# Patient Record
Sex: Male | Born: 1955
Health system: Southern US, Community
[De-identification: ages and names within clinical notes are randomized; demographics above are authoritative.]

## PROBLEM LIST (undated history)

## (undated) DIAGNOSIS — F419 Anxiety disorder, unspecified: Secondary | ICD-10-CM

## (undated) DIAGNOSIS — M5136 Other intervertebral disc degeneration, lumbar region: Secondary | ICD-10-CM

## (undated) DIAGNOSIS — I471 Supraventricular tachycardia, unspecified: Secondary | ICD-10-CM

## (undated) DIAGNOSIS — N183 Chronic kidney disease, stage 3 unspecified: Secondary | ICD-10-CM

## (undated) DIAGNOSIS — N19 Unspecified kidney failure: Secondary | ICD-10-CM

## (undated) DIAGNOSIS — E785 Hyperlipidemia, unspecified: Secondary | ICD-10-CM

## (undated) DIAGNOSIS — M5126 Other intervertebral disc displacement, lumbar region: Secondary | ICD-10-CM

## (undated) DIAGNOSIS — I255 Ischemic cardiomyopathy: Secondary | ICD-10-CM

## (undated) DIAGNOSIS — I472 Ventricular tachycardia, unspecified: Secondary | ICD-10-CM

## (undated) DIAGNOSIS — I5042 Chronic combined systolic (congestive) and diastolic (congestive) heart failure: Secondary | ICD-10-CM

## (undated) DIAGNOSIS — I82409 Acute embolism and thrombosis of unspecified deep veins of unspecified lower extremity: Secondary | ICD-10-CM

## (undated) DIAGNOSIS — I2510 Atherosclerotic heart disease of native coronary artery without angina pectoris: Secondary | ICD-10-CM

## (undated) DIAGNOSIS — M5489 Other dorsalgia: Secondary | ICD-10-CM

## (undated) DIAGNOSIS — R571 Hypovolemic shock: Secondary | ICD-10-CM

## (undated) DIAGNOSIS — M51369 Other intervertebral disc degeneration, lumbar region without mention of lumbar back pain or lower extremity pain: Secondary | ICD-10-CM

## (undated) DIAGNOSIS — M069 Rheumatoid arthritis, unspecified: Secondary | ICD-10-CM

## (undated) DIAGNOSIS — G4730 Sleep apnea, unspecified: Secondary | ICD-10-CM

## (undated) DIAGNOSIS — L0390 Cellulitis, unspecified: Secondary | ICD-10-CM

## (undated) DIAGNOSIS — M2550 Pain in unspecified joint: Secondary | ICD-10-CM

## (undated) DIAGNOSIS — L239 Allergic contact dermatitis, unspecified cause: Secondary | ICD-10-CM

## (undated) DIAGNOSIS — A938 Other specified arthropod-borne viral fevers: Secondary | ICD-10-CM

## (undated) DIAGNOSIS — L0291 Cutaneous abscess, unspecified: Secondary | ICD-10-CM

## (undated) DIAGNOSIS — Z95810 Presence of automatic (implantable) cardiac defibrillator: Secondary | ICD-10-CM

## (undated) DIAGNOSIS — A419 Sepsis, unspecified organism: Secondary | ICD-10-CM

## (undated) DIAGNOSIS — I219 Acute myocardial infarction, unspecified: Secondary | ICD-10-CM

## (undated) DIAGNOSIS — M79606 Pain in leg, unspecified: Secondary | ICD-10-CM

## (undated) DIAGNOSIS — G8929 Other chronic pain: Secondary | ICD-10-CM

## (undated) DIAGNOSIS — M5416 Radiculopathy, lumbar region: Secondary | ICD-10-CM

## (undated) DIAGNOSIS — Z8739 Personal history of other diseases of the musculoskeletal system and connective tissue: Secondary | ICD-10-CM

## (undated) DIAGNOSIS — G4734 Idiopathic sleep related nonobstructive alveolar hypoventilation: Secondary | ICD-10-CM

## (undated) DIAGNOSIS — I10 Essential (primary) hypertension: Secondary | ICD-10-CM

## (undated) DIAGNOSIS — M549 Dorsalgia, unspecified: Secondary | ICD-10-CM

## (undated) DIAGNOSIS — Z9289 Personal history of other medical treatment: Secondary | ICD-10-CM

## (undated) HISTORY — DX: Allergic contact dermatitis, unspecified cause: L23.9

## (undated) HISTORY — DX: Ventricular tachycardia, unspecified: I47.20

## (undated) HISTORY — DX: Pain in leg, unspecified: M79.606

## (undated) HISTORY — DX: Other dorsalgia: M54.89

## (undated) HISTORY — DX: Chronic combined systolic (congestive) and diastolic (congestive) heart failure: I50.42

## (undated) HISTORY — DX: Other chronic pain: G89.29

## (undated) HISTORY — DX: Unspecified kidney failure: N19

## (undated) HISTORY — DX: Pain in unspecified joint: M25.50

## (undated) HISTORY — PX: FRACTURE SURGERY: SHX138

## (undated) HISTORY — DX: Chronic kidney disease, stage 3 unspecified: N18.30

## (undated) HISTORY — PX: CHOLECYSTECTOMY OPEN: SUR202

## (undated) HISTORY — DX: Ischemic cardiomyopathy: I25.5

## (undated) HISTORY — DX: Ventricular tachycardia: I47.2

## (undated) HISTORY — DX: Chronic kidney disease, stage 3 (moderate): N18.3

## (undated) HISTORY — DX: Radiculopathy, lumbar region: M54.16

## (undated) HISTORY — DX: Sepsis, unspecified organism: A41.9

## (undated) HISTORY — PX: INGUINAL HERNIA REPAIR: SUR1180

## (undated) HISTORY — DX: Dorsalgia, unspecified: M54.9

## (undated) HISTORY — DX: Idiopathic sleep related nonobstructive alveolar hypoventilation: G47.34

## (undated) HISTORY — DX: Hypovolemic shock: R57.1

## (undated) HISTORY — DX: Other specified arthropod-borne viral fevers: A93.8

## (undated) HISTORY — DX: Hyperlipidemia, unspecified: E78.5

---

## 1984-08-03 DIAGNOSIS — Z9289 Personal history of other medical treatment: Secondary | ICD-10-CM

## 1984-08-03 HISTORY — PX: VASCULAR SURGERY: SHX849

## 1984-08-03 HISTORY — PX: TIBIA FRACTURE SURGERY: SHX806

## 1984-08-03 HISTORY — PX: SKIN GRAFT: SHX250

## 1984-08-03 HISTORY — DX: Personal history of other medical treatment: Z92.89

## 1984-08-03 HISTORY — PX: MANDIBLE FRACTURE SURGERY: SHX706

## 2003-08-04 DIAGNOSIS — N183 Chronic kidney disease, stage 3 unspecified: Secondary | ICD-10-CM | POA: Insufficient documentation

## 2003-11-09 DIAGNOSIS — J309 Allergic rhinitis, unspecified: Secondary | ICD-10-CM | POA: Insufficient documentation

## 2004-01-09 DIAGNOSIS — I10 Essential (primary) hypertension: Secondary | ICD-10-CM | POA: Insufficient documentation

## 2005-02-07 ENCOUNTER — Emergency Department: Payer: Self-pay | Admitting: Emergency Medicine

## 2005-02-08 ENCOUNTER — Emergency Department: Payer: Self-pay | Admitting: Internal Medicine

## 2006-11-06 ENCOUNTER — Inpatient Hospital Stay: Payer: Self-pay | Admitting: Internal Medicine

## 2008-02-20 DIAGNOSIS — E782 Mixed hyperlipidemia: Secondary | ICD-10-CM | POA: Insufficient documentation

## 2009-01-08 ENCOUNTER — Emergency Department: Payer: Self-pay | Admitting: Unknown Physician Specialty

## 2009-01-12 DIAGNOSIS — A938 Other specified arthropod-borne viral fevers: Secondary | ICD-10-CM

## 2009-01-12 DIAGNOSIS — A419 Sepsis, unspecified organism: Secondary | ICD-10-CM | POA: Insufficient documentation

## 2009-01-12 HISTORY — DX: Other specified arthropod-borne viral fevers: A93.8

## 2009-06-03 DIAGNOSIS — F411 Generalized anxiety disorder: Secondary | ICD-10-CM | POA: Insufficient documentation

## 2011-04-26 ENCOUNTER — Emergency Department: Payer: Self-pay | Admitting: Unknown Physician Specialty

## 2011-08-04 DIAGNOSIS — I219 Acute myocardial infarction, unspecified: Secondary | ICD-10-CM

## 2011-08-04 HISTORY — DX: Acute myocardial infarction, unspecified: I21.9

## 2011-08-04 HISTORY — PX: CORONARY ANGIOPLASTY WITH STENT PLACEMENT: SHX49

## 2011-08-10 ENCOUNTER — Encounter (HOSPITAL_COMMUNITY)
Admission: EM | Disposition: A | Discharge status: Home / Self Care | Payer: Self-pay | Source: Home / Self Care | Attending: Cardiology

## 2011-08-10 ENCOUNTER — Emergency Department (HOSPITAL_COMMUNITY): Payer: Self-pay

## 2011-08-10 ENCOUNTER — Other Ambulatory Visit: Payer: Self-pay

## 2011-08-10 ENCOUNTER — Encounter (HOSPITAL_COMMUNITY): Payer: Self-pay | Admitting: Nurse Practitioner

## 2011-08-10 ENCOUNTER — Inpatient Hospital Stay (HOSPITAL_COMMUNITY)
Admission: EM | Admit: 2011-08-10 | Discharge: 2011-08-14 | DRG: 249 | Disposition: A | Discharge status: Home / Self Care | Payer: 59 | Attending: Cardiology | Admitting: Cardiology

## 2011-08-10 DIAGNOSIS — I25810 Atherosclerosis of coronary artery bypass graft(s) without angina pectoris: Secondary | ICD-10-CM | POA: Insufficient documentation

## 2011-08-10 DIAGNOSIS — I2510 Atherosclerotic heart disease of native coronary artery without angina pectoris: Secondary | ICD-10-CM | POA: Diagnosis present

## 2011-08-10 DIAGNOSIS — I2119 ST elevation (STEMI) myocardial infarction involving other coronary artery of inferior wall: Secondary | ICD-10-CM | POA: Diagnosis present

## 2011-08-10 DIAGNOSIS — K219 Gastro-esophageal reflux disease without esophagitis: Secondary | ICD-10-CM

## 2011-08-10 DIAGNOSIS — N289 Disorder of kidney and ureter, unspecified: Secondary | ICD-10-CM | POA: Diagnosis present

## 2011-08-10 DIAGNOSIS — E7800 Pure hypercholesterolemia, unspecified: Secondary | ICD-10-CM | POA: Diagnosis present

## 2011-08-10 DIAGNOSIS — I10 Essential (primary) hypertension: Secondary | ICD-10-CM | POA: Diagnosis present

## 2011-08-10 DIAGNOSIS — I213 ST elevation (STEMI) myocardial infarction of unspecified site: Secondary | ICD-10-CM

## 2011-08-10 HISTORY — PX: LEFT HEART CATHETERIZATION WITH CORONARY ANGIOGRAM: SHX5451

## 2011-08-10 HISTORY — PX: PERCUTANEOUS CORONARY STENT INTERVENTION (PCI-S): SHX5485

## 2011-08-10 HISTORY — DX: Atherosclerotic heart disease of native coronary artery without angina pectoris: I25.10

## 2011-08-10 LAB — PROTIME-INR
INR: 0.92 (ref 0.00–1.49)
Prothrombin Time: 12.6 seconds (ref 11.6–15.2)
Prothrombin Time: 54.3 seconds — ABNORMAL HIGH (ref 11.6–15.2)

## 2011-08-10 LAB — POCT I-STAT, CHEM 8
BUN: 29 mg/dL — ABNORMAL HIGH (ref 6–23)
Calcium, Ion: 1.16 mmol/L (ref 1.12–1.32)
Chloride: 110 mEq/L (ref 96–112)
HCT: 48 % (ref 39.0–52.0)
Potassium: 6.6 mEq/L (ref 3.5–5.1)

## 2011-08-10 LAB — COMPREHENSIVE METABOLIC PANEL WITH GFR
AST: 48 U/L — ABNORMAL HIGH (ref 0–37)
Albumin: 4.2 g/dL (ref 3.5–5.2)
Chloride: 105 meq/L (ref 96–112)
Creatinine, Ser: 1.64 mg/dL — ABNORMAL HIGH (ref 0.50–1.35)
Total Bilirubin: 0.7 mg/dL (ref 0.3–1.2)
Total Protein: 7.8 g/dL (ref 6.0–8.3)

## 2011-08-10 LAB — COMPREHENSIVE METABOLIC PANEL
ALT: 26 U/L (ref 0–53)
ALT: 17 U/L (ref 0–53)
AST: 19 U/L (ref 0–37)
Albumin: 3.3 g/dL — ABNORMAL LOW (ref 3.5–5.2)
Alkaline Phosphatase: 68 U/L (ref 39–117)
Alkaline Phosphatase: 63 U/L (ref 39–117)
BUN: 18 mg/dL (ref 6–23)
CO2: 26 mEq/L (ref 19–32)
Calcium: 9.6 mg/dL (ref 8.4–10.5)
Chloride: 108 mEq/L (ref 96–112)
GFR calc Af Amer: 53 mL/min — ABNORMAL LOW (ref >90)
GFR calc non Af Amer: 46 mL/min — ABNORMAL LOW (ref >90)
Glucose, Bld: 95 mg/dL (ref 70–99)
Potassium: 6.4 mEq/L (ref 3.5–5.1)
Potassium: 3.8 mEq/L (ref 3.5–5.1)
Sodium: 141 mEq/L (ref 135–145)
Sodium: 141 mEq/L (ref 135–145)
Total Bilirubin: 0.6 mg/dL (ref 0.3–1.2)
Total Protein: 6.3 g/dL (ref 6.0–8.3)

## 2011-08-10 LAB — CBC
HCT: 44.8 % (ref 39.0–52.0)
HCT: 38.1 % — ABNORMAL LOW (ref 39.0–52.0)
Hemoglobin: 15.7 g/dL (ref 13.0–17.0)
MCH: 33.3 pg (ref 26.0–34.0)
MCHC: 35 g/dL (ref 30.0–36.0)
MCHC: 34.6 g/dL (ref 30.0–36.0)
MCV: 95.1 fL (ref 78.0–100.0)
Platelets: 182 K/uL (ref 150–400)
Platelets: 158 10*3/uL (ref 150–400)
RBC: 4.71 MIL/uL (ref 4.22–5.81)
RDW: 12.9 % (ref 11.5–15.5)
RDW: 12.8 % (ref 11.5–15.5)
WBC: 12.5 K/uL — ABNORMAL HIGH (ref 4.0–10.5)
WBC: 11.3 10*3/uL — ABNORMAL HIGH (ref 4.0–10.5)

## 2011-08-10 LAB — MRSA PCR SCREENING: MRSA by PCR: NEGATIVE

## 2011-08-10 LAB — POCT I-STAT TROPONIN I: Troponin i, poc: 0.1 ng/mL (ref 0.00–0.08)

## 2011-08-10 LAB — CARDIAC PANEL(CRET KIN+CKTOT+MB+TROPI)
Relative Index: 5.4 — ABNORMAL HIGH (ref 0.0–2.5)
Relative Index: 14.2 — ABNORMAL HIGH (ref 0.0–2.5)
Total CK: 141 U/L (ref 7–232)
Troponin I: 4.21 ng/mL (ref <0.30)

## 2011-08-10 LAB — APTT: aPTT: 32 s (ref 24–37)

## 2011-08-10 LAB — LIPID PANEL
HDL: 39 mg/dL — ABNORMAL LOW (ref >39)
LDL Cholesterol: 161 mg/dL — ABNORMAL HIGH (ref 0–99)
Total CHOL/HDL Ratio: 5.6 RATIO
Triglycerides: 95 mg/dL (ref <150)

## 2011-08-10 LAB — TSH: TSH: 1.52 u[IU]/mL (ref <=5.90)

## 2011-08-10 LAB — MAGNESIUM: Magnesium: 1.4 mg/dL — ABNORMAL LOW (ref 1.5–2.5)

## 2011-08-10 SURGERY — PERCUTANEOUS CORONARY STENT INTERVENTION (PCI-S)
Anesthesia: LOCAL

## 2011-08-10 MED ORDER — HYDROCODONE-ACETAMINOPHEN 5-325 MG PO TABS
ORAL_TABLET | ORAL | Status: AC
  Filled 2011-08-10: qty 1

## 2011-08-10 MED ORDER — HEPARIN SODIUM (PORCINE) 1000 UNIT/ML IJ SOLN
INTRAMUSCULAR | Status: AC
  Filled 2011-08-10: qty 1

## 2011-08-10 MED ORDER — DIAZEPAM 5 MG PO TABS: 5.0000 mg | ORAL_TABLET | Freq: Four times a day (QID) | ORAL | Status: DC | PRN

## 2011-08-10 MED ORDER — ASPIRIN 325 MG PO TABS: 325.0000 mg | ORAL_TABLET | Freq: Every day | ORAL | Status: DC

## 2011-08-10 MED ORDER — SODIUM CHLORIDE 0.9 % IV SOLN: INTRAVENOUS | Status: AC

## 2011-08-10 MED ORDER — SODIUM CHLORIDE 0.9 % IV SOLN: 30.0000 mL | INTRAVENOUS | Status: DC

## 2011-08-10 MED ORDER — BIVALIRUDIN 250 MG IV SOLR
INTRAVENOUS | Status: AC
  Filled 2011-08-10: qty 250

## 2011-08-10 MED ORDER — FENTANYL CITRATE 0.05 MG/ML IJ SOLN: 25.0000 ug | Freq: Once | INTRAMUSCULAR | Status: DC

## 2011-08-10 MED ORDER — ASPIRIN 81 MG PO CHEW
CHEWABLE_TABLET | ORAL | Status: AC
  Administered 2011-08-10: 325 mg via ORAL
  Filled 2011-08-10: qty 4

## 2011-08-10 MED ORDER — ZOLPIDEM TARTRATE 5 MG PO TABS: 10.0000 mg | ORAL_TABLET | Freq: Every evening | ORAL | Status: DC | PRN

## 2011-08-10 MED ORDER — FENTANYL CITRATE 0.05 MG/ML IJ SOLN
INTRAMUSCULAR | Status: AC
  Filled 2011-08-10: qty 2

## 2011-08-10 MED ORDER — NITROGLYCERIN 2 % TD OINT
TOPICAL_OINTMENT | TRANSDERMAL | Status: AC
  Filled 2011-08-10: qty 1

## 2011-08-10 MED ORDER — MIDAZOLAM HCL 2 MG/2ML IJ SOLN
INTRAMUSCULAR | Status: AC
  Filled 2011-08-10: qty 2

## 2011-08-10 MED ORDER — ALPRAZOLAM 0.25 MG PO TABS
0.2500 mg | ORAL_TABLET | Freq: Two times a day (BID) | ORAL | Status: DC | PRN
  Administered 2011-08-11 – 2011-08-12 (×3): 0.25 mg via ORAL
  Filled 2011-08-10 (×4): qty 1

## 2011-08-10 MED ORDER — NITROGLYCERIN 0.4 MG SL SUBL
0.4000 mg | SUBLINGUAL_TABLET | SUBLINGUAL | Status: DC | PRN
  Administered 2011-08-11 (×2): 0.4 mg via SUBLINGUAL
  Filled 2011-08-10: qty 50
  Filled 2011-08-10: qty 25

## 2011-08-10 MED ORDER — ONDANSETRON HCL 4 MG/2ML IJ SOLN
4.0000 mg | Freq: Four times a day (QID) | INTRAMUSCULAR | Status: DC | PRN
  Administered 2011-08-10: 4 mg via INTRAVENOUS
  Filled 2011-08-10: qty 2

## 2011-08-10 MED ORDER — VERAPAMIL HCL 2.5 MG/ML IV SOLN
INTRAVENOUS | Status: AC
  Filled 2011-08-10: qty 2

## 2011-08-10 MED ORDER — NITROGLYCERIN 0.4 MG SL SUBL
SUBLINGUAL_TABLET | SUBLINGUAL | Status: AC
  Administered 2011-08-11: 0.4 mg via SUBLINGUAL
  Filled 2011-08-10: qty 75

## 2011-08-10 MED ORDER — ZOLPIDEM TARTRATE 5 MG PO TABS
5.0000 mg | ORAL_TABLET | Freq: Every evening | ORAL | Status: DC | PRN
  Administered 2011-08-11 – 2011-08-12 (×3): 5 mg via ORAL
  Filled 2011-08-10 (×3): qty 1

## 2011-08-10 MED ORDER — ONDANSETRON HCL 4 MG/2ML IJ SOLN: 4.0000 mg | Freq: Four times a day (QID) | INTRAMUSCULAR | Status: DC | PRN

## 2011-08-10 MED ORDER — NITROGLYCERIN IN D5W 200-5 MCG/ML-% IV SOLN: 2.0000 ug/min | INTRAVENOUS | Status: DC

## 2011-08-10 MED ORDER — METOPROLOL TARTRATE 12.5 MG HALF TABLET
12.5000 mg | ORAL_TABLET | Freq: Two times a day (BID) | ORAL | Status: DC
  Administered 2011-08-10 – 2011-08-12 (×5): 12.5 mg via ORAL
  Filled 2011-08-10 (×7): qty 1

## 2011-08-10 MED ORDER — HYDROCODONE-ACETAMINOPHEN 5-325 MG PO TABS
2.0000 | ORAL_TABLET | Freq: Four times a day (QID) | ORAL | Status: DC | PRN
  Administered 2011-08-10: 1 via ORAL
  Administered 2011-08-11 – 2011-08-12 (×6): 2 via ORAL
  Filled 2011-08-10 (×6): qty 2

## 2011-08-10 MED ORDER — ROSUVASTATIN CALCIUM 40 MG PO TABS
40.0000 mg | ORAL_TABLET | Freq: Every day | ORAL | Status: DC
  Administered 2011-08-10 – 2011-08-14 (×5): 40 mg via ORAL
  Filled 2011-08-10 (×5): qty 1

## 2011-08-10 MED ORDER — ACETAMINOPHEN 325 MG PO TABS: 650.0000 mg | ORAL_TABLET | ORAL | Status: DC | PRN

## 2011-08-10 MED ORDER — HEPARIN SODIUM (PORCINE) 5000 UNIT/ML IJ SOLN
INTRAMUSCULAR | Status: AC
  Administered 2011-08-10: 4000 [IU]
  Filled 2011-08-10: qty 1

## 2011-08-10 MED ORDER — SODIUM CHLORIDE 0.9 % IV SOLN: 250.0000 mL | INTRAVENOUS | Status: DC | PRN

## 2011-08-10 MED ORDER — HEPARIN SODIUM (PORCINE) 5000 UNIT/ML IJ SOLN: 60.0000 [IU]/kg | INTRAMUSCULAR | Status: DC

## 2011-08-10 MED ORDER — HEPARIN (PORCINE) IN NACL 2-0.9 UNIT/ML-% IJ SOLN
INTRAMUSCULAR | Status: AC
  Filled 2011-08-10: qty 2000

## 2011-08-10 MED ORDER — PRASUGREL HCL 10 MG PO TABS
ORAL_TABLET | ORAL | Status: AC
  Filled 2011-08-10: qty 6

## 2011-08-10 MED ORDER — LIDOCAINE HCL (PF) 1 % IJ SOLN
INTRAMUSCULAR | Status: AC
  Filled 2011-08-10: qty 30

## 2011-08-10 MED ORDER — PRASUGREL HCL 10 MG PO TABS
10.0000 mg | ORAL_TABLET | Freq: Every day | ORAL | Status: DC
  Administered 2011-08-11 – 2011-08-14 (×4): 10 mg via ORAL
  Filled 2011-08-10 (×4): qty 1

## 2011-08-10 MED ORDER — ASPIRIN EC 81 MG PO TBEC: 81.0000 mg | DELAYED_RELEASE_TABLET | Freq: Every day | ORAL | Status: DC

## 2011-08-10 MED ORDER — SODIUM CHLORIDE 0.9 % IJ SOLN: 3.0000 mL | Freq: Two times a day (BID) | INTRAMUSCULAR | Status: DC

## 2011-08-10 MED ORDER — ALPRAZOLAM 0.25 MG PO TABS: 0.2500 mg | ORAL_TABLET | Freq: Four times a day (QID) | ORAL | Status: DC | PRN

## 2011-08-10 MED ORDER — DIAZEPAM 5 MG PO TABS
ORAL_TABLET | ORAL | Status: AC
  Filled 2011-08-10: qty 1

## 2011-08-10 MED ORDER — SODIUM CHLORIDE 0.9 % IJ SOLN: 3.0000 mL | INTRAMUSCULAR | Status: DC | PRN

## 2011-08-10 MED ORDER — ASPIRIN 81 MG PO CHEW
81.0000 mg | CHEWABLE_TABLET | Freq: Every day | ORAL | Status: DC
  Administered 2011-08-10: 325 mg via ORAL
  Administered 2011-08-12 – 2011-08-14 (×3): 81 mg via ORAL
  Filled 2011-08-10 (×4): qty 1

## 2011-08-10 MED ORDER — ONDANSETRON HCL 4 MG/2ML IJ SOLN: 4.0000 mg | Freq: Once | INTRAMUSCULAR | Status: DC

## 2011-08-10 NOTE — ED Notes (Signed)
Pt reports onset of mid sternal chest pain radiating into left shoulder approx 1.5hrs prior to arrival associated with nausea. Pt reports he was hiking when pain started. Pt reports pain to be a 10/10 on arrival to room. Pt reports he had chest pain a couple nights ago that went away on its own. Pt to room at 1450. Cardiology and cath lab RN to bedside at 1451. Pt to cath lab at 1457. zoll pads placed on pt on arrival to cath lab. Pt alert and oriented x 3. Pulses present throughout.

## 2011-08-10 NOTE — ED Notes (Signed)
Pt here with CP while hiking with nausea; pt called STEMI to [facility]

## 2011-08-10 NOTE — Progress Notes (Signed)
Chaplain's Note:  Responded at 1455 to Code STEMI page.  Pt in treatment rm.  Offered pastoral presence to staff.  Pt was brought in by a friend, Shai [phone]) [phone] cell.  Located friend in ss waiting.  Friend said pt didn't ask him to call anyone else at the time.  Friend will come back in a while to check on pt.  No other family present.  Advised Cath LAb staff of status of family/ friends as known to chaplain.  Will follow-up as needed or requested.

## 2011-08-10 NOTE — Op Note (Signed)
Cardiac Catheterization Procedure Note  Name: Richard Cook MRN: [id number] DOB: 08/14/1955  Procedure: Left Heart Cath, Selective Coronary Angiography, LV angiography, PTCA and stenting of the RCA.  Indication:  This gentleman presented to the emergency room with onset of SSCP and diagnostic ECG for inferior MI.  Urgent reperfusion therapy was recommended.  History and exam were reviewed.    Procedural Details:  The right wrist was prepped, draped, and anesthetized with 1% lidocaine. Using the modified Seldinger technique, a 5 French sheath was introduced into the right radial artery. 3 mg of verapamil was administered through the sheath, weight-based unfractionated heparin was already administered intravenously as part of the MI protocol. . Standard Judkins catheters were used for selective coronary angiography. Catheter exchanges were performed over an exchange length guidewire.  Following this, preparations were made for reperfusion.  A JR 4 guiding catheter with sideholes was utilized.  Bivalirudin was given according to protocol.  We rechecked his ISTAT, and Hgb was stable, Cr was elevated, and K was normal.  Prasugrel was given according to protocol.  ACT was checked and was appropriate.  Pre-dilatation was performed with a 2.5 mm compliant balloon, then aspiration was performed secondary to large thrombus burden.  Following this, the vessel was stented with a 4.0 by 16mm Veriflex stent to 13 atm.  The vessel was opened widely, but there was "squeegy" of thrombus.  Post dilatation was done with a 4.5 non compliant balloon to moderately high pressures.  There was again some embolization, and no residual visible thrombus.  There was a small PL branch with occlusion, and this was "dottered" with a wire and IC verapamil 3mg  given.  We were cognizant of contrast load, and we measured LV pressures, and did a hand injection into the LV.  Final angios of the vessel were widely patent.  There was  contrast hangup in the PL branch distally.  He was taken to the holding area in satisfactory condition.    PROCEDURAL FINDINGS Hemodynamics: AO 161/105 (131) LV 151/30   Coronary angiography: Coronary dominance: right  Left mainstem: The LMCA has smooth, distal 50% segmental plaque.  It appears to be adequate to maintain flow.  Left anterior descending (LAD): The LAD courses to the apex.  There is about 50% narrowing after the major diagonal, but then is free of disease beyond.  The diagonal is large, and has  50% ostial and 60% mid before bifurcation.    Left circumflex (LCx): The CFX provides to marginals and a PL, all fairly large in caliber.  Other than minor irregularities, it is free of critical disease.    Right coronary artery (RCA): The RCA is the infarct related artery.  Initially, it is a large vessel with a 95% ruptured plaque with thrombus in the proximal mid junction.  There is heavy thrombus burden.  The initial PDA and PLA both have distal stumps consistent with embolized large thrombus burden.  Following balloon, aspiration, and stenting, the proximal vessel is widely patent with no residual thrombus.  The PDA is improved.  The large PL has a very distal stump due to embolization, and the smaller branch of the PLA also has this distally.  Both are compatible with distal embolization both prior to PCI and post stenting.    Left ventriculography: Left ventricular systolic function is normal, LVEF is estimated at 55-65%.  This was done with a hand injection, so there was inadequate opacification.    PCI Data: Vessel - RCA/Segment - 2 Percent  Stenosis (pre)  99 TIMI-flow 2 Stent Veriflex 4.0 by 20 post dil to 4.5 Percent Stenosis (post) 10 TIMI-flow (post) 3  Final Conclusions:   1.  Acute inferior MI due to ruptured plaque prox RCA with successful stenting.  There was distal embolization prior to PCI and some residual after PCI 2.  50% smooth distal LMCA, and 50-70%  diagonal disease, 40-50% LAD 3.  Preserved LV function.   Recommendations:  1.  ASA, Prasugrel, Statins, b-blockers 2.  Aggressive lipid reduction 3.  Cardiac rehab 4.  BP control.   Bing Quarry, MD, [facility], [place] 5:07 PM 08/10/2011  08/10/2011, 4:46 PM
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
## 2011-08-10 NOTE — ED Provider Notes (Signed)
History     CSN: [id number]  Arrival date & time 08/10/11  1434   First MD Initiated Contact with Patient 08/10/11 1439      Chief Complaint  Patient presents with  . Chest Pain  . Code STEMI    (Consider location/radiation/quality/duration/timing/severity/associated sxs/prior treatment) HPI Comments: Level 5 caveat due to condition of patient.  Patient developed anterior chest pain not radiating approximately 1.5 hours ago while hiking. He has no prior history of cardiac symptoms. Symptoms were associated with nausea. He reports slight shortness of breath but no sweating. He does not smoke. He does take Vicodin chronically for knee pain. Otherwise he has not taken any medications prior to arrival. He denies any vomiting. He is allergic to codeine.  Patient is a 56 y.o. male presenting with chest pain. The history is provided by the patient.  Chest Pain The chest pain began 1 - 2 hours ago.     No past medical history on file.  No past surgical history on file.  No family history on file.  History  Substance Use Topics  . Smoking status: Not on file  . Smokeless tobacco: Not on file  . Alcohol Use: Not on file      Review of Systems  Unable to perform ROS: Other  Cardiovascular: Positive for chest pain.    Allergies  Review of patient's allergies indicates not on file.  Home Medications  No current outpatient prescriptions on file.  BP 167/114  Pulse 71  Resp 20  SpO2 100%  Physical Exam  Constitutional: He appears well-developed and well-nourished. He appears distressed.  HENT:  Head: Normocephalic and atraumatic.  Eyes: Pupils are equal, round, and reactive to light.  Neck: Neck supple.  Cardiovascular: Normal rate and regular rhythm.   No murmur heard. Pulmonary/Chest: Effort normal. No respiratory distress. He has no wheezes.  Abdominal: Soft. He exhibits no distension. There is no tenderness.  Musculoskeletal: He exhibits no edema.  Neurological:  He is alert.  Skin: Skin is warm and dry.    ED Course  Procedures (including critical care time)  CRITICAL CARE Performed by: Donzetta Matters.   Total critical care time: 30 min  Critical care time was exclusive of separately billable procedures and treating other patients.  Critical care was necessary to treat or prevent imminent or life-threatening deterioration.  Critical care was time spent personally by me on the following activities: development of treatment plan with patient and/or surrogate as well as nursing, discussions with consultants, evaluation of patient's response to treatment, examination of patient, obtaining history from patient or surrogate, ordering and performing treatments and interventions, ordering and review of laboratory studies, ordering and review of radiographic studies, pulse oximetry and re-evaluation of patient's condition.    Labs Reviewed  I-STAT TROPONIN I  CBC  COMPREHENSIVE METABOLIC PANEL  I-STAT, CHEM 8  PROTIME-INR  APTT   No results found.   No diagnosis found.  RA sat is 100% and is normal  ECG at time 14:31 shows ST elevation in inferior leads and laterally, NSR at rate 71, normal axis.    MDM  Code STEMI initiated.  Pt given protocol IVF', ASA, heparin bolus.  Dr. Lia Foyer and [facility] team responded shortly afterwards to see pt.          Saddie Benders. Lora Chavers, MD 08/10/11 1446

## 2011-08-11 ENCOUNTER — Other Ambulatory Visit: Payer: Self-pay

## 2011-08-11 DIAGNOSIS — I2510 Atherosclerotic heart disease of native coronary artery without angina pectoris: Secondary | ICD-10-CM

## 2011-08-11 DIAGNOSIS — E7800 Pure hypercholesterolemia, unspecified: Secondary | ICD-10-CM

## 2011-08-11 DIAGNOSIS — I2119 ST elevation (STEMI) myocardial infarction involving other coronary artery of inferior wall: Secondary | ICD-10-CM

## 2011-08-11 DIAGNOSIS — I219 Acute myocardial infarction, unspecified: Secondary | ICD-10-CM

## 2011-08-11 LAB — POCT I-STAT, CHEM 8
BUN: 19 mg/dL (ref 6–23)
Calcium, Ion: 1.21 mmol/L (ref 1.12–1.32)
Chloride: 111 mEq/L (ref 96–112)
Glucose, Bld: 113 mg/dL — ABNORMAL HIGH (ref 70–99)
TCO2: 23 mmol/L (ref 0–100)

## 2011-08-11 LAB — CBC
HCT: 37.5 % — ABNORMAL LOW (ref 39.0–52.0)
MCV: 95.7 fL (ref 78.0–100.0)
RBC: 3.92 MIL/uL — ABNORMAL LOW (ref 4.22–5.81)
RDW: 13 % (ref 11.5–15.5)
WBC: 8.9 10*3/uL (ref 4.0–10.5)

## 2011-08-11 LAB — CARDIAC PANEL(CRET KIN+CKTOT+MB+TROPI)
CK, MB: 340.8 ng/mL (ref 0.3–4.0)
Relative Index: 17.7 — ABNORMAL HIGH (ref 0.0–2.5)
Total CK: 1930 U/L — ABNORMAL HIGH (ref 7–232)
Troponin I: 25 ng/mL (ref <0.30)
Troponin I: >25 ng/mL (ref <0.30)

## 2011-08-11 LAB — BASIC METABOLIC PANEL
BUN: 15 mg/dL (ref 6–23)
CO2: 27 mEq/L (ref 19–32)
Chloride: 106 mEq/L (ref 96–112)
Creatinine, Ser: 1.5 mg/dL — ABNORMAL HIGH (ref 0.50–1.35)
GFR calc Af Amer: 59 mL/min — ABNORMAL LOW (ref >90)
Potassium: 4 mEq/L (ref 3.5–5.1)

## 2011-08-11 LAB — HEMOGLOBIN A1C: Hgb A1c MFr Bld: 5.6 % (ref <5.7)

## 2011-08-11 LAB — POCT ACTIVATED CLOTTING TIME: Activated Clotting Time: 468 seconds

## 2011-08-11 LAB — LIPID PANEL
HDL: 35 mg/dL — ABNORMAL LOW (ref >39)
LDL Cholesterol: 134 mg/dL — ABNORMAL HIGH (ref 0–99)
Total CHOL/HDL Ratio: 6 RATIO
Triglycerides: 207 mg/dL — ABNORMAL HIGH (ref <150)

## 2011-08-11 MED FILL — Dextrose Inj 5%: INTRAVENOUS | Qty: 50 | Status: AC

## 2011-08-11 NOTE — Progress Notes (Signed)
Pt had CP around 12. Still feels tightness in chest, although sts no "pain" now. Assisted pt to chair. Began MI teaching. 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 Encouraged to discuss plan with MD. [place], ACSM

## 2011-08-11 NOTE — Progress Notes (Signed)
Subjective:  Doing well.  No chest pain.  Sitting up reading the paper.    Objective:  Vital Signs in the last 24 hours: Temp:  [97.5 F (36.4 C)-99.5 F (37.5 C)] 97.5 F (36.4 C) (01/08 0813) Pulse Rate:  [57-132] 59  (01/08 0700) Resp:  [8-20] 8  (01/08 0700) BP: (106-167)/(65-114) 114/65 mmHg (01/08 0700) SpO2:  [97 %-100 %] 100 % (01/08 0700) Weight:  [86.183 kg (190 lb)] 190 lb (86.183 kg) (01/07 1510)  Intake/Output from previous day: 01/07 0701 - 01/08 0700 In: 1174 [P.O.:240; I.V.:934] Out: 475 [Urine:475]   Physical Exam: General: Well developed, well nourished, in no acute distress. Head:  Normocephalic and atraumatic. Lungs: Clear to auscultation and percussion. Heart: Normal S1 and S2.  No murmur, rubs or gallops.  Pulses: R radial looks great.  Excellent pulse.  Hand warm.  No hematoma Extremities: No clubbing or cyanosis. No edema. Neurologic: Alert and oriented x 3.    Lab Results:  Basename 08/11/11 0155 08/10/11 1522  WBC 8.9 11.3*  HGB 12.7* 13.2  PLT 165 158    Basename 08/11/11 0155 08/10/11 1522  NA 140 141  K 4.0 3.8  CL 106 108  CO2 27 23  GLUCOSE 113* 108*  BUN 15 19  CREATININE 1.50* 1.60*    Basename 08/11/11 0155 08/10/11 2028  TROPONINI 25.00* 4.21*   Hepatic Function Panel  Basename 08/10/11 1522  PROT 6.3  ALBUMIN 3.3*  AST 19  ALT 17  ALKPHOS 63  BILITOT 0.6  BILIDIR --  IBILI --    Basename 08/11/11 0155  CHOL 210*   No results found for this basename: PROTIME in the last 72 hours  Imaging: No results found.  EKG:  Evolving inferior MI  Cardiac Studies:  See enzymes data.   Assessment/Plan:  Inferior MI--stable.  Enzymes elevated, consistent with vessel size and thrombus burden.  Clinically better. Hypertension--improved. Hypercholesterolemia--add statin.    Plan transfer to floor probably in am.         Bing Quarry, MD, [facility], [facility] 08/11/2011, 8:37 AM

## 2011-08-11 NOTE — Progress Notes (Signed)
  Echocardiogram 2D Echocardiogram has been performed.  Ursula Dermody Lynn Amara [place]ang 08/11/2011, 9:08 AM

## 2011-08-12 NOTE — Progress Notes (Signed)
   CARE MANAGEMENT NOTE 08/12/2011  Patient:  Richard Cook, Richard Cook   Account Number:  [id number]  Date Initiated:  08/12/2011  Documentation initiated by:  GRAVES-BIGELOW,Laurel Harnden  Subjective/Objective Assessment:   Pt Works on [employer] (between [place]) 7 months out of the year with [employer] camps.  Lives in [place] other 5 months of the year.  Post LHC. Plan for home on effient. No insurance listed.  Will place 30 day free card in chart.     Action/Plan:   MD please wrtie rx for 30 day free and no refills along with original rx with refills. Pt assistance forms are in chart to be filled out. Pt was asleep and CM was unable to speak to him to verify information. He does have a PCP in [place].   Anticipated DC Date:  08/15/2011   Anticipated DC Plan:  [place]  CM consult      Choice offered to / List presented to:             Status of service:  Completed, signed off Medicare Important Message given?   (If response is "NO", the following Medicare IM given date fields will be blank) Date Medicare IM given:   Date Additional Medicare IM given:    Discharge Disposition:  HOME/SELF CARE  Per UR Regulation:    Comments:  08-12-11 1628 Jacqlyn Krauss, RN,BSN [phone] Please make sure at d/c that all meds are generic if possible to keep cost down. Will continue to moniotr for any additional needs.

## 2011-08-12 NOTE — Progress Notes (Signed)
Pt has telemetry orders; awaiting bed; will check VS q4 and as necessary

## 2011-08-12 NOTE — Progress Notes (Signed)
Patient hypotensive, systolic 99991111. Md notified, per NTG drip order. NTG to be turned off for the time being per Dr. Lia Foyer. Patient asymptomatic.

## 2011-08-12 NOTE — Progress Notes (Signed)
Subjective:  Doing well.  No chest pain since yesterday.  Feels pretty good today.    Objective:  Vital Signs in the last 24 hours: Temp:  [98.3 F (36.8 C)-100.7 F (38.2 C)] 98.3 F (36.8 C) (01/09 0730) Pulse Rate:  [63-101] 101  (01/08 2245) Resp:  [9-18] 16  (01/09 0730) BP: (116-147)/(78-91) 137/84 mmHg (01/09 0340) SpO2:  [91 %-100 %] 94 % (01/09 0730)  Intake/Output from previous day: 01/08 0701 - 01/09 0700 In: 390 [P.O.:345; I.V.:45] Out: 1475 [Urine:1475]   Physical Exam: General: Well developed, well nourished, in no acute distress. Head:  Normocephalic and atraumatic. Lungs: Clear to auscultation and percussion. Heart: Normal S1 and S2.  No murmur, rubs or gallops.  Pulses: Pulses normal in all 4 extremities. Extremities: No clubbing or cyanosis. No edema.  Wrist stable.  Good pulses Neurologic: Alert and oriented x 3.    Lab Results:  Basename 08/11/11 0155 08/10/11 1522  WBC 8.9 11.3*  HGB 12.7* 13.2  PLT 165 158    Basename 08/11/11 0155 08/10/11 1522  NA 140 141  K 4.0 3.8  CL 106 108  CO2 27 23  GLUCOSE 113* 108*  BUN 15 19  CREATININE 1.50* 1.60*    Basename 08/11/11 0900 08/11/11 0155  TROPONINI >25.00* 25.00*   Hepatic Function Panel  Basename 08/10/11 1522  PROT 6.3  ALBUMIN 3.3*  AST 19  ALT 17  ALKPHOS 63  BILITOT 0.6  BILIDIR --  IBILI --    Basename 08/11/11 0155  CHOL 210*   No results found for this basename: PROTIME in the last 72 hours  Imaging: No results found.  EKG:  Evolving inferolateral MI with slight persistent STE in V5, V6 consistent with distal embolization prior to PCI.  See original angios  Cardiac Studies:  Echo with EF 45%, WMA in expected regioni.  Assessment/Plan:  Patient Active Hospital Problem List: CAD in native artery ()   Assessment: residual CAD   Plan: aggressive medical therapy Inferior MI (08/11/2011)   Assessment: improving status.  NSVT yesterday, none since   Plan: transfer to  floor, increase beta blockers  Hypercholesterolemia (08/11/2011)   Assessment: treat agressively   Plan: as above      Bing Quarry, MD, [facility], [place] 08/12/2011, 8:59 AM

## 2011-08-12 NOTE — Progress Notes (Signed)
CARDIAC REHAB PHASE I   PRE:  Rate/Rhythm: 81 SR    BP: sitting 117/90    SaO2:   MODE:  Ambulation: 390 ft   POST:  Rate/Rhythm: 93 SR    BP: sitting 127/94     SaO2:   Tolerated well although DBP elevated. Ed completed. Pt sts he plans to stay here 4 days in motel then drive to [place] to stay with sister. Pt unable to do CRPII due to no real place of residence. [id number]  Darrick Meigs CES, ACSM

## 2011-08-13 DIAGNOSIS — I252 Old myocardial infarction: Secondary | ICD-10-CM | POA: Insufficient documentation

## 2011-08-13 LAB — BASIC METABOLIC PANEL
BUN: 20 mg/dL (ref 6–23)
CO2: 27 mEq/L (ref 19–32)
Calcium: 9.4 mg/dL (ref 8.4–10.5)
Chloride: 99 mEq/L (ref 96–112)
Creatinine, Ser: 1.73 mg/dL — ABNORMAL HIGH (ref 0.50–1.35)
Glucose, Bld: 76 mg/dL (ref 70–99)

## 2011-08-13 MED ORDER — ZOLPIDEM TARTRATE 5 MG PO TABS
10.0000 mg | ORAL_TABLET | Freq: Every evening | ORAL | Status: DC | PRN
  Administered 2011-08-13: 10 mg via ORAL
  Administered 2011-08-13: 5 mg via ORAL
  Filled 2011-08-13: qty 1
  Filled 2011-08-13: qty 2

## 2011-08-13 MED ORDER — METOPROLOL SUCCINATE 12.5 MG HALF TABLET
12.5000 mg | ORAL_TABLET | Freq: Every day | ORAL | Status: DC
  Administered 2011-08-13 – 2011-08-14 (×2): 12.5 mg via ORAL
  Filled 2011-08-13 (×2): qty 1

## 2011-08-13 MED ORDER — PANTOPRAZOLE SODIUM 40 MG PO TBEC
40.0000 mg | DELAYED_RELEASE_TABLET | Freq: Every day | ORAL | Status: DC
  Administered 2011-08-13 – 2011-08-14 (×2): 40 mg via ORAL
  Filled 2011-08-13 (×2): qty 1

## 2011-08-13 MED ORDER — ZOLPIDEM TARTRATE 5 MG PO TABS: 5.0000 mg | ORAL_TABLET | Freq: Once | ORAL | Status: DC

## 2011-08-13 NOTE — Progress Notes (Signed)
Pt walking independently without problems. All questions answered. Concerned [id number]. Will sign off as all needs met. Yves Dill CES, ACSM

## 2011-08-13 NOTE — Progress Notes (Addendum)
Patient Name: Richard Cook Date of Encounter: 08/13/2011     Principal Problem:  *Inferior MI Active Problems:  CAD in native artery  Hypercholesterolemia    SUBJECTIVE:  Has had intermittent 5/10 chest discomfort/soreness.  Occurs almost exclusively while lying flat on his back in bed and while eating.  Improved by sitting up and ambulation.  No c/p with ambulation during the day yesterday.   CURRENT MEDS    . aspirin  81 mg Oral Daily  . metoprolol tartrate  12.5 mg Oral BID  . prasugrel  10 mg Oral Daily  . rosuvastatin  40 mg Oral Daily  . DISCONTD: zolpidem  5 mg Oral Once    OBJECTIVE  Filed Vitals:   08/12/11 1539 08/12/11 2000 08/13/11 0000 08/13/11 0400  BP: 95/54 114/79 121/81 125/85  Pulse: 88     Temp: 98.9 F (37.2 C) 98.9 F (37.2 C) 99.7 F (37.6 C) 98.4 F (36.9 C)  TempSrc: Oral Oral Oral Oral  Resp: 16 20 16 20   Height:      Weight:      SpO2: 93%  96% 93%    Intake/Output Summary (Last 24 hours) at 08/13/11 0650 Last data filed at 08/12/11 1200  Gross per 24 hour  Intake    340 ml  Output      0 ml  Net    340 ml    PHYSICAL EXAM  General: Well developed, well nourished, in no acute distress. Head: Normocephalic, atraumatic, sclera non-icteric, no xanthomas, nares are without discharge.  Neck: Supple without bruits or JVD. Lungs:  Resp regular and unlabored, CTA. Heart: RRR no s3, s4, or murmurs. Abdomen: Soft, non-tender, non-distended, BS + x 4.  Msk:  Strength and tone appears normal for age. Extremities: No clubbing, cyanosis or edema. DP/PT/Radials 2+ and equal bilaterally.  R radial site w/o bleeding/bruit/hematoma. Neuro: Alert and oriented X 3. Moves all extremities spontaneously. Psych: Normal affect.  LABS:  CBC:  Basename 08/11/11 0155 08/10/11 1522  WBC 8.9 11.3*  NEUTROABS -- --  HGB 12.7* 13.2  HCT 37.5* 38.1*  MCV 95.7 94.1  PLT 165 0000000   Basic Metabolic Panel:  Basename 08/11/11 0155 08/10/11  2028 08/10/11 1522  NA 140 -- 141  K 4.0 -- 3.8  CL 106 -- 108  CO2 27 -- 23  GLUCOSE 113* -- 108*  BUN 15 -- 19  CREATININE 1.50* -- 1.60*  CALCIUM 9.3 -- 8.6  MG -- 1.4* --  PHOS -- -- --   Liver Function Tests:  Basename 08/10/11 1522 08/10/11 1439  AST 19 48*  ALT 17 26  ALKPHOS 63 68  BILITOT 0.6 0.7  PROT 6.3 7.8  ALBUMIN 3.3* 4.2   Cardiac Enzymes:  Basename 08/11/11 0900 08/11/11 0155 08/10/11 2028  CKTOTAL 2209* 1930* 706*  CKMB 389.2* 340.8* 100.4*  CKMBINDEX -- -- --  TROPONINI >25.00* 25.00* 4.21*   Hemoglobin A1C:  Basename 08/10/11 1522  HGBA1C 5.6   Fasting Lipid Panel:  Basename 08/11/11 0155  CHOL 210*  HDL 35*  LDLCALC 134*  TRIG 207*  CHOLHDL 6.0  LDLDIRECT --   Thyroid Function Tests:  Basename 08/10/11 2028  TSH 1.524  T4TOTAL --  T3FREE --  THYROIDAB --   TELE  RSR   Radiology/Studies:  2D echo Study Conclusions  - Left ventricle: Technically limited. There is hypokinesis of the mid inferolateral wall and the mid lateral wall. I am not sure about the anterior wall. The  EF seems to be in the 45% range. - Right atrium: The atrium was mildly dilated.  ASSESSMENT AND PLAN:  1.  Acute inflat STEMI/CAD:  S/p pci/bms to RCA on 1/7.  He has been ambulating w/o difficulty though reports intermittent chest discomfort, which sounds gerd-like in nature (worse with lying flat and eating).  Will add protonix.  Cont asa, effient, bb, statin.  With reduced EF, switch bb to toprol xl.  BP has been soft thus hold off on acei/arb for now.  2.  ICM:  Ef 45%.  Euvolemic.  As above, switch bb to XL.  No acei/arb @ this time 2/2 borderline bp's (yesterday's am dose of bb was held 2/2 low bp).  3.  HL:  LDL 134.  Cont high dose statin.  4.  Renal insufficiency:  ? Chronicity.  Present on admission (after a 10 mile hike).  F/u bmet this am to ensure stability.  Holding off on acei/arb as above.  5.  Dispo:  Ambulating w/o difficulty but still  c/o intermittent chest discomfort.  Add PPI to see if this helps.  CE have been trending down and ECG yesterday was w/o acute changes.  Possible D/C later today vs tomorrow.  As for f/u, pt spends most of the year along the [place].  He's agreed to follow-up with [place] in [place] in the short term and plans to seek Cardiology care in the [place] area @ some point.    Signed, Murray Hodgkins NP  Patient seen and examined.  His pain only occurs with laying down.  Overall doing well.  Exam ok.  Driving and long term treatment discussed. Exam ok.  Will plan discharge tomorrow.  No driving for two weeks, return to work at week three.  Continue DAPT at present.    Bing Quarry 9:50 AM 08/13/2011

## 2011-08-14 ENCOUNTER — Inpatient Hospital Stay (HOSPITAL_COMMUNITY): Payer: Self-pay

## 2011-08-14 ENCOUNTER — Other Ambulatory Visit: Payer: Self-pay

## 2011-08-14 DIAGNOSIS — K219 Gastro-esophageal reflux disease without esophagitis: Secondary | ICD-10-CM

## 2011-08-14 MED ORDER — ROSUVASTATIN CALCIUM 40 MG PO TABS: 40.0000 mg | ORAL_TABLET | Freq: Every day | ORAL | Status: DC

## 2011-08-14 MED ORDER — METOPROLOL SUCCINATE 12.5 MG HALF TABLET: 12.5000 mg | ORAL_TABLET | Freq: Every day | ORAL | Status: DC

## 2011-08-14 MED ORDER — PANTOPRAZOLE SODIUM 40 MG PO TBEC: 40.0000 mg | DELAYED_RELEASE_TABLET | Freq: Every day | ORAL | Status: DC

## 2011-08-14 MED ORDER — NITROGLYCERIN 0.4 MG SL SUBL: 0.4000 mg | SUBLINGUAL_TABLET | SUBLINGUAL | Status: DC | PRN

## 2011-08-14 MED ORDER — PRASUGREL HCL 10 MG PO TABS: 10.0000 mg | ORAL_TABLET | Freq: Every day | ORAL | Status: DC

## 2011-08-14 MED ORDER — ASPIRIN 81 MG PO CHEW: 81.0000 mg | CHEWABLE_TABLET | Freq: Every day | ORAL | Status: AC

## 2011-08-14 NOTE — Progress Notes (Addendum)
08/14/2011 11:46 AM Nursing Note: Discharge AVS form, follow up appointments, effeint assist card and paperwork given and explained to patient as well as medications already taken today. RX explained. D/c iv. D/c tele. D/c home per orders. Questions and concerns addressed.  Rawley Harju, [employer]   Correct use of SL NTG also reviewed. Questions addressed.  Berthe Oley, Arville Lime

## 2011-08-14 NOTE — Progress Notes (Signed)
   CARE MANAGEMENT NOTE 08/14/2011  Patient:  Richard Cook, Richard Cook   Account Number:  [id number]  Date Initiated:  08/12/2011  Documentation initiated by:  GRAVES-BIGELOW,Curtistine Pettitt  Subjective/Objective Assessment:   Pt Works on [employer] (between [place]) 7 months out of the year with [employer].  Lives in [place] other 5 months of the year.  Post LHC. Plan for home on effient. No insurance listed.  Will place 30 day free card in chart.     Action/Plan:   MD please wrtie rx for 30 day free and no refills along with original rx with refills. Pt assistance forms are in chart to be filled out. Pt was asleep and CM was unable to speak to him to verify information. He does have a PCP in [place].   Anticipated DC Date:  08/15/2011   Anticipated DC Plan:  [place]  CM consult      Choice offered to / List presented to:             Status of service:  Completed, signed off Medicare Important Message given?   (If response is "NO", the following Medicare IM given date fields will be blank) Date Medicare IM given:   Date Additional Medicare IM given:    Discharge Disposition:  HOME/SELF CARE  Per UR Regulation:    Comments:  08-14-11 [address] Jacqlyn Krauss, RN,BSN [phone] CM spoke to RN and pt had already been d/c. Effient form was already in chart and no signature was obtained. CC printed off new form and pt was to take it by the office and get signed and to fax it in to see if eligible for patient assist. CM relayed this information to [place] PA and he called the offfice to see if they would be able to assist. CM also relayed that pt may not be able to afford crestor due to cost and that he would proboably need a cheaper statin to be compliant with medication regimen.  08-12-11 1628 Jacqlyn Krauss, RN,BSN [phone] Please make sure at d/c that all meds are generic if possible to keep cost down. Will continue to moniotr for any additional  needs.

## 2011-08-14 NOTE — Discharge Summary (Signed)
Discharge Summary   Patient ID: Richard Cook,  MRN: [id number], DOB/AGE: Jan 09, 1956 56 y.o.  Admit date: 08/10/2011 Discharge date: 08/14/2011  Discharge Diagnoses Principal Problem:  *Inferior MI Active Problems:  CAD in native artery  Hypercholesterolemia  GERD (gastroesophageal reflux disease)   Allergies Allergies  Allergen Reactions  . Codeine     Procedures  Cardiac Catheterization  Hemodynamics:  AO 161/105 (131)  LV 151/30  Coronary angiography:  Coronary dominance: right  Left mainstem: The LMCA has smooth, distal 50% segmental plaque. It appears to be adequate to maintain flow.  Left anterior descending (LAD): The LAD courses to the apex. There is about 50% narrowing after the major diagonal, but then is free of disease beyond. The diagonal is large, and has 50% ostial and 60% mid before bifurcation.  Left circumflex (LCx): The CFX provides to marginals and a PL, all fairly large in caliber. Other than minor irregularities, it is free of critical disease.  Right coronary artery (RCA): The RCA is the infarct related artery. Initially, it is a large vessel with a 95% ruptured plaque with thrombus in the proximal mid junction. There is heavy thrombus burden. The initial PDA and PLA both have distal stumps consistent with embolized large thrombus burden. Following balloon, aspiration, and stenting, the proximal vessel is widely patent with no residual thrombus. The PDA is improved. The large PL has a very distal stump due to embolization, and the smaller branch of the PLA also has this distally. Both are compatible with distal embolization both prior to PCI and post stenting.  Left ventriculography: Left ventricular systolic function is normal, LVEF is estimated at 55-65%. This was done with a hand injection, so there was inadequate opacification.  PCI Data:  Vessel - RCA/Segment - 2  Percent Stenosis (pre) 99  TIMI-flow 2  Stent Veriflex 4.0 by 20 post dil to  4.5  Percent Stenosis (post) 10  TIMI-flow (post) 3  Final Conclusions:  1. Acute inferior MI due to ruptured plaque prox RCA with successful stenting. There was distal embolization prior to PCI and some residual after PCI  2. 50% smooth distal LMCA, and 50-70% diagonal disease, 40-50% LAD  3. Preserved LV function.  Recommendations:  1. ASA, Prasugrel, Statins, b-blockers  2. Aggressive lipid reduction  3. Cardiac rehab  4. BP control.   2D echocardiogram  Study Conclusions  - Left ventricle: Technically limited. There is hypokinesis of the mid inferolateral wall and the mid lateral wall. I am not sure about the anterior wall. The EF seems to be in the 45% range. - Right atrium: The atrium was mildly dilated. Transthoracic echocardiography. M-mode, complete 2D, spectral Doppler, and color Doppler. Height: Height: 177.8cm. Height: 70in. Weight: Weight: 86.2kg. Weight: 189.6lb. Body mass index: BMI: 27.3kg/m^2. Body surface area: BSA: 2.39m^2. Blood pressure: 114/65. Patient status: Inpatient. Location: Bedside. ------------------------------------------------------------ ------------------------------------------------------------ Left ventricle: Technically limited. There is hypokinesis of the mid inferolateral wall and the mid lateral wall. I am not sure about the anterior wall. The EF seems to be in the 45% range. ------------------------------------------------------------ Aortic valve: Structurally normal valve. Cusp separation was normal. Doppler: Transvalvular velocity was within the normal range. There was no stenosis. No regurgitation. ------------------------------------------------------------ Aorta: Aortic root: The aortic root was normal in size.  ------------------------------------------------------------ Mitral valve: Structurally normal valve. Leaflet separation was normal. Doppler: Transvalvular velocity was within the normal range. There was no evidence for  stenosis. No regurgitation. ------------------------------------------------------------ Left atrium: The atrium was normal in size.  ------------------------------------------------------------ Right  ventricle: The cavity size was normal. Systolic function was normal. ------------------------------------------------------------ Pulmonic valve: Poorly visualized. Doppler: No significant regurgitation. ------------------------------------------------------------ Tricuspid valve: The valve appears to be grossly normal. Doppler: Trivial regurgitation. Peak gradient: 66mm Hg (D).  ------------------------------------------------------------ Right atrium: The atrium was mildly dilated. ------------------------------------------------------------ Pericardium: There was no pericardial effusion.  History of Present Illness  Richard Cook is a 56 yo male without prior cardiac history or known PMHx who underwent emergent PCI 01/07 after being found to have acute inferolateral STEMI.   He was in his usoh until a few weeks ago when he awoke suddenly one night with sscp. Ss persisted about 30mins and resolved spontaneously. He did not seek medical attention.   The morning of 01/07, pt hiked 33miles - which is not unusual for him - and on the last mile he began to feel severe, 8/10 sscp and dyspnea. He decided to have a friend drive him to the ED. Pt arrived in ED approx 65mins after onset of Ss. ECG showed ST elevation in II, III, aVF, V4-V6. Pt cont to c/o 10/10 sscp. He has been hemodynamically stable. He has been treated with ASA 81mg  x 4 and Heparin 4K unit bolus. He has been taken to Cath lab for emergent cath +/- PCI.  Hospital Course   He was informed, consented and agreed to the procedure which revealed the above results including rupture proximal RVA plaque s/p successful BMS deployment to this lesion, with some distal embolization prior to PCI, residual post-PCI, 50% smooth distal LMCA, and  50-70% diagonal disease, 40-50% LAD and preserved LV function. He tolerated the procedure well without complications. The recommendation was made to initiate cardiac meds (ASA/Effient/BB/statin).  He was transferred to CCU.   He remained stable with symptom improvement during the admission. He was transferred to telemetry. Efforts were made to maximize BP and lipid control. Echo was ordered, and found to be technically limited revealing the above details including hypokinesis of the mid inferolateral wall and mid lateral wall, EF ~45% and mildly dilated RA. ACEI/ARB were held secondary to soft BPs. He did have an episode of NSVT, but was asymptomatic and hemodynamically stable with this. He was mildly hypotensive on NTG IV, this was discontinued with BP improvement. His CEs trended down. No acute events on telemetry or EKG. He mentioned mild chest discomfort on ambulation and aggravated on laying down. He was started on a PPI with some improvement.   Today he is stable, at baseline and will be discharged home. In terms of follow-up, he spends most of the year along the [place]. He's aggred to follow-up at the [place] office in [place] in the short term and plans to seek Cardiology care in the [place] area down the road. A follow-up appointment has been made for him later this month, he will be discharged on his current cardiac meds. Specific discharge instructions regarding activity level and timeframe in which to resume work have been outlined and will be reviewed with him.    Note: CXR performed prior to discharge revealed the following:   Findings: The heart size and vascularity are normal. Minimal atelectasis at both lung bases laterally. Vague nodular density at the right base laterally probably represents atelectasis but I recommend follow-up radiograph to exclude a persistent nodule.  No osseous abnormality.   IMPRESSION:  Minimal bibasilar atelectasis. Vague nodular density at the  right base needs follow-up chest x-ray.  Discharge Vitals:  Blood pressure 111/73, pulse 88, temperature 99 F (37.2 C), temperature  source Oral, resp. rate 19, height 5\' 10"  (1.778 m), weight 89.1 kg (196 lb 6.9 oz), SpO2 97.00%.   Labs:  Lab 08/13/11 0812 08/11/11 0155 08/10/11 1522  NA 138 140 141  K 4.3 4.0 3.8  CL 99 106 108  CO2 27 27 23   BUN 20 15 19   CREATININE 1.73* 1.50* 1.60*  CALCIUM 9.4 9.3 8.6  PROT -- -- 6.3  BILITOT -- -- 0.6  ALKPHOS -- -- 63  ALT -- -- 17  AST -- -- 19  AMYLASE -- -- --  LIPASE -- -- --  GLUCOSE 76 113* 108*    Disposition:  Discharge Orders    Future Appointments: Provider: Department: Dept Phone: Center:   09/02/2011 9:30 AM Liliane Shi, [place] [phone] [id number]     Follow-up Information    Follow up with Richardson Dopp, PA on 09/02/2011. (At 9:30 AM. You will see Richardson Dopp for Dr. Lia Foyer.)    Contact information:   [id number] [place] [phone]          Discharge Medications:  Medication List  As of 08/14/2011 10:07 AM   START taking these medications         aspirin 81 MG chewable tablet   Chew 1 tablet (81 mg total) by mouth daily.      metoprolol succinate 12.5 mg Tb24   Commonly known as: TOPROL-XL   Take 0.5 tablets (12.5 mg total) by mouth daily.      nitroGLYCERIN 0.4 MG SL tablet   Commonly known as: NITROSTAT   Place 1 tablet (0.4 mg total) under the tongue every 5 (five) minutes as needed for chest pain.      pantoprazole 40 MG tablet   Commonly known as: PROTONIX   Take 1 tablet (40 mg total) by mouth daily at 6 (six) AM.      prasugrel 10 MG Tabs   Commonly known as: EFFIENT   Take 1 tablet (10 mg total) by mouth daily.      rosuvastatin 40 MG tablet   Commonly known as: CRESTOR   Take 1 tablet (40 mg total) by mouth daily.         CONTINUE taking these medications         HYDROcodone-acetaminophen 5-500 MG per tablet    Commonly known as: VICODIN          Where to get your medications    These are the prescriptions that you need to pick up.   You may get these medications from any pharmacy.         aspirin 81 MG chewable tablet   metoprolol succinate 12.5 mg Tb24   nitroGLYCERIN 0.4 MG SL tablet   pantoprazole 40 MG tablet   prasugrel 10 MG Tabs   rosuvastatin 40 MG tablet           Outstanding Labs/Studies: None  Duration of Discharge Encounter: 40 minutes including physician time.  Signed, R. Valeria Batman, PA-C 08/14/2011, 10:07 AM

## 2011-08-14 NOTE — Progress Notes (Signed)
Subjective:  Doing well.  No chest pain.  Ambulating without difficulty.  Wants to go home.   Objective:  Vital Signs in the last 24 hours: Temp:  [98.7 F (37.1 C)-99 F (37.2 C)] 99 F (37.2 C) (01/11 0500) Pulse Rate:  [84-104] 88  (01/11 0500) Resp:  [19] 19  (01/11 0500) BP: (106-128)/(73-88) 111/73 mmHg (01/11 0500) SpO2:  [95 %-97 %] 97 % (01/11 0500) Weight:  [89.1 kg (196 lb 6.9 oz)] 89.1 kg (196 lb 6.9 oz) (01/11 0500)  Intake/Output from previous day: 01/10 0701 - 01/11 0700 In: 1380 [P.O.:1380] Out: 1250 [Urine:1250]   Physical Exam: General: Well developed, well nourished, in no acute distress. Head:  Normocephalic and atraumatic. Lungs: Clear to auscultation and percussion. Heart: Normal S1 and S2.  No murmur, rubs or gallops.  Pulses: Pulses normal in all 4 extremities. Extremities: No clubbing or cyanosis. No edema. Neurologic: Alert and oriented x 3.    Lab Results: No results found for this basename: WBC:2,HGB:2,PLT:2 in the last 72 hours  Basename 08/13/11 0812  NA 138  K 4.3  CL 99  CO2 27  GLUCOSE 76  BUN 20  CREATININE 1.73*    Basename 08/11/11 0900  TROPONINI >25.00*   Hepatic Function Panel No results found for this basename: PROT,ALBUMIN,AST,ALT,ALKPHOS,BILITOT,BILIDIR,IBILI in the last 72 hours No results found for this basename: CHOL in the last 72 hours No results found for this basename: PROTIME in the last 72 hours  Imaging: No results found.  EKG:  Inferolateral T wave inversion with mild persistent STE.  No change from baseline.   Cardiac Studies:    Assessment/Plan:  Patient Active Hospital Problem List: Inferior MI (08/11/2011)   Assessment: Making progress.  Ambulating   Plan: Home today.  Instructions reviewed in detail. No driving for two weeks.  Drink plenty of fluids.  Follow up with [place] in two weeks.  Dual antiplatelet therapy necessity.    CKD, II   Cr 1.7.  Continue as at present.  Encourage fluids.  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 Hyperlipidemia   Continue statins.  PA and LAT CXR before dc.  Overall DC time greater than thirty minutes.     Bing Quarry, MD, [facility], [place] 08/14/2011, 8:31 AM

## 2011-08-17 ENCOUNTER — Telehealth: Payer: Self-pay | Admitting: Cardiology

## 2011-08-17 DIAGNOSIS — R911 Solitary pulmonary nodule: Secondary | ICD-10-CM

## 2011-08-17 NOTE — Telephone Encounter (Signed)
Left message for pt to callback.  Form was just given to me today and Dr Lia Foyer is out this week (MD must sign).

## 2011-08-17 NOTE — Telephone Encounter (Signed)
New Msg: Pt calling wanting to know if MD has filled out [facility] Patient Assistance Program paperwork. Please return pt call to discuss further.

## 2011-08-17 NOTE — Telephone Encounter (Signed)
Walk in Pt Form " Pt Needs forms Filled Out/[place] Cares Assistance paper" sent to [facility]  08/17/11/KM

## 2011-08-18 NOTE — Discharge Summary (Signed)
Patient seen that day and reviewed.  Home today with specific instructions given by me.  TS

## 2011-08-27 ENCOUNTER — Telehealth: Payer: Self-pay | Admitting: Cardiology

## 2011-08-27 NOTE — Telephone Encounter (Signed)
I spoke with the pt and he has an upcoming appointment on 09/02/11 and would like samples.  MD part of form will be faxed to [place].  I also made the pt aware of abnormal chest x-ray in the hospital and Dr Lia Foyer would like the pt to have a CT of chest to evaluate lung nodule.  The pt would like to have this done after his appointment with Richardson Dopp PA-C.  I spoke with Rose in CT and scheduled CT at 10:30 on 09/02/11.

## 2011-08-27 NOTE — Telephone Encounter (Signed)
New Msg: pt calling c/o having difficulty going to sleep with new medcaiton MD put pt on following hospital D/C. Pt said while in hospital pt was given ambien to assit with sleeping however upon d/c pt was not written rx for ambien. Pt would like to know if MD would call in a rx of ambien into [facility] on [address].   Please return pt call to discuss further.

## 2011-08-27 NOTE — Telephone Encounter (Signed)
I spoke with the pt and instructed him to contact his PCP for further evaluation and treatment of insomnia. Pt agreed with plan.

## 2011-09-01 ENCOUNTER — Encounter: Payer: Self-pay | Admitting: Physician Assistant

## 2011-09-02 ENCOUNTER — Ambulatory Visit (HOSPITAL_COMMUNITY)
Admission: RE | Admit: 2011-09-02 | Discharge: 2011-09-02 | Disposition: A | Discharge status: Home / Self Care | Payer: Self-pay | Source: Ambulatory Visit | Attending: Cardiology | Admitting: Cardiology

## 2011-09-02 ENCOUNTER — Ambulatory Visit (INDEPENDENT_AMBULATORY_CARE_PROVIDER_SITE_OTHER): Payer: 59 | Admitting: Physician Assistant

## 2011-09-02 ENCOUNTER — Ambulatory Visit
Admission: RE | Admit: 2011-09-02 | Discharge: 2011-09-02 | Disposition: A | Discharge status: Home / Self Care | Payer: 59 | Source: Ambulatory Visit | Attending: Cardiology | Admitting: Cardiology

## 2011-09-02 ENCOUNTER — Telehealth: Payer: Self-pay | Admitting: *Deleted

## 2011-09-02 ENCOUNTER — Encounter: Payer: Self-pay | Admitting: *Deleted

## 2011-09-02 ENCOUNTER — Encounter: Payer: Self-pay | Admitting: Physician Assistant

## 2011-09-02 DIAGNOSIS — J984 Other disorders of lung: Secondary | ICD-10-CM | POA: Insufficient documentation

## 2011-09-02 DIAGNOSIS — I2510 Atherosclerotic heart disease of native coronary artery without angina pectoris: Secondary | ICD-10-CM

## 2011-09-02 DIAGNOSIS — E875 Hyperkalemia: Secondary | ICD-10-CM

## 2011-09-02 DIAGNOSIS — R911 Solitary pulmonary nodule: Secondary | ICD-10-CM

## 2011-09-02 DIAGNOSIS — E7800 Pure hypercholesterolemia, unspecified: Secondary | ICD-10-CM

## 2011-09-02 DIAGNOSIS — N289 Disorder of kidney and ureter, unspecified: Secondary | ICD-10-CM

## 2011-09-02 LAB — BASIC METABOLIC PANEL
BUN: 31 mg/dL — ABNORMAL HIGH (ref 6–23)
CO2: 27 mEq/L (ref 19–32)
Calcium: 9.7 mg/dL (ref 8.4–10.5)
Creatinine, Ser: 2 mg/dL — ABNORMAL HIGH (ref 0.4–1.5)
GFR: 36.22 mL/min — ABNORMAL LOW (ref >60.00)
Glucose, Bld: 96 mg/dL (ref 70–99)
Sodium: 144 mEq/L (ref 135–145)

## 2011-09-02 MED ORDER — ATORVASTATIN CALCIUM 80 MG PO TABS: 80.0000 mg | ORAL_TABLET | Freq: Every day | ORAL | Status: DC

## 2011-09-02 MED ORDER — METOPROLOL TARTRATE 25 MG PO TABS: 12.5000 mg | ORAL_TABLET | Freq: Two times a day (BID) | ORAL | Status: DC

## 2011-09-02 MED ORDER — FAMOTIDINE 20 MG PO TABS: 20.0000 mg | ORAL_TABLET | Freq: Two times a day (BID) | ORAL | Status: DC | PRN

## 2011-09-02 MED ORDER — SODIUM POLYSTYRENE SULFONATE 15 GM/60ML PO SUSP: 15.0000 g | Freq: Once | ORAL | Status: AC

## 2011-09-02 NOTE — Assessment & Plan Note (Signed)
Patient's K+ returned at 6.1.  I talked to the patient by phone.  BUN and creatinine are a little higher too.  I advised him to drink plenty of fluids.  He was given Kayexalate 15 gm to take x 1 tonight.  Repeat BMET in am.  If hyperkalemia continues to be a problem, he may need referral to nephrology.

## 2011-09-02 NOTE — Assessment & Plan Note (Signed)
As noted, he has trouble with finances.  He has samples of Crestor.  He will finish what he has of this.  I have given him a prescription for atorvastatin 80 mg daily.  Hopefully, he will be able to afford this.  Check lipids and LFTs in 6 weeks.
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
## 2011-09-02 NOTE — Assessment & Plan Note (Signed)
He denies ever really having any problem with this.  He denies any history of GI bleeding.  I will give him a prescription for Pepcid 20 mg twice a day to use p.r.n.  If this is not helpful, he will go back to Protonix.
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
## 2011-09-02 NOTE — Patient Instructions (Addendum)
Your physician recommends that you schedule a follow-up appointment in: [place] LIVER/LIPID PANEL ON THE SAME DAY AS APPT WITH DR.  Your physician recommends that you return for lab work in: TODAY BMET 414.01 CAD  Your physician has recommended you make the following change in your medication: STOP PROTONIX AND START PEPCID 20 MG 1 TAB TWICE DAILY AS NEEDED CALL AND LET us KNOW IF THE PEPCID IS WORKING OR NOT;  FINISH CRESTOR AND START ATORVASTATIN 80 MG 1 TAB DAILY @ BEDTIME CALL AND LET us KNOW IF THE COST IS TOO EXPENSIVE  FINISH TOPROL XL AND THEN START METOPROLOL TARTRATE 25 MG TABLET AND TAKE 1/2 TABLET TWICE DAILY   A chest x-ray DX 414.01 CAD AND RIGHT BASE NODULE takes a picture of the organs and structures inside the chest, including the heart, lungs, and blood vessels. This test can show several things, including, whether the heart is enlarges; whether fluid is building up in the lungs; and whether pacemaker / defibrillator leads are still in place.  WORK NOTE GIVEN TODAY
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
## 2011-09-02 NOTE — Assessment & Plan Note (Addendum)
Doing well post MI.  We discussed the importance of dual antiplatelet therapy.  He is receiving assistance for Effient.  He is not interested in cardiac rehabilitation.  He will continue to increase activity on his own.  I have given him a note to return to work (half days at first for 2 weeks, then full days, no heavy lifting 40 pounds or more for 6-8 weeks).  He has trouble with finances.  Change Toprol to metoprolol tartrate 25 mg 1/2 bid ($4 at [facility]).  Follow up with Dr.  Bing Quarry in 6 weeks.

## 2011-09-03 ENCOUNTER — Telehealth: Payer: Self-pay | Admitting: Cardiology

## 2011-09-03 ENCOUNTER — Other Ambulatory Visit (INDEPENDENT_AMBULATORY_CARE_PROVIDER_SITE_OTHER): Payer: 59 | Admitting: *Deleted

## 2011-09-03 ENCOUNTER — Encounter: Payer: Self-pay | Admitting: Cardiology

## 2011-09-03 DIAGNOSIS — E875 Hyperkalemia: Secondary | ICD-10-CM

## 2011-09-03 NOTE — Telephone Encounter (Signed)
Pt needs note faxed to his work to his attn stating he needs to come in for lab work today so he can leave work to come here

## 2011-09-04 ENCOUNTER — Telehealth: Payer: Self-pay | Admitting: *Deleted

## 2011-09-04 DIAGNOSIS — N289 Disorder of kidney and ureter, unspecified: Secondary | ICD-10-CM

## 2011-09-04 LAB — BASIC METABOLIC PANEL
Calcium: 9.1 mg/dL (ref 8.4–10.5)
GFR: 39.32 mL/min — ABNORMAL LOW (ref >60.00)
Potassium: 4.5 mEq/L (ref 3.5–5.1)
Sodium: 139 mEq/L (ref 135–145)

## 2011-09-04 NOTE — Telephone Encounter (Signed)
pt notified of lab results and went over K+ rich foods to avoid right now, pt said he will come in on his day off 09/14/11 for repeat lab, appt made today. Richard Cook

## 2011-09-10 ENCOUNTER — Other Ambulatory Visit (INDEPENDENT_AMBULATORY_CARE_PROVIDER_SITE_OTHER): Payer: Self-pay | Admitting: *Deleted

## 2011-09-10 DIAGNOSIS — I2510 Atherosclerotic heart disease of native coronary artery without angina pectoris: Secondary | ICD-10-CM

## 2011-09-10 DIAGNOSIS — N289 Disorder of kidney and ureter, unspecified: Secondary | ICD-10-CM

## 2011-09-10 DIAGNOSIS — E7800 Pure hypercholesterolemia, unspecified: Secondary | ICD-10-CM

## 2011-09-11 LAB — BASIC METABOLIC PANEL
CO2: 26 mEq/L (ref 19–32)
Chloride: 110 mEq/L (ref 96–112)
Sodium: 142 mEq/L (ref 135–145)

## 2011-09-11 LAB — LIPID PANEL
Cholesterol: 118 mg/dL (ref 0–200)
HDL: 38.6 mg/dL — ABNORMAL LOW (ref >39.00)
LDL Cholesterol: 50 mg/dL (ref 0–99)
Total CHOL/HDL Ratio: 3
Triglycerides: 149 mg/dL (ref 0.0–149.0)

## 2011-09-11 LAB — HEPATIC FUNCTION PANEL
AST: 23 U/L (ref 0–37)
Albumin: 4.2 g/dL (ref 3.5–5.2)
Alkaline Phosphatase: 65 U/L (ref 39–117)
Total Protein: 7.3 g/dL (ref 6.0–8.3)

## 2011-09-14 ENCOUNTER — Other Ambulatory Visit: Payer: 59 | Admitting: *Deleted

## 2011-09-17 ENCOUNTER — Telehealth: Payer: Self-pay

## 2011-09-17 NOTE — Telephone Encounter (Signed)
I spoke with the pt and made him aware that Effient had arrived in the office and was placed at the front desk for pick-up. Order [id number]

## 2011-10-07 ENCOUNTER — Other Ambulatory Visit: Payer: Self-pay

## 2011-10-07 ENCOUNTER — Ambulatory Visit: Payer: 59 | Admitting: Cardiology

## 2011-10-19 ENCOUNTER — Encounter: Payer: Self-pay | Admitting: Cardiology

## 2011-10-19 ENCOUNTER — Ambulatory Visit (INDEPENDENT_AMBULATORY_CARE_PROVIDER_SITE_OTHER): Payer: Self-pay | Admitting: Cardiology

## 2011-10-19 VITALS — BP 110/84 | HR 60 | Ht 70.0 in | Wt 197.0 lb

## 2011-10-19 DIAGNOSIS — E7800 Pure hypercholesterolemia, unspecified: Secondary | ICD-10-CM

## 2011-10-19 DIAGNOSIS — E875 Hyperkalemia: Secondary | ICD-10-CM

## 2011-10-19 DIAGNOSIS — N289 Disorder of kidney and ureter, unspecified: Secondary | ICD-10-CM

## 2011-10-19 DIAGNOSIS — I2510 Atherosclerotic heart disease of native coronary artery without angina pectoris: Secondary | ICD-10-CM

## 2011-10-19 NOTE — Patient Instructions (Signed)
Your physician has requested that you have an exercise tolerance test in 1-2 MONTHS.  For further information please visit [email or web address]. Please also follow instruction sheet, as given.  Your physician recommends that you continue on your current medications as directed. Please refer to the Current Medication list given to you today.
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
## 2011-11-08 NOTE — Assessment & Plan Note (Signed)
Cr is elevated at 1.8.  Etiology of this is not known.  When I see him back we will delve into this more.  He needs an [place] and encouraged him to drink fluids.  After his GXT we will refer him also to renal for long term management.

## 2011-11-08 NOTE — Assessment & Plan Note (Signed)
LDL was at target in follow up, and we will check again in about two months.

## 2011-11-08 NOTE — Assessment & Plan Note (Signed)
He is doing well from a cardiac standpoint.  We will bring him back in the near future for an exercise tolerance test to assess his overall functional status, and then make recommendations regarding his medications and treatment plans.

## 2011-11-08 NOTE — Assessment & Plan Note (Signed)
Last K was normal.

## 2011-12-07 ENCOUNTER — Ambulatory Visit (INDEPENDENT_AMBULATORY_CARE_PROVIDER_SITE_OTHER): Payer: Self-pay | Admitting: Cardiology

## 2011-12-07 ENCOUNTER — Encounter: Payer: Self-pay | Admitting: Cardiology

## 2011-12-07 DIAGNOSIS — I2510 Atherosclerotic heart disease of native coronary artery without angina pectoris: Secondary | ICD-10-CM

## 2011-12-07 NOTE — Procedures (Signed)
Exercise Treadmill Test  Pre-Exercise Testing Evaluation Rhythm: sinus bradycardia  Rate: 51   PR:  .19 QRS:  .07  QT:  .42 QTc: .38     Test  Exercise Tolerance Test Ordering MD: Bing Quarry, MD  Interpreting MD: Bing Quarry, MD  Unique Test No: 1  Treadmill:  1  Indication for ETT: known ASHD  Contraindication to ETT: No   Stress Modality: exercise - treadmill  Cardiac Imaging Performed: non   Protocol: standard Bruce - maximal  Max BP:  167//69  Technical issue at peak  Max MPHR (bpm):  165 85% MPR (bpm):  140  MPHR obtained (bpm):  150 % MPHR obtained:  90  Reached 85% MPHR (min:sec):  7:32 Total Exercise Time (min-sec):  9  Workload in METS:  10 Borg Scale: 15  Reason ETT Terminated:  fatigue    ST Segment Analysis At Rest: non-specific ST segment slurring With Exercise: no evidence of significant ST depression  Other Information Arrhythmia:  No Angina during ETT:  absent (0) Quality of ETT:  diagnostic  ETT Interpretation:  normal - no evidence of ischemia by ST analysis  Comments: Patient exercised for 9 minutes on the Bruce protocol with no chest pain.  He tolerated this well.  He had no angina.  There was resting T wave inversion but no ST change with rest or exercise.  There were bigeminal PVCs in recovery.  The study does not suggest ischemia at this workload.   Recommendations: Continue to exercise regularly.  He is hiking with an 8-10 lb pack on a regular basis.  He is having problems with his legs which he has had cellulitis in the past.  He does feel fatigued and is interested in applying for disability.  I reviewed his meds again and he was sure he had not taken lipitor as he got more samples from his primary MD of Crestor.

## 2011-12-07 NOTE — Progress Notes (Signed)
Addended by: Barkley Boards on: 12/07/2011 03:21 PM   Modules accepted: Orders

## 2011-12-07 NOTE — Patient Instructions (Signed)
Please call the office with your current list of medications [phone]).  Your physician recommends that you schedule a follow-up appointment in: 2 MONTHS with Dr Lia Foyer

## 2012-02-15 ENCOUNTER — Ambulatory Visit: Payer: Self-pay | Admitting: Cardiology

## 2012-03-10 ENCOUNTER — Telehealth: Payer: Self-pay | Admitting: Cardiology

## 2012-03-10 NOTE — Telephone Encounter (Signed)
BP 140/90 TAKEN YESTERDAY, DIZZY AND LIGHTHEADED, HAS PA APPT 8-12  PLS [facility] [phone]

## 2012-03-10 NOTE — Telephone Encounter (Signed)
I spoke with the patient. He states that for the past 2 weeks he has not felt well in general. He reports he has 1 nonfunctioning kidney. Over the past 4 days he has had nausea and face flushing. He also reports a flare up in his arthritis inflammation and he is having pain in his right side. He states this feels worse when he gets in the car and the seat touches his side. He also complains of feeling "whoozy" or dizzy headed. He checked his BP at a pharmacy this week and his reading was 140/90. He reports that Dr. Caryn Section (his PCP) follows his kidneys. I explained to the patient that his BP may be driven by a pain response from his arthritis or he may have some type of infectious process going on. I am not certain his symptoms are strickly blood pressure related. I have advised that he contact his PCP for follow up. He will keep his previously scheduled appointment with our PA on Monday. He voices understanding.

## 2012-03-14 ENCOUNTER — Ambulatory Visit: Payer: Self-pay | Admitting: Physician Assistant

## 2012-05-02 LAB — BASIC METABOLIC PANEL
BUN: 25 mg/dL — AB (ref 4–21)
Creatinine: 1.7 mg/dL — AB (ref 0.6–1.3)
Glucose: 83 mg/dL
Sodium: 143 mmol/L (ref 137–147)

## 2012-08-03 HISTORY — PX: CORONARY ARTERY BYPASS GRAFT: SHX141

## 2012-08-03 HISTORY — PX: CARDIAC CATHETERIZATION: SHX172

## 2012-09-16 ENCOUNTER — Other Ambulatory Visit: Payer: Self-pay | Admitting: Physician Assistant

## 2013-03-03 DIAGNOSIS — L0291 Cutaneous abscess, unspecified: Secondary | ICD-10-CM

## 2013-03-03 HISTORY — DX: Cutaneous abscess, unspecified: L02.91

## 2013-03-04 ENCOUNTER — Emergency Department (HOSPITAL_COMMUNITY)
Admission: EM | Admit: 2013-03-04 | Discharge: 2013-03-05 | Disposition: A | Discharge status: Home Health | Payer: Self-pay | Attending: Emergency Medicine | Admitting: Emergency Medicine

## 2013-03-04 ENCOUNTER — Encounter (HOSPITAL_COMMUNITY): Payer: Self-pay | Admitting: *Deleted

## 2013-03-04 DIAGNOSIS — L539 Erythematous condition, unspecified: Secondary | ICD-10-CM | POA: Insufficient documentation

## 2013-03-04 DIAGNOSIS — Z87448 Personal history of other diseases of urinary system: Secondary | ICD-10-CM | POA: Insufficient documentation

## 2013-03-04 DIAGNOSIS — E785 Hyperlipidemia, unspecified: Secondary | ICD-10-CM | POA: Insufficient documentation

## 2013-03-04 DIAGNOSIS — Z79899 Other long term (current) drug therapy: Secondary | ICD-10-CM | POA: Insufficient documentation

## 2013-03-04 DIAGNOSIS — M7989 Other specified soft tissue disorders: Secondary | ICD-10-CM | POA: Insufficient documentation

## 2013-03-04 DIAGNOSIS — I2510 Atherosclerotic heart disease of native coronary artery without angina pectoris: Secondary | ICD-10-CM | POA: Insufficient documentation

## 2013-03-04 LAB — CBC WITH DIFFERENTIAL/PLATELET
Basophils Absolute: 0 10*3/uL (ref 0.0–0.1)
Basophils Relative: 0 % (ref 0–1)
HCT: 37.1 % — ABNORMAL LOW (ref 39.0–52.0)
Hemoglobin: 11.9 g/dL — ABNORMAL LOW (ref 13.0–17.0)
Lymphocytes Relative: 15 % (ref 12–46)
MCHC: 32.1 g/dL (ref 30.0–36.0)
Monocytes Absolute: 0.7 10*3/uL (ref 0.1–1.0)
Neutro Abs: 5.2 10*3/uL (ref 1.7–7.7)
Neutrophils Relative %: 71 % (ref 43–77)
RDW: 12.9 % (ref 11.5–15.5)
WBC: 7.3 10*3/uL (ref 4.0–10.5)
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
## 2013-03-04 NOTE — ED Notes (Signed)
Pt c/o redness, swelling, and warmth behind left knee. Hx: blood clots.

## 2013-03-04 NOTE — ED Notes (Signed)
Pt states that he has had blood clot in right leg after his heart surgery and that this feels the exact same way

## 2013-03-04 NOTE — ED Notes (Signed)
Provider at bedside

## 2013-03-05 ENCOUNTER — Ambulatory Visit (HOSPITAL_COMMUNITY)
Admission: RE | Admit: 2013-03-05 | Discharge: 2013-03-05 | Disposition: A | Discharge status: Home / Self Care | Payer: Self-pay | Source: Ambulatory Visit | Attending: Emergency Medicine | Admitting: Emergency Medicine

## 2013-03-05 DIAGNOSIS — M7989 Other specified soft tissue disorders: Secondary | ICD-10-CM

## 2013-03-05 DIAGNOSIS — M79609 Pain in unspecified limb: Secondary | ICD-10-CM | POA: Insufficient documentation

## 2013-03-05 LAB — BASIC METABOLIC PANEL
CO2: 23 mEq/L (ref 19–32)
Chloride: 111 mEq/L (ref 96–112)
GFR calc Af Amer: 42 mL/min — ABNORMAL LOW (ref >90)
Potassium: 4.1 mEq/L (ref 3.5–5.1)

## 2013-03-05 MED ORDER — ENOXAPARIN SODIUM 100 MG/ML ~~LOC~~ SOLN
90.0000 mg | Freq: Once | SUBCUTANEOUS | Status: AC
  Administered 2013-03-05: 90 mg via SUBCUTANEOUS
  Filled 2013-03-05: qty 1

## 2013-03-05 NOTE — Progress Notes (Signed)
VASCULAR LAB PRELIMINARY  PRELIMINARY  PRELIMINARY  PRELIMINARY  Left lower extremity venous Dopplers completed.    Preliminary report:  There is no DVT or SVT noted in the left lower extremity.  There is a large hematoma noted, medial knee, with no active bleeding.  Fraidy Mccarrick, RVT 03/05/2013, 2:42 PM

## 2013-03-05 NOTE — ED Provider Notes (Signed)
Medical screening examination/treatment/procedure(s) were performed by non-physician practitioner and as supervising physician I was immediately available for consultation/collaboration.  Richarda Blade, MD 03/05/13 [phone]

## 2013-03-06 ENCOUNTER — Encounter (HOSPITAL_COMMUNITY): Payer: Self-pay

## 2013-03-06 ENCOUNTER — Inpatient Hospital Stay (HOSPITAL_COMMUNITY)
Admission: EM | Admit: 2013-03-06 | Discharge: 2013-03-08 | DRG: 603 | Disposition: A | Discharge status: Home / Self Care | Payer: MEDICAID | Attending: Internal Medicine | Admitting: Internal Medicine

## 2013-03-06 DIAGNOSIS — I2510 Atherosclerotic heart disease of native coronary artery without angina pectoris: Secondary | ICD-10-CM | POA: Diagnosis present

## 2013-03-06 DIAGNOSIS — Z8249 Family history of ischemic heart disease and other diseases of the circulatory system: Secondary | ICD-10-CM

## 2013-03-06 DIAGNOSIS — E875 Hyperkalemia: Secondary | ICD-10-CM

## 2013-03-06 DIAGNOSIS — I2119 ST elevation (STEMI) myocardial infarction involving other coronary artery of inferior wall: Secondary | ICD-10-CM

## 2013-03-06 DIAGNOSIS — Z951 Presence of aortocoronary bypass graft: Secondary | ICD-10-CM

## 2013-03-06 DIAGNOSIS — N289 Disorder of kidney and ureter, unspecified: Secondary | ICD-10-CM

## 2013-03-06 DIAGNOSIS — Z9089 Acquired absence of other organs: Secondary | ICD-10-CM

## 2013-03-06 DIAGNOSIS — Z7982 Long term (current) use of aspirin: Secondary | ICD-10-CM

## 2013-03-06 DIAGNOSIS — I252 Old myocardial infarction: Secondary | ICD-10-CM

## 2013-03-06 DIAGNOSIS — E785 Hyperlipidemia, unspecified: Secondary | ICD-10-CM | POA: Diagnosis present

## 2013-03-06 DIAGNOSIS — E7800 Pure hypercholesterolemia, unspecified: Secondary | ICD-10-CM | POA: Diagnosis present

## 2013-03-06 DIAGNOSIS — M109 Gout, unspecified: Secondary | ICD-10-CM | POA: Diagnosis present

## 2013-03-06 DIAGNOSIS — N189 Chronic kidney disease, unspecified: Secondary | ICD-10-CM | POA: Diagnosis present

## 2013-03-06 DIAGNOSIS — K219 Gastro-esophageal reflux disease without esophagitis: Secondary | ICD-10-CM

## 2013-03-06 DIAGNOSIS — L03119 Cellulitis of unspecified part of limb: Principal | ICD-10-CM | POA: Diagnosis present

## 2013-03-06 DIAGNOSIS — Z9861 Coronary angioplasty status: Secondary | ICD-10-CM

## 2013-03-06 DIAGNOSIS — Z885 Allergy status to narcotic agent status: Secondary | ICD-10-CM

## 2013-03-06 DIAGNOSIS — T148XXA Other injury of unspecified body region, initial encounter: Secondary | ICD-10-CM | POA: Diagnosis present

## 2013-03-06 DIAGNOSIS — L02419 Cutaneous abscess of limb, unspecified: Principal | ICD-10-CM | POA: Diagnosis present

## 2013-03-06 LAB — COMPREHENSIVE METABOLIC PANEL
ALT: 10 U/L (ref 0–53)
Albumin: 3.1 g/dL — ABNORMAL LOW (ref 3.5–5.2)
Alkaline Phosphatase: 103 U/L (ref 39–117)
CO2: 21 mEq/L (ref 19–32)
Calcium: 8.6 mg/dL (ref 8.4–10.5)
Chloride: 107 mEq/L (ref 96–112)
Creatinine, Ser: 1.82 mg/dL — ABNORMAL HIGH (ref 0.50–1.35)
Glucose, Bld: 130 mg/dL — ABNORMAL HIGH (ref 70–99)
Total Protein: 6.7 g/dL (ref 6.0–8.3)

## 2013-03-06 LAB — CBC WITH DIFFERENTIAL/PLATELET
Basophils Absolute: 0 10*3/uL (ref 0.0–0.1)
Eosinophils Relative: 1 % (ref 0–5)
HCT: 37.7 % — ABNORMAL LOW (ref 39.0–52.0)
Hemoglobin: 12.8 g/dL — ABNORMAL LOW (ref 13.0–17.0)
Lymphocytes Relative: 10 % — ABNORMAL LOW (ref 12–46)
Lymphs Abs: 1 10*3/uL (ref 0.7–4.0)
MCV: 92.9 fL (ref 78.0–100.0)
Monocytes Absolute: 0.9 10*3/uL (ref 0.1–1.0)
Monocytes Relative: 8 % (ref 3–12)
Neutro Abs: 8.4 10*3/uL — ABNORMAL HIGH (ref 1.7–7.7)
RBC: 4.06 MIL/uL — ABNORMAL LOW (ref 4.22–5.81)
WBC: 10.4 10*3/uL (ref 4.0–10.5)

## 2013-03-06 NOTE — ED Notes (Signed)
Pt. Has area on left medial leg that is swollen, red, warm and tender to touch. Pt. States came here Saturday for same and had ultrasound yesterday that showed hematoma. Pt. States today he began running a fever with chills.

## 2013-03-06 NOTE — ED Notes (Signed)
Pt c/o Left lateral knee pain d/t an abscess to the area. Pt reports he first noticed the abscess Saturday night, was seen here and d/c home only to return Sunday am to have an [place] completed, pt reports he has not received the reports back from his [place] and was instructed to come here due to an elevated fever and fever and chills.

## 2013-03-07 ENCOUNTER — Encounter (HOSPITAL_COMMUNITY): Payer: Self-pay | Admitting: Internal Medicine

## 2013-03-07 DIAGNOSIS — I2510 Atherosclerotic heart disease of native coronary artery without angina pectoris: Secondary | ICD-10-CM

## 2013-03-07 DIAGNOSIS — T148XXA Other injury of unspecified body region, initial encounter: Secondary | ICD-10-CM | POA: Diagnosis present

## 2013-03-07 DIAGNOSIS — L02419 Cutaneous abscess of limb, unspecified: Secondary | ICD-10-CM | POA: Diagnosis present

## 2013-03-07 LAB — CBC
MCH: 31.1 pg (ref 26.0–34.0)
Platelets: 131 10*3/uL — ABNORMAL LOW (ref 150–400)
RBC: 3.66 MIL/uL — ABNORMAL LOW (ref 4.22–5.81)
RDW: 13 % (ref 11.5–15.5)

## 2013-03-07 LAB — BASIC METABOLIC PANEL
Calcium: 8.5 mg/dL (ref 8.4–10.5)
Creatinine, Ser: 1.53 mg/dL — ABNORMAL HIGH (ref 0.50–1.35)
GFR calc non Af Amer: 49 mL/min — ABNORMAL LOW (ref >90)
Glucose, Bld: 84 mg/dL (ref 70–99)
Sodium: 139 mEq/L (ref 135–145)

## 2013-03-07 LAB — URINALYSIS, ROUTINE W REFLEX MICROSCOPIC
Glucose, UA: NEGATIVE mg/dL
Ketones, ur: 15 mg/dL — AB
Leukocytes, UA: NEGATIVE
Specific Gravity, Urine: 1.017 (ref 1.005–1.030)
pH: 6 (ref 5.0–8.0)

## 2013-03-07 LAB — URINE MICROSCOPIC-ADD ON

## 2013-03-07 LAB — CG4 I-STAT (LACTIC ACID): Lactic Acid, Venous: 1.18 mmol/L (ref 0.5–2.2)

## 2013-03-07 MED ORDER — CLONAZEPAM 1 MG PO TABS
2.0000 mg | ORAL_TABLET | Freq: Three times a day (TID) | ORAL | Status: DC | PRN
  Administered 2013-03-07: 2 mg via ORAL
  Filled 2013-03-07: qty 2

## 2013-03-07 MED ORDER — ALLOPURINOL 300 MG PO TABS
300.0000 mg | ORAL_TABLET | Freq: Every day | ORAL | Status: DC
  Administered 2013-03-07 – 2013-03-08 (×2): 300 mg via ORAL
  Filled 2013-03-07 (×2): qty 1

## 2013-03-07 MED ORDER — CLINDAMYCIN PHOSPHATE 600 MG/50ML IV SOLN
600.0000 mg | Freq: Once | INTRAVENOUS | Status: AC
  Administered 2013-03-07: 600 mg via INTRAVENOUS
  Filled 2013-03-07: qty 50

## 2013-03-07 MED ORDER — AMIODARONE HCL 200 MG PO TABS
200.0000 mg | ORAL_TABLET | Freq: Two times a day (BID) | ORAL | Status: DC
  Administered 2013-03-07 – 2013-03-08 (×3): 200 mg via ORAL
  Filled 2013-03-07 (×4): qty 1

## 2013-03-07 MED ORDER — HEPARIN SODIUM (PORCINE) 5000 UNIT/ML IJ SOLN
5000.0000 [IU] | Freq: Three times a day (TID) | INTRAMUSCULAR | Status: DC
  Administered 2013-03-07 – 2013-03-08 (×4): 5000 [IU] via SUBCUTANEOUS
  Filled 2013-03-07 (×7): qty 1

## 2013-03-07 MED ORDER — CLOPIDOGREL BISULFATE 75 MG PO TABS
75.0000 mg | ORAL_TABLET | Freq: Every day | ORAL | Status: DC
  Administered 2013-03-07 – 2013-03-08 (×2): 75 mg via ORAL
  Filled 2013-03-07 (×2): qty 1

## 2013-03-07 MED ORDER — ASPIRIN EC 81 MG PO TBEC
81.0000 mg | DELAYED_RELEASE_TABLET | Freq: Every day | ORAL | Status: DC
  Administered 2013-03-07 – 2013-03-08 (×2): 81 mg via ORAL
  Filled 2013-03-07 (×2): qty 1

## 2013-03-07 MED ORDER — ONDANSETRON HCL 4 MG/2ML IJ SOLN: 4.0000 mg | Freq: Four times a day (QID) | INTRAMUSCULAR | Status: DC | PRN

## 2013-03-07 MED ORDER — HYDROMORPHONE HCL PF 1 MG/ML IJ SOLN
1.0000 mg | INTRAMUSCULAR | Status: DC | PRN
  Administered 2013-03-07 (×3): 1 mg via INTRAVENOUS
  Filled 2013-03-07 (×3): qty 1

## 2013-03-07 MED ORDER — LORAZEPAM 2 MG/ML IJ SOLN
0.5000 mg | Freq: Once | INTRAMUSCULAR | Status: AC
  Administered 2013-03-07: 0.5 mg via INTRAVENOUS
  Filled 2013-03-07: qty 1

## 2013-03-07 MED ORDER — ATORVASTATIN CALCIUM 40 MG PO TABS
40.0000 mg | ORAL_TABLET | Freq: Every day | ORAL | Status: DC
  Administered 2013-03-07: 40 mg via ORAL
  Filled 2013-03-07 (×2): qty 1

## 2013-03-07 MED ORDER — CARVEDILOL 6.25 MG PO TABS
6.2500 mg | ORAL_TABLET | Freq: Two times a day (BID) | ORAL | Status: DC
  Administered 2013-03-07 – 2013-03-08 (×3): 6.25 mg via ORAL
  Filled 2013-03-07 (×5): qty 1

## 2013-03-07 MED ORDER — HYDROCODONE-ACETAMINOPHEN 7.5-325 MG PO TABS
1.0000 | ORAL_TABLET | Freq: Four times a day (QID) | ORAL | Status: DC | PRN
  Administered 2013-03-07: 1 via ORAL
  Administered 2013-03-08: 2 via ORAL
  Filled 2013-03-07: qty 1
  Filled 2013-03-07: qty 2

## 2013-03-07 MED ORDER — HYDROMORPHONE HCL PF 1 MG/ML IJ SOLN: 1.0000 mg | INTRAMUSCULAR | Status: DC

## 2013-03-07 MED ORDER — SODIUM CHLORIDE 0.9 % IJ SOLN: 3.0000 mL | INTRAMUSCULAR | Status: DC | PRN

## 2013-03-07 MED ORDER — SODIUM CHLORIDE 0.9 % IV SOLN: 250.0000 mL | INTRAVENOUS | Status: DC | PRN

## 2013-03-07 MED ORDER — ONDANSETRON HCL 4 MG PO TABS: 4.0000 mg | ORAL_TABLET | Freq: Four times a day (QID) | ORAL | Status: DC | PRN

## 2013-03-07 MED ORDER — SODIUM CHLORIDE 0.9 % IJ SOLN: 3.0000 mL | Freq: Two times a day (BID) | INTRAMUSCULAR | Status: DC

## 2013-03-07 MED ORDER — VANCOMYCIN HCL 10 G IV SOLR
1500.0000 mg | INTRAVENOUS | Status: DC
  Administered 2013-03-07: 1500 mg via INTRAVENOUS
  Filled 2013-03-07 (×2): qty 1500

## 2013-03-07 MED ORDER — VANCOMYCIN HCL IN DEXTROSE 1-5 GM/200ML-% IV SOLN
1000.0000 mg | Freq: Once | INTRAVENOUS | Status: AC
  Administered 2013-03-07: 1000 mg via INTRAVENOUS
  Filled 2013-03-07: qty 200

## 2013-03-07 MED ORDER — SODIUM CHLORIDE 0.9 % IJ SOLN
3.0000 mL | Freq: Two times a day (BID) | INTRAMUSCULAR | Status: DC
  Administered 2013-03-07: 3 mL via INTRAVENOUS

## 2013-03-07 MED ORDER — DOCUSATE SODIUM 100 MG PO CAPS
100.0000 mg | ORAL_CAPSULE | Freq: Two times a day (BID) | ORAL | Status: DC
  Administered 2013-03-07 – 2013-03-08 (×3): 100 mg via ORAL
  Filled 2013-03-07 (×4): qty 1

## 2013-03-07 MED ORDER — CLINDAMYCIN PHOSPHATE 600 MG/50ML IV SOLN
600.0000 mg | Freq: Four times a day (QID) | INTRAVENOUS | Status: DC
  Administered 2013-03-07 – 2013-03-08 (×5): 600 mg via INTRAVENOUS
  Filled 2013-03-07 (×7): qty 50
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
## 2013-03-07 NOTE — Consult Note (Signed)
WOC consult Note Reason for Consult: evaluation of leg leg wound.  Pt with associated induration and erythema but based on markings this has lessened.  Venous duplex indicates large hematoma of the left leg at the site of the induration. Wound type: open wound s/p I&D Measurement: 1.5cm x 0.5cm x 1.0cm no tunneling Wound [id number] to visualize Drainage (amount, consistency, odor) moderate to heavy serosanguinous  Periwound: see above appears to be improving Dressing procedure/placement/frequency: iodoform packing strip to wick drainage from the wound bed and the antibacterial effects.  Add silicone foam topper to manage the wicked exudate better.    Re consult if needed, will not follow at this time. Thanks  Rileigh Kawashima [place], [place] [phone])

## 2013-03-07 NOTE — Progress Notes (Signed)
Packed pt's left leg incision with iodoform packing strip and placed allevyn over site. Pt tolerated well.
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
## 2013-03-07 NOTE — Progress Notes (Signed)
Utilization Review Completed.Donne Anon T8/12/2012
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
## 2013-03-07 NOTE — Care Management Note (Unsigned)
    Page 1 of 1   03/07/2013     3:34:45 PM   CARE MANAGEMENT NOTE 03/07/2013  Patient:  Richard Cook, Richard Cook   Account Number:  [id number]  Date Initiated:  03/07/2013  Documentation initiated by:  Marsden Zaino  Subjective/Objective Assessment:   PT ADM ON 03/06/13 WITH LEFT LEG ABSCESS.  PTA, PT INDEPENDENT OF ADLS.     Action/Plan:   WILL FOLLOW FOR HOME NEEDS AS PT PROGRESSES.   Anticipated DC Date:  03/09/2013   Anticipated DC Plan:  [place]  CM consult      Choice offered to / List presented to:             Status of service:  In process, will continue to follow Medicare Important Message given?   (If response is "NO", the following Medicare IM given date fields will be blank) Date Medicare IM given:   Date Additional Medicare IM given:    Discharge Disposition:    Per UR Regulation:  Reviewed for med. necessity/level of care/duration of stay  If discussed at [place] of Stay Meetings, dates discussed:    Comments:
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
## 2013-03-08 DIAGNOSIS — N289 Disorder of kidney and ureter, unspecified: Secondary | ICD-10-CM

## 2013-03-08 LAB — CBC
HCT: 33.9 % — ABNORMAL LOW (ref 39.0–52.0)
MCHC: 33 g/dL (ref 30.0–36.0)
Platelets: 143 10*3/uL — ABNORMAL LOW (ref 150–400)
RDW: 12.6 % (ref 11.5–15.5)
WBC: 6.4 10*3/uL (ref 4.0–10.5)

## 2013-03-08 LAB — BASIC METABOLIC PANEL
BUN: 14 mg/dL (ref 6–23)
Chloride: 108 mEq/L (ref 96–112)
GFR calc Af Amer: 57 mL/min — ABNORMAL LOW (ref >90)
GFR calc non Af Amer: 49 mL/min — ABNORMAL LOW (ref >90)
Potassium: 4.2 mEq/L (ref 3.5–5.1)
Sodium: 137 mEq/L (ref 135–145)

## 2013-03-08 MED ORDER — DOXYCYCLINE HYCLATE 100 MG PO TABS: 100.0000 mg | ORAL_TABLET | Freq: Two times a day (BID) | ORAL | Status: DC

## 2013-03-08 NOTE — Progress Notes (Signed)
Mr. Andis has been hospitalized and is excused from work from 03/06/13 - 8/19 due to illness   Sincerely,     Doree Barthel, M.D.
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
## 2013-03-08 NOTE — Progress Notes (Signed)
Went over discharge instructions with patient. We also did education related to the patient's wound/dressing changes.Patient demonstrated properly how to pack his dressing. Patient was also given supplies to do home dressings. Patient verbalizes understanding of all discharge instructions and is stable.

## 2013-03-10 LAB — CULTURE, ROUTINE-ABSCESS

## 2013-03-13 LAB — CULTURE, BLOOD (ROUTINE X 2)
Culture: NO GROWTH
Culture: NO GROWTH

## 2013-05-12 ENCOUNTER — Encounter (HOSPITAL_COMMUNITY): Payer: Self-pay | Admitting: Emergency Medicine

## 2013-05-12 ENCOUNTER — Emergency Department (HOSPITAL_COMMUNITY)
Admission: EM | Admit: 2013-05-12 | Discharge: 2013-05-12 | Disposition: A | Discharge status: Home / Self Care | Payer: Self-pay | Attending: Emergency Medicine | Admitting: Emergency Medicine

## 2013-05-12 DIAGNOSIS — I10 Essential (primary) hypertension: Secondary | ICD-10-CM | POA: Insufficient documentation

## 2013-05-12 DIAGNOSIS — Z87448 Personal history of other diseases of urinary system: Secondary | ICD-10-CM | POA: Insufficient documentation

## 2013-05-12 DIAGNOSIS — Z7982 Long term (current) use of aspirin: Secondary | ICD-10-CM | POA: Insufficient documentation

## 2013-05-12 DIAGNOSIS — Z951 Presence of aortocoronary bypass graft: Secondary | ICD-10-CM | POA: Insufficient documentation

## 2013-05-12 DIAGNOSIS — Z7902 Long term (current) use of antithrombotics/antiplatelets: Secondary | ICD-10-CM | POA: Insufficient documentation

## 2013-05-12 DIAGNOSIS — Z79899 Other long term (current) drug therapy: Secondary | ICD-10-CM | POA: Insufficient documentation

## 2013-05-12 DIAGNOSIS — R232 Flushing: Secondary | ICD-10-CM | POA: Insufficient documentation

## 2013-05-12 DIAGNOSIS — E785 Hyperlipidemia, unspecified: Secondary | ICD-10-CM | POA: Insufficient documentation

## 2013-05-12 DIAGNOSIS — I2510 Atherosclerotic heart disease of native coronary artery without angina pectoris: Secondary | ICD-10-CM | POA: Insufficient documentation

## 2013-05-12 LAB — GLUCOSE, CAPILLARY: Glucose-Capillary: 85 mg/dL (ref 70–99)

## 2013-05-12 NOTE — ED Notes (Signed)
PT ambulatory to desk. Advised pt about wait time per policy

## 2013-05-12 NOTE — ED Notes (Signed)
Pt reports "feeling flushed" for past several days, home monitor has been reading systolic in the [id number] over past days, reports today he felt "more flushed" at work so he came to ED. No sob or cp.
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
## 2013-06-29 ENCOUNTER — Inpatient Hospital Stay (HOSPITAL_COMMUNITY): Payer: Self-pay

## 2013-06-29 ENCOUNTER — Inpatient Hospital Stay (HOSPITAL_COMMUNITY)
Admission: EM | Admit: 2013-06-29 | Discharge: 2013-06-30 | DRG: 315 | Disposition: A | Discharge status: Home / Self Care | Payer: Self-pay | Attending: Internal Medicine | Admitting: Internal Medicine

## 2013-06-29 ENCOUNTER — Encounter (HOSPITAL_COMMUNITY): Payer: Self-pay | Admitting: Emergency Medicine

## 2013-06-29 DIAGNOSIS — N189 Chronic kidney disease, unspecified: Secondary | ICD-10-CM | POA: Diagnosis present

## 2013-06-29 DIAGNOSIS — Z951 Presence of aortocoronary bypass graft: Secondary | ICD-10-CM

## 2013-06-29 DIAGNOSIS — E785 Hyperlipidemia, unspecified: Secondary | ICD-10-CM | POA: Diagnosis present

## 2013-06-29 DIAGNOSIS — I252 Old myocardial infarction: Secondary | ICD-10-CM

## 2013-06-29 DIAGNOSIS — M7989 Other specified soft tissue disorders: Secondary | ICD-10-CM

## 2013-06-29 DIAGNOSIS — Z86718 Personal history of other venous thrombosis and embolism: Secondary | ICD-10-CM

## 2013-06-29 DIAGNOSIS — I959 Hypotension, unspecified: Principal | ICD-10-CM | POA: Diagnosis present

## 2013-06-29 DIAGNOSIS — N289 Disorder of kidney and ureter, unspecified: Secondary | ICD-10-CM

## 2013-06-29 DIAGNOSIS — L02419 Cutaneous abscess of limb, unspecified: Secondary | ICD-10-CM | POA: Diagnosis present

## 2013-06-29 DIAGNOSIS — I129 Hypertensive chronic kidney disease with stage 1 through stage 4 chronic kidney disease, or unspecified chronic kidney disease: Secondary | ICD-10-CM | POA: Diagnosis present

## 2013-06-29 DIAGNOSIS — F411 Generalized anxiety disorder: Secondary | ICD-10-CM | POA: Diagnosis present

## 2013-06-29 DIAGNOSIS — I2510 Atherosclerotic heart disease of native coronary artery without angina pectoris: Secondary | ICD-10-CM | POA: Diagnosis present

## 2013-06-29 DIAGNOSIS — Z9861 Coronary angioplasty status: Secondary | ICD-10-CM

## 2013-06-29 DIAGNOSIS — N179 Acute kidney failure, unspecified: Secondary | ICD-10-CM | POA: Diagnosis present

## 2013-06-29 DIAGNOSIS — M069 Rheumatoid arthritis, unspecified: Secondary | ICD-10-CM | POA: Diagnosis present

## 2013-06-29 DIAGNOSIS — L0390 Cellulitis, unspecified: Secondary | ICD-10-CM

## 2013-06-29 DIAGNOSIS — M79609 Pain in unspecified limb: Secondary | ICD-10-CM

## 2013-06-29 HISTORY — DX: Sleep apnea, unspecified: G47.30

## 2013-06-29 HISTORY — DX: Personal history of other medical treatment: Z92.89

## 2013-06-29 HISTORY — DX: Acute myocardial infarction, unspecified: I21.9

## 2013-06-29 HISTORY — DX: Rheumatoid arthritis, unspecified: M06.9

## 2013-06-29 HISTORY — DX: Essential (primary) hypertension: I10

## 2013-06-29 HISTORY — DX: Personal history of other diseases of the musculoskeletal system and connective tissue: Z87.39

## 2013-06-29 HISTORY — DX: Anxiety disorder, unspecified: F41.9

## 2013-06-29 HISTORY — DX: Cellulitis, unspecified: L03.90

## 2013-06-29 HISTORY — DX: Acute embolism and thrombosis of unspecified deep veins of unspecified lower extremity: I82.409

## 2013-06-29 HISTORY — DX: Cutaneous abscess, unspecified: L02.91

## 2013-06-29 LAB — BASIC METABOLIC PANEL
BUN: 31 mg/dL — ABNORMAL HIGH (ref 6–23)
Calcium: 9.3 mg/dL (ref 8.4–10.5)
Creatinine, Ser: 2.82 mg/dL — ABNORMAL HIGH (ref 0.50–1.35)
GFR calc Af Amer: 27 mL/min — ABNORMAL LOW (ref >90)
GFR calc non Af Amer: 23 mL/min — ABNORMAL LOW (ref >90)
Potassium: 4.2 mEq/L (ref 3.5–5.1)
Sodium: 139 mEq/L (ref 135–145)

## 2013-06-29 LAB — TROPONIN I
Troponin I: <0.3 ng/mL (ref <0.30)
Troponin I: <0.3 ng/mL (ref <0.30)

## 2013-06-29 LAB — MAGNESIUM: Magnesium: 1.6 mg/dL (ref 1.5–2.5)

## 2013-06-29 LAB — ETHANOL: Alcohol, Ethyl (B): <11 mg/dL (ref 0–11)

## 2013-06-29 LAB — CBC
Hemoglobin: 14.9 g/dL (ref 13.0–17.0)
Platelets: 137 10*3/uL — ABNORMAL LOW (ref 150–400)
RBC: 4.5 MIL/uL (ref 4.22–5.81)
WBC: 7.3 10*3/uL (ref 4.0–10.5)

## 2013-06-29 MED ORDER — ONDANSETRON HCL 4 MG PO TABS: 4.0000 mg | ORAL_TABLET | Freq: Four times a day (QID) | ORAL | Status: DC | PRN

## 2013-06-29 MED ORDER — ATORVASTATIN CALCIUM 40 MG PO TABS
40.0000 mg | ORAL_TABLET | Freq: Every day | ORAL | Status: DC
  Administered 2013-06-29: 40 mg via ORAL
  Filled 2013-06-29 (×2): qty 1

## 2013-06-29 MED ORDER — SODIUM CHLORIDE 0.9 % IV BOLUS (SEPSIS)
1000.0000 mL | Freq: Once | INTRAVENOUS | Status: AC
  Administered 2013-06-29: 1000 mL via INTRAVENOUS

## 2013-06-29 MED ORDER — HYDROCODONE-ACETAMINOPHEN 7.5-325 MG PO TABS: 1.0000 | ORAL_TABLET | Freq: Four times a day (QID) | ORAL | Status: DC | PRN

## 2013-06-29 MED ORDER — ONDANSETRON HCL 4 MG/2ML IJ SOLN: 4.0000 mg | Freq: Four times a day (QID) | INTRAMUSCULAR | Status: DC | PRN

## 2013-06-29 MED ORDER — CLINDAMYCIN PHOSPHATE 600 MG/50ML IV SOLN
600.0000 mg | Freq: Once | INTRAVENOUS | Status: AC
  Administered 2013-06-29: 600 mg via INTRAVENOUS
  Filled 2013-06-29: qty 50

## 2013-06-29 MED ORDER — TETANUS-DIPHTH-ACELL PERTUSSIS 5-2.5-18.5 LF-MCG/0.5 IM SUSP
0.5000 mL | Freq: Once | INTRAMUSCULAR | Status: AC
  Administered 2013-06-29: 0.5 mL via INTRAMUSCULAR
  Filled 2013-06-29: qty 0.5

## 2013-06-29 MED ORDER — SULFAMETHOXAZOLE-TMP DS 800-160 MG PO TABS
1.0000 | ORAL_TABLET | Freq: Two times a day (BID) | ORAL | Status: DC
  Administered 2013-06-29: 1 via ORAL
  Filled 2013-06-29 (×3): qty 1

## 2013-06-29 MED ORDER — CLOPIDOGREL BISULFATE 75 MG PO TABS
75.0000 mg | ORAL_TABLET | Freq: Every day | ORAL | Status: DC
  Administered 2013-06-29 – 2013-06-30 (×2): 75 mg via ORAL
  Filled 2013-06-29 (×3): qty 1

## 2013-06-29 MED ORDER — HEPARIN SODIUM (PORCINE) 5000 UNIT/ML IJ SOLN
5000.0000 [IU] | Freq: Three times a day (TID) | INTRAMUSCULAR | Status: DC
  Administered 2013-06-29 – 2013-06-30 (×2): 5000 [IU] via SUBCUTANEOUS
  Filled 2013-06-29 (×5): qty 1

## 2013-06-29 MED ORDER — SULFAMETHOXAZOLE-TRIMETHOPRIM 800-160 MG PO TABS: 1.0000 | ORAL_TABLET | Freq: Two times a day (BID) | ORAL | Status: DC

## 2013-06-29 MED ORDER — OXYCODONE-ACETAMINOPHEN 5-325 MG PO TABS
2.0000 | ORAL_TABLET | Freq: Once | ORAL | Status: AC
  Administered 2013-06-29: 2 via ORAL
  Filled 2013-06-29: qty 2

## 2013-06-29 MED ORDER — CLINDAMYCIN HCL 300 MG PO CAPS: 300.0000 mg | ORAL_CAPSULE | Freq: Four times a day (QID) | ORAL | Status: DC

## 2013-06-29 MED ORDER — ASPIRIN EC 81 MG PO TBEC
81.0000 mg | DELAYED_RELEASE_TABLET | Freq: Every day | ORAL | Status: DC
  Administered 2013-06-29 – 2013-06-30 (×2): 81 mg via ORAL
  Filled 2013-06-29 (×2): qty 1

## 2013-06-29 MED ORDER — SODIUM CHLORIDE 0.9 % IJ SOLN
3.0000 mL | Freq: Two times a day (BID) | INTRAMUSCULAR | Status: DC
  Administered 2013-06-29 – 2013-06-30 (×2): 3 mL via INTRAVENOUS
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
## 2013-06-29 NOTE — ED Notes (Signed)
US at bedside

## 2013-06-29 NOTE — ED Notes (Signed)
PA at bedside.

## 2013-06-29 NOTE — Progress Notes (Signed)
Left lower extremity venous duplex completed.  Left:  No evidence of DVT, superficial thrombosis, or Baker's cyst.  Right:  Negative for DVT in the common femoral vein.  

## 2013-06-29 NOTE — ED Notes (Signed)
Dr. James at bedside  

## 2013-06-29 NOTE — ED Notes (Signed)
Pt falling asleep while this RN is in the room. Pt easily awakened with verbal stimuli.

## 2013-06-29 NOTE — ED Notes (Addendum)
Pt hypotensive, Robyn PA made aware. Pt placed in modified trendelenberg. Pt agreeable to stay for NS Bolus but states still does not want to be admitted

## 2013-06-29 NOTE — ED Notes (Signed)
Patient transported to X-ray 

## 2013-06-29 NOTE — ED Notes (Addendum)
Robyn PA at bedside to discuss plan of care with pt. Pt states does not want to be admitted. States "I have to go to work, I can't stay here overnight"

## 2013-06-29 NOTE — ED Notes (Signed)
NOTIFIED DR. Jeneen Rinks IN PERSON OF PATIENTS LAB RESULTS OF CG4 LACTIC ACID = 0.37mmoI/L, @15 :05PM , 06/29/2013.
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
## 2013-06-30 ENCOUNTER — Inpatient Hospital Stay (HOSPITAL_COMMUNITY): Payer: Self-pay

## 2013-06-30 DIAGNOSIS — L03119 Cellulitis of unspecified part of limb: Secondary | ICD-10-CM

## 2013-06-30 DIAGNOSIS — L0291 Cutaneous abscess, unspecified: Secondary | ICD-10-CM

## 2013-06-30 DIAGNOSIS — L02419 Cutaneous abscess of limb, unspecified: Secondary | ICD-10-CM

## 2013-06-30 LAB — LACTIC ACID, PLASMA: Lactic Acid, Venous: 0.9 mmol/L (ref 0.5–2.2)

## 2013-06-30 LAB — CBC
HCT: 34.8 % — ABNORMAL LOW (ref 39.0–52.0)
Hemoglobin: 11.6 g/dL — ABNORMAL LOW (ref 13.0–17.0)
MCH: 32 pg (ref 26.0–34.0)
MCHC: 33.3 g/dL (ref 30.0–36.0)
MCV: 95.9 fL (ref 78.0–100.0)
RBC: 3.63 MIL/uL — ABNORMAL LOW (ref 4.22–5.81)
RDW: 13.2 % (ref 11.5–15.5)

## 2013-06-30 LAB — CBC WITH DIFFERENTIAL/PLATELET
Basophils Absolute: 0 10*3/uL (ref 0.0–0.1)
HCT: 34.4 % — ABNORMAL LOW (ref 39.0–52.0)
Hemoglobin: 11.3 g/dL — ABNORMAL LOW (ref 13.0–17.0)
Lymphocytes Relative: 18 % (ref 12–46)
Monocytes Absolute: 0.5 10*3/uL (ref 0.1–1.0)
Monocytes Relative: 10 % (ref 3–12)
Neutro Abs: 3.2 10*3/uL (ref 1.7–7.7)
WBC: 4.6 10*3/uL (ref 4.0–10.5)

## 2013-06-30 LAB — BASIC METABOLIC PANEL
BUN: 30 mg/dL — ABNORMAL HIGH (ref 6–23)
CO2: 23 mEq/L (ref 19–32)
Chloride: 105 mEq/L (ref 96–112)
Creatinine, Ser: 2.55 mg/dL — ABNORMAL HIGH (ref 0.50–1.35)
Potassium: 4.3 mEq/L (ref 3.5–5.1)

## 2013-06-30 MED ORDER — DOXYCYCLINE HYCLATE 100 MG PO CAPS: 100.0000 mg | ORAL_CAPSULE | Freq: Two times a day (BID) | ORAL | Status: DC

## 2013-06-30 MED ORDER — CARVEDILOL 3.125 MG PO TABS: 3.1250 mg | ORAL_TABLET | Freq: Two times a day (BID) | ORAL | Status: DC

## 2013-06-30 MED ORDER — CLINDAMYCIN PHOSPHATE 600 MG/50ML IV SOLN
600.0000 mg | Freq: Once | INTRAVENOUS | Status: AC
  Administered 2013-06-30: 600 mg via INTRAVENOUS
  Filled 2013-06-30: qty 50

## 2013-06-30 MED ORDER — SODIUM CHLORIDE 0.9 % IV SOLN
INTRAVENOUS | Status: DC
  Administered 2013-06-30: 08:00:00 via INTRAVENOUS

## 2013-06-30 MED ORDER — ACETAMINOPHEN 325 MG PO TABS
650.0000 mg | ORAL_TABLET | Freq: Four times a day (QID) | ORAL | Status: DC | PRN
  Administered 2013-06-30: 650 mg via ORAL
  Filled 2013-06-30: qty 2

## 2013-06-30 MED ORDER — VANCOMYCIN HCL IN DEXTROSE 1-5 GM/200ML-% IV SOLN
1000.0000 mg | INTRAVENOUS | Status: DC
  Administered 2013-06-30: 1000 mg via INTRAVENOUS
  Filled 2013-06-30 (×2): qty 200

## 2013-06-30 NOTE — Progress Notes (Signed)
Spoke with Dr. Redmond Pulling. I am unable to see the patient by 5 pm since seeing consults at 2 other hospitals. Per review of H & P and surgery assessment, it appears that patient would not require any I x D , but treatment for celluliltis is appropriate. Plan is to treat for 10 days with doxycycline 100mg  BID. Since I will not be able to see the patient at 5pm and he may leave AMA, we can offer him to follow up in the ID clinic as an outpatient to follow up on his care at the hospital.  Caren Griffins [facility] [phone]
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
## 2013-06-30 NOTE — Progress Notes (Signed)
UR Completed.  Richard Cook [id number] 06/30/2013

## 2013-06-30 NOTE — Discharge Summary (Signed)
Patient Name:  Richard Cook  MRN: [id number]  PCP: Carlyn Reichert, MD  DOB:  1955/10/11       Date of Admission:  06/29/2013  Date of Discharge:  06/30/2013      Attending Physician: Dr. Dominic Pea, DO         DISCHARGE DIAGNOSES: 1.   Hypotension 2.   CAD 3.   Cellulitis and abscess of leg 4.   AKI (acute kidney injury)   DISPOSITION AND FOLLOW-UP: Richard Cook is to follow-up with the listed providers as detailed below, at patient's visiting, please address following issues:  1) right lower leg cellulitis - is this improving with 10 day course of Doxycycline?    2) BP assessment - his Coreg was reduced to 3.125mg  BID at discharge  2) AKI on CKD - will need BMP  3) long QT on EKG - will need cardiology referral  Follow-up Information   Follow up with [facility]. (If symptoms worsen)    Specialty:  Emergency Medicine   Contact information:   [id number] [place] [address] [phone]      Schedule an appointment as soon as possible for a visit with Carlyn Reichert, MD.   Specialty:  Family Medicine   Contact information:   [address] [place] [address] [phone]         DISCHARGE MEDICATIONS:   Medication List    STOP taking these medications       sulfamethoxazole-trimethoprim 800-160 MG per tablet  Commonly known as:  BACTRIM DS,SEPTRA DS      TAKE these medications       allopurinol 300 MG tablet  Commonly known as:  ZYLOPRIM  Take 300 mg by mouth daily.     amiodarone 200 MG tablet  Commonly known as:  PACERONE  Take 200 mg by mouth every 12 (twelve) hours.     aspirin EC 81 MG tablet  Take 81 mg by mouth daily.     carvedilol 3.125 MG tablet  Commonly known as:  COREG  Take 1 tablet (3.125 mg total) by mouth every 12 (twelve) hours.     clonazePAM 2 MG tablet  Commonly known as:  KLONOPIN  Take 2 mg by mouth 3  (three) times daily as needed for anxiety.     clopidogrel 75 MG tablet  Commonly known as:  PLAVIX  Take 75 mg by mouth daily.     doxycycline 100 MG capsule  Commonly known as:  VIBRAMYCIN  Take 1 capsule (100 mg total) by mouth 2 (two) times daily.     HYDROcodone-acetaminophen 7.5-325 MG per tablet  Commonly known as:  NORCO  Take 1-2 tablets by mouth every 6 (six) hours as needed for pain.     rosuvastatin 20 MG tablet  Commonly known as:  CRESTOR  Take 20 mg by mouth daily.         CONSULTS:   General Surgery Infectious Disease   PROCEDURES PERFORMED:  Dg Chest 2 View  06/29/2013   CLINICAL DATA:  Hypoxia, weakness, leg infection  EXAM: CHEST  2 VIEW  COMPARISON:  09/02/2011  FINDINGS: Borderline cardiomegaly. Status post median sternotomy. No acute infiltrate or pleural effusion. No pulmonary edema. Mild degenerative changes thoracic spine.  IMPRESSION: No active disease.  Borderline cardiomegaly.   Electronically Signed   By: Lahoma Crocker M.D.   On: 06/29/2013 16:15   [place] Extrem Low Left Ltd  06/30/2013   CLINICAL DATA:  Clinical cellulitis of the left leg. , evaluate for abscess.  EXAM: Left LOWER EXTREMITY LIMITED SOFT TISSUE ULTRASOUND.  TECHNIQUE: Ultrasound examination of the lower extremity soft tissues was performed in the area of clinical concern.  COMPARISON:  None  FINDINGS: *In the area of clinical concern in the medial aspect of the knee there is an irregularly large marginated hypoechoic region consistent with a fluid collection measuring approximately 3 x 2 x 2.4 cm. There are adjacent vascular structures but the fluid collection itself does not exhibit a vascular ram or septation. No typical fluid collections suggesting of bowel  IMPRESSION: There is a complex irregularly marginated, non encapsulated appearing fluid collection in the medial aspect of the right knee. This could reflect edema or blood products or conceivably an abscess.   Electronically Signed    By: David  [place]   On: 06/30/2013 10:18       ADMISSION DATA: H&P: Richard Cook is a 57 year old male with history of CAD (s/p MI with NDES stent placement 08/2011 and CABG 11/2012), HLD and renal insufficiency. He presented to the ED with complaint of 10/10 left leg pain and increased swelling x 3 days. He called his PCP when his symptoms began and was prescribed Bactrim. He denies fevers/chills, headache, N/V, CP/SOB, dysuria, sick contacts, prolonged immobility or recent travel. He was admitted to the hospital for left leg cellulitis and abscess in 03/2013 and reports that this feels the same. Before reporting to the ED today he used a scalpel (cleaned with alcohol and warm water) to lance the red area on his thigh.  The patient was started on IV clindamycin and advised he should be admitted for observation. However, the patient declined admission and was to be discharged with antibiotics and to follow-up with his PCP. While preparing to be discharged from the ED his BP was noted to have dropped from 130/85 to 91/61 during ED stay. He failed to respond to 2L boluses so IMTS was called for admission.  Physical Exam: Blood pressure 95/68, pulse 52, temperature 97.5 F (36.4 C), temperature source Oral, resp. rate 18, SpO2 98.00%.  General: resting in bed in NAD, in trandelenberg position, somnolent but easily arousable  HEENT: PERRL, EOMI, no scleral icterus  Cardiac: RRR, no rubs, murmurs or gallops  Pulm: clear to auscultation bilaterally, moving normal volumes of air, no wheezes/rales/rhonchi  Abd: soft, nontender, nondistended, hypoactive BS  Ext: warm and well perfused, trace pedal edema, medial left leg (above and just below knee) erythematous and TTP; scar from previous surgery; 2+ DPs  Neuro: alert and oriented X3, cranial nerves II-XII grossly intact; 5/5 MMS B/L  Labs: Basic Metabolic Panel:   Recent Labs   06/29/13 1056   NA  139   K  4.2   CL  101   CO2  24   GLUCOSE  81     BUN  31*   CREATININE  2.82*   CALCIUM  9.3    CBC:   Recent Labs   06/29/13 1056   WBC  7.3   HGB  14.9   HCT  43.6   MCV  96.9   PLT  137*    Cardiac Enzymes:   Recent Labs   06/29/13 1453   TROPONINI  <0.30    Alcohol Level:   Recent Labs   06/29/13 [place]  <11    HOSPITAL COURSE: Hypotension:  Richard Cook became hypotensive in the  ED and failed to respond to 2L fluid boluses. Possible etiologies included developing sepsis, MI, PE, polypharmacy. Sepsis was possible given the fact the patient has cellulitis, lanced his own skin and packed it with packing that he had at home prior to ED arrival. However, he denied infectious symptoms, was afebrile, had no leukocytosis, was not tachypneic or tachycardic. ACS was ruled out given negative troponins and lack of EKG findings. His hypotension could have been due to a combination of medication and decreased po intake.  The patient reported his beta blocker was recently increased and he is also on amiodarone.  Both of these medications were held during his admission.  His BP was still soft but had improved with hydration and increased po by day of discharge.  His Coreg dose was decreased to 3.125mg  BID at discharge.  Cellulitis:  He was initially treated with po Bactrim and then switched to IV Clindamycin and Vancomycin.  Left leg US revealed 3 x 2 x 2.4cm complex, irregularly marginated, non-encapsulated appearing fluid collection in the medial aspect of the right knee which could represent edema, blood products or abscess. The patient agreed to stay for evaluation by surgery but was insistent on leaving by the afternoon of hospital day 2.  Patient was evaluated by General Surgery who did not feel I&D was necessary and agreed with Doxycycline 100mg  BID x 10days at discharge.  Since this is the patient's second infection in the past four months ID was consulted as well.  However the patient needed to leave and ID was unable to see  him before discharge.  ID did agree with surgery recommendations and suggested the patient follow-up in the [facility] after hospital follow-up.  The [facility] will contact the patient to set up a follow-up appointment.    CAD:  No CP or acute ischemic changes on EKG. CE negative x 3.  Aspirin and Plavix were continued during admission.  AKI on CKD:  Creatinine 2.82 at admission but improved to 2.55 at discharge.  His creatinine was 1.53-1.97 during hospitalization in 03/2013. AKI likely related to decreased po and and exacerbated by Bactrim use.  Bactrim was discontinued and he was discharged on Doxycycline.  He will need BMP check at hospital follow-up.  Patient's PCP and [facility] were contacted on 07/03/13 and they will call the patient and schedule follow-up appointments.  DISCHARGE DATA: Vital Signs: BP 107/58  Pulse 70  Temp(Src) 100.1 F (37.8 C) (Oral)  Resp 18  Ht 5\' 11"  (1.803 m)  Wt 91.7 kg (202 lb 2.6 oz)  BMI 28.21 kg/m2  SpO2 98%  Labs: Results for orders placed during the hospital encounter of 06/29/13 (from the past 24 hour(s))  ETHANOL     Status: None   Collection Time    06/29/13  3:32 PM      Result Value Range   Alcohol, Ethyl (B) <11  0 - 11 mg/dL  CBC     Status: Abnormal   Collection Time    06/29/13 10:44 PM      Result Value Range   WBC 4.3  4.0 - 10.5 K/uL   RBC 3.63 (*) 4.22 - 5.81 MIL/uL   Hemoglobin 11.6 (*) 13.0 - 17.0 g/dL   HCT 34.8 (*) 39.0 - 52.0 %   MCV 95.9  78.0 - 100.0 fL   MCH 32.0  26.0 - 34.0 pg   MCHC 33.3  30.0 - 36.0 g/dL   RDW 13.2  11.5 - 15.5 %   Platelets  105 (*) 150 - 400 K/uL  CREATININE, SERUM     Status: Abnormal   Collection Time    06/29/13 10:44 PM      Result Value Range   Creatinine, Ser 2.63 (*) 0.50 - 1.35 mg/dL   GFR calc non Af Amer 26 (*) >90 mL/min   GFR calc Af Amer 30 (*) >90 mL/min  TROPONIN I     Status: None   Collection Time    06/29/13 10:44 PM      Result Value Range   Troponin I <0.30  <0.30 ng/mL   MAGNESIUM     Status: None   Collection Time    06/29/13 10:44 PM      Result Value Range   Magnesium 1.6  1.5 - 2.5 mg/dL  TROPONIN I     Status: None   Collection Time    06/30/13  5:45 AM      Result Value Range   Troponin I <0.30  <0.30 ng/mL  CBC WITH DIFFERENTIAL     Status: Abnormal   Collection Time    06/30/13  5:45 AM      Result Value Range   WBC 4.6  4.0 - 10.5 K/uL   RBC 3.58 (*) 4.22 - 5.81 MIL/uL   Hemoglobin 11.3 (*) 13.0 - 17.0 g/dL   HCT 34.4 (*) 39.0 - 52.0 %   MCV 96.1  78.0 - 100.0 fL   MCH 31.6  26.0 - 34.0 pg   MCHC 32.8  30.0 - 36.0 g/dL   RDW 13.0  11.5 - 15.5 %   Platelets 121 (*) 150 - 400 K/uL   Neutrophils Relative % 70  43 - 77 %   Neutro Abs 3.2  1.7 - 7.7 K/uL   Lymphocytes Relative 18  12 - 46 %   Lymphs Abs 0.8  0.7 - 4.0 K/uL   Monocytes Relative 10  3 - 12 %   Monocytes Absolute 0.5  0.1 - 1.0 K/uL   Eosinophils Relative 2  0 - 5 %   Eosinophils Absolute 0.1  0.0 - 0.7 K/uL   Basophils Relative 0  0 - 1 %   Basophils Absolute 0.0  0.0 - 0.1 K/uL  BASIC METABOLIC PANEL     Status: Abnormal   Collection Time    06/30/13  5:45 AM      Result Value Range   Sodium 136  135 - 145 mEq/L   Potassium 4.3  3.5 - 5.1 mEq/L   Chloride 105  96 - 112 mEq/L   CO2 23  19 - 32 mEq/L   Glucose, Bld 117 (*) 70 - 99 mg/dL   BUN 30 (*) 6 - 23 mg/dL   Creatinine, Ser 2.55 (*) 0.50 - 1.35 mg/dL   Calcium 8.3 (*) 8.4 - 10.5 mg/dL   GFR calc non Af Amer 27 (*) >90 mL/min   GFR calc Af Amer 31 (*) >90 mL/min  LACTIC ACID, PLASMA     Status: None   Collection Time    06/30/13  9:10 AM      Result Value Range   Lactic Acid, Venous 0.9  0.5 - 2.2 mmol/L     Services Ordered on Discharge: Y = Yes; Blank = No PT:   OT:   RN:   Equipment:   Other:      Time Spent on Discharge: 35 min   Signed: Duwaine Maxin PGY 1, Internal Medicine Resident 06/30/2013, 3:03 PM
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
## 2013-06-30 NOTE — Progress Notes (Signed)
Spoke with pt regarding his care.  Pt states he will leave by 1700 if Infectious Disease MD has not come to see him.  He stated he feels upset because he keeps getting promised times for the different physicians' arrival.  I explained the process to the patient again in regards to consults, but he is still unhappy.  He said to have things ready for him to leave at 1700 if no one shows up.

## 2013-06-30 NOTE — Progress Notes (Signed)
Pt discharged home.  A&O.  No c/o pain.  IV d/c'd.  Tele d/c'd.  Education given to pt on follow-up care and appts, medications, diet, and activity.  Pt verbalized understanding.  Pt taken out of building in [place].
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
## 2013-06-30 NOTE — Progress Notes (Signed)
Subjective: Richard Cook was seen and examined this morning.  He is very angry because he was expecting to be discharged early this morning.  However, there is concern for infection given his cellulitis and hypotension.  The primary team has explained the concern to him and he is willing to stay until this afternoon until a soft tissue US can be done to check for abscess or fluid collection.  Objective: Vital signs in last 24 hours: Filed Vitals:   06/29/13 1643 06/29/13 2000 06/30/13 0307 06/30/13 0531  BP: 95/68 98/62  107/58  Pulse: 52 57  70  Temp: 97.5 F (36.4 C) 98 F (36.7 C)  100.1 F (37.8 C)  TempSrc: Oral Oral  Oral  Resp: 18 18  18   Weight:   91.7 kg (202 lb 2.6 oz)   SpO2: 98% 97%  98%   Weight change:   Intake/Output Summary (Last 24 hours) at 06/30/13 1043 Last data filed at 06/30/13 0308  Gross per 24 hour  Intake      3 ml  Output   1500 ml  Net  -1497 ml   General: resting in bed in NAD   Cardiac: RRR, no rubs, murmurs or gallops  Pulm: clear to auscultation bilaterally, moving normal volumes of air, no wheezes/rales/rhonchi  Abd: soft, nontender, nondistended Ext: warm and well perfused, trace pedal edema, medial left leg (above and just below knee) erythematous and TTP Neuro: alert and oriented X3  Lab Results: Basic Metabolic Panel:  Recent Labs Lab 06/29/13 1056 06/29/13 2244 06/30/13 0545  NA 139  --  136  K 4.2  --  4.3  CL 101  --  105  CO2 24  --  23  GLUCOSE 81  --  117*  BUN 31*  --  30*  CREATININE 2.82* 2.63* 2.55*  CALCIUM 9.3  --  8.3*  MG  --  1.6  --    CBC:  Recent Labs Lab 06/29/13 2244 06/30/13 0545  WBC 4.3 4.6  NEUTROABS  --  3.2  HGB 11.6* 11.3*  HCT 34.8* 34.4*  MCV 95.9 96.1  PLT 105* 121*   Cardiac Enzymes:  Recent Labs Lab 06/29/13 1453 06/29/13 2244 06/30/13 0545  TROPONINI <0.30 <0.30 <0.30    Alcohol Level:  Recent Labs Lab 06/29/13 1532  ETH <11   Studies/Results: Dg Chest 2  View  06/29/2013   CLINICAL DATA:  Hypoxia, weakness, leg infection  EXAM: CHEST  2 VIEW  COMPARISON:  09/02/2011  FINDINGS: Borderline cardiomegaly. Status post median sternotomy. No acute infiltrate or pleural effusion. No pulmonary edema. Mild degenerative changes thoracic spine.  IMPRESSION: No active disease.  Borderline cardiomegaly.   Electronically Signed   By: Lahoma Crocker M.D.   On: 06/29/2013 16:15   [place] Extrem Low Left Ltd  06/30/2013   CLINICAL DATA:  Clinical cellulitis of the left leg. , evaluate for abscess.  EXAM: Left LOWER EXTREMITY LIMITED SOFT TISSUE ULTRASOUND.  TECHNIQUE: Ultrasound examination of the lower extremity soft tissues was performed in the area of clinical concern.  COMPARISON:  None  FINDINGS: *In the area of clinical concern in the medial aspect of the knee there is an irregularly large marginated hypoechoic region consistent with a fluid collection measuring approximately 3 x 2 x 2.4 cm. There are adjacent vascular structures but the fluid collection itself does not exhibit a vascular ram or septation. No typical fluid collections suggesting of bowel  IMPRESSION: There is a complex irregularly marginated, non  encapsulated appearing fluid collection in the medial aspect of the right knee. This could reflect edema or blood products or conceivably an abscess.   Electronically Signed   By: David  Martinique   On: 06/30/2013 10:18   Medications: I have reviewed the patient's current medications. Scheduled Meds: . aspirin EC  81 mg Oral Daily  . atorvastatin  40 mg Oral q1800  . clopidogrel  75 mg Oral Q breakfast  . heparin  5,000 Units Subcutaneous Q8H  . sodium chloride  3 mL Intravenous Q12H  . vancomycin  1,000 mg Intravenous Q24H   Continuous Infusions: . sodium chloride 125 mL/hr at 06/30/13 0824   PRN Meds:.acetaminophen, HYDROcodone-acetaminophen  Assessment/Plan: 57 year old male with history of CAD (s/p MI with NDES stent placement 08/2011 and CABG 11/2012),  HLD and renal insufficiency.   #Hypotension:  Patient became hypotensive in the ED and failed to respond to 2L fluid boluses. Possible etiologies include developing sepsis, MI, PE, polypharmacy. Sepsis is possible given the fact the patient lanced his own skin yesterday and packed it with packing that he has at home.  However, he denies infectious symptoms, is afebrile, has no leukocytosis, is not tachypneic or tachycardic.  ACS and PE have been ruled out.  His hypotension could be due to a combination of medication and decreased po. The patient reports his beta blocker was recently increased from 12.5mg  BID to 25mg  BID. He is also on amiodarone.  He reports compliance with his medications but does not use a pill box and admits he may take them at the wrong times. He took his medications today prior to coming to the ED and he has had decreased po prior to admission.   - Blood cultures, UDS, UA and culture pending - avoid QT prolonging drugs; holding amiodarone - holding coreg   #Cellulitis:  Left leg [place]:  Reveals 3 x 2 x 2.4cm complex, irregularly marginated, non-encapsulated appearing fluid collection in the medial aspect of the right knee which could represent edema, blood products or abscess.  Patient has agreed to stay for I&D.  Will need Doxycycline 100mg  BID x 10days at discharge. - consult General Surgery for I&D - po Bactrim --> IV Clindamycin and Vancomycin  #CAD:  No CP or acute ischemic changes on EKG. CE negative x 3. - continue home medications: Aspirin, Plavix   #AKI on CKD:  Creatinine 2.82-->2.5 since admission; creatinine 1.53-1.97 in 03/2013; AKI likely related to decreased po and and exacerbated by Bactrim use. - encourage po - monitor BMP - stop Bactrim   #HLD:  - continue home medication: Crestor   #Diet: Cardiac   #DVT ppx: [place] Heparin   Code status: Full  Dispo: Disposition is deferred at this time, awaiting improvement of current medical problems.  Anticipated  discharge in approximately 1 day(s).   The patient does have a current PCP Lelon Huh, MD) and does need an [facility] hospital follow-up appointment after discharge.  The patient does not have transportation limitations that hinder transportation to clinic appointments.  .Services Needed at time of discharge: Y = Yes, Blank = No PT:   OT:   RN:   Equipment:   Other:     LOS: 1 day   Duwaine Maxin, DO 06/30/2013, 10:43 AM

## 2013-06-30 NOTE — Consult Note (Signed)
I agree with above.  He had prior hematoma earlier this year at that site and states the "knot" that is there has been there.  Don't see any indication for i and d right now. Don't think he is septic either.

## 2013-07-04 NOTE — ED Provider Notes (Signed)
Medical screening examination/treatment/procedure(s) were performed by non-physician practitioner and as supervising physician I was immediately available for consultation/collaboration.  EKG Interpretation    Date/Time:  Thursday June 29 2013 15:23:37 EST Ventricular Rate:  51 PR Interval:  214 QRS Duration: 98 QT Interval:  556 QTC Calculation: 512 R Axis:   99 Text Interpretation:  Sinus rhythm Prolonged PR interval Borderline right axis deviation Low voltage, extremity leads Anteroseptal infarct, age indeterminate Prolonged QT interval ED PHYSICIAN INTERPRETATION AVAILABLE IN [facility] Confirmed by TEST, RECORD ([id number]) on 07/01/2013 1:28:31 PM              Lebron Quam, MD 07/04/13 1534

## 2013-07-06 LAB — CULTURE, BLOOD (ROUTINE X 2)
Culture: NO GROWTH
Culture: NO GROWTH

## 2013-07-06 NOTE — Discharge Summary (Signed)
  Date: 07/06/2013  Patient name: Richard Cook  Medical record number: [id number]  Date of birth: 02-20-1956   This patient has been seen and the plan of care was discussed with the house staff. Please see their note for complete details. I concur with their findings and plan.  Dominic Pea, DO, [facility] 07/06/2013, 4:30 PM

## 2013-07-12 ENCOUNTER — Inpatient Hospital Stay: Payer: Self-pay | Admitting: Internal Medicine

## 2013-10-16 ENCOUNTER — Inpatient Hospital Stay (HOSPITAL_COMMUNITY)
Admission: EM | Admit: 2013-10-16 | Discharge: 2013-10-19 | DRG: 225 | Disposition: A | Discharge status: Home / Self Care | Payer: Self-pay | Attending: Internal Medicine | Admitting: Internal Medicine

## 2013-10-16 ENCOUNTER — Encounter (HOSPITAL_COMMUNITY): Payer: Self-pay | Admitting: Emergency Medicine

## 2013-10-16 DIAGNOSIS — Z8249 Family history of ischemic heart disease and other diseases of the circulatory system: Secondary | ICD-10-CM

## 2013-10-16 DIAGNOSIS — I959 Hypotension, unspecified: Secondary | ICD-10-CM | POA: Diagnosis present

## 2013-10-16 DIAGNOSIS — I252 Old myocardial infarction: Secondary | ICD-10-CM

## 2013-10-16 DIAGNOSIS — I2510 Atherosclerotic heart disease of native coronary artery without angina pectoris: Secondary | ICD-10-CM | POA: Diagnosis present

## 2013-10-16 DIAGNOSIS — E785 Hyperlipidemia, unspecified: Secondary | ICD-10-CM | POA: Diagnosis present

## 2013-10-16 DIAGNOSIS — N183 Chronic kidney disease, stage 3 unspecified: Secondary | ICD-10-CM | POA: Diagnosis present

## 2013-10-16 DIAGNOSIS — M069 Rheumatoid arthritis, unspecified: Secondary | ICD-10-CM | POA: Diagnosis present

## 2013-10-16 DIAGNOSIS — I4729 Other ventricular tachycardia: Principal | ICD-10-CM | POA: Diagnosis present

## 2013-10-16 DIAGNOSIS — Z9119 Patient's noncompliance with other medical treatment and regimen: Secondary | ICD-10-CM

## 2013-10-16 DIAGNOSIS — I129 Hypertensive chronic kidney disease with stage 1 through stage 4 chronic kidney disease, or unspecified chronic kidney disease: Secondary | ICD-10-CM | POA: Diagnosis present

## 2013-10-16 DIAGNOSIS — K219 Gastro-esophageal reflux disease without esophagitis: Secondary | ICD-10-CM | POA: Diagnosis present

## 2013-10-16 DIAGNOSIS — I472 Ventricular tachycardia, unspecified: Principal | ICD-10-CM | POA: Diagnosis present

## 2013-10-16 DIAGNOSIS — G4733 Obstructive sleep apnea (adult) (pediatric): Secondary | ICD-10-CM | POA: Diagnosis present

## 2013-10-16 DIAGNOSIS — I471 Supraventricular tachycardia: Secondary | ICD-10-CM | POA: Insufficient documentation

## 2013-10-16 DIAGNOSIS — Z9861 Coronary angioplasty status: Secondary | ICD-10-CM

## 2013-10-16 DIAGNOSIS — Z951 Presence of aortocoronary bypass graft: Secondary | ICD-10-CM

## 2013-10-16 DIAGNOSIS — I2589 Other forms of chronic ischemic heart disease: Secondary | ICD-10-CM | POA: Diagnosis present

## 2013-10-16 DIAGNOSIS — E7800 Pure hypercholesterolemia, unspecified: Secondary | ICD-10-CM | POA: Diagnosis present

## 2013-10-16 DIAGNOSIS — N179 Acute kidney failure, unspecified: Secondary | ICD-10-CM | POA: Diagnosis not present

## 2013-10-16 DIAGNOSIS — Z91199 Patient's noncompliance with other medical treatment and regimen due to unspecified reason: Secondary | ICD-10-CM

## 2013-10-16 HISTORY — DX: Supraventricular tachycardia: I47.1

## 2013-10-16 HISTORY — DX: Supraventricular tachycardia, unspecified: I47.10

## 2013-10-16 LAB — COMPREHENSIVE METABOLIC PANEL
ALK PHOS: 87 U/L (ref 39–117)
ALT: 18 U/L (ref 0–53)
AST: 20 U/L (ref 0–37)
Albumin: 3.8 g/dL (ref 3.5–5.2)
BILIRUBIN TOTAL: 0.4 mg/dL (ref 0.3–1.2)
BUN: 27 mg/dL — AB (ref 6–23)
CHLORIDE: 107 meq/L (ref 96–112)
CO2: 22 mEq/L (ref 19–32)
Calcium: 9.1 mg/dL (ref 8.4–10.5)
Creatinine, Ser: 2.51 mg/dL — ABNORMAL HIGH (ref 0.50–1.35)
GFR calc non Af Amer: 27 mL/min — ABNORMAL LOW (ref >90)
GFR, EST AFRICAN AMERICAN: 31 mL/min — AB (ref >90)
GLUCOSE: 111 mg/dL — AB (ref 70–99)
Potassium: 4.7 mEq/L (ref 3.7–5.3)
Sodium: 145 mEq/L (ref 137–147)
Total Protein: 6.9 g/dL (ref 6.0–8.3)

## 2013-10-16 LAB — PHOSPHORUS: PHOSPHORUS: 3.4 mg/dL (ref 2.3–4.6)

## 2013-10-16 LAB — CBC WITH DIFFERENTIAL/PLATELET
Basophils Absolute: 0 10*3/uL (ref 0.0–0.1)
Basophils Relative: 0 % (ref 0–1)
EOS ABS: 0.3 10*3/uL (ref 0.0–0.7)
Eosinophils Relative: 3 % (ref 0–5)
HEMATOCRIT: 41.5 % (ref 39.0–52.0)
Hemoglobin: 14.3 g/dL (ref 13.0–17.0)
LYMPHS ABS: 2.2 10*3/uL (ref 0.7–4.0)
Lymphocytes Relative: 19 % (ref 12–46)
MCH: 32.7 pg (ref 26.0–34.0)
MCHC: 34.5 g/dL (ref 30.0–36.0)
MCV: 95 fL (ref 78.0–100.0)
Monocytes Absolute: 1 10*3/uL (ref 0.1–1.0)
Monocytes Relative: 9 % (ref 3–12)
NEUTROS ABS: 8.3 10*3/uL — AB (ref 1.7–7.7)
NEUTROS PCT: 70 % (ref 43–77)
Platelets: 222 10*3/uL (ref 150–400)
RBC: 4.37 MIL/uL (ref 4.22–5.81)
RDW: 13.6 % (ref 11.5–15.5)
WBC: 11.8 10*3/uL — ABNORMAL HIGH (ref 4.0–10.5)

## 2013-10-16 LAB — MRSA PCR SCREENING: MRSA by PCR: NEGATIVE

## 2013-10-16 LAB — TROPONIN I: Troponin I: <0.3 ng/mL (ref <0.30)

## 2013-10-16 LAB — APTT: aPTT: 31 seconds (ref 24–37)

## 2013-10-16 LAB — PROTIME-INR
INR: 1.06 (ref 0.00–1.49)
PROTHROMBIN TIME: 13.6 s (ref 11.6–15.2)

## 2013-10-16 LAB — I-STAT TROPONIN, ED: Troponin i, poc: 0.13 ng/mL (ref 0.00–0.08)

## 2013-10-16 LAB — MAGNESIUM: MAGNESIUM: 1.7 mg/dL (ref 1.5–2.5)

## 2013-10-16 LAB — PRO B NATRIURETIC PEPTIDE: Pro B Natriuretic peptide (BNP): 3421 pg/mL — ABNORMAL HIGH (ref 0–125)

## 2013-10-16 MED ORDER — HEPARIN BOLUS VIA INFUSION
4000.0000 [IU] | Freq: Once | INTRAVENOUS | Status: AC
  Administered 2013-10-16: 4000 [IU] via INTRAVENOUS
  Filled 2013-10-16: qty 4000

## 2013-10-16 MED ORDER — SODIUM CHLORIDE 0.9 % IV SOLN
Freq: Once | INTRAVENOUS | Status: AC
  Administered 2013-10-16: 17:00:00 via INTRAVENOUS

## 2013-10-16 MED ORDER — ATORVASTATIN CALCIUM 40 MG PO TABS
40.0000 mg | ORAL_TABLET | Freq: Every day | ORAL | Status: DC
  Administered 2013-10-16 – 2013-10-18 (×3): 40 mg via ORAL
  Filled 2013-10-16 (×6): qty 1

## 2013-10-16 MED ORDER — ZOLPIDEM TARTRATE 5 MG PO TABS
5.0000 mg | ORAL_TABLET | Freq: Every evening | ORAL | Status: DC | PRN
  Administered 2013-10-16 – 2013-10-18 (×3): 5 mg via ORAL
  Filled 2013-10-16 (×4): qty 1

## 2013-10-16 MED ORDER — METOPROLOL TARTRATE 12.5 MG HALF TABLET
12.5000 mg | ORAL_TABLET | Freq: Two times a day (BID) | ORAL | Status: DC
  Administered 2013-10-16 – 2013-10-19 (×5): 12.5 mg via ORAL
  Filled 2013-10-16 (×8): qty 1

## 2013-10-16 MED ORDER — ADENOSINE 6 MG/2ML IV SOLN
INTRAVENOUS | Status: AC
  Administered 2013-10-16: 6 mg
  Filled 2013-10-16: qty 2

## 2013-10-16 MED ORDER — ALPRAZOLAM 0.25 MG PO TABS
0.2500 mg | ORAL_TABLET | Freq: Two times a day (BID) | ORAL | Status: DC | PRN
  Administered 2013-10-16 – 2013-10-18 (×3): 0.25 mg via ORAL
  Filled 2013-10-16 (×3): qty 1

## 2013-10-16 MED ORDER — NITROGLYCERIN 0.4 MG SL SUBL: 0.4000 mg | SUBLINGUAL_TABLET | SUBLINGUAL | Status: DC | PRN

## 2013-10-16 MED ORDER — ASPIRIN EC 81 MG PO TBEC
81.0000 mg | DELAYED_RELEASE_TABLET | Freq: Every day | ORAL | Status: DC
  Administered 2013-10-17 – 2013-10-19 (×2): 81 mg via ORAL
  Filled 2013-10-16 (×3): qty 1

## 2013-10-16 MED ORDER — ACETAMINOPHEN 325 MG PO TABS: 650.0000 mg | ORAL_TABLET | ORAL | Status: DC | PRN

## 2013-10-16 MED ORDER — HEPARIN (PORCINE) IN NACL 100-0.45 UNIT/ML-% IJ SOLN
1200.0000 [IU]/h | INTRAMUSCULAR | Status: DC
  Administered 2013-10-16: 1000 [IU]/h via INTRAVENOUS
  Filled 2013-10-16 (×2): qty 250

## 2013-10-16 MED ORDER — PANTOPRAZOLE SODIUM 40 MG PO TBEC
40.0000 mg | DELAYED_RELEASE_TABLET | Freq: Every day | ORAL | Status: DC
  Administered 2013-10-17 – 2013-10-19 (×3): 40 mg via ORAL
  Filled 2013-10-16 (×3): qty 1

## 2013-10-16 MED ORDER — AMIODARONE HCL IN DEXTROSE 360-4.14 MG/200ML-% IV SOLN
INTRAVENOUS | Status: AC
Start: 2013-10-16 — End: 2013-10-18
  Administered 2013-10-16: 200 mL
  Filled 2013-10-16: qty 200

## 2013-10-16 MED ORDER — PROPOFOL 10 MG/ML IV BOLUS
0.5000 mg/kg | Freq: Once | INTRAVENOUS | Status: DC
Start: 2013-10-16 — End: 2013-10-16

## 2013-10-16 MED ORDER — ONDANSETRON HCL 4 MG/2ML IJ SOLN
4.0000 mg | Freq: Four times a day (QID) | INTRAMUSCULAR | Status: DC | PRN
  Administered 2013-10-19: 4 mg via INTRAVENOUS
  Filled 2013-10-16: qty 2

## 2013-10-16 MED ORDER — PROPOFOL 10 MG/ML IV BOLUS
INTRAVENOUS | Status: AC
  Filled 2013-10-16: qty 20

## 2013-10-16 MED ORDER — AMIODARONE HCL IN DEXTROSE 360-4.14 MG/200ML-% IV SOLN
INTRAVENOUS | Status: AC
  Administered 2013-10-16: 200 mL
  Filled 2013-10-16: qty 200

## 2013-10-16 MED ORDER — AMIODARONE HCL IN DEXTROSE 360-4.14 MG/200ML-% IV SOLN
60.0000 mg/h | INTRAVENOUS | Status: AC
  Administered 2013-10-16 (×2): 60 mg/h via INTRAVENOUS
  Filled 2013-10-16: qty 200

## 2013-10-16 MED ORDER — PROPOFOL 10 MG/ML IV BOLUS
INTRAVENOUS | Status: AC | PRN
  Administered 2013-10-16: 40 mg via INTRAVENOUS

## 2013-10-16 MED ORDER — PROPOFOL 10 MG/ML IV BOLUS
INTRAVENOUS | Status: AC
  Administered 2013-10-16: 40 mg
  Filled 2013-10-16: qty 20

## 2013-10-16 MED ORDER — AMIODARONE HCL IN DEXTROSE 360-4.14 MG/200ML-% IV SOLN
30.0000 mg/h | INTRAVENOUS | Status: DC
  Administered 2013-10-17 – 2013-10-18 (×3): 30 mg/h via INTRAVENOUS
  Filled 2013-10-16 (×7): qty 200

## 2013-10-16 MED ORDER — ONDANSETRON HCL 4 MG/2ML IJ SOLN
4.0000 mg | Freq: Once | INTRAMUSCULAR | Status: AC
  Administered 2013-10-16: 4 mg via INTRAVENOUS
  Filled 2013-10-16: qty 2
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
## 2013-10-16 NOTE — ED Notes (Signed)
Pt to ED via car with c/o rapid heart rate.  Pt st's he was hiking when heart started to race.  Pt denies any chest pain but st's he felt light headed.  Pt then drove self to ED.  On arrival to ED pt's heart rate was 208.

## 2013-10-16 NOTE — Sedation Documentation (Signed)
Pt cardioverted at 200Joules by Dr. Lovena Le

## 2013-10-16 NOTE — Sedation Documentation (Signed)
Pt alert and oriented x's 3, skin warm and dry, color appropriate.  Pt continues to deny any chest pain or other complaints.
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
## 2013-10-16 NOTE — ED Provider Notes (Signed)
I saw and evaluated the patient, reviewed the resident's note and I agree with the findings and plan.   EKG Interpretation   Date/Time:  Monday October 16 2013 15:58:53 EDT Ventricular Rate:  207 PR Interval:    QRS Duration: 206 QT Interval:  318 QTC Calculation: 590 R Axis:   -94 Text Interpretation:  Wide QRS tachycardia Right ventricular hypertrophy  Left ventricular hypertrophy with QRS widening and repolarization  abnormality Inferior infarct , age undetermined Anterolateral infarct ,  age undetermined Confirmed by Claritza July  MD, Lorris Carducci ([id number]) on 10/16/2013  4:05:06 PM     Patient presents with a wide complex tachycardia and a stable blood pressure. He is awake alert and able to give a full history. Had one prior episode of this when he was on the [place] trail where he had a cardioversion. Because of patient EKG that appears to be V. tach patient given an amiodarone bolus and cardiology contacted. The IV infiltrated without improvement in his heart rate. Adenosine was ineffective and cardiology recommended further a meal. No change in heart rate remained at 205 and mild drop in blood pressure today he after amiodarone started and the decision was made to cardiovert the patient. He was sedated with cardioversion as above with resolution back to sinus rhythm. Patient admitted for further care.  Procedural sedation Performed by: Blanchie Dessert Consent: Verbal consent obtained. Risks and benefits: risks, benefits and alternatives were discussed Required items: required blood products, implants, devices, and special equipment available Patient identity confirmed: arm band and provided demographic data Time out: Immediately prior to procedure a "time out" was called to verify the correct patient, procedure, equipment, support staff and site/side marked as required.  Sedation type: moderate (conscious) sedation NPO time confirmed and considedered  Sedatives: PROPOFOL  Physician  Time at Bedside: 20  Vitals: Vital signs were monitored during sedation. Cardiac Monitor, pulse oximeter Patient tolerance: Patient tolerated the procedure well with no immediate complications. Comments: Pt with uneventful recovered. Returned to pre-procedural sedation baseline   CRITICAL CARE Performed by: Blanchie Dessert Total critical care time: 45 Critical care time was exclusive of separately billable procedures and treating other patients. Critical care was necessary to treat or prevent imminent or life-threatening deterioration. Critical care was time spent personally by me on the following activities: development of treatment plan with patient and/or surrogate as well as nursing, discussions with consultants, evaluation of patient's response to treatment, examination of patient, obtaining history from patient or surrogate, ordering and performing treatments and interventions, ordering and review of laboratory studies, ordering and review of radiographic studies, pulse oximetry and re-evaluation of patient's condition.   Blanchie Dessert, MD 10/16/13 [phone]

## 2013-10-16 NOTE — ED Notes (Signed)
NOTIFIED DR. PLUNKETT OF PATIENTS LAB RESULTS OF I-STAT TROPONIN ,@16 :45 PM ,10/16/2013.

## 2013-10-16 NOTE — ED Notes (Signed)
Additional Amiodarone 150mg  bolus being given per Dr. Lovena Le.

## 2013-10-16 NOTE — ED Notes (Signed)
Additional Amiodarone 150mg  being given over 10 mins. Per cardiologist.  No change in heart rate at this time.  Pt remains alert and oriented x's 3.  Pt continues to deny any chest pain
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
## 2013-10-16 NOTE — ED Notes (Signed)
Pads placed on pt on arrival to ED.  Pt continues to be alert and oriented x's 3, denies any chest pain.  Only c/o feeling hot and sweaty.  Slight nausea.

## 2013-10-16 NOTE — Sedation Documentation (Signed)
B/P 89/68

## 2013-10-17 ENCOUNTER — Encounter (HOSPITAL_COMMUNITY)
Admission: EM | Disposition: A | Discharge status: Home / Self Care | Payer: Self-pay | Source: Home / Self Care | Attending: Internal Medicine

## 2013-10-17 ENCOUNTER — Encounter (HOSPITAL_COMMUNITY): Payer: Self-pay | Admitting: *Deleted

## 2013-10-17 DIAGNOSIS — I2510 Atherosclerotic heart disease of native coronary artery without angina pectoris: Secondary | ICD-10-CM

## 2013-10-17 HISTORY — PX: LEFT HEART CATHETERIZATION WITH CORONARY/GRAFT ANGIOGRAM: SHX5450

## 2013-10-17 LAB — CBC
HCT: 38.2 % — ABNORMAL LOW (ref 39.0–52.0)
Hemoglobin: 12.8 g/dL — ABNORMAL LOW (ref 13.0–17.0)
MCH: 32.7 pg (ref 26.0–34.0)
MCHC: 33.5 g/dL (ref 30.0–36.0)
MCV: 97.4 fL (ref 78.0–100.0)
PLATELETS: 130 10*3/uL — AB (ref 150–400)
RBC: 3.92 MIL/uL — ABNORMAL LOW (ref 4.22–5.81)
RDW: 14.1 % (ref 11.5–15.5)
WBC: 7.3 10*3/uL (ref 4.0–10.5)

## 2013-10-17 LAB — HEPARIN LEVEL (UNFRACTIONATED): Heparin Unfractionated: 0.23 IU/mL — ABNORMAL LOW (ref 0.30–0.70)

## 2013-10-17 LAB — MAGNESIUM: Magnesium: 1.6 mg/dL (ref 1.5–2.5)

## 2013-10-17 LAB — COMPREHENSIVE METABOLIC PANEL
ALT: 22 U/L (ref 0–53)
AST: 25 U/L (ref 0–37)
Albumin: 3.2 g/dL — ABNORMAL LOW (ref 3.5–5.2)
Alkaline Phosphatase: 75 U/L (ref 39–117)
BUN: 27 mg/dL — ABNORMAL HIGH (ref 6–23)
CO2: 24 mEq/L (ref 19–32)
Calcium: 8.2 mg/dL — ABNORMAL LOW (ref 8.4–10.5)
Chloride: 112 mEq/L (ref 96–112)
Creatinine, Ser: 2.27 mg/dL — ABNORMAL HIGH (ref 0.50–1.35)
GFR calc Af Amer: 35 mL/min — ABNORMAL LOW (ref >90)
GFR calc non Af Amer: 30 mL/min — ABNORMAL LOW (ref >90)
Glucose, Bld: 95 mg/dL (ref 70–99)
Potassium: 4.3 mEq/L (ref 3.7–5.3)
Sodium: 149 mEq/L — ABNORMAL HIGH (ref 137–147)
Total Bilirubin: <0.2 mg/dL — ABNORMAL LOW (ref 0.3–1.2)
Total Protein: 5.6 g/dL — ABNORMAL LOW (ref 6.0–8.3)

## 2013-10-17 LAB — TROPONIN I
Troponin I: 0.37 ng/mL (ref <0.30)
Troponin I: 0.4 ng/mL (ref <0.30)

## 2013-10-17 LAB — TSH: TSH: 2.49 u[IU]/mL (ref 0.350–4.500)

## 2013-10-17 SURGERY — LEFT HEART CATHETERIZATION WITH CORONARY/GRAFT ANGIOGRAM
Anesthesia: LOCAL

## 2013-10-17 MED ORDER — ASPIRIN 81 MG PO CHEW: 81.0000 mg | CHEWABLE_TABLET | ORAL | Status: DC

## 2013-10-17 MED ORDER — NITROGLYCERIN 0.2 MG/ML ON CALL CATH LAB
INTRAVENOUS | Status: AC
  Filled 2013-10-17: qty 1

## 2013-10-17 MED ORDER — MIDAZOLAM HCL 2 MG/2ML IJ SOLN
INTRAMUSCULAR | Status: AC
  Filled 2013-10-17: qty 2

## 2013-10-17 MED ORDER — SODIUM CHLORIDE 0.9 % IJ SOLN: 3.0000 mL | INTRAMUSCULAR | Status: DC | PRN

## 2013-10-17 MED ORDER — SODIUM CHLORIDE 0.9 % IV SOLN
1.0000 mL/kg/h | INTRAVENOUS | Status: AC
  Administered 2013-10-17: 1 mL/kg/h via INTRAVENOUS

## 2013-10-17 MED ORDER — ZOLPIDEM TARTRATE 5 MG PO TABS
5.0000 mg | ORAL_TABLET | Freq: Once | ORAL | Status: AC
  Administered 2013-10-18: 5 mg via ORAL

## 2013-10-17 MED ORDER — SODIUM CHLORIDE 0.9 % IV SOLN: INTRAVENOUS | Status: DC

## 2013-10-17 MED ORDER — SODIUM CHLORIDE 0.9 % IV SOLN: 250.0000 mL | INTRAVENOUS | Status: DC | PRN

## 2013-10-17 MED ORDER — LIDOCAINE HCL (PF) 1 % IJ SOLN
INTRAMUSCULAR | Status: AC
  Filled 2013-10-17: qty 30

## 2013-10-17 MED ORDER — FENTANYL CITRATE 0.05 MG/ML IJ SOLN
INTRAMUSCULAR | Status: AC
  Filled 2013-10-17: qty 2

## 2013-10-17 MED ORDER — SODIUM CHLORIDE 0.9 % IJ SOLN: 3.0000 mL | Freq: Two times a day (BID) | INTRAMUSCULAR | Status: DC

## 2013-10-17 MED ORDER — HEPARIN SODIUM (PORCINE) 5000 UNIT/ML IJ SOLN
5000.0000 [IU] | Freq: Three times a day (TID) | INTRAMUSCULAR | Status: DC
  Administered 2013-10-17 – 2013-10-19 (×4): 5000 [IU] via SUBCUTANEOUS
  Filled 2013-10-17 (×9): qty 1

## 2013-10-17 MED ORDER — HEPARIN (PORCINE) IN NACL 2-0.9 UNIT/ML-% IJ SOLN
INTRAMUSCULAR | Status: AC
  Filled 2013-10-17: qty 1000

## 2013-10-17 NOTE — Interval H&P Note (Signed)
History and Physical Interval Note:  10/17/2013 9:34 AM  Richard Cook  has presented today for surgery, with the diagnosis of V-Tach  The various methods of treatment have been discussed with the patient and family. After consideration of risks, benefits and other options for treatment, the patient has consented to  Procedure(s): LEFT HEART CATHETERIZATION WITH CORONARY/GRAFT ANGIOGRAM (N/A) as a surgical intervention .  The patient's history has been reviewed, patient examined, no change in status, stable for surgery.  I have reviewed the patient's chart and labs.  Questions were answered to the patient's satisfaction.   Cath Lab Visit (complete for each Cath Lab visit)  Clinical Evaluation Leading to the Procedure:   ACS: no  Non-ACS:    Anginal Classification: No Symptoms  Anti-ischemic medical therapy: Minimal Therapy (1 class of medications)  Non-Invasive Test Results: No non-invasive testing performed  Prior CABG: Previous CABG        Richard Cook [facility] 10/17/2013 9:34 AM

## 2013-10-17 NOTE — Progress Notes (Signed)
CRITICAL VALUE ALERT  Critical value received: troponin 0.40  Date of notification:  10/17/2013  Time of notification:  0030  Critical value read back:yes  Nurse who received alert:  Purcell Nails   MD notified (1st page):  Dr Colon Flattery  Time of first page:  [phone] MD notified (2nd page):  Time of second page:  Responding MD:  Dr Colon Flattery  Time MD responded:  [phone]

## 2013-10-17 NOTE — Progress Notes (Signed)
ANTICOAGULATION CONSULT NOTE - Follow Up Consult  Pharmacy Consult for heparin Indication: chest pain/ACS  Labs:  Recent Labs  10/16/13 1630 10/16/13 1812 10/16/13 2340 10/17/13 0243  HGB 14.3  --   --   --   HCT 41.5  --   --   --   PLT 222  --   --   --   APTT 31  --   --   --   LABPROT 13.6  --   --   --   INR 1.06  --   --   --   HEPARINUNFRC  --   --   --  0.23*  CREATININE 2.51*  --   --   --   TROPONINI  --  <0.30 0.40*  --     Assessment: 58yo male subtherapeutic on heparin with initial dosing for CP, initial troponin negative, now rising.  Goal of Therapy:  Heparin level 0.3-0.7 units/ml   Plan:  Will increase heparin gtt by ~2 units/kg/hr to 1200 units/hr and check level in [place], PharmD, BCPS  10/17/2013,4:34 AM

## 2013-10-17 NOTE — CV Procedure (Signed)
   Cardiac Catheterization Procedure Note  Name: Richard Cook MRN: [id number] DOB: [birth year]  Procedure: Left Heart Cath, Selective Coronary Angiography, SVG angiography, bilateral IMA angiography  Indication: 58 yo WM presents with sustained VT. He has a history of CAD s/p MI in 2013 with stenting of the RCA. Subsequent CABG in [place].    Procedural details: The right groin was prepped, draped, and anesthetized with 1% lidocaine. Using modified Seldinger technique, a 5 French sheath was introduced into the right femoral artery. Standard Judkins catheters were used for coronary angiography and graft angiography. The RIMA graft was very difficult to engage and was finally engaged with a Bernstein 90 degree catheter.  Catheter exchanges were performed over a guidewire. There were no immediate procedural complications. The patient was transferred to the post catheterization recovery area for further monitoring. 105 cc on contrast was used.  Procedural Findings: Hemodynamics:  AO 166/97 mean 124 mm Hg LV 161/33 mm Hg   Coronary angiography: Coronary dominance: right  Left mainstem: 70% distal left main stenosis.  Left anterior descending (LAD): No significant obstructive disease. There is competitive flow in the LAD from the graft. The first diagonal also has competitive flow from a graft.  Left circumflex (LCx): No obstructive disease. The first OM has competitive filling from a graft.   Right coronary artery (RCA): The RCA is a dominant vessel. It has no significant disease. The stent in the mid vessel is widely patent.  SVG to the OM1 is widely patent and supplies the entire LCx.  LIMA to the first diagonal is widely patent.  RIMA to the LAD is widely patent.   Left ventriculography: Not done  Final Conclusions:   1. Left main obstructive CAD 2. Continued patency of the RCA stent. 3. All grafts are patent including SVG to OM1, LIMA to the diagonal, RIMA to the  LAD 4. Elevated LV filling pressures.  Recommendations: Obtain Echo to assess LV function. Hydrate and follow renal function closely. Further recommendations per EP.  [facility] 10/17/2013, 11:01 AM

## 2013-10-17 NOTE — Progress Notes (Addendum)
Patient ID: Richard Cook, male   DOB: November 02, 1955, 58 y.o.   MRN: [id number]   Patient Name: Richard Cook Date of Encounter: 10/17/2013     Principal Problem:   VT (ventricular tachycardia) Active Problems:   CAD- PCI to RCA 08/10/11, CABG in [place] 5/14   Hypercholesterolemia   GERD (gastroesophageal reflux disease)   Hypotension   Acute on chronic renal insufficiency   Chronic renal impairment, stage 3 (moderate)    SUBJECTIVE No chest pain or sob. No edema.   CURRENT MEDS . aspirin EC  81 mg Oral Daily  . atorvastatin  40 mg Oral q1800  . heparin  5,000 Units Subcutaneous 3 times per day  . metoprolol tartrate  12.5 mg Oral BID  . pantoprazole  40 mg Oral Q0600    OBJECTIVE  Filed Vitals:   10/17/13 1700 10/17/13 1704 10/17/13 1800 10/17/13 2000  BP: 127/71  153/90 157/89  Pulse: 57     Temp:  98.5 F (36.9 C)    TempSrc:  Oral    Resp: 16  15 16   Height:      Weight:      SpO2: 96%   97%    Intake/Output Summary (Last 24 hours) at 10/17/13 2101 Last data filed at 10/17/13 1900  Gross per 24 hour  Intake 1780.48 ml  Output   1505 ml  Net 275.48 ml   Filed Weights   10/16/13 1616  Weight: 185 lb (83.915 kg)    PHYSICAL EXAM  General: Pleasant, NAD. Neuro: Alert and oriented X 3. Moves all extremities spontaneously HEENT:  Normal  Neck: Supple without bruits or JVD. Lungs:  Resp regular and unlabored, CTA. Heart: RRR no s3, s4, or murmurs. Abdomen: Soft, non-tender, non-distended, BS + x 4.  Extremities: No clubbing, cyanosis or edema. DP/PT/Radials 2+ and equal bilaterally.  Accessory Clinical Findings  CBC  Recent Labs  10/16/13 1630 10/17/13 1315  WBC 11.8* 7.3  NEUTROABS 8.3*  --   HGB 14.3 12.8*  HCT 41.5 38.2*  MCV 95.0 97.4  PLT 222 [id number]*   Basic Metabolic Panel  Recent Labs  10/16/13 1630 10/17/13 0243  NA 145 149*  K 4.7 4.3  CL 107 112  CO2 22 24  GLUCOSE 111* 95  BUN 27* 27*  CREATININE 2.51* 2.27*   CALCIUM 9.1 8.2*  MG 1.7 1.6  PHOS 3.4  --    Liver Function Tests  Recent Labs  10/16/13 1630 10/17/13 0243  AST 20 25  ALT 18 22  ALKPHOS 87 75  BILITOT 0.4 <0.2*  PROT 6.9 5.6*  ALBUMIN 3.8 3.2*   No results found for this basename: LIPASE, AMYLASE,  in the last 72 hours Cardiac Enzymes  Recent Labs  10/16/13 1812 10/16/13 2340 10/17/13 0243  TROPONINI <0.30 0.40* 0.37*   BNP No components found with this basename: POCBNP,  D-Dimer No results found for this basename: DDIMER,  in the last 72 hours Hemoglobin A1C No results found for this basename: HGBA1C,  in the last 72 hours Fasting Lipid Panel No results found for this basename: CHOL, HDL, LDLCALC, TRIG, CHOLHDL, LDLDIRECT,  in the last 72 hours Thyroid Function Tests  Recent Labs  10/17/13 0243  TSH 2.490    TELE NSR  ECG NSR  Radiology/Studies  No results found.  ASSESSMENT AND PLAN 1. Sustained MMVT 2. ICM, with h/o LV dysfunction 3. Elevated LV filling pressures 4. Chronic renal insufficiency Rec: Await 2D echo. He will need  ICD implant. Will tentatively plan for this tomorrow. He will need to be continued on amiodarone.  Will follow renal function.  Jennilee Demarco,M.D.  10/17/2013 9:01 PM

## 2013-10-17 NOTE — Progress Notes (Signed)
UR Completed.  Richard Cook [id number] 10/17/2013

## 2013-10-18 ENCOUNTER — Encounter (HOSPITAL_COMMUNITY)
Admission: EM | Disposition: A | Discharge status: Home / Self Care | Payer: Self-pay | Source: Home / Self Care | Attending: Internal Medicine

## 2013-10-18 DIAGNOSIS — I472 Ventricular tachycardia: Secondary | ICD-10-CM

## 2013-10-18 DIAGNOSIS — I369 Nonrheumatic tricuspid valve disorder, unspecified: Secondary | ICD-10-CM

## 2013-10-18 DIAGNOSIS — I4729 Other ventricular tachycardia: Secondary | ICD-10-CM

## 2013-10-18 HISTORY — PX: IMPLANTABLE CARDIOVERTER DEFIBRILLATOR IMPLANT: SHX5473

## 2013-10-18 LAB — CBC
HEMATOCRIT: 40.7 % (ref 39.0–52.0)
Hemoglobin: 13.6 g/dL (ref 13.0–17.0)
MCH: 32.3 pg (ref 26.0–34.0)
MCHC: 33.4 g/dL (ref 30.0–36.0)
MCV: 96.7 fL (ref 78.0–100.0)
PLATELETS: 147 10*3/uL — AB (ref 150–400)
RBC: 4.21 MIL/uL — AB (ref 4.22–5.81)
RDW: 13.7 % (ref 11.5–15.5)
WBC: 8.8 10*3/uL (ref 4.0–10.5)

## 2013-10-18 SURGERY — IMPLANTABLE CARDIOVERTER DEFIBRILLATOR IMPLANT
Anesthesia: LOCAL

## 2013-10-18 MED ORDER — SODIUM CHLORIDE 0.9 % IV SOLN: INTRAVENOUS | Status: AC

## 2013-10-18 MED ORDER — GENTAMICIN SULFATE 40 MG/ML IJ SOLN
80.0000 mg | INTRAMUSCULAR | Status: DC
  Filled 2013-10-18: qty 2

## 2013-10-18 MED ORDER — AMLODIPINE BESYLATE 5 MG PO TABS
5.0000 mg | ORAL_TABLET | Freq: Every day | ORAL | Status: DC
  Administered 2013-10-18 – 2013-10-19 (×2): 5 mg via ORAL
  Filled 2013-10-18 (×2): qty 1

## 2013-10-18 MED ORDER — HYDROCODONE-ACETAMINOPHEN 5-325 MG PO TABS
1.0000 | ORAL_TABLET | Freq: Four times a day (QID) | ORAL | Status: DC | PRN
  Administered 2013-10-18 – 2013-10-19 (×3): 2 via ORAL
  Filled 2013-10-18 (×2): qty 2

## 2013-10-18 MED ORDER — HYDRALAZINE HCL 20 MG/ML IJ SOLN
INTRAMUSCULAR | Status: AC
  Filled 2013-10-18: qty 1

## 2013-10-18 MED ORDER — LIDOCAINE HCL (PF) 1 % IJ SOLN
INTRAMUSCULAR | Status: AC
  Filled 2013-10-18: qty 30

## 2013-10-18 MED ORDER — MIDAZOLAM HCL 5 MG/5ML IJ SOLN
INTRAMUSCULAR | Status: AC
  Filled 2013-10-18: qty 5

## 2013-10-18 MED ORDER — ONDANSETRON HCL 4 MG/2ML IJ SOLN: 4.0000 mg | Freq: Four times a day (QID) | INTRAMUSCULAR | Status: DC | PRN

## 2013-10-18 MED ORDER — LIDOCAINE HCL (PF) 1 % IJ SOLN
INTRAMUSCULAR | Status: AC
  Filled 2013-10-18: qty 60

## 2013-10-18 MED ORDER — ACETAMINOPHEN 325 MG PO TABS: 325.0000 mg | ORAL_TABLET | ORAL | Status: DC | PRN

## 2013-10-18 MED ORDER — HYDROCODONE-ACETAMINOPHEN 5-325 MG PO TABS
ORAL_TABLET | ORAL | Status: AC
  Filled 2013-10-18: qty 2

## 2013-10-18 MED ORDER — FENTANYL CITRATE 0.05 MG/ML IJ SOLN
INTRAMUSCULAR | Status: AC
  Filled 2013-10-18: qty 2

## 2013-10-18 MED ORDER — CARVEDILOL 3.125 MG PO TABS
3.1250 mg | ORAL_TABLET | Freq: Two times a day (BID) | ORAL | Status: DC
  Administered 2013-10-18 – 2013-10-19 (×2): 3.125 mg via ORAL
  Filled 2013-10-18 (×4): qty 1

## 2013-10-18 MED ORDER — CEFAZOLIN SODIUM-DEXTROSE 2-3 GM-% IV SOLR
2.0000 g | INTRAVENOUS | Status: DC
  Filled 2013-10-18: qty 50

## 2013-10-18 MED ORDER — CEFAZOLIN SODIUM 1-5 GM-% IV SOLN
1.0000 g | Freq: Four times a day (QID) | INTRAVENOUS | Status: AC
  Administered 2013-10-18 – 2013-10-19 (×3): 1 g via INTRAVENOUS
  Filled 2013-10-18 (×3): qty 50

## 2013-10-18 MED ORDER — HYDRALAZINE HCL 20 MG/ML IJ SOLN
10.0000 mg | Freq: Once | INTRAMUSCULAR | Status: AC
  Administered 2013-10-18: 10 mg via INTRAVENOUS

## 2013-10-18 NOTE — Interval H&P Note (Signed)
ICD Criteria  Current LVEF:50% ;Obtained > 6 months ago.   NYHA Functional Classification: Class I  Heart Failure History:  No.  Non-Ischemic Dilated Cardiomyopathy History:  No.  Atrial Fibrillation/Atrial Flutter:  No.  Ventricular Tachycardia History:  Yes, No hemodynamic instability. VT Type:  SVT - Monomorphic.  Cardiac Arrest History:  No  History of Syndromes with Risk of Sudden Death:  No.  Previous ICD:  No.  Electrophysiology Study: No.  Prior MI: No.  PPM: No.  OSA:  No  Patient Life Expectancy of >=1 year: Yes.  Anticoagulation Therapy:  Patient is NOT on anticoagulation therapy.   Beta Blocker Therapy:  Yes.   Ace Inhibitor/ARB Therapy:  No, Reason not on Ace Inhibitor/ARB therapy:  renal insufficiency and reasonable EFHistory and Physical Interval Note:  10/18/2013 8:30 AM  Richard Cook  has presented today for surgery, with the diagnosis of bradicardia  The various methods of treatment have been discussed with the patient and family. After consideration of risks, benefits and other options for treatment, the patient has consented to  Procedure(s): IMPLANTABLE CARDIOVERTER DEFIBRILLATOR IMPLANT (N/A) as a surgical intervention .  The patient's history has been reviewed, patient examined, no change in status, stable for surgery.  I have reviewed the patient's chart and labs.  Questions were answered to the patient's satisfaction.     Virl Axe

## 2013-10-18 NOTE — Progress Notes (Signed)
Pharmacist Heart Failure Core Measure Documentation  Assessment: Richard Cook has an EF documented as 30-35% on 10/18/13 by Echo.  Rationale: Heart failure patients with left ventricular systolic dysfunction (LVSD) and an EF < 40% should be prescribed an angiotensin converting enzyme inhibitor (ACEI) or angiotensin receptor blocker (ARB) at discharge unless a contraindication is documented in the medical record.  This patient is not currently on an ACEI or ARB for HF.  This note is being placed in the record in order to provide documentation that a contraindication to the use of these agents is present for this encounter.  ACE Inhibitor or Angiotensin Receptor Blocker is contraindicated (specify all that apply)  []   ACEI allergy AND ARB allergy []   Angioedema []   Moderate or severe aortic stenosis []   Hyperkalemia []   Hypotension []   Renal artery stenosis [x]   Worsening renal function, preexisting renal disease or dysfunction   Norva Riffle 10/18/2013 3:40 PM

## 2013-10-18 NOTE — CV Procedure (Signed)
Richard Cook [id number]  [id number]  Preop Dx:Ventricular tachycardia, hemodynamically tolerated, Ischemic heart disease Postop Dx same/   Procedure: single chamber ICD implantation without intraoperative defibrillation threshold testing  Following the obtaining of informed consent the patient was brought to the electrophysiology laboratory in place of the fluoroscopic table in the supine position. After routine prep and drape, lidocaine was infiltrated in the prepectoral subclavicular region and an incision was made and carried down to the layer of the prepectoral fascia using electrocautery and sharp dissection. A pocket was formed similarly.  Thereafter an attention was turned to gain access to the extrathoracic left subclavian vein which was accomplished without difficulty and without the aspiration of air or puncture of the artery. A 9 French sheath was placed for which was then passed a  Single coil  active fixation defibrillator lead, model [employer]  [phone] serial number [id number]. It was  passed under fluoroscopic guidance to the right ventricular apex. In its location the bipolar R wave was 6.6 millivolts, impedance was 641 ohms, the pacing threshold was 0.6 volts at 0.5 msec. Current at threshold was 1.0 mA.  There was no diaphragmatic pacing at 10 V. The current of injury was brisk I should note multiple sites in the RV apex were mapped and we ulitmately had to deploy the lead on the distal septum.  The lead was secured to the prepectoral fascia and then attached to a Medtronic  ICD, serial number  [id number]  [id number] device, the bipolar R wave was 6.0llivolts, impedance was 441ms, the pacing threshold was 0.5lts at 0.100msec. High-voltage impedance of 68hms.  The pocket was copiously irrigated with antibiotic containing saline solution. Hemostasis was assured, and the device and the lead was placed in the pocket and secured to the prepectoral fascia.  The wound is closed in 3  layers in normal fashion. The wound is washed dried and a Dermabond dressing waapplied. Needle counts sponge counts and instrument counts were correct at the end of the procedure according to the staff.     Patient tolerated the procedure without apparent complication      Cx: None     Virl Axe, MD 10/18/2013 12:50 PM

## 2013-10-18 NOTE — H&P (View-Only) (Signed)
Patient ID: Richard Cook, male   DOB: 05/14/1956, 58 y.o.   MRN: [id number]   Patient Name: Richard Cook Date of Encounter: 10/17/2013     Principal Problem:   VT (ventricular tachycardia) Active Problems:   CAD- PCI to RCA 08/10/11, CABG in [place] 5/14   Hypercholesterolemia   GERD (gastroesophageal reflux disease)   Hypotension   Acute on chronic renal insufficiency   Chronic renal impairment, stage 3 (moderate)    SUBJECTIVE No chest pain or sob. No edema.   CURRENT MEDS . aspirin EC  81 mg Oral Daily  . atorvastatin  40 mg Oral q1800  . heparin  5,000 Units Subcutaneous 3 times per day  . metoprolol tartrate  12.5 mg Oral BID  . pantoprazole  40 mg Oral Q0600    OBJECTIVE  Filed Vitals:   10/17/13 1700 10/17/13 1704 10/17/13 1800 10/17/13 2000  BP: 127/71  153/90 157/89  Pulse: 57     Temp:  98.5 F (36.9 C)    TempSrc:  Oral    Resp: 16  15 16   Height:      Weight:      SpO2: 96%   97%    Intake/Output Summary (Last 24 hours) at 10/17/13 2101 Last data filed at 10/17/13 1900  Gross per 24 hour  Intake 1780.48 ml  Output   1505 ml  Net 275.48 ml   Filed Weights   10/16/13 1616  Weight: 185 lb (83.915 kg)    PHYSICAL EXAM  General: Pleasant, NAD. Neuro: Alert and oriented X 3. Moves all extremities spontaneously HEENT:  Normal  Neck: Supple without bruits or JVD. Lungs:  Resp regular and unlabored, CTA. Heart: RRR no s3, s4, or murmurs. Abdomen: Soft, non-tender, non-distended, BS + x 4.  Extremities: No clubbing, cyanosis or edema. DP/PT/Radials 2+ and equal bilaterally.  Accessory Clinical Findings  CBC  Recent Labs  10/16/13 1630 10/17/13 1315  WBC 11.8* 7.3  NEUTROABS 8.3*  --   HGB 14.3 12.8*  HCT 41.5 38.2*  MCV 95.0 97.4  PLT 222 [id number]*   Basic Metabolic Panel  Recent Labs  10/16/13 1630 10/17/13 0243  NA 145 149*  K 4.7 4.3  CL 107 112  CO2 22 24  GLUCOSE 111* 95  BUN 27* 27*  CREATININE 2.51* 2.27*   CALCIUM 9.1 8.2*  MG 1.7 1.6  PHOS 3.4  --    Liver Function Tests  Recent Labs  10/16/13 1630 10/17/13 0243  AST 20 25  ALT 18 22  ALKPHOS 87 75  BILITOT 0.4 <0.2*  PROT 6.9 5.6*  ALBUMIN 3.8 3.2*   No results found for this basename: LIPASE, AMYLASE,  in the last 72 hours Cardiac Enzymes  Recent Labs  10/16/13 1812 10/16/13 2340 10/17/13 0243  TROPONINI <0.30 0.40* 0.37*   BNP No components found with this basename: POCBNP,  D-Dimer No results found for this basename: DDIMER,  in the last 72 hours Hemoglobin A1C No results found for this basename: HGBA1C,  in the last 72 hours Fasting Lipid Panel No results found for this basename: CHOL, HDL, LDLCALC, TRIG, CHOLHDL, LDLDIRECT,  in the last 72 hours Thyroid Function Tests  Recent Labs  10/17/13 0243  TSH 2.490    TELE NSR  ECG NSR  Radiology/Studies  No results found.  ASSESSMENT AND PLAN 1. Sustained MMVT 2. ICM, with h/o LV dysfunction 3. Elevated LV filling pressures 4. Chronic renal insufficiency Rec: Await 2D echo. He will need  ICD implant. Will tentatively plan for this tomorrow. He will need to be continued on amiodarone.  Will follow renal function.  Lauranne Beyersdorf,M.D.  10/17/2013 9:01 PM

## 2013-10-18 NOTE — Progress Notes (Signed)
Clinical Social Work Department BRIEF PSYCHOSOCIAL ASSESSMENT 10/18/2013  Patient:  Richard Cook, Richard Cook     Account Number:  [id number]     Admit date:  10/16/2013  Clinical Social Worker:  Freeman Caldron  Date/Time:  10/18/2013 01:33 PM  Referred by:  RN  Date Referred:  10/18/2013 Referred for  Other - See comment   Other Referral:   Applying for disability; medicaid potential.   Interview type:  Other - See comment Other interview type:   Received referral from RN    PSYCHOSOCIAL DATA Living Status:  ALONE Admitted from facility:   Level of care:   Primary support name:  Gertie Gowda [phone]) Primary support relationship to patient:  SIBLING Degree of support available:   Unassessed.    CURRENT CONCERNS Current Concerns  Other - See comment   Other Concerns:   Financial concerns/disability application    SOCIAL WORK ASSESSMENT / PLAN I received consult from pt's RN that he is applying for disability and needs assistance. I called the financial counselor and informed her pt is Medicaid potential and requesting help with disability application. Financial counselor will follow up with patient. I am signing off.   Assessment/plan status:  No Further Intervention Required Other assessment/ plan:   Information/referral to community resources:   Development worker, community to follow up with pt to assist with disability application and follow up concerning Medicaid status.    PATIENT'S/FAMILY'S RESPONSE TO PLAN OF CARE: N/A       Ky Barban, MSW, [employer] Clinical Social Worker [phone]

## 2013-10-18 NOTE — Discharge Summary (Signed)
ELECTROPHYSIOLOGY DISCHARGE SUMMARY   Patient ID: Richard Cook,  MRN: [id number], DOB/AGE: [birth year] 58 y.o.  Admit date: 10/16/2013 Discharge date: 10/19/2013  Primary Care Physician: Lelon Huh, MD Primary Cardiologist: previously Lia Foyer, MD Primary EP: Lovena Le, MD  Primary Discharge Diagnosis:  1. Sustained monomorphic VT 2. Ischemic CM, EF 30%, s/p ICD implantation for secondary prevention SCD  Secondary Discharge Diagnoses:  1. CAD s/p STEMI and PCI with BMS to RCA Jan 2013, s/p CABG April 2014  2. HTN 3. Dyslipidemia 4. Rheumatoid arthritis 5. Chronic renal insufficiency 6. OSA  Procedures This Admission:   1. 2D echocardiogram 10/18/2013 Study Conclusions - Left ventricle: The cavity size was mildly dilated. Wall thickness was increased in a pattern of mild LVH. The inferior wall is thin and echogenic, suggestive of scar. Systolic function was moderately to severely reduced. The estimated ejection fraction was in the range of 30% to 35%. Severe global hypokinesis with inferior akinesis. Doppler parameters are consistent with abnormal left ventricular relaxation (grade 1 diastolic dysfunction). The E/e' ratio is >10, suggesting elevated LV filling pressure. - Left atrium: The atrium was normal in size. - Right atrium: The atrium was mildly dilated. - Tricuspid valve: Midly dilated annulus. Mild regurgitation. - Pulmonary arteries: PA peak pressure: 55mm Hg (S). - Systemic veins: The IVC was not well visualized. - Pericardium, extracardiac: There was no pericardial effusion.  2. Cardiac catheterization 10/17/2013 Procedural Findings:  Hemodynamics:  AO 166/97 mean 124 mm Hg  LV 161/33 mm Hg  Coronary angiography:  Coronary dominance: right  Left mainstem: 70% distal left main stenosis.  Left anterior descending (LAD): No significant obstructive disease. There is competitive flow in the LAD from the graft. The first diagonal also has competitive  flow from a graft.  Left circumflex (LCx): No obstructive disease. The first OM has competitive filling from a graft.  Right coronary artery (RCA): The RCA is a dominant vessel. It has no significant disease. The stent in the mid vessel is widely patent.  SVG to the OM1 is widely patent and supplies the entire LCx.  LIMA to the first diagonal is widely patent.  RIMA to the LAD is widely patent.  Left ventriculography: Not done  Final Conclusions:  1. Left main obstructive CAD  2. Continued patency of the RCA stent.  3. All grafts are patent including SVG to OM1, LIMA to the diagonal, RIMA to the LAD  4. Elevated LV filling pressures.  3. Single chamber ICD implantation 10/18/2013  RV lead - [employer] single coil active fixation defibrillator lead, model [id number] serial number [id number] Device - Medtronic ICD, serial number [id number]  History and Hospital Course:  [name] is a 58 year old man with known CAD s/p STEMI Jan 2013 and underwent PCI with BMS to RCA. LVEF 45% with moderate residual LAD and LM disease. He last saw Dr Lia Foyer in April 2013. While hiking the [place] trail in April 2014 he had tachycardia. He says he was cardioverted in the field at an aid station and was then admitted to the hospital in [place], [place]. He ultimately underwent CABG during that admission. Records from that time suggest he was placed on amiodarone in addition to BB. Since then he has been admitted to [facility] in Aug 2014 for LLE cellulitis and in Nov 2014 for RLE cellulitis. In November he was noted to have renal insufficiency with a SCr of 2.82, up from 1.5 in August. He has since stopped all his heart medications  except aspirin. On the day of admission he was hiking near [facility] when he had recurrent tachycardia. He presented to the ED in [place] with a rate of 200 bpm and a SBP 80-90. Potassium 4.7, magnesium 1.7. TSH 2.49. On exam he had evidence of Canon A waves and intravenous adenosine  was given which had no effect on the tachycardia, confirming a diagnosis of ventricular tachycardia. He was given a total of 300 mg of intravenous amiodarone which slowed the tachycardia but did not terminate it. He was then given intravenous propofol and cardioverted with 200 J of synchronized energy, restoring sinus rhythm. He was admitted for additional evaluation. On 10/17/2013 he underwent cardiac catheterization which revealed 3/3 grafts patent and patent RCA stent. An echo was done revealing moderate LV dysfunction, EF 30-35%. On 10/18/2012 he underwent single chamber ICD implantation for secondary prevention SCD. Mr. Sly tolerated this procedure well without any immediate complication. He remains hemodynamically stable and afebrile. His chest xray shows stable lead placement without pneumothorax. His device interrogation shows normal ICD function with stable lead parameters/measurements. His implant site is intact without significant bleeding or hematoma. He has been given discharge instructions including wound care and activity restrictions. He will follow-up in 7-10 days for wound check and labs (BMET). He was instructed not to drive for 6 months until given clearance by Dr. Lovena Le. He has been seen, examined and deemed stable for discharge today by Dr. Jolyn Nap.  Discharge Vitals: Blood pressure 145/97, pulse 65, temperature 98.4 F (36.9 C), temperature source Oral, resp. rate 18, height 5\' 10"  (1.778 m), weight 210 lb 1.6 oz (95.301 kg), SpO2 99.00%.   Labs: Lab Results  Component Value Date   WBC PENDING 10/19/2013   HGB 13.9 10/19/2013   HCT 40.3 10/19/2013   MCV 93.7 10/19/2013   PLT 131* 10/19/2013     Recent Labs Lab 10/17/13 0243  NA 149*  K 4.3  CL 112  CO2 24  BUN 27*  CREATININE 2.27*  CALCIUM 8.2*  PROT 5.6*  BILITOT <0.2*  ALKPHOS 75  ALT 22  AST 25  GLUCOSE 95   Lab Results  Component Value Date   CKTOTAL 2209* 08/11/2011   CKMB 389.2* 08/11/2011   TROPONINI  0.37* 10/17/2013     Recent Labs  10/16/13 1630  INR 1.06    Disposition:  The patient is being discharged in stable condition.  Follow-up:     Follow-up Information   Follow up with [facility] On 10/26/2013. (At 11:00 AM for wound check and labs (BMET))    Specialty:  Cardiology   Contact information:   [address], [place] [address] [phone]      Follow up with Virl Axe, MD On 01/22/2014. (At 10:30 AM)    Specialty:  Cardiology   Contact information:   [id number] [place] [address] [phone]      Discharge Medications:    Medication List    STOP taking these medications       amiodarone 200 MG tablet  Commonly known as:  PACERONE     clopidogrel 75 MG tablet  Commonly known as:  PLAVIX     rosuvastatin 20 MG tablet  Commonly known as:  CRESTOR      TAKE these medications       allopurinol 300 MG tablet  Commonly known as:  ZYLOPRIM  Take 300 mg by mouth daily.  aspirin EC 81 MG tablet  Take 81 mg by mouth daily.     atorvastatin 40 MG tablet  Commonly known as:  LIPITOR  Take 1 tablet (40 mg total) by mouth daily at 6 PM.     carvedilol 6.25 MG tablet  Commonly known as:  COREG  Take 1 tablet (6.25 mg total) by mouth every 12 (twelve) hours.     clonazePAM 2 MG tablet  Commonly known as:  KLONOPIN  Take 2 mg by mouth 3 (three) times daily as needed for anxiety.     diphenhydrAMINE 25 MG tablet  Commonly known as:  BENADRYL  Take 25 mg by mouth every 6 (six) hours as needed for allergies.     doxycycline 100 MG capsule  Commonly known as:  VIBRAMYCIN  Take 1 capsule (100 mg total) by mouth 2 (two) times daily.     HYDROcodone-acetaminophen 7.5-325 MG per tablet  Commonly known as:  NORCO  Take 1-2 tablets by mouth every 6 (six) hours as needed for pain.     lisinopril 5 MG tablet  Commonly known as:  PRINIVIL  Take 1 tablet (5 mg total) by mouth daily.         Duration of Discharge Encounter: Greater than 30 minutes including physician time.  Signed, Ileene Hutchinson, PA-C 10/19/2013, 10:45 AM

## 2013-10-18 NOTE — Progress Notes (Signed)
Echo Lab  2D Echocardiogram completed.  [place], RDCS 10/18/2013 9:53 AM

## 2013-10-19 ENCOUNTER — Inpatient Hospital Stay (HOSPITAL_COMMUNITY): Payer: Self-pay

## 2013-10-19 LAB — CBC
HCT: 40.3 % (ref 39.0–52.0)
Hemoglobin: 13.9 g/dL (ref 13.0–17.0)
MCH: 32.3 pg (ref 26.0–34.0)
MCHC: 34.5 g/dL (ref 30.0–36.0)
MCV: 93.7 fL (ref 78.0–100.0)
PLATELETS: 131 10*3/uL — AB (ref 150–400)
RBC: 4.3 MIL/uL (ref 4.22–5.81)
RDW: 13.5 % (ref 11.5–15.5)
WBC: 6.9 10*3/uL (ref 4.0–10.5)

## 2013-10-19 MED ORDER — ATORVASTATIN CALCIUM 40 MG PO TABS: 40.0000 mg | ORAL_TABLET | Freq: Every day | ORAL | Status: DC

## 2013-10-19 MED ORDER — LISINOPRIL 5 MG PO TABS: 5.0000 mg | ORAL_TABLET | Freq: Every day | ORAL | Status: DC

## 2013-10-19 MED ORDER — CARVEDILOL 6.25 MG PO TABS: 6.2500 mg | ORAL_TABLET | Freq: Two times a day (BID) | ORAL | Status: DC

## 2013-10-19 NOTE — Discharge Instructions (Signed)
° °  Supplemental Discharge Instructions for  Pacemaker/Defibrillator Patients  Activity No heavy lifting or vigorous activity with your left/right arm for 6 to 8 weeks.  Do not raise your left/right arm above your head for one week.  Gradually raise your affected arm as drawn below.           03/22                     03/23                      03/24                     03/25       NO DRIVING for 6 months until given clearance by Dr. [name]. WOUND CARE   Keep the wound area clean and dry.  You may shower but no soaking in tub bath, swimming pool or hot tub for 7-10 days until wound completely healed. .   The Dermabond (glue) on your wound will fall off on its own; do not pull it off.  No bandage is needed on the site.  DO  NOT apply any creams, oils, or ointments to the wound area.   If you notice any drainage or discharge from the wound, any swelling or bruising at the site, or you develop a fever > 101? F after you are discharged home, call the office at once.  Special Instructions   You are still able to use cellular telephones; use the ear opposite the side where you have your pacemaker/defibrillator.  Avoid carrying your cellular phone near your device.   When traveling through airports, show security personnel your identification card to avoid being screened in the metal detectors.  Ask the security personnel to use the hand wand.   Avoid arc welding equipment, MRI testing (magnetic resonance imaging), TENS units (transcutaneous nerve stimulators).  Call the office for questions about other devices.   Avoid electrical appliances that are in poor condition or are not properly grounded.   Microwave ovens are safe to be near or to operate.  Additional information for defibrillator patients should your device go off:   If your device goes off ONCE and you feel fine afterward, notify the device clinic nurses.   If your device goes off ONCE and you do not feel well afterward, call  911.   If your device goes off TWICE, call 911.   If your device goes off THREE times in one day, call 911.  DO NOT DRIVE YOURSELF OR A FAMILY MEMBER WITH A DEFIBRILLATOR TO THE HOSPITAL--CALL 911.

## 2013-10-19 NOTE — Progress Notes (Signed)
Pt discharged to home per MD order. Pt received and reviewed all discharge instructions and medication information including follow-up appointments, post-ICD mobility restrictions, wound site care and prescription information.  Pt verbalized understanding. Pt alert and oriented at discharge with no complaints of pain. Pt escorted to private vehicle via wheelchair by guest services volunteer. Richard Cook
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
## 2013-10-25 ENCOUNTER — Ambulatory Visit (INDEPENDENT_AMBULATORY_CARE_PROVIDER_SITE_OTHER): Payer: Self-pay | Admitting: *Deleted

## 2013-10-25 ENCOUNTER — Ambulatory Visit: Payer: Self-pay | Admitting: *Deleted

## 2013-10-25 ENCOUNTER — Encounter: Payer: Self-pay | Admitting: Internal Medicine

## 2013-10-25 DIAGNOSIS — I959 Hypotension, unspecified: Secondary | ICD-10-CM

## 2013-10-25 LAB — MDC_IDC_ENUM_SESS_TYPE_INCLINIC
Battery Remaining Longevity: 135 mo
Battery Voltage: 3.06 V
Brady Statistic RV Percent Paced: 0.03 %
Date Time Interrogation Session: 20150325111813
HIGH POWER IMPEDANCE MEASURED VALUE: 0 Ohm
HighPow Impedance: 52 Ohm
Lead Channel Setting Pacing Pulse Width: 0.4 ms
Lead Channel Setting Sensing Sensitivity: 0.3 mV
MDC IDC MSMT LEADCHNL RV IMPEDANCE VALUE: 532 Ohm
MDC IDC MSMT LEADCHNL RV PACING THRESHOLD AMPLITUDE: 0.625 V
MDC IDC MSMT LEADCHNL RV PACING THRESHOLD PULSEWIDTH: 0.4 ms
MDC IDC MSMT LEADCHNL RV SENSING INTR AMPL: 9.125 mV
MDC IDC SET LEADCHNL RV PACING AMPLITUDE: 3.5 V
MDC IDC SET ZONE DETECTION INTERVAL: 240 ms
Zone Setting Detection Interval: 300 ms
Zone Setting Detection Interval: 350 ms

## 2013-10-25 LAB — BASIC METABOLIC PANEL
BUN: 23 mg/dL (ref 6–23)
CO2: 26 mEq/L (ref 19–32)
Calcium: 9.2 mg/dL (ref 8.4–10.5)
Chloride: 110 mEq/L (ref 96–112)
Creatinine, Ser: 2 mg/dL — ABNORMAL HIGH (ref 0.4–1.5)
GFR: 37.86 mL/min — AB (ref >60.00)
Glucose, Bld: 82 mg/dL (ref 70–99)
POTASSIUM: 4.4 meq/L (ref 3.5–5.1)
Sodium: 142 mEq/L (ref 135–145)

## 2013-10-25 MED ORDER — COLCHICINE 0.6 MG PO TABS
0.6000 mg | ORAL_TABLET | ORAL | Status: DC
Start: 2013-10-25 — End: 2013-10-26

## 2013-10-25 NOTE — Progress Notes (Signed)
Wound check appointment. Steri-strips removed. Wound without redness or edema. Incision edges approximated, wound well healed.   Left elbow is red and edematous, ghout per Dr. Lovena Le.  Normal device function. Thresholds, sensing, and impedances consistent with implant measurements. Device programmed at 3.5V for extra safety margin until 3 month visit. Histogram distribution appropriate for patient and level of activity. No  ventricular arrhythmias noted. Patient educated about wound care, arm mobility, lifting restrictions, shock plan. ROV in 3 months with implanting physician.

## 2013-10-26 ENCOUNTER — Other Ambulatory Visit: Payer: Self-pay

## 2013-10-26 ENCOUNTER — Ambulatory Visit: Payer: Self-pay

## 2013-10-26 MED ORDER — COLCHICINE 0.6 MG PO TABS: ORAL_TABLET | ORAL | Status: DC

## 2013-10-26 MED ORDER — COLCHICINE 0.6 MG PO TABS: 0.6000 mg | ORAL_TABLET | ORAL | Status: DC

## 2013-11-26 ENCOUNTER — Other Ambulatory Visit: Payer: Self-pay

## 2013-11-26 LAB — WBCS, STOOL

## 2013-11-26 LAB — CLOSTRIDIUM DIFFICILE(ARMC)

## 2013-11-28 LAB — STOOL CULTURE

## 2013-12-08 ENCOUNTER — Encounter: Payer: Self-pay | Admitting: Internal Medicine

## 2013-12-28 ENCOUNTER — Other Ambulatory Visit: Payer: Self-pay

## 2013-12-28 MED ORDER — CARVEDILOL 6.25 MG PO TABS: 6.2500 mg | ORAL_TABLET | Freq: Two times a day (BID) | ORAL | Status: DC

## 2014-01-15 ENCOUNTER — Telehealth: Payer: Self-pay | Admitting: Internal Medicine

## 2014-01-15 NOTE — Telephone Encounter (Signed)
Walk In pt Form " [employer]" Sent to [facility]   6.15.15/km

## 2014-01-22 ENCOUNTER — Encounter: Payer: Self-pay | Admitting: Internal Medicine

## 2014-01-22 ENCOUNTER — Ambulatory Visit (INDEPENDENT_AMBULATORY_CARE_PROVIDER_SITE_OTHER): Payer: Self-pay | Admitting: Internal Medicine

## 2014-01-22 VITALS — BP 135/81 | HR 71 | Ht 70.0 in | Wt 208.0 lb

## 2014-01-22 DIAGNOSIS — Z95810 Presence of automatic (implantable) cardiac defibrillator: Secondary | ICD-10-CM

## 2014-01-22 DIAGNOSIS — I498 Other specified cardiac arrhythmias: Secondary | ICD-10-CM

## 2014-01-22 DIAGNOSIS — I4729 Other ventricular tachycardia: Secondary | ICD-10-CM

## 2014-01-22 DIAGNOSIS — I472 Ventricular tachycardia, unspecified: Secondary | ICD-10-CM

## 2014-01-22 DIAGNOSIS — R001 Bradycardia, unspecified: Secondary | ICD-10-CM

## 2014-01-22 LAB — MDC_IDC_ENUM_SESS_TYPE_INCLINIC
Battery Remaining Longevity: 133 mo
HIGH POWER IMPEDANCE MEASURED VALUE: 0 Ohm
HIGH POWER IMPEDANCE MEASURED VALUE: 66 Ohm
Lead Channel Impedance Value: 589 Ohm
Lead Channel Pacing Threshold Amplitude: 0.625 V
Lead Channel Sensing Intrinsic Amplitude: 8.625 mV
Lead Channel Setting Pacing Amplitude: 2.5 V
Lead Channel Setting Pacing Pulse Width: 0.4 ms
Lead Channel Setting Sensing Sensitivity: 0.3 mV
MDC IDC MSMT BATTERY VOLTAGE: 3.06 V
MDC IDC MSMT LEADCHNL RV PACING THRESHOLD PULSEWIDTH: 0.4 ms
MDC IDC MSMT LEADCHNL RV SENSING INTR AMPL: 10.375 mV
MDC IDC SESS DTM: 20150622110618
MDC IDC SET ZONE DETECTION INTERVAL: 350 ms
MDC IDC STAT BRADY RV PERCENT PACED: 0.14 %
Zone Setting Detection Interval: 240 ms
Zone Setting Detection Interval: 300 ms

## 2014-01-22 MED ORDER — LISINOPRIL-HYDROCHLOROTHIAZIDE 10-12.5 MG PO TABS: 1.0000 | ORAL_TABLET | Freq: Every day | ORAL | Status: DC
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
## 2014-01-22 NOTE — Patient Instructions (Addendum)
Your physician has recommended you make the following change in your medication:  1) STOP Lisinopril 2) START Prinzide 10/12.5 mg daily  Your physician recommends that you return for lab work in: 3 weeks for BMET  Your physician wants you to follow-up in: 4 months with Dr. Martin[place]. You will receive a reminder letter in the mail two months in advance. If you don't receive a letter, please call our office to schedule the follow-up appointment.  Remote monitoring is used to monitor your Pacemaker of ICD from home. This monitoring reduces the number of office visits required to check your device to one time per year. It allows [place] to keep an eye on the functioning of your device to ensure it is working properly. You are scheduled for a device check from home on 04/23/14. You may send your transmission at any time that day. If you have a wireless device, the transmission will be sent automatically. After your physician reviews your transmission, you will receive a postcard with your next transmission date.  Your physician wants you to follow-up in: 1 year with Dr. [name].  You will receive a reminder letter in the mail two months in advance. If you don't receive a letter, please call our office to schedule the follow-up appointment.

## 2014-01-23 ENCOUNTER — Encounter: Payer: Self-pay | Admitting: Internal Medicine

## 2014-02-27 ENCOUNTER — Inpatient Hospital Stay (HOSPITAL_COMMUNITY)
Admission: EM | Admit: 2014-02-27 | Discharge: 2014-03-02 | DRG: 552 | Disposition: A | Discharge status: Home / Self Care | Payer: Self-pay | Attending: Internal Medicine | Admitting: Internal Medicine

## 2014-02-27 ENCOUNTER — Encounter (HOSPITAL_COMMUNITY): Payer: Self-pay | Admitting: Emergency Medicine

## 2014-02-27 DIAGNOSIS — L03119 Cellulitis of unspecified part of limb: Secondary | ICD-10-CM

## 2014-02-27 DIAGNOSIS — M545 Low back pain, unspecified: Secondary | ICD-10-CM

## 2014-02-27 DIAGNOSIS — E872 Acidosis, unspecified: Secondary | ICD-10-CM | POA: Diagnosis present

## 2014-02-27 DIAGNOSIS — E7800 Pure hypercholesterolemia, unspecified: Secondary | ICD-10-CM

## 2014-02-27 DIAGNOSIS — I255 Ischemic cardiomyopathy: Secondary | ICD-10-CM

## 2014-02-27 DIAGNOSIS — L02419 Cutaneous abscess of limb, unspecified: Secondary | ICD-10-CM

## 2014-02-27 DIAGNOSIS — M549 Dorsalgia, unspecified: Principal | ICD-10-CM

## 2014-02-27 DIAGNOSIS — I2589 Other forms of chronic ischemic heart disease: Secondary | ICD-10-CM | POA: Diagnosis present

## 2014-02-27 DIAGNOSIS — M5489 Other dorsalgia: Secondary | ICD-10-CM

## 2014-02-27 DIAGNOSIS — N189 Chronic kidney disease, unspecified: Secondary | ICD-10-CM

## 2014-02-27 DIAGNOSIS — I498 Other specified cardiac arrhythmias: Secondary | ICD-10-CM | POA: Diagnosis present

## 2014-02-27 DIAGNOSIS — I471 Supraventricular tachycardia, unspecified: Secondary | ICD-10-CM

## 2014-02-27 DIAGNOSIS — N289 Disorder of kidney and ureter, unspecified: Secondary | ICD-10-CM

## 2014-02-27 DIAGNOSIS — N183 Chronic kidney disease, stage 3 unspecified: Secondary | ICD-10-CM | POA: Diagnosis present

## 2014-02-27 DIAGNOSIS — N2889 Other specified disorders of kidney and ureter: Secondary | ICD-10-CM

## 2014-02-27 DIAGNOSIS — E860 Dehydration: Secondary | ICD-10-CM | POA: Diagnosis present

## 2014-02-27 DIAGNOSIS — Z86718 Personal history of other venous thrombosis and embolism: Secondary | ICD-10-CM

## 2014-02-27 DIAGNOSIS — I959 Hypotension, unspecified: Secondary | ICD-10-CM

## 2014-02-27 DIAGNOSIS — I2119 ST elevation (STEMI) myocardial infarction involving other coronary artery of inferior wall: Secondary | ICD-10-CM

## 2014-02-27 DIAGNOSIS — I129 Hypertensive chronic kidney disease with stage 1 through stage 4 chronic kidney disease, or unspecified chronic kidney disease: Secondary | ICD-10-CM | POA: Diagnosis present

## 2014-02-27 DIAGNOSIS — R001 Bradycardia, unspecified: Secondary | ICD-10-CM

## 2014-02-27 DIAGNOSIS — I472 Ventricular tachycardia, unspecified: Secondary | ICD-10-CM

## 2014-02-27 DIAGNOSIS — E875 Hyperkalemia: Secondary | ICD-10-CM

## 2014-02-27 DIAGNOSIS — I9589 Other hypotension: Secondary | ICD-10-CM

## 2014-02-27 DIAGNOSIS — I2510 Atherosclerotic heart disease of native coronary artery without angina pectoris: Secondary | ICD-10-CM

## 2014-02-27 DIAGNOSIS — R0902 Hypoxemia: Secondary | ICD-10-CM

## 2014-02-27 DIAGNOSIS — G8929 Other chronic pain: Secondary | ICD-10-CM | POA: Diagnosis present

## 2014-02-27 DIAGNOSIS — N179 Acute kidney failure, unspecified: Secondary | ICD-10-CM

## 2014-02-27 DIAGNOSIS — D649 Anemia, unspecified: Secondary | ICD-10-CM | POA: Diagnosis present

## 2014-02-27 LAB — URINALYSIS, ROUTINE W REFLEX MICROSCOPIC
BILIRUBIN URINE: NEGATIVE
Glucose, UA: NEGATIVE mg/dL
HGB URINE DIPSTICK: NEGATIVE
Ketones, ur: NEGATIVE mg/dL
NITRITE: NEGATIVE
PROTEIN: NEGATIVE mg/dL
SPECIFIC GRAVITY, URINE: 1.018 (ref 1.005–1.030)
UROBILINOGEN UA: 0.2 mg/dL (ref 0.0–1.0)
pH: 5 (ref 5.0–8.0)

## 2014-02-27 LAB — URINE MICROSCOPIC-ADD ON

## 2014-02-27 NOTE — ED Notes (Signed)
Pt reports having lower back pain x 1 month that radiates up to his mid back. Having some difficulty urinating. No acute distress noted at triage.

## 2014-02-27 NOTE — ED Notes (Signed)
PT refusing percocet; states takes at home and doesn't help.

## 2014-02-28 ENCOUNTER — Inpatient Hospital Stay (HOSPITAL_COMMUNITY): Payer: Self-pay

## 2014-02-28 ENCOUNTER — Inpatient Hospital Stay (HOSPITAL_COMMUNITY): Payer: MEDICAID

## 2014-02-28 ENCOUNTER — Emergency Department (HOSPITAL_COMMUNITY): Payer: Self-pay

## 2014-02-28 ENCOUNTER — Encounter (HOSPITAL_COMMUNITY): Payer: Self-pay | Admitting: Emergency Medicine

## 2014-02-28 DIAGNOSIS — G8929 Other chronic pain: Secondary | ICD-10-CM | POA: Diagnosis present

## 2014-02-28 DIAGNOSIS — M549 Dorsalgia, unspecified: Secondary | ICD-10-CM

## 2014-02-28 DIAGNOSIS — N183 Chronic kidney disease, stage 3 unspecified: Secondary | ICD-10-CM

## 2014-02-28 DIAGNOSIS — I9589 Other hypotension: Secondary | ICD-10-CM

## 2014-02-28 DIAGNOSIS — N179 Acute kidney failure, unspecified: Secondary | ICD-10-CM

## 2014-02-28 DIAGNOSIS — E875 Hyperkalemia: Secondary | ICD-10-CM

## 2014-02-28 DIAGNOSIS — R109 Unspecified abdominal pain: Secondary | ICD-10-CM | POA: Insufficient documentation

## 2014-02-28 DIAGNOSIS — M5489 Other dorsalgia: Secondary | ICD-10-CM

## 2014-02-28 DIAGNOSIS — I255 Ischemic cardiomyopathy: Secondary | ICD-10-CM | POA: Diagnosis present

## 2014-02-28 DIAGNOSIS — M545 Low back pain, unspecified: Secondary | ICD-10-CM

## 2014-02-28 DIAGNOSIS — R001 Bradycardia, unspecified: Secondary | ICD-10-CM | POA: Diagnosis present

## 2014-02-28 HISTORY — DX: Other dorsalgia: M54.89

## 2014-02-28 HISTORY — DX: Dorsalgia, unspecified: M54.9

## 2014-02-28 LAB — MRSA PCR SCREENING: MRSA BY PCR: NEGATIVE

## 2014-02-28 LAB — CBC WITH DIFFERENTIAL/PLATELET
BASOS ABS: 0 10*3/uL (ref 0.0–0.1)
Basophils Relative: 0 % (ref 0–1)
EOS PCT: 4 % (ref 0–5)
Eosinophils Absolute: 0.2 10*3/uL (ref 0.0–0.7)
HEMATOCRIT: 40 % (ref 39.0–52.0)
Hemoglobin: 13 g/dL (ref 13.0–17.0)
Lymphocytes Relative: 34 % (ref 12–46)
Lymphs Abs: 2 10*3/uL (ref 0.7–4.0)
MCH: 31.9 pg (ref 26.0–34.0)
MCHC: 32.5 g/dL (ref 30.0–36.0)
MCV: 98 fL (ref 78.0–100.0)
Monocytes Absolute: 0.6 10*3/uL (ref 0.1–1.0)
Monocytes Relative: 11 % (ref 3–12)
NEUTROS ABS: 3 10*3/uL (ref 1.7–7.7)
Neutrophils Relative %: 51 % (ref 43–77)
Platelets: 122 10*3/uL — ABNORMAL LOW (ref 150–400)
RBC: 4.08 MIL/uL — ABNORMAL LOW (ref 4.22–5.81)
RDW: 13.7 % (ref 11.5–15.5)
WBC: 5.8 10*3/uL (ref 4.0–10.5)

## 2014-02-28 LAB — HEPATIC FUNCTION PANEL
ALK PHOS: 104 U/L (ref 39–117)
ALT: 49 U/L (ref 0–53)
AST: 59 U/L — AB (ref 0–37)
Albumin: 3.8 g/dL (ref 3.5–5.2)
Total Bilirubin: 1 mg/dL (ref 0.3–1.2)
Total Protein: 6.6 g/dL (ref 6.0–8.3)

## 2014-02-28 LAB — I-STAT CHEM 8, ED
BUN: 78 mg/dL — ABNORMAL HIGH (ref 6–23)
CALCIUM ION: 1.2 mmol/L (ref 1.12–1.23)
CHLORIDE: 110 meq/L (ref 96–112)
Creatinine, Ser: 4.7 mg/dL — ABNORMAL HIGH (ref 0.50–1.35)
Glucose, Bld: 72 mg/dL (ref 70–99)
HEMATOCRIT: 44 % (ref 39.0–52.0)
Hemoglobin: 15 g/dL (ref 13.0–17.0)
Potassium: 6.4 mEq/L — ABNORMAL HIGH (ref 3.7–5.3)
Sodium: 136 mEq/L — ABNORMAL LOW (ref 137–147)
TCO2: 20 mmol/L (ref 0–100)

## 2014-02-28 LAB — BASIC METABOLIC PANEL
Anion gap: 16 — ABNORMAL HIGH (ref 5–15)
BUN: 71 mg/dL — AB (ref 6–23)
CHLORIDE: 106 meq/L (ref 96–112)
CO2: 20 mEq/L (ref 19–32)
CREATININE: 4.1 mg/dL — AB (ref 0.50–1.35)
Calcium: 9 mg/dL (ref 8.4–10.5)
GFR calc Af Amer: 17 mL/min — ABNORMAL LOW (ref >90)
GFR calc non Af Amer: 15 mL/min — ABNORMAL LOW (ref >90)
GLUCOSE: 78 mg/dL (ref 70–99)
Potassium: 5.7 mEq/L — ABNORMAL HIGH (ref 3.7–5.3)
Sodium: 142 mEq/L (ref 137–147)

## 2014-02-28 LAB — I-STAT TROPONIN, ED: Troponin i, poc: 0.01 ng/mL (ref 0.00–0.08)

## 2014-02-28 LAB — CBC
HCT: 39 % (ref 39.0–52.0)
HEMOGLOBIN: 12.8 g/dL — AB (ref 13.0–17.0)
MCH: 32 pg (ref 26.0–34.0)
MCHC: 32.8 g/dL (ref 30.0–36.0)
MCV: 97.5 fL (ref 78.0–100.0)
Platelets: DECREASED 10*3/uL (ref 150–400)
RBC: 4 MIL/uL — AB (ref 4.22–5.81)
RDW: 13.2 % (ref 11.5–15.5)
WBC: 4 10*3/uL (ref 4.0–10.5)

## 2014-02-28 LAB — CREATININE, SERUM
CREATININE: 2.86 mg/dL — AB (ref 0.50–1.35)
GFR calc Af Amer: 27 mL/min — ABNORMAL LOW (ref >90)
GFR calc non Af Amer: 23 mL/min — ABNORMAL LOW (ref >90)

## 2014-02-28 LAB — CBG MONITORING, ED: Glucose-Capillary: 133 mg/dL — ABNORMAL HIGH (ref 70–99)

## 2014-02-28 LAB — TROPONIN I: Troponin I: <0.3 ng/mL (ref <0.30)

## 2014-02-28 LAB — TSH: TSH: 1.85 u[IU]/mL (ref 0.350–4.500)

## 2014-02-28 MED ORDER — GLUCAGON HCL RDNA (DIAGNOSTIC) 1 MG IJ SOLR
1.0000 mg | Freq: Once | INTRAMUSCULAR | Status: AC
  Administered 2014-02-28: 1 mg via INTRAVENOUS
  Filled 2014-02-28: qty 1

## 2014-02-28 MED ORDER — SODIUM CHLORIDE 0.9 % IV BOLUS (SEPSIS)
500.0000 mL | Freq: Once | INTRAVENOUS | Status: AC
  Administered 2014-02-28: 500 mL via INTRAVENOUS

## 2014-02-28 MED ORDER — DEXAMETHASONE SODIUM PHOSPHATE 4 MG/ML IJ SOLN
10.0000 mg | Freq: Once | INTRAMUSCULAR | Status: AC
  Administered 2014-02-28: 10 mg via INTRAVENOUS
  Filled 2014-02-28: qty 3

## 2014-02-28 MED ORDER — INSULIN ASPART 100 UNIT/ML IV SOLN
10.0000 [IU] | Freq: Once | INTRAVENOUS | Status: AC
Start: 2014-02-28 — End: 2014-02-28
  Administered 2014-02-28: 10 [IU] via INTRAVENOUS

## 2014-02-28 MED ORDER — ONDANSETRON HCL 4 MG/2ML IJ SOLN
4.0000 mg | Freq: Four times a day (QID) | INTRAMUSCULAR | Status: DC | PRN
  Administered 2014-02-28 – 2014-03-01 (×2): 4 mg via INTRAVENOUS
  Filled 2014-02-28 (×2): qty 2

## 2014-02-28 MED ORDER — ACETAMINOPHEN 325 MG PO TABS: 650.0000 mg | ORAL_TABLET | Freq: Four times a day (QID) | ORAL | Status: DC | PRN

## 2014-02-28 MED ORDER — ATORVASTATIN CALCIUM 40 MG PO TABS
40.0000 mg | ORAL_TABLET | Freq: Every day | ORAL | Status: DC
  Administered 2014-02-28 – 2014-03-01 (×2): 40 mg via ORAL
  Filled 2014-02-28 (×3): qty 1

## 2014-02-28 MED ORDER — ASPIRIN EC 81 MG PO TBEC
81.0000 mg | DELAYED_RELEASE_TABLET | Freq: Every day | ORAL | Status: DC
  Administered 2014-02-28 – 2014-03-02 (×3): 81 mg via ORAL
  Filled 2014-02-28 (×3): qty 1

## 2014-02-28 MED ORDER — ACETAMINOPHEN 650 MG RE SUPP: 650.0000 mg | Freq: Four times a day (QID) | RECTAL | Status: DC | PRN

## 2014-02-28 MED ORDER — PANTOPRAZOLE SODIUM 40 MG PO TBEC
40.0000 mg | DELAYED_RELEASE_TABLET | Freq: Every day | ORAL | Status: DC
  Administered 2014-02-28 – 2014-03-02 (×3): 40 mg via ORAL
  Filled 2014-02-28 (×3): qty 1

## 2014-02-28 MED ORDER — ALBUTEROL SULFATE (2.5 MG/3ML) 0.083% IN NEBU
2.5000 mg | INHALATION_SOLUTION | Freq: Once | RESPIRATORY_TRACT | Status: AC
  Administered 2014-02-28: 2.5 mg via RESPIRATORY_TRACT
  Filled 2014-02-28: qty 3

## 2014-02-28 MED ORDER — ALUM & MAG HYDROXIDE-SIMETH 200-200-20 MG/5ML PO SUSP
30.0000 mL | Freq: Four times a day (QID) | ORAL | Status: DC | PRN
  Administered 2014-03-01: 30 mL via ORAL

## 2014-02-28 MED ORDER — DOCUSATE SODIUM 100 MG PO CAPS
100.0000 mg | ORAL_CAPSULE | Freq: Two times a day (BID) | ORAL | Status: DC
  Administered 2014-03-01 (×2): 100 mg via ORAL
  Filled 2014-02-28 (×3): qty 1

## 2014-02-28 MED ORDER — MORPHINE SULFATE 2 MG/ML IJ SOLN
2.0000 mg | INTRAMUSCULAR | Status: DC | PRN
  Administered 2014-03-02: 2 mg via INTRAVENOUS
  Filled 2014-02-28: qty 1

## 2014-02-28 MED ORDER — DEXTROSE 50 % IV SOLN
50.0000 mL | Freq: Once | INTRAVENOUS | Status: AC
  Administered 2014-02-28: 50 mL via INTRAVENOUS
  Filled 2014-02-28: qty 50

## 2014-02-28 MED ORDER — SODIUM POLYSTYRENE SULFONATE 15 GM/60ML PO SUSP
30.0000 g | Freq: Once | ORAL | Status: AC
  Administered 2014-02-28: 30 g via ORAL
  Filled 2014-02-28: qty 120

## 2014-02-28 MED ORDER — SODIUM CHLORIDE 0.9 % IV BOLUS (SEPSIS)
1000.0000 mL | Freq: Once | INTRAVENOUS | Status: AC
  Administered 2014-02-28: 1000 mL via INTRAVENOUS

## 2014-02-28 MED ORDER — HEPARIN SODIUM (PORCINE) 5000 UNIT/ML IJ SOLN
5000.0000 [IU] | Freq: Three times a day (TID) | INTRAMUSCULAR | Status: DC
Start: 2014-02-28 — End: 2014-03-02
  Administered 2014-02-28 – 2014-03-02 (×6): 5000 [IU] via SUBCUTANEOUS
  Filled 2014-02-28 (×9): qty 1

## 2014-02-28 MED ORDER — SODIUM CHLORIDE 0.9 % IV SOLN
INTRAVENOUS | Status: DC
  Administered 2014-02-28 – 2014-03-01 (×3): via INTRAVENOUS

## 2014-02-28 MED ORDER — ONDANSETRON HCL 4 MG PO TABS: 4.0000 mg | ORAL_TABLET | Freq: Four times a day (QID) | ORAL | Status: DC | PRN

## 2014-02-28 MED ORDER — DEXAMETHASONE SODIUM PHOSPHATE 10 MG/ML IJ SOLN
10.0000 mg | Freq: Once | INTRAMUSCULAR | Status: DC
  Filled 2014-02-28: qty 1

## 2014-02-28 MED ORDER — OXYCODONE HCL 5 MG PO TABS
5.0000 mg | ORAL_TABLET | ORAL | Status: DC | PRN
  Administered 2014-03-01: 5 mg via ORAL
  Filled 2014-02-28: qty 1

## 2014-02-28 MED ORDER — SODIUM CHLORIDE 0.9 % IJ SOLN
3.0000 mL | Freq: Two times a day (BID) | INTRAMUSCULAR | Status: DC
  Administered 2014-02-28 – 2014-03-01 (×4): 3 mL via INTRAVENOUS
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
## 2014-02-28 NOTE — ED Notes (Signed)
Dr. Scherrie Merritts  at bedside.

## 2014-02-28 NOTE — Progress Notes (Signed)
Pt was brought over for an MRI, upon screening the patient he informed us that he had an AICD placed when he had CABG.  Investigating, the device is not MRI compatible and pt was sent back to ER, nursing station in [place] was called and relayed a message for the nurse.

## 2014-02-28 NOTE — Progress Notes (Signed)
[facility] Community Liaison at [place]:  Will be sending patient information on [facility] [employer] and list of primary care resources upon discharge, to help patient establish primary care.

## 2014-02-28 NOTE — Progress Notes (Signed)
Asked to evaluate for pericardiocardial effusion with U/S  Reasonable quality images obtained in Apical 4, PSLAX and short axis images showed no evidence pericardial effusion on my bedside evaluation. Results  Conveyed to the ER physician.   Cardiology  [place] ,[place].D

## 2014-02-28 NOTE — ED Notes (Signed)
IV team paged and returned page, will insert second IV line.

## 2014-02-28 NOTE — ED Notes (Signed)
Attempted report 

## 2014-03-01 DIAGNOSIS — I959 Hypotension, unspecified: Secondary | ICD-10-CM

## 2014-03-01 DIAGNOSIS — M549 Dorsalgia, unspecified: Principal | ICD-10-CM

## 2014-03-01 LAB — BASIC METABOLIC PANEL
ANION GAP: 12 (ref 5–15)
Anion gap: 13 (ref 5–15)
BUN: 62 mg/dL — ABNORMAL HIGH (ref 6–23)
BUN: 44 mg/dL — AB (ref 6–23)
CALCIUM: 7.3 mg/dL — AB (ref 8.4–10.5)
CALCIUM: 8.6 mg/dL (ref 8.4–10.5)
CO2: 16 mEq/L — ABNORMAL LOW (ref 19–32)
CO2: 17 mEq/L — ABNORMAL LOW (ref 19–32)
CREATININE: 1.89 mg/dL — AB (ref 0.50–1.35)
Chloride: 113 mEq/L — ABNORMAL HIGH (ref 96–112)
Chloride: 116 mEq/L — ABNORMAL HIGH (ref 96–112)
Creatinine, Ser: 2.45 mg/dL — ABNORMAL HIGH (ref 0.50–1.35)
GFR calc Af Amer: 32 mL/min — ABNORMAL LOW (ref >90)
GFR calc Af Amer: 44 mL/min — ABNORMAL LOW (ref >90)
GFR calc non Af Amer: 28 mL/min — ABNORMAL LOW (ref >90)
GFR calc non Af Amer: 38 mL/min — ABNORMAL LOW (ref >90)
Glucose, Bld: 147 mg/dL — ABNORMAL HIGH (ref 70–99)
Glucose, Bld: 104 mg/dL — ABNORMAL HIGH (ref 70–99)
Potassium: 5.7 mEq/L — ABNORMAL HIGH (ref 3.7–5.3)
Potassium: 4.1 mEq/L (ref 3.7–5.3)
Sodium: 141 mEq/L (ref 137–147)
Sodium: 146 mEq/L (ref 137–147)

## 2014-03-01 LAB — CBC
HEMATOCRIT: 38.2 % — AB (ref 39.0–52.0)
Hemoglobin: 12.5 g/dL — ABNORMAL LOW (ref 13.0–17.0)
MCH: 31.6 pg (ref 26.0–34.0)
MCHC: 32.7 g/dL (ref 30.0–36.0)
MCV: 96.7 fL (ref 78.0–100.0)
Platelets: 134 10*3/uL — ABNORMAL LOW (ref 150–400)
RBC: 3.95 MIL/uL — ABNORMAL LOW (ref 4.22–5.81)
RDW: 13.1 % (ref 11.5–15.5)
WBC: 9 10*3/uL (ref 4.0–10.5)

## 2014-03-01 LAB — TROPONIN I: Troponin I: <0.3 ng/mL (ref <0.30)

## 2014-03-01 MED ORDER — SODIUM POLYSTYRENE SULFONATE 15 GM/60ML PO SUSP
30.0000 g | Freq: Once | ORAL | Status: AC
  Administered 2014-03-01: 30 g via ORAL
  Filled 2014-03-01: qty 120

## 2014-03-01 MED ORDER — ENSURE COMPLETE PO LIQD
237.0000 mL | Freq: Every day | ORAL | Status: DC
  Administered 2014-03-01: 237 mL via ORAL

## 2014-03-01 MED ORDER — LOPERAMIDE HCL 2 MG PO CAPS
2.0000 mg | ORAL_CAPSULE | Freq: Once | ORAL | Status: AC
  Administered 2014-03-01: 2 mg via ORAL
  Filled 2014-03-01: qty 1

## 2014-03-01 MED ORDER — ALUM & MAG HYDROXIDE-SIMETH 200-200-20 MG/5ML PO SUSP
15.0000 mL | ORAL | Status: DC | PRN
  Filled 2014-03-01: qty 30

## 2014-03-01 NOTE — Progress Notes (Signed)
Utilization review completed.  
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
## 2014-03-01 NOTE — Progress Notes (Signed)
Patient stated that he had three loose stools after receiving colace at 2114.  He also felt nauseated.  Patient was given IV Zofran 4mg  at 2239 and some sprite.  Patient called out later and said he was still nauseated.  Notifed MD on call.  Patient was given a one time dose of 2mg  Immodium po and 105ml Maalox po @2354 . Will continue to monitor.

## 2014-03-01 NOTE — Progress Notes (Signed)
Rn requested brady alarm parameter lowered to 40bpm.  Pt is sleeping comfortably, arouseable, appropriate and without complaints.  Will con't to monitor.

## 2014-03-01 NOTE — Progress Notes (Signed)
Report called pt transferring via w/c with belongings to [phone]

## 2014-03-01 NOTE — Progress Notes (Signed)
[facility] PROGRESS NOTE  Richard Cook [id number] DOB: August 27, 1955 DOA: 02/27/2014 PCP: Carlyn Reichert, MD Interim summary: Richard Cook is a 58 y.o. male with prior h/o of CAD, ischemic cardiomyopathy, with LVEF of 35% s/o AICD, not MRI compatible, h/o DVT in the past, OHS, DVT, stage 3 CKD, comes in for persistent back pain. On arrival to ED, he was found to have acute on chronic renal failure, hypotensive, bradycardic and hyperkalemic. He was admitted to [facility] for further evaluation. There was a question of pericardial effusion, bed side echocardiogram did not reveal any pericardial effusion.   Assessment/Plan: 1. Acute on CKD stage 3 with mild metabolic acidosis: - possibly related to volume depletion secondary to dehydration. His baseline creatinine is between 2 to 2.3 and his creatinine on admission is 4.7 , after adequate hydration, it has improved to 2.4. He is on 59ml of NS fluids, will continue the fluids for 12 more hours and monitor for signs of fluid overload. Repeat BMP to evaluate for resolution of acidosis in am.    2. Hyperkalemia: - secondary to dehydration. Kayexalate ordered and repeat K will be ordered tonight.  - no peaked T waves on EKG done earlier.  - continue to monitor.    3. Ischemic cardiomyopathy with LVEF OF 30 TO 35% s/p ICD placement; - currently his lisinopril and coreg on hold secondary to hypotension and bradycardia. TSH within normal limits.  - will resume them once hypotension and bradycardia resolves. Currently denies any chest pain or sob or pedal edema. He appears to be compensated. Cycled enzymes are negative.  - continue with aspirin.   4. Back Pain: - much improved. Pain control. Could not undergo MRI of the lumbar spine secondary to AICD.   5. Bradycardia:  Rate is between 40's to 50's. TSH within normal limits. Coreg on hold. Continue to monitor.    6. Anemia: - mild, appears to be at baseline. Continue  to monitor.   7. DVT prophylaxis. Sq heparin.   Code Status: full code Family Communication: none at bedside Disposition Plan: possible transfer to telemetry to day.    Consultants:  Cardiology.   Procedures:  Bedside Echocardiogram.   Antibiotics:  none  HPI/Subjective: Grumpy, no new complaints. Wants to know when he can go home.   Objective: Filed Vitals:   03/01/14 0715  BP: 108/61  Pulse: 43  Temp: 97.4 F (36.3 C)  Resp: 13    Intake/Output Summary (Last 24 hours) at 03/01/14 0729 Last data filed at 03/01/14 0700  Gross per 24 hour  Intake 1936.75 ml  Output   1300 ml  Net 636.75 ml   Filed Weights   02/27/14 1813 02/28/14 1500  Weight: 83.915 kg (185 lb) 90.7 kg (199 lb 15.3 oz)    Exam:   General:  Alert , lying in bed comfortable. Denies any new complaints.   Cardiovascular: s1s2. Bradycardic, no m/r/g. No  pedal edema  Respiratory: CTAB, no wheezing or rhonchi.   Abdomen: soft NT ND BS+  Musculoskeletal: normal tone BUE/BLE, no pedal edema.   Neuro: alert and oriented to place, person and time. Speech normal. No focal deficits.   Data Reviewed: Basic Metabolic Panel:  Recent Labs Lab 02/28/14 0106 02/28/14 0300 02/28/14 1235 03/01/14 0038  NA 136* 142  --  141  K 6.4* 5.7*  --  5.7*  CL 110 106  --  113*  CO2  --  20  --  16*  GLUCOSE 72 78  --  147*  BUN 78* 71*  --  62*  CREATININE 4.70* 4.10* 2.86* 2.45*  CALCIUM  --  9.0  --  8.6   Liver Function Tests:  Recent Labs Lab 02/28/14 0300  AST 59*  ALT 49  ALKPHOS 104  BILITOT 1.0  PROT 6.6  ALBUMIN 3.8   No results found for this basename: LIPASE, AMYLASE,  in the last 168 hours No results found for this basename: AMMONIA,  in the last 168 hours CBC:  Recent Labs Lab 02/28/14 0100 02/28/14 0106 02/28/14 1235 03/01/14 0038  WBC 5.8  --  4.0 9.0  NEUTROABS 3.0  --   --   --   HGB 13.0 15.0 12.8* 12.5*  HCT 40.0 44.0 39.0 38.2*  MCV 98.0  --  97.5 96.7   PLT 122*  --  PLATELET CLUMPS NOTED ON SMEAR, COUNT APPEARS DECREASED 134*   Cardiac Enzymes:  Recent Labs Lab 02/28/14 1235 02/28/14 1815 03/01/14 0038  TROPONINI <0.30 <0.30 <0.30   BNP (last 3 results)  Recent Labs  10/16/13 1630  PROBNP 3421.0*   CBG:  Recent Labs Lab 02/28/14 0540  GLUCAP 133*    Recent Results (from the past 240 hour(s))  MRSA PCR SCREENING     Status: None   Collection Time    02/28/14  3:28 PM      Result Value Ref Range Status   MRSA by PCR NEGATIVE  NEGATIVE Final   Comment:            The GeneXpert MRSA Assay (FDA     approved for NASAL specimens     only), is one component of a     comprehensive MRSA colonization     surveillance program. It is not     intended to diagnose MRSA     infection nor to guide or     monitor treatment for     MRSA infections.     Studies: Ct Abdomen Pelvis Wo Contrast  02/28/2014   CLINICAL DATA:  Back pain.  Hypertension.  EXAM: CT ABDOMEN AND PELVIS WITHOUT CONTRAST  TECHNIQUE: Multidetector CT imaging of the abdomen and pelvis was performed following the standard protocol without IV contrast.  COMPARISON:  None.  FINDINGS: Minimal atelectasis or scarring at the left lung base. AICD in place. No pericardial fluid visible.  The liver has a normal appearance without contrast. There has been previous cholecystectomy. The spleen is normal. The pancreas is normal. The adrenal glands are normal. The kidneys show atrophy, more on the right than the left. There are probably multiple small cysts. There is a dominant cyst the lower pole on the right measuring 6.8 cm in diameter. This shows fluid density. There are microcalcifications in both kidneys. No hydronephrosis. No evidence of passing stone on either side. No stone in the bladder. The aorta shows atherosclerosis but there is no aneurysm. The IVC is normal. No retroperitoneal mass or adenopathy. No free intraperitoneal fluid or air. The appendix is normal. No other  bowel finding. There are ordinary degenerative changes of the lumbar spine. Chronic widening of the symphysis pubis, posttraumatic degenerative changes of the left sacroiliac region and some heterotopic ossification in the region of the left iliac crest related to distant history of pelvic fracture.  IMPRESSION: No acute finding to explain the symptoms.  Scarring or atelectasis at the left base.  Previous cholecystectomy.  Atrophic kidneys with tiny stones and cysts. No evidence of hydronephrosis or passing stone.  Findings related to  old pelvic fractures. Chronic degenerative changes of the lumbar spine.   Electronically Signed   By: Nelson Chimes M.D.   On: 02/28/2014 08:13   Dg Chest 2 View  02/28/2014   CLINICAL DATA:  Low back pain.  Abnormal EKG.  EXAM: CHEST  2 VIEW  COMPARISON:  10/19/2013  FINDINGS: Transvenous defibrillator in place. Cardiac silhouette is normal with no suggestion of a pericardial effusion. Heart size is unchanged. Pulmonary vascularity is normal. Lungs are clear. No effusions. No acute osseous abnormality.  IMPRESSION: No acute abnormalities. Specifically, no findings suggestive of pericardial effusion.   Electronically Signed   By: Rozetta Nunnery M.D.   On: 02/28/2014 02:34    Scheduled Meds: . aspirin EC  81 mg Oral Daily  . atorvastatin  40 mg Oral q1800  . docusate sodium  100 mg Oral BID  . heparin  5,000 Units Subcutaneous 3 times per day  . pantoprazole  40 mg Oral Daily  . sodium chloride  3 mL Intravenous Q12H   Continuous Infusions: . sodium chloride 75 mL/hr at 03/01/14 0030    Principal Problem:   Back pain Active Problems:   CAD- PCI to RCA 08/10/11, CABG in [place] 5/14   Hypotension   AKI (acute kidney injury)   Hyperkalemia   Ischemic cardiomyopathy   Bradycardia    Time spent: 30 MINUTES.     [facility] Pager [phone] If 7PM-7AM, please contact night-coverage at [email or web address], password [facility] 03/01/2014, 7:29 AM  LOS: 2 days

## 2014-03-02 DIAGNOSIS — I2510 Atherosclerotic heart disease of native coronary artery without angina pectoris: Secondary | ICD-10-CM

## 2014-03-02 DIAGNOSIS — N189 Chronic kidney disease, unspecified: Secondary | ICD-10-CM

## 2014-03-02 DIAGNOSIS — N289 Disorder of kidney and ureter, unspecified: Secondary | ICD-10-CM

## 2014-03-02 DIAGNOSIS — I498 Other specified cardiac arrhythmias: Secondary | ICD-10-CM

## 2014-03-02 LAB — BASIC METABOLIC PANEL
Anion gap: 16 — ABNORMAL HIGH (ref 5–15)
BUN: 40 mg/dL — AB (ref 6–23)
CO2: 13 mEq/L — ABNORMAL LOW (ref 19–32)
Calcium: 8.6 mg/dL (ref 8.4–10.5)
Chloride: 113 mEq/L — ABNORMAL HIGH (ref 96–112)
Creatinine, Ser: 1.74 mg/dL — ABNORMAL HIGH (ref 0.50–1.35)
GFR calc Af Amer: 48 mL/min — ABNORMAL LOW (ref >90)
GFR calc non Af Amer: 42 mL/min — ABNORMAL LOW (ref >90)
GLUCOSE: 77 mg/dL (ref 70–99)
Potassium: 5.4 mEq/L — ABNORMAL HIGH (ref 3.7–5.3)
Sodium: 142 mEq/L (ref 137–147)

## 2014-03-02 MED ORDER — CARVEDILOL 3.125 MG PO TABS: 3.1250 mg | ORAL_TABLET | Freq: Two times a day (BID) | ORAL | Status: DC

## 2014-03-02 NOTE — Progress Notes (Signed)
Reviewed DC summary with patient. Patient discharge to home.

## 2014-03-04 NOTE — ED Provider Notes (Signed)
Medical screening examination/treatment/procedure(s) were conducted as a shared visit with non-physician practitioner(s) and myself.  I personally evaluated the patient during the encounter.   EKG Interpretation   Date/Time:  Wednesday February 28 2014 01:17:08 EDT Ventricular Rate:  58 PR Interval:  196 QRS Duration: 90 QT Interval:  426 QTC Calculation: 418 R Axis:   78 Text Interpretation:  Sinus rhythm Low voltage, extremity and precordial  leads Confirmed by [facility]  MD, Lekeith Wulf ([id number]) on 02/28/2014 1:46:00  AM     Seen and personally examined  Patient is here with back pain and difficulty urinating.  Is also hypoxic hypotensive and bradycardiac  I personally spoke with Dr. Halford Chessman of PCCM Dr. Aviva Signs of cardiology who saw the patient  Dr.  Hal Hope of the hospitalist  Issues: 1. Bradycardia on coreg, responds to glucagon 2. Acute renal failure 3. Hyperkalemia-- treating in the ED 4. Hypotension, IVF and glucagon  NCAT PERRL MMM Bradycardia nl s1 s2 Diminished BS B NABS abdomen is soft no guarding no Rebound 5/5 BLE strength  Medications  sodium chloride 0.9 % bolus 1,000 mL (0 mLs Intravenous Stopped 02/28/14 0216)  dexamethasone (DECADRON) injection 10 mg (10 mg Intravenous Given 02/28/14 0159)  albuterol (PROVENTIL) (2.5 MG/3ML) 0.083% nebulizer solution 2.5 mg (2.5 mg Nebulization Given 02/28/14 0415)  dextrose 50 % solution 50 mL (50 mLs Intravenous Given 02/28/14 0500)  insulin aspart (novoLOG) injection 10 Units (10 Units Intravenous Given 02/28/14 0504)  sodium polystyrene (KAYEXALATE) 15 GM/60ML suspension 30 g (30 g Oral Given 02/28/14 0458)  glucagon (human recombinant) (GLUCAGEN) injection 1 mg (1 mg Intravenous Given 02/28/14 0551)  sodium chloride 0.9 % bolus 500 mL (0 mLs Intravenous Stopped 02/28/14 0650)  glucagon (human recombinant) (GLUCAGEN) injection 1 mg (1 mg Intravenous Given 02/28/14 0907)  sodium chloride 0.9 % bolus 1,000 mL (0 mLs Intravenous  Stopped 02/28/14 0743)  sodium polystyrene (KAYEXALATE) 15 GM/60ML suspension 30 g (30 g Oral Given 03/01/14 0915)  loperamide (IMODIUM) capsule 2 mg (2 mg Oral Given 03/01/14 2353)   CRITICAL CARE Performed by: Carlisle Beers Total critical care time: 120 minutes Critical care time was exclusive of separately billable procedures and treating other patients. Critical care was necessary to treat or prevent imminent or life-threatening deterioration. Critical care was time spent personally by me on the following activities: development of treatment plan with patient and/or surrogate as well as nursing, discussions with consultants, evaluation of patient's response to treatment, examination of patient, obtaining history from patient or surrogate, ordering and performing treatments and interventions, ordering and review of laboratory studies, ordering and review of radiographic studies, pulse oximetry and re-evaluation of patient's condition.  Previous chart nurses notes previous visits reviewed, labs reviewed by me (hyperkalemia acute renal failure)  EKG reviewed by me  Lerline Valdivia K Yolandra Habig-Rasch, MD 03/04/14 [phone]

## 2014-03-05 ENCOUNTER — Other Ambulatory Visit: Payer: Self-pay

## 2014-03-13 NOTE — Discharge Summary (Signed)
Physician Discharge Summary  Richard Cook [id number] DOB: May 06, 1956 DOA: 02/27/2014  PCP: Carlyn Reichert, MD  Admit date: 02/27/2014 Discharge date: 03/02/2014  Time spent: 40 minutes  Recommendations for Outpatient Follow-up:  1. PCP in 1 week, need Bmet repeated then 2. Dr.Klein in 2 weeks  Discharge Diagnoses:  Principal Problem:   Back pain Active Problems:   CAD- PCI to RCA 08/10/11, CABG in [place] 5/14   Hypotension   AKI (acute kidney injury)   Hyperkalemia   Ischemic cardiomyopathy   Bradycardia   Discharge Condition: stable  Diet recommendation: heart healthy, low sodium   Filed Weights   02/27/14 1813 02/28/14 1500 03/02/14 0419  Weight: 83.915 kg (185 lb) 90.7 kg (199 lb 15.3 oz) 90.2 kg (198 lb 13.7 oz)    History of present illness:  Chief Complaint: Back pain  HPI: Richard Cook is a 58 y.o. male with a past medical history of coronary artery disease, ischemic cardiomyopathy, EF 30-35% with severe global hypokinesis. He presented to the emergency room with complaints of severe back pain,  his back pain was located in the midthoracic and lower lumbar region.   In the emergency room he was found to be hypotensive and bradycardic having a blood pressure of 88/52 with heart rate of 52. Lab work revealing acute on chronic renal failure with creatinine of 4.7 with BUN of 78.  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  Hospital Course:  1. Acute on CKD stage 3 with mild metabolic acidosis: -likely related to volume depletion secondary to dehydration. His baseline creatinine is between 2 to 2.3 and his creatinine on admission is 4.7 , after adequate hydration, it has improved to 1.7 at discharge which is better than his baseline. -ACE resumed at discharge  2. Hyperkalemia:  - secondary to dehydration. Improved with hydration/Kayexalate  - no peaked T  waves on EKG done earlier.  - repeat K normalized  3. Ischemic cardiomyopathy with LVEF OF 30 TO 35% s/p ICD placement;  - initially his lisinopril and coreg were held secondary to AKI and bradycardia. TSH within normal limits.  - they were both resumed once Bp, HR and renal function improved - clinically compensated. - Cycled enzymes are negative.   4. Back Pain:  - much improved. Pain control. Could not undergo MRI of the lumbar spine secondary to AICD.  -further management per PCP  5. Bradycardia:  Rate is between 40's to 50's yesterday, improved to 70s today, coreg resumed at lower dose. TSH within normal limits.   6. Anemia:  - mild, appears to be at baseline   Discharge Exam: Filed Vitals:   03/02/14 0419  BP: 119/69  Pulse: 48  Temp: 98 F (36.7 C)  Resp: 16    General: AAOx3 Cardiovascular: S1S2/RRR Respiratory: CTAB  Discharge Instructions You were cared for by a hospitalist during your hospital stay. If you have any questions about your discharge medications or the care you received while you were in the hospital after you are discharged, you can call the unit and asked to speak with the hospitalist on call if the hospitalist that took care of you is not available. Once you are discharged, your primary care physician will handle any further medical issues. Please note that NO REFILLS for any discharge medications will be authorized once you are discharged, as it is imperative that you return to your primary care  physician (or establish a relationship with a primary care physician if you do not have one) for your aftercare needs so that they can reassess your need for medications and monitor your lab values.  Discharge Instructions   Diet - low sodium heart healthy    Complete by:  As directed      Increase activity slowly    Complete by:  As directed             Medication List         allopurinol 300 MG tablet  Commonly known as:  ZYLOPRIM  Take 300 mg by  mouth daily.     aspirin EC 81 MG tablet  Take 81 mg by mouth daily.     atorvastatin 40 MG tablet  Commonly known as:  LIPITOR  Take 1 tablet (40 mg total) by mouth daily at 6 PM.     carvedilol 3.125 MG tablet  Commonly known as:  COREG  Take 1 tablet (3.125 mg total) by mouth every 12 (twelve) hours.     clonazePAM 2 MG tablet  Commonly known as:  KLONOPIN  Take 2 mg by mouth 3 (three) times daily as needed for anxiety.     HYDROcodone-acetaminophen 7.5-325 MG per tablet  Commonly known as:  NORCO  Take 1-2 tablets by mouth every 6 (six) hours as needed for pain.     lisinopril-hydrochlorothiazide 10-12.5 MG per tablet  Commonly known as:  PRINZIDE,ZESTORETIC  Take 1 tablet by mouth daily.       No Known Allergies     Follow-up Information   Follow up with Carlyn Reichert, MD. Schedule an appointment as soon as possible for a visit in 1 week.   Specialty:  Family Medicine   Contact information:   [address] [place] [address] [phone]       Follow up with Virl Axe, MD In 2 weeks.   Specialty:  Cardiology   Contact information:   [id number] [place] [address] [phone]        The results of significant diagnostics from this hospitalization (including imaging, microbiology, ancillary and laboratory) are listed below for reference.    Significant Diagnostic Studies: Ct Abdomen Pelvis Wo Contrast  02/28/2014   CLINICAL DATA:  Back pain.  Hypertension.  EXAM: CT ABDOMEN AND PELVIS WITHOUT CONTRAST  TECHNIQUE: Multidetector CT imaging of the abdomen and pelvis was performed following the standard protocol without IV contrast.  COMPARISON:  None.  FINDINGS: Minimal atelectasis or scarring at the left lung base. AICD in place. No pericardial fluid visible.  The liver has a normal appearance without contrast. There has been previous cholecystectomy. The spleen is normal. The pancreas is normal. The adrenal  glands are normal. The kidneys show atrophy, more on the right than the left. There are probably multiple small cysts. There is a dominant cyst the lower pole on the right measuring 6.8 cm in diameter. This shows fluid density. There are microcalcifications in both kidneys. No hydronephrosis. No evidence of passing stone on either side. No stone in the bladder. The aorta shows atherosclerosis but there is no aneurysm. The IVC is normal. No retroperitoneal mass or adenopathy. No free intraperitoneal fluid or air. The appendix is normal. No other bowel finding. There are ordinary degenerative changes of the lumbar spine. Chronic widening of the symphysis pubis, posttraumatic degenerative changes of the left sacroiliac region and some heterotopic ossification in the region of the left iliac  crest related to distant history of pelvic fracture.  IMPRESSION: No acute finding to explain the symptoms.  Scarring or atelectasis at the left base.  Previous cholecystectomy.  Atrophic kidneys with tiny stones and cysts. No evidence of hydronephrosis or passing stone.  Findings related to old pelvic fractures. Chronic degenerative changes of the lumbar spine.   Electronically Signed   By: Nelson Chimes M.D.   On: 02/28/2014 08:13   Dg Chest 2 View  02/28/2014   CLINICAL DATA:  Low back pain.  Abnormal EKG.  EXAM: CHEST  2 VIEW  COMPARISON:  10/19/2013  FINDINGS: Transvenous defibrillator in place. Cardiac silhouette is normal with no suggestion of a pericardial effusion. Heart size is unchanged. Pulmonary vascularity is normal. Lungs are clear. No effusions. No acute osseous abnormality.  IMPRESSION: No acute abnormalities. Specifically, no findings suggestive of pericardial effusion.   Electronically Signed   By: Rozetta Nunnery M.D.   On: 02/28/2014 02:34    Microbiology: No results found for this or any previous visit (from the past 240 hour(s)).   Labs: Basic Metabolic Panel: No results found for this basename: NA, K,  CL, CO2, GLUCOSE, BUN, CREATININE, CALCIUM, MG, PHOS,  in the last 168 hours Liver Function Tests: No results found for this basename: AST, ALT, ALKPHOS, BILITOT, PROT, ALBUMIN,  in the last 168 hours No results found for this basename: LIPASE, AMYLASE,  in the last 168 hours No results found for this basename: AMMONIA,  in the last 168 hours CBC: No results found for this basename: WBC, NEUTROABS, HGB, HCT, MCV, PLT,  in the last 168 hours Cardiac Enzymes: No results found for this basename: CKTOTAL, CKMB, CKMBINDEX, TROPONINI,  in the last 168 hours BNP: BNP (last 3 results)  Recent Labs  10/16/13 1630  PROBNP 3421.0*   CBG: No results found for this basename: GLUCAP,  in the last 168 hours     Signed:  Tanyia Grabbe  [facility] 03/13/2014, 5:08 PM

## 2014-04-23 ENCOUNTER — Ambulatory Visit (INDEPENDENT_AMBULATORY_CARE_PROVIDER_SITE_OTHER): Payer: Self-pay | Admitting: *Deleted

## 2014-04-23 DIAGNOSIS — I472 Ventricular tachycardia, unspecified: Secondary | ICD-10-CM

## 2014-04-23 DIAGNOSIS — I4729 Other ventricular tachycardia: Secondary | ICD-10-CM

## 2014-04-23 LAB — MDC_IDC_ENUM_SESS_TYPE_REMOTE
Battery Remaining Longevity: 132 mo
Date Time Interrogation Session: 20150921073523
HighPow Impedance: 0 Ohm
HighPow Impedance: 65 Ohm
Lead Channel Impedance Value: 551 Ohm
Lead Channel Pacing Threshold Amplitude: 0.625 V
Lead Channel Pacing Threshold Pulse Width: 0.4 ms
Lead Channel Sensing Intrinsic Amplitude: 8 mV
Lead Channel Sensing Intrinsic Amplitude: 8 mV
Lead Channel Setting Pacing Amplitude: 2 V
MDC IDC MSMT BATTERY VOLTAGE: 3.03 V
MDC IDC SET LEADCHNL RV PACING PULSEWIDTH: 0.4 ms
MDC IDC SET LEADCHNL RV SENSING SENSITIVITY: 0.3 mV
MDC IDC SET ZONE DETECTION INTERVAL: 240 ms
MDC IDC SET ZONE DETECTION INTERVAL: 300 ms
MDC IDC STAT BRADY RV PERCENT PACED: 0.2 %
Zone Setting Detection Interval: 350 ms

## 2014-04-23 NOTE — Progress Notes (Signed)
Remote ICD transmission.   

## 2014-05-07 ENCOUNTER — Encounter: Payer: Self-pay | Admitting: Cardiology

## 2014-05-15 ENCOUNTER — Encounter: Payer: Self-pay | Admitting: Internal Medicine

## 2014-07-12 ENCOUNTER — Encounter (HOSPITAL_COMMUNITY): Payer: Self-pay | Admitting: Cardiology

## 2014-07-24 ENCOUNTER — Ambulatory Visit (INDEPENDENT_AMBULATORY_CARE_PROVIDER_SITE_OTHER): Payer: Self-pay | Admitting: *Deleted

## 2014-07-24 DIAGNOSIS — I472 Ventricular tachycardia, unspecified: Secondary | ICD-10-CM

## 2014-07-24 LAB — MDC_IDC_ENUM_SESS_TYPE_REMOTE
Battery Remaining Longevity: 128 mo
Brady Statistic RV Percent Paced: 0 %
HighPow Impedance: 72 Ohm
Lead Channel Impedance Value: 551 Ohm
Lead Channel Pacing Threshold Amplitude: 0.625 V
Lead Channel Setting Pacing Amplitude: 2 V
Lead Channel Setting Sensing Sensitivity: 0.3 mV
MDC IDC MSMT LEADCHNL RV PACING THRESHOLD PULSEWIDTH: 0.4 ms
MDC IDC MSMT LEADCHNL RV SENSING INTR AMPL: 8.9 mV
MDC IDC SET LEADCHNL RV PACING PULSEWIDTH: 0.4 ms
MDC IDC SET ZONE DETECTION INTERVAL: 240 ms
Zone Setting Detection Interval: 300 ms
Zone Setting Detection Interval: 350 ms

## 2014-07-24 NOTE — Progress Notes (Signed)
Remote ICD transmission.   

## 2014-07-31 ENCOUNTER — Encounter: Payer: Self-pay | Admitting: Cardiology

## 2014-08-16 ENCOUNTER — Encounter: Payer: Self-pay | Admitting: Internal Medicine

## 2014-08-16 ENCOUNTER — Telehealth: Payer: Self-pay | Admitting: Internal Medicine

## 2014-08-16 NOTE — Telephone Encounter (Signed)
New message      Pt c/o of Chest Pain: 1. Are you having CP right now? A little bit 2. Are you experiencing any other symptoms (ex. SOB, nausea, vomiting, sweating)? Nausea, lightheaded 3. How long have you been experiencing CP? 5 days-----worse today 4. Is your CP continuous or coming and going? Comes and goes.  Pt has an ICD and had a sharp pain 2 weeks ago---not sure if it went off or what the pain was 5. Have you taken Nitroglycerin? No---do not have a presc

## 2014-08-16 NOTE — Telephone Encounter (Signed)
Spoke with Melina Copa flex with D[employer] regarding pt's Symptoms; she recommended to placed pt on the scheduled for tomorrow with Jorja Loa NP, and if pt's symptoms get worse he is to go to the ER. Pt is aware and verbalized understanding. An appointment was made for pt at 11:30  AM tomorrow with Jorja Loa NP. Pt is aware. Pt also ask  to call his employer to let him know of pt's appointment. Spoke with Mr. Vickii Chafe and let him know about of pt not feeling well and of pt's appointment for tomorrow 08/17/14 at 11:30 AM. Pt's  Employer verbalized understanding.

## 2014-08-16 NOTE — Telephone Encounter (Signed)
Pt called because he said he has been having slight chest pain on and off for the last 4 to 5 days.  The pain last about 30 minutes at the time. Today it is getting worse he feels weak no energy, nauseas, light headed. In another words he feels awull.  He said the the pain is like he has a heart burn but it is on his left side of his chest. BP systolic is [id number], HR 82. Pt has an ICD. Pt would like to be seen today. He does not want to the ER.

## 2014-08-17 ENCOUNTER — Encounter: Payer: Self-pay | Admitting: Nurse Practitioner

## 2014-08-17 ENCOUNTER — Ambulatory Visit (INDEPENDENT_AMBULATORY_CARE_PROVIDER_SITE_OTHER): Payer: Self-pay | Admitting: Nurse Practitioner

## 2014-08-17 ENCOUNTER — Ambulatory Visit (INDEPENDENT_AMBULATORY_CARE_PROVIDER_SITE_OTHER): Payer: Self-pay | Admitting: *Deleted

## 2014-08-17 VITALS — BP 124/84 | HR 77 | Ht 70.0 in | Wt 212.0 lb

## 2014-08-17 DIAGNOSIS — I472 Ventricular tachycardia, unspecified: Secondary | ICD-10-CM | POA: Insufficient documentation

## 2014-08-17 DIAGNOSIS — I5042 Chronic combined systolic (congestive) and diastolic (congestive) heart failure: Secondary | ICD-10-CM | POA: Insufficient documentation

## 2014-08-17 DIAGNOSIS — I10 Essential (primary) hypertension: Secondary | ICD-10-CM

## 2014-08-17 DIAGNOSIS — I255 Ischemic cardiomyopathy: Secondary | ICD-10-CM

## 2014-08-17 DIAGNOSIS — E785 Hyperlipidemia, unspecified: Secondary | ICD-10-CM

## 2014-08-17 DIAGNOSIS — R5383 Other fatigue: Secondary | ICD-10-CM

## 2014-08-17 DIAGNOSIS — I2510 Atherosclerotic heart disease of native coronary artery without angina pectoris: Secondary | ICD-10-CM

## 2014-08-17 DIAGNOSIS — I5043 Acute on chronic combined systolic (congestive) and diastolic (congestive) heart failure: Secondary | ICD-10-CM

## 2014-08-17 LAB — CBC WITH DIFFERENTIAL/PLATELET
BASOS ABS: 0 10*3/uL (ref 0.0–0.1)
BASOS PCT: 0.6 % (ref 0.0–3.0)
Eosinophils Absolute: 0.3 10*3/uL (ref 0.0–0.7)
Eosinophils Relative: 3.7 % (ref 0.0–5.0)
HCT: 41.2 % (ref 39.0–52.0)
HEMOGLOBIN: 13.5 g/dL (ref 13.0–17.0)
Lymphocytes Relative: 23.6 % (ref 12.0–46.0)
Lymphs Abs: 1.7 10*3/uL (ref 0.7–4.0)
MCHC: 32.7 g/dL (ref 30.0–36.0)
MCV: 95.6 fl (ref 78.0–100.0)
MONO ABS: 0.6 10*3/uL (ref 0.1–1.0)
MONOS PCT: 7.6 % (ref 3.0–12.0)
NEUTROS ABS: 4.7 10*3/uL (ref 1.4–7.7)
NEUTROS PCT: 64.5 % (ref 43.0–77.0)
Platelets: 161 10*3/uL (ref 150.0–400.0)
RBC: 4.31 Mil/uL (ref 4.22–5.81)
RDW: 13 % (ref 11.5–15.5)
WBC: 7.3 10*3/uL (ref 4.0–10.5)

## 2014-08-17 LAB — MDC_IDC_ENUM_SESS_TYPE_INCLINIC
Battery Remaining Longevity: 130 mo
Brady Statistic RV Percent Paced: 0.08 %
Date Time Interrogation Session: 20160115133005
HighPow Impedance: 0 Ohm
HighPow Impedance: 69 Ohm
Lead Channel Impedance Value: 551 Ohm
Lead Channel Pacing Threshold Pulse Width: 0.4 ms
Lead Channel Sensing Intrinsic Amplitude: 9.125 mV
Lead Channel Setting Pacing Pulse Width: 0.4 ms
MDC IDC MSMT BATTERY VOLTAGE: 3.04 V
MDC IDC MSMT LEADCHNL RV PACING THRESHOLD AMPLITUDE: 0.75 V
MDC IDC MSMT LEADCHNL RV SENSING INTR AMPL: 10.25 mV
MDC IDC SET LEADCHNL RV PACING AMPLITUDE: 2 V
MDC IDC SET LEADCHNL RV SENSING SENSITIVITY: 0.3 mV
Zone Setting Detection Interval: 240 ms
Zone Setting Detection Interval: 300 ms
Zone Setting Detection Interval: 350 ms

## 2014-08-17 LAB — BASIC METABOLIC PANEL
BUN: 19 mg/dL (ref 6–23)
CO2: 28 mEq/L (ref 19–32)
CREATININE: 1.73 mg/dL — AB (ref 0.40–1.50)
Calcium: 9.3 mg/dL (ref 8.4–10.5)
Chloride: 109 mEq/L (ref 96–112)
GFR: 43.35 mL/min — ABNORMAL LOW (ref >60.00)
Glucose, Bld: 98 mg/dL (ref 70–99)
POTASSIUM: 4.5 meq/L (ref 3.5–5.1)
SODIUM: 143 meq/L (ref 135–145)

## 2014-08-17 LAB — TSH: TSH: 2.47 u[IU]/mL (ref 0.35–4.50)

## 2014-08-17 LAB — D-DIMER, QUANTITATIVE (NOT AT ARMC): D DIMER QUANT: 0.76 ug{FEU}/mL — AB (ref 0.00–0.48)

## 2014-08-17 MED ORDER — FUROSEMIDE 40 MG PO TABS: 40.0000 mg | ORAL_TABLET | Freq: Every day | ORAL | Status: DC

## 2014-08-17 MED ORDER — LISINOPRIL 10 MG PO TABS: 10.0000 mg | ORAL_TABLET | Freq: Every day | ORAL | Status: DC
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
## 2014-08-17 NOTE — Patient Instructions (Signed)
Your physician has recommended you make the following change in your medication:  1) Stop lisinopril/ hctz 2) Start lisinopril 10 mg one tablet by mouth once daily 3) Start lasix (furosemide) 40 mg one tablet by mouth once daily  Your physician recommends that you have lab work today: CBC/ BMP/ TSH  Your physician has requested that you have an echocardiogram. Echocardiography is a painless test that uses sound waves to create images of your heart. It provides your doctor with information about the size and shape of your heart and how well your heart's chambers and valves are working. This procedure takes approximately one hour. There are no restrictions for this procedure.  Your physician has requested that you have an exercise stress myoview. For further information please visit [email or web address]. Please follow instruction sheet, as given.  Your physician recommends that you schedule a follow-up appointment in: 2 weeks with Ignacia Bayley, NP (ok to book on 08/30/14- flex schedule)

## 2014-08-17 NOTE — Progress Notes (Signed)
ICD check in clinic w/ Murray Hodgkins, PA. Normal device function. Threshold and sensing consistent with previous device measurements. Impedance trends stable over time. No evidence of any ventricular arrhythmias. Histogram distribution appropriate for patient and level of activity. No changes made this session. Device programmed at appropriate safety margins. Device programmed to optimize intrinsic conduction. Estimated longevity 10.67yrs. Pt enrolled in remote follow-up. Alert tones demonstrated for patient, pt knows to call clinic if heard. ROV w/ SK 3/16.

## 2014-08-23 ENCOUNTER — Ambulatory Visit (HOSPITAL_BASED_OUTPATIENT_CLINIC_OR_DEPARTMENT_OTHER): Payer: Self-pay | Admitting: Cardiology

## 2014-08-23 ENCOUNTER — Other Ambulatory Visit: Payer: Self-pay | Admitting: *Deleted

## 2014-08-23 ENCOUNTER — Ambulatory Visit (HOSPITAL_COMMUNITY): Payer: Self-pay | Attending: Cardiology | Admitting: Radiology

## 2014-08-23 ENCOUNTER — Ambulatory Visit (HOSPITAL_BASED_OUTPATIENT_CLINIC_OR_DEPARTMENT_OTHER): Payer: Self-pay | Admitting: Radiology

## 2014-08-23 ENCOUNTER — Other Ambulatory Visit (INDEPENDENT_AMBULATORY_CARE_PROVIDER_SITE_OTHER): Payer: Self-pay | Admitting: *Deleted

## 2014-08-23 ENCOUNTER — Encounter (HOSPITAL_COMMUNITY): Payer: Self-pay

## 2014-08-23 DIAGNOSIS — R252 Cramp and spasm: Secondary | ICD-10-CM

## 2014-08-23 DIAGNOSIS — R600 Localized edema: Secondary | ICD-10-CM

## 2014-08-23 DIAGNOSIS — Z86718 Personal history of other venous thrombosis and embolism: Secondary | ICD-10-CM

## 2014-08-23 DIAGNOSIS — I5043 Acute on chronic combined systolic (congestive) and diastolic (congestive) heart failure: Secondary | ICD-10-CM

## 2014-08-23 DIAGNOSIS — E785 Hyperlipidemia, unspecified: Secondary | ICD-10-CM | POA: Insufficient documentation

## 2014-08-23 DIAGNOSIS — R791 Abnormal coagulation profile: Secondary | ICD-10-CM

## 2014-08-23 DIAGNOSIS — R079 Chest pain, unspecified: Secondary | ICD-10-CM | POA: Insufficient documentation

## 2014-08-23 DIAGNOSIS — R5383 Other fatigue: Secondary | ICD-10-CM

## 2014-08-23 DIAGNOSIS — I10 Essential (primary) hypertension: Secondary | ICD-10-CM | POA: Insufficient documentation

## 2014-08-23 DIAGNOSIS — I2510 Atherosclerotic heart disease of native coronary artery without angina pectoris: Secondary | ICD-10-CM

## 2014-08-23 DIAGNOSIS — R0609 Other forms of dyspnea: Secondary | ICD-10-CM | POA: Insufficient documentation

## 2014-08-23 DIAGNOSIS — R002 Palpitations: Secondary | ICD-10-CM | POA: Insufficient documentation

## 2014-08-23 DIAGNOSIS — R0602 Shortness of breath: Secondary | ICD-10-CM | POA: Insufficient documentation

## 2014-08-23 DIAGNOSIS — R7989 Other specified abnormal findings of blood chemistry: Secondary | ICD-10-CM

## 2014-08-23 LAB — BASIC METABOLIC PANEL
BUN: 46 mg/dL — ABNORMAL HIGH (ref 6–23)
CHLORIDE: 104 meq/L (ref 96–112)
CO2: 26 mEq/L (ref 19–32)
Calcium: 9 mg/dL (ref 8.4–10.5)
Creatinine, Ser: 2.4 mg/dL — ABNORMAL HIGH (ref 0.40–1.50)
GFR: 29.71 mL/min — ABNORMAL LOW (ref >60.00)
Glucose, Bld: 102 mg/dL — ABNORMAL HIGH (ref 70–99)
POTASSIUM: 4.1 meq/L (ref 3.5–5.1)
Sodium: 140 mEq/L (ref 135–145)

## 2014-08-23 MED ORDER — POTASSIUM CHLORIDE CRYS ER 20 MEQ PO TBCR: 20.0000 meq | EXTENDED_RELEASE_TABLET | Freq: Every day | ORAL | Status: DC

## 2014-08-23 MED ORDER — TECHNETIUM TC 99M SESTAMIBI GENERIC - CARDIOLITE
33.0000 | Freq: Once | INTRAVENOUS | Status: AC | PRN
  Administered 2014-08-23: 33 via INTRAVENOUS

## 2014-08-23 MED ORDER — REGADENOSON 0.4 MG/5ML IV SOLN
0.4000 mg | Freq: Once | INTRAVENOUS | Status: AC
  Administered 2014-08-23: 0.4 mg via INTRAVENOUS

## 2014-08-23 MED ORDER — TECHNETIUM TC 99M SESTAMIBI GENERIC - CARDIOLITE
11.0000 | Freq: Once | INTRAVENOUS | Status: AC | PRN
  Administered 2014-08-23: 11 via INTRAVENOUS

## 2014-08-23 NOTE — Progress Notes (Signed)
Bilateral lower venous duplex performed  

## 2014-08-23 NOTE — Progress Notes (Signed)
Echocardiogram performed.  

## 2014-08-23 NOTE — Progress Notes (Signed)
Patient ID: Richard Cook, male   DOB: August 17, 1955, 59 y.o.   MRN: [id number] Richard Cook is here for a Exercise Cardiolite study today. The patient asked about his recent lab work results. There was a documentation in Epic that Dr. Olin Pia office had attempted to call the patient regarding his lab work but the [employer] is full.  The patient states the swelling is his ankles is better with the lasix, but he is still having DOE. Exertional chest pain of seconds duration, last episode one week ago. Dr. Virl Axe reviewed the patient's lab work including the D Dimer with an order to schedule the patient for a Lower bilateral venous doppler, and change the patient to a [place] study today per Trinidad Curet, RN (Dr. Olin Pia nurse). Irven [place], RN.

## 2014-08-23 NOTE — Progress Notes (Signed)
[facility] [name] [address] [place], [place] [address] [phone]    Cardiology Nuclear Med Study  Richard Cook is a 60 y.o. male     MRN : [id number]     DOB: [birth year]  Procedure Date: 08/23/2014  Nuclear Med Background Indication for Stress Test:  Evaluation for Ischemia, Graft and Stent Patency  History:  AICD,PTVP VT Cardiac Risk Factors: Hypertension and Lipids  Symptoms:  Chest Pain, DOE, Palpitations and SOB   Nuclear Pre-Procedure Caffeine/Decaff Intake:  None> 12 hrs NPO After: 3:00pm   Lungs:  clear O2 Sat: 97% on room air. IV 0.9% NS with Angio Cath:  22g  IV Site: R Wrist x 1, tolerated well IV Started by:  Irven Baltimore, RN  Chest Size (in):  44 Cup Size: n/a  Height: 5\' 10"  (1.778 m)  Weight:  202 lb (91.627 kg)  BMI:  Body mass index is 28.98 kg/(m^2). Tech Comments:  Patient took Coreg 6:00pm yesterday. Irven Baltimore, RN.    Nuclear Med Study 1 or 2 day study: 1 day  Stress Test Type:  Carlton Adam  Reading MD: N/A  Order Authorizing Provider:  Virl Axe, MD, and Murray Hodgkins, NP. Irven Baltimore, RN.  Resting Radionuclide: Technetium 19m Sestamibi  Resting Radionuclide Dose: 11.0 mCi   Stress Radionuclide:  Technetium 69m Sestamibi  Stress Radionuclide Dose: 33.0 mCi           Stress Protocol Rest HR: 51 Stress HR: 77  Rest BP: 98/72 Stress BP: 104/68  Exercise Time (min): n/a METS: n/a   Predicted Max HR: 163 bpm % Max HR: 47.24 bpm Rate Pressure Product: 8008   Dose of Adenosine (mg):  n/a Dose of Lexiscan: 0.4 mg  Dose of Atropine (mg): n/a Dose of Dobutamine: n/a mcg/kg/min (at max HR)  Stress Test Technologist: Perrin Maltese, EMT-P  Nuclear Technologist:  Earl Many, CNMT     Rest Procedure:  Myocardial perfusion imaging was performed at rest 45 minutes following the intravenous administration of Technetium 38m Sestamibi. Rest [id number] bradycardia  51 bpm  Occasional PVC  Stress Procedure:   The patient received IV Lexiscan 0.4 mg over 15-seconds.  Technetium 44m Sestamibi injected at 30-seconds. This patient had nausea with the Lexiscan injection. Quantitative spect images were obtained after a 45 minute delay. Stress ECG: No significant change from baseline ECG  QPS Raw Data Images: SOft tissue (diaphragm, bowel activity) underlie heart   Stress Images:  Large severe defect in the inferior wall (base, mid, distal) inferolateral wall (base, mid, distal), lateral wall (base, mid, distal) and apex.  Normal perfusion elsewhere.   Rest Images:  No significant improvement from the stress images .  Subtraction (SDS):  No evidence of ischemia. Transient Ischemic Dilatation (Normal <1.22):  0.97 Lung/Heart Ratio (Normal <0.45):  0.33  Quantitative Gated Spect Images QGS EDV:  NA ml QGS ESV:  NA ml  Impression Exercise Capacity:  Lexiscan with no exercise. BP Response:  Normal blood pressure response. Clinical Symptoms:  No chest pain. ECG Impression:  No significant ST segment change suggestive of ischemia. Comparison with Prior Nuclear Study: No prior scan   Overall Impression:  Large fixed defect in the inferior, inferolateral, lateral and apical walls consistent with scar.  No significant ischemia    LV Ejection Fraction: Study not gated.  LV Wall Motion:  Images not gated     [name]

## 2014-08-27 ENCOUNTER — Telehealth: Payer: Self-pay | Admitting: *Deleted

## 2014-08-27 DIAGNOSIS — N289 Disorder of kidney and ureter, unspecified: Secondary | ICD-10-CM

## 2014-08-27 DIAGNOSIS — R0602 Shortness of breath: Secondary | ICD-10-CM

## 2014-08-27 NOTE — Telephone Encounter (Signed)
Called patient concerning his lab results and he mentioned that he got a rash from where the gel was placed during his recent ultrasound test. He states the rash is starting to go away. Advised to take benadryl prn for itching. Will let Ignacia Bayley NP and Dr.Klein know. Appointment with Gerald Stabs on 1/28.

## 2014-08-27 NOTE — Telephone Encounter (Signed)
Thank you Kennyth Lose.  I agree.

## 2014-08-29 ENCOUNTER — Telehealth: Payer: Self-pay | Admitting: *Deleted

## 2014-08-29 NOTE — Telephone Encounter (Signed)
Attempt #3 to contact patient regarding lab results/test results. Phone #1: LMTCB; Phone #2: Mailbox full.

## 2014-08-29 NOTE — Telephone Encounter (Signed)
-----   Message from Rogelia Mire, NP sent at 08/17/2014  5:10 PM EST ----- Tsh nl, creat stable.  CBC nl.  No explanation for Ss.  D dimer addressed in another result note.

## 2014-08-30 ENCOUNTER — Other Ambulatory Visit (INDEPENDENT_AMBULATORY_CARE_PROVIDER_SITE_OTHER): Payer: Self-pay | Admitting: *Deleted

## 2014-08-30 ENCOUNTER — Ambulatory Visit (INDEPENDENT_AMBULATORY_CARE_PROVIDER_SITE_OTHER): Payer: Self-pay | Admitting: Nurse Practitioner

## 2014-08-30 ENCOUNTER — Encounter: Payer: Self-pay | Admitting: Nurse Practitioner

## 2014-08-30 VITALS — BP 118/70 | HR 74 | Ht 70.0 in | Wt 207.0 lb

## 2014-08-30 DIAGNOSIS — I472 Ventricular tachycardia, unspecified: Secondary | ICD-10-CM

## 2014-08-30 DIAGNOSIS — R791 Abnormal coagulation profile: Secondary | ICD-10-CM

## 2014-08-30 DIAGNOSIS — I5042 Chronic combined systolic (congestive) and diastolic (congestive) heart failure: Secondary | ICD-10-CM

## 2014-08-30 DIAGNOSIS — I2510 Atherosclerotic heart disease of native coronary artery without angina pectoris: Secondary | ICD-10-CM

## 2014-08-30 DIAGNOSIS — R7989 Other specified abnormal findings of blood chemistry: Secondary | ICD-10-CM

## 2014-08-30 DIAGNOSIS — I255 Ischemic cardiomyopathy: Secondary | ICD-10-CM

## 2014-08-30 DIAGNOSIS — N183 Chronic kidney disease, stage 3 unspecified: Secondary | ICD-10-CM

## 2014-08-30 DIAGNOSIS — N289 Disorder of kidney and ureter, unspecified: Secondary | ICD-10-CM

## 2014-08-30 DIAGNOSIS — R0602 Shortness of breath: Secondary | ICD-10-CM

## 2014-08-30 LAB — BASIC METABOLIC PANEL
BUN: 41 mg/dL — AB (ref 6–23)
CHLORIDE: 107 meq/L (ref 96–112)
CO2: 26 mEq/L (ref 19–32)
Calcium: 9.7 mg/dL (ref 8.4–10.5)
Creatinine, Ser: 2.45 mg/dL — ABNORMAL HIGH (ref 0.40–1.50)
GFR: 29.01 mL/min — ABNORMAL LOW (ref >60.00)
Glucose, Bld: 105 mg/dL — ABNORMAL HIGH (ref 70–99)
Potassium: 4.8 mEq/L (ref 3.5–5.1)
Sodium: 142 mEq/L (ref 135–145)

## 2014-08-30 LAB — BRAIN NATRIURETIC PEPTIDE: PRO B NATRI PEPTIDE: 87 pg/mL (ref 0.0–100.0)

## 2014-08-30 MED ORDER — CARVEDILOL 3.125 MG PO TABS: 3.1250 mg | ORAL_TABLET | Freq: Two times a day (BID) | ORAL | Status: DC
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
## 2014-08-30 NOTE — Patient Instructions (Addendum)
Your physician recommends that you continue on your current medications as directed. Please refer to the Current Medication list given to you today.  Your physician recommends that you have a [facility] Pulmonary Perf and Vent (VQ Scan) and Chest xray at [facility] recommends that you schedule a follow-up appointment in: 1 month with Dr. [name].

## 2014-09-03 ENCOUNTER — Encounter: Payer: Self-pay | Admitting: Internal Medicine

## 2014-09-05 ENCOUNTER — Ambulatory Visit (HOSPITAL_COMMUNITY)
Admission: RE | Admit: 2014-09-05 | Discharge: 2014-09-05 | Disposition: A | Discharge status: Home / Self Care | Payer: MEDICAID | Source: Ambulatory Visit | Attending: Nurse Practitioner | Admitting: Nurse Practitioner

## 2014-09-05 DIAGNOSIS — R0602 Shortness of breath: Secondary | ICD-10-CM | POA: Insufficient documentation

## 2014-09-05 DIAGNOSIS — R7989 Other specified abnormal findings of blood chemistry: Secondary | ICD-10-CM

## 2014-09-05 DIAGNOSIS — R791 Abnormal coagulation profile: Secondary | ICD-10-CM | POA: Insufficient documentation

## 2014-09-05 DIAGNOSIS — Z86718 Personal history of other venous thrombosis and embolism: Secondary | ICD-10-CM | POA: Insufficient documentation

## 2014-09-05 DIAGNOSIS — Z951 Presence of aortocoronary bypass graft: Secondary | ICD-10-CM | POA: Insufficient documentation

## 2014-09-05 MED ORDER — TECHNETIUM TO 99M ALBUMIN AGGREGATED
6.0000 | Freq: Once | INTRAVENOUS | Status: AC | PRN
  Administered 2014-09-05: 6 via INTRAVENOUS

## 2014-09-05 MED ORDER — TECHNETIUM TC 99M DIETHYLENETRIAME-PENTAACETIC ACID: 40.0000 | Freq: Once | INTRAVENOUS | Status: AC | PRN

## 2014-09-10 ENCOUNTER — Other Ambulatory Visit: Payer: Self-pay

## 2014-09-10 DIAGNOSIS — N183 Chronic kidney disease, stage 3 unspecified: Secondary | ICD-10-CM

## 2014-09-10 DIAGNOSIS — N189 Chronic kidney disease, unspecified: Secondary | ICD-10-CM

## 2014-09-10 DIAGNOSIS — N289 Disorder of kidney and ureter, unspecified: Secondary | ICD-10-CM

## 2014-09-25 ENCOUNTER — Encounter (HOSPITAL_COMMUNITY): Payer: Self-pay | Admitting: Physical Medicine and Rehabilitation

## 2014-09-25 ENCOUNTER — Emergency Department (HOSPITAL_COMMUNITY)
Admission: EM | Admit: 2014-09-25 | Discharge: 2014-09-25 | Disposition: A | Discharge status: Home / Self Care | Payer: Worker's Compensation | Attending: Emergency Medicine | Admitting: Emergency Medicine

## 2014-09-25 ENCOUNTER — Emergency Department (HOSPITAL_COMMUNITY): Payer: Worker's Compensation

## 2014-09-25 DIAGNOSIS — F419 Anxiety disorder, unspecified: Secondary | ICD-10-CM | POA: Insufficient documentation

## 2014-09-25 DIAGNOSIS — S060X1A Concussion with loss of consciousness of 30 minutes or less, initial encounter: Secondary | ICD-10-CM | POA: Insufficient documentation

## 2014-09-25 DIAGNOSIS — Z7982 Long term (current) use of aspirin: Secondary | ICD-10-CM | POA: Diagnosis not present

## 2014-09-25 DIAGNOSIS — I129 Hypertensive chronic kidney disease with stage 1 through stage 4 chronic kidney disease, or unspecified chronic kidney disease: Secondary | ICD-10-CM | POA: Insufficient documentation

## 2014-09-25 DIAGNOSIS — Z86718 Personal history of other venous thrombosis and embolism: Secondary | ICD-10-CM | POA: Insufficient documentation

## 2014-09-25 DIAGNOSIS — W01198A Fall on same level from slipping, tripping and stumbling with subsequent striking against other object, initial encounter: Secondary | ICD-10-CM | POA: Insufficient documentation

## 2014-09-25 DIAGNOSIS — I5042 Chronic combined systolic (congestive) and diastolic (congestive) heart failure: Secondary | ICD-10-CM | POA: Diagnosis not present

## 2014-09-25 DIAGNOSIS — Z8739 Personal history of other diseases of the musculoskeletal system and connective tissue: Secondary | ICD-10-CM | POA: Diagnosis not present

## 2014-09-25 DIAGNOSIS — Z79899 Other long term (current) drug therapy: Secondary | ICD-10-CM | POA: Insufficient documentation

## 2014-09-25 DIAGNOSIS — N183 Chronic kidney disease, stage 3 (moderate): Secondary | ICD-10-CM | POA: Diagnosis not present

## 2014-09-25 DIAGNOSIS — I2510 Atherosclerotic heart disease of native coronary artery without angina pectoris: Secondary | ICD-10-CM | POA: Insufficient documentation

## 2014-09-25 DIAGNOSIS — E785 Hyperlipidemia, unspecified: Secondary | ICD-10-CM | POA: Insufficient documentation

## 2014-09-25 DIAGNOSIS — Y9389 Activity, other specified: Secondary | ICD-10-CM | POA: Diagnosis not present

## 2014-09-25 DIAGNOSIS — Z872 Personal history of diseases of the skin and subcutaneous tissue: Secondary | ICD-10-CM | POA: Diagnosis not present

## 2014-09-25 DIAGNOSIS — S0990XA Unspecified injury of head, initial encounter: Secondary | ICD-10-CM | POA: Diagnosis present

## 2014-09-25 DIAGNOSIS — Y990 Civilian activity done for income or pay: Secondary | ICD-10-CM | POA: Insufficient documentation

## 2014-09-25 DIAGNOSIS — Y9289 Other specified places as the place of occurrence of the external cause: Secondary | ICD-10-CM | POA: Diagnosis not present

## 2014-09-25 DIAGNOSIS — W19XXXA Unspecified fall, initial encounter: Secondary | ICD-10-CM

## 2014-09-25 DIAGNOSIS — Z8669 Personal history of other diseases of the nervous system and sense organs: Secondary | ICD-10-CM | POA: Diagnosis not present

## 2014-09-25 DIAGNOSIS — S069X1A Unspecified intracranial injury with loss of consciousness of 30 minutes or less, initial encounter: Secondary | ICD-10-CM
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
## 2014-09-25 NOTE — ED Notes (Signed)
Pt remains monitored by blood pressure, pulse ox, and 5 lead.  

## 2014-09-25 NOTE — ED Notes (Signed)
Patient denies any pain, stable, ambulatory.

## 2014-09-25 NOTE — ED Notes (Signed)
Pt returned from xray

## 2014-09-25 NOTE — ED Notes (Signed)
Pt brought back to room via wheelchair; pt undressed and in a gown at this time; Tiffany, PA at bedside and transporter is ready to take pt to CT

## 2014-09-25 NOTE — ED Notes (Signed)
Pt presents to department for evaluation of fall. States he tripped and fell over chair last night onto concrete. Reports he did hit head and passed out. Reports he is very sleep and nauseated today. States headache, and back pain. Pt is alert and oriented x4.

## 2014-09-25 NOTE — Discharge Instructions (Signed)
Concussion  A concussion, or closed-head injury, is a brain injury caused by a direct blow to the head or by a quick and sudden movement (jolt) of the head or neck. Concussions are usually not life-threatening. Even so, the effects of a concussion can be serious. If you have had a concussion before, you are more likely to experience concussion-like symptoms after a direct blow to the head.   CAUSES  · Direct blow to the head, such as from running into another player during a soccer game, being hit in a fight, or hitting your head on a hard surface.  · A jolt of the head or neck that causes the brain to move back and forth inside the skull, such as in a car crash.  SIGNS AND SYMPTOMS  The signs of a concussion can be hard to notice. Early on, they may be missed by you, family members, and health care providers. You may look fine but act or feel differently.  Symptoms are usually temporary, but they may last for days, weeks, or even longer. Some symptoms may appear right away while others may not show up for hours or days. Every head injury is different. Symptoms include:  · Mild to moderate headaches that will not go away.  · A feeling of pressure inside your head.  · Having more trouble than usual:  ¨ Learning or remembering things you have heard.  ¨ Answering questions.  ¨ Paying attention or concentrating.  ¨ Organizing daily tasks.  ¨ Making decisions and solving problems.  · Slowness in thinking, acting or reacting, speaking, or reading.  · Getting lost or being easily confused.  · Feeling tired all the time or lacking energy (fatigued).  · Feeling drowsy.  · Sleep disturbances.  ¨ Sleeping more than usual.  ¨ Sleeping less than usual.  ¨ Trouble falling asleep.  ¨ Trouble sleeping (insomnia).  · Loss of balance or feeling lightheaded or dizzy.  · Nausea or vomiting.  · Numbness or tingling.  · Increased sensitivity to:  ¨ Sounds.  ¨ Lights.  ¨ Distractions.  · Vision problems or eyes that tire  easily.  · Diminished sense of taste or smell.  · Ringing in the ears.  · Mood changes such as feeling sad or anxious.  · Becoming easily irritated or angry for little or no reason.  · Lack of motivation.  · Seeing or hearing things other people do not see or hear (hallucinations).  DIAGNOSIS  Your health care provider can usually diagnose a concussion based on a description of your injury and symptoms. He or she will ask whether you passed out (lost consciousness) and whether you are having trouble remembering events that happened right before and during your injury.  Your evaluation might include:  · A brain scan to look for signs of injury to the brain. Even if the test shows no injury, you may still have a concussion.  · Blood tests to be sure other problems are not present.  TREATMENT  · Concussions are usually treated in an emergency department, in urgent care, or at a clinic. You may need to stay in the hospital overnight for further treatment.  · Tell your health care provider if you are taking any medicines, including prescription medicines, over-the-counter medicines, and natural remedies. Some medicines, such as blood thinners (anticoagulants) and aspirin, may increase the chance of complications. Also tell your health care provider whether you have had alcohol or are taking illegal drugs. This information   may affect treatment.  · Your health care provider will send you home with important instructions to follow.  · How fast you will recover from a concussion depends on many factors. These factors include how severe your concussion is, what part of your brain was injured, your age, and how healthy you were before the concussion.  · Most people with mild injuries recover fully. Recovery can take time. In general, recovery is slower in older persons. Also, persons who have had a concussion in the past or have other medical problems may find that it takes longer to recover from their current injury.  HOME  CARE INSTRUCTIONS  General Instructions  · Carefully follow the directions your health care provider gave you.  · Only take over-the-counter or prescription medicines for pain, discomfort, or fever as directed by your health care provider.  · Take only those medicines that your health care provider has approved.  · Do not drink alcohol until your health care provider says you are well enough to do so. Alcohol and certain other drugs may slow your recovery and can put you at risk of further injury.  · If it is harder than usual to remember things, write them down.  · If you are easily distracted, try to do one thing at a time. For example, do not try to watch TV while fixing dinner.  · Talk with family members or close friends when making important decisions.  · Keep all follow-up appointments. Repeated evaluation of your symptoms is recommended for your recovery.  · Watch your symptoms and tell others to do the same. Complications sometimes occur after a concussion. Older adults with a brain injury may have a higher risk of serious complications, such as a blood clot on the brain.  · Tell your teachers, school nurse, school counselor, coach, athletic trainer, or work manager about your injury, symptoms, and restrictions. Tell them about what you can or cannot do. They should watch for:  ¨ Increased problems with attention or concentration.  ¨ Increased difficulty remembering or learning new information.  ¨ Increased time needed to complete tasks or assignments.  ¨ Increased irritability or decreased ability to cope with stress.  ¨ Increased symptoms.  · Rest. Rest helps the brain to heal. Make sure you:  ¨ Get plenty of sleep at night. Avoid staying up late at night.  ¨ Keep the same bedtime hours on weekends and weekdays.  ¨ Rest during the day. Take daytime naps or rest breaks when you feel tired.  · Limit activities that require a lot of thought or concentration. These include:  ¨ Doing homework or job-related  work.  ¨ Watching TV.  ¨ Working on the computer.  · Avoid any situation where there is potential for another head injury (football, hockey, soccer, basketball, martial arts, downhill snow sports and horseback riding). Your condition will get worse every time you experience a concussion. You should avoid these activities until you are evaluated by the appropriate follow-up health care providers.  Returning To Your Regular Activities  You will need to return to your normal activities slowly, not all at once. You must give your body and brain enough time for recovery.  · Do not return to sports or other athletic activities until your health care provider tells you it is safe to do so.  · Ask your health care provider when you can drive, ride a bicycle, or operate heavy machinery. Your ability to react may be slower after a   brain injury. Never do these activities if you are dizzy.  · Ask your health care provider about when you can return to work or school.  Preventing Another Concussion  It is very important to avoid another brain injury, especially before you have recovered. In rare cases, another injury can lead to permanent brain damage, brain swelling, or death. The risk of this is greatest during the first 7-10 days after a head injury. Avoid injuries by:  · Wearing a seat belt when riding in a car.  · Drinking alcohol only in moderation.  · Wearing a helmet when biking, skiing, skateboarding, skating, or doing similar activities.  · Avoiding activities that could lead to a second concussion, such as contact or recreational sports, until your health care provider says it is okay.  · Taking safety measures in your home.  ¨ Remove clutter and tripping hazards from floors and stairways.  ¨ Use grab bars in bathrooms and handrails by stairs.  ¨ Place non-slip mats on floors and in bathtubs.  ¨ Improve lighting in dim areas.  SEEK MEDICAL CARE IF:  · You have increased problems paying attention or  concentrating.  · You have increased difficulty remembering or learning new information.  · You need more time to complete tasks or assignments than before.  · You have increased irritability or decreased ability to cope with stress.  · You have more symptoms than before.  Seek medical care if you have any of the following symptoms for more than 2 weeks after your injury:  · Lasting (chronic) headaches.  · Dizziness or balance problems.  · Nausea.  · Vision problems.  · Increased sensitivity to noise or light.  · Depression or mood swings.  · Anxiety or irritability.  · Memory problems.  · Difficulty concentrating or paying attention.  · Sleep problems.  · Feeling tired all the time.  SEEK IMMEDIATE MEDICAL CARE IF:  · You have severe or worsening headaches. These may be a sign of a blood clot in the brain.  · You have weakness (even if only in one hand, leg, or part of the face).  · You have numbness.  · You have decreased coordination.  · You vomit repeatedly.  · You have increased sleepiness.  · One pupil is larger than the other.  · You have convulsions.  · You have slurred speech.  · You have increased confusion. This may be a sign of a blood clot in the brain.  · You have increased restlessness, agitation, or irritability.  · You are unable to recognize people or places.  · You have neck pain.  · It is difficult to wake you up.  · You have unusual behavior changes.  · You lose consciousness.  MAKE SURE YOU:  · Understand these instructions.  · Will watch your condition.  · Will get help right away if you are not doing well or get worse.  Document Released: 10/10/2003 Document Revised: 07/25/2013 Document Reviewed: 02/09/2013  Exit[employer]® Patient Information ©2015 [employer]. This information is not intended to replace advice given to you by your health care provider. Make sure you discuss any questions you have with your health care provider.

## 2014-09-28 ENCOUNTER — Encounter: Payer: Self-pay | Admitting: *Deleted

## 2014-10-01 ENCOUNTER — Encounter: Payer: Self-pay | Admitting: Internal Medicine

## 2014-10-01 ENCOUNTER — Ambulatory Visit (INDEPENDENT_AMBULATORY_CARE_PROVIDER_SITE_OTHER): Payer: Self-pay | Admitting: Internal Medicine

## 2014-10-01 ENCOUNTER — Encounter: Payer: Self-pay | Admitting: *Deleted

## 2014-10-01 VITALS — BP 102/78 | HR 66 | Ht 70.0 in | Wt 211.0 lb

## 2014-10-01 DIAGNOSIS — I472 Ventricular tachycardia, unspecified: Secondary | ICD-10-CM

## 2014-10-01 DIAGNOSIS — I5042 Chronic combined systolic (congestive) and diastolic (congestive) heart failure: Secondary | ICD-10-CM

## 2014-10-01 DIAGNOSIS — Z4502 Encounter for adjustment and management of automatic implantable cardiac defibrillator: Secondary | ICD-10-CM

## 2014-10-01 DIAGNOSIS — I255 Ischemic cardiomyopathy: Secondary | ICD-10-CM

## 2014-10-01 DIAGNOSIS — N289 Disorder of kidney and ureter, unspecified: Secondary | ICD-10-CM

## 2014-10-01 LAB — MDC_IDC_ENUM_SESS_TYPE_INCLINIC
Battery Remaining Longevity: 130 mo
Battery Voltage: 3.04 V
Brady Statistic RV Percent Paced: 0.03 %
HIGH POWER IMPEDANCE MEASURED VALUE: 0 Ohm
HighPow Impedance: 75 Ohm
Lead Channel Impedance Value: 589 Ohm
Lead Channel Pacing Threshold Amplitude: 0.625 V
Lead Channel Setting Pacing Amplitude: 2 V
Lead Channel Setting Pacing Pulse Width: 0.4 ms
MDC IDC MSMT LEADCHNL RV PACING THRESHOLD PULSEWIDTH: 0.4 ms
MDC IDC MSMT LEADCHNL RV SENSING INTR AMPL: 11.375 mV
MDC IDC MSMT LEADCHNL RV SENSING INTR AMPL: 9.375 mV
MDC IDC SESS DTM: 20160229123818
MDC IDC SET LEADCHNL RV SENSING SENSITIVITY: 0.3 mV
Zone Setting Detection Interval: 240 ms
Zone Setting Detection Interval: 300 ms
Zone Setting Detection Interval: 350 ms

## 2014-10-01 LAB — BASIC METABOLIC PANEL
BUN: 33 mg/dL — AB (ref 6–23)
CHLORIDE: 105 meq/L (ref 96–112)
CO2: 30 mEq/L (ref 19–32)
CREATININE: 1.96 mg/dL — AB (ref 0.40–1.50)
Calcium: 9.5 mg/dL (ref 8.4–10.5)
GFR: 37.51 mL/min — ABNORMAL LOW (ref >60.00)
Glucose, Bld: 90 mg/dL (ref 70–99)
Potassium: 4.6 mEq/L (ref 3.5–5.1)
Sodium: 140 mEq/L (ref 135–145)

## 2014-10-01 MED ORDER — ATORVASTATIN CALCIUM 40 MG PO TABS: 40.0000 mg | ORAL_TABLET | Freq: Every day | ORAL | Status: DC

## 2014-10-01 MED ORDER — ISOSORB DINITRATE-HYDRALAZINE 20-37.5 MG PO TABS
ORAL_TABLET | ORAL | Status: DC
Start: 2014-10-01 — End: 2015-02-25

## 2014-10-01 NOTE — Patient Instructions (Addendum)
Your physician has recommended you make the following change in your medication:  1) START Bidil - take 1/2 tablet twice daily 2) STOP Carvedilol in 3 weeks  Lab today: BMET  Your physician recommends that you schedule a follow-up appointment in: 6 weeks with Ignacia Bayley, NP.  You have been referred to [facility] for renal insufficiency.  Remote monitoring is used to monitor your Pacemaker of ICD from home. This monitoring reduces the number of office visits required to check your device to one time per year. It allows [place] to keep an eye on the functioning of your device to ensure it is working properly. You are scheduled for a device check from home on 01/01/15. You may send your transmission at any time that day. If you have a wireless device, the transmission will be sent automatically. After your physician reviews your transmission, you will receive a postcard with your next transmission date.  Your physician wants you to follow-up in: 1 year with Dr. [name].  You will receive a reminder letter in the mail two months in advance. If you don't receive a letter, please call our office to schedule the follow-up appointment.
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
## 2014-10-12 ENCOUNTER — Encounter: Payer: Self-pay | Admitting: *Deleted

## 2014-10-16 ENCOUNTER — Telehealth: Payer: Self-pay | Admitting: Internal Medicine

## 2014-10-16 NOTE — Telephone Encounter (Signed)
Follow up  Pt returned call for results. States that he has received calls over two weeks for him to call back. Please call

## 2014-10-17 NOTE — Telephone Encounter (Signed)
Patient tells me that his family doctor is following his kidney function which has returned to baseline.  He states that this is not uncommon for his kidney function to go up like this sometimes secondary to dehydration.  He is comfortable with his family doctor following this for now. So no referral at this point. He also states edema is better, so no extra Lasix is needed.

## 2014-10-17 NOTE — Telephone Encounter (Signed)
Attempted to call patient.  All numbers do not accept incoming calls except [phone] - this is the hotel he is currently living at [facility]).  Front clerk there tried to reach patient in his room - number is busy

## 2014-11-14 ENCOUNTER — Ambulatory Visit: Payer: Self-pay | Admitting: Nurse Practitioner

## 2014-11-15 ENCOUNTER — Emergency Department (HOSPITAL_COMMUNITY)
Admission: EM | Admit: 2014-11-15 | Discharge: 2014-11-15 | Disposition: A | Discharge status: Home / Self Care | Payer: Self-pay | Attending: Emergency Medicine | Admitting: Emergency Medicine

## 2014-11-15 ENCOUNTER — Encounter (HOSPITAL_COMMUNITY): Payer: Self-pay | Admitting: *Deleted

## 2014-11-15 DIAGNOSIS — R197 Diarrhea, unspecified: Secondary | ICD-10-CM | POA: Insufficient documentation

## 2014-11-15 DIAGNOSIS — I129 Hypertensive chronic kidney disease with stage 1 through stage 4 chronic kidney disease, or unspecified chronic kidney disease: Secondary | ICD-10-CM | POA: Insufficient documentation

## 2014-11-15 DIAGNOSIS — N183 Chronic kidney disease, stage 3 (moderate): Secondary | ICD-10-CM | POA: Insufficient documentation

## 2014-11-15 DIAGNOSIS — I2510 Atherosclerotic heart disease of native coronary artery without angina pectoris: Secondary | ICD-10-CM | POA: Insufficient documentation

## 2014-11-15 DIAGNOSIS — Z7982 Long term (current) use of aspirin: Secondary | ICD-10-CM | POA: Insufficient documentation

## 2014-11-15 DIAGNOSIS — M1990 Unspecified osteoarthritis, unspecified site: Secondary | ICD-10-CM | POA: Insufficient documentation

## 2014-11-15 DIAGNOSIS — Z87828 Personal history of other (healed) physical injury and trauma: Secondary | ICD-10-CM | POA: Insufficient documentation

## 2014-11-15 DIAGNOSIS — Z79899 Other long term (current) drug therapy: Secondary | ICD-10-CM | POA: Insufficient documentation

## 2014-11-15 DIAGNOSIS — F419 Anxiety disorder, unspecified: Secondary | ICD-10-CM | POA: Insufficient documentation

## 2014-11-15 DIAGNOSIS — Z760 Encounter for issue of repeat prescription: Secondary | ICD-10-CM | POA: Insufficient documentation

## 2014-11-15 DIAGNOSIS — I5042 Chronic combined systolic (congestive) and diastolic (congestive) heart failure: Secondary | ICD-10-CM | POA: Insufficient documentation

## 2014-11-15 DIAGNOSIS — R002 Palpitations: Secondary | ICD-10-CM | POA: Insufficient documentation

## 2014-11-15 DIAGNOSIS — Z95810 Presence of automatic (implantable) cardiac defibrillator: Secondary | ICD-10-CM | POA: Insufficient documentation

## 2014-11-15 DIAGNOSIS — Z872 Personal history of diseases of the skin and subcutaneous tissue: Secondary | ICD-10-CM | POA: Insufficient documentation

## 2014-11-15 DIAGNOSIS — M109 Gout, unspecified: Secondary | ICD-10-CM | POA: Insufficient documentation

## 2014-11-15 DIAGNOSIS — Z8669 Personal history of other diseases of the nervous system and sense organs: Secondary | ICD-10-CM | POA: Insufficient documentation

## 2014-11-15 DIAGNOSIS — E785 Hyperlipidemia, unspecified: Secondary | ICD-10-CM | POA: Insufficient documentation

## 2014-11-15 DIAGNOSIS — Z86718 Personal history of other venous thrombosis and embolism: Secondary | ICD-10-CM | POA: Insufficient documentation

## 2014-11-15 MED ORDER — HYDROCODONE-ACETAMINOPHEN 7.5-325 MG PO TABS: 1.0000 | ORAL_TABLET | Freq: Four times a day (QID) | ORAL | Status: DC | PRN

## 2014-11-15 MED ORDER — CLONAZEPAM 2 MG PO TABS: ORAL_TABLET | ORAL | Status: DC
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
## 2014-11-15 NOTE — ED Notes (Signed)
Pt states he ran out of norco and clonazepam 4 days ago.  States he has been unable to sleep x 3 days.

## 2014-11-15 NOTE — Discharge Instructions (Signed)
Continue her medications. Please follow-up with your primary care doctor.  Benzodiazepine Withdrawal  Benzodiazepines are a group of drugs that are prescribed for both short-term and long-term treatment of a variety of medical conditions. For some of these conditions, such as seizures and sudden and severe muscle spasms, they are used only for a few hours or a few days. For other conditions, such as anxiety, sleep problems, or frequent muscle spasms or to help prevent seizures, they are used for an extended period, usually weeks or months. Benzodiazepines work by changing the way your brain functions. Normally, chemicals in your brain called neurotransmitters send messages between your brain cells. The neurotransmitter that benzodiazepines affect is called gamma-aminobutyric acid (GABA). GABA sends out messages that have a calming effect on many of the functions of your brain. Benzodiazepines make these messages stronger and increase this calming effect. Short-term use of benzodiazepines usually does not cause problems when you stop taking the drugs. However, if you take benzodiazepines for a long time, your body can adjust to the drug and require more of it to produce the same effect (drug tolerance). Eventually, you can develop physical dependence on benzodiazepines, which is when you experience negative effects if your dosage of benzodiazepines is reduced or stopped too quickly. These negative effects are called symptoms of withdrawal. SYMPTOMS Symptoms of withdrawal may begin anytime within the first 10 days after you stop taking the benzodiazepine. They can last from several weeks up to a few months but usually are the worst between the first 10 to 14 days.  The actual symptoms also vary, depending on the type of benzodiazepine you take. Possible symptoms include:  Anxiety.  Excitability.  Irritability.  Depression.  Mood swings.  Trouble sleeping.  Confusion.  Uncontrollable shaking  (tremors).  Muscle weakness.  Seizures. DIAGNOSIS To diagnose benzodiazepine withdrawal, your caregiver will examine you for certain signs, such as:  Rapid heartbeat.  Rapid breathing.  Tremors.  High blood pressure.  Fever.  Mood changes. Your caregiver also may ask the following questions about your use of benzodiazepines:  What type of benzodiazepine did you take?  How much did you take each day?  How long did you take the drug?  When was the last time you took the drug?  Do you take any other drugs?  Have you had alcohol recently?  Have you had a seizure recently?  Have you lost consciousness recently?  Have you had trouble remembering recent events?  Have you had a recent increase in anxiety, irritability, or trouble sleeping? A drug test also may be administered. TREATMENT The treatment for benzodiazepine withdrawal can vary, depending on the type and severity of your symptoms, what type of benzodiazepine you have been taking, and how long you have been taking the benzodiazepine. Sometimes it is necessary for you to be treated in a hospital, especially if you are at risk of seizures.  Often, treatment includes a prescription for a long-acting benzodiazepine, the dosage of which is reduced slowly over a long period. This period could be several weeks or months. Eventually, your dosage will be reduced to a point that you can stop taking the drug, without experiencing withdrawal symptoms. This is called tapered withdrawal. Occasionally, minor symptoms of withdrawal continue for a few days or weeks after you have completed a tapered withdrawal. SEEK IMMEDIATE MEDICAL CARE IF:  You have a seizure.  You develop a craving for drugs or alcohol.  You begin to experience symptoms of withdrawal during your tapered withdrawal.  You become very confused.  You lose consciousness.  You have trouble breathing.  You think about hurting yourself or someone  else. Document Released: 07/09/2011 Document Revised: 10/12/2011 Document Reviewed: 07/09/2011 [facility] Patient Information 2015 [place], [place]. This information is not intended to replace advice given to you by your health care provider. Make sure you discuss any questions you have with your health care provider.

## 2014-11-15 NOTE — ED Notes (Signed)
Declined W/C at D/C and was escorted to lobby by RN. 

## 2014-11-15 NOTE — ED Notes (Signed)
Needs n=meds refilled

## 2014-11-25 ENCOUNTER — Encounter (HOSPITAL_COMMUNITY): Payer: Self-pay | Admitting: Nurse Practitioner

## 2014-11-25 ENCOUNTER — Emergency Department (HOSPITAL_COMMUNITY)
Admission: EM | Admit: 2014-11-25 | Discharge: 2014-11-25 | Disposition: A | Discharge status: Home / Self Care | Payer: Self-pay | Attending: Emergency Medicine | Admitting: Emergency Medicine

## 2014-11-25 DIAGNOSIS — E785 Hyperlipidemia, unspecified: Secondary | ICD-10-CM | POA: Insufficient documentation

## 2014-11-25 DIAGNOSIS — N183 Chronic kidney disease, stage 3 (moderate): Secondary | ICD-10-CM | POA: Insufficient documentation

## 2014-11-25 DIAGNOSIS — Z95810 Presence of automatic (implantable) cardiac defibrillator: Secondary | ICD-10-CM | POA: Insufficient documentation

## 2014-11-25 DIAGNOSIS — M069 Rheumatoid arthritis, unspecified: Secondary | ICD-10-CM | POA: Insufficient documentation

## 2014-11-25 DIAGNOSIS — Z79899 Other long term (current) drug therapy: Secondary | ICD-10-CM | POA: Insufficient documentation

## 2014-11-25 DIAGNOSIS — I5042 Chronic combined systolic (congestive) and diastolic (congestive) heart failure: Secondary | ICD-10-CM | POA: Insufficient documentation

## 2014-11-25 DIAGNOSIS — Z951 Presence of aortocoronary bypass graft: Secondary | ICD-10-CM | POA: Insufficient documentation

## 2014-11-25 DIAGNOSIS — M109 Gout, unspecified: Secondary | ICD-10-CM | POA: Insufficient documentation

## 2014-11-25 DIAGNOSIS — Z9889 Other specified postprocedural states: Secondary | ICD-10-CM | POA: Insufficient documentation

## 2014-11-25 DIAGNOSIS — Z9861 Coronary angioplasty status: Secondary | ICD-10-CM | POA: Insufficient documentation

## 2014-11-25 DIAGNOSIS — Z87828 Personal history of other (healed) physical injury and trauma: Secondary | ICD-10-CM | POA: Insufficient documentation

## 2014-11-25 DIAGNOSIS — Z86718 Personal history of other venous thrombosis and embolism: Secondary | ICD-10-CM | POA: Insufficient documentation

## 2014-11-25 DIAGNOSIS — Z872 Personal history of diseases of the skin and subcutaneous tissue: Secondary | ICD-10-CM | POA: Insufficient documentation

## 2014-11-25 DIAGNOSIS — I129 Hypertensive chronic kidney disease with stage 1 through stage 4 chronic kidney disease, or unspecified chronic kidney disease: Secondary | ICD-10-CM | POA: Insufficient documentation

## 2014-11-25 DIAGNOSIS — F419 Anxiety disorder, unspecified: Secondary | ICD-10-CM | POA: Insufficient documentation

## 2014-11-25 DIAGNOSIS — I2510 Atherosclerotic heart disease of native coronary artery without angina pectoris: Secondary | ICD-10-CM | POA: Insufficient documentation

## 2014-11-25 DIAGNOSIS — Z8669 Personal history of other diseases of the nervous system and sense organs: Secondary | ICD-10-CM | POA: Insufficient documentation

## 2014-11-25 DIAGNOSIS — Z7982 Long term (current) use of aspirin: Secondary | ICD-10-CM | POA: Insufficient documentation

## 2014-11-25 MED ORDER — PREDNISONE 20 MG PO TABS
60.0000 mg | ORAL_TABLET | Freq: Once | ORAL | Status: AC
  Administered 2014-11-25: 60 mg via ORAL
  Filled 2014-11-25: qty 3

## 2014-11-25 MED ORDER — PREDNISONE 20 MG PO TABS: ORAL_TABLET | ORAL | Status: DC

## 2014-11-25 NOTE — ED Notes (Signed)
He reports red, painful R foot and great toe since Friday. He has history of gout and feels like gout. He has not taken his gout maintenance medication this week.

## 2014-11-25 NOTE — ED Notes (Signed)
Declined W/C at D/C and was escorted to lobby by RN. 

## 2014-11-25 NOTE — Discharge Instructions (Signed)
Use prednisone as directed. Use tylenol or your home pain medication for additional pain relief. Ice and elevate for additional relief. Call your regular doctor tomorrow to get your refill of allopurinol, and for recheck of symptoms in the next 3-5 days. Return to the ER for changes or worsening symptoms.   Gout Gout is when your joints become red, sore, and swell (inflamed). This is caused by the buildup of uric acid crystals in the joints. Uric acid is a chemical that is normally in the blood. If the level of uric acid gets too high in the blood, these crystals form in your joints and tissues. Over time, these crystals can form into masses near the joints and tissues. These masses can destroy bone and cause the bone to look misshapen (deformed). HOME CARE   Do not take aspirin for pain.  Only take medicine as told by your doctor.  Rest the joint as much as you can. When in bed, keep sheets and blankets off painful areas.  Keep the sore joints raised (elevated).  Put warm or cold packs on painful joints. Use of warm or cold packs depends on which works best for you.  Use crutches if the painful joint is in your leg.  Drink enough fluids to keep your pee (urine) clear or pale yellow. Limit alcohol, sugary drinks, and drinks with fructose in them.  Follow your diet instructions. Pay careful attention to how much protein you eat. Include fruits, vegetables, whole grains, and fat-free or low-fat milk products in your daily diet. Talk to your doctor or dietitian about the use of coffee, vitamin C, and cherries. These may help lower uric acid levels.  Keep a healthy body weight. GET HELP RIGHT AWAY IF:   You have watery poop (diarrhea), throw up (vomit), or have any side effects from medicines.  You do not feel better in 24 hours, or you are getting worse.  Your joint becomes suddenly more tender, and you have chills or a fever. MAKE SURE YOU:   Understand these instructions.  Will  watch your condition.  Will get help right away if you are not doing well or get worse. Document Released: 04/28/2008 Document Revised: 12/04/2013 Document Reviewed: 03/02/2012 [facility] Patient Information 2015 [place], [place]. This information is not intended to replace advice given to you by your health care provider. Make sure you discuss any questions you have with your health care provider.  Low-Purine Diet Purines are compounds that affect the level of uric acid in your body. A low-purine diet is a diet that is low in purines. Eating a low-purine diet can prevent the level of uric acid in your body from getting too high and causing gout or kidney stones or both. WHAT DO I NEED TO KNOW ABOUT THIS DIET?  Choose low-purine foods. Examples of low-purine foods are listed in the next section.  Drink plenty of fluids, especially water. Fluids can help remove uric acid from your body. Try to drink 8-16 cups (1.9-3.8 L) a day.  Limit foods high in fat, especially saturated fat, as fat makes it harder for the body to get rid of uric acid. Foods high in saturated fat include pizza, cheese, ice cream, whole milk, fried foods, and gravies. Choose foods that are lower in fat and lean sources of protein. Use olive oil when cooking as it contains healthy fats that are not high in saturated fat.  Limit alcohol. Alcohol interferes with the elimination of uric acid from your body. If  you are having a gout attack, avoid all alcohol.  Keep in mind that different people's bodies react differently to different foods. You will probably learn over time which foods do or do not affect you. If you discover that a food tends to cause your gout to flare up, avoid eating that food. You can more freely enjoy foods that do not cause problems. If you have any questions about a food item, talk to your dietitian or health care provider. WHICH FOODS ARE LOW, MODERATE, AND HIGH IN PURINES? The following is a list of foods that  are low, moderate, and high in purines. You can eat any amount of the foods that are low in purines. You may be able to have small amounts of foods that are moderate in purines. Ask your health care provider how much of a food moderate in purines you can have. Avoid foods high in purines. Grains  Foods low in purines: Enriched white bread, pasta, rice, cake, cornbread, popcorn.  Foods moderate in purines: Whole-grain breads and cereals, wheat germ, bran, oatmeal. Uncooked oatmeal. Dry wheat bran or wheat germ.  Foods high in purines: Pancakes, [place] toast, biscuits, muffins. Vegetables  Foods low in purines: All vegetables, except those that are moderate in purines.  Foods moderate in purines: Asparagus, cauliflower, spinach, mushrooms, green peas. Fruits  All fruits are low in purines. Meats and other Protein Foods  Foods low in purines: Eggs, nuts, peanut butter.  Foods moderate in purines: 80-90% lean beef, lamb, veal, pork, poultry, fish, eggs, peanut butter, nuts. Crab, lobster, oysters, and shrimp. Cooked dried beans, peas, and lentils.  Foods high in purines: Anchovies, sardines, herring, mussels, tuna, codfish, scallops, trout, and haddock. Berniece Salines. Organ meats (such as liver or kidney). Tripe. Game meat. Goose. Sweetbreads. Dairy  All dairy foods are low in purines. Low-fat and fat-free dairy products are best because they are low in saturated fat. Beverages  Drinks low in purines: Water, carbonated beverages, tea, coffee, cocoa.  Drinks moderate in purines: Soft drinks and other drinks sweetened with high-fructose corn syrup. Juices. To find whether a food or drink is sweetened with high-fructose corn syrup, look at the ingredients list.  Drinks high in purines: Alcoholic beverages (such as beer). Condiments  Foods low in purines: Salt, herbs, olives, pickles, relishes, vinegar.  Foods moderate in purines: Butter, margarine, oils, mayonnaise. Fats and Oils  Foods low  in purines: All types, except gravies and sauces made with meat.  Foods high in purines: Gravies and sauces made with meat. Other Foods  Foods low in purines: Sugars, sweets, gelatin. Cake. Soups made without meat.  Foods moderate in purines: Meat-based or fish-based soups, broths, or bouillons. Foods and drinks sweetened with high-fructose corn syrup.  Foods high in purines: High-fat desserts (such as ice cream, cookies, cakes, pies, doughnuts, and chocolate). Contact your dietitian for more information on foods that are not listed here. Document Released: 11/14/2010 Document Revised: 07/25/2013 Document Reviewed: 06/26/2013 [facility] Patient Information 2015 [place], [place]. This information is not intended to replace advice given to you by your health care provider. Make sure you discuss any questions you have with your health care provider.
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
## 2015-01-01 ENCOUNTER — Telehealth: Payer: Self-pay | Admitting: Cardiology

## 2015-01-01 ENCOUNTER — Encounter: Payer: Self-pay | Admitting: *Deleted

## 2015-01-01 NOTE — Telephone Encounter (Signed)
LMOVM reminding pt to send remote transmission.   

## 2015-01-04 ENCOUNTER — Encounter: Payer: Self-pay | Admitting: Cardiology

## 2015-01-07 ENCOUNTER — Telehealth: Payer: Self-pay | Admitting: Family Medicine

## 2015-01-07 MED ORDER — HYDROCODONE-ACETAMINOPHEN 7.5-325 MG PO TABS: 1.0000 | ORAL_TABLET | Freq: Four times a day (QID) | ORAL | Status: DC | PRN

## 2015-01-07 NOTE — Telephone Encounter (Signed)
Pt contacted office for refill request on the following medications: Hydrocodone 7.5-325 mg. Pt stated he will run out of medication 01/10/15 but has been in a lot of pain and has had to take more medication then it is written for. Pt stated he has had trouble sleeping because of the pain. Pt stated that maybe it is time to see pain management. Pt is off Tuesday and Wednesday and would like to pick up the RX then. Thanks TNP

## 2015-01-21 ENCOUNTER — Other Ambulatory Visit: Payer: Self-pay | Admitting: Family Medicine

## 2015-01-21 MED ORDER — HYDROCODONE-ACETAMINOPHEN 10-325 MG PO TABS
1.0000 | ORAL_TABLET | Freq: Four times a day (QID) | ORAL | Status: DC | PRN
Start: 2015-01-21 — End: 2015-01-22

## 2015-01-21 NOTE — Telephone Encounter (Signed)
Can try hydrocodone.apap 10/325 to get by until seen at pain clinic. Can pick up rx at front desk.

## 2015-01-21 NOTE — Telephone Encounter (Signed)
Pt stated he doesn't go to the pain clinic for a few weeks and he is really hurting and the Hydrocodone 7.5-325 are not helping. Pt stated he wasn't able to work yesterday because of the pain. Pt would like to know if he can get a stronger pain medication or if you can work him into your schedule for an OV tomorrow 01/22/15. Please advised. Thanks TNP

## 2015-01-21 NOTE — Telephone Encounter (Signed)
Patient notified

## 2015-01-22 ENCOUNTER — Other Ambulatory Visit: Payer: Self-pay | Admitting: Family Medicine

## 2015-01-22 MED ORDER — HYDROCODONE-ACETAMINOPHEN 10-325 MG PO TABS: 1.0000 | ORAL_TABLET | Freq: Four times a day (QID) | ORAL | Status: DC | PRN

## 2015-02-08 ENCOUNTER — Telehealth: Payer: Self-pay | Admitting: Family Medicine

## 2015-02-08 MED ORDER — PREDNISONE 10 MG PO TABS: ORAL_TABLET | ORAL | Status: DC

## 2015-02-08 NOTE — Telephone Encounter (Signed)
6 day prednisone taper sent to [facility] [address]

## 2015-02-08 NOTE — Telephone Encounter (Signed)
Pt contacted office for refill request on the following medications: Prednisone to [facility] on [address] in [place]. Pt stated he is in a lot of pain and the pain meds are not helping and he can not miss any more work. Thanks TNP

## 2015-02-11 ENCOUNTER — Telehealth: Payer: Self-pay | Admitting: Family Medicine

## 2015-02-12 ENCOUNTER — Other Ambulatory Visit: Payer: Self-pay | Admitting: Family Medicine

## 2015-02-12 MED ORDER — HYDROCODONE-ACETAMINOPHEN 7.5-325 MG PO TABS: 1.0000 | ORAL_TABLET | Freq: Four times a day (QID) | ORAL | Status: DC | PRN

## 2015-02-12 NOTE — Telephone Encounter (Signed)
Pt contacted office for refill request on the following medications:  HYDROcodone-acetaminophen ([place]) 10-325 MG.  Pt is requesting this changed to 7.5-325 if possible.  [id number]

## 2015-02-14 ENCOUNTER — Other Ambulatory Visit: Payer: Self-pay | Admitting: Family Medicine

## 2015-02-14 MED ORDER — PREDNISONE 10 MG PO TABS: ORAL_TABLET | ORAL | Status: AC

## 2015-02-14 NOTE — Telephone Encounter (Signed)
Patient wants to know if can call in another round of predniSONE (DELTASONE) 10 MG tablet  [facility] [address] [place])

## 2015-02-22 ENCOUNTER — Inpatient Hospital Stay (HOSPITAL_COMMUNITY)
Admission: EM | Admit: 2015-02-22 | Discharge: 2015-02-25 | DRG: 872 | Disposition: A | Discharge status: Home / Self Care | Payer: Self-pay | Attending: Internal Medicine | Admitting: Internal Medicine

## 2015-02-22 ENCOUNTER — Encounter (HOSPITAL_COMMUNITY): Payer: Self-pay | Admitting: Family Medicine

## 2015-02-22 DIAGNOSIS — I255 Ischemic cardiomyopathy: Secondary | ICD-10-CM | POA: Diagnosis present

## 2015-02-22 DIAGNOSIS — Z955 Presence of coronary angioplasty implant and graft: Secondary | ICD-10-CM

## 2015-02-22 DIAGNOSIS — E7800 Pure hypercholesterolemia, unspecified: Secondary | ICD-10-CM | POA: Diagnosis present

## 2015-02-22 DIAGNOSIS — I959 Hypotension, unspecified: Secondary | ICD-10-CM | POA: Diagnosis present

## 2015-02-22 DIAGNOSIS — L03116 Cellulitis of left lower limb: Secondary | ICD-10-CM | POA: Diagnosis present

## 2015-02-22 DIAGNOSIS — I129 Hypertensive chronic kidney disease with stage 1 through stage 4 chronic kidney disease, or unspecified chronic kidney disease: Secondary | ICD-10-CM | POA: Diagnosis present

## 2015-02-22 DIAGNOSIS — L02419 Cutaneous abscess of limb, unspecified: Secondary | ICD-10-CM | POA: Diagnosis present

## 2015-02-22 DIAGNOSIS — Z95810 Presence of automatic (implantable) cardiac defibrillator: Secondary | ICD-10-CM

## 2015-02-22 DIAGNOSIS — N183 Chronic kidney disease, stage 3 (moderate): Secondary | ICD-10-CM | POA: Diagnosis present

## 2015-02-22 DIAGNOSIS — I2510 Atherosclerotic heart disease of native coronary artery without angina pectoris: Secondary | ICD-10-CM | POA: Diagnosis present

## 2015-02-22 DIAGNOSIS — E785 Hyperlipidemia, unspecified: Secondary | ICD-10-CM | POA: Diagnosis present

## 2015-02-22 DIAGNOSIS — I252 Old myocardial infarction: Secondary | ICD-10-CM

## 2015-02-22 DIAGNOSIS — A419 Sepsis, unspecified organism: Secondary | ICD-10-CM

## 2015-02-22 DIAGNOSIS — B9689 Other specified bacterial agents as the cause of diseases classified elsewhere: Secondary | ICD-10-CM

## 2015-02-22 DIAGNOSIS — I5032 Chronic diastolic (congestive) heart failure: Secondary | ICD-10-CM

## 2015-02-22 DIAGNOSIS — L03119 Cellulitis of unspecified part of limb: Secondary | ICD-10-CM

## 2015-02-22 DIAGNOSIS — M069 Rheumatoid arthritis, unspecified: Secondary | ICD-10-CM | POA: Diagnosis present

## 2015-02-22 DIAGNOSIS — F419 Anxiety disorder, unspecified: Secondary | ICD-10-CM | POA: Diagnosis present

## 2015-02-22 DIAGNOSIS — K219 Gastro-esophageal reflux disease without esophagitis: Secondary | ICD-10-CM | POA: Diagnosis present

## 2015-02-22 DIAGNOSIS — Z951 Presence of aortocoronary bypass graft: Secondary | ICD-10-CM

## 2015-02-22 DIAGNOSIS — Z8249 Family history of ischemic heart disease and other diseases of the circulatory system: Secondary | ICD-10-CM

## 2015-02-22 DIAGNOSIS — M549 Dorsalgia, unspecified: Secondary | ICD-10-CM

## 2015-02-22 HISTORY — DX: Sepsis, unspecified organism: A41.9

## 2015-02-22 LAB — CBC WITH DIFFERENTIAL/PLATELET
BASOS PCT: 0 % (ref 0–1)
Basophils Absolute: 0 10*3/uL (ref 0.0–0.1)
EOS PCT: 0 % (ref 0–5)
Eosinophils Absolute: 0.1 10*3/uL (ref 0.0–0.7)
HCT: 43.4 % (ref 39.0–52.0)
Hemoglobin: 14.3 g/dL (ref 13.0–17.0)
LYMPHS ABS: 1.3 10*3/uL (ref 0.7–4.0)
LYMPHS PCT: 6 % — AB (ref 12–46)
MCH: 31.6 pg (ref 26.0–34.0)
MCHC: 32.9 g/dL (ref 30.0–36.0)
MCV: 96 fL (ref 78.0–100.0)
MONO ABS: 1.1 10*3/uL — AB (ref 0.1–1.0)
Monocytes Relative: 5 % (ref 3–12)
NEUTROS ABS: 19.2 10*3/uL — AB (ref 1.7–7.7)
Neutrophils Relative %: 89 % — ABNORMAL HIGH (ref 43–77)
Platelets: 128 10*3/uL — ABNORMAL LOW (ref 150–400)
RBC: 4.52 MIL/uL (ref 4.22–5.81)
RDW: 15.8 % — ABNORMAL HIGH (ref 11.5–15.5)
WBC: 21.6 10*3/uL — ABNORMAL HIGH (ref 4.0–10.5)

## 2015-02-22 LAB — LACTIC ACID, PLASMA: Lactic Acid, Venous: 1 mmol/L (ref 0.5–2.0)

## 2015-02-22 LAB — CREATININE, SERUM
Creatinine, Ser: 2.1 mg/dL — ABNORMAL HIGH (ref 0.61–1.24)
GFR, EST AFRICAN AMERICAN: 38 mL/min — AB (ref >60)
GFR, EST NON AFRICAN AMERICAN: 33 mL/min — AB (ref >60)

## 2015-02-22 LAB — BASIC METABOLIC PANEL
ANION GAP: 12 (ref 5–15)
BUN: 39 mg/dL — AB (ref 6–20)
CALCIUM: 8.5 mg/dL — AB (ref 8.9–10.3)
CHLORIDE: 101 mmol/L (ref 101–111)
CO2: 22 mmol/L (ref 22–32)
CREATININE: 2.08 mg/dL — AB (ref 0.61–1.24)
GFR calc Af Amer: 39 mL/min — ABNORMAL LOW (ref >60)
GFR calc non Af Amer: 33 mL/min — ABNORMAL LOW (ref >60)
Glucose, Bld: 105 mg/dL — ABNORMAL HIGH (ref 65–99)
POTASSIUM: 3.8 mmol/L (ref 3.5–5.1)
Sodium: 135 mmol/L (ref 135–145)

## 2015-02-22 LAB — PROCALCITONIN: Procalcitonin: 3.32 ng/mL

## 2015-02-22 LAB — I-STAT CG4 LACTIC ACID, ED
LACTIC ACID, VENOUS: 1.52 mmol/L (ref 0.5–2.0)
Lactic Acid, Venous: 0.66 mmol/L (ref 0.5–2.0)

## 2015-02-22 LAB — APTT: APTT: 29 s (ref 24–37)

## 2015-02-22 LAB — PROTIME-INR
INR: 1.21 (ref 0.00–1.49)
Prothrombin Time: 15.5 seconds — ABNORMAL HIGH (ref 11.6–15.2)

## 2015-02-22 MED ORDER — MORPHINE SULFATE 2 MG/ML IJ SOLN
1.0000 mg | INTRAMUSCULAR | Status: DC | PRN
  Administered 2015-02-22 – 2015-02-24 (×7): 1 mg via INTRAVENOUS
  Filled 2015-02-22 (×7): qty 1

## 2015-02-22 MED ORDER — SODIUM CHLORIDE 0.9 % IV SOLN
INTRAVENOUS | Status: AC
  Administered 2015-02-22: 800 mL via INTRAVENOUS
  Administered 2015-02-23: 04:00:00 via INTRAVENOUS

## 2015-02-22 MED ORDER — PIPERACILLIN-TAZOBACTAM 3.375 G IVPB
3.3750 g | Freq: Three times a day (TID) | INTRAVENOUS | Status: DC
  Administered 2015-02-23 – 2015-02-24 (×5): 3.375 g via INTRAVENOUS
  Filled 2015-02-22 (×8): qty 50

## 2015-02-22 MED ORDER — ASPIRIN EC 81 MG PO TBEC: 81.0000 mg | DELAYED_RELEASE_TABLET | Freq: Every day | ORAL | Status: DC

## 2015-02-22 MED ORDER — ONDANSETRON HCL 4 MG PO TABS: 4.0000 mg | ORAL_TABLET | Freq: Four times a day (QID) | ORAL | Status: DC | PRN

## 2015-02-22 MED ORDER — VANCOMYCIN HCL 10 G IV SOLR
1250.0000 mg | INTRAVENOUS | Status: DC
  Administered 2015-02-23 – 2015-02-24 (×2): 1250 mg via INTRAVENOUS
  Filled 2015-02-22 (×2): qty 1250

## 2015-02-22 MED ORDER — ENOXAPARIN SODIUM 40 MG/0.4ML ~~LOC~~ SOLN
40.0000 mg | SUBCUTANEOUS | Status: DC
Start: 2015-02-23 — End: 2015-02-25
  Administered 2015-02-23 – 2015-02-24 (×2): 40 mg via SUBCUTANEOUS
  Filled 2015-02-22 (×3): qty 0.4

## 2015-02-22 MED ORDER — VANCOMYCIN HCL 10 G IV SOLR
1750.0000 mg | Freq: Once | INTRAVENOUS | Status: AC
  Administered 2015-02-22: 1750 mg via INTRAVENOUS
  Filled 2015-02-22: qty 1750

## 2015-02-22 MED ORDER — HYDROCODONE-ACETAMINOPHEN 7.5-325 MG PO TABS
1.0000 | ORAL_TABLET | Freq: Four times a day (QID) | ORAL | Status: DC | PRN
  Administered 2015-02-23 – 2015-02-25 (×4): 1 via ORAL
  Filled 2015-02-22 (×4): qty 1

## 2015-02-22 MED ORDER — ALUM & MAG HYDROXIDE-SIMETH 200-200-20 MG/5ML PO SUSP: 30.0000 mL | Freq: Four times a day (QID) | ORAL | Status: DC | PRN

## 2015-02-22 MED ORDER — ASPIRIN EC 81 MG PO TBEC
81.0000 mg | DELAYED_RELEASE_TABLET | Freq: Every day | ORAL | Status: DC
  Administered 2015-02-23 – 2015-02-25 (×3): 81 mg via ORAL
  Filled 2015-02-22 (×3): qty 1

## 2015-02-22 MED ORDER — ACETAMINOPHEN 325 MG PO TABS
650.0000 mg | ORAL_TABLET | Freq: Four times a day (QID) | ORAL | Status: DC | PRN
  Administered 2015-02-24: 650 mg via ORAL
  Filled 2015-02-22 (×2): qty 2

## 2015-02-22 MED ORDER — CLONAZEPAM 0.5 MG PO TABS
0.5000 mg | ORAL_TABLET | Freq: Three times a day (TID) | ORAL | Status: DC | PRN
  Administered 2015-02-24 (×2): 0.5 mg via ORAL
  Filled 2015-02-22 (×2): qty 1

## 2015-02-22 MED ORDER — ONDANSETRON HCL 4 MG/2ML IJ SOLN: 4.0000 mg | Freq: Four times a day (QID) | INTRAMUSCULAR | Status: DC | PRN

## 2015-02-22 MED ORDER — PIPERACILLIN-TAZOBACTAM 3.375 G IVPB 30 MIN
3.3750 g | Freq: Once | INTRAVENOUS | Status: AC
  Administered 2015-02-22: 3.375 g via INTRAVENOUS
  Filled 2015-02-22: qty 50

## 2015-02-22 MED ORDER — SODIUM CHLORIDE 0.9 % IV BOLUS (SEPSIS)
500.0000 mL | Freq: Once | INTRAVENOUS | Status: AC
  Administered 2015-02-22: 500 mL via INTRAVENOUS

## 2015-02-22 NOTE — ED Notes (Signed)
Pt here for redness to right leg sts worsening this am. Hx of cellulitis in that leg.

## 2015-02-22 NOTE — ED Notes (Signed)
Pt stated he had unknown amount of money in pants pocket; Money was not removed or touched by staff; RN on [address] made aware of money in the presence of patient.
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
## 2015-02-22 NOTE — ED Notes (Signed)
Admitting Md at bedside
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
## 2015-02-23 DIAGNOSIS — E780 Pure hypercholesterolemia: Secondary | ICD-10-CM

## 2015-02-23 DIAGNOSIS — L03119 Cellulitis of unspecified part of limb: Secondary | ICD-10-CM

## 2015-02-23 DIAGNOSIS — A419 Sepsis, unspecified organism: Principal | ICD-10-CM

## 2015-02-23 DIAGNOSIS — L02419 Cutaneous abscess of limb, unspecified: Secondary | ICD-10-CM

## 2015-02-23 DIAGNOSIS — N183 Chronic kidney disease, stage 3 (moderate): Secondary | ICD-10-CM

## 2015-02-23 LAB — CBC
HEMATOCRIT: 39.6 % (ref 39.0–52.0)
HEMATOCRIT: 42.8 % (ref 39.0–52.0)
HEMOGLOBIN: 13.9 g/dL (ref 13.0–17.0)
Hemoglobin: 12.7 g/dL — ABNORMAL LOW (ref 13.0–17.0)
MCH: 31.4 pg (ref 26.0–34.0)
MCH: 32.1 pg (ref 26.0–34.0)
MCHC: 32.5 g/dL (ref 30.0–36.0)
MCHC: 32.1 g/dL (ref 30.0–36.0)
MCV: 98.8 fL (ref 78.0–100.0)
MCV: 98 fL (ref 78.0–100.0)
PLATELETS: 97 10*3/uL — AB (ref 150–400)
Platelets: 107 10*3/uL — ABNORMAL LOW (ref 150–400)
RBC: 4.04 MIL/uL — AB (ref 4.22–5.81)
RBC: 4.33 MIL/uL (ref 4.22–5.81)
RDW: 16 % — ABNORMAL HIGH (ref 11.5–15.5)
RDW: 16 % — ABNORMAL HIGH (ref 11.5–15.5)
WBC: 17.2 10*3/uL — ABNORMAL HIGH (ref 4.0–10.5)
WBC: 14.7 10*3/uL — ABNORMAL HIGH (ref 4.0–10.5)

## 2015-02-23 LAB — BASIC METABOLIC PANEL
Anion gap: 10 (ref 5–15)
BUN: 35 mg/dL — ABNORMAL HIGH (ref 6–20)
CALCIUM: 7.8 mg/dL — AB (ref 8.9–10.3)
CO2: 22 mmol/L (ref 22–32)
Chloride: 106 mmol/L (ref 101–111)
Creatinine, Ser: 1.99 mg/dL — ABNORMAL HIGH (ref 0.61–1.24)
GFR calc non Af Amer: 35 mL/min — ABNORMAL LOW (ref >60)
GFR, EST AFRICAN AMERICAN: 41 mL/min — AB (ref >60)
GLUCOSE: 103 mg/dL — AB (ref 65–99)
Potassium: 3.5 mmol/L (ref 3.5–5.1)
SODIUM: 138 mmol/L (ref 135–145)

## 2015-02-23 LAB — CLOSTRIDIUM DIFFICILE BY PCR: CDIFFPCR: NEGATIVE

## 2015-02-23 LAB — CORTISOL-AM, BLOOD: CORTISOL - AM: 12.5 ug/dL (ref 6.7–22.6)

## 2015-02-23 LAB — MRSA PCR SCREENING: MRSA by PCR: NEGATIVE

## 2015-02-23 NOTE — Progress Notes (Signed)
PROGRESS NOTE  Richard Cook [id number] DOB: 12-19-1955 DOA: 02/22/2015 PCP: Lelon Huh, MD Brief history 59yo man with PMH of CAD s/p CABG with ICD in place, HLD, HTN, CKD, CHF with EF of 30-35% and grade 1 diastolic dysfunction, gout, anxiety, CKD who presents for 1 day history of acute pain, redness and rash on his left lower extremity. Patient states that he has had a recurrent cellulitis of his left lower extremity in the past, but has not had to be hospitalized nor has he been on any antibiotics for his leg for 4 years.   He denies any hardware in his LLE.  He denies any recent injuries or trauma or sick contacts. Upon evaluation in the emergency department, the patient was found to be septic with WBC 21.6, lactic acid 1.5, procalcitonin 3.32 with hypotension. The patient was fluid resuscitated and started on intravenous antibiotics after blood cultures were obtained.  Assessment/Plan: Sepsis due to cellulitis of the leg with hypotension - Left leg cellulitis present, reported by patient to be recurrent - continue vanco and zosyn pending culture data - BC X 2 drawn - Lactic acid normal - Keep in SDU today - Monitor fluid status closely given CHF - IVF with NS at 125cc/hr-->saline lock after present liter is done - WBC very elevated, but he was also recently on steroids, this could be contributing (finished prednisone on 7/21) - AM cortisol and ACTH--not reliable as pt was on steroids prior 2 weeks - Telemetry - Orthostatics reported as negative in the ED  CAD s/p CABG in 5/14, CHF with EF of 30-35%, ICD in place - Per Dr. Olin Pia last note, patient is supposed to be on coreg and low dose bidil, he was also noted to have somewhat low blood pressures at that time, so his current hypotension could be chronic (102/78 then) - Monitor respiratory status closely given fluids - Holding coreg and lasix in acute setting, restart as indicated - Strict I/Os, daily weight    Hypercholesterolemia - Reported not taking statin, will continue to hold while inpatient - Defer to outpatient management   Chronic renal impairment, stage 3  - Cr. 2.08 at time of admit - baseline creatinine 1.7-2.0 - BMET in the AM  Back pain, reported history of RA - Recent steroid use, he reports for only 2 weeks - Will check ACTH and AM cortisol, if BP worsens or does not respond to fluids, consider stress dose steroids.  - Pain control with hydrocodone/apap and morphine with hold parameters given low BP.  Diarrhea -stool for C diff PCR  Family Communication:   Pt at beside Disposition Plan:   Home when medically stable       Subjective:  patient states that the pain in his left lower extremity is about 50% better than the previous day. He denies any nausea, vomiting, diarrhea, abdominal pain, dysuria, hematuria. Denies any headache or visual disturbance. Denies any coughing or hemoptysis.  Objective: Filed Vitals:   02/23/15 0200 02/23/15 0331 02/23/15 0802 02/23/15 0834  BP: 124/75 125/100 143/82   Pulse: 68 76 83   Temp:  98.3 F (36.8 C) 99.2 F (37.3 C) 101.7 F (38.7 C)  TempSrc:  Oral  Axillary  Resp: 19 23 21    Height:      Weight:      SpO2:  100% 96%     Intake/Output Summary (Last 24 hours) at 02/23/15 1113 Last data filed at 02/23/15  0800  Gross per 24 hour  Intake   1050 ml  Output    452 ml  Net    598 ml   Weight change:  Exam:   General:  Pt is alert, follows commands appropriately, not in acute distress  HEENT: No icterus, No thrush, No neck mass, [place]/AT  Cardiovascular: RRR, S1/S2, no rubs, no gallops  Respiratory: CTA bilaterally, no wheezing, no crackles, no rhonchi  Abdomen: Soft/+BS, non tender, non distended, no guarding; no hepatosplenomegaly  Extremities: left lower extremity edema from the patient's ankle to the infrapatellar area with blanching and nonblanching components. No crepitance or necrosis. No draining  wounds.  Data Reviewed: Basic Metabolic Panel:  Recent Labs Lab 02/22/15 1735 02/22/15 2142 02/23/15 0228  NA 135  --  138  K 3.8  --  3.5  CL 101  --  106  CO2 22  --  22  GLUCOSE 105*  --  103*  BUN 39*  --  35*  CREATININE 2.08* 2.10* 1.99*  CALCIUM 8.5*  --  7.8*   Liver Function Tests: No results for input(s): AST, ALT, ALKPHOS, BILITOT, PROT, ALBUMIN in the last 168 hours. No results for input(s): LIPASE, AMYLASE in the last 168 hours. No results for input(s): AMMONIA in the last 168 hours. CBC:  Recent Labs Lab 02/22/15 1735 02/23/15 0005 02/23/15 0228  WBC 21.6* 17.2* 14.7*  NEUTROABS 19.2*  --   --   HGB 14.3 13.9 12.7*  HCT 43.4 42.8 39.6  MCV 96.0 98.8 98.0  PLT 128* 107* 97*   Cardiac Enzymes: No results for input(s): CKTOTAL, CKMB, CKMBINDEX, TROPONINI in the last 168 hours. BNP: Invalid input(s): POCBNP CBG: No results for input(s): GLUCAP in the last 168 hours.  Recent Results (from the past 240 hour(s))  MRSA PCR Screening     Status: None   Collection Time: 02/22/15 11:04 PM  Result Value Ref Range Status   MRSA by PCR NEGATIVE NEGATIVE Final    Comment:        The GeneXpert MRSA Assay (FDA approved for NASAL specimens only), is one component of a comprehensive MRSA colonization surveillance program. It is not intended to diagnose MRSA infection nor to guide or monitor treatment for MRSA infections.      Scheduled Meds: . aspirin EC  81 mg Oral Daily  . enoxaparin (LOVENOX) injection  40 mg Subcutaneous Q24H  . piperacillin-tazobactam (ZOSYN)  IV  3.375 g Intravenous Q8H  . vancomycin  1,250 mg Intravenous Q24H   Continuous Infusions: . sodium chloride 125 mL/hr at 02/23/15 0342     Henna Derderian, DO  [facility] Pager [phone]  If 7PM-7AM, please contact night-coverage [email or web address] Password [email or web address] 02/23/2015, 11:13 AM   LOS: 1 day

## 2015-02-23 NOTE — Progress Notes (Signed)
Utilization Review completed. Benigno Check RN BSN CM 

## 2015-02-23 NOTE — Plan of Care (Signed)
Problem: Phase I Progression Outcomes Goal: OOB as tolerated unless otherwise ordered Outcome: Completed/Met Date Met:  02/23/15 Pt is very steady on his feet and able to walk around the room without difficulty

## 2015-02-24 DIAGNOSIS — I2510 Atherosclerotic heart disease of native coronary artery without angina pectoris: Secondary | ICD-10-CM

## 2015-02-24 LAB — BASIC METABOLIC PANEL
ANION GAP: 7 (ref 5–15)
BUN: 17 mg/dL (ref 6–20)
CALCIUM: 8.9 mg/dL (ref 8.9–10.3)
CHLORIDE: 111 mmol/L (ref 101–111)
CO2: 22 mmol/L (ref 22–32)
Creatinine, Ser: 1.62 mg/dL — ABNORMAL HIGH (ref 0.61–1.24)
GFR calc non Af Amer: 45 mL/min — ABNORMAL LOW (ref >60)
GFR, EST AFRICAN AMERICAN: 52 mL/min — AB (ref >60)
Glucose, Bld: 78 mg/dL (ref 65–99)
Potassium: 4.6 mmol/L (ref 3.5–5.1)
Sodium: 140 mmol/L (ref 135–145)

## 2015-02-24 LAB — CBC
HEMATOCRIT: 42.4 % (ref 39.0–52.0)
HEMOGLOBIN: 13.9 g/dL (ref 13.0–17.0)
MCH: 32 pg (ref 26.0–34.0)
MCHC: 32.8 g/dL (ref 30.0–36.0)
MCV: 97.5 fL (ref 78.0–100.0)
Platelets: 110 10*3/uL — ABNORMAL LOW (ref 150–400)
RBC: 4.35 MIL/uL (ref 4.22–5.81)
RDW: 15.8 % — ABNORMAL HIGH (ref 11.5–15.5)
WBC: 8.9 10*3/uL (ref 4.0–10.5)

## 2015-02-24 MED ORDER — CARVEDILOL 6.25 MG PO TABS
6.2500 mg | ORAL_TABLET | Freq: Two times a day (BID) | ORAL | Status: DC
  Administered 2015-02-24 – 2015-02-25 (×2): 6.25 mg via ORAL
  Filled 2015-02-24 (×4): qty 1

## 2015-02-24 MED ORDER — LOPERAMIDE HCL 2 MG PO CAPS
2.0000 mg | ORAL_CAPSULE | Freq: Once | ORAL | Status: AC
  Administered 2015-02-24: 2 mg via ORAL
  Filled 2015-02-24: qty 1

## 2015-02-24 MED ORDER — LOPERAMIDE HCL 2 MG PO CAPS
2.0000 mg | ORAL_CAPSULE | ORAL | Status: DC | PRN
  Administered 2015-02-24 – 2015-02-25 (×2): 2 mg via ORAL
  Filled 2015-02-24 (×2): qty 1

## 2015-02-24 NOTE — Progress Notes (Signed)
PROGRESS NOTE  Richard Cook [id number] DOB: November 23, 1955 DOA: 02/22/2015 PCP: Lelon Huh, MD   Brief history 59yo man with PMH of CAD s/p CABG with ICD in place, HLD, HTN, CKD, CHF with EF of 30-35% and grade 1 diastolic dysfunction, gout, anxiety, CKD who presents for 1 day history of acute pain, redness and rash on his left lower extremity. Patient states that he has had a recurrent cellulitis of his left lower extremity in the past, but has not had to be hospitalized nor has he been on any antibiotics for his leg for 4 years. He denies any hardware in his LLE. He denies any recent injuries or trauma or sick contacts. Upon evaluation in the emergency department, the patient was found to be septic with WBC 21.6, lactic acid 1.5, procalcitonin 3.32 with hypotension. The patient was fluid resuscitated and started on intravenous antibiotics after blood cultures were obtained.  Assessment/Plan: Sepsis due to cellulitis of the leg with hypotension - Left leg cellulitis present, reported by patient to be recurrent - continue vanco  - d/c zosyn - BC X 2 drawn--neg - Lactic acid normal - Transfer to medical floor - IVF with NS at 125cc/hr-->saline lock-->BP remains stable - WBC very elevated, but he was also recently on steroids, this could be contributing (finished prednisone on 7/21) - AM cortisol and ACTH--not reliable as pt was on steroids prior 2 weeks - Telemetry - Orthostatics reported as negative in the ED  CAD s/p CABG in 5/14, CHF with EF of 30-35%, ICD in place - Per Dr. Olin Pia last note, patient is supposed to be on coreg and low dose bidil, he was also noted to have somewhat low blood pressures at that time, so his current hypotension could be chronic (102/78 then) - Monitor respiratory status--stable - Holding coreg and lasix in acute setting, restart as indicated - Strict I/Os, daily weight   Hypercholesterolemia - Reported not taking statin, will  continue to hold while inpatient - Defer to outpatient management   Chronic renal impairment, stage 3  - Cr. 2.08 at time of admit - baseline creatinine 1.7-2.0 - BMET in the AM  Back pain, reported history of RA - Recent steroid use, he reports for only 2 weeks - Pain control with hydrocodone/apap and morphine with hold parameters given low BP.  Diarrhea -stool for C diff PCR--neg  Family Communication: Pt at beside Disposition Plan: Home 7/25 if stable   Procedures/Studies: No results found.      Subjective: Patient states that leg pain has improved. Denies any fever, chills, chest discomfort, shortness breath, nausea, vomiting. He is having loose stools. No hematochezia or melena. No abdominal pain. No dysuria.  Objective: Filed Vitals:   02/24/15 0024 02/24/15 0419 02/24/15 0446 02/24/15 0800  BP: 130/80 159/89  129/79  Pulse: 72 71  81  Temp: 98.7 F (37.1 C) 98.5 F (36.9 C)  98.4 F (36.9 C)  TempSrc: Oral Oral  Oral  Resp: 14 19  21   Height:      Weight:   92.8 kg (204 lb 9.4 oz)   SpO2: 98% 100%  97%    Intake/Output Summary (Last 24 hours) at 02/24/15 1223 Last data filed at 02/24/15 1056  Gross per 24 hour  Intake   1400 ml  Output   1150 ml  Net    250 ml   Weight change: -0.4 kg (-14.1 oz) Exam:   General:  Pt  is alert, follows commands appropriately, not in acute distress  HEENT: No icterus, No thrush, No neck mass, [place]/AT  Cardiovascular: RRR, S1/S2, no rubs, no gallops  Respiratory: CTA bilaterally, no wheezing, no crackles, no rhonchi  Abdomen: Soft/+BS, non tender, non distended, no guarding  Extremities: Right lower extremity with erythema from ankle to the infrapatellar area with blanchable and non-blanchable components. There is no crepitance. No draining wounds. No necrosis.  Data Reviewed: Basic Metabolic Panel:  Recent Labs Lab 02/22/15 1735 02/22/15 2142 02/23/15 0228  NA 135  --  138  K 3.8  --  3.5  CL 101   --  106  CO2 22  --  22  GLUCOSE 105*  --  103*  BUN 39*  --  35*  CREATININE 2.08* 2.10* 1.99*  CALCIUM 8.5*  --  7.8*   Liver Function Tests: No results for input(s): AST, ALT, ALKPHOS, BILITOT, PROT, ALBUMIN in the last 168 hours. No results for input(s): LIPASE, AMYLASE in the last 168 hours. No results for input(s): AMMONIA in the last 168 hours. CBC:  Recent Labs Lab 02/22/15 1735 02/23/15 0005 02/23/15 0228  WBC 21.6* 17.2* 14.7*  NEUTROABS 19.2*  --   --   HGB 14.3 13.9 12.7*  HCT 43.4 42.8 39.6  MCV 96.0 98.8 98.0  PLT 128* 107* 97*   Cardiac Enzymes: No results for input(s): CKTOTAL, CKMB, CKMBINDEX, TROPONINI in the last 168 hours. BNP: Invalid input(s): POCBNP CBG: No results for input(s): GLUCAP in the last 168 hours.  Recent Results (from the past 240 hour(s))  Blood culture (routine x 2)     Status: None (Preliminary result)   Collection Time: 02/22/15  5:35 PM  Result Value Ref Range Status   Specimen Description BLOOD RIGHT WRIST  Final   Special Requests BOTTLES DRAWN AEROBIC AND ANAEROBIC 5CC  Final   Culture NO GROWTH < 24 HOURS  Final   Report Status PENDING  Incomplete  Blood culture (routine x 2)     Status: None (Preliminary result)   Collection Time: 02/22/15  5:40 PM  Result Value Ref Range Status   Specimen Description BLOOD LEFT HAND  Final   Special Requests BOTTLES DRAWN AEROBIC AND ANAEROBIC 3CC  Final   Culture NO GROWTH < 24 HOURS  Final   Report Status PENDING  Incomplete  MRSA PCR Screening     Status: None   Collection Time: 02/22/15 11:04 PM  Result Value Ref Range Status   MRSA by PCR NEGATIVE NEGATIVE Final    Comment:        The GeneXpert MRSA Assay (FDA approved for NASAL specimens only), is one component of a comprehensive MRSA colonization surveillance program. It is not intended to diagnose MRSA infection nor to guide or monitor treatment for MRSA infections.   Clostridium Difficile by PCR (not at [facility])      Status: None   Collection Time: 02/23/15 12:40 PM  Result Value Ref Range Status   C difficile by pcr NEGATIVE NEGATIVE Final     Scheduled Meds: . aspirin EC  81 mg Oral Daily  . enoxaparin (LOVENOX) injection  40 mg Subcutaneous Q24H  . vancomycin  1,250 mg Intravenous Q24H   Continuous Infusions:    Yaroslav Gombos, DO  [facility] Pager [phone]  If 7PM-7AM, please contact night-coverage [email or web address] Password [facility] 02/24/2015, 12:23 PM   LOS: 2 days

## 2015-02-25 LAB — BASIC METABOLIC PANEL
Anion gap: 5 (ref 5–15)
BUN: 18 mg/dL (ref 6–20)
CALCIUM: 8.7 mg/dL — AB (ref 8.9–10.3)
CO2: 27 mmol/L (ref 22–32)
Chloride: 103 mmol/L (ref 101–111)
Creatinine, Ser: 1.6 mg/dL — ABNORMAL HIGH (ref 0.61–1.24)
GFR, EST AFRICAN AMERICAN: 53 mL/min — AB (ref >60)
GFR, EST NON AFRICAN AMERICAN: 46 mL/min — AB (ref >60)
Glucose, Bld: 86 mg/dL (ref 65–99)
POTASSIUM: 4.4 mmol/L (ref 3.5–5.1)
SODIUM: 135 mmol/L (ref 135–145)

## 2015-02-25 LAB — CBC
HEMATOCRIT: 41.6 % (ref 39.0–52.0)
HEMOGLOBIN: 13.7 g/dL (ref 13.0–17.0)
MCH: 32.5 pg (ref 26.0–34.0)
MCHC: 32.9 g/dL (ref 30.0–36.0)
MCV: 98.6 fL (ref 78.0–100.0)
PLATELETS: 118 10*3/uL — AB (ref 150–400)
RBC: 4.22 MIL/uL (ref 4.22–5.81)
RDW: 15.6 % — ABNORMAL HIGH (ref 11.5–15.5)
WBC: 7.9 10*3/uL (ref 4.0–10.5)

## 2015-02-25 LAB — ACTH: C206 ACTH: 11.4 pg/mL (ref 7.2–63.3)

## 2015-02-25 MED ORDER — DOXYCYCLINE HYCLATE 100 MG PO TABS
100.0000 mg | ORAL_TABLET | Freq: Two times a day (BID) | ORAL | Status: DC
  Administered 2015-02-25: 100 mg via ORAL
  Filled 2015-02-25 (×2): qty 1

## 2015-02-25 MED ORDER — DOXYCYCLINE HYCLATE 100 MG PO TABS: 100.0000 mg | ORAL_TABLET | Freq: Two times a day (BID) | ORAL | Status: DC

## 2015-02-25 NOTE — Discharge Summary (Signed)
Physician Discharge Summary  [name] DOB: 1956-07-26 DOA: 02/22/2015  PCP: Richard Huh, MD  Admit date: 02/22/2015 Discharge date: 02/25/2015  Recommendations for Outpatient Follow-up:  1. Pt will need to follow up with PCP in 1-2 weeks post discharge 2. Please obtain BMP and CBC in 1 week Discharge Diagnoses:  Sepsis due to cellulitis of the leg with hypotension - Left leg cellulitis present, reported by patient to be recurrent - continue vanco -->home with doxycycline x 7 more days to complete 10 days of tx - d/c zosyn - BC X 2 drawn--neg - Lactic acid normal - Transfer to medical floor - IVF with NS at 125cc/hr-->saline lock-->BP remains stable - WBC imitially elevated, but he was also recently on steroids, this could be contributing (finished prednisone on 7/21)-->7.9 on day of d/c - AM cortisol and ACTH--not reliable as pt was on steroids prior 2 weeks - Telemetry--no concerning dysrhythmias - Orthostatics reported as negative in the ED  CAD s/p CABG in 5/14, CHF with EF of 30-35%, ICD in place - Per Dr. Olin Cook last note, patient is supposed to be on coreg and low dose bidil, he was also noted to have somewhat low blood pressures at that time, so his current hypotension could be chronic (102/78 then) - Monitor respiratory status--stable - Holding coreg and lasix in acute setting -Carvedilol was restarted as the patient's blood pressure improved as he became hypertensive-->improved -pt has not been on Bi-Dil--he stated he never got Rx--I instructed pt to keep a BP log for the next 1-2 wks and contact his cardiologist if his BP remains elevated for adjustment of his meds -Restart furosemide after discharge on 02/26/2015 - Strict I/Os, daily weight--weight remained stable   Hypercholesterolemia - Reported not taking statin prior to admission, will continue to hold while inpatient - Defer to outpatient management   Chronic renal impairment,  stage 3  - Cr. 2.08 at time of admit - baseline creatinine 1.7-2.0 - BMET in the AM--serum creatinine 1.60 on the day of discharge  Back pain, reported history of RA - Recent steroid use, he reports for only 2 weeks - Pain control with hydrocodone/apap and morphine with hold parameters given low BP.  Diarrhea -stool for C diff PCR--neg  Discharge Condition: stable  Disposition: home  Diet:heart healthy Wt Readings from Last 3 Encounters:  02/25/15 91.9 kg (202 lb 9.6 oz)  11/15/14 91.627 kg (202 lb)  10/01/14 95.709 kg (211 lb)    History of present illness:  59yo man with PMH of CAD s/p CABG with ICD in place, HLD, HTN, CKD, CHF with EF of 30-35% and grade 1 diastolic dysfunction, gout, anxiety, CKD who presents for 1 day history of acute pain, redness and rash on his left lower extremity. Patient states that he has had a recurrent cellulitis of his left lower extremity in the past, but has not had to be hospitalized nor has he been on any antibiotics for his leg for 4 years. He denies any hardware in his LLE. He denies any recent injuries or trauma or sick contacts. Upon evaluation in the emergency department, the patient was found to be septic with WBC 21.6, lactic acid 1.5, procalcitonin 3.32 with hypotension. The patient was fluid resuscitated and started on intravenous antibiotics after blood cultures were obtained. The patient's blood cultures remained negative. His blood pressure improved, and the patient's carvedilol was restarted during the hospitalization. He will resume furosemide after discharge on 02/26/2015 with his previous home dose. The  patient will be discharged with doxycycline for 7 more days to finish 10 days of therapy.  Discharge Exam: Filed Vitals:   02/25/15 0510  BP: 152/96  Pulse: 58  Temp: 97.4 F (36.3 C)  Resp: 18   Filed Vitals:   02/24/15 1958 02/24/15 2220 02/25/15 0018 02/25/15 0510  BP: 155/94  121/70 152/96  Pulse:   67 58  Temp: 100.1  F (37.8 C) 98.6 F (37 C) 98.6 F (37 C) 97.4 F (36.3 C)  TempSrc: Oral Oral Oral Oral  Resp:   19 18  Height:      Weight:    91.9 kg (202 lb 9.6 oz)  SpO2: 100%  97% 100%   General: A&O x 3, NAD, pleasant, cooperative Cardiovascular: RRR, no rub, no gallop, no S3 Respiratory: CTAB, no wheeze, no rhonchi Abdomen:soft, nontender, nondistended, positive bowel sounds Extremities: Left lower extremity with erythema consisting of blanchable and non-blanchable erythema. There is no crepitance, draining wounds, necrosis.  Discharge Instructions      Discharge Instructions    Diet - low sodium heart healthy    Complete by:  As directed      Increase activity slowly    Complete by:  As directed             Medication List    STOP taking these medications        atorvastatin 40 MG tablet  Commonly known as:  LIPITOR     isosorbide-hydrALAZINE 20-37.5 MG per tablet  Commonly known as:  BIDIL     potassium chloride SA 20 MEQ tablet  Commonly known as:  K-DUR,KLOR-CON      TAKE these medications        allopurinol 300 MG tablet  Commonly known as:  ZYLOPRIM  Take 300 mg by mouth daily.     aspirin EC 81 MG tablet  Take 81 mg by mouth daily.     carvedilol 6.25 MG tablet  Commonly known as:  COREG  Take 6.25 mg by mouth 2 (two) times daily with a meal.     clonazePAM 2 MG tablet  Commonly known as:  KLONOPIN  Take 1/2-1 tablet by mouth 3 times a day as needed     doxycycline 100 MG tablet  Commonly known as:  VIBRA-TABS  Take 1 tablet (100 mg total) by mouth every 12 (twelve) hours.     furosemide 40 MG tablet  Commonly known as:  LASIX  I tab every other day     HYDROcodone-acetaminophen 7.5-325 MG per tablet  Commonly known as:  NORCO  Take 1 tablet by mouth every 6 (six) hours as needed for moderate pain.         The results of significant diagnostics from this hospitalization (including imaging, microbiology, ancillary and laboratory) are listed  below for reference.    Significant Diagnostic Studies: No results found.   Microbiology: Recent Results (from the past 240 hour(s))  Blood culture (routine x 2)     Status: None (Preliminary result)   Collection Time: 02/22/15  5:35 PM  Result Value Ref Range Status   Specimen Description BLOOD RIGHT WRIST  Final   Special Requests BOTTLES DRAWN AEROBIC AND ANAEROBIC 5CC  Final   Culture NO GROWTH 2 DAYS  Final   Report Status PENDING  Incomplete  Blood culture (routine x 2)     Status: None (Preliminary result)   Collection Time: 02/22/15  5:40 PM  Result Value Ref Range Status  Specimen Description BLOOD LEFT HAND  Final   Special Requests BOTTLES DRAWN AEROBIC AND ANAEROBIC 3CC  Final   Culture NO GROWTH 2 DAYS  Final   Report Status PENDING  Incomplete  MRSA PCR Screening     Status: None   Collection Time: 02/22/15 11:04 PM  Result Value Ref Range Status   MRSA by PCR NEGATIVE NEGATIVE Final    Comment:        The GeneXpert MRSA Assay (FDA approved for NASAL specimens only), is one component of a comprehensive MRSA colonization surveillance program. It is not intended to diagnose MRSA infection nor to guide or monitor treatment for MRSA infections.   Clostridium Difficile by PCR (not at [facility])     Status: None   Collection Time: 02/23/15 12:40 PM  Result Value Ref Range Status   C difficile by pcr NEGATIVE NEGATIVE Final     Labs: Basic Metabolic Panel:  Recent Labs Lab 02/22/15 1735 02/22/15 2142 02/23/15 0228 02/24/15 1325 02/25/15 0303  NA 135  --  138 140 135  K 3.8  --  3.5 4.6 4.4  CL 101  --  106 111 103  CO2 22  --  22 22 27   GLUCOSE 105*  --  103* 78 86  BUN 39*  --  35* 17 18  CREATININE 2.08* 2.10* 1.99* 1.62* 1.60*  CALCIUM 8.5*  --  7.8* 8.9 8.7*   Liver Function Tests: No results for input(s): AST, ALT, ALKPHOS, BILITOT, PROT, ALBUMIN in the last 168 hours. No results for input(s): LIPASE, AMYLASE in the last 168 hours. No results  for input(s): AMMONIA in the last 168 hours. CBC:  Recent Labs Lab 02/22/15 1735 02/23/15 0005 02/23/15 0228 02/24/15 1325 02/25/15 0303  WBC 21.6* 17.2* 14.7* 8.9 7.9  NEUTROABS 19.2*  --   --   --   --   HGB 14.3 13.9 12.7* 13.9 13.7  HCT 43.4 42.8 39.6 42.4 41.6  MCV 96.0 98.8 98.0 97.5 98.6  PLT 128* 107* 97* 110* 118*   Cardiac Enzymes: No results for input(s): CKTOTAL, CKMB, CKMBINDEX, TROPONINI in the last 168 hours. BNP: Invalid input(s): POCBNP CBG: No results for input(s): GLUCAP in the last 168 hours.  Time coordinating discharge:  Greater than 30 minutes  Signed:  Reneshia Zuccaro, DO [facility] Pager: [phone] 02/25/2015, 7:31 AM

## 2015-02-27 LAB — CULTURE, BLOOD (ROUTINE X 2)
CULTURE: NO GROWTH
Culture: NO GROWTH

## 2015-03-11 ENCOUNTER — Telehealth: Payer: Self-pay | Admitting: Family Medicine

## 2015-03-11 NOTE — Telephone Encounter (Signed)
HYDROcodone-acetaminophen (NORCO) 7.5-325 MG per tablet  02/22/2015 02/12/15 -- Richard Sons, MD    Take 1 tablet by mouth every 6 (six) hours as needed for moderate pain.      Pt stated he would like to go back on the 10mg s.  Thank [name]

## 2015-03-12 ENCOUNTER — Telehealth: Payer: Self-pay | Admitting: Family Medicine

## 2015-03-12 MED ORDER — HYDROCODONE-ACETAMINOPHEN 10-325 MG PO TABS
1.0000 | ORAL_TABLET | Freq: Four times a day (QID) | ORAL | Status: DC | PRN
Start: 2015-03-12 — End: 2015-04-09

## 2015-03-12 NOTE — Telephone Encounter (Signed)
I called pt to advised RX for Hydrocodone was ready for pick up. Thanks TNP

## 2015-03-29 DIAGNOSIS — M2550 Pain in unspecified joint: Secondary | ICD-10-CM

## 2015-03-29 DIAGNOSIS — R609 Edema, unspecified: Secondary | ICD-10-CM | POA: Insufficient documentation

## 2015-03-29 DIAGNOSIS — M5416 Radiculopathy, lumbar region: Secondary | ICD-10-CM

## 2015-03-29 DIAGNOSIS — G4700 Insomnia, unspecified: Secondary | ICD-10-CM | POA: Insufficient documentation

## 2015-03-29 HISTORY — DX: Pain in unspecified joint: M25.50

## 2015-03-29 HISTORY — DX: Radiculopathy, lumbar region: M54.16

## 2015-04-01 ENCOUNTER — Ambulatory Visit: Payer: Self-pay | Admitting: Family Medicine

## 2015-04-09 ENCOUNTER — Other Ambulatory Visit: Payer: Self-pay | Admitting: Family Medicine

## 2015-04-09 NOTE — Telephone Encounter (Signed)
HYDROcodone-acetaminophen (NORCO) 10-325 MG per tablet 03/12/15 -- Birdie Sons, MD Take 1 tablet by mouth every 6 (six) hours as needed.   Pt called for refill to be picked up  Thanks teri

## 2015-04-10 MED ORDER — HYDROCODONE-ACETAMINOPHEN 10-325 MG PO TABS: 1.0000 | ORAL_TABLET | Freq: Four times a day (QID) | ORAL | Status: DC | PRN

## 2015-04-11 ENCOUNTER — Encounter: Payer: Self-pay | Admitting: Family Medicine

## 2015-04-11 ENCOUNTER — Ambulatory Visit (INDEPENDENT_AMBULATORY_CARE_PROVIDER_SITE_OTHER): Payer: Self-pay | Admitting: Family Medicine

## 2015-04-11 ENCOUNTER — Telehealth: Payer: Self-pay | Admitting: Family Medicine

## 2015-04-11 VITALS — BP 128/92 | HR 100 | Temp 98.0°F | Resp 16 | Wt 211.0 lb

## 2015-04-11 DIAGNOSIS — L03116 Cellulitis of left lower limb: Secondary | ICD-10-CM

## 2015-04-11 DIAGNOSIS — L03119 Cellulitis of unspecified part of limb: Secondary | ICD-10-CM | POA: Insufficient documentation

## 2015-04-11 MED ORDER — AMOXICILLIN-POT CLAVULANATE 875-125 MG PO TABS
1.0000 | ORAL_TABLET | Freq: Two times a day (BID) | ORAL | Status: DC
Start: 2015-04-11 — End: 2015-04-17

## 2015-04-11 NOTE — Patient Instructions (Signed)
Recommend you take OTC Probiotics while on Augmentin to prevent from getting diarrhea

## 2015-04-11 NOTE — Progress Notes (Signed)
       Patient: Richard Cook Male    DOB: 05/03/1956   59 y.o.   MRN: [id number] Visit Date: 04/11/2015  Today's Provider: Lelon Huh, MD   Chief Complaint  Patient presents with  . Leg Pain  . Recurrent Skin Infections  . Fever  . Dizziness   Subjective:    HPI    Follow up Hospitalization  Patient was admitted to [facility] on 02/22/2015 and discharged on 02/25/2015 He was treated for Cellulitis and sepsis. Treatment for this included antibiotic. He reports condition is the same or worse them before.  Redness had entirely resolved except for a few small spots on his lower leg. He woke up yesterday and anterior was red again, mildly swollen, and tender. It is about the same today. He has had a few chills.  ------------------------------------------------------------------------------------    Allergies  Allergen Reactions  . Codeine Nausea Only    Tolerates hydrocodone   Previous Medications   ALLOPURINOL (ZYLOPRIM) 300 MG TABLET    Take 300 mg by mouth daily.   ASPIRIN EC 81 MG TABLET    Take 81 mg by mouth daily.   ATORVASTATIN (LIPITOR) 20 MG TABLET    Take 20 mg by mouth daily.   CARVEDILOL (COREG) 6.25 MG TABLET    Take 6.25 mg by mouth 2 (two) times daily with a meal.   CLONAZEPAM (KLONOPIN) 2 MG TABLET    Take 1/2-1 tablet by mouth 3 times a day as needed   DOXYCYCLINE (VIBRA-TABS) 100 MG TABLET    Take 1 tablet (100 mg total) by mouth every 12 (twelve) hours.   FUROSEMIDE (LASIX) 40 MG TABLET    I tab every other day   HYDROCODONE-ACETAMINOPHEN (NORCO) 10-325 MG PER TABLET    Take 1 tablet by mouth every 6 (six) hours as needed.   LISINOPRIL-HYDROCHLOROTHIAZIDE (PRINZIDE,ZESTORETIC) 10-12.5 MG PER TABLET    Take 1 tablet by mouth daily.   TRIAMCINOLONE CREAM (KENALOG) 0.5 %    Apply 1 application topically. Onto rash 1-2 times a day as needed    Review of Systems  Constitutional: Positive for fever and chills.  Cardiovascular: Positive for leg  swelling. Negative for chest pain and palpitations.  Skin: Positive for color change and wound.  Neurological: Positive for dizziness and light-headedness. Negative for headaches.    Social History  Substance Use Topics  . Smoking status: Never Smoker   . Smokeless tobacco: Never Used  . Alcohol Use: No   Objective:   BP 128/92 mmHg  Pulse 100  Temp(Src) 98 F (36.7 C) (Oral)  Resp 16  Wt 211 lb (95.709 kg)  SpO2 98%  Physical Exam  Left lower anterior leg diffusely erythematous, tender to the touch. No open sores, no drainage. Old surgical scar intact.      Assessment & Plan:     1. Cellulitis of left lower extremity  - amoxicillin-clavulanate (AUGMENTIN) 875-125 MG per tablet; Take 1 tablet by mouth 2 (two) times daily.  Dispense: 28 tablet; Refill: 0  Call if symptoms change or if not rapidly improving.          Lelon Huh, MD  [facility]

## 2015-04-16 ENCOUNTER — Inpatient Hospital Stay (HOSPITAL_COMMUNITY)
Admission: EM | Admit: 2015-04-16 | Discharge: 2015-04-19 | DRG: 603 | Disposition: A | Discharge status: Home / Self Care | Payer: Self-pay | Attending: Internal Medicine | Admitting: Internal Medicine

## 2015-04-16 ENCOUNTER — Telehealth: Payer: Self-pay | Admitting: Family Medicine

## 2015-04-16 ENCOUNTER — Encounter (HOSPITAL_COMMUNITY): Payer: Self-pay | Admitting: Emergency Medicine

## 2015-04-16 DIAGNOSIS — Z79899 Other long term (current) drug therapy: Secondary | ICD-10-CM

## 2015-04-16 DIAGNOSIS — I10 Essential (primary) hypertension: Secondary | ICD-10-CM | POA: Diagnosis present

## 2015-04-16 DIAGNOSIS — Z95810 Presence of automatic (implantable) cardiac defibrillator: Secondary | ICD-10-CM

## 2015-04-16 DIAGNOSIS — I2510 Atherosclerotic heart disease of native coronary artery without angina pectoris: Secondary | ICD-10-CM | POA: Diagnosis present

## 2015-04-16 DIAGNOSIS — L03116 Cellulitis of left lower limb: Principal | ICD-10-CM | POA: Diagnosis present

## 2015-04-16 DIAGNOSIS — M069 Rheumatoid arthritis, unspecified: Secondary | ICD-10-CM | POA: Diagnosis present

## 2015-04-16 DIAGNOSIS — I129 Hypertensive chronic kidney disease with stage 1 through stage 4 chronic kidney disease, or unspecified chronic kidney disease: Secondary | ICD-10-CM | POA: Diagnosis present

## 2015-04-16 DIAGNOSIS — Z951 Presence of aortocoronary bypass graft: Secondary | ICD-10-CM

## 2015-04-16 DIAGNOSIS — M10031 Idiopathic gout, right wrist: Secondary | ICD-10-CM | POA: Diagnosis present

## 2015-04-16 DIAGNOSIS — N183 Chronic kidney disease, stage 3 (moderate): Secondary | ICD-10-CM | POA: Diagnosis present

## 2015-04-16 DIAGNOSIS — M79609 Pain in unspecified limb: Secondary | ICD-10-CM

## 2015-04-16 DIAGNOSIS — G8929 Other chronic pain: Secondary | ICD-10-CM | POA: Diagnosis present

## 2015-04-16 DIAGNOSIS — M5416 Radiculopathy, lumbar region: Secondary | ICD-10-CM | POA: Diagnosis present

## 2015-04-16 DIAGNOSIS — Z86718 Personal history of other venous thrombosis and embolism: Secondary | ICD-10-CM

## 2015-04-16 DIAGNOSIS — Z7982 Long term (current) use of aspirin: Secondary | ICD-10-CM

## 2015-04-16 DIAGNOSIS — I5042 Chronic combined systolic (congestive) and diastolic (congestive) heart failure: Secondary | ICD-10-CM | POA: Diagnosis present

## 2015-04-16 LAB — CBC WITH DIFFERENTIAL/PLATELET
BASOS PCT: 0 % (ref 0–1)
Basophils Absolute: 0 10*3/uL (ref 0.0–0.1)
EOS PCT: 2 % (ref 0–5)
Eosinophils Absolute: 0.2 10*3/uL (ref 0.0–0.7)
HCT: 42.3 % (ref 39.0–52.0)
Hemoglobin: 13.8 g/dL (ref 13.0–17.0)
Lymphocytes Relative: 23 % (ref 12–46)
Lymphs Abs: 1.6 10*3/uL (ref 0.7–4.0)
MCH: 32.4 pg (ref 26.0–34.0)
MCHC: 32.6 g/dL (ref 30.0–36.0)
MCV: 99.3 fL (ref 78.0–100.0)
MONO ABS: 0.7 10*3/uL (ref 0.1–1.0)
Monocytes Relative: 10 % (ref 3–12)
Neutro Abs: 4.6 10*3/uL (ref 1.7–7.7)
Neutrophils Relative %: 65 % (ref 43–77)
PLATELETS: 213 10*3/uL (ref 150–400)
RBC: 4.26 MIL/uL (ref 4.22–5.81)
RDW: 13.5 % (ref 11.5–15.5)
WBC: 7.1 10*3/uL (ref 4.0–10.5)

## 2015-04-16 LAB — BASIC METABOLIC PANEL
Anion gap: 11 (ref 5–15)
BUN: 15 mg/dL (ref 6–20)
CALCIUM: 9.3 mg/dL (ref 8.9–10.3)
CO2: 22 mmol/L (ref 22–32)
Chloride: 106 mmol/L (ref 101–111)
Creatinine, Ser: 1.87 mg/dL — ABNORMAL HIGH (ref 0.61–1.24)
GFR, EST AFRICAN AMERICAN: 44 mL/min — AB (ref >60)
GFR, EST NON AFRICAN AMERICAN: 38 mL/min — AB (ref >60)
GLUCOSE: 80 mg/dL (ref 65–99)
Potassium: 4.1 mmol/L (ref 3.5–5.1)
Sodium: 139 mmol/L (ref 135–145)

## 2015-04-16 MED ORDER — CLINDAMYCIN PHOSPHATE 600 MG/50ML IV SOLN
600.0000 mg | Freq: Once | INTRAVENOUS | Status: AC
  Administered 2015-04-16: 600 mg via INTRAVENOUS
  Filled 2015-04-16: qty 50

## 2015-04-16 MED ORDER — HYDROCODONE-ACETAMINOPHEN 5-325 MG PO TABS
1.0000 | ORAL_TABLET | Freq: Once | ORAL | Status: AC
  Administered 2015-04-16: 1 via ORAL
  Filled 2015-04-16: qty 1

## 2015-04-16 NOTE — Telephone Encounter (Signed)
Pt is requesting a letter to give to his employer stating he was seen last Thursday 04/11/15 for cellulitis in his left leg.  Pt states he was suppose to be out of work until 04/16/2015 but his employer told pt he could have Thursday and Friday off but would need to be back to work on Saturday.  Pt is requesting the letter to state how serious this is and that pt need to take the amount of time off to get his leg better.  Pt states he has a 103 fever today and his leg is worse than it was on Thursday due to be up on it 7 to 8 hours a day.  [id number]

## 2015-04-16 NOTE — ED Notes (Signed)
Pt reports measured temperatures of 102-103 last night. Chills intermittently.

## 2015-04-16 NOTE — Telephone Encounter (Signed)
He needs to go to ER. He should not be having fever at this point. If he is still fever then the oral antibiotics are not working.

## 2015-04-16 NOTE — ED Notes (Signed)
Pt sts left leg cellulitis with redness and swelling; pt sts taking antibiotics but not improving; redness to leg noted with swelling
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
## 2015-04-16 NOTE — Telephone Encounter (Signed)
[facility] to return call.

## 2015-04-16 NOTE — Telephone Encounter (Signed)
Patient advised. Patient stated that he will go to ER now.

## 2015-04-17 ENCOUNTER — Encounter (HOSPITAL_COMMUNITY): Payer: Self-pay | Admitting: *Deleted

## 2015-04-17 DIAGNOSIS — M79605 Pain in left leg: Secondary | ICD-10-CM

## 2015-04-17 DIAGNOSIS — N183 Chronic kidney disease, stage 3 (moderate): Secondary | ICD-10-CM

## 2015-04-17 DIAGNOSIS — M5416 Radiculopathy, lumbar region: Secondary | ICD-10-CM

## 2015-04-17 DIAGNOSIS — I5042 Chronic combined systolic (congestive) and diastolic (congestive) heart failure: Secondary | ICD-10-CM

## 2015-04-17 DIAGNOSIS — M79609 Pain in unspecified limb: Secondary | ICD-10-CM | POA: Insufficient documentation

## 2015-04-17 DIAGNOSIS — I10 Essential (primary) hypertension: Secondary | ICD-10-CM

## 2015-04-17 DIAGNOSIS — L03116 Cellulitis of left lower limb: Principal | ICD-10-CM

## 2015-04-17 LAB — CBC
HEMATOCRIT: 35.7 % — AB (ref 39.0–52.0)
HEMOGLOBIN: 11.6 g/dL — AB (ref 13.0–17.0)
MCH: 32.2 pg (ref 26.0–34.0)
MCHC: 32.5 g/dL (ref 30.0–36.0)
MCV: 99.2 fL (ref 78.0–100.0)
Platelets: 178 10*3/uL (ref 150–400)
RBC: 3.6 MIL/uL — ABNORMAL LOW (ref 4.22–5.81)
RDW: 13.6 % (ref 11.5–15.5)
WBC: 6.3 10*3/uL (ref 4.0–10.5)

## 2015-04-17 MED ORDER — LISINOPRIL-HYDROCHLOROTHIAZIDE 10-12.5 MG PO TABS: 1.0000 | ORAL_TABLET | Freq: Every day | ORAL | Status: DC

## 2015-04-17 MED ORDER — HYDROCODONE-ACETAMINOPHEN 10-325 MG PO TABS
1.0000 | ORAL_TABLET | Freq: Four times a day (QID) | ORAL | Status: DC | PRN
  Administered 2015-04-17 – 2015-04-19 (×8): 1 via ORAL
  Filled 2015-04-17 (×8): qty 1

## 2015-04-17 MED ORDER — CLINDAMYCIN PHOSPHATE 600 MG/50ML IV SOLN
600.0000 mg | Freq: Three times a day (TID) | INTRAVENOUS | Status: DC
  Administered 2015-04-17 – 2015-04-19 (×6): 600 mg via INTRAVENOUS
  Filled 2015-04-17 (×6): qty 50

## 2015-04-17 MED ORDER — CLONAZEPAM 1 MG PO TABS
1.0000 mg | ORAL_TABLET | Freq: Three times a day (TID) | ORAL | Status: DC | PRN
  Administered 2015-04-17 – 2015-04-18 (×2): 1 mg via ORAL
  Filled 2015-04-17 (×2): qty 1

## 2015-04-17 MED ORDER — LISINOPRIL 10 MG PO TABS
10.0000 mg | ORAL_TABLET | Freq: Every day | ORAL | Status: DC
  Administered 2015-04-17 – 2015-04-19 (×3): 10 mg via ORAL
  Filled 2015-04-17 (×3): qty 1

## 2015-04-17 MED ORDER — FUROSEMIDE 20 MG PO TABS
20.0000 mg | ORAL_TABLET | ORAL | Status: DC
  Administered 2015-04-17 – 2015-04-19 (×2): 20 mg via ORAL
  Filled 2015-04-17 (×3): qty 1

## 2015-04-17 MED ORDER — ENOXAPARIN SODIUM 40 MG/0.4ML ~~LOC~~ SOLN
40.0000 mg | SUBCUTANEOUS | Status: DC
  Administered 2015-04-17 – 2015-04-18 (×2): 40 mg via SUBCUTANEOUS
  Filled 2015-04-17 (×2): qty 0.4

## 2015-04-17 MED ORDER — ASPIRIN EC 81 MG PO TBEC
81.0000 mg | DELAYED_RELEASE_TABLET | Freq: Every day | ORAL | Status: DC
  Administered 2015-04-17 – 2015-04-19 (×3): 81 mg via ORAL
  Filled 2015-04-17 (×3): qty 1

## 2015-04-17 MED ORDER — CLINDAMYCIN HCL 300 MG PO CAPS
300.0000 mg | ORAL_CAPSULE | Freq: Four times a day (QID) | ORAL | Status: DC
  Administered 2015-04-17 (×2): 300 mg via ORAL
  Filled 2015-04-17 (×2): qty 1

## 2015-04-17 MED ORDER — ATORVASTATIN CALCIUM 20 MG PO TABS
20.0000 mg | ORAL_TABLET | Freq: Every day | ORAL | Status: DC
  Administered 2015-04-17 – 2015-04-19 (×3): 20 mg via ORAL
  Filled 2015-04-17 (×3): qty 1

## 2015-04-17 MED ORDER — ALLOPURINOL 300 MG PO TABS
300.0000 mg | ORAL_TABLET | Freq: Every day | ORAL | Status: DC
  Administered 2015-04-17 – 2015-04-19 (×3): 300 mg via ORAL
  Filled 2015-04-17 (×3): qty 1

## 2015-04-17 MED ORDER — CARVEDILOL 6.25 MG PO TABS
6.2500 mg | ORAL_TABLET | Freq: Two times a day (BID) | ORAL | Status: DC
  Administered 2015-04-17 – 2015-04-19 (×5): 6.25 mg via ORAL
  Filled 2015-04-17 (×5): qty 1

## 2015-04-17 MED ORDER — HYDROCHLOROTHIAZIDE 12.5 MG PO CAPS
12.5000 mg | ORAL_CAPSULE | Freq: Every day | ORAL | Status: DC
  Administered 2015-04-17 – 2015-04-18 (×2): 12.5 mg via ORAL
  Filled 2015-04-17 (×2): qty 1

## 2015-04-17 NOTE — Progress Notes (Signed)
Patient very concern about antibioticus . Dr. Herschell Dimes called and ordered IV med to be given now . Ordered as given done

## 2015-04-17 NOTE — Progress Notes (Signed)
[facility] PROGRESS NOTE   Richard Cook [id number] DOB: 1955/12/13 DOA: 04/16/2015 PCP: Lelon Huh, MD  HPI/Subjective: Denies any fever or chills  Assessment/Plan: Principal Problem:   Cellulitis Active Problems:   Chronic renal impairment, stage 3 (moderate)   Chronic combined systolic and diastolic CHF (congestive heart failure)   Radiculopathy of lumbar region   Essential (primary) hypertension   This is a no charge note, patient seen earlier today by my colleague Dr. Loleta Books. Admitted for left lower extremity cellulitis, failed outpatient Augmentin treatment. Started on IV clindamycin, continued. Stating that he is better.  Code Status: Full Code Family Communication: Plan discussed with the patient. Disposition Plan: Remains inpatient Diet: Diet regular Room service appropriate?: Yes; Fluid consistency:: Thin  Consultants:  None  Procedures:  None  Antibiotics:  IV clindamycin   Objective: Filed Vitals:   04/17/15 0946  BP: 105/49  Pulse: 65  Temp: 98 F (36.7 C)  Resp: 16    Intake/Output Summary (Last 24 hours) at 04/17/15 1446 Last data filed at 04/17/15 0555  Gross per 24 hour  Intake      0 ml  Output      0 ml  Net      0 ml   Filed Weights   04/17/15 0045  Weight: 92.2 kg (203 lb 4.2 oz)    Exam: General: Alert and awake, oriented x3, not in any acute distress. HEENT: anicteric sclera, pupils reactive to light and accommodation, EOMI CVS: S1-S2 clear, no murmur rubs or gallops Chest: clear to auscultation bilaterally, no wheezing, rales or rhonchi Abdomen: soft nontender, nondistended, normal bowel sounds, no organomegaly Extremities: no cyanosis, clubbing or edema noted bilaterally Neuro: Cranial nerves II-XII intact, no focal neurological deficits  Data Reviewed: Basic Metabolic Panel:  Recent Labs Lab 04/16/15 1539  NA 139  K 4.1  CL 106  CO2 22  GLUCOSE 80  BUN 15  CREATININE 1.87*  CALCIUM  9.3   Liver Function Tests: No results for input(s): AST, ALT, ALKPHOS, BILITOT, PROT, ALBUMIN in the last 168 hours. No results for input(s): LIPASE, AMYLASE in the last 168 hours. No results for input(s): AMMONIA in the last 168 hours. CBC:  Recent Labs Lab 04/16/15 1539 04/17/15 0430  WBC 7.1 6.3  NEUTROABS 4.6  --   HGB 13.8 11.6*  HCT 42.3 35.7*  MCV 99.3 99.2  PLT 213 178   Cardiac Enzymes: No results for input(s): CKTOTAL, CKMB, CKMBINDEX, TROPONINI in the last 168 hours. BNP (last 3 results) No results for input(s): BNP in the last 8760 hours.  ProBNP (last 3 results)  Recent Labs  08/30/14 1036  PROBNP 87.0    CBG: No results for input(s): GLUCAP in the last 168 hours.  Micro No results found for this or any previous visit (from the past 240 hour(s)).   Studies: No results found.  Scheduled Meds: . allopurinol  300 mg Oral Daily  . aspirin EC  81 mg Oral Daily  . atorvastatin  20 mg Oral Daily  . carvedilol  6.25 mg Oral BID WC  . enoxaparin (LOVENOX) injection  40 mg Subcutaneous Q24H  . furosemide  20 mg Oral QODAY  . lisinopril  10 mg Oral Daily   And  . hydrochlorothiazide  12.5 mg Oral Daily   Continuous Infusions:      Time spent: 35 minutes    [facility]  [facility] Pager [phone] If 7PM-7AM, please contact night-coverage at [email or web address], password [facility] 04/17/2015, 2:46 PM  LOS: 1 day
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
## 2015-04-18 ENCOUNTER — Encounter (HOSPITAL_COMMUNITY): Payer: Self-pay

## 2015-04-18 MED ORDER — COLCHICINE 0.6 MG PO TABS
0.6000 mg | ORAL_TABLET | Freq: Every day | ORAL | Status: DC
  Administered 2015-04-18 – 2015-04-19 (×2): 0.6 mg via ORAL
  Filled 2015-04-18 (×2): qty 1

## 2015-04-18 MED ORDER — INDOMETHACIN 50 MG PO CAPS
50.0000 mg | ORAL_CAPSULE | Freq: Three times a day (TID) | ORAL | Status: DC
  Administered 2015-04-18 – 2015-04-19 (×3): 50 mg via ORAL
  Filled 2015-04-18 (×6): qty 1

## 2015-04-18 NOTE — Progress Notes (Signed)
Utilization review completed. Deran Barro, RN, BSN. 
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
## 2015-04-19 ENCOUNTER — Inpatient Hospital Stay (HOSPITAL_COMMUNITY): Payer: MEDICAID

## 2015-04-19 LAB — BASIC METABOLIC PANEL
Anion gap: 9 (ref 5–15)
BUN: 19 mg/dL (ref 6–20)
CALCIUM: 8.9 mg/dL (ref 8.9–10.3)
CO2: 27 mmol/L (ref 22–32)
CREATININE: 1.96 mg/dL — AB (ref 0.61–1.24)
Chloride: 107 mmol/L (ref 101–111)
GFR, EST AFRICAN AMERICAN: 42 mL/min — AB (ref >60)
GFR, EST NON AFRICAN AMERICAN: 36 mL/min — AB (ref >60)
GLUCOSE: 99 mg/dL (ref 65–99)
Potassium: 4.3 mmol/L (ref 3.5–5.1)
Sodium: 143 mmol/L (ref 135–145)

## 2015-04-19 MED ORDER — DOXYCYCLINE HYCLATE 100 MG PO TABS: 100.0000 mg | ORAL_TABLET | Freq: Two times a day (BID) | ORAL | Status: DC

## 2015-04-19 MED ORDER — COLCHICINE 0.6 MG PO TABS: 0.6000 mg | ORAL_TABLET | Freq: Every day | ORAL | Status: DC

## 2015-04-19 MED ORDER — CEPHALEXIN 500 MG PO CAPS
500.0000 mg | ORAL_CAPSULE | Freq: Three times a day (TID) | ORAL | Status: DC
Start: 2015-04-19 — End: 2015-04-29

## 2015-04-19 NOTE — Care Management Note (Signed)
Case Management Note  Patient Details  Name: Richard Cook MRN: [id number] Date of Birth: 02-21-1956  Subjective/Objective:    CM following for progression and d/c planning.                Action/Plan: No HH or DME orders identified.   Expected Discharge Date:       04/19/2015           Expected Discharge Plan:  Home/Self Care  In-House Referral:  NA  Discharge planning Services  NA  Post Acute Care Choice:  NA Choice offered to:  NA  DME Arranged:    DME Agency:     HH Arranged:    HH Agency:     Status of Service:  Completed, signed off  Medicare Important Message Given:    Date Medicare IM Given:    Medicare IM give by:    Date Additional Medicare IM Given:    Additional Medicare Important Message give by:     If discussed at [place] of Stay Meetings, dates discussed:    Additional Comments:  Adron Bene, RN 04/19/2015, 4:39 PM
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
## 2015-04-22 ENCOUNTER — Telehealth: Payer: Self-pay | Admitting: *Deleted

## 2015-04-22 NOTE — Telephone Encounter (Signed)
Patient called call-a-nurse 04/20/2015 requesting letter from Dr. Caryn Section for more time out of work, due to cellulitis on legs. Patient is still having red spots and some swelling, but no fever. Please advise?

## 2015-04-23 NOTE — Telephone Encounter (Signed)
He needs to stay out of work until infection is completley cleared. Can write excuse for next week. Need o.v. Next Monday to see if he ready to go back to work.

## 2015-04-23 NOTE — Telephone Encounter (Signed)
Advised pt as directed below, pt verbalized fully understanding. Printed letter and placed on front desk ready for pt to pick up. Advised pt to call if any questions or concerns.     Thanks,

## 2015-04-23 NOTE — Telephone Encounter (Signed)
Patient called office stating that he went to work yesterday 04/22/15. While at work his leg started swelling back up. Patient stated that his leg was swollen, red and draining fluid. Patient said that he does not have a fever. Patient is still on antibiotic for another week. Patient is requesting advise about what he should do?

## 2015-04-27 IMAGING — CR DG THORACIC SPINE 2V
3 series · 3 of 3 positions shown · non-contrast
Comparison: Chest radiograph September 05, 2014

CLINICAL DATA: Patient fell onto back 1 day prior. Mid to lower
back pain

EXAM:
THORACIC SPINE - 3 VIEW

[t-spine ap]
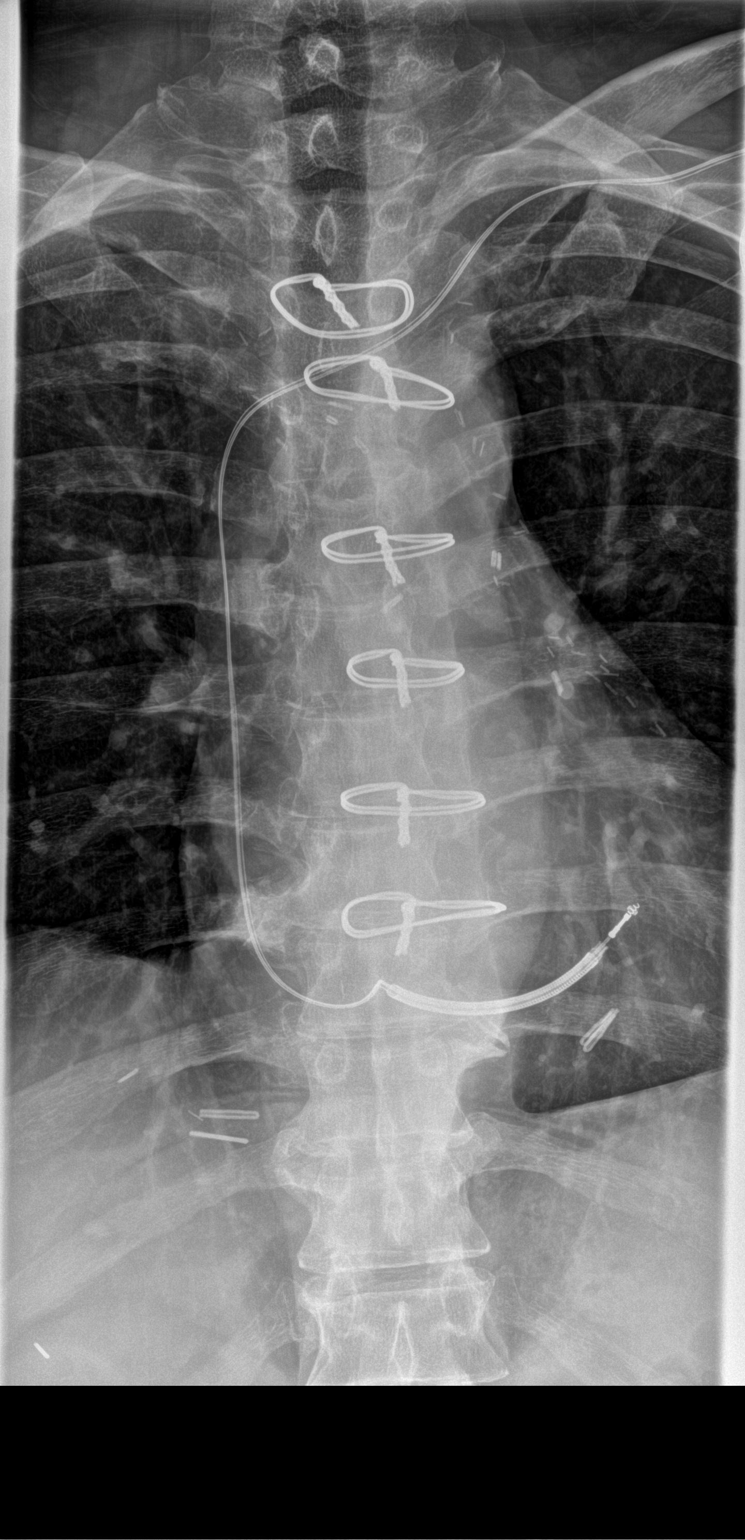

[t-spine lat]
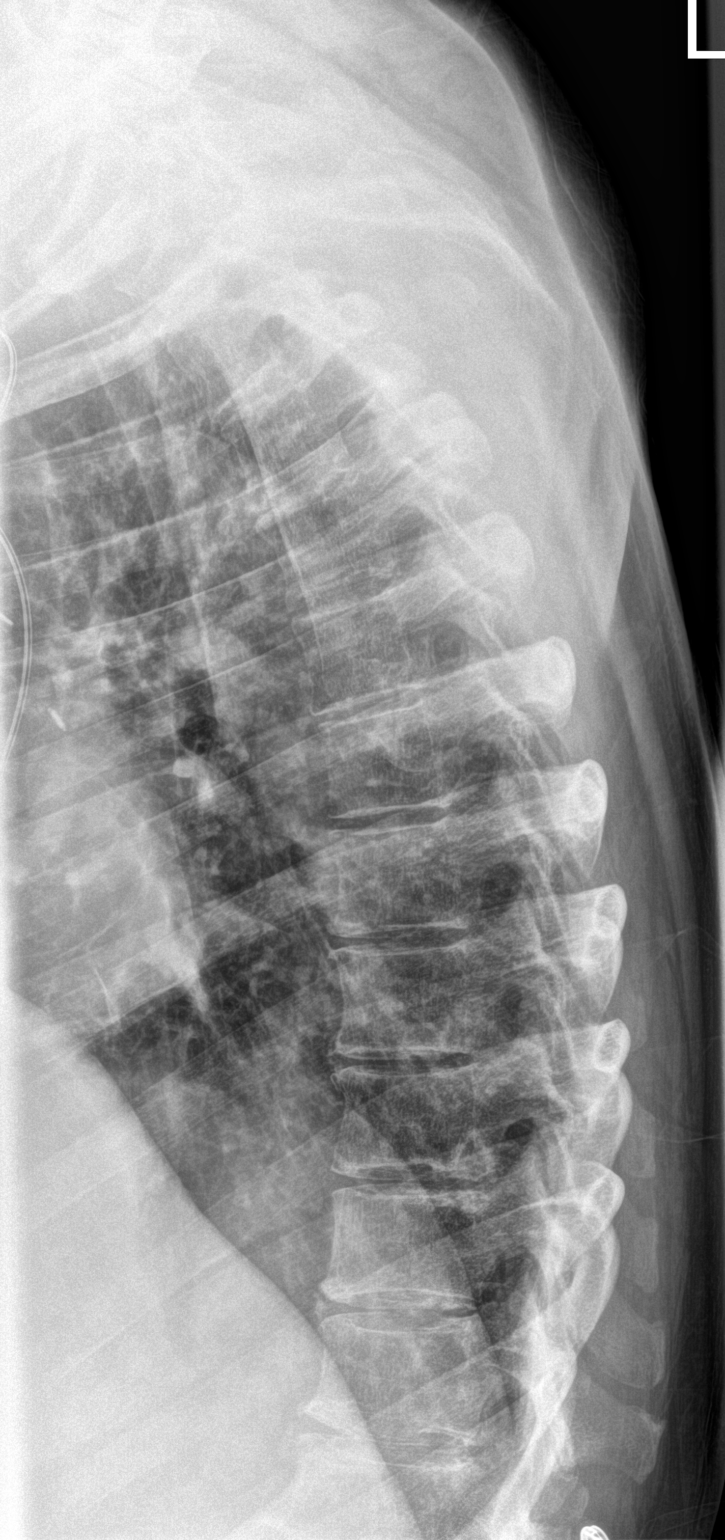

[t-spine swimmers]
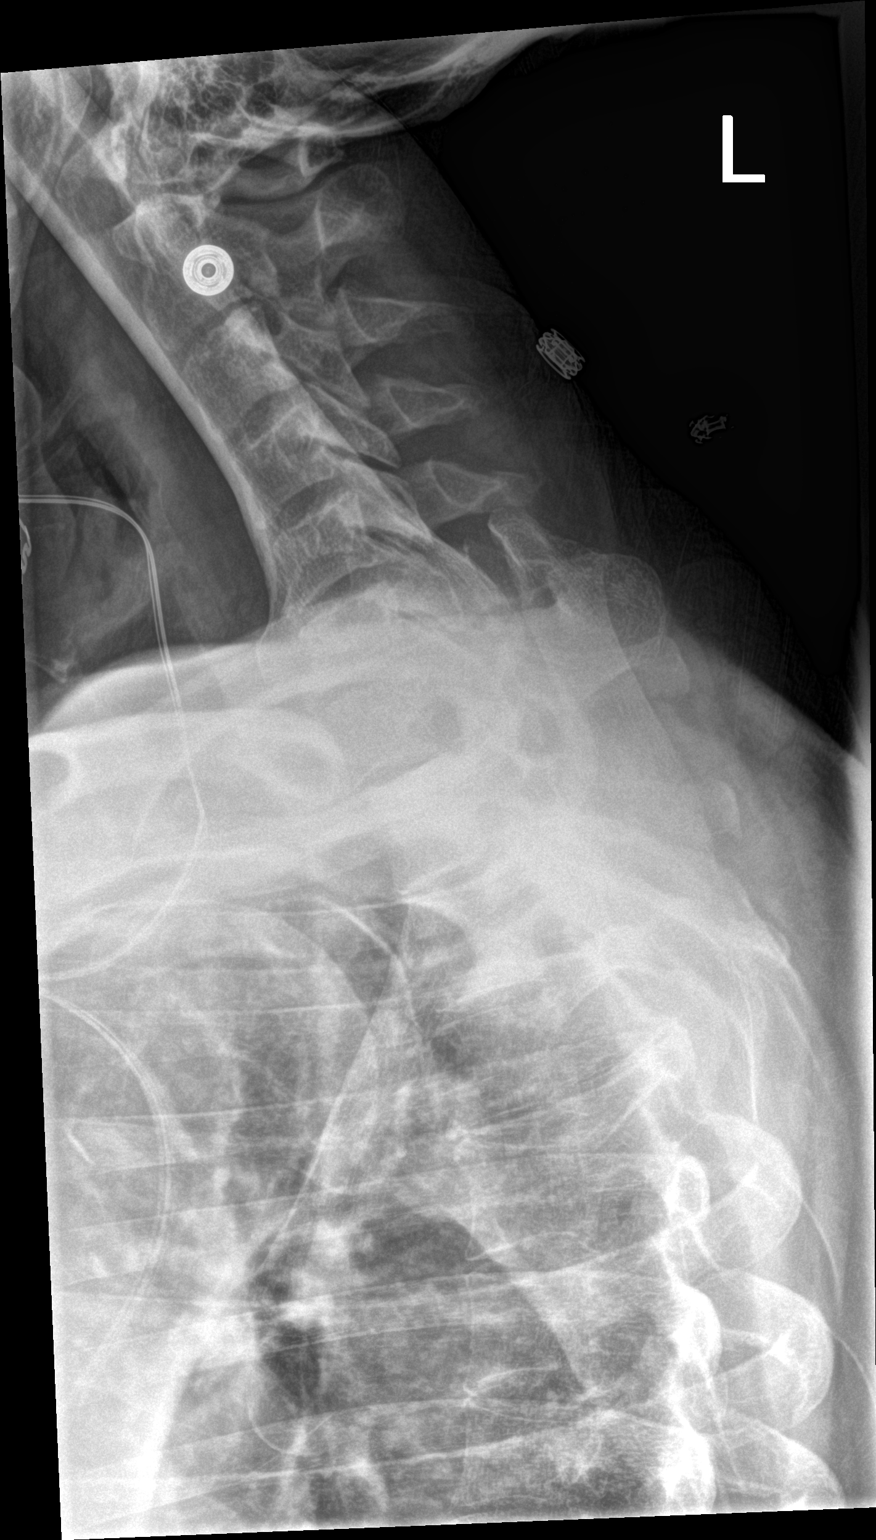

[3 of 3 positions shown; findings below may reference images not displayed]

FINDINGS: Frontal, lateral, and swimmer's views were obtained. There is no
fracture or spondylolisthesis. There is slight disc space narrowing
at several levels in the lower thoracic region. No erosive change.
IMPRESSION: Mild osteoarthritic change.  No fracture or spondylolisthesis.

## 2015-04-29 ENCOUNTER — Encounter: Payer: Self-pay | Admitting: Family Medicine

## 2015-04-29 ENCOUNTER — Ambulatory Visit (INDEPENDENT_AMBULATORY_CARE_PROVIDER_SITE_OTHER): Payer: Self-pay | Admitting: Family Medicine

## 2015-04-29 VITALS — BP 118/68 | HR 93 | Temp 97.8°F | Resp 16 | Wt 204.0 lb

## 2015-04-29 DIAGNOSIS — G4700 Insomnia, unspecified: Secondary | ICD-10-CM

## 2015-04-29 DIAGNOSIS — L03116 Cellulitis of left lower limb: Secondary | ICD-10-CM

## 2015-04-29 MED ORDER — CEPHALEXIN 500 MG PO CAPS: 500.0000 mg | ORAL_CAPSULE | Freq: Three times a day (TID) | ORAL | Status: DC

## 2015-04-29 MED ORDER — DOXYCYCLINE HYCLATE 100 MG PO TABS: 100.0000 mg | ORAL_TABLET | Freq: Two times a day (BID) | ORAL | Status: DC

## 2015-04-29 MED ORDER — LORAZEPAM 2 MG PO TABS: 1.0000 mg | ORAL_TABLET | Freq: Three times a day (TID) | ORAL | Status: DC | PRN

## 2015-04-29 NOTE — Progress Notes (Signed)
Patient: Richard Cook Male    DOB: [birth year]   59 y.o.   MRN: [id number] Visit Date: 04/29/2015  Today's Provider: Lelon Huh, MD   Chief Complaint  Patient presents with  . Cellulitis    follow up   Subjective:    HPI Cellulitis Recheck: Patient comes today for a follow up of Cellulitis of the left lower leg. Patient has been out of work for about 3 weeks and is here to asess if he is ready to return to work. Patient has been on oral antibiotics. He reports he finished the last dose of Doxycycline yesterday and will complete the last dose of Cephalexin today. Patient reports good compliance with treatment and fair tolerance. Patient states the last 4 days of taking the antibiotics his stomach has become upset with pain and heartburn.  Patient denies any swelling in his leg. He reports that his leg is purplish red in color. He states that it hurt to stand on his leg. There has been some itching of the skin on his left lower leg.  Patient has been applying cold compresses and using Cortisone cream.    Allergies  Allergen Reactions  . Codeine Nausea Only    Tolerates hydrocodone   Previous Medications   ALLOPURINOL (ZYLOPRIM) 300 MG TABLET    Take 300 mg by mouth daily.   ASPIRIN EC 81 MG TABLET    Take 81 mg by mouth daily.   ATORVASTATIN (LIPITOR) 20 MG TABLET    Take 20 mg by mouth daily.   CARVEDILOL (COREG) 6.25 MG TABLET    Take 6.25 mg by mouth 2 (two) times daily with a meal.   CEPHALEXIN (KEFLEX) 500 MG CAPSULE    Take 1 capsule (500 mg total) by mouth 3 (three) times daily.   CLONAZEPAM (KLONOPIN) 2 MG TABLET    Take 1/2-1 tablet by mouth 3 times a day as needed   COLCHICINE 0.6 MG TABLET    Take 1 tablet (0.6 mg total) by mouth daily.   DOXYCYCLINE (VIBRA-TABS) 100 MG TABLET    Take 1 tablet (100 mg total) by mouth 2 (two) times daily.   FUROSEMIDE (LASIX) 40 MG TABLET    Take 20 mg by mouth every other day.    HYDROCODONE-ACETAMINOPHEN (NORCO) 10-325 MG  PER TABLET    Take 1 tablet by mouth every 6 (six) hours as needed.   LISINOPRIL-HYDROCHLOROTHIAZIDE (PRINZIDE,ZESTORETIC) 10-12.5 MG PER TABLET    Take 1 tablet by mouth daily.    Review of Systems  Constitutional: Negative for fever, chills and appetite change.  Respiratory: Negative for chest tightness, shortness of breath and wheezing.   Cardiovascular: Negative for chest pain and palpitations.  Gastrointestinal: Negative for nausea, vomiting and abdominal pain.  Skin: Positive for color change and wound.    Social History  Substance Use Topics  . Smoking status: Never Smoker   . Smokeless tobacco: Never Used  . Alcohol Use: No   Objective:   BP 118/68 mmHg  Pulse 93  Temp(Src) 97.8 F (36.6 C) (Oral)  Resp 16  Wt 204 lb (92.534 kg)  SpO2 97%  Physical Exam  Diffuse dull redness of left anterior lower leg with patches of brighter erythema. Diffusely tender. Trace edema.     Assessment & Plan:     1. Cellulitis of left lower extremity Improving but not resolved.  - cephALEXin (KEFLEX) 500 MG capsule; Take 1 capsule (500 mg total) by mouth 3 (three)  times daily.  Dispense: 21 capsule; Refill: 0 - doxycycline (VIBRA-TABS) 100 MG tablet; Take 1 tablet (100 mg total) by mouth 2 (two) times daily.  Dispense: 14 tablet; Refill: 0  Work excuse through 05/01/2015 I have attempted to contact this patient by phone with the following results:  Call if symptoms change or if not rapidly improving.    2. Insomnia  He has taken clonazepam to help relax and fall asleep for many years, but states it is no longer working. He would like to change to different medication  He is to stop clonazepam and start lorazepam.  - LORazepam (ATIVAN) 2 MG tablet; Take 0.5-1 tablets (1-2 mg total) by mouth every 8 (eight) hours as needed for anxiety.  Dispense: 30 tablet; Refill: 1       Lelon Huh, MD  [facility]

## 2015-05-03 ENCOUNTER — Other Ambulatory Visit: Payer: Self-pay | Admitting: Family Medicine

## 2015-05-03 ENCOUNTER — Telehealth: Payer: Self-pay | Admitting: Family Medicine

## 2015-05-03 DIAGNOSIS — L03116 Cellulitis of left lower limb: Secondary | ICD-10-CM

## 2015-05-03 MED ORDER — CEPHALEXIN 500 MG PO CAPS: 500.0000 mg | ORAL_CAPSULE | Freq: Three times a day (TID) | ORAL | Status: DC

## 2015-05-03 MED ORDER — DOXYCYCLINE HYCLATE 100 MG PO TABS: 100.0000 mg | ORAL_TABLET | Freq: Two times a day (BID) | ORAL | Status: DC

## 2015-05-03 NOTE — Telephone Encounter (Signed)
Pt states the cellultits in his leg is looking better but still not gone away.  He has been marking it to see if the swelling is going down and cant tell that it has that much.  He is about to run out of medication Sunday and wants to know if you can refill the med or does he need for you to take another look at it.   Please advise [phone]  Thanks [name]

## 2015-05-03 NOTE — Telephone Encounter (Signed)
They should have refill for both doxycycline AND cephalexin

## 2015-05-03 NOTE — Telephone Encounter (Signed)
Patient reports that Doxy was supposed to be sent in. Did you want patient to be on this also? Thanks!

## 2015-05-03 NOTE — Telephone Encounter (Signed)
Please advise. Thanks.  

## 2015-05-03 NOTE — Telephone Encounter (Signed)
Have sent refill to his pharmacy. Call if not continuing to improve over the next week.

## 2015-05-04 ENCOUNTER — Other Ambulatory Visit: Payer: Self-pay | Admitting: Family Medicine

## 2015-05-04 DIAGNOSIS — L03116 Cellulitis of left lower limb: Secondary | ICD-10-CM

## 2015-05-04 MED ORDER — DOXYCYCLINE HYCLATE 100 MG PO TABS: 100.0000 mg | ORAL_TABLET | Freq: Two times a day (BID) | ORAL | Status: DC

## 2015-05-06 ENCOUNTER — Other Ambulatory Visit: Payer: Self-pay | Admitting: Family Medicine

## 2015-05-06 MED ORDER — HYDROCODONE-ACETAMINOPHEN 10-325 MG PO TABS: 1.0000 | ORAL_TABLET | Freq: Four times a day (QID) | ORAL | Status: DC | PRN

## 2015-05-06 NOTE — Telephone Encounter (Signed)
Pt contacted office for refill request on the following medications:  HYDROcodone-acetaminophen ([place]) 10-325 MG.  [id number]

## 2015-05-07 ENCOUNTER — Ambulatory Visit: Payer: Self-pay | Admitting: Family Medicine

## 2015-05-07 ENCOUNTER — Telehealth: Payer: Self-pay | Admitting: Family Medicine

## 2015-05-07 NOTE — Telephone Encounter (Signed)
Pt has been using the antibiotics and they have been causing stomach pain and diarrhea .  Please advise..  Call back is   [phone]  Hoyt Koch

## 2015-05-07 NOTE — Telephone Encounter (Signed)
Advised pt a directed below. Pt verbalized fully understanding.  Thanks,

## 2015-05-07 NOTE — Telephone Encounter (Signed)
GI probably from all the antibiotics. He should start taking OTC probiotics.

## 2015-05-07 NOTE — Telephone Encounter (Signed)
LMOVM for pt to return call 

## 2015-05-07 NOTE — Telephone Encounter (Signed)
Patient stated that he has been taking 2 probiotics qd, but they don't seem to be helping.

## 2015-05-07 NOTE — Telephone Encounter (Signed)
Take OTC Zantac 150, one tablet twice a day.

## 2015-05-07 NOTE — Telephone Encounter (Signed)
Called patient back. Patient stated that his legs have improved, only has a small spot left on his leg. Patient said that he has been having a lot of heartburn, stomachache, and some diarrhea. Patient has taken imodium and gas-x, but this medications are not helping. Patient wanted to know what else he can do?

## 2015-05-13 ENCOUNTER — Other Ambulatory Visit: Payer: Self-pay | Admitting: Family Medicine

## 2015-05-13 ENCOUNTER — Encounter: Payer: Self-pay | Admitting: Family Medicine

## 2015-05-13 ENCOUNTER — Ambulatory Visit (INDEPENDENT_AMBULATORY_CARE_PROVIDER_SITE_OTHER): Payer: Self-pay | Admitting: Family Medicine

## 2015-05-13 VITALS — BP 122/88 | HR 85 | Temp 98.3°F | Resp 16 | Ht 70.0 in | Wt 206.0 lb

## 2015-05-13 DIAGNOSIS — L03116 Cellulitis of left lower limb: Secondary | ICD-10-CM

## 2015-05-13 DIAGNOSIS — G4700 Insomnia, unspecified: Secondary | ICD-10-CM

## 2015-05-13 MED ORDER — DOXYCYCLINE HYCLATE 100 MG PO TABS: 100.0000 mg | ORAL_TABLET | Freq: Two times a day (BID) | ORAL | Status: DC

## 2015-05-13 MED ORDER — CEPHALEXIN 500 MG PO CAPS: 500.0000 mg | ORAL_CAPSULE | Freq: Three times a day (TID) | ORAL | Status: DC

## 2015-05-13 MED ORDER — ZOLPIDEM TARTRATE 10 MG PO TABS: 5.0000 mg | ORAL_TABLET | Freq: Every evening | ORAL | Status: DC | PRN

## 2015-05-13 NOTE — Progress Notes (Signed)
Patient: Richard Cook Male    DOB: 06/24/1956   59 y.o.   MRN: [id number] Visit Date: 05/13/2015  Today's Provider: Lelon Huh, MD   Chief Complaint  Patient presents with  . Cellulitis    recheck   Subjective:    HPI  Cellulitis Recheck: Patient was last seen 04/29/2015 for cellulitis of left lower leg and was prescribed Keflex and Doxycycline. Patient reports good compliance with treatment, fair tolerance (caused some diarrhea, now resolved) and fair symptom control. Patient states he has one more day of the Keflex to take. Patient reports the Cellulitis is not completely resolved. He has some redness and itchiness of the left lower leg. He also has some pain in the leg whenever walking or standing. Patient denies any swelling in his legs.  He has been able to sit and elevate leg more the last couple of week at work, whereas he was previously standing in feet for prolonged periods of time  Insomnia. He states he has been taking a long time to fall asleep lately and only sleeps 3 or 4 hours once he gets to sleep. He has been taking lorazepam at night which helps with anxiety, but does not seem to make him sleepy.      Allergies  Allergen Reactions  . Codeine Nausea Only    Tolerates hydrocodone   Previous Medications   ALLOPURINOL (ZYLOPRIM) 300 MG TABLET    Take 300 mg by mouth daily.   ASPIRIN EC 81 MG TABLET    Take 81 mg by mouth daily.   ATORVASTATIN (LIPITOR) 20 MG TABLET    Take 20 mg by mouth daily.   CARVEDILOL (COREG) 6.25 MG TABLET    Take 6.25 mg by mouth 2 (two) times daily with a meal.   COLCHICINE 0.6 MG TABLET    Take 1 tablet (0.6 mg total) by mouth daily.   FUROSEMIDE (LASIX) 40 MG TABLET    Take 20 mg by mouth every other day.    HYDROCODONE-ACETAMINOPHEN (NORCO) 10-325 MG TABLET    Take 1 tablet by mouth every 6 (six) hours as needed for moderate pain.   LISINOPRIL-HYDROCHLOROTHIAZIDE (PRINZIDE,ZESTORETIC) 10-12.5 MG PER TABLET    Take 1  tablet by mouth daily.   LORAZEPAM (ATIVAN) 2 MG TABLET    Take 0.5-1 tablets (1-2 mg total) by mouth every 8 (eight) hours as needed for anxiety.    Review of Systems  Constitutional: Negative for fever, chills and appetite change.  Respiratory: Negative for chest tightness, shortness of breath and wheezing.   Cardiovascular: Negative for chest pain and palpitations.  Gastrointestinal: Negative for nausea, vomiting and abdominal pain.  Skin: Positive for color change.       Itching of left lower leg    Social History  Substance Use Topics  . Smoking status: Never Smoker   . Smokeless tobacco: Never Used  . Alcohol Use: No   Objective:   BP 122/88 mmHg  Pulse 85  Temp(Src) 98.3 F (36.8 C) (Oral)  Resp 16  Ht 5\' 10"  (1.778 m)  Wt 206 lb (93.441 kg)  BMI 29.56 kg/m2  SpO2 95%  Physical Exam  Diffuse dull redness of left anterior lower leg which blanches upon leg elevation. Diffusely mildly tender. Trace edema.     Assessment & Plan:     1. Cellulitis of left lower extremity Slowly improving. Continue antibiotics.  - cephALEXin (KEFLEX) 500 MG capsule; Take 1 capsule (500 mg total) by  mouth 3 (three) times daily.  Dispense: 30 capsule; Refill: 0 - doxycycline (VIBRA-TABS) 100 MG tablet; Take 1 tablet (100 mg total) by mouth 2 (two) times daily.  Dispense: 20 tablet; Refill: 0  2. Insomnia Change from lorazepam to Ambien.  - zolpidem (AMBIEN) 10 MG tablet; Take 0.5-1 tablets (5-10 mg total) by mouth at bedtime as needed for sleep.  Dispense: 30 tablet; Refill: 1       Lelon Huh, MD  [facility]

## 2015-05-18 ENCOUNTER — Other Ambulatory Visit: Payer: Self-pay | Admitting: Family Medicine

## 2015-05-21 ENCOUNTER — Telehealth: Payer: Self-pay

## 2015-05-21 NOTE — Telephone Encounter (Signed)
LMOVM  For pt to return call.

## 2015-05-21 NOTE — Telephone Encounter (Signed)
Patient called wanting to let Dr. Caryn Section know that when he woke up yesterday morning the red areas on his leg had turned brown. He states the leg looks better and seems to be healing some. Patient wants to know if he needs another round of antibiotics? Patient has 2 more days of Doxycycline and 3 more days of Cephalexin left. Patient uses the [facility] in [place] listed in his chart.

## 2015-05-21 NOTE — Telephone Encounter (Signed)
Patient advised and verbally voiced understanding.  

## 2015-05-21 NOTE — Telephone Encounter (Signed)
It is normal for skin to darken while it is healing, but this is not a sign of infection. If there is any redness in leg after he finishes antibiotics than he can call fo refill.

## 2015-05-22 ENCOUNTER — Other Ambulatory Visit: Payer: Self-pay | Admitting: Family Medicine

## 2015-05-22 DIAGNOSIS — L03116 Cellulitis of left lower limb: Secondary | ICD-10-CM

## 2015-05-22 MED ORDER — DOXYCYCLINE HYCLATE 100 MG PO TABS: 100.0000 mg | ORAL_TABLET | Freq: Two times a day (BID) | ORAL | Status: DC

## 2015-05-22 MED ORDER — CEPHALEXIN 500 MG PO CAPS: 500.0000 mg | ORAL_CAPSULE | Freq: Three times a day (TID) | ORAL | Status: DC

## 2015-05-22 NOTE — Telephone Encounter (Signed)
Pt states Sunday and Monday is left leg was turning brown.  Pt states last night when he took his sock off his leg has a place that has turned red and feels warm.  Pt is asking if he can get a refill for antibiotics.  Pt states he only has 1 more day of antibiotics left.   [facility].  [id number]

## 2015-05-22 NOTE — Telephone Encounter (Signed)
done

## 2015-05-28 ENCOUNTER — Other Ambulatory Visit: Payer: Self-pay | Admitting: Family Medicine

## 2015-05-28 MED ORDER — CLONAZEPAM 2 MG PO TABS: ORAL_TABLET | ORAL | Status: DC

## 2015-05-28 NOTE — Telephone Encounter (Signed)
Pt stated that the Lorazepam nor the Ambien are not working for him.he stated that he is not sleeping well and that he is has a lot of anxiety. He is requesting Clonazepam, he stated that he has taken this medication before. allscripts shows Clonazepam 2 mg 1/2 to 1 tablet 3 times a day PRN as a previous medication. He also stated that he has an ov this coming Friday.  Thanks,

## 2015-05-28 NOTE — Telephone Encounter (Signed)
Rx was phoned into pharmacy. 

## 2015-05-28 NOTE — Telephone Encounter (Signed)
Pt states he rec'd a new Rx for anxiety but it is not helping.  Pt is requesting a Rx for something different if possible.  Pt has an appointment on Friday.  [facility].  [id number]

## 2015-05-28 NOTE — Telephone Encounter (Signed)
Please call in rx for clonazepam 2mg . 1/2-1 tablet at bedtime as needed, #30, rf x 1 Please have pharmacy cancel refill on file for zolpidem

## 2015-05-31 ENCOUNTER — Ambulatory Visit (INDEPENDENT_AMBULATORY_CARE_PROVIDER_SITE_OTHER): Payer: Self-pay | Admitting: Family Medicine

## 2015-05-31 ENCOUNTER — Encounter: Payer: Self-pay | Admitting: Family Medicine

## 2015-05-31 VITALS — BP 142/90 | HR 76 | Temp 98.6°F | Resp 16 | Wt 205.0 lb

## 2015-05-31 DIAGNOSIS — L03116 Cellulitis of left lower limb: Secondary | ICD-10-CM

## 2015-05-31 DIAGNOSIS — F411 Generalized anxiety disorder: Secondary | ICD-10-CM

## 2015-05-31 MED ORDER — CEPHALEXIN 500 MG PO CAPS: 500.0000 mg | ORAL_CAPSULE | Freq: Three times a day (TID) | ORAL | Status: AC

## 2015-05-31 MED ORDER — DOXYCYCLINE HYCLATE 100 MG PO TABS: 100.0000 mg | ORAL_TABLET | Freq: Two times a day (BID) | ORAL | Status: AC

## 2015-05-31 MED ORDER — CLONAZEPAM 2 MG PO TABS: 1.0000 mg | ORAL_TABLET | Freq: Three times a day (TID) | ORAL | Status: DC | PRN

## 2015-05-31 NOTE — Progress Notes (Signed)
Patient: Richard Cook Male    DOB: [birth year]   59 y.o.   MRN: [id number] Visit Date: 05/31/2015  Today's Provider: Lelon Huh, MD   Chief Complaint  Patient presents with  . Cellulitis    follow up   Subjective:    HPI  Cellulitis Recheck: Patient comes in today for a recheck of Cellulitis of lower left leg. Patient is currently taking Cephalexin and Doxycycline which he has been taking since discharge in September. Patient reports good compliance, fair tolerance and fair symptom control.Patient denies any pain in his leg. No fevers. Redness is consistently retreating, but still about 5x5cm area or erythema on anterior leg. He is trying to keep leg elevated whenever possible   Anxiety: He also states he is having anxiety during the day for which he used to take clonazepam. Rx was changed to take only at night, but he state anxiety has since gotten worse and he would like to go back to TID dosing.    Allergies  Allergen Reactions  . Codeine Nausea Only    Tolerates hydrocodone   Previous Medications   ALLOPURINOL (ZYLOPRIM) 300 MG TABLET    Take 300 mg by mouth daily.   ASPIRIN EC 81 MG TABLET    Take 81 mg by mouth daily.   ATORVASTATIN (LIPITOR) 20 MG TABLET    Take 20 mg by mouth daily.   CARVEDILOL (COREG) 6.25 MG TABLET    TAKE ONE TABLET BY MOUTH EVERY 12 HOURS   CEPHALEXIN (KEFLEX) 500 MG CAPSULE    Take 1 capsule (500 mg total) by mouth 3 (three) times daily.   CLONAZEPAM (KLONOPIN) 2 MG TABLET    Take 1/2 to 1 tablet at bedtime as needed   COLCHICINE 0.6 MG TABLET    Take 1 tablet (0.6 mg total) by mouth daily.   DOXYCYCLINE (VIBRA-TABS) 100 MG TABLET    Take 1 tablet (100 mg total) by mouth 2 (two) times daily.   FUROSEMIDE (LASIX) 40 MG TABLET    Take 20 mg by mouth every other day.    HYDROCODONE-ACETAMINOPHEN (NORCO) 10-325 MG TABLET    Take 1 tablet by mouth every 6 (six) hours as needed for moderate pain.   LISINOPRIL-HYDROCHLOROTHIAZIDE  (PRINZIDE,ZESTORETIC) 10-12.5 MG PER TABLET    Take 1 tablet by mouth daily.    Review of Systems  Constitutional: Negative for fever, chills and appetite change.  Respiratory: Negative for chest tightness, shortness of breath and wheezing.   Cardiovascular: Negative for chest pain and palpitations.  Gastrointestinal: Negative for nausea, vomiting and abdominal pain.  Musculoskeletal: Negative for myalgias.  Skin: Positive for color change.    Social History  Substance Use Topics  . Smoking status: Never Smoker   . Smokeless tobacco: Never Used  . Alcohol Use: No   Objective:   BP 142/90 mmHg  Pulse 76  Temp(Src) 98.6 F (37 C) (Oral)  Resp 16  Wt 205 lb (92.987 kg)  Physical Exam  Derm: Erythematous left anterior leg as per HPI.     Assessment & Plan:     1. Cellulitis of left lower extremity Steadily improving, but not resolved. Continue current abx additional 10 days - cephALEXin (KEFLEX) 500 MG capsule; Take 1 capsule (500 mg total) by mouth 3 (three) times daily.  Dispense: 30 capsule; Refill: 0 - doxycycline (VIBRA-TABS) 100 MG tablet; Take 1 tablet (100 mg total) by mouth 2 (two) times daily.  Dispense: 20 tablet; Refill: 0  2. Anxiety state Did well on TID clonazepam in the past.  - clonazePAM (KLONOPIN) 2 MG tablet; Take 0.5-1 tablets (1-2 mg total) by mouth 3 (three) times daily as needed for anxiety.  Dispense: 90 tablet; Refill: [place], MD  [facility]

## 2015-06-06 ENCOUNTER — Other Ambulatory Visit: Payer: Self-pay | Admitting: Family Medicine

## 2015-06-06 MED ORDER — HYDROCODONE-ACETAMINOPHEN 10-325 MG PO TABS: 1.0000 | ORAL_TABLET | Freq: Four times a day (QID) | ORAL | Status: DC | PRN

## 2015-06-06 NOTE — Telephone Encounter (Signed)
Pt contacted office for refill request on the following medications:  HYDROcodone-acetaminophen ([place]) 10-325 MG.  [id number]

## 2015-06-13 ENCOUNTER — Telehealth: Payer: Self-pay | Admitting: *Deleted

## 2015-06-13 DIAGNOSIS — L0390 Cellulitis, unspecified: Secondary | ICD-10-CM

## 2015-06-13 MED ORDER — CEPHALEXIN 500 MG PO TABS: 500.0000 mg | ORAL_TABLET | Freq: Three times a day (TID) | ORAL | Status: DC

## 2015-06-13 NOTE — Telephone Encounter (Signed)
Sent abx into pharmacy. Patient advised.

## 2015-06-13 NOTE — Telephone Encounter (Signed)
Patient called office stating that he still has a spot on his shin that is 3 x1 1/2 inches. The spot is still red. Patient wants to know if he can continue with another round of antibiotics or come in for an ov?

## 2015-06-13 NOTE — Telephone Encounter (Signed)
Please send in another round of cephalexin 500mg  tid for ten days. Thanks.

## 2015-06-17 ENCOUNTER — Telehealth: Payer: Self-pay | Admitting: Family Medicine

## 2015-06-17 MED ORDER — DOXYCYCLINE HYCLATE 100 MG PO TABS: 100.0000 mg | ORAL_TABLET | Freq: Two times a day (BID) | ORAL | Status: DC

## 2015-06-17 NOTE — Telephone Encounter (Signed)
Patient advised.

## 2015-06-17 NOTE — Telephone Encounter (Signed)
Have sent refill doxycycline

## 2015-06-17 NOTE — Telephone Encounter (Signed)
Advised patient that only keflex was sent into the pharmacy. Patient reports that he is still having symptoms of cellulitis. Patient doesn't think that his cellulitis is getting any better since it appeared about 1 month ago. Patient is becoming worried that we may be using the wrong abx. Recommended that patient continue medication (keflex) due to him only starting his first dose yesterday, and to advise if he has any side effects. If he is still having symptoms, I recommended that he be seen in the office for further evaluation. Is that ok? Please advise. Thanks!

## 2015-06-17 NOTE — Telephone Encounter (Signed)
Pt called saying he thought he was supposed to get two Rx's when he last RX was called in.  He thought he was suppose to get doxycyline also.  I do not see where it has been called in before.  Please contact patient for more info.  His call back is  [phone]  Thanks [name]

## 2015-06-25 ENCOUNTER — Other Ambulatory Visit: Payer: Self-pay | Admitting: Family Medicine

## 2015-06-25 DIAGNOSIS — L0390 Cellulitis, unspecified: Secondary | ICD-10-CM

## 2015-06-25 MED ORDER — ALLOPURINOL 300 MG PO TABS: 300.0000 mg | ORAL_TABLET | Freq: Every day | ORAL | Status: DC

## 2015-06-25 MED ORDER — CEPHALEXIN 500 MG PO TABS: 500.0000 mg | ORAL_TABLET | Freq: Three times a day (TID) | ORAL | Status: DC

## 2015-06-25 NOTE — Telephone Encounter (Signed)
Needs refills allopurinol (ZYLOPRIM) 300 MG tablet  and Cephalexin 500 MG tablet 06/13/15 -- Birdie Sons, MD Take 1 tablet (500 mg total) by mouth 3 (three) times daily.  CB [phone]   He uses [facility] [address] in [place].  Thanks [name]

## 2015-07-01 ENCOUNTER — Encounter: Payer: Self-pay | Admitting: Family Medicine

## 2015-07-01 ENCOUNTER — Ambulatory Visit (INDEPENDENT_AMBULATORY_CARE_PROVIDER_SITE_OTHER): Payer: Self-pay | Admitting: Family Medicine

## 2015-07-01 VITALS — BP 102/78 | HR 82 | Temp 97.8°F | Resp 16

## 2015-07-01 DIAGNOSIS — M5489 Other dorsalgia: Secondary | ICD-10-CM

## 2015-07-01 DIAGNOSIS — L0390 Cellulitis, unspecified: Secondary | ICD-10-CM

## 2015-07-01 MED ORDER — HYDROCODONE-ACETAMINOPHEN 10-325 MG PO TABS: 1.0000 | ORAL_TABLET | Freq: Four times a day (QID) | ORAL | Status: DC | PRN

## 2015-07-01 NOTE — Progress Notes (Signed)
       Patient: Richard Cook Male    DOB: 1955/11/21   59 y.o.   MRN: [id number] Visit Date: 07/01/2015  Today's Provider: Lelon Huh, MD   Chief Complaint  Patient presents with  . Cellulitis    recheck   Subjective:    HPI  Cellulitis Recheck: Patient is here to have his left leg rechecked. Patient has been on several rounds of doxycyline and cephalexin over the past 3 months. Leg is no longer painful, but swells and turns red when he puts his feet on the ground for extended periods. Has had no fevers recently.      Allergies  Allergen Reactions  . Codeine Nausea Only    Tolerates hydrocodone   Previous Medications   ALLOPURINOL (ZYLOPRIM) 300 MG TABLET    Take 1 tablet (300 mg total) by mouth daily.   ASPIRIN EC 81 MG TABLET    Take 81 mg by mouth daily.   ATORVASTATIN (LIPITOR) 20 MG TABLET    Take 20 mg by mouth daily.   CARVEDILOL (COREG) 6.25 MG TABLET    TAKE ONE TABLET BY MOUTH EVERY 12 HOURS   CEPHALEXIN 500 MG TABLET    Take 1 tablet (500 mg total) by mouth 3 (three) times daily.   CLONAZEPAM (KLONOPIN) 2 MG TABLET    Take 0.5-1 tablets (1-2 mg total) by mouth 3 (three) times daily as needed for anxiety.   COLCHICINE 0.6 MG TABLET    Take 1 tablet (0.6 mg total) by mouth daily.   DOXYCYCLINE (VIBRA-TABS) 100 MG TABLET    Take 1 tablet (100 mg total) by mouth 2 (two) times daily.   FUROSEMIDE (LASIX) 40 MG TABLET    Take 20 mg by mouth every other day.    HYDROCODONE-ACETAMINOPHEN (NORCO) 10-325 MG TABLET    Take 1 tablet by mouth every 6 (six) hours as needed for moderate pain.   LISINOPRIL-HYDROCHLOROTHIAZIDE (PRINZIDE,ZESTORETIC) 10-12.5 MG PER TABLET    Take 1 tablet by mouth daily.    Review of Systems  Constitutional: Negative for fever, chills and appetite change.  Respiratory: Negative for chest tightness, shortness of breath and wheezing.   Cardiovascular: Negative for chest pain and palpitations.  Gastrointestinal: Negative for nausea,  vomiting and abdominal pain.  Skin: Positive for color change.    Social History  Substance Use Topics  . Smoking status: Never Smoker   . Smokeless tobacco: Never Used  . Alcohol Use: No   Objective:   BP 102/78 mmHg  Pulse 82  Temp(Src) 97.8 F (36.6 C) (Oral)  Resp 16  SpO2 96%  Physical Exam  Ext: Anterior aspect of left lower leg with blanching erythema, returns to flesh color when he elevates leg to level of heart. Not tender at all, no drainage, no edema.      Assessment & Plan:     1. Cellulitis, unspecified cellulitis site, unspecified extremity site, unspecified laterality Resolved. Advised to call if he develops any pain, tenderness, drainage or persistent erythema of leg.   2. Back pain without sciatica Controlled on current pain medications. Refill pain medication today.  - HYDROcodone-acetaminophen ([place]) 10-325 MG tablet; Take 1 tablet by mouth every 6 (six) hours as needed for moderate pain.  Dispense: 60 tablet; Refill: 0       Lelon Huh, MD  [facility]

## 2015-07-30 ENCOUNTER — Other Ambulatory Visit: Payer: Self-pay | Admitting: Family Medicine

## 2015-07-30 DIAGNOSIS — M5489 Other dorsalgia: Secondary | ICD-10-CM

## 2015-07-30 NOTE — Telephone Encounter (Signed)
Pt contacted office for refill request on the following medications:  HYDROcodone-acetaminophen (NORCO) 10-325 MG tablet.  CB#[phone]/MW °

## 2015-07-31 MED ORDER — HYDROCODONE-ACETAMINOPHEN 10-325 MG PO TABS: 1.0000 | ORAL_TABLET | Freq: Four times a day (QID) | ORAL | Status: DC | PRN

## 2015-08-16 ENCOUNTER — Other Ambulatory Visit: Payer: Self-pay

## 2015-08-16 MED ORDER — COLCHICINE 0.6 MG PO TABS: ORAL_TABLET | ORAL | Status: DC

## 2015-08-16 NOTE — Telephone Encounter (Signed)
Patient stated that he is pretty sure it was colchcine.

## 2015-08-16 NOTE — Telephone Encounter (Signed)
Pt called to get an update b/c the pharmacy advised they haven't received RX for pt's gout. Pt request it be sent to [facility] in [place] today. Thanks TNP

## 2015-08-16 NOTE — Telephone Encounter (Signed)
rx for colchicine has been sent to [facility] [address]

## 2015-08-16 NOTE — Telephone Encounter (Signed)
Pt called [facility] and they tol him it would be 136.00 He said that was too much.  Can you call in something else.  His call  [phone]  Thanks, [name]

## 2015-08-16 NOTE — Telephone Encounter (Signed)
He was prescribed colchicine when he was hospitalized in September. Is this the one that worked for him?

## 2015-08-16 NOTE — Telephone Encounter (Signed)
Called patient and unable to leave VM. Will try again later.

## 2015-08-16 NOTE — Telephone Encounter (Signed)
Patient called stating that he is 99.9% sure he has a gout flare up in his right knee. Patient states he has had pain and swelling in his right knee for 3 days. Patient states it hurts to walk on that leg. Patient says that he was prescribed a medication for Gout in the past which gave him diarrhea and he had to be switched to a different gout medication. Patient is not sure what the name of this medication is but is requesting a refill. [facility] uses [facility] on [address] in [place].

## 2015-08-19 MED ORDER — INDOMETHACIN 50 MG PO CAPS: ORAL_CAPSULE | ORAL | Status: DC

## 2015-08-19 NOTE — Telephone Encounter (Signed)
He can change to indomethacin 50mg  one every eight hours as needed if he was not able to get the colchicine filled. Quantity of 30 with 1 refill.

## 2015-08-19 NOTE — Telephone Encounter (Signed)
Patient notified

## 2015-08-23 ENCOUNTER — Telehealth: Payer: Self-pay | Admitting: Internal Medicine

## 2015-08-23 NOTE — Telephone Encounter (Signed)
New message     Patient calling in to make an appt with Dr.Klein - recall from late Feb 28 . appt made for 3/20 . Patient verbalized I could die before then . Patient starting verbalized he has been having some problem.    C/O no energy ,does not feel well, sleeping a lot.    No chest pain No sob

## 2015-08-23 NOTE — Telephone Encounter (Signed)
Appointment scheduled for Wednesday, 08/28/2015 at 10:00 with Tommye Standard, [place] office.    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   Patient made aware & verbalized understanding.

## 2015-08-23 NOTE — Telephone Encounter (Signed)
Chest pains a few weeks ago.  Has not had any since.  Does not have NTG at home.  No energy.  Been going on x 3 weeks. Gradually getting worse.  No SOB.  Is worse with activity.  Feels fine first thing in the morning, but by end of day exhausted.  Heart rate staying around 88 or 89.  States BP running low, but could not provide numbers.    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

## 2015-08-23 NOTE — Telephone Encounter (Signed)
Left message to return call 

## 2015-08-28 ENCOUNTER — Encounter: Payer: Self-pay | Admitting: Physician Assistant

## 2015-09-02 ENCOUNTER — Ambulatory Visit: Payer: Self-pay | Admitting: Family Medicine

## 2015-09-02 ENCOUNTER — Other Ambulatory Visit: Payer: Self-pay | Admitting: Family Medicine

## 2015-09-02 DIAGNOSIS — M5489 Other dorsalgia: Secondary | ICD-10-CM

## 2015-09-02 MED ORDER — HYDROCODONE-ACETAMINOPHEN 10-325 MG PO TABS: 1.0000 | ORAL_TABLET | Freq: Four times a day (QID) | ORAL | Status: DC | PRN

## 2015-09-02 NOTE — Telephone Encounter (Signed)
Pt contacted office for refill request on the following medications: HYDROcodone-acetaminophen ([place]) 10-325 MG tablet. Thanks TNP

## 2015-09-06 ENCOUNTER — Other Ambulatory Visit: Payer: Self-pay | Admitting: Nurse Practitioner

## 2015-09-17 ENCOUNTER — Encounter: Payer: Self-pay | Admitting: Family Medicine

## 2015-09-17 ENCOUNTER — Ambulatory Visit (INDEPENDENT_AMBULATORY_CARE_PROVIDER_SITE_OTHER): Payer: Self-pay | Admitting: Family Medicine

## 2015-09-17 VITALS — BP 110/80 | HR 70 | Temp 98.1°F | Resp 16 | Wt 207.0 lb

## 2015-09-17 DIAGNOSIS — K5900 Constipation, unspecified: Secondary | ICD-10-CM

## 2015-09-17 MED ORDER — LACTULOSE 10 GM/15ML PO SOLN: ORAL | Status: DC

## 2015-09-17 NOTE — Progress Notes (Signed)
Patient: Richard Cook Male    DOB: [birth year]   60 y.o.   MRN: [id number] Visit Date: 09/17/2015  Today's Provider: Lelon Huh, MD   Chief Complaint  Patient presents with  . Constipation   Subjective:    Constipation This is a new problem. Episode onset: 2 weeks ago. The problem has been gradually worsening since onset. His stool frequency is 1 time per week or less. The stool is described as pellet like. The patient is on a high fiber diet. He exercises regularly. There has been adequate water intake. Associated symptoms include abdominal pain and bloating. Pertinent negatives include no back pain, diarrhea, difficulty urinating, fecal incontinence, fever, flatus, hemorrhoids, nausea, rectal pain, vomiting or weight loss. He has tried fiber and stool softeners for the symptoms. The treatment provided no relief.  Patient states he has only had 1 bowel movement in the last 2 weeks. Patient reports his last bowel movement was 2 days ago. The stool consistency was small hard pellets. Patient states after wiping he saw blood. Patient has had to strain during bowel movements.   Has been taking Bene fiber twice a day for the last week.    Has been taking more hydrocodone the last couple of weeks due to back pain. Taking about 3 tablets most days.     Allergies  Allergen Reactions  . Codeine Nausea Only    Tolerates hydrocodone   Previous Medications   ALLOPURINOL (ZYLOPRIM) 300 MG TABLET    Take 1 tablet (300 mg total) by mouth daily.   ASPIRIN EC 81 MG TABLET    Take 81 mg by mouth daily.   ATORVASTATIN (LIPITOR) 20 MG TABLET    Take 20 mg by mouth daily.   CARVEDILOL (COREG) 6.25 MG TABLET    TAKE ONE TABLET BY MOUTH EVERY 12 HOURS   CLONAZEPAM (KLONOPIN) 2 MG TABLET    Take 0.5-1 tablets (1-2 mg total) by mouth 3 (three) times daily as needed for anxiety.   COLCHICINE 0.6 MG TABLET    Take two tablets today, then one tablet daily until gone.   DOXYCYCLINE  (VIBRA-TABS) 100 MG TABLET    Take 1 tablet (100 mg total) by mouth 2 (two) times daily.   FUROSEMIDE (LASIX) 40 MG TABLET    Take 20 mg by mouth every other day.    FUROSEMIDE (LASIX) 40 MG TABLET    TAKE ONE TABLET BY MOUTH ONCE DAILY   HYDROCODONE-ACETAMINOPHEN (NORCO) 10-325 MG TABLET    Take 1 tablet by mouth every 6 (six) hours as needed for moderate pain.   INDOMETHACIN (INDOCIN) 50 MG CAPSULE    Take One Capsule By Mouth Every Eight Hours As Needed   LISINOPRIL-HYDROCHLOROTHIAZIDE (PRINZIDE,ZESTORETIC) 10-12.5 MG PER TABLET    Take 1 tablet by mouth daily.    Review of Systems  Constitutional: Negative for fever, chills, weight loss and appetite change.  Respiratory: Negative for chest tightness, shortness of breath and wheezing.   Cardiovascular: Negative for chest pain and palpitations.  Gastrointestinal: Positive for abdominal pain, constipation and bloating. Negative for nausea, vomiting, diarrhea, rectal pain, flatus and hemorrhoids.  Genitourinary: Negative for difficulty urinating.  Musculoskeletal: Negative for back pain.    Social History  Substance Use Topics  . Smoking status: Never Smoker   . Smokeless tobacco: Never Used  . Alcohol Use: No   Objective:   BP 110/80 mmHg  Pulse 70  Temp(Src) 98.1 F (36.7 C) (Oral)  Resp  16  Wt 207 lb (93.895 kg)  SpO2 94%  Physical Exam  General Appearance:    Alert, cooperative, no distress  Eyes:    PERRL, conjunctiva/corneas clear, EOM's intact       Lungs:     Clear to auscultation bilaterally, respirations unlabored  Heart:    Regular rate and rhythm  Abdomen:   bowel sounds present and normal in all 4 quadrants. No CVA tenderness         Assessment & Plan:     1. Constipation, unspecified constipation type Likely exacerbated by increase opioid use.  Patient Instructions   Use OTC Miralax every day until having normal bowel movements.   Continue Bene Fiber every day  Add prescription lactulose if you have  not had a normal bowel movement within three days   - lactulose (CHRONULAC) 10 GM/15ML solution; Take 2 Tablespoons up to three times daily as needed for constipation  Dispense: 240 mL; Refill: 0   Call if symptoms change or if not rapidly improving.           Lelon Huh, MD  [facility]

## 2015-09-17 NOTE — Patient Instructions (Addendum)
   Use OTC Miralax every day until having normal bowel movements.   Continue Bene Fiber every day  Add prescription lactulose if you have not had a normal bowel movement within three days

## 2015-09-19 ENCOUNTER — Telehealth: Payer: Self-pay | Admitting: Family Medicine

## 2015-09-19 NOTE — Telephone Encounter (Signed)
Pt needs a letter addressed [employer] stating that he has had chronic pain for a back injury.  That you had to increase his pain medication.  Also that you referred him to a pain clinic.  Patient did not ever go to the pain clinic due to cost.  Pt's call back is [phone]  Thanks [name]

## 2015-09-20 NOTE — Telephone Encounter (Signed)
Patient was notified.

## 2015-09-20 NOTE — Telephone Encounter (Signed)
Please advise 

## 2015-09-20 NOTE — Telephone Encounter (Signed)
We will call him when it is ready. Our policy is that letters will be done within a week.

## 2015-09-20 NOTE — Telephone Encounter (Signed)
Pt called back about the letter he requested. Pt asked if once the letter is ready if it could be faxed attention [facility] Fax# [phone]. Pt stated he would like it faxed by 09/24/15 because he has mediation on 09/25/15. Thanks TNP

## 2015-09-24 NOTE — Telephone Encounter (Signed)
Per Dr. Caryn Section letter will be ready by tomorrow.

## 2015-09-24 NOTE — Telephone Encounter (Signed)
Patient notified

## 2015-09-24 NOTE — Telephone Encounter (Signed)
Letter completed and patient notified by CMA

## 2015-10-07 ENCOUNTER — Other Ambulatory Visit: Payer: Self-pay | Admitting: Family Medicine

## 2015-10-07 DIAGNOSIS — M5489 Other dorsalgia: Secondary | ICD-10-CM

## 2015-10-07 MED ORDER — HYDROCODONE-ACETAMINOPHEN 10-325 MG PO TABS: 1.0000 | ORAL_TABLET | Freq: Four times a day (QID) | ORAL | Status: DC | PRN

## 2015-10-07 NOTE — Telephone Encounter (Signed)
Pt needs refill HYDROcodone-acetaminophen (NORCO) 10-325 MG tablet Taking 09/02/15 -- Birdie Sons, MD Take 1 tablet by mouth every 6 (six) hours as needed for moderate pain.   Thanks [name]

## 2015-10-14 ENCOUNTER — Telehealth: Payer: Self-pay | Admitting: Family Medicine

## 2015-10-14 DIAGNOSIS — G8929 Other chronic pain: Secondary | ICD-10-CM

## 2015-10-14 DIAGNOSIS — M549 Dorsalgia, unspecified: Principal | ICD-10-CM

## 2015-10-14 NOTE — Telephone Encounter (Signed)
Pt stated he was referred to pain clinic before but didn't have the money to go and wanted to see if he could be referred again. Pt stated he can not remember the name but it was in [place] and it was for his back pain. Please advise. Thanks TNP

## 2015-10-14 NOTE — Telephone Encounter (Signed)
Please advise 

## 2015-10-16 DIAGNOSIS — G8929 Other chronic pain: Secondary | ICD-10-CM

## 2015-10-16 HISTORY — DX: Other chronic pain: G89.29

## 2015-10-16 NOTE — Telephone Encounter (Signed)
Please refer pain clinic for chronic back pain.

## 2015-10-21 ENCOUNTER — Encounter: Payer: Self-pay | Admitting: Internal Medicine

## 2015-10-21 ENCOUNTER — Ambulatory Visit (INDEPENDENT_AMBULATORY_CARE_PROVIDER_SITE_OTHER): Payer: Self-pay | Admitting: Internal Medicine

## 2015-10-21 VITALS — BP 122/74 | HR 74 | Ht 70.0 in | Wt 203.8 lb

## 2015-10-21 DIAGNOSIS — I5042 Chronic combined systolic (congestive) and diastolic (congestive) heart failure: Secondary | ICD-10-CM

## 2015-10-21 DIAGNOSIS — Z95810 Presence of automatic (implantable) cardiac defibrillator: Secondary | ICD-10-CM

## 2015-10-21 DIAGNOSIS — I472 Ventricular tachycardia, unspecified: Secondary | ICD-10-CM

## 2015-10-21 DIAGNOSIS — I255 Ischemic cardiomyopathy: Secondary | ICD-10-CM

## 2015-10-21 DIAGNOSIS — R0683 Snoring: Secondary | ICD-10-CM

## 2015-10-21 LAB — CBC WITH DIFFERENTIAL/PLATELET
BASOS ABS: 0 10*3/uL (ref 0.0–0.1)
BASOS PCT: 0 % (ref 0–1)
EOS ABS: 0.3 10*3/uL (ref 0.0–0.7)
EOS PCT: 4 % (ref 0–5)
HCT: 44.6 % (ref 39.0–52.0)
Hemoglobin: 14.8 g/dL (ref 13.0–17.0)
LYMPHS ABS: 1.9 10*3/uL (ref 0.7–4.0)
LYMPHS PCT: 27 % (ref 12–46)
MCH: 31.3 pg (ref 26.0–34.0)
MCHC: 33.2 g/dL (ref 30.0–36.0)
MCV: 94.3 fL (ref 78.0–100.0)
MONOS PCT: 9 % (ref 3–12)
MPV: 12 fL (ref 8.6–12.4)
Monocytes Absolute: 0.6 10*3/uL (ref 0.1–1.0)
NEUTROS PCT: 60 % (ref 43–77)
Neutro Abs: 4.3 10*3/uL (ref 1.7–7.7)
PLATELETS: 241 10*3/uL (ref 150–400)
RBC: 4.73 MIL/uL (ref 4.22–5.81)
RDW: 14.2 % (ref 11.5–15.5)
WBC: 7.1 10*3/uL (ref 4.0–10.5)

## 2015-10-21 LAB — BASIC METABOLIC PANEL
BUN: 23 mg/dL (ref 7–25)
CHLORIDE: 98 mmol/L (ref 98–110)
CO2: 34 mmol/L — AB (ref 20–31)
CREATININE: 1.79 mg/dL — AB (ref 0.70–1.33)
Calcium: 9.8 mg/dL (ref 8.6–10.3)
Glucose, Bld: 82 mg/dL (ref 65–99)
POTASSIUM: 3.9 mmol/L (ref 3.5–5.3)
Sodium: 145 mmol/L (ref 135–146)

## 2015-10-21 LAB — TSH: TSH: 0.31 m[IU]/L — AB (ref 0.40–4.50)

## 2015-10-21 MED ORDER — ATORVASTATIN CALCIUM 20 MG PO TABS: 20.0000 mg | ORAL_TABLET | Freq: Every day | ORAL | Status: DC

## 2015-10-21 MED ORDER — ISOSORB DINITRATE-HYDRALAZINE 20-37.5 MG PO TABS: ORAL_TABLET | ORAL | Status: DC

## 2015-10-21 NOTE — Telephone Encounter (Signed)
Please add referral to EPIC °
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
## 2015-10-21 NOTE — Patient Instructions (Signed)
Medication Instructions: 1) Stop lisinopril/ hctz 2) Start Bi-dil 20/37.5 mg 1/2 tablet by mouth three times a day - a refill has also been sent in on your lipitor today.  Labwork: - Your physician recommends that you have lab work today: bmp/ cbc/ tsh  Procedures/Testing: - Your physician has requested that you have an echocardiogram. Echocardiography is a painless test that uses sound waves to create images of your heart. It provides your doctor with information about the size and shape of your heart and how well your heart's chambers and valves are working. This procedure takes approximately one hour. There are no restrictions for this procedure.  - Your physician has recommended that you have a sleep study. This test records several body functions during sleep, including: brain activity, eye movement, oxygen and carbon dioxide blood levels, heart rate and rhythm, breathing rate and rhythm, the flow of air through your mouth and nose, snoring, body muscle movements, and chest and belly movement.  Follow-Up: - Your physician recommends that you schedule a follow-up appointment in: 3 months with Dr. [name].  Any Additional Special Instructions Will Be Listed Below (If Applicable).     If you need a refill on your cardiac medications before your next appointment, please call your pharmacy.

## 2015-10-21 NOTE — Addendum Note (Signed)
Addended by: Julieta Bellini on: 10/21/2015 03:51 PM   Modules accepted: Orders

## 2015-10-21 NOTE — Telephone Encounter (Signed)
Referral entered in epic. 

## 2015-10-22 LAB — CUP PACEART INCLINIC DEVICE CHECK
Battery Remaining Longevity: 121 mo
Battery Voltage: 3.03 V
Brady Statistic RV Percent Paced: 0.04 %
HighPow Impedance: 83 Ohm
Implantable Lead Location: 753860
Implantable Lead Serial Number: 327195
Lead Channel Impedance Value: 608 Ohm
Lead Channel Impedance Value: 608 Ohm
Lead Channel Pacing Threshold Pulse Width: 0.4 ms
Lead Channel Setting Pacing Amplitude: 2 V
Lead Channel Setting Pacing Pulse Width: 0.4 ms
Lead Channel Setting Sensing Sensitivity: 0.3 mV
MDC IDC LEAD IMPLANT DT: 20150318
MDC IDC MSMT LEADCHNL RV PACING THRESHOLD AMPLITUDE: 0.5 V
MDC IDC MSMT LEADCHNL RV SENSING INTR AMPL: 10.875 mV
MDC IDC SESS DTM: 20170320181913

## 2015-10-24 ENCOUNTER — Telehealth: Payer: Self-pay | Admitting: Internal Medicine

## 2015-10-24 NOTE — Telephone Encounter (Signed)
New message    Pt c/o medication issue:  1. Name of Medication: hydralazine  20-37.5   2. How are you currently taking this medication (dosage and times per day)? Three time a day   3. Are you having a reaction (difficulty breathing--STAT)? Headache, throbbing in head.    4. What is your medication issue? Not feeling well , looking for alternative

## 2015-10-24 NOTE — Telephone Encounter (Signed)
Patient was seen 3/20 and started on bi-dil 1/2 tablet TID. He is reporting a headache with this. Recommendations?

## 2015-10-25 NOTE — Telephone Encounter (Signed)
Follow up     patient calling back to speak with the nurse.

## 2015-10-25 NOTE — Telephone Encounter (Signed)
Called patient and left message to call back.

## 2015-10-29 NOTE — Telephone Encounter (Signed)
Follow Up:   Returning call,he did not know he had been called back.

## 2015-10-29 NOTE — Telephone Encounter (Signed)
I spoke with the patient.  He states he took bi-dil for about 3 days and had a severe headache/ nausea. He couldn't work and had to leave if he went to work. He is feeling better off this medication. He states he never saw his BP go too low and his SBP is typically not above the 140 range. I advised that will forward to Dr. [name] to review. He is requesting a generic if his medication is changed.

## 2015-10-29 NOTE — Telephone Encounter (Signed)
If he can try this at low dose and hang in there with the HA for about 1 week it should go away

## 2015-10-29 NOTE — Telephone Encounter (Signed)
I left a message on the patient's identified voice mail that we were calling to inquire if he is still taking the Bi-Dil or had he stopped it due to a headache. I asked that he call back to let us know so we can review Dr. [name].

## 2015-11-01 ENCOUNTER — Other Ambulatory Visit: Payer: Self-pay | Admitting: *Deleted

## 2015-11-01 DIAGNOSIS — R7989 Other specified abnormal findings of blood chemistry: Secondary | ICD-10-CM

## 2015-11-01 NOTE — Telephone Encounter (Signed)
I left a message for the patient to call. 

## 2015-11-01 NOTE — Telephone Encounter (Signed)
I spoke with the patient.  He is aware of Dr. Olin Pia recommendations to try to stay on the bi-dil 1/2 tablet TID x 1 week to see if symptoms of HA/ nausea resolve. Discussed with Megan- pharm D as well.  She recommends using tylenol for HA and to take the doses with food. The patient is agreeable with retrying this. He will let us know next week if this is not working for him.

## 2015-11-06 ENCOUNTER — Other Ambulatory Visit (INDEPENDENT_AMBULATORY_CARE_PROVIDER_SITE_OTHER): Payer: Self-pay | Admitting: *Deleted

## 2015-11-06 ENCOUNTER — Other Ambulatory Visit: Payer: Self-pay

## 2015-11-06 ENCOUNTER — Ambulatory Visit (HOSPITAL_COMMUNITY): Payer: Self-pay | Attending: Cardiovascular Disease

## 2015-11-06 DIAGNOSIS — I472 Ventricular tachycardia, unspecified: Secondary | ICD-10-CM

## 2015-11-06 DIAGNOSIS — I255 Ischemic cardiomyopathy: Secondary | ICD-10-CM | POA: Insufficient documentation

## 2015-11-06 DIAGNOSIS — I2510 Atherosclerotic heart disease of native coronary artery without angina pectoris: Secondary | ICD-10-CM | POA: Insufficient documentation

## 2015-11-06 DIAGNOSIS — I129 Hypertensive chronic kidney disease with stage 1 through stage 4 chronic kidney disease, or unspecified chronic kidney disease: Secondary | ICD-10-CM | POA: Insufficient documentation

## 2015-11-06 DIAGNOSIS — I071 Rheumatic tricuspid insufficiency: Secondary | ICD-10-CM | POA: Insufficient documentation

## 2015-11-06 DIAGNOSIS — N189 Chronic kidney disease, unspecified: Secondary | ICD-10-CM | POA: Insufficient documentation

## 2015-11-06 DIAGNOSIS — R7989 Other specified abnormal findings of blood chemistry: Secondary | ICD-10-CM

## 2015-11-06 LAB — T3, FREE: T3 FREE: 3.9 pg/mL (ref 2.3–4.2)

## 2015-11-06 LAB — T4, FREE: FREE T4: 1.3 ng/dL (ref 0.8–1.8)

## 2015-11-07 ENCOUNTER — Other Ambulatory Visit: Payer: Self-pay

## 2015-11-08 ENCOUNTER — Telehealth: Payer: Self-pay | Admitting: *Deleted

## 2015-11-08 ENCOUNTER — Other Ambulatory Visit: Payer: Self-pay | Admitting: Nurse Practitioner

## 2015-11-08 NOTE — Telephone Encounter (Signed)
Left message for patient to call regarding sleep Study.     PSG has been ordered, but I need to know if patient has insurance that he wants to file.

## 2015-11-11 ENCOUNTER — Other Ambulatory Visit: Payer: Self-pay | Admitting: Family Medicine

## 2015-11-11 DIAGNOSIS — M5489 Other dorsalgia: Secondary | ICD-10-CM

## 2015-11-11 MED ORDER — HYDROCODONE-ACETAMINOPHEN 10-325 MG PO TABS: 1.0000 | ORAL_TABLET | Freq: Four times a day (QID) | ORAL | Status: DC | PRN

## 2015-11-11 NOTE — Telephone Encounter (Signed)
Pt contacted office for refill request on the following medications: HYDROcodone-acetaminophen (NORCO) 10-325 MG tablet. Last written on 10/07/15. Last OV:09/17/15. Please advise. Thanks TNP

## 2015-11-15 ENCOUNTER — Telehealth: Payer: Self-pay | Admitting: Internal Medicine

## 2015-11-15 ENCOUNTER — Encounter: Payer: Self-pay | Admitting: *Deleted

## 2015-11-15 NOTE — Telephone Encounter (Signed)
New message      Need note stating pt can return to normal work schedule.  Pt states he is feeling much better.  Please fax note to [phone] attn Donnie.

## 2015-11-15 NOTE — Telephone Encounter (Signed)
The patient is aware of his that he may work his normal schedule per Dr. [name]. I advised I will fax a letter for him this afternoon.  He also stated that he has been on the Bi-Dil ~ 1 month now. He is on 1/2 tablet BID and is still having problems with the HA (not relieved with Tylenol) and some mild nausea. He states he is dealing with this, but above symptoms are still present and he feels he cannot tolerate TID dosing. I advised I will forward to Dr. [name] for review and call him back next week. He is agreeable.

## 2015-11-18 NOTE — Telephone Encounter (Signed)
If he can tolerate the bidil, that  Would be good for him

## 2015-11-19 NOTE — Telephone Encounter (Signed)
I left a message on the patient's identified voice mail that his letter has been refaxed to [facility] at [phone].  Confirmation received both Friday and today when faxed. I also advised him that Dr. [name] would prefer he stay on the bi-dil if he can tolerate this. I asked that he call back with any further questions or concerns.

## 2015-11-19 NOTE — Telephone Encounter (Signed)
Follow up      Calling to have someone refax work note to return to normal schedule to [phone] attn Donnie.

## 2015-12-19 ENCOUNTER — Ambulatory Visit (HOSPITAL_BASED_OUTPATIENT_CLINIC_OR_DEPARTMENT_OTHER): Payer: Self-pay | Attending: Internal Medicine | Admitting: Cardiology

## 2015-12-19 VITALS — Ht 70.0 in | Wt 185.0 lb

## 2015-12-19 DIAGNOSIS — G4733 Obstructive sleep apnea (adult) (pediatric): Secondary | ICD-10-CM | POA: Insufficient documentation

## 2015-12-19 DIAGNOSIS — R0683 Snoring: Secondary | ICD-10-CM | POA: Insufficient documentation

## 2015-12-19 DIAGNOSIS — R0902 Hypoxemia: Secondary | ICD-10-CM | POA: Insufficient documentation

## 2015-12-19 DIAGNOSIS — G4734 Idiopathic sleep related nonobstructive alveolar hypoventilation: Secondary | ICD-10-CM

## 2015-12-19 HISTORY — PX: SPLIT NIGHT STUDY: SLE1000

## 2015-12-23 ENCOUNTER — Other Ambulatory Visit: Payer: Self-pay | Admitting: Family Medicine

## 2015-12-23 DIAGNOSIS — M5489 Other dorsalgia: Secondary | ICD-10-CM

## 2015-12-23 MED ORDER — HYDROCODONE-ACETAMINOPHEN 10-325 MG PO TABS: 1.0000 | ORAL_TABLET | Freq: Four times a day (QID) | ORAL | Status: DC | PRN

## 2015-12-23 NOTE — Telephone Encounter (Signed)
Pt needs refill HYDROcodone-acetaminophen (NORCO) 10-325 MG tablet ° ° °Thank sTeri °

## 2015-12-30 ENCOUNTER — Telehealth: Payer: Self-pay | Admitting: Cardiology

## 2015-12-30 ENCOUNTER — Encounter: Payer: Self-pay | Admitting: Family Medicine

## 2015-12-30 ENCOUNTER — Encounter (HOSPITAL_BASED_OUTPATIENT_CLINIC_OR_DEPARTMENT_OTHER): Payer: Self-pay | Admitting: Cardiology

## 2015-12-30 DIAGNOSIS — G4734 Idiopathic sleep related nonobstructive alveolar hypoventilation: Secondary | ICD-10-CM

## 2015-12-30 HISTORY — DX: Idiopathic sleep related nonobstructive alveolar hypoventilation: G47.34

## 2015-12-30 NOTE — Telephone Encounter (Signed)
This encounter was created in error - please disregard.

## 2015-12-30 NOTE — Telephone Encounter (Signed)
SEE NOTE SENT THAT I ROUTED YOU

## 2015-12-30 NOTE — Telephone Encounter (Signed)
Please call in clonazepam  

## 2015-12-30 NOTE — Telephone Encounter (Signed)
Please let patient know that he has moderate OSA only during his dream state of sleep but during that time is oxygen saturations drop very low.  Since his overall AHI is normal insurance will not pay for a CPAP unless we do a home CPAP titration first and prove improvement in snoring and hypoxemia.  I have sent an order to [facility].  Please set patient up to see me in 4 weeks after starting CPAP.

## 2015-12-30 NOTE — Procedures (Signed)
   Patient Name: Richard Cook, Richard Cook MRN: [id number] Study Date: 12/19/2015 Gender: Male D.O.B: [birth year] Age (years): 38 Referring Provider: Virl Axe Interpreting Physician: Fransico Him MD, ABSM RPSGT: Laren Everts  Weight (lbs): 185 BMI: 27 Neck Size: 16.00 Height (inches): 70 CLINICAL INFORMATION Sleep Study Type: NPSG Indication for sleep study: Fatigue, Snoring Epworth Sleepiness Score: 7  SLEEP STUDY TECHNIQUE As per the AASM Manual for the Scoring of Sleep and Associated Events v2.3 (April 2016) with a hypopnea requiring 4% desaturations. The channels recorded and monitored were frontal, central and occipital EEG, electrooculogram (EOG), submentalis EMG (chin), nasal and oral airflow, thoracic and abdominal wall motion, anterior tibialis EMG, snore microphone, electrocardiogram, and pulse oximetry.  MEDICATIONS Patient's medications include: Reviewed in the chart. Medications self-administered by patient during sleep study : No sleep medicine administered.  SLEEP ARCHITECTURE The study was initiated at 10:28:21 PM and ended at 4:39:29 AM. Sleep onset time was 56.9 minutes and the sleep efficiency was reduced at 78.0%. The total sleep time was 289.5 minutes. Stage REM latency was prolonged at 127.5 minutes. The patient spent 6.22% of the night in stage N1 sleep, 80.48% in stage N2 sleep, 0.00% in stage N3 and 13.30% in REM. Alpha intrusion was absent. Supine sleep was 76.68%.  RESPIRATORY PARAMETERS The overall apnea/hypopnea index (AHI) was 4.6 per hour. There were 0 total apneas, including 0 obstructive, 0 central and 0 mixed apneas. There were 22 hypopneas and 10 RERAs. The AHI during Stage REM sleep was 23.4 per hour. AHI while supine was 5.9 per hour. The mean oxygen saturation was 90.08%. The minimum SpO2 during sleep was 81.00%. Moderate snoring was noted during this study.  CARDIAC DATA The 2 lead EKG demonstrated sinus rhythm. The mean heart rate was  70.08 beats per minute. Other EKG findings include: PVCs.  LEG MOVEMENT DATA The total PLMS were 2 with a resulting PLMS index of 0.41. Associated arousal with leg movement index was 0.2 .  IMPRESSIONS - Overall no significant obstructive sleep apnea occurred during this study (AHI = 4.6/h) but during REM sleep there was moderate OSA (AHI = 23/hr) - No significant central sleep apnea occurred during this study (CAI = 0.0/h). - Moderate oxygen desaturation was noted during this study (Min O2 = 81.00%).  The oxygen saturation was < 88% for a total of 20 minutes.  - The patient snored with Moderate snoring volume. - EKG findings include PVCs. - Clinically significant periodic limb movements did not occur during sleep. No significant associated arousals.  DIAGNOSIS - Nocturnal Hypoxemia (327.26 [G47.36 ICD-10]) - OSA  RECOMMENDATIONS - Although the patient dose not meet criteria for OSA overall, he does have moderate OSA during REM sleep and nocturnal hypoxemia with oxygen saturations < 88% for 20 minutes.  Therefore, recommend a trial of auto CPAP for 2 weeks with overnight pulse oximetry on CPAP. - Avoid alcohol, sedatives and other CNS depressants that may worsen sleep apnea and disrupt normal sleep architecture. - Sleep hygiene should be reviewed to assess factors that may improve sleep quality. - Weight management and regular exercise should be initiated or continued if appropriate.  [place], American Board of Sleep Medicine  ELECTRONICALLY SIGNED ON:  12/30/2015, 1:40 PM [place] PH: [phone]   FX: [phone] [place]

## 2015-12-31 ENCOUNTER — Other Ambulatory Visit: Payer: Self-pay | Admitting: Family Medicine

## 2015-12-31 NOTE — Telephone Encounter (Signed)
Already called into pharmacy.

## 2015-12-31 NOTE — Telephone Encounter (Signed)
Rx called in to pharmacy. 

## 2015-12-31 NOTE — Telephone Encounter (Signed)
Pt needs new refill on his clonazePAM (KLONOPIN) 2 MG tablet   He uses [facility] on [address].  Thanks [name]

## 2016-01-02 NOTE — Telephone Encounter (Signed)
Patient stated that he is unsure that he wants to proceed with CPAP Treatment.   He wants to talk to Dr. [name] at his next office appointment and decide from there.   Routing this message to Dr. Olin Pia nurse so that they are aware.

## 2016-01-02 NOTE — Telephone Encounter (Signed)
Left message for patient to call for results. 

## 2016-01-02 NOTE — Telephone Encounter (Signed)
Noted. The patient has follow up scheduled with Dr. [name] on 01/21/16.

## 2016-01-13 ENCOUNTER — Telehealth: Payer: Self-pay | Admitting: Family Medicine

## 2016-01-13 ENCOUNTER — Encounter: Payer: Self-pay | Admitting: Pain Medicine

## 2016-01-13 ENCOUNTER — Ambulatory Visit: Payer: Self-pay | Attending: Pain Medicine | Admitting: Pain Medicine

## 2016-01-13 ENCOUNTER — Other Ambulatory Visit
Admission: RE | Admit: 2016-01-13 | Discharge: 2016-01-13 | Disposition: A | Discharge status: Home / Self Care | Payer: Self-pay | Source: Ambulatory Visit | Attending: Pain Medicine | Admitting: Pain Medicine

## 2016-01-13 VITALS — BP 99/71 | HR 64 | Temp 98.5°F | Resp 16 | Ht 70.0 in | Wt 182.0 lb

## 2016-01-13 DIAGNOSIS — Z8619 Personal history of other infectious and parasitic diseases: Secondary | ICD-10-CM | POA: Insufficient documentation

## 2016-01-13 DIAGNOSIS — I5042 Chronic combined systolic (congestive) and diastolic (congestive) heart failure: Secondary | ICD-10-CM | POA: Insufficient documentation

## 2016-01-13 DIAGNOSIS — M1030 Gout due to renal impairment, unspecified site: Secondary | ICD-10-CM

## 2016-01-13 DIAGNOSIS — A938 Other specified arthropod-borne viral fevers: Secondary | ICD-10-CM | POA: Insufficient documentation

## 2016-01-13 DIAGNOSIS — R001 Bradycardia, unspecified: Secondary | ICD-10-CM | POA: Insufficient documentation

## 2016-01-13 DIAGNOSIS — M109 Gout, unspecified: Secondary | ICD-10-CM | POA: Insufficient documentation

## 2016-01-13 DIAGNOSIS — G4730 Sleep apnea, unspecified: Secondary | ICD-10-CM | POA: Insufficient documentation

## 2016-01-13 DIAGNOSIS — I252 Old myocardial infarction: Secondary | ICD-10-CM | POA: Insufficient documentation

## 2016-01-13 DIAGNOSIS — S81809A Unspecified open wound, unspecified lower leg, initial encounter: Secondary | ICD-10-CM | POA: Insufficient documentation

## 2016-01-13 DIAGNOSIS — R21 Rash and other nonspecific skin eruption: Secondary | ICD-10-CM | POA: Insufficient documentation

## 2016-01-13 DIAGNOSIS — R1024 Suprapubic pain: Secondary | ICD-10-CM | POA: Insufficient documentation

## 2016-01-13 DIAGNOSIS — L03314 Cellulitis of groin: Secondary | ICD-10-CM | POA: Insufficient documentation

## 2016-01-13 DIAGNOSIS — Z7902 Long term (current) use of antithrombotics/antiplatelets: Secondary | ICD-10-CM | POA: Insufficient documentation

## 2016-01-13 DIAGNOSIS — A047 Enterocolitis due to Clostridium difficile: Secondary | ICD-10-CM | POA: Insufficient documentation

## 2016-01-13 DIAGNOSIS — I255 Ischemic cardiomyopathy: Secondary | ICD-10-CM | POA: Insufficient documentation

## 2016-01-13 DIAGNOSIS — R102 Pelvic and perineal pain: Secondary | ICD-10-CM | POA: Insufficient documentation

## 2016-01-13 DIAGNOSIS — I471 Supraventricular tachycardia: Secondary | ICD-10-CM | POA: Insufficient documentation

## 2016-01-13 DIAGNOSIS — M62830 Muscle spasm of back: Secondary | ICD-10-CM | POA: Insufficient documentation

## 2016-01-13 DIAGNOSIS — M1008 Idiopathic gout, vertebrae: Secondary | ICD-10-CM

## 2016-01-13 DIAGNOSIS — R110 Nausea: Secondary | ICD-10-CM | POA: Insufficient documentation

## 2016-01-13 DIAGNOSIS — M549 Dorsalgia, unspecified: Secondary | ICD-10-CM

## 2016-01-13 DIAGNOSIS — E782 Mixed hyperlipidemia: Secondary | ICD-10-CM | POA: Insufficient documentation

## 2016-01-13 DIAGNOSIS — N289 Disorder of kidney and ureter, unspecified: Secondary | ICD-10-CM | POA: Insufficient documentation

## 2016-01-13 DIAGNOSIS — M5127 Other intervertebral disc displacement, lumbosacral region: Secondary | ICD-10-CM | POA: Insufficient documentation

## 2016-01-13 DIAGNOSIS — J309 Allergic rhinitis, unspecified: Secondary | ICD-10-CM | POA: Insufficient documentation

## 2016-01-13 DIAGNOSIS — M79605 Pain in left leg: Secondary | ICD-10-CM | POA: Insufficient documentation

## 2016-01-13 DIAGNOSIS — M5126 Other intervertebral disc displacement, lumbar region: Secondary | ICD-10-CM | POA: Insufficient documentation

## 2016-01-13 DIAGNOSIS — M069 Rheumatoid arthritis, unspecified: Secondary | ICD-10-CM | POA: Insufficient documentation

## 2016-01-13 DIAGNOSIS — M545 Low back pain, unspecified: Secondary | ICD-10-CM | POA: Insufficient documentation

## 2016-01-13 DIAGNOSIS — L239 Allergic contact dermatitis, unspecified cause: Secondary | ICD-10-CM

## 2016-01-13 DIAGNOSIS — R5383 Other fatigue: Secondary | ICD-10-CM | POA: Insufficient documentation

## 2016-01-13 DIAGNOSIS — G4700 Insomnia, unspecified: Secondary | ICD-10-CM | POA: Insufficient documentation

## 2016-01-13 DIAGNOSIS — Z955 Presence of coronary angioplasty implant and graft: Secondary | ICD-10-CM | POA: Insufficient documentation

## 2016-01-13 DIAGNOSIS — M47815 Spondylosis without myelopathy or radiculopathy, thoracolumbar region: Secondary | ICD-10-CM | POA: Insufficient documentation

## 2016-01-13 DIAGNOSIS — I959 Hypotension, unspecified: Secondary | ICD-10-CM | POA: Insufficient documentation

## 2016-01-13 DIAGNOSIS — I472 Ventricular tachycardia: Secondary | ICD-10-CM | POA: Insufficient documentation

## 2016-01-13 DIAGNOSIS — Z950 Presence of cardiac pacemaker: Secondary | ICD-10-CM | POA: Insufficient documentation

## 2016-01-13 DIAGNOSIS — G8929 Other chronic pain: Secondary | ICD-10-CM | POA: Insufficient documentation

## 2016-01-13 DIAGNOSIS — E875 Hyperkalemia: Secondary | ICD-10-CM | POA: Insufficient documentation

## 2016-01-13 DIAGNOSIS — R0902 Hypoxemia: Secondary | ICD-10-CM | POA: Insufficient documentation

## 2016-01-13 DIAGNOSIS — N19 Unspecified kidney failure: Secondary | ICD-10-CM

## 2016-01-13 DIAGNOSIS — M79606 Pain in leg, unspecified: Secondary | ICD-10-CM

## 2016-01-13 DIAGNOSIS — Z951 Presence of aortocoronary bypass graft: Secondary | ICD-10-CM | POA: Insufficient documentation

## 2016-01-13 DIAGNOSIS — F411 Generalized anxiety disorder: Secondary | ICD-10-CM | POA: Insufficient documentation

## 2016-01-13 DIAGNOSIS — E7800 Pure hypercholesterolemia, unspecified: Secondary | ICD-10-CM | POA: Insufficient documentation

## 2016-01-13 DIAGNOSIS — K219 Gastro-esophageal reflux disease without esophagitis: Secondary | ICD-10-CM | POA: Insufficient documentation

## 2016-01-13 DIAGNOSIS — Z7982 Long term (current) use of aspirin: Secondary | ICD-10-CM | POA: Insufficient documentation

## 2016-01-13 DIAGNOSIS — I2510 Atherosclerotic heart disease of native coronary artery without angina pectoris: Secondary | ICD-10-CM | POA: Insufficient documentation

## 2016-01-13 DIAGNOSIS — M47816 Spondylosis without myelopathy or radiculopathy, lumbar region: Secondary | ICD-10-CM | POA: Insufficient documentation

## 2016-01-13 DIAGNOSIS — I130 Hypertensive heart and chronic kidney disease with heart failure and stage 1 through stage 4 chronic kidney disease, or unspecified chronic kidney disease: Secondary | ICD-10-CM | POA: Insufficient documentation

## 2016-01-13 DIAGNOSIS — K589 Irritable bowel syndrome without diarrhea: Secondary | ICD-10-CM | POA: Insufficient documentation

## 2016-01-13 HISTORY — DX: Pain in leg, unspecified: M79.606

## 2016-01-13 HISTORY — DX: Allergic contact dermatitis, unspecified cause: L23.9

## 2016-01-13 HISTORY — DX: Personal history of other infectious and parasitic diseases: Z86.19

## 2016-01-13 HISTORY — DX: Dorsalgia, unspecified: M54.9

## 2016-01-13 HISTORY — DX: Unspecified kidney failure: N19

## 2016-01-13 LAB — COMPREHENSIVE METABOLIC PANEL
ALK PHOS: 97 U/L (ref 38–126)
ALT: 14 U/L — ABNORMAL LOW (ref 17–63)
ANION GAP: 11 (ref 5–15)
AST: 16 U/L (ref 15–41)
Albumin: 4.3 g/dL (ref 3.5–5.0)
BILIRUBIN TOTAL: 1.1 mg/dL (ref 0.3–1.2)
BUN: 41 mg/dL — ABNORMAL HIGH (ref 6–20)
CALCIUM: 9.3 mg/dL (ref 8.9–10.3)
CO2: 26 mmol/L (ref 22–32)
Chloride: 104 mmol/L (ref 101–111)
Creatinine, Ser: 2.17 mg/dL — ABNORMAL HIGH (ref 0.61–1.24)
GFR calc non Af Amer: 32 mL/min — ABNORMAL LOW (ref >60)
GFR, EST AFRICAN AMERICAN: 37 mL/min — AB (ref >60)
GLUCOSE: 89 mg/dL (ref 65–99)
Potassium: 3.5 mmol/L (ref 3.5–5.1)
Sodium: 141 mmol/L (ref 135–145)
TOTAL PROTEIN: 7.3 g/dL (ref 6.5–8.1)

## 2016-01-13 LAB — C-REACTIVE PROTEIN: CRP: 1.9 mg/dL — ABNORMAL HIGH (ref <1.0)

## 2016-01-13 LAB — VITAMIN B12: Vitamin B-12: 331 pg/mL (ref 180–914)

## 2016-01-13 LAB — MAGNESIUM: Magnesium: 1.6 mg/dL — ABNORMAL LOW (ref 1.7–2.4)

## 2016-01-13 LAB — SEDIMENTATION RATE: SED RATE: 43 mm/h — AB (ref 0–20)

## 2016-01-13 NOTE — Progress Notes (Signed)
Safety precautions to be maintained throughout the outpatient stay will include: orient to surroundings, keep bed in low position, maintain call bell within reach at all times, provide assistance with transfer out of bed and ambulation.  

## 2016-01-13 NOTE — Patient Instructions (Addendum)
GENERAL RISKS AND COMPLICATIONS  What are the risk, side effects and possible complications? Generally speaking, most procedures are safe.  However, with any procedure there are risks, side effects, and the possibility of complications.  The risks and complications are dependent upon the sites that are lesioned, or the type of nerve block to be performed.  The closer the procedure is to the spine, the more serious the risks are.  Great care is taken when placing the radio frequency needles, block needles or lesioning probes, but sometimes complications can occur. 1. Infection: Any time there is an injection through the skin, there is a risk of infection.  This is why sterile conditions are used for these blocks.  There are four possible types of infection. 1. Localized skin infection. 2. Central Nervous System Infection-This can be in the form of Meningitis, which can be deadly. 3. Epidural Infections-This can be in the form of an epidural abscess, which can cause pressure inside of the spine, causing compression of the spinal cord with subsequent paralysis. This would require an emergency surgery to decompress, and there are no guarantees that the patient would recover from the paralysis. 4. Discitis-This is an infection of the intervertebral discs.  It occurs in about 1% of discography procedures.  It is difficult to treat and it may lead to surgery.        2. Pain: the needles have to go through skin and soft tissues, will cause soreness.       3. Damage to internal structures:  The nerves to be lesioned may be near blood vessels or    other nerves which can be potentially damaged.       4. Bleeding: Bleeding is more common if the patient is taking blood thinners such as  aspirin, Coumadin, Ticiid, Plavix, etc., or if he/she have some genetic predisposition  such as hemophilia. Bleeding into the spinal canal can cause compression of the spinal  cord with subsequent paralysis.  This would require an  emergency surgery to  decompress and there are no guarantees that the patient would recover from the  paralysis.       5. Pneumothorax:  Puncturing of a lung is a possibility, every time a needle is introduced in  the area of the chest or upper back.  Pneumothorax refers to free air around the  collapsed lung(s), inside of the thoracic cavity (chest cavity).  Another two possible  complications related to a similar event would include: Hemothorax and Chylothorax.   These are variations of the Pneumothorax, where instead of air around the collapsed  lung(s), you may have blood or chyle, respectively.       6. Spinal headaches: They may occur with any procedures in the area of the spine.       7. Persistent CSF (Cerebro-Spinal Fluid) leakage: This is a rare problem, but may occur  with prolonged intrathecal or epidural catheters either due to the formation of a fistulous  track or a dural tear.       8. Nerve damage: By working so close to the spinal cord, there is always a possibility of  nerve damage, which could be as serious as a permanent spinal cord injury with  paralysis.       9. Death:  Although rare, severe deadly allergic reactions known as "Anaphylactic  reaction" can occur to any of the medications used.      10. Worsening of the symptoms:  We can always make thing worse.    What are the chances of something like this happening? Chances of any of this occuring are extremely low.  By statistics, you have more of a chance of getting killed in a motor vehicle accident: while driving to the hospital than any of the above occurring .  Nevertheless, you should be aware that they are possibilities.  In general, it is similar to taking a shower.  Everybody knows that you can slip, hit your head and get killed.  Does that mean that you should not shower again?  Nevertheless always keep in mind that statistics do not mean anything if you happen to be on the wrong side of them.  Even if a procedure has a 1  (one) in a 1,000,000 (million) chance of going wrong, it you happen to be that one..Also, keep in mind that by statistics, you have more of a chance of having something go wrong when taking medications.  Who should not have this procedure? If you are on a blood thinning medication (e.g. Coumadin, Plavix, see list of "Blood Thinners"), or if you have an active infection going on, you should not have the procedure.  If you are taking any blood thinners, please inform your physician.  How should I prepare for this procedure?  Do not eat or drink anything at least six hours prior to the procedure.  Bring a driver with you .  It cannot be a taxi.  Come accompanied by an adult that can drive you back, and that is strong enough to help you if your legs get weak or numb from the local anesthetic.  Take all of your medicines the morning of the procedure with just enough water to swallow them.  If you have diabetes, make sure that you are scheduled to have your procedure done first thing in the morning, whenever possible.  If you have diabetes, take only half of your insulin dose and notify our nurse that you have done so as soon as you arrive at the clinic.  If you are diabetic, but only take blood sugar pills (oral hypoglycemic), then do not take them on the morning of your procedure.  You may take them after you have had the procedure.  Do not take aspirin or any aspirin-containing medications, at least eleven (11) days prior to the procedure.  They may prolong bleeding.  Wear loose fitting clothing that may be easy to take off and that you would not mind if it got stained with Betadine or blood.  Do not wear any jewelry or perfume  Remove any nail coloring.  It will interfere with some of our monitoring equipment.  NOTE: Remember that this is not meant to be interpreted as a complete list of all possible complications.  Unforeseen problems may occur.  BLOOD THINNERS The following drugs  contain aspirin or other products, which can cause increased bleeding during surgery and should not be taken for 2 weeks prior to and 1 week after surgery.  If you should need take something for relief of minor pain, you may take acetaminophen which is found in Tylenol,m Datril, Anacin-3 and Panadol. It is not blood thinner. The products listed below are.  Do not take any of the products listed below in addition to any listed on your instruction sheet.  A.P.C or A.P.C with Codeine Codeine Phosphate Capsules #3 Ibuprofen Ridaura  ABC compound Congesprin Imuran rimadil  Advil Cope Indocin Robaxisal  Alka-Seltzer Effervescent Pain Reliever and Antacid Coricidin or Coricidin-D  Indomethacin Rufen    Alka-Seltzer plus Cold Medicine Cosprin Ketoprofen S-A-C Tablets  Anacin Analgesic Tablets or Capsules Coumadin Korlgesic Salflex  Anacin Extra Strength Analgesic tablets or capsules CP-2 Tablets Lanoril Salicylate  Anaprox Cuprimine Capsules Levenox Salocol  Anexsia-D Dalteparin Magan Salsalate  Anodynos Darvon compound Magnesium Salicylate Sine-off  Ansaid Dasin Capsules Magsal Sodium Salicylate  Anturane Depen Capsules Marnal Soma  APF Arthritis pain formula Dewitt's Pills Measurin Stanback  Argesic Dia-Gesic Meclofenamic Sulfinpyrazone  Arthritis Bayer Timed Release Aspirin Diclofenac Meclomen Sulindac  Arthritis pain formula Anacin Dicumarol Medipren Supac  Analgesic (Safety coated) Arthralgen Diffunasal Mefanamic Suprofen  Arthritis Strength Bufferin Dihydrocodeine Mepro Compound Suprol  Arthropan liquid Dopirydamole Methcarbomol with Aspirin Synalgos  ASA tablets/Enseals Disalcid Micrainin Tagament  Ascriptin Doan's Midol Talwin  Ascriptin A/D Dolene Mobidin Tanderil  Ascriptin Extra Strength Dolobid Moblgesic Ticlid  Ascriptin with Codeine Doloprin or Doloprin with Codeine Momentum Tolectin  Asperbuf Duoprin Mono-gesic Trendar  Aspergum Duradyne Motrin or Motrin IB Triminicin  Aspirin  plain, buffered or enteric coated Durasal Myochrisine Trigesic  Aspirin Suppositories Easprin Nalfon Trillsate  Aspirin with Codeine Ecotrin Regular or Extra Strength Naprosyn Uracel  Atromid-S Efficin Naproxen Ursinus  Auranofin Capsules Elmiron Neocylate Vanquish  Axotal Emagrin Norgesic Verin  Azathioprine Empirin or Empirin with Codeine Normiflo Vitamin E  Azolid Emprazil Nuprin Voltaren  Bayer Aspirin plain, buffered or children's or timed BC Tablets or powders Encaprin Orgaran Warfarin Sodium  Buff-a-Comp Enoxaparin Orudis Zorpin  Buff-a-Comp with Codeine Equegesic Os-Cal-Gesic   Buffaprin Excedrin plain, buffered or Extra Strength Oxalid   Bufferin Arthritis Strength Feldene Oxphenbutazone   Bufferin plain or Extra Strength Feldene Capsules Oxycodone with Aspirin   Bufferin with Codeine Fenoprofen Fenoprofen Pabalate or Pabalate-SF   Buffets II Flogesic Panagesic   Buffinol plain or Extra Strength Florinal or Florinal with Codeine Panwarfarin   Buf-Tabs Flurbiprofen Penicillamine   Butalbital Compound Four-way cold tablets Penicillin   Butazolidin Fragmin Pepto-Bismol   Carbenicillin Geminisyn Percodan   Carna Arthritis Reliever Geopen Persantine   Carprofen Gold's salt Persistin   Chloramphenicol Goody's Phenylbutazone   Chloromycetin Haltrain Piroxlcam   Clmetidine heparin Plaquenil   Cllnoril Hyco-pap Ponstel   Clofibrate Hydroxy chloroquine Propoxyphen         Before stopping any of these medications, be sure to consult the physician who ordered them.  Some, such as Coumadin (Warfarin) are ordered to prevent or treat serious conditions such as "deep thrombosis", "pumonary embolisms", and other heart problems.  The amount of time that you may need off of the medication may also vary with the medication and the reason for which you were taking it.  If you are taking any of these medications, please make sure you notify your pain physician before you undergo any  procedures.         Epidural Steroid Injection Patient Information  Description: The epidural space surrounds the nerves as they exit the spinal cord.  In some patients, the nerves can be compressed and inflamed by a bulging disc or a tight spinal canal (spinal stenosis).  By injecting steroids into the epidural space, we can bring irritated nerves into direct contact with a potentially helpful medication.  These steroids act directly on the irritated nerves and can reduce swelling and inflammation which often leads to decreased pain.  Epidural steroids may be injected anywhere along the spine and from the neck to the low back depending upon the location of your pain.   After numbing the skin with local anesthetic (like Novocaine), a small needle is passed   into the epidural space slowly.  You may experience a sensation of pressure while this is being done.  The entire block usually last less than 10 minutes.  Conditions which may be treated by epidural steroids:   Low back and leg pain  Neck and arm pain  Spinal stenosis  Post-laminectomy syndrome  Herpes zoster (shingles) pain  Pain from compression fractures  Preparation for the injection:  1. Do not eat any solid food or dairy products within 8 hours of your appointment.  2. You may drink clear liquids up to 3 hours before appointment.  Clear liquids include water, black coffee, juice or soda.  No milk or cream please. 3. You may take your regular medication, including pain medications, with a sip of water before your appointment  Diabetics should hold regular insulin (if taken separately) and take 1/2 normal NPH dos the morning of the procedure.  Carry some sugar containing items with you to your appointment. 4. A driver must accompany you and be prepared to drive you home after your procedure.  5. Bring all your current medications with your. 6. An IV may be inserted and sedation may be given at the discretion of the  physician.   7. A blood pressure cuff, EKG and other monitors will often be applied during the procedure.  Some patients may need to have extra oxygen administered for a short period. 8. You will be asked to provide medical information, including your allergies, prior to the procedure.  We must know immediately if you are taking blood thinners (like Coumadin/Warfarin)  Or if you are allergic to IV iodine contrast (dye). We must know if you could possible be pregnant.  Possible side-effects:  Bleeding from needle site  Infection (rare, may require surgery)  Nerve injury (rare)  Numbness & tingling (temporary)  Difficulty urinating (rare, temporary)  Spinal headache ( a headache worse with upright posture)  Light -headedness (temporary)  Pain at injection site (several days)  Decreased blood pressure (temporary)  Weakness in arm/leg (temporary)  Pressure sensation in back/neck (temporary)  Call if you experience:  Fever/chills associated with headache or increased back/neck pain.  Headache worsened by an upright position.  New onset weakness or numbness of an extremity below the injection site  Hives or difficulty breathing (go to the emergency room)  Inflammation or drainage at the infection site  Severe back/neck pain  Any new symptoms which are concerning to you  Please note:  Although the local anesthetic injected can often make your back or neck feel good for several hours after the injection, the pain will likely return.  It takes 3-7 days for steroids to work in the epidural space.  You may not notice any pain relief for at least that one week.  If effective, we will often do a series of three injections spaced 3-6 weeks apart to maximally decrease your pain.  After the initial series, we generally will wait several months before considering a repeat injection of the same type.  If you have any questions, please call [phone] [facility]   INSTRUCTED TO GET [place]. FOLLOW THE PRE-EMPTIVE ANALGESIC PAPER THAT YOU WERE GIVEN.

## 2016-01-13 NOTE — Telephone Encounter (Signed)
Pt decided not to leave message

## 2016-01-13 NOTE — Progress Notes (Signed)
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 with any procedures in the area of the spine.       7. Persistent CSF (Cerebro-Spinal Fluid) leakage: This is a rare problem, but may occur  with prolonged intrathecal or epidural catheters either due to the formation of a fistulous  track or a dural tear.       8. Nerve damage: By working so close to the spinal cord, there is always a possibility of  nerve damage, which could be as serious as a permanent spinal cord injury with  paralysis.       9. Death:  Although rare, severe deadly allergic reactions known as "Anaphylactic  reaction" can occur to any of the medications used.      10. Worsening of the symptoms:  We can always make thing worse.  What are the chances of something like this happening? Chances of any of this occuring are extremely low.  By statistics, you have more of a chance of getting killed in a motor vehicle accident: while driving to the hospital than any of the above occurring  .  Nevertheless, you should be aware that they are possibilities.  In general, it is similar to taking a shower.  Everybody knows that you can slip, hit your head and get killed.  Does that mean that you should not shower again?  Nevertheless always keep in mind that statistics do not mean anything if you happen to be on the wrong side of them.  Even if a procedure has a 1 (one) in a 1,000,000 (million) chance of going wrong, it you happen to be that one..Also, keep in mind that by statistics, you have more of a chance of having something go wrong when taking medications.  Who should not have this procedure? If you are on a blood thinning medication (e.g. Coumadin, Plavix, see list of "Blood Thinners"), or if you have an active infection going on, you should not have the procedure.  If you are taking any blood thinners, please inform your physician.  How should I prepare for this procedure?  Do not eat or drink anything at least six hours prior to the procedure.  Bring a driver with you .  It cannot  be a taxi.  Come accompanied by an adult that can drive you back, and that is strong enough to help you if your legs get weak or numb from the local anesthetic.  Take all of your medicines the morning of the procedure with just enough water to swallow them.  If you have diabetes, make sure that you are scheduled to have your procedure done first thing in the morning, whenever possible.  If you have diabetes, take only half of your insulin dose and notify our nurse that you have done so as soon as you arrive at the clinic.  If you are diabetic, but only take blood sugar pills (oral hypoglycemic), then do not take them on the morning of your procedure.  You may take them after you have had the procedure.  Do not take aspirin or any aspirin-containing medications, at least eleven (11) days prior to the procedure.  They may prolong bleeding.  Wear loose fitting clothing that may be easy to take off and  that you would not mind if it got stained with Betadine or blood.  Do not wear any jewelry or perfume  Remove any nail coloring.  It will interfere with some of our monitoring equipment.  NOTE: Remember that this is not meant to be interpreted as a complete list of all possible complications.  Unforeseen problems may occur.  BLOOD THINNERS The following drugs contain aspirin or other products, which can cause increased bleeding during surgery and should not be taken for 2 weeks prior to and 1 week after surgery.  If you should need take something for relief of minor pain, you may take acetaminophen which is found in Tylenol,m Datril, Anacin-3 and Panadol. It is not blood thinner. The products listed below are.  Do not take any of the products listed below in addition to any listed on your instruction sheet.  A.P.C or A.P.C with Codeine Codeine Phosphate Capsules #3 Ibuprofen Ridaura  ABC compound Congesprin Imuran rimadil  Advil Cope Indocin Robaxisal  Alka-Seltzer Effervescent Pain Reliever and Antacid Coricidin or Coricidin-D  Indomethacin Rufen  Alka-Seltzer plus Cold Medicine Cosprin Ketoprofen S-A-C Tablets  Anacin Analgesic Tablets or Capsules Coumadin Korlgesic Salflex  Anacin Extra Strength Analgesic tablets or capsules CP-2 Tablets Lanoril Salicylate  Anaprox Cuprimine Capsules Levenox Salocol  Anexsia-D Dalteparin Magan Salsalate  Anodynos Darvon compound Magnesium Salicylate Sine-off  Ansaid Dasin Capsules Magsal Sodium Salicylate  Anturane Depen Capsules Marnal Soma  APF Arthritis pain formula Dewitt's Pills Measurin Stanback  Argesic Dia-Gesic Meclofenamic Sulfinpyrazone  Arthritis Bayer Timed Release Aspirin Diclofenac Meclomen Sulindac  Arthritis pain formula Anacin Dicumarol Medipren Supac  Analgesic (Safety coated) Arthralgen Diffunasal Mefanamic Suprofen  Arthritis Strength Bufferin Dihydrocodeine Mepro Compound Suprol  Arthropan liquid Dopirydamole Methcarbomol with  Aspirin Synalgos  ASA tablets/Enseals Disalcid Micrainin Tagament  Ascriptin Doan's Midol Talwin  Ascriptin A/D Dolene Mobidin Tanderil  Ascriptin Extra Strength Dolobid Moblgesic Ticlid  Ascriptin with Codeine Doloprin or Doloprin with Codeine Momentum Tolectin  Asperbuf Duoprin Mono-gesic Trendar  Aspergum Duradyne Motrin or Motrin IB Triminicin  Aspirin plain, buffered or enteric coated Durasal Myochrisine Trigesic  Aspirin Suppositories Easprin Nalfon Trillsate  Aspirin with Codeine Ecotrin Regular or Extra Strength Naprosyn Uracel  Atromid-S Efficin Naproxen Ursinus  Auranofin Capsules Elmiron Neocylate Vanquish  Axotal Emagrin Norgesic Verin  Azathioprine Empirin or Empirin with Codeine Normiflo Vitamin E  Azolid Emprazil Nuprin Voltaren  Bayer Aspirin plain, buffered or children's or timed BC Tablets or powders Encaprin Orgaran Warfarin Sodium  Buff-a-Comp  Enoxaparin Orudis Zorpin  Buff-a-Comp with Codeine Equegesic Os-Cal-Gesic   Buffaprin Excedrin plain, buffered or Extra Strength Oxalid   Bufferin Arthritis Strength Feldene Oxphenbutazone   Bufferin plain or Extra Strength Feldene Capsules Oxycodone with Aspirin   Bufferin with Codeine Fenoprofen Fenoprofen Pabalate or Pabalate-SF   Buffets II Flogesic Panagesic   Buffinol plain or Extra Strength Florinal or Florinal with Codeine Panwarfarin   Buf-Tabs Flurbiprofen Penicillamine   Butalbital Compound Four-way cold tablets Penicillin   Butazolidin Fragmin Pepto-Bismol   Carbenicillin Geminisyn Percodan   Carna Arthritis Reliever Geopen Persantine   Carprofen Gold's salt Persistin   Chloramphenicol Goody's Phenylbutazone   Chloromycetin Haltrain Piroxlcam   Clmetidine heparin Plaquenil   Cllnoril Hyco-pap Ponstel   Clofibrate Hydroxy chloroquine Propoxyphen         Before stopping any of these medications, be sure to consult the physician who ordered them.  Some, such as Coumadin (Warfarin) are ordered to prevent or  treat serious conditions such as "deep thrombosis", "pumonary embolisms", and other heart problems.  The amount of time that you may need off of the medication may also vary with the medication and the reason for which you were taking it.  If you are taking any of these medications, please make sure you notify your pain physician before you undergo any procedures.         Epidural Steroid Injection Patient Information  Description: The epidural space surrounds the nerves as they exit the spinal cord.  In some patients, the nerves can be compressed and inflamed by a bulging disc or a tight spinal canal (spinal stenosis).  By injecting steroids into the epidural space, we can bring irritated nerves into direct contact with a potentially helpful medication.  These steroids act directly on the irritated nerves and can reduce swelling and inflammation which often leads to decreased pain.  Epidural steroids may be injected anywhere along the spine and from the neck to the low back depending upon the location of your pain.   After numbing the skin with local anesthetic (like Novocaine), a small needle is passed into the epidural space slowly.  You may experience a sensation of pressure while this is being done.  The entire block usually last less than 10 minutes.  Conditions which may be treated by epidural steroids:   Low back and leg pain  Neck and arm pain  Spinal stenosis  Post-laminectomy syndrome  Herpes zoster (shingles) pain  Pain from compression fractures  Preparation for the injection:  1. Do not eat any solid food or dairy products within 8 hours of your appointment.  2. You may drink clear liquids up to 3 hours before appointment.  Clear liquids include water, black coffee, juice or soda.  No milk or cream please. 3. You may take your regular medication, including pain medications, with a sip of water before your appointment  Diabetics should hold regular insulin (if taken  separately) and take 1/2 normal NPH dos the morning of the procedure.  Carry some sugar containing items with you to your appointment. 4. A driver must accompany you and be prepared to drive you home after your procedure.  5. Bring all your current medications with your. 6. An IV may be inserted and sedation may be given at the discretion of the physician.   7. A blood pressure cuff, EKG and other monitors will often be applied during the procedure.  Some patients may need to have extra oxygen administered for a short period. 8.  You will be asked to provide medical information, including your allergies, prior to the procedure.  We must know immediately if you are taking blood thinners (like Coumadin/Warfarin)  Or if you are allergic to IV iodine contrast (dye). We must know if you could possible be pregnant.  Possible side-effects:  Bleeding from needle site  Infection (rare, may require surgery)  Nerve injury (rare)  Numbness & tingling (temporary)  Difficulty urinating (rare, temporary)  Spinal headache ( a headache worse with upright posture)  Light -headedness (temporary)  Pain at injection site (several days)  Decreased blood pressure (temporary)  Weakness in arm/leg (temporary)  Pressure sensation in back/neck (temporary)  Call if you experience:  Fever/chills associated with headache or increased back/neck pain.  Headache worsened by an upright position.  New onset weakness or numbness of an extremity below the injection site  Hives or difficulty breathing (go to the emergency room)  Inflammation or drainage at the infection site  Severe back/neck pain  Any new symptoms which are concerning to you  Please note:  Although the local anesthetic injected can often make your back or neck feel good for several hours after the injection, the pain will likely return.  It takes 3-7 days for steroids to work in the epidural space.  You may not notice any pain relief for  at least that one week.  If effective, we will often do a series of three injections spaced 3-6 weeks apart to maximally decrease your pain.  After the initial series, we generally will wait several months before considering a repeat injection of the same type.  If you have any questions, please call [phone] [facility]   INSTRUCTED TO GET [facility]. FOLLOW THE PRE-EMPTIVE ANALGESIC PAPER THAT YOU WERE GIVEN.

## 2016-01-16 ENCOUNTER — Ambulatory Visit: Payer: Self-pay | Admitting: Pain Medicine

## 2016-01-16 LAB — 25-HYDROXY VITAMIN D LCMS D2+D3
25-Hydroxy, Vitamin D-2: <1 ng/mL
25-Hydroxy, Vitamin D-3: 33 ng/mL
25-Hydroxy, Vitamin D: 33 ng/mL

## 2016-01-20 ENCOUNTER — Encounter: Payer: Self-pay | Admitting: Pain Medicine

## 2016-01-20 DIAGNOSIS — N185 Chronic kidney disease, stage 5: Secondary | ICD-10-CM | POA: Insufficient documentation

## 2016-01-20 DIAGNOSIS — N184 Chronic kidney disease, stage 4 (severe): Secondary | ICD-10-CM | POA: Insufficient documentation

## 2016-01-20 DIAGNOSIS — R700 Elevated erythrocyte sedimentation rate: Secondary | ICD-10-CM | POA: Insufficient documentation

## 2016-01-20 DIAGNOSIS — R7982 Elevated C-reactive protein (CRP): Secondary | ICD-10-CM | POA: Insufficient documentation

## 2016-01-20 NOTE — Progress Notes (Signed)

## 2016-01-20 NOTE — Progress Notes (Signed)
Quick Note:   A normal sedimentation rate should be below 30 mm/hr. The sed rate is an acute phase reactant that indirectly measures the degree of inflammation present in the body. It can be acute, developing rapidly after trauma, injury or infection, for example, or can occur over an extended time (chronic) with conditions such as autoimmune diseases or cancer. The ESR is not diagnostic; it is a non-specific, screening test that may be elevated in a number of these different conditions. It provides general information about the presence or absence of an inflammatory condition.  The combined elevation of the ESR & CRP, may be suggestive of an autoimmune disease. Should this be the case, we will inquire if the patient has had a rheumatologic evaluation looking at the RF levels, ANA levels, and CBC.  ______ 

## 2016-01-20 NOTE — Progress Notes (Signed)
Quick Note:  BUN levels between 7 to 20 mg/dL (2.5 to 7.1 mmol/L) are considered normal. Elevated blood urea nitrogen can also be due to: urinary tract obstruction; congestive heart failure or recent heart attack; gastrointestinal bleeding; dehydration; shock; severe burns; certain medications, such as corticosteroids and some antibiotics; and/or a high protein diet.  Normal Creatinine levels are between 0.5 and 0.9 mg/dl for our lab. Any condition that impairs the function of the kidneys is likely to raise the creatinine level in the blood. The most common causes of longstanding (chronic) kidney disease in adults are high blood pressure and diabetes. Other causes of elevated blood creatinine levels include drugs, ingestion of a large amount of dietary meat, kidney infections, rhabdomyolysis (abnormal muscle breakdown), and urinary tract obstruction.  BUN-to-creatinine ratio >20:1 (BUN dispropertionally higher than the creatinine levels) suggests prerenal azotemia (dehydration or renal hypoperfusion), while <10:1 levels suggest renal damage.  While most low ALT level results indicate a normal healthy liver, that may not always be the case. A low-functioning or non-functioning liver, lacking normal levels of ALT activity to begin with, would not release a lot of ALT into the blood when damaged. People infected with the hepatitis C virus initially show high ALT levels in their blood, but these levels fall over time. Because the ALT test measures ALT levels at only one point in time, people with chronic hepatitis C infection may already have experienced the ALT peak well before blood was drawn for the ALT test. Urinary tract infections or malnutrition may also cause low blood ALT levels.  eGFR (Estimated Glomerular Filtration Rate) results are reported as milliliters/minute/1.73m2 (mL/min/1.73m2). Because some laboratories do not collect information on a patient's race when the sample is collected for  testing, they may report calculated results for both African Americans and non-African Americans.  The National Kidney Foundation (NKF) suggests only reporting actual results once values are < 60 mL/min. 1. Normal values: 90-120 mL/min 2. Below 60 mL/min suggests that some kidney damage has occurred. 3. Between 59 and 30 indicate (Moderate) Stage 3 kidney disease. 4. Between 29 and 15 represent (Severe) Stage 4 kidney disease. 5. Less than 15 is considered (Kidney Failure) Stage 5. ______ 

## 2016-01-20 NOTE — Progress Notes (Signed)
Quick Note:  Normal Magnesium levels are between 1.6 and 2.3 mEq/L. Low magnesium blood level can lead to low calcium and potassium levels. Low levels may indicate inadequate dietary consuming, poor absorbtion, or excessive excretion. Signs and symptoms may include: leg cramps, foot pain, muscle twitches, loss of appetite, nausea, vomiting, fatigue, weakness, numbness, tingling, seizures, personality changes, abnormal heart rhythms, and/or coronary artery spasms.  ______ 

## 2016-01-21 ENCOUNTER — Encounter: Payer: Self-pay | Admitting: Internal Medicine

## 2016-01-21 ENCOUNTER — Ambulatory Visit (INDEPENDENT_AMBULATORY_CARE_PROVIDER_SITE_OTHER): Payer: Self-pay | Admitting: Internal Medicine

## 2016-01-21 VITALS — BP 110/62 | HR 72 | Ht 70.0 in | Wt 189.8 lb

## 2016-01-21 DIAGNOSIS — I255 Ischemic cardiomyopathy: Secondary | ICD-10-CM

## 2016-01-21 DIAGNOSIS — I472 Ventricular tachycardia, unspecified: Secondary | ICD-10-CM

## 2016-01-21 DIAGNOSIS — Z95810 Presence of automatic (implantable) cardiac defibrillator: Secondary | ICD-10-CM

## 2016-01-21 LAB — CUP PACEART INCLINIC DEVICE CHECK
Battery Remaining Longevity: 119 mo
Brady Statistic RV Percent Paced: 0.01 %
HIGH POWER IMPEDANCE MEASURED VALUE: 77 Ohm
Implantable Lead Implant Date: 20150318
Implantable Lead Serial Number: 327195
Lead Channel Impedance Value: 608 Ohm
Lead Channel Pacing Threshold Amplitude: 0.75 V
Lead Channel Sensing Intrinsic Amplitude: 10.4 mV
Lead Channel Setting Pacing Amplitude: 2 V
MDC IDC LEAD LOCATION: 753860
MDC IDC MSMT BATTERY VOLTAGE: 3.03 V
MDC IDC MSMT LEADCHNL RV IMPEDANCE VALUE: 608 Ohm
MDC IDC MSMT LEADCHNL RV PACING THRESHOLD PULSEWIDTH: 0.4 ms
MDC IDC SESS DTM: 20170620091426
MDC IDC SET LEADCHNL RV PACING PULSEWIDTH: 0.4 ms
MDC IDC SET LEADCHNL RV SENSING SENSITIVITY: 0.3 mV

## 2016-01-21 MED ORDER — HYDRALAZINE HCL 25 MG PO TABS: ORAL_TABLET | ORAL | Status: DC

## 2016-01-21 MED ORDER — ATORVASTATIN CALCIUM 20 MG PO TABS: 20.0000 mg | ORAL_TABLET | Freq: Every day | ORAL | Status: DC

## 2016-01-21 MED ORDER — FUROSEMIDE 40 MG PO TABS: ORAL_TABLET | ORAL | Status: DC

## 2016-01-21 MED ORDER — ISOSORBIDE MONONITRATE ER 30 MG PO TB24: 30.0000 mg | ORAL_TABLET | Freq: Every day | ORAL | Status: DC
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
## 2016-01-21 NOTE — Patient Instructions (Addendum)
Medication Instructions: - Your physician has recommended you make the following change in your medication:  1) Stop bi-dil 2) Start hydralazine 25 mg one tablet by mouth twice daily 3) Start isosorbide MN 30 mg once daily at bedtime - Take the printed discount drug card with you to the pharmacy when you get your atorvastatin refilled  Labwork: - none  Procedures/Testing: - none  Follow-Up: - Remote monitoring is used to monitor your Pacemaker of ICD from home. This monitoring reduces the number of office visits required to check your device to one time per year. It allows [place] to keep an eye on the functioning of your device to ensure it is working properly. You are scheduled for a device check from home on 04/21/16. You may send your transmission at any time that day. If you have a wireless device, the transmission will be sent automatically. After your physician reviews your transmission, you will receive a postcard with your next transmission date.  - Your physician wants you to follow-up in: 4 months with Dr. [name]. You will receive a reminder letter in the mail two months in advance. If you don't receive a letter, please call our office to schedule the follow-up appointment.  Any Additional Special Instructions Will Be Listed Below (If Applicable).     If you need a refill on your cardiac medications before your next appointment, please call your pharmacy.

## 2016-01-22 ENCOUNTER — Telehealth: Payer: Self-pay | Admitting: Internal Medicine

## 2016-01-22 NOTE — Telephone Encounter (Signed)
New message      Pt c/o medication issue:  1. Name of Medication: hydralazine  2. How are you currently taking this medication (dosage and times per day)? 25mg  3. Are you having a reaction (difficulty breathing--STAT)? no 4. What is your medication issue? Pt want to know why is he on this bp medication?  He states he is already on a bp medication and this will make two bp meds.

## 2016-01-22 NOTE — Telephone Encounter (Signed)
I called and spoke with the patient.  We discussed the reasoning for him taking the hydralazine. He voices understanding.  He had also called back yesterday with the cost of Ranexa and Mexilitene. Ranexa was over $200/ month & mexilitene was $150/ month. Dr. [name] was made aware and wanted to see if the patient felt like he could afford the mexilitene. I reviewed this with the patient. 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  He does not think this is possible right now. I advised the patient I will make Dr. [name] aware.  He is agreeable.

## 2016-01-28 ENCOUNTER — Other Ambulatory Visit: Payer: Self-pay

## 2016-01-28 DIAGNOSIS — M5489 Other dorsalgia: Secondary | ICD-10-CM

## 2016-01-28 MED ORDER — HYDROCODONE-ACETAMINOPHEN 10-325 MG PO TABS: 1.0000 | ORAL_TABLET | Freq: Four times a day (QID) | ORAL | Status: DC | PRN

## 2016-01-28 NOTE — Telephone Encounter (Signed)
Pt requesting refill for Norco 10-325. Dr. Maralyn Sago pt. Last refill was 12/23/2015. CB# [phone]. Renaldo Fiddler, CMA

## 2016-01-30 ENCOUNTER — Ambulatory Visit: Payer: Self-pay | Attending: Pain Medicine | Admitting: Pain Medicine

## 2016-01-30 ENCOUNTER — Encounter: Payer: Self-pay | Admitting: Pain Medicine

## 2016-01-30 VITALS — BP 117/78 | HR 72 | Temp 98.3°F | Resp 13 | Ht 70.0 in | Wt 180.0 lb

## 2016-01-30 DIAGNOSIS — I252 Old myocardial infarction: Secondary | ICD-10-CM | POA: Insufficient documentation

## 2016-01-30 DIAGNOSIS — E875 Hyperkalemia: Secondary | ICD-10-CM | POA: Insufficient documentation

## 2016-01-30 DIAGNOSIS — I2510 Atherosclerotic heart disease of native coronary artery without angina pectoris: Secondary | ICD-10-CM | POA: Insufficient documentation

## 2016-01-30 DIAGNOSIS — G8929 Other chronic pain: Secondary | ICD-10-CM | POA: Insufficient documentation

## 2016-01-30 DIAGNOSIS — J309 Allergic rhinitis, unspecified: Secondary | ICD-10-CM | POA: Insufficient documentation

## 2016-01-30 DIAGNOSIS — Z951 Presence of aortocoronary bypass graft: Secondary | ICD-10-CM | POA: Insufficient documentation

## 2016-01-30 DIAGNOSIS — A938 Other specified arthropod-borne viral fevers: Secondary | ICD-10-CM | POA: Insufficient documentation

## 2016-01-30 DIAGNOSIS — E7800 Pure hypercholesterolemia, unspecified: Secondary | ICD-10-CM | POA: Insufficient documentation

## 2016-01-30 DIAGNOSIS — I959 Hypotension, unspecified: Secondary | ICD-10-CM | POA: Insufficient documentation

## 2016-01-30 DIAGNOSIS — M79605 Pain in left leg: Secondary | ICD-10-CM | POA: Insufficient documentation

## 2016-01-30 DIAGNOSIS — K219 Gastro-esophageal reflux disease without esophagitis: Secondary | ICD-10-CM | POA: Insufficient documentation

## 2016-01-30 DIAGNOSIS — I255 Ischemic cardiomyopathy: Secondary | ICD-10-CM | POA: Insufficient documentation

## 2016-01-30 DIAGNOSIS — A047 Enterocolitis due to Clostridium difficile: Secondary | ICD-10-CM | POA: Insufficient documentation

## 2016-01-30 DIAGNOSIS — M4726 Other spondylosis with radiculopathy, lumbar region: Secondary | ICD-10-CM

## 2016-01-30 DIAGNOSIS — R001 Bradycardia, unspecified: Secondary | ICD-10-CM | POA: Insufficient documentation

## 2016-01-30 DIAGNOSIS — I471 Supraventricular tachycardia: Secondary | ICD-10-CM | POA: Insufficient documentation

## 2016-01-30 DIAGNOSIS — F411 Generalized anxiety disorder: Secondary | ICD-10-CM | POA: Insufficient documentation

## 2016-01-30 DIAGNOSIS — M109 Gout, unspecified: Secondary | ICD-10-CM | POA: Insufficient documentation

## 2016-01-30 DIAGNOSIS — E7801 Familial hypercholesterolemia: Secondary | ICD-10-CM | POA: Insufficient documentation

## 2016-01-30 DIAGNOSIS — I5042 Chronic combined systolic (congestive) and diastolic (congestive) heart failure: Secondary | ICD-10-CM | POA: Insufficient documentation

## 2016-01-30 DIAGNOSIS — I130 Hypertensive heart and chronic kidney disease with heart failure and stage 1 through stage 4 chronic kidney disease, or unspecified chronic kidney disease: Secondary | ICD-10-CM | POA: Insufficient documentation

## 2016-01-30 DIAGNOSIS — M47896 Other spondylosis, lumbar region: Secondary | ICD-10-CM | POA: Insufficient documentation

## 2016-01-30 DIAGNOSIS — L03314 Cellulitis of groin: Secondary | ICD-10-CM | POA: Insufficient documentation

## 2016-01-30 DIAGNOSIS — I472 Ventricular tachycardia: Secondary | ICD-10-CM | POA: Insufficient documentation

## 2016-01-30 DIAGNOSIS — R700 Elevated erythrocyte sedimentation rate: Secondary | ICD-10-CM | POA: Insufficient documentation

## 2016-01-30 MED ORDER — LIDOCAINE HCL (PF) 1 % IJ SOLN
10.0000 mL | Freq: Once | INTRAMUSCULAR | Status: AC
  Administered 2016-01-30: 10 mL
  Filled 2016-01-30: qty 10

## 2016-01-30 MED ORDER — SODIUM CHLORIDE 0.9% FLUSH
2.0000 mL | Freq: Once | INTRAVENOUS | Status: AC
  Administered 2016-01-30: 2 mL

## 2016-01-30 MED ORDER — TRIAMCINOLONE ACETONIDE 40 MG/ML IJ SUSP
40.0000 mg | Freq: Once | INTRAMUSCULAR | Status: AC
  Administered 2016-01-30: 40 mg
  Filled 2016-01-30: qty 1

## 2016-01-30 MED ORDER — ROPIVACAINE HCL 2 MG/ML IJ SOLN
2.0000 mL | Freq: Once | INTRAMUSCULAR | Status: AC
  Administered 2016-01-30: 2 mL via EPIDURAL
  Filled 2016-01-30: qty 10

## 2016-01-30 MED ORDER — SODIUM CHLORIDE 0.9 % IJ SOLN
INTRAMUSCULAR | Status: AC
  Filled 2016-01-30: qty 10

## 2016-01-30 MED ORDER — IOPAMIDOL (ISOVUE-M 200) INJECTION 41%
10.0000 mL | Freq: Once | INTRAMUSCULAR | Status: AC
  Administered 2016-01-30: 10 mL via EPIDURAL
  Filled 2016-01-30: qty 10

## 2016-01-30 NOTE — Patient Instructions (Signed)
Epidural Steroid Injection Patient Information  Description: The epidural space surrounds the nerves as they exit the spinal cord.  In some patients, the nerves can be compressed and inflamed by a bulging disc or a tight spinal canal (spinal stenosis).  By injecting steroids into the epidural space, we can bring irritated nerves into direct contact with a potentially helpful medication.  These steroids act directly on the irritated nerves and can reduce swelling and inflammation which often leads to decreased pain.  Epidural steroids may be injected anywhere along the spine and from the neck to the low back depending upon the location of your pain.   After numbing the skin with local anesthetic (like Novocaine), a small needle is passed into the epidural space slowly.  You may experience a sensation of pressure while this is being done.  The entire block usually last less than 10 minutes.  Conditions which may be treated by epidural steroids:   Low back and leg pain  Neck and arm pain  Spinal stenosis  Post-laminectomy syndrome  Herpes zoster (shingles) pain  Pain from compression fractures  Preparation for the injection:  1. Do not eat any solid food or dairy products within 8 hours of your appointment.  2. You may drink clear liquids up to 3 hours before appointment.  Clear liquids include water, black coffee, juice or soda.  No milk or cream please. 3. You may take your regular medication, including pain medications, with a sip of water before your appointment  Diabetics should hold regular insulin (if taken separately) and take 1/2 normal NPH dos the morning of the procedure.  Carry some sugar containing items with you to your appointment. 4. A driver must accompany you and be prepared to drive you home after your procedure.  5. Bring all your current medications with your. 6. An IV may be inserted and sedation may be given at the discretion of the physician.   7. A blood pressure  cuff, EKG and other monitors will often be applied during the procedure.  Some patients may need to have extra oxygen administered for a short period. 8. You will be asked to provide medical information, including your allergies, prior to the procedure.  We must know immediately if you are taking blood thinners (like Coumadin/Warfarin)  Or if you are allergic to IV iodine contrast (dye). We must know if you could possible be pregnant.  Possible side-effects:  Bleeding from needle site  Infection (rare, may require surgery)  Nerve injury (rare)  Numbness & tingling (temporary)  Difficulty urinating (rare, temporary)  Spinal headache ( a headache worse with upright posture)  Light -headedness (temporary)  Pain at injection site (several days)  Decreased blood pressure (temporary)  Weakness in arm/leg (temporary)  Pressure sensation in back/neck (temporary)  Call if you experience:  Fever/chills associated with headache or increased back/neck pain.  Headache worsened by an upright position.  New onset weakness or numbness of an extremity below the injection site  Hives or difficulty breathing (go to the emergency room)  Inflammation or drainage at the infection site  Severe back/neck pain  Any new symptoms which are concerning to you  Please note:  Although the local anesthetic injected can often make your back or neck feel good for several hours after the injection, the pain will likely return.  It takes 3-7 days for steroids to work in the epidural space.  You may not notice any pain relief for at least that one week.    If effective, we will often do a series of three injections spaced 3-6 weeks apart to maximally decrease your pain.  After the initial series, we generally will wait several months before considering a repeat injection of the same type.  If you have any questions, please call [phone] [facility]  Discharge Instructions  General Discharge Instructions :  If you need to reach your doctor call: Monday-Friday 8:00 am - 4:00 pm at [phone] or toll free [phone].  After clinic hours [phone] to have operator reach doctor.  Bring all of your medication bottles to all your appointments in the pain clinic.  To cancel or reschedule your appointment with Pain Management please remember to call 24 hours in advance to avoid a fee.  Refer to the educational materials which you have been given on: General Risks, I had my Procedure. Discharge Instructions, Post Sedation.  Post Procedure Instructions:  The drugs you were given will stay in your system until tomorrow, so for the next 24 hours you should not drive, make any legal decisions or drink any alcoholic beverages.  You may eat anything you prefer, but it is better to start with liquids then soups and crackers, and gradually work up to solid foods.  Please notify your doctor immediately if you have any unusual bleeding, trouble breathing or pain that is not related to your normal pain.  Depending on the type of procedure that was done, some parts of your body may feel week and/or numb.  This usually clears up by tonight or the next day.  Walk with the use of an assistive device or accompanied by an adult for the 24 hours.  You may use ice on the affected area for the first 24 hours.  Put ice in a Ziploc bag and cover with a towel and place against area 15 minutes on 15 minutes off.  You may switch to heat after 24 hours. 

## 2016-01-30 NOTE — Progress Notes (Signed)
Patient's Name: Richard Cook  Patient type: Established  MRN: [id number]  Service setting: Ambulatory outpatient  DOB: 05/22/1956  Location: [facility]  DOS: 01/30/2016  Primary Care Physician: Richard Huh, MD  Note by: Richard Cook, M.D, DABA, DABAPM, DABPM, DABIPP, FIPP  Referring Physician: Birdie Sons, MD  Specialty: Board-Certified Interventional Pain Management  Last Visit to Pain Management: 01/13/2016   Primary Reason(s) for Visit: Interventional Pain Management Treatment. CC: Back Pain  Primary Diagnosis: Chronic pain of left lower extremity [M79.605, G89.29]   Procedure:  Anesthesia, Analgesia, Anxiolysis:  Type: Therapeutic Inter-Laminar Epidural Steroid Injection Region: Lumbar Level: L4-5 Level. Laterality: Left Paramedial  Indications: 1. Chronic lower extremity pain (referred pain pattern) (Location of Secondary source of pain) (Left)   2. Osteoarthritis of spine with radiculopathy, lumbar region     Pre-procedure Pain Score: 9/10 Reported level of pain is compatible with clinical observations Post-procedure Pain Score: 1   Type: Local Anesthesia Local Anesthetic: Lidocaine 1% Route: Infiltration (Talent/IM) IV Access: Declined Sedation: Declined  Indication(s): Analgesia          Pre-Procedure Assessment:  Richard Cook is a 60 y.o. year old, male patient, seen today for interventional treatment. 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[place]); Hypercholesterolemia; GERD (gastroesophageal reflux disease); Hypotension; SVT (supraventricular tachycardia) ([place]); Hyperkalemia; Ischemic cardiomyopathy; Bradycardia; Chronic combined systolic and diastolic CHF (congestive heart failure) ([place]); Ventricular tachycardia ([place]); CAD in native artery; Anxiety state; Insomnia; Essential (primary) hypertension; CAD (coronary artery disease); Allergic rhinitis; Chronic kidney disease (CKD), stage III (moderate); Edema;  Extremity pain; Nocturnal hypoxemia; Chronic low back pain (Location of Primary Source of Pain) (Left); Cellulitis of groin; C. difficile colitis; Fatigue; Tick-borne fever; H/O acute myocardial infarction; Combined fat and carbohydrate induced hyperlipemia; Feeling bilious; Cutaneous eruption; Open leg wound; Lumbar spondylosis (L4-5 and L5-S1 bulging disks); Lumbar facet syndrome (Location of Primary Source of Pain) (Bilateral) (L>R); Chronic lower extremity pain (referred pain pattern) (Location of Secondary source of pain) (Left); Chronic pain; Muscle spasm of back; Gout, renal disease; Elevated C-reactive protein (CRP); Chronic kidney disease, stage III (moderate); Elevated sedimentation rate; and Hypomagnesemia on his problem list.. His primarily concern today is the Back Pain   Pain Type: Chronic pain Pain Location: Back Pain Orientation: Left, Lower Pain Descriptors / Indicators: Spasm, Sharp Pain Frequency: Constant  Date of Last Visit: 01/13/16 Service Provided on Last Visit: Evaluation  Coagulation Parameters Lab Results  Component Value Date   INR 1.21 02/22/2015   LABPROT 15.5* 02/22/2015   APTT 29 02/22/2015   PLT 241 10/21/2015    Verification of the correct person, correct site (including marking of site), and correct procedure were performed and confirmed by the patient.  Consent: Secured. Under the influence of no sedatives a written informed consent was obtained, after having provided information on the risks and possible complications. To fulfill our ethical and legal obligations, as recommended by the American Medical Association's Code of Ethics, we have provided information to the patient about our clinical impression; the nature and purpose of the treatment or procedure; the risks, benefits, and possible complications of the intervention; alternatives; the risk(s) and benefit(s) of the alternative treatment(s) or procedure(s); and the risk(s) and benefit(s) of doing  nothing. The patient was provided information about the risks and possible complications associated with the procedure. These include, but are not limited to, failure to achieve desired goals, infection, bleeding, organ or nerve damage, allergic reactions, paralysis, and  death. In the case of spinal procedures these may include, but are not limited to, failure to achieve desired goals, infection, bleeding, organ or nerve damage, allergic reactions, paralysis, and death. In addition, the patient was informed that Medicine is not an exact science; therefore, there is also the possibility of unforeseen risks and possible complications that may result in a catastrophic outcome. The patient indicated having understood very clearly. We have given the patient no guarantees and we have made no promises. Enough time was given to the patient to ask questions, all of which were answered to the patient's satisfaction.  Consent Attestation: I, the ordering provider, attest that I have discussed with the patient the benefits, risks, side-effects, alternatives, likelihood of achieving goals, and potential problems during recovery for the procedure that I have provided informed consent.  Pre-Procedure Preparation: Safety Precautions: Allergies reviewed. Appropriate site, procedure, and patient were confirmed by following the Joint Commission's Universal Protocol (UP.01.01.01), in the form of a "Time Out". The patient was asked to confirm marked site and procedure, before commencing. The patient was asked about blood thinners, or active infections, both of which were denied. Patient was assessed for positional comfort and all pressure points were checked before starting procedure. Allergies: He is allergic to codeine.. Infection Control Precautions: Sterile technique used. Standard Universal Precautions were taken as recommended by the Department of [facility] for Disease Control and Prevention (CDC). Standard  pre-surgical skin prep was conducted. Respiratory hygiene and cough etiquette was practiced. Hand hygiene observed. Safe injection practices and needle disposal techniques followed. SDV (single dose vial) medications used. Medications properly checked for expiration dates and contaminants. Personal protective equipment (PPE) used: Surgical mask. Sterile Radiation-resistant gloves. Monitoring:  As per clinic protocol. Filed Vitals:   01/30/16 1452 01/30/16 1457 01/30/16 1500 01/30/16 1505  BP: 112/80 110/78 100/62 117/78  Pulse: 73 71 71 72  Temp:      TempSrc:      Resp: '11 15 12 13  ' Height:      Weight:      SpO2: 97% 97% 97% 97%  Calculated BMI: Body mass index is 25.83 kg/(m^2).  Description of Procedure Process:  Time-out: "Time-out" completed before starting procedure, as per protocol. Position: Prone Target Area: For Epidural Steroid injections, the target area is the  interlaminar space, initially targeting the lower border of the superior vertebral body lamina. Approach: Posterior approach. Area Prepped: Entire Posterior Lumbosacral Region Prepping solution: ChloraPrep (2% chlorhexidine gluconate and 70% isopropyl alcohol) Safety Precautions: Aspiration looking for blood return was conducted prior to all injections. At no point did we inject any substances, as a needle was being advanced. No attempts were made at seeking any paresthesias. Safe injection practices and needle disposal techniques used. Medications properly checked for expiration dates. SDV (single dose vial) medications used.         Description of the Procedure: Protocol guidelines were followed. The patient was placed in position over the fluoroscopy table. The target area was identified and the area prepped in the usual manner. Skin desensitized using vapocoolant spray. Skin & deeper tissues infiltrated with local anesthetic. Appropriate amount of time allowed to pass for local anesthetics to take effect. The procedure  needle was introduced through the skin, ipsilateral to the reported pain, and advanced to the target area. Bone was contacted and the needle walked caudad, until the lamina was cleared. The epidural space was identified using "loss-of-resistance technique" with 2-3 ml of PF-NaCl (0.9% NSS), in a 5cc LOR  glass syringe. Proper needle placement secured. Negative aspiration confirmed. Solution injected in intermittent fashion, asking for systemic symptoms every 0.5cc of injectate. The needles were then removed and the area cleansed, making sure to leave some of the prepping solution back to take advantage of its long term bactericidal properties. EBL: None Materials & Medications Used:  Needle(s) Used: 20g - 10cm, Tuohy-style epidural needle Medication(s): Please see chart orders for medication and dosing details.  Imaging Guidance:  Type of Imaging Technique: Fluoroscopy Guidance (Spinal) Indication(s): Assistance in needle guidance and placement for procedures requiring needle placement in or near specific anatomical locations not easily accessible without such assistance. Exposure Time: Please see nurses notes. Contrast: Before injecting any contrast, we confirmed that the patient did not have an allergy to iodine, shellfish, or radiological contrast. Once satisfactory needle placement was completed at the desired level, radiological contrast was injected. Injection was conducted under continuous fluoroscopic guidance. Injection of contrast accomplished without complications. See chart for type and volume of contrast used. Fluoroscopic Guidance: I was personally present in the fluoroscopy suite, where the patient was placed in position for the procedure, over the fluoroscopy-compatible table. Fluoroscopy was manipulated, using "Tunnel Vision Technique", to obtain the best possible view of the target area, on the affected side. Parallax error was corrected before commencing the procedure. A  "direction-depth-direction" technique was used to introduce the needle under continuous pulsed fluoroscopic guidance. Once the target was reached, antero-posterior, oblique, and lateral fluoroscopic projection views were taken to confirm needle placement in all planes. Permanently recorded images stored by scanning into EMR. Interpretation: Intraoperative imaging interpretation by performing Physician. Adequate needle placement confirmed in AP & Oblique Views. Appropriate spread of contrast to desired area. No evidence of afferent or efferent intravascular uptake. No intrathecal or subarachnoid spread observed. Permanent images scanned into the patient's record.  Antibiotic Prophylaxis:  Indication(s): No indications identified. Type:  Antibiotics Given (last 72 hours)    None       Post-operative Assessment:   Complications: No immediate post-treatment complications were observed. Relevant Post-operative Information:  Disposition: Return to clinic for follow-up evaluation. The patient tolerated the entire procedure well. A repeat set of vitals were taken after the procedure and the patient was kept under observation following institutional policy, for this procedure. Post-procedural neurological assessment was performed, showing return to baseline, prior to discharge. The patient was discharged home, once institutional criteria were met. The patient was provided with post-procedure discharge instructions, including a section on how to identify potential problems. Should any problems arise concerning this procedure, the patient was given instructions to immediately contact us, at any time, without hesitation. In any case, we plan to contact the patient by telephone for a follow-up status report regarding this interventional procedure. Comments:  No additional relevant information.  Medications administered during this visit: We administered iopamidol, triamcinolone acetonide, lidocaine (PF), sodium  chloride flush, and ropivacaine (PF) 2 mg/ml (0.2%).  Prescriptions ordered during this visit: New Prescriptions   No medications on file    Future Appointments Date Time Provider [place]  03/02/2016 10:20 AM Milinda Pointer, MD [facility] None  04/21/2016 8:05 AM [facility] DEVICE REMOTES [facility]    Primary Care Physician: Richard Huh, MD Location: [facility] Note by: Richard Cook, M.D, DABA, DABAPM, DABPM, DABIPP, FIPP  Disclaimer:  Medicine is not an exact science. The only guarantee in medicine is that nothing is guaranteed. It is important to note that the decision to proceed with this intervention  was based on the information collected from the patient. The Data and conclusions were drawn from the patient's questionnaire, the interview, and the physical examination. Because the information was provided in large part by the patient, it cannot be guaranteed that it has not been purposely or unconsciously manipulated. Every effort has been made to obtain as much relevant data as possible for this evaluation. It is important to note that the conclusions that lead to this procedure are derived in large part from the available data. Always take into account that the treatment will also be dependent on availability of resources and existing treatment guidelines, considered by other Pain Management Practitioners as being common knowledge and practice, at the time of the intervention. For Medico-Legal purposes, it is also important to point out that variation in procedural techniques and pharmacological choices are the acceptable norm. The indications, contraindications, technique, and results of the above procedure should only be interpreted and judged by a Board-Certified Interventional Pain Specialist with extensive familiarity and expertise in the same exact procedure and technique. Attempts at providing opinions without similar or  greater experience and expertise than that of the treating physician will be considered as inappropriate and unethical, and shall result in a formal complaint to the state medical board and applicable specialty societies.

## 2016-01-30 NOTE — Progress Notes (Signed)
Safety precautions to be maintained throughout the outpatient stay will include: orient to surroundings, keep bed in low position, maintain call bell within reach at all times, provide assistance with transfer out of bed and ambulation.  

## 2016-01-31 ENCOUNTER — Telehealth: Payer: Self-pay | Admitting: *Deleted

## 2016-01-31 ENCOUNTER — Telehealth: Payer: Self-pay

## 2016-01-31 ENCOUNTER — Other Ambulatory Visit: Payer: Self-pay | Admitting: Family Medicine

## 2016-01-31 NOTE — Telephone Encounter (Signed)
Pt contacted office for refill request on the following medications:  Lactullose 10gm/15 solution.  [facility] [address]  [place].  [phone]  Pt states Dr Caryn Section gave him a Rx the last time he was in/[place]

## 2016-01-31 NOTE — Telephone Encounter (Signed)
Left voicemail, returning patient call.  °

## 2016-01-31 NOTE — Telephone Encounter (Signed)
Please call pt back about pain score Dr Dossie Arbour

## 2016-01-31 NOTE — Telephone Encounter (Signed)
No problems post procedure. 

## 2016-01-31 NOTE — Telephone Encounter (Signed)
Last fill 09/17/15 for chronic constipation-aa

## 2016-01-31 NOTE — Telephone Encounter (Signed)
Attempted to call patient, message left. 

## 2016-02-05 MED ORDER — LACTULOSE 10 GM/15ML PO SOLN
ORAL | Status: DC
Start: 2016-02-05 — End: 2016-03-02

## 2016-03-02 ENCOUNTER — Ambulatory Visit: Payer: Medicaid Other | Attending: Pain Medicine | Admitting: Pain Medicine

## 2016-03-02 ENCOUNTER — Encounter: Payer: Self-pay | Admitting: Pain Medicine

## 2016-03-02 VITALS — BP 97/77 | HR 74 | Temp 98.2°F | Resp 16 | Ht 70.0 in | Wt 175.0 lb

## 2016-03-02 DIAGNOSIS — I471 Supraventricular tachycardia: Secondary | ICD-10-CM | POA: Insufficient documentation

## 2016-03-02 DIAGNOSIS — F411 Generalized anxiety disorder: Secondary | ICD-10-CM | POA: Insufficient documentation

## 2016-03-02 DIAGNOSIS — M546 Pain in thoracic spine: Secondary | ICD-10-CM | POA: Diagnosis present

## 2016-03-02 DIAGNOSIS — F419 Anxiety disorder, unspecified: Secondary | ICD-10-CM | POA: Diagnosis not present

## 2016-03-02 DIAGNOSIS — M5126 Other intervertebral disc displacement, lumbar region: Secondary | ICD-10-CM | POA: Insufficient documentation

## 2016-03-02 DIAGNOSIS — N183 Chronic kidney disease, stage 3 (moderate): Secondary | ICD-10-CM | POA: Insufficient documentation

## 2016-03-02 DIAGNOSIS — E782 Mixed hyperlipidemia: Secondary | ICD-10-CM | POA: Diagnosis not present

## 2016-03-02 DIAGNOSIS — Z981 Arthrodesis status: Secondary | ICD-10-CM | POA: Diagnosis not present

## 2016-03-02 DIAGNOSIS — A047 Enterocolitis due to Clostridium difficile: Secondary | ICD-10-CM | POA: Insufficient documentation

## 2016-03-02 DIAGNOSIS — G4700 Insomnia, unspecified: Secondary | ICD-10-CM | POA: Diagnosis not present

## 2016-03-02 DIAGNOSIS — E7800 Pure hypercholesterolemia, unspecified: Secondary | ICD-10-CM | POA: Diagnosis not present

## 2016-03-02 DIAGNOSIS — I252 Old myocardial infarction: Secondary | ICD-10-CM | POA: Insufficient documentation

## 2016-03-02 DIAGNOSIS — M545 Low back pain, unspecified: Secondary | ICD-10-CM | POA: Insufficient documentation

## 2016-03-02 DIAGNOSIS — L03314 Cellulitis of groin: Secondary | ICD-10-CM | POA: Diagnosis not present

## 2016-03-02 DIAGNOSIS — I2510 Atherosclerotic heart disease of native coronary artery without angina pectoris: Secondary | ICD-10-CM | POA: Insufficient documentation

## 2016-03-02 DIAGNOSIS — I255 Ischemic cardiomyopathy: Secondary | ICD-10-CM | POA: Diagnosis not present

## 2016-03-02 DIAGNOSIS — J309 Allergic rhinitis, unspecified: Secondary | ICD-10-CM | POA: Diagnosis not present

## 2016-03-02 DIAGNOSIS — I472 Ventricular tachycardia: Secondary | ICD-10-CM | POA: Insufficient documentation

## 2016-03-02 DIAGNOSIS — R7982 Elevated C-reactive protein (CRP): Secondary | ICD-10-CM | POA: Insufficient documentation

## 2016-03-02 DIAGNOSIS — E875 Hyperkalemia: Secondary | ICD-10-CM | POA: Insufficient documentation

## 2016-03-02 DIAGNOSIS — M109 Gout, unspecified: Secondary | ICD-10-CM | POA: Diagnosis not present

## 2016-03-02 DIAGNOSIS — M47816 Spondylosis without myelopathy or radiculopathy, lumbar region: Secondary | ICD-10-CM | POA: Diagnosis not present

## 2016-03-02 DIAGNOSIS — M62830 Muscle spasm of back: Secondary | ICD-10-CM | POA: Diagnosis not present

## 2016-03-02 DIAGNOSIS — G8929 Other chronic pain: Secondary | ICD-10-CM | POA: Insufficient documentation

## 2016-03-02 DIAGNOSIS — A938 Other specified arthropod-borne viral fevers: Secondary | ICD-10-CM | POA: Insufficient documentation

## 2016-03-02 DIAGNOSIS — Z955 Presence of coronary angioplasty implant and graft: Secondary | ICD-10-CM | POA: Insufficient documentation

## 2016-03-02 DIAGNOSIS — Z951 Presence of aortocoronary bypass graft: Secondary | ICD-10-CM | POA: Insufficient documentation

## 2016-03-02 DIAGNOSIS — K219 Gastro-esophageal reflux disease without esophagitis: Secondary | ICD-10-CM | POA: Insufficient documentation

## 2016-03-02 DIAGNOSIS — I5042 Chronic combined systolic (congestive) and diastolic (congestive) heart failure: Secondary | ICD-10-CM | POA: Insufficient documentation

## 2016-03-02 DIAGNOSIS — I130 Hypertensive heart and chronic kidney disease with heart failure and stage 1 through stage 4 chronic kidney disease, or unspecified chronic kidney disease: Secondary | ICD-10-CM | POA: Insufficient documentation

## 2016-03-02 MED ORDER — ROPIVACAINE HCL 2 MG/ML IJ SOLN: 9.0000 mL | Freq: Once | INTRAMUSCULAR | Status: DC

## 2016-03-02 MED ORDER — TRIAMCINOLONE ACETONIDE 40 MG/ML IJ SUSP
INTRAMUSCULAR | Status: AC
  Administered 2016-03-02: 12:00:00
  Filled 2016-03-02: qty 1

## 2016-03-02 MED ORDER — ROPIVACAINE HCL 2 MG/ML IJ SOLN
INTRAMUSCULAR | Status: AC
  Administered 2016-03-02: 12:00:00
  Filled 2016-03-02: qty 10

## 2016-03-02 MED ORDER — TRIAMCINOLONE ACETONIDE 40 MG/ML IJ SUSP: 40.0000 mg | Freq: Once | INTRAMUSCULAR | Status: DC

## 2016-03-02 MED ORDER — LIDOCAINE HCL (PF) 1 % IJ SOLN
INTRAMUSCULAR | Status: AC
  Administered 2016-03-02: 12:00:00
  Filled 2016-03-02: qty 5

## 2016-03-02 NOTE — Patient Instructions (Signed)
Pain Management Discharge Instructions  General Discharge Instructions :  If you need to reach your doctor call: Monday-Friday 8:00 am - 4:00 pm at [phone] or toll free [phone].  After clinic hours [phone] to have operator reach doctor.  Bring all of your medication bottles to all your appointments in the pain clinic.  To cancel or reschedule your appointment with Pain Management please remember to call 24 hours in advance to avoid a fee.  Refer to the educational materials which you have been given on: General Risks, I had my Procedure. Discharge Instructions, Post Sedation.  Post Procedure Instructions:  The drugs you were given will stay in your system until tomorrow, so for the next 24 hours you should not drive, make any legal decisions or drink any alcoholic beverages.  You may eat anything you prefer, but it is better to start with liquids then soups and crackers, and gradually work up to solid foods.  Please notify your doctor immediately if you have any unusual bleeding, trouble breathing or pain that is not related to your normal pain.  Depending on the type of procedure that was done, some parts of your body may feel week and/or numb.  This usually clears up by tonight or the next day.  Walk with the use of an assistive device or accompanied by an adult for the 24 hours.  You may use ice on the affected area for the first 24 hours.  Put ice in a Ziploc bag and cover with a towel and place against area 15 minutes on 15 minutes off.  You may switch to heat after 24 hours.Trigger Point Injections Patient Information  Description: Trigger points are areas of muscle sensitive to touch which cause pain with movement, sometimes felt some distance from the site of palpation.  Usually the muscle containing these trigger points if felt as a tight band or knot.   The area of maximum tenderness or trigger point is identified, and after antiseptic preparation of the skin, a  small needle is placed into this site.  Reproduction of the pain often occurs and numbing medicine (local anesthetic) is injected into the site, sometimes along with steroid preparation.  The entire block usually lasts less than 5 minutes.  Conditions which may be treated by trigger points:   Muscular pain and spasm  Nerve irritation  Preparation for the injection:  1. Do not eat any solid food or dairy products within 8 hours of your appointment. 2. You may drink clear liquids up to 3 hours before appointment.  Clear liquids include water, black coffee, juice or soda.  No milk or cream please. 3. You may take your regular medications, including pain medications, with a sip of water before your appointment.  Diabetics should hold regular insulin ( if take separately) and take 1/2 normal NPH dose the morning of the procedure.  Carry some sugar containing items with you to your appointment. 4. A driver must accompany you and be prepared to drive you home after your procedure.  5. Bring all your current medications with you. 6. An IV may be inserted and sedation may be given at the discretion of the physician.  7. A blood pressure cuff, EKG, and other monitors will often be applied during the procedure.  Some patients may need to have extra oxygen administered for a short period. 8. You will be asked to provide medical information, including your allergies and medications, prior to the procedure.  We must know immediately if you  are taking blood thinners (like Coumadin/Warfarin) or if you are allergic to IV iodine contrast (dye).  We must know if you could possibly be pregnant.  Possible side-effects:   Bleeding from needle site  Infection (rare, may require surgery)  Nerve injury (rare)  Numbness & tingling (temporary)  Punctured lung (if injection around chest)  Light-headedness (temporary)  Pain at injection site (several days)  Decreased blood pressure (rare,  temporary)  Weakness in arm/leg (temporary)  Call if you experience:   Hive or difficulty breathing (go to the emergency room)  Inflammation or drainage at the injection site(s)  Please note:  Although the local anesthetic injected can often make your painful muscle feel good for several hours after the injection, the pain may return.  It takes 3-7 days for steroids to work.  You may not notice any pain relief for at least one week.  If effective, we will often do a series of injections spaced 3-6 weeks apart to maximally decrease your pain.  If you have any questions please call [phone] [facility]

## 2016-03-02 NOTE — Progress Notes (Signed)
Safety precautions to be maintained throughout the outpatient stay will include: orient to surroundings, keep bed in low position, maintain call bell within reach at all times, provide assistance with transfer out of bed and ambulation.  Patient here today post procedure eval 

## 2016-03-02 NOTE — Progress Notes (Signed)
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  If effective, we will often do a series of injections spaced 3-6 weeks apart to maximally decrease your pain.  If you have any questions please call [phone] [facility]

## 2016-03-09 NOTE — Telephone Encounter (Signed)
eror

## 2016-03-10 ENCOUNTER — Other Ambulatory Visit: Payer: Self-pay | Admitting: Family Medicine

## 2016-03-10 ENCOUNTER — Telehealth: Payer: Self-pay | Admitting: Family Medicine

## 2016-03-10 DIAGNOSIS — M5489 Other dorsalgia: Secondary | ICD-10-CM

## 2016-03-10 MED ORDER — HYDROCODONE-ACETAMINOPHEN 10-325 MG PO TABS: 1.0000 | ORAL_TABLET | Freq: Four times a day (QID) | ORAL | 0 refills | Status: DC | PRN

## 2016-03-10 NOTE — Telephone Encounter (Signed)
Pt needs refill on his hydrocodone 10/325.  Please advise when ready  His call back is [phone]  Thanks [name]

## 2016-03-10 NOTE — Telephone Encounter (Signed)
Pt called for a refill on his hydrocodone 10/325.  Please advise when ready to pick up  [phone]  [facility]

## 2016-03-17 ENCOUNTER — Ambulatory Visit (INDEPENDENT_AMBULATORY_CARE_PROVIDER_SITE_OTHER): Payer: Medicaid Other | Admitting: Family Medicine

## 2016-03-17 ENCOUNTER — Ambulatory Visit: Payer: Self-pay | Admitting: Family Medicine

## 2016-03-17 ENCOUNTER — Encounter: Payer: Self-pay | Admitting: Family Medicine

## 2016-03-17 VITALS — BP 102/80 | HR 80 | Temp 98.3°F | Resp 16 | Wt 188.0 lb

## 2016-03-17 DIAGNOSIS — K4090 Unilateral inguinal hernia, without obstruction or gangrene, not specified as recurrent: Secondary | ICD-10-CM

## 2016-03-17 NOTE — Progress Notes (Signed)
Patient: Richard Cook Male    DOB: 1956-06-24   60 y.o.   MRN: [id number] Visit Date: 03/17/2016  Today's Provider: Lelon Huh, MD   Chief Complaint  Patient presents with  . Groin Pain    x 1 month   Subjective:    Groin Pain  Primary symptoms comment: right groin pain. This is a new problem. Episode onset: 1 month ago. The problem occurs daily. The problem has been gradually worsening. The pain is severe. Associated symptoms include abdominal pain. Pertinent negatives include no chest pain, chills, dysuria, fever, flank pain, frequency, nausea, shortness of breath, urgency or vomiting. Exacerbated by: sitting. Treatments tried: streatching exercises. The treatment provided mild relief.  Patient states the pain worsened 2 weeks ago while moving his right leg while sitting in a swivel chair. Patient describes the pain as a sharp burning pain. Patient also has some tenderness in the right lower quadrant of the abdomen.      Allergies  Allergen Reactions  . Codeine Nausea Only    Tolerates hydrocodone   Current Meds  Medication Sig  . allopurinol (ZYLOPRIM) 300 MG tablet Take 1 tablet (300 mg total) by mouth daily.  Marland Kitchen aspirin EC 81 MG tablet Take 81 mg by mouth daily.  Marland Kitchen atorvastatin (LIPITOR) 20 MG tablet Take 1 tablet (20 mg total) by mouth daily.  . carvedilol (COREG) 6.25 MG tablet TAKE ONE TABLET BY MOUTH EVERY 12 HOURS  . clonazePAM (KLONOPIN) 2 MG tablet TAKE 1/2 TO 1 TABLETS BY MOUTH 3 TIMES A DAY AS NEEDED FOR ANXIETY  . furosemide (LASIX) 40 MG tablet Take two tablets (40 mg) by mouth daily as directed  . hydrALAZINE (APRESOLINE) 25 MG tablet Take one tablet by mouth twice daily  . HYDROcodone-acetaminophen (NORCO) 10-325 MG tablet Take 1 tablet by mouth every 6 (six) hours as needed for moderate pain.  . isosorbide mononitrate (IMDUR) 30 MG 24 hr tablet Take 1 tablet (30 mg total) by mouth daily.   Current Facility-Administered Medications for the  03/17/16 encounter (Office Visit) with Birdie Sons, MD  Medication  . ropivacaine (PF) 2 mg/ml (0.2%) (NAROPIN) epidural 9 mL  . triamcinolone acetonide (KENALOG-40) injection 40 mg    Review of Systems  Constitutional: Negative for appetite change, chills and fever.  Respiratory: Negative for chest tightness, shortness of breath and wheezing.   Cardiovascular: Negative for chest pain and palpitations.  Gastrointestinal: Positive for abdominal pain. Negative for nausea and vomiting.  Genitourinary: Negative for decreased urine volume, difficulty urinating, dysuria, enuresis, flank pain, frequency, genital sores, hematuria, penile swelling and urgency.       Right groin pain    Social History  Substance Use Topics  . Smoking status: Never Smoker  . Smokeless tobacco: Never Used  . Alcohol use No   Objective:   BP 102/80   Pulse 80   Temp 98.3 F (36.8 C) (Oral)   Resp 16   Wt 188 lb (85.3 kg)   SpO2 96% Comment: room air  BMI 26.98 kg/m   Physical Exam  General Appearance:    Alert, cooperative, no distress  Eyes:    PERRL, conjunctiva/corneas clear, EOM's intact       Lungs:     Clear to auscultation bilaterally, respirations unlabored  Heart:    Regular rate and rhythm  Abdomen:   Medium sized right inguinal bulge with hernia on palpation. .         Assessment &  Plan:     1. Unilateral inguinal hernia without obstruction or gangrene, recurrence not specified  - Ambulatory referral to [place], MD  [facility]

## 2016-03-17 NOTE — Patient Instructions (Signed)
Try using an inguinal truss, available at most pharmacies or medical supply stores.   Inguinal Hernia, Adult Muscles help keep everything in the body in its proper place. But if a weak spot in the muscles develops, something can poke through. That is called a hernia. When this happens in the lower part of the belly (abdomen), it is called an inguinal hernia. (It takes its name from a part of the body in this region called the inguinal canal.) A weak spot in the wall of muscles lets some fat or part of the small intestine bulge through. An inguinal hernia can develop at any age. Men get them more often than women. CAUSES  In adults, an inguinal hernia develops over time.  It can be triggered by:  Suddenly straining the muscles of the lower abdomen.  Lifting heavy objects.  Straining to have a bowel movement. Difficult bowel movements (constipation) can lead to this.  Constant coughing. This may be caused by smoking or lung disease.  Being overweight.  Being pregnant.  Working at a job that requires long periods of standing or heavy lifting.  Having had an inguinal hernia before. One type can be an emergency situation. It is called a strangulated inguinal hernia. It develops if part of the small intestine slips through the weak spot and cannot get back into the abdomen. The blood supply can be cut off. If that happens, part of the intestine may die. This situation requires emergency surgery. SYMPTOMS  Often, a small inguinal hernia has no symptoms. It is found when a healthcare provider does a physical exam. Larger hernias usually have symptoms.   In adults, symptoms may include:  A lump in the groin. This is easier to see when the person is standing. It might disappear when lying down.  In men, a lump in the scrotum.  Pain or burning in the groin. This occurs especially when lifting, straining or coughing.  A dull ache or feeling of pressure in the groin.  Signs of a  strangulated hernia can include:  A bulge in the groin that becomes very painful and tender to the touch.  A bulge that turns red or purple.  Fever, nausea and vomiting.  Inability to have a bowel movement or to pass gas. DIAGNOSIS  To decide if you have an inguinal hernia, a healthcare provider will probably do a physical examination.  This will include asking questions about any symptoms you have noticed.  The healthcare provider might feel the groin area and ask you to cough. If an inguinal hernia is felt, the healthcare provider may try to slide it back into the abdomen.  Usually no other tests are needed. TREATMENT  Treatments can vary. The size of the hernia makes a difference. Options include:  Watchful waiting. This is often suggested if the hernia is small and you have had no symptoms.  No medical procedure will be done unless symptoms develop.  You will need to watch closely for symptoms. If any occur, contact your healthcare provider right away.  Surgery. This is used if the hernia is larger or you have symptoms.  Open surgery. This is usually an outpatient procedure (you will not stay overnight in a hospital). An cut (incision) is made through the skin in the groin. The hernia is put back inside the abdomen. The weak area in the muscles is then repaired by herniorrhaphy or hernioplasty. Herniorrhaphy: in this type of surgery, the weak muscles are sewn back together. Hernioplasty: a patch  or mesh is used to close the weak area in the abdominal wall.  Laparoscopy. In this procedure, a surgeon makes small incisions. A thin tube with a tiny video camera (called a laparoscope) is put into the abdomen. The surgeon repairs the hernia with mesh by looking with the video camera and using two long instruments. HOME CARE INSTRUCTIONS   After surgery to repair an inguinal hernia:  You will need to take pain medicine prescribed by your healthcare provider. Follow all directions  carefully.  You will need to take care of the wound from the incision.  Your activity will be restricted for awhile. This will probably include no heavy lifting for several weeks. You also should not do anything too active for a few weeks. When you can return to work will depend on the type of job that you have.  During "watchful waiting" periods, you should:  Maintain a healthy weight.  Eat a diet high in fiber (fruits, vegetables and whole grains).  Drink plenty of fluids to avoid constipation. This means drinking enough water and other liquids to keep your urine clear or pale yellow.  Do not lift heavy objects.  Do not stand for long periods of time.  Quit smoking. This should keep you from developing a frequent cough. SEEK MEDICAL CARE IF:   A bulge develops in your groin area.  You feel pain, a burning sensation or pressure in the groin. This might be worse if you are lifting or straining.  You develop a fever of more than 100.5 F (38.1 C). SEEK IMMEDIATE MEDICAL CARE IF:   Pain in the groin increases suddenly.  A bulge in the groin gets bigger suddenly and does not go down.  For men, there is sudden pain in the scrotum. Or, the size of the scrotum increases.  A bulge in the groin area becomes red or purple and is painful to touch.  You have nausea or vomiting that does not go away.  You feel your heart beating much faster than normal.  You cannot have a bowel movement or pass gas.  You develop a fever of more than 102.0 F (38.9 C).   This information is not intended to replace advice given to you by your health care provider. Make sure you discuss any questions you have with your health care provider.   Document Released: 12/06/2008 Document Revised: 10/12/2011 Document Reviewed: 01/21/2015 Elsevier Interactive Patient Education [employer].

## 2016-03-19 ENCOUNTER — Telehealth: Payer: Self-pay | Admitting: Family Medicine

## 2016-03-19 NOTE — Telephone Encounter (Signed)
Walking and hiking are fine so long as he is not having significant pain. Avoid strenuous lifting... No more than 25 pounds.

## 2016-03-19 NOTE — Telephone Encounter (Signed)
Please advise 

## 2016-03-19 NOTE — Telephone Encounter (Signed)
Patient was notified.

## 2016-03-19 NOTE — Telephone Encounter (Signed)
Pt called saying he was in for a hernia this week.  He is using the band "truss"?  that you reccommended.  It is helping a lot.  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  He wants to know if you think it is ok for him to do these things as long as he is wearing that and not in pain or can he make his hernia worse.  His call back is  [phone]  Thanks, [name]

## 2016-04-01 ENCOUNTER — Emergency Department (HOSPITAL_COMMUNITY): Payer: Medicaid Other

## 2016-04-01 ENCOUNTER — Encounter (HOSPITAL_COMMUNITY): Payer: Self-pay | Admitting: Emergency Medicine

## 2016-04-01 ENCOUNTER — Inpatient Hospital Stay (HOSPITAL_COMMUNITY)
Admission: EM | Admit: 2016-04-01 | Discharge: 2016-04-04 | DRG: 872 | Disposition: A | Discharge status: Home / Self Care | Payer: Medicaid Other | Attending: Internal Medicine | Admitting: Internal Medicine

## 2016-04-01 DIAGNOSIS — E785 Hyperlipidemia, unspecified: Secondary | ICD-10-CM | POA: Diagnosis present

## 2016-04-01 DIAGNOSIS — M109 Gout, unspecified: Secondary | ICD-10-CM | POA: Diagnosis present

## 2016-04-01 DIAGNOSIS — N184 Chronic kidney disease, stage 4 (severe): Secondary | ICD-10-CM | POA: Diagnosis present

## 2016-04-01 DIAGNOSIS — N179 Acute kidney failure, unspecified: Secondary | ICD-10-CM | POA: Diagnosis present

## 2016-04-01 DIAGNOSIS — I252 Old myocardial infarction: Secondary | ICD-10-CM

## 2016-04-01 DIAGNOSIS — L03116 Cellulitis of left lower limb: Secondary | ICD-10-CM | POA: Diagnosis present

## 2016-04-01 DIAGNOSIS — Z7982 Long term (current) use of aspirin: Secondary | ICD-10-CM

## 2016-04-01 DIAGNOSIS — N185 Chronic kidney disease, stage 5: Secondary | ICD-10-CM | POA: Diagnosis present

## 2016-04-01 DIAGNOSIS — R197 Diarrhea, unspecified: Secondary | ICD-10-CM | POA: Diagnosis not present

## 2016-04-01 DIAGNOSIS — Z8249 Family history of ischemic heart disease and other diseases of the circulatory system: Secondary | ICD-10-CM

## 2016-04-01 DIAGNOSIS — R6520 Severe sepsis without septic shock: Secondary | ICD-10-CM | POA: Diagnosis present

## 2016-04-01 DIAGNOSIS — Z79899 Other long term (current) drug therapy: Secondary | ICD-10-CM

## 2016-04-01 DIAGNOSIS — L0390 Cellulitis, unspecified: Secondary | ICD-10-CM | POA: Diagnosis present

## 2016-04-01 DIAGNOSIS — I5042 Chronic combined systolic (congestive) and diastolic (congestive) heart failure: Secondary | ICD-10-CM | POA: Diagnosis present

## 2016-04-01 DIAGNOSIS — Z951 Presence of aortocoronary bypass graft: Secondary | ICD-10-CM

## 2016-04-01 DIAGNOSIS — Z955 Presence of coronary angioplasty implant and graft: Secondary | ICD-10-CM | POA: Diagnosis not present

## 2016-04-01 DIAGNOSIS — I2510 Atherosclerotic heart disease of native coronary artery without angina pectoris: Secondary | ICD-10-CM | POA: Diagnosis present

## 2016-04-01 DIAGNOSIS — I130 Hypertensive heart and chronic kidney disease with heart failure and stage 1 through stage 4 chronic kidney disease, or unspecified chronic kidney disease: Secondary | ICD-10-CM | POA: Diagnosis present

## 2016-04-01 DIAGNOSIS — I255 Ischemic cardiomyopathy: Secondary | ICD-10-CM | POA: Diagnosis present

## 2016-04-01 DIAGNOSIS — F411 Generalized anxiety disorder: Secondary | ICD-10-CM | POA: Diagnosis present

## 2016-04-01 DIAGNOSIS — M069 Rheumatoid arthritis, unspecified: Secondary | ICD-10-CM | POA: Diagnosis present

## 2016-04-01 DIAGNOSIS — M546 Pain in thoracic spine: Secondary | ICD-10-CM

## 2016-04-01 DIAGNOSIS — A419 Sepsis, unspecified organism: Principal | ICD-10-CM | POA: Diagnosis present

## 2016-04-01 DIAGNOSIS — Z811 Family history of alcohol abuse and dependence: Secondary | ICD-10-CM | POA: Diagnosis not present

## 2016-04-01 DIAGNOSIS — E876 Hypokalemia: Secondary | ICD-10-CM | POA: Diagnosis present

## 2016-04-01 DIAGNOSIS — G4730 Sleep apnea, unspecified: Secondary | ICD-10-CM | POA: Diagnosis present

## 2016-04-01 DIAGNOSIS — G8929 Other chronic pain: Secondary | ICD-10-CM

## 2016-04-01 DIAGNOSIS — N183 Chronic kidney disease, stage 3 (moderate): Secondary | ICD-10-CM | POA: Diagnosis present

## 2016-04-01 DIAGNOSIS — M545 Low back pain, unspecified: Secondary | ICD-10-CM

## 2016-04-01 DIAGNOSIS — R509 Fever, unspecified: Secondary | ICD-10-CM | POA: Diagnosis not present

## 2016-04-01 LAB — CBC WITH DIFFERENTIAL/PLATELET
BASOS ABS: 0 10*3/uL (ref 0.0–0.1)
BASOS PCT: 0 %
Basophils Absolute: 0 10*3/uL (ref 0.0–0.1)
Basophils Relative: 0 %
Eosinophils Absolute: 0.3 10*3/uL (ref 0.0–0.7)
Eosinophils Absolute: 0.2 10*3/uL (ref 0.0–0.7)
Eosinophils Relative: 3 %
Eosinophils Relative: 2 %
HEMATOCRIT: 50.8 % (ref 39.0–52.0)
HEMATOCRIT: 49.3 % (ref 39.0–52.0)
HEMOGLOBIN: 16.5 g/dL (ref 13.0–17.0)
Hemoglobin: 16.1 g/dL (ref 13.0–17.0)
LYMPHS PCT: 9 %
Lymphocytes Relative: 7 %
Lymphs Abs: 0.8 10*3/uL (ref 0.7–4.0)
Lymphs Abs: 0.8 10*3/uL (ref 0.7–4.0)
MCH: 32.4 pg (ref 26.0–34.0)
MCH: 32.5 pg (ref 26.0–34.0)
MCHC: 32.5 g/dL (ref 30.0–36.0)
MCHC: 32.7 g/dL (ref 30.0–36.0)
MCV: 99.6 fL (ref 78.0–100.0)
MCV: 99.4 fL (ref 78.0–100.0)
MONO ABS: 0.5 10*3/uL (ref 0.1–1.0)
MONO ABS: 0.5 10*3/uL (ref 0.1–1.0)
Monocytes Relative: 6 %
Monocytes Relative: 5 %
NEUTROS ABS: 9.1 10*3/uL — AB (ref 1.7–7.7)
NEUTROS PCT: 86 %
Neutro Abs: 7.9 10*3/uL — ABNORMAL HIGH (ref 1.7–7.7)
Neutrophils Relative %: 82 %
PLATELETS: 137 10*3/uL — AB (ref 150–400)
Platelets: 163 10*3/uL (ref 150–400)
RBC: 5.1 MIL/uL (ref 4.22–5.81)
RBC: 4.96 MIL/uL (ref 4.22–5.81)
RDW: 14.7 % (ref 11.5–15.5)
RDW: 14.6 % (ref 11.5–15.5)
WBC: 9.5 10*3/uL (ref 4.0–10.5)
WBC: 10.5 10*3/uL (ref 4.0–10.5)

## 2016-04-01 LAB — COMPREHENSIVE METABOLIC PANEL
ALBUMIN: 3.2 g/dL — AB (ref 3.5–5.0)
ALK PHOS: 95 U/L (ref 38–126)
ALK PHOS: 74 U/L (ref 38–126)
ALT: 29 U/L (ref 17–63)
ALT: 21 U/L (ref 17–63)
ANION GAP: 9 (ref 5–15)
ANION GAP: 10 (ref 5–15)
AST: 33 U/L (ref 15–41)
AST: 25 U/L (ref 15–41)
Albumin: 4 g/dL (ref 3.5–5.0)
BILIRUBIN TOTAL: 1.4 mg/dL — AB (ref 0.3–1.2)
BUN: 34 mg/dL — ABNORMAL HIGH (ref 6–20)
BUN: 35 mg/dL — ABNORMAL HIGH (ref 6–20)
CALCIUM: 9.5 mg/dL (ref 8.9–10.3)
CALCIUM: 8.4 mg/dL — AB (ref 8.9–10.3)
CO2: 30 mmol/L (ref 22–32)
CO2: 27 mmol/L (ref 22–32)
Chloride: 102 mmol/L (ref 101–111)
Chloride: 103 mmol/L (ref 101–111)
Creatinine, Ser: 2.04 mg/dL — ABNORMAL HIGH (ref 0.61–1.24)
Creatinine, Ser: 2.09 mg/dL — ABNORMAL HIGH (ref 0.61–1.24)
GFR calc non Af Amer: 33 mL/min — ABNORMAL LOW (ref >60)
GFR, EST AFRICAN AMERICAN: 39 mL/min — AB (ref >60)
GFR, EST AFRICAN AMERICAN: 38 mL/min — AB (ref >60)
GFR, EST NON AFRICAN AMERICAN: 34 mL/min — AB (ref >60)
GLUCOSE: 88 mg/dL (ref 65–99)
Glucose, Bld: 106 mg/dL — ABNORMAL HIGH (ref 65–99)
Potassium: 3.9 mmol/L (ref 3.5–5.1)
Potassium: 2.8 mmol/L — ABNORMAL LOW (ref 3.5–5.1)
SODIUM: 140 mmol/L (ref 135–145)
Sodium: 141 mmol/L (ref 135–145)
TOTAL PROTEIN: 7.8 g/dL (ref 6.5–8.1)
TOTAL PROTEIN: 5.6 g/dL — AB (ref 6.5–8.1)
Total Bilirubin: 0.9 mg/dL (ref 0.3–1.2)

## 2016-04-01 LAB — I-STAT TROPONIN, ED: TROPONIN I, POC: 0.01 ng/mL (ref 0.00–0.08)

## 2016-04-01 LAB — PROTIME-INR
INR: 1.05
Prothrombin Time: 13.8 seconds (ref 11.4–15.2)

## 2016-04-01 LAB — PROCALCITONIN: PROCALCITONIN: 0.77 ng/mL

## 2016-04-01 LAB — I-STAT CG4 LACTIC ACID, ED: LACTIC ACID, VENOUS: 3.6 mmol/L — AB (ref 0.5–1.9)

## 2016-04-01 LAB — APTT: aPTT: 30 seconds (ref 24–36)

## 2016-04-01 LAB — LACTIC ACID, PLASMA
LACTIC ACID, VENOUS: 2.3 mmol/L — AB (ref 0.5–1.9)
LACTIC ACID, VENOUS: 1.5 mmol/L (ref 0.5–1.9)

## 2016-04-01 LAB — TSH: TSH: 1.043 u[IU]/mL (ref 0.350–4.500)

## 2016-04-01 MED ORDER — ATORVASTATIN CALCIUM 20 MG PO TABS
20.0000 mg | ORAL_TABLET | Freq: Every day | ORAL | Status: DC
Start: 2016-04-01 — End: 2016-04-04
  Administered 2016-04-01 – 2016-04-04 (×4): 20 mg via ORAL
  Filled 2016-04-01 (×4): qty 1

## 2016-04-01 MED ORDER — ACETAMINOPHEN 325 MG PO TABS
650.0000 mg | ORAL_TABLET | Freq: Once | ORAL | Status: AC | PRN
  Administered 2016-04-01: 650 mg via ORAL

## 2016-04-01 MED ORDER — VANCOMYCIN HCL IN DEXTROSE 1-5 GM/200ML-% IV SOLN
1000.0000 mg | Freq: Once | INTRAVENOUS | Status: AC
  Administered 2016-04-01: 1000 mg via INTRAVENOUS
  Filled 2016-04-01: qty 200

## 2016-04-01 MED ORDER — PIPERACILLIN-TAZOBACTAM 3.375 G IVPB 30 MIN
3.3750 g | Freq: Once | INTRAVENOUS | Status: AC
  Administered 2016-04-01: 3.375 g via INTRAVENOUS
  Filled 2016-04-01: qty 50

## 2016-04-01 MED ORDER — ACETAMINOPHEN 325 MG PO TABS: 650.0000 mg | ORAL_TABLET | Freq: Four times a day (QID) | ORAL | Status: DC | PRN

## 2016-04-01 MED ORDER — VANCOMYCIN HCL 10 G IV SOLR: 1500.0000 mg | INTRAVENOUS | Status: DC

## 2016-04-01 MED ORDER — ALLOPURINOL 300 MG PO TABS
300.0000 mg | ORAL_TABLET | Freq: Every day | ORAL | Status: DC
  Administered 2016-04-01 – 2016-04-04 (×4): 300 mg via ORAL
  Filled 2016-04-01 (×4): qty 1

## 2016-04-01 MED ORDER — ACETAMINOPHEN 325 MG PO TABS
ORAL_TABLET | ORAL | Status: AC
  Filled 2016-04-01: qty 2

## 2016-04-01 MED ORDER — HEPARIN SODIUM (PORCINE) 5000 UNIT/ML IJ SOLN
5000.0000 [IU] | Freq: Three times a day (TID) | INTRAMUSCULAR | Status: DC
  Administered 2016-04-01 – 2016-04-04 (×8): 5000 [IU] via SUBCUTANEOUS
  Filled 2016-04-01 (×7): qty 1

## 2016-04-01 MED ORDER — PIPERACILLIN-TAZOBACTAM 3.375 G IVPB
3.3750 g | Freq: Three times a day (TID) | INTRAVENOUS | Status: DC
  Filled 2016-04-01 (×2): qty 50

## 2016-04-01 MED ORDER — SODIUM CHLORIDE 0.9 % IV BOLUS (SEPSIS)
1000.0000 mL | Freq: Once | INTRAVENOUS | Status: AC
  Administered 2016-04-02: 1000 mL via INTRAVENOUS

## 2016-04-01 MED ORDER — ASPIRIN EC 81 MG PO TBEC
81.0000 mg | DELAYED_RELEASE_TABLET | Freq: Every day | ORAL | Status: DC
  Administered 2016-04-01 – 2016-04-04 (×4): 81 mg via ORAL
  Filled 2016-04-01 (×4): qty 1

## 2016-04-01 MED ORDER — TETANUS-DIPHTH-ACELL PERTUSSIS 5-2.5-18.5 LF-MCG/0.5 IM SUSP
0.5000 mL | Freq: Once | INTRAMUSCULAR | Status: AC
  Administered 2016-04-01: 0.5 mL via INTRAMUSCULAR
  Filled 2016-04-01: qty 0.5

## 2016-04-01 MED ORDER — CLONAZEPAM 1 MG PO TABS
1.0000 mg | ORAL_TABLET | Freq: Three times a day (TID) | ORAL | Status: DC | PRN
  Administered 2016-04-02 – 2016-04-03 (×2): 2 mg via ORAL
  Filled 2016-04-01: qty 1
  Filled 2016-04-01: qty 2
  Filled 2016-04-01: qty 1
  Filled 2016-04-01: qty 2

## 2016-04-01 MED ORDER — CLONAZEPAM 1 MG PO TABS: 1.0000 mg | ORAL_TABLET | Freq: Three times a day (TID) | ORAL | Status: DC | PRN

## 2016-04-01 MED ORDER — SODIUM CHLORIDE 0.9 % IV BOLUS (SEPSIS): 500.0000 mL | Freq: Once | INTRAVENOUS | Status: DC

## 2016-04-01 MED ORDER — ACETAMINOPHEN 325 MG PO TABS
325.0000 mg | ORAL_TABLET | Freq: Once | ORAL | Status: AC
  Administered 2016-04-01: 325 mg via ORAL
  Filled 2016-04-01: qty 1

## 2016-04-01 MED ORDER — CARVEDILOL 6.25 MG PO TABS
6.2500 mg | ORAL_TABLET | Freq: Two times a day (BID) | ORAL | Status: DC
  Filled 2016-04-01 (×2): qty 1

## 2016-04-01 MED ORDER — SODIUM CHLORIDE 0.9 % IV SOLN
INTRAVENOUS | Status: DC
  Administered 2016-04-02 – 2016-04-03 (×4): via INTRAVENOUS

## 2016-04-01 MED ORDER — HYDROCODONE-ACETAMINOPHEN 10-325 MG PO TABS
1.0000 | ORAL_TABLET | Freq: Four times a day (QID) | ORAL | Status: DC | PRN
Start: 2016-04-01 — End: 2016-04-04
  Administered 2016-04-02 – 2016-04-04 (×4): 1 via ORAL
  Filled 2016-04-01 (×4): qty 1

## 2016-04-01 MED ORDER — ACETAMINOPHEN 650 MG RE SUPP: 650.0000 mg | Freq: Four times a day (QID) | RECTAL | Status: DC | PRN

## 2016-04-01 MED ORDER — ONDANSETRON HCL 4 MG/2ML IJ SOLN: 4.0000 mg | Freq: Four times a day (QID) | INTRAMUSCULAR | Status: DC | PRN

## 2016-04-01 MED ORDER — SODIUM CHLORIDE 0.9 % IV BOLUS (SEPSIS)
3000.0000 mL | Freq: Once | INTRAVENOUS | Status: AC
Start: 2016-04-01 — End: 2016-04-02
  Administered 2016-04-01 (×3): 1000 mL via INTRAVENOUS

## 2016-04-01 MED ORDER — ONDANSETRON HCL 4 MG PO TABS
4.0000 mg | ORAL_TABLET | Freq: Four times a day (QID) | ORAL | Status: DC | PRN
  Administered 2016-04-01 – 2016-04-03 (×5): 4 mg via ORAL
  Filled 2016-04-01 (×5): qty 1
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
## 2016-04-01 NOTE — Progress Notes (Signed)
Lab called about lactic acid 2.3 at 2138. MD notified at 2141.

## 2016-04-01 NOTE — ED Triage Notes (Signed)
Sharp chest pain started while trying to get dressed to come to ED for concern for infection in his left lower leg/left foot. Shivering in triage, temp 102.
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
## 2016-04-01 NOTE — ED Notes (Signed)
Attempted report 

## 2016-04-01 NOTE — ED Notes (Signed)
Pt st's he noticed his left lower leg getting inflamed a couple of days ago.  St's yesterday he started having fever and chills.  At this time left lower leg is red and warm to touch

## 2016-04-01 NOTE — Progress Notes (Signed)
Pt temperature 101.3 reported by nurse tech at [place]. Temp retaken at 2015, temp 102.4. Pt had recent dose of Tylenol at 1833. MD paged at 2020.

## 2016-04-01 NOTE — ED Notes (Signed)
Blood cultures drawn.

## 2016-04-01 NOTE — Progress Notes (Signed)
Pharmacy Antibiotic Note  Richard Cook is a 60 y.o. male admitted on 04/01/2016 with sepsis and cellulitis.  PMH includes CKD Stage III, HLD, CAD s/p MI, PCI (2013) and CABG (2014), gout, CHF. Pharmacy has been consulted for vancomycin and zosyn dosing.  SCr ~2.04, pt stated weight 85 kg, CrCl 48. WBC 9.5, lactic acid 3.6, Temp 102, RR 22, HR 109, BP 140/89.  Vancomycin 1000 mg IV x1 given in ED at 1811, zosyn 3.375 IV 30 min infusion x1 given in ED at [place]: Vanc 1500 mg IV q24 starting 18h after first dose (8/31 @ 1200) Zosyn 3.375 EI infusion q8h starting 6h after first dose (8/31 at 0000) Monitor clinical picture, Tmax, CBC, renal function daily F/u culture data, LOT ABX Vanc trough PRN     Temp (24hrs), Avg:102 F (38.9 C), Min:102 F (38.9 C), Max:102 F (38.9 C)   Recent Labs Lab 04/01/16 1630 04/01/16 1648  WBC 9.5  --   CREATININE 2.04*  --   LATICACIDVEN  --  3.60*      Allergies  Allergen Reactions  . Codeine Nausea Only    Tolerates hydrocodone    Antimicrobials this admission: Vanc 8/30>> Zosyn 8/30>>  Dose adjustments this admission: none  Microbiology results: 8/30 BCx: pending  Thank you for allowing pharmacy to be a part of this patient's care.  Carlean Jews, Pharm.D. PGY1 Pharmacy Resident 8/30/20176:23 PM Pager [phone]

## 2016-04-01 NOTE — Progress Notes (Signed)
VS at 2300 T 99.2, BP 76/48, HR 77, RR 18 and SpO2 97 on RA. MD paged at 2302. RR nurse called at 2308, RN instructed by RR nurse to wait for MD to respond. Pt alert and oriented, asymptomatic, lung sounds clear, denied chest pain and discomfort. Pt is receiving 3rd bolus of NS.  Pt requested pain medication for chronic bilateral knee pain and Klonopin. RN concerned about BP readings, MD paged and parameters included in order. Due to SBP < 90, Klonopin and Norco not given, patient asleep at 0030. Bolus completed at [place]. MD notified at [place] about bolus being completed and VS Temp 98.4, BP 93/54, HR 72, SpO2 97 on RA and RR 18. Pt A&Ox4, denies any discomfort at this time.  RR nurse assessed patient.

## 2016-04-02 ENCOUNTER — Inpatient Hospital Stay (HOSPITAL_COMMUNITY): Payer: Medicaid Other

## 2016-04-02 DIAGNOSIS — R6520 Severe sepsis without septic shock: Secondary | ICD-10-CM

## 2016-04-02 DIAGNOSIS — L03116 Cellulitis of left lower limb: Secondary | ICD-10-CM

## 2016-04-02 DIAGNOSIS — N183 Chronic kidney disease, stage 3 (moderate): Secondary | ICD-10-CM

## 2016-04-02 DIAGNOSIS — A419 Sepsis, unspecified organism: Principal | ICD-10-CM

## 2016-04-02 LAB — CBC
HEMATOCRIT: 38.6 % — AB (ref 39.0–52.0)
HEMOGLOBIN: 12.5 g/dL — AB (ref 13.0–17.0)
MCH: 32 pg (ref 26.0–34.0)
MCHC: 32.4 g/dL (ref 30.0–36.0)
MCV: 98.7 fL (ref 78.0–100.0)
Platelets: 131 10*3/uL — ABNORMAL LOW (ref 150–400)
RBC: 3.91 MIL/uL — AB (ref 4.22–5.81)
RDW: 14.8 % (ref 11.5–15.5)
WBC: 10 10*3/uL (ref 4.0–10.5)

## 2016-04-02 LAB — BASIC METABOLIC PANEL
ANION GAP: 6 (ref 5–15)
BUN: 29 mg/dL — ABNORMAL HIGH (ref 6–20)
CALCIUM: 7.4 mg/dL — AB (ref 8.9–10.3)
CO2: 23 mmol/L (ref 22–32)
Chloride: 112 mmol/L — ABNORMAL HIGH (ref 101–111)
Creatinine, Ser: 1.86 mg/dL — ABNORMAL HIGH (ref 0.61–1.24)
GFR calc non Af Amer: 38 mL/min — ABNORMAL LOW (ref >60)
GFR, EST AFRICAN AMERICAN: 44 mL/min — AB (ref >60)
GLUCOSE: 91 mg/dL (ref 65–99)
POTASSIUM: 3.3 mmol/L — AB (ref 3.5–5.1)
Sodium: 141 mmol/L (ref 135–145)

## 2016-04-02 LAB — LACTIC ACID, PLASMA
LACTIC ACID, VENOUS: 1.1 mmol/L (ref 0.5–1.9)
Lactic Acid, Venous: 1 mmol/L (ref 0.5–1.9)

## 2016-04-02 LAB — C DIFFICILE QUICK SCREEN W PCR REFLEX
C Diff antigen: NEGATIVE
C Diff interpretation: NOT DETECTED
C Diff toxin: NEGATIVE

## 2016-04-02 LAB — PROCALCITONIN: PROCALCITONIN: 1.97 ng/mL

## 2016-04-02 MED ORDER — LOPERAMIDE HCL 2 MG PO CAPS
2.0000 mg | ORAL_CAPSULE | ORAL | Status: DC | PRN
  Administered 2016-04-02 – 2016-04-03 (×2): 4 mg via ORAL
  Administered 2016-04-03 – 2016-04-04 (×2): 2 mg via ORAL
  Filled 2016-04-02: qty 1
  Filled 2016-04-02 (×3): qty 2

## 2016-04-02 MED ORDER — POTASSIUM CHLORIDE CRYS ER 20 MEQ PO TBCR
40.0000 meq | EXTENDED_RELEASE_TABLET | Freq: Once | ORAL | Status: AC
  Administered 2016-04-02: 40 meq via ORAL
  Filled 2016-04-02: qty 2

## 2016-04-02 MED ORDER — VANCOMYCIN HCL IN DEXTROSE 1-5 GM/200ML-% IV SOLN: 1000.0000 mg | Freq: Once | INTRAVENOUS | Status: DC

## 2016-04-02 MED ORDER — PIPERACILLIN-TAZOBACTAM 3.375 G IVPB 30 MIN: 3.3750 g | Freq: Once | INTRAVENOUS | Status: DC

## 2016-04-02 MED ORDER — SODIUM CHLORIDE 0.9 % IV BOLUS (SEPSIS)
500.0000 mL | Freq: Once | INTRAVENOUS | Status: AC
  Administered 2016-04-02: 500 mL via INTRAVENOUS

## 2016-04-02 MED ORDER — PIPERACILLIN-TAZOBACTAM 3.375 G IVPB
3.3750 g | Freq: Three times a day (TID) | INTRAVENOUS | Status: DC
  Administered 2016-04-02 – 2016-04-04 (×7): 3.375 g via INTRAVENOUS
  Filled 2016-04-02 (×10): qty 50

## 2016-04-02 MED ORDER — VANCOMYCIN HCL 10 G IV SOLR
1500.0000 mg | INTRAVENOUS | Status: DC
  Administered 2016-04-02 – 2016-04-04 (×3): 1500 mg via INTRAVENOUS
  Filled 2016-04-02 (×3): qty 1500

## 2016-04-02 MED ORDER — POTASSIUM CHLORIDE CRYS ER 10 MEQ PO TBCR
30.0000 meq | EXTENDED_RELEASE_TABLET | ORAL | Status: AC
  Administered 2016-04-02 (×2): 30 meq via ORAL
  Filled 2016-04-02 (×2): qty 1
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
## 2016-04-02 NOTE — Care Management Note (Signed)
Case Management Note  Patient Details  Name: Richard Cook MRN: [id number] Date of Birth: November 07, 1955  Subjective/Objective:                 Independent patient from home who states that he just finished a five day hike on the [place]. Admitted with cellulitis to leg and sepsis which he states he has had several times in the past. PCP Lelon Huh, Uses [facility] on [address] or [facility] on [facility]. Patient states he has crutches available if he needs them. He denies having difficulties paying for meds even though he is uninsured.    Action/Plan:  CM will continue to follow.   Expected Discharge Date:                  Expected Discharge Plan:  Home/Self Care  In-House Referral:  NA  Discharge planning Services  CM Consult  Post Acute Care Choice:  NA Choice offered to:  NA  DME Arranged:  N/A DME Agency:  NA  HH Arranged:  NA HH Agency:  NA  Status of Service:  In process, will continue to follow  If discussed at Long Length of Stay Meetings, dates discussed:    Additional Comments:  Carles Collet, RN 04/02/2016, 11:18 AM

## 2016-04-02 NOTE — Progress Notes (Signed)
Pt potassium 2.8. MD notified at 0300.

## 2016-04-02 NOTE — Progress Notes (Signed)
Pt does not have antibiotics scheduled, MD notified.

## 2016-04-02 NOTE — Progress Notes (Signed)
Pt admitted earlier with sepsis. Shift event: At beginning of shift, pt's temp over 102 after tylenol. Extra tylenol and ice to axilla ordered and temp down. BP also on low side but has come up to the 90s after > 4L boluses and 100cc/hr maintenance fluids. LA trended back down to normal. HR stable. No respiratory issues. Pt has made urine during shift. K repleted.  Taking manual BPs q2 this shift. Pt has remained alert and oriented. Labs ordered for this am. Sign out left to attending for am.  Will follow.  KJKG, NP [facility]

## 2016-04-02 NOTE — Progress Notes (Signed)
Pt refused to remove hernia brace, RN unable to assess skin of buttocks, groin, and male genitalia. Pt confirmed presence of suprapubic hernia on right side.

## 2016-04-02 NOTE — Progress Notes (Signed)
Pt placed on RRT Sepsis list earlier on rounds. Call received per floor RN at 2305 regarding Pt with new hypotension. Pt asymptomatic. [facility] NP Tylene Fantasia paged RN awaiting orders.  Advised RN to await orders. Orders placed shortly after  for NS 500 ML. Pt seen at 0010 resting in bed, awake oriented x 4, denies pain, sob or dizziness. Lungs clear to auscultation. Left leg with some redness but unchanged from initial assessment. RN advised to monitor pt closely and update Provider on status after bolus. RRT will continue to follow tonight.

## 2016-04-03 DIAGNOSIS — L03115 Cellulitis of right lower limb: Secondary | ICD-10-CM

## 2016-04-03 LAB — BASIC METABOLIC PANEL
ANION GAP: 6 (ref 5–15)
BUN: 16 mg/dL (ref 6–20)
CHLORIDE: 115 mmol/L — AB (ref 101–111)
CO2: 20 mmol/L — ABNORMAL LOW (ref 22–32)
Calcium: 8.3 mg/dL — ABNORMAL LOW (ref 8.9–10.3)
Creatinine, Ser: 1.71 mg/dL — ABNORMAL HIGH (ref 0.61–1.24)
GFR calc Af Amer: 49 mL/min — ABNORMAL LOW (ref >60)
GFR, EST NON AFRICAN AMERICAN: 42 mL/min — AB (ref >60)
GLUCOSE: 71 mg/dL (ref 65–99)
POTASSIUM: 4.1 mmol/L (ref 3.5–5.1)
Sodium: 141 mmol/L (ref 135–145)

## 2016-04-03 LAB — HEMOGLOBIN A1C
HEMOGLOBIN A1C: 5.6 % (ref 4.8–5.6)
Mean Plasma Glucose: 114 mg/dL

## 2016-04-03 LAB — CBC
HEMATOCRIT: 40.4 % (ref 39.0–52.0)
HEMOGLOBIN: 12.7 g/dL — AB (ref 13.0–17.0)
MCH: 31.7 pg (ref 26.0–34.0)
MCHC: 31.4 g/dL (ref 30.0–36.0)
MCV: 100.7 fL — AB (ref 78.0–100.0)
Platelets: 112 10*3/uL — ABNORMAL LOW (ref 150–400)
RBC: 4.01 MIL/uL — ABNORMAL LOW (ref 4.22–5.81)
RDW: 14.9 % (ref 11.5–15.5)
WBC: 6.9 10*3/uL (ref 4.0–10.5)

## 2016-04-03 NOTE — Progress Notes (Signed)
Pt had new onset of first degree heart block. Notified Kirby,NP. Kirby,NP placed order for continuous cardiac monitoring and EKG. Patient refused EKG, Kirby,NP notified. Will continue to monitor and treat per MD orders.
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
## 2016-04-04 DIAGNOSIS — L03032 Cellulitis of left toe: Secondary | ICD-10-CM

## 2016-04-04 LAB — BASIC METABOLIC PANEL
Anion gap: 5 (ref 5–15)
BUN: 11 mg/dL (ref 6–20)
CALCIUM: 8.8 mg/dL — AB (ref 8.9–10.3)
CO2: 24 mmol/L (ref 22–32)
CREATININE: 1.58 mg/dL — AB (ref 0.61–1.24)
Chloride: 113 mmol/L — ABNORMAL HIGH (ref 101–111)
GFR calc Af Amer: 54 mL/min — ABNORMAL LOW (ref >60)
GFR, EST NON AFRICAN AMERICAN: 46 mL/min — AB (ref >60)
GLUCOSE: 84 mg/dL (ref 65–99)
Potassium: 4 mmol/L (ref 3.5–5.1)
Sodium: 142 mmol/L (ref 135–145)

## 2016-04-04 LAB — CBC
HCT: 37.2 % — ABNORMAL LOW (ref 39.0–52.0)
Hemoglobin: 11.8 g/dL — ABNORMAL LOW (ref 13.0–17.0)
MCH: 31.5 pg (ref 26.0–34.0)
MCHC: 31.7 g/dL (ref 30.0–36.0)
MCV: 99.2 fL (ref 78.0–100.0)
Platelets: 117 10*3/uL — ABNORMAL LOW (ref 150–400)
RBC: 3.75 MIL/uL — ABNORMAL LOW (ref 4.22–5.81)
RDW: 14.8 % (ref 11.5–15.5)
WBC: 6.3 10*3/uL (ref 4.0–10.5)

## 2016-04-04 MED ORDER — DOXYCYCLINE HYCLATE 100 MG PO CAPS: 100.0000 mg | ORAL_CAPSULE | Freq: Two times a day (BID) | ORAL | 0 refills | Status: DC

## 2016-04-04 MED ORDER — LOPERAMIDE HCL 2 MG PO CAPS: 2.0000 mg | ORAL_CAPSULE | ORAL | 0 refills | Status: DC | PRN
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
## 2016-04-04 NOTE — Progress Notes (Signed)
Pharmacy Antibiotic Note  Richard Cook is a 60 y.o. male admitted on 04/01/2016 with sepsis and cellulitis.  PMH includes CKD Stage III, HLD, CAD s/p MI, PCI (2013) and CABG (2014), gout, CHF. Pharmacy has been consulted for vancomycin and Zosyn dosing.  Renal function improving  - dose remains appropriate to target vancomycin trough 10-15 mcg/ml.  Plan: Vancomycin 1500 mg IV q24h Zosyn 3.375 EI infusion q8h starting Monitor clinical picture, CBC, renal function daily F/u culture data, LOT ABX Vanc trough PRN  Height: 5\' 10"  (177.8 cm) Weight: 187 lb 6.3 oz (85 kg) IBW/kg (Calculated) : 73  Temp (24hrs), Avg:98.2 F (36.8 C), Min:98.2 F (36.8 C), Max:98.2 F (36.8 C)   Recent Labs Lab 04/01/16 1630 04/01/16 1648 04/01/16 1916 04/01/16 2017 04/01/16 2238 04/02/16 0302 04/02/16 0842 04/02/16 1134 04/03/16 0615 04/04/16 0445  WBC 9.5  --  10.5  --   --  10.0  --   --  6.9 6.3  CREATININE 2.04*  --   --  2.09*  --  1.86*  --   --  1.71* 1.58*  LATICACIDVEN  --  3.60*  --  2.3* 1.5  --  1.0 1.1  --   --       Allergies  Allergen Reactions  . Eggs Or Egg-Derived Products Hives  . Codeine Nausea Only    Tolerates hydrocodone    Antimicrobials this admission: Vanc 8/30>> Zosyn 8/30>>  Dose adjustments this admission: none  Microbiology results: 8/30 BCx x 2>> ngtd 8/31 C diff neg  Thank you for allowing pharmacy to be a part of this patient's care.  Renold Genta, PharmD, BCPS Clinical Pharmacist Phone for today - [place] - [phone] 04/04/2016 2:34 PM

## 2016-04-06 LAB — CULTURE, BLOOD (ROUTINE X 2)
CULTURE: NO GROWTH
CULTURE: NO GROWTH

## 2016-04-07 LAB — CULTURE, BLOOD (ROUTINE X 2): Culture: NO GROWTH

## 2016-04-21 ENCOUNTER — Ambulatory Visit (INDEPENDENT_AMBULATORY_CARE_PROVIDER_SITE_OTHER): Payer: Self-pay | Admitting: *Deleted

## 2016-04-21 ENCOUNTER — Other Ambulatory Visit: Payer: Self-pay | Admitting: Family Medicine

## 2016-04-21 DIAGNOSIS — I472 Ventricular tachycardia, unspecified: Secondary | ICD-10-CM

## 2016-04-21 DIAGNOSIS — M5489 Other dorsalgia: Secondary | ICD-10-CM

## 2016-04-21 MED ORDER — HYDROCODONE-ACETAMINOPHEN 10-325 MG PO TABS: 1.0000 | ORAL_TABLET | Freq: Four times a day (QID) | ORAL | 0 refills | Status: DC | PRN

## 2016-04-21 NOTE — Telephone Encounter (Signed)
Pt contacted office for refill request on the following medications:  HYDROcodone-acetaminophen (NORCO) 10-325 MG tablet.  ZB#[phone]/[id number]

## 2016-04-21 NOTE — Progress Notes (Signed)
Remote ICD transmission.   

## 2016-04-22 ENCOUNTER — Encounter: Payer: Self-pay | Admitting: Cardiology

## 2016-05-05 ENCOUNTER — Telehealth: Payer: Self-pay | Admitting: Family Medicine

## 2016-05-05 NOTE — Telephone Encounter (Signed)
Pt has questions about the medications he is taking.  RS#[phone]/[id number]

## 2016-05-05 NOTE — Telephone Encounter (Signed)
LMOVM for pt to return call 

## 2016-05-06 NOTE — Telephone Encounter (Signed)
Pt returned your call  Richard Cook

## 2016-05-07 NOTE — Telephone Encounter (Signed)
Returned call to pt. Patient wanted to go over his current med list.

## 2016-05-08 ENCOUNTER — Telehealth: Payer: Self-pay | Admitting: *Deleted

## 2016-05-08 ENCOUNTER — Telehealth: Payer: Self-pay | Admitting: Internal Medicine

## 2016-05-08 NOTE — Telephone Encounter (Signed)
Spoke with patient who stated that on 10/5 he started to feel like he was having palpitations. He stated that he laid down on the bed with CP and ShOB and then felt his device shock him. Post shock he stated that he felt better and currently has no symptoms. He is unsure if he has been taking his medication as prescribed since some changes were made from a recent OV. Reviewed episodes with GT from carelink transmission. Instructed patient to take medication as prescribed and we will schedule an appt with SK. I informed patient of DMV driving restrictions for the next six months. I will fax patient his current medication list so he can verify correct medications and dosages.

## 2016-05-08 NOTE — Telephone Encounter (Signed)
New message   Pt verbalized that his pointer finger on his is numb and the other ones are white

## 2016-05-08 NOTE — Telephone Encounter (Signed)
New message     1. Has your device fired? yes  2. Is you device beeping? no  3. Are you experiencing draining or swelling at device site? no  4. Are you calling to see if we received your device transmission? no 5. Have you passed out? his device shocked him 05/07/16

## 2016-05-08 NOTE — Telephone Encounter (Signed)
I spoke with the patient.  He reports that he developed numbness to his finger yesterday that lasted about 30-45 minutes then resolved. He states he was laying down last night and his device fired- he reports hearing a pop and seeing a bright light and throwing the remote control across the room. Richard Cook, device RN spoke with him earlier today regarding his shock. Per Richard Cook, this was reviewed by Dr. Lovena Le and plan was to follow up with Dr. [name] next week- appt scheduled for 10/12. He states that his fingers have gone numb again for the past hour and he is concerned his device is going to fire again. I advised his I am uncertain that that is related to his heart, but I cannot confirm that. I advised him to be evaluated in the ER for his symptoms- he does not want to do this. He said next week was not good for him to come because his off day is 10/12 and he to go to court. I advised him I could move his appt to 10/10 at 8:15 am- he initially did not want to do this, but did decide to take the appt. He will check with his work to make sure he can get off. I have advised him if symptoms of numbness persist, or device fires again, then he should report to the ER.  He states "maybe."

## 2016-05-11 LAB — CUP PACEART REMOTE DEVICE CHECK
Date Time Interrogation Session: 20170919071704
HighPow Impedance: 80 Ohm
Implantable Lead Implant Date: 20150318
Implantable Lead Location: 753860
Implantable Lead Model: 181
Lead Channel Pacing Threshold Amplitude: 0.75 V
Lead Channel Pacing Threshold Pulse Width: 0.4 ms
Lead Channel Sensing Intrinsic Amplitude: 8.75 mV
Lead Channel Sensing Intrinsic Amplitude: 8.75 mV
Lead Channel Setting Pacing Amplitude: 2 V
MDC IDC LEAD SERIAL: 327195
MDC IDC MSMT BATTERY REMAINING LONGEVITY: 117 mo
MDC IDC MSMT BATTERY VOLTAGE: 3.01 V
MDC IDC MSMT LEADCHNL RV IMPEDANCE VALUE: 589 Ohm
MDC IDC MSMT LEADCHNL RV IMPEDANCE VALUE: 589 Ohm
MDC IDC SET LEADCHNL RV PACING PULSEWIDTH: 0.4 ms
MDC IDC SET LEADCHNL RV SENSING SENSITIVITY: 0.3 mV
MDC IDC STAT BRADY RV PERCENT PACED: 0.16 %

## 2016-05-12 ENCOUNTER — Inpatient Hospital Stay (HOSPITAL_COMMUNITY): Payer: Medicaid Other

## 2016-05-12 ENCOUNTER — Ambulatory Visit (INDEPENDENT_AMBULATORY_CARE_PROVIDER_SITE_OTHER): Payer: Self-pay | Admitting: Internal Medicine

## 2016-05-12 ENCOUNTER — Encounter (INDEPENDENT_AMBULATORY_CARE_PROVIDER_SITE_OTHER): Payer: Self-pay

## 2016-05-12 ENCOUNTER — Inpatient Hospital Stay (HOSPITAL_COMMUNITY)
Admission: AD | Admit: 2016-05-12 | Discharge: 2016-05-16 | DRG: 682 | Disposition: A | Discharge status: Home / Self Care | Payer: Medicaid Other | Source: Ambulatory Visit | Attending: Internal Medicine | Admitting: Internal Medicine

## 2016-05-12 ENCOUNTER — Encounter: Payer: Self-pay | Admitting: Internal Medicine

## 2016-05-12 ENCOUNTER — Telehealth: Payer: Self-pay | Admitting: Internal Medicine

## 2016-05-12 VITALS — BP 90/62 | HR 77 | Temp 97.4°F | Ht 70.0 in | Wt 193.2 lb

## 2016-05-12 DIAGNOSIS — M109 Gout, unspecified: Secondary | ICD-10-CM | POA: Diagnosis present

## 2016-05-12 DIAGNOSIS — I472 Ventricular tachycardia, unspecified: Secondary | ICD-10-CM

## 2016-05-12 DIAGNOSIS — Z8249 Family history of ischemic heart disease and other diseases of the circulatory system: Secondary | ICD-10-CM

## 2016-05-12 DIAGNOSIS — I252 Old myocardial infarction: Secondary | ICD-10-CM | POA: Diagnosis not present

## 2016-05-12 DIAGNOSIS — I471 Supraventricular tachycardia, unspecified: Secondary | ICD-10-CM | POA: Diagnosis present

## 2016-05-12 DIAGNOSIS — E7800 Pure hypercholesterolemia, unspecified: Secondary | ICD-10-CM | POA: Diagnosis present

## 2016-05-12 DIAGNOSIS — N170 Acute kidney failure with tubular necrosis: Secondary | ICD-10-CM | POA: Diagnosis present

## 2016-05-12 DIAGNOSIS — Z951 Presence of aortocoronary bypass graft: Secondary | ICD-10-CM

## 2016-05-12 DIAGNOSIS — M545 Low back pain, unspecified: Secondary | ICD-10-CM | POA: Diagnosis present

## 2016-05-12 DIAGNOSIS — N185 Chronic kidney disease, stage 5: Secondary | ICD-10-CM | POA: Diagnosis present

## 2016-05-12 DIAGNOSIS — I255 Ischemic cardiomyopathy: Secondary | ICD-10-CM

## 2016-05-12 DIAGNOSIS — N400 Enlarged prostate without lower urinary tract symptoms: Secondary | ICD-10-CM | POA: Diagnosis present

## 2016-05-12 DIAGNOSIS — Z91012 Allergy to eggs: Secondary | ICD-10-CM

## 2016-05-12 DIAGNOSIS — M1030 Gout due to renal impairment, unspecified site: Secondary | ICD-10-CM

## 2016-05-12 DIAGNOSIS — N281 Cyst of kidney, acquired: Secondary | ICD-10-CM | POA: Diagnosis present

## 2016-05-12 DIAGNOSIS — Z86718 Personal history of other venous thrombosis and embolism: Secondary | ICD-10-CM | POA: Diagnosis not present

## 2016-05-12 DIAGNOSIS — Z95810 Presence of automatic (implantable) cardiac defibrillator: Secondary | ICD-10-CM

## 2016-05-12 DIAGNOSIS — M5489 Other dorsalgia: Secondary | ICD-10-CM

## 2016-05-12 DIAGNOSIS — R001 Bradycardia, unspecified: Secondary | ICD-10-CM | POA: Diagnosis present

## 2016-05-12 DIAGNOSIS — G8929 Other chronic pain: Secondary | ICD-10-CM | POA: Diagnosis present

## 2016-05-12 DIAGNOSIS — N19 Unspecified kidney failure: Secondary | ICD-10-CM

## 2016-05-12 DIAGNOSIS — I5043 Acute on chronic combined systolic (congestive) and diastolic (congestive) heart failure: Secondary | ICD-10-CM

## 2016-05-12 DIAGNOSIS — Z79899 Other long term (current) drug therapy: Secondary | ICD-10-CM

## 2016-05-12 DIAGNOSIS — Z885 Allergy status to narcotic agent status: Secondary | ICD-10-CM

## 2016-05-12 DIAGNOSIS — I5023 Acute on chronic systolic (congestive) heart failure: Secondary | ICD-10-CM

## 2016-05-12 DIAGNOSIS — I130 Hypertensive heart and chronic kidney disease with heart failure and stage 1 through stage 4 chronic kidney disease, or unspecified chronic kidney disease: Secondary | ICD-10-CM | POA: Diagnosis present

## 2016-05-12 DIAGNOSIS — N183 Chronic kidney disease, stage 3 (moderate): Secondary | ICD-10-CM | POA: Diagnosis present

## 2016-05-12 DIAGNOSIS — Z955 Presence of coronary angioplasty implant and graft: Secondary | ICD-10-CM

## 2016-05-12 DIAGNOSIS — N289 Disorder of kidney and ureter, unspecified: Secondary | ICD-10-CM

## 2016-05-12 DIAGNOSIS — F419 Anxiety disorder, unspecified: Secondary | ICD-10-CM | POA: Diagnosis present

## 2016-05-12 DIAGNOSIS — M069 Rheumatoid arthritis, unspecified: Secondary | ICD-10-CM | POA: Diagnosis present

## 2016-05-12 DIAGNOSIS — I959 Hypotension, unspecified: Secondary | ICD-10-CM | POA: Diagnosis present

## 2016-05-12 DIAGNOSIS — Z7982 Long term (current) use of aspirin: Secondary | ICD-10-CM | POA: Diagnosis not present

## 2016-05-12 DIAGNOSIS — N184 Chronic kidney disease, stage 4 (severe): Secondary | ICD-10-CM | POA: Diagnosis present

## 2016-05-12 DIAGNOSIS — I2510 Atherosclerotic heart disease of native coronary artery without angina pectoris: Secondary | ICD-10-CM | POA: Diagnosis present

## 2016-05-12 LAB — CBC WITH DIFFERENTIAL/PLATELET
BASOS ABS: 0 10*3/uL (ref 0.0–0.1)
Basophils Relative: 0 %
Eosinophils Absolute: 0.3 10*3/uL (ref 0.0–0.7)
Eosinophils Relative: 4 %
HEMATOCRIT: 38.4 % — AB (ref 39.0–52.0)
HEMOGLOBIN: 12.2 g/dL — AB (ref 13.0–17.0)
LYMPHS PCT: 20 %
Lymphs Abs: 1.2 10*3/uL (ref 0.7–4.0)
MCH: 31.5 pg (ref 26.0–34.0)
MCHC: 31.8 g/dL (ref 30.0–36.0)
MCV: 99.2 fL (ref 78.0–100.0)
MONO ABS: 0.5 10*3/uL (ref 0.1–1.0)
Monocytes Relative: 8 %
NEUTROS ABS: 4.2 10*3/uL (ref 1.7–7.7)
NEUTROS PCT: 68 %
Platelets: 159 10*3/uL (ref 150–400)
RBC: 3.87 MIL/uL — AB (ref 4.22–5.81)
RDW: 14.2 % (ref 11.5–15.5)
WBC: 6.2 10*3/uL (ref 4.0–10.5)

## 2016-05-12 LAB — CUP PACEART INCLINIC DEVICE CHECK
Battery Voltage: 2.98 V
Date Time Interrogation Session: 20171010093735
HIGH POWER IMPEDANCE MEASURED VALUE: 66 Ohm
Implantable Lead Implant Date: 20150318
Implantable Lead Serial Number: 327195
Lead Channel Impedance Value: 589 Ohm
Lead Channel Pacing Threshold Amplitude: 0.5 V
Lead Channel Pacing Threshold Pulse Width: 0.4 ms
Lead Channel Sensing Intrinsic Amplitude: 9.5 mV
MDC IDC LEAD LOCATION: 753860
MDC IDC MSMT BATTERY REMAINING LONGEVITY: 114 mo
MDC IDC MSMT LEADCHNL RV IMPEDANCE VALUE: 589 Ohm
MDC IDC SET LEADCHNL RV PACING AMPLITUDE: 2 V
MDC IDC SET LEADCHNL RV PACING PULSEWIDTH: 0.4 ms
MDC IDC SET LEADCHNL RV SENSING SENSITIVITY: 0.3 mV
MDC IDC STAT BRADY RV PERCENT PACED: 0.14 %

## 2016-05-12 LAB — URINALYSIS, ROUTINE W REFLEX MICROSCOPIC
BILIRUBIN URINE: NEGATIVE
GLUCOSE, UA: NEGATIVE mg/dL
HGB URINE DIPSTICK: NEGATIVE
KETONES UR: NEGATIVE mg/dL
LEUKOCYTES UA: NEGATIVE
Nitrite: NEGATIVE
PROTEIN: NEGATIVE mg/dL
Specific Gravity, Urine: 1.015 (ref 1.005–1.030)
pH: 5.5 (ref 5.0–8.0)

## 2016-05-12 LAB — BASIC METABOLIC PANEL
ANION GAP: 12 (ref 5–15)
BUN: 114 mg/dL — ABNORMAL HIGH (ref 6–20)
CO2: 23 mmol/L (ref 22–32)
Calcium: 9 mg/dL (ref 8.9–10.3)
Chloride: 103 mmol/L (ref 101–111)
Creatinine, Ser: 5.94 mg/dL — ABNORMAL HIGH (ref 0.61–1.24)
GFR calc Af Amer: 11 mL/min — ABNORMAL LOW (ref >60)
GFR, EST NON AFRICAN AMERICAN: 9 mL/min — AB (ref >60)
GLUCOSE: 118 mg/dL — AB (ref 65–99)
POTASSIUM: 4.6 mmol/L (ref 3.5–5.1)
Sodium: 138 mmol/L (ref 135–145)

## 2016-05-12 LAB — BRAIN NATRIURETIC PEPTIDE
B NATRIURETIC PEPTIDE 5: 182.9 pg/mL — AB (ref 0.0–100.0)
B Natriuretic Peptide: 188.5 pg/mL — ABNORMAL HIGH (ref 0.0–100.0)

## 2016-05-12 LAB — MAGNESIUM: MAGNESIUM: 2.1 mg/dL (ref 1.7–2.4)

## 2016-05-12 LAB — TROPONIN I: TROPONIN I: 0.04 ng/mL — AB (ref <0.03)

## 2016-05-12 LAB — PHOSPHORUS: PHOSPHORUS: 8.1 mg/dL — AB (ref 2.5–4.6)

## 2016-05-12 LAB — TSH: TSH: 2.447 u[IU]/mL (ref 0.350–4.500)

## 2016-05-12 MED ORDER — ACETAMINOPHEN 325 MG PO TABS: 650.0000 mg | ORAL_TABLET | Freq: Four times a day (QID) | ORAL | Status: DC | PRN

## 2016-05-12 MED ORDER — TORSEMIDE 20 MG PO TABS: ORAL_TABLET | ORAL | 3 refills | Status: DC

## 2016-05-12 MED ORDER — ACETAMINOPHEN 650 MG RE SUPP: 650.0000 mg | Freq: Four times a day (QID) | RECTAL | Status: DC | PRN

## 2016-05-12 MED ORDER — AMIODARONE HCL 200 MG PO TABS: ORAL_TABLET | ORAL | 0 refills | Status: DC

## 2016-05-12 MED ORDER — CLONAZEPAM 0.5 MG PO TABS
2.0000 mg | ORAL_TABLET | Freq: Three times a day (TID) | ORAL | Status: DC | PRN
  Administered 2016-05-13 – 2016-05-15 (×4): 2 mg via ORAL
  Filled 2016-05-12 (×4): qty 4

## 2016-05-12 MED ORDER — ASPIRIN EC 81 MG PO TBEC
81.0000 mg | DELAYED_RELEASE_TABLET | Freq: Every day | ORAL | Status: DC
  Administered 2016-05-12 – 2016-05-16 (×5): 81 mg via ORAL
  Filled 2016-05-12 (×5): qty 1

## 2016-05-12 MED ORDER — ALLOPURINOL 100 MG PO TABS
100.0000 mg | ORAL_TABLET | Freq: Every day | ORAL | Status: DC
  Administered 2016-05-13 – 2016-05-16 (×4): 100 mg via ORAL
  Filled 2016-05-12 (×4): qty 1

## 2016-05-12 MED ORDER — SODIUM CHLORIDE 0.9 % IV SOLN
INTRAVENOUS | Status: DC
  Administered 2016-05-12: 15:00:00 via INTRAVENOUS

## 2016-05-12 MED ORDER — ATORVASTATIN CALCIUM 20 MG PO TABS
20.0000 mg | ORAL_TABLET | Freq: Every day | ORAL | Status: DC
  Administered 2016-05-12 – 2016-05-15 (×4): 20 mg via ORAL
  Filled 2016-05-12 (×4): qty 1

## 2016-05-12 MED ORDER — ISOSORBIDE MONONITRATE ER 30 MG PO TB24: 30.0000 mg | ORAL_TABLET | Freq: Every day | ORAL | Status: DC

## 2016-05-12 MED ORDER — TAMSULOSIN HCL 0.4 MG PO CAPS
0.4000 mg | ORAL_CAPSULE | Freq: Every day | ORAL | Status: DC
Start: 2016-05-12 — End: 2016-05-16
  Administered 2016-05-12 – 2016-05-16 (×5): 0.4 mg via ORAL
  Filled 2016-05-12 (×5): qty 1

## 2016-05-12 MED ORDER — AMIODARONE HCL 200 MG PO TABS
400.0000 mg | ORAL_TABLET | Freq: Two times a day (BID) | ORAL | Status: DC
  Administered 2016-05-12 – 2016-05-16 (×9): 400 mg via ORAL
  Filled 2016-05-12 (×9): qty 2

## 2016-05-12 MED ORDER — HEPARIN SODIUM (PORCINE) 5000 UNIT/ML IJ SOLN
5000.0000 [IU] | Freq: Three times a day (TID) | INTRAMUSCULAR | Status: DC
  Administered 2016-05-12 – 2016-05-15 (×8): 5000 [IU] via SUBCUTANEOUS
  Filled 2016-05-12 (×12): qty 1

## 2016-05-12 MED ORDER — HYDROCODONE-ACETAMINOPHEN 10-325 MG PO TABS: 1.0000 | ORAL_TABLET | Freq: Four times a day (QID) | ORAL | Status: DC | PRN

## 2016-05-12 MED ORDER — ALLOPURINOL 300 MG PO TABS: 300.0000 mg | ORAL_TABLET | Freq: Every day | ORAL | Status: DC

## 2016-05-12 MED ORDER — SODIUM CHLORIDE 0.9 % IV BOLUS (SEPSIS)
500.0000 mL | Freq: Once | INTRAVENOUS | Status: AC
  Administered 2016-05-12: 500 mL via INTRAVENOUS

## 2016-05-12 MED ORDER — PROMETHAZINE HCL 25 MG PO TABS
12.5000 mg | ORAL_TABLET | Freq: Four times a day (QID) | ORAL | Status: DC | PRN
  Administered 2016-05-12 – 2016-05-15 (×9): 12.5 mg via ORAL
  Filled 2016-05-12 (×9): qty 1

## 2016-05-12 NOTE — Patient Instructions (Addendum)
Medication Instructions: - Your physician has recommended you make the following change in your medication: 1) Stop coreg (carvedilol) 2) Start amiodarone 400 mg one tablet by mouth twice daily x 2 weeks ,     Then decrease to 400 mg one tablet by mouth once daily 3) Stop lasix 4) Start torsemide 20 mg one tablet by mouth once daily  Labwork: - Your physician recommends that you have STAT lab work today: BMP/CBC (we will call with results today)  Procedures/Testing: - Your physician has requested that you have an echocardiogram. Echocardiography is a painless test that uses sound waves to create images of your heart. It provides your doctor with information about the size and shape of your heart and how well your heart's chambers and valves are working. This procedure takes approximately one hour. There are no restrictions for this procedure.  Follow-Up: - Your physician recommends that you schedule a follow-up appointment in: 4 weeks with Chanetta Marshall, NP for Dr. [name].  Any Additional Special Instructions Will Be Listed Below (If Applicable).     If you need a refill on your cardiac medications before your next appointment, please call your pharmacy.

## 2016-05-12 NOTE — Telephone Encounter (Signed)
The patient is being admitted to day for VT/ acute renal failure. I left a message on his identified voice mail at 10:40 am to please call me to confirm he received the message that his bed was ready. I asked that he call me back to clarify where he needs to check in.

## 2016-05-12 NOTE — Telephone Encounter (Signed)
I left a message for the patient to call me directly regarding admission/ bed assignment.

## 2016-05-12 NOTE — Progress Notes (Signed)
Patient coming from Dr. Olin Pia office as a direct admission due to new onset acute on chronic renal failure with markedly elevated BUN and creatinine. Patient was seen at the cardiologist's office today for evaluation and treatment of persistent SVT with a goal of starting him on amiodarone. This was stopped due to patient's complaints and lab findings. Patient will be seen by Dr. hospitalists group for admission with cardiology team seeing him in consultation.  Linna Darner, MD [facility] 05/12/2016, 11:12 AM

## 2016-05-12 NOTE — Progress Notes (Signed)
p 
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
## 2016-05-12 NOTE — Telephone Encounter (Signed)
Late entry- I did speak with the patient ~ 11:40 am today. He was aware to go to the Admitting area at [facility] to register for his admission.
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
## 2016-05-13 DIAGNOSIS — I5042 Chronic combined systolic (congestive) and diastolic (congestive) heart failure: Secondary | ICD-10-CM

## 2016-05-13 DIAGNOSIS — E7800 Pure hypercholesterolemia, unspecified: Secondary | ICD-10-CM

## 2016-05-13 DIAGNOSIS — I255 Ischemic cardiomyopathy: Secondary | ICD-10-CM

## 2016-05-13 DIAGNOSIS — N185 Chronic kidney disease, stage 5: Secondary | ICD-10-CM

## 2016-05-13 DIAGNOSIS — I5023 Acute on chronic systolic (congestive) heart failure: Secondary | ICD-10-CM

## 2016-05-13 DIAGNOSIS — N179 Acute kidney failure, unspecified: Secondary | ICD-10-CM

## 2016-05-13 LAB — COMPREHENSIVE METABOLIC PANEL
ALBUMIN: 2.8 g/dL — AB (ref 3.5–5.0)
ALK PHOS: 79 U/L (ref 38–126)
ALT: 13 U/L — AB (ref 17–63)
AST: 12 U/L — AB (ref 15–41)
Anion gap: 12 (ref 5–15)
BILIRUBIN TOTAL: 0.6 mg/dL (ref 0.3–1.2)
BUN: 111 mg/dL — AB (ref 6–20)
CALCIUM: 8.2 mg/dL — AB (ref 8.9–10.3)
CO2: 21 mmol/L — ABNORMAL LOW (ref 22–32)
CREATININE: 5.04 mg/dL — AB (ref 0.61–1.24)
Chloride: 108 mmol/L (ref 101–111)
GFR calc Af Amer: 13 mL/min — ABNORMAL LOW (ref >60)
GFR, EST NON AFRICAN AMERICAN: 11 mL/min — AB (ref >60)
GLUCOSE: 85 mg/dL (ref 65–99)
POTASSIUM: 4.5 mmol/L (ref 3.5–5.1)
SODIUM: 141 mmol/L (ref 135–145)
TOTAL PROTEIN: 5 g/dL — AB (ref 6.5–8.1)

## 2016-05-13 LAB — PROTEIN ELECTROPHORESIS, SERUM
A/G RATIO SPE: 1.1 (ref 0.7–1.7)
ALBUMIN ELP: 3.1 g/dL (ref 2.9–4.4)
ALPHA-1-GLOBULIN: 0.4 g/dL (ref 0.0–0.4)
Alpha-2-Globulin: 0.8 g/dL (ref 0.4–1.0)
Beta Globulin: 0.9 g/dL (ref 0.7–1.3)
GAMMA GLOBULIN: 0.7 g/dL (ref 0.4–1.8)
Globulin, Total: 2.9 g/dL (ref 2.2–3.9)
TOTAL PROTEIN ELP: 6 g/dL (ref 6.0–8.5)

## 2016-05-13 LAB — CBC
HCT: 33 % — ABNORMAL LOW (ref 39.0–52.0)
Hemoglobin: 10.9 g/dL — ABNORMAL LOW (ref 13.0–17.0)
MCH: 31.9 pg (ref 26.0–34.0)
MCHC: 33 g/dL (ref 30.0–36.0)
MCV: 96.5 fL (ref 78.0–100.0)
PLATELETS: 123 10*3/uL — AB (ref 150–400)
RBC: 3.42 MIL/uL — AB (ref 4.22–5.81)
RDW: 14.2 % (ref 11.5–15.5)
WBC: 5.6 10*3/uL (ref 4.0–10.5)

## 2016-05-13 LAB — HEMOGLOBIN A1C
HEMOGLOBIN A1C: 5.4 % (ref 4.8–5.6)
MEAN PLASMA GLUCOSE: 108 mg/dL

## 2016-05-13 LAB — IMMUNOFIXATION, URINE

## 2016-05-13 MED ORDER — FUROSEMIDE 10 MG/ML IJ SOLN
80.0000 mg | Freq: Once | INTRAMUSCULAR | Status: AC
  Administered 2016-05-13: 80 mg via INTRAVENOUS
  Filled 2016-05-13: qty 8
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
## 2016-05-14 ENCOUNTER — Encounter: Payer: Self-pay | Admitting: Internal Medicine

## 2016-05-14 ENCOUNTER — Other Ambulatory Visit (HOSPITAL_COMMUNITY): Payer: Self-pay

## 2016-05-14 ENCOUNTER — Inpatient Hospital Stay (HOSPITAL_COMMUNITY): Payer: Medicaid Other

## 2016-05-14 DIAGNOSIS — I509 Heart failure, unspecified: Secondary | ICD-10-CM

## 2016-05-14 LAB — SODIUM, URINE, RANDOM: SODIUM UR: 126 mmol/L

## 2016-05-14 LAB — ECHOCARDIOGRAM COMPLETE
HEIGHTINCHES: 70 in
WEIGHTICAEL: 2969.6 [oz_av]

## 2016-05-14 LAB — CBC WITH DIFFERENTIAL/PLATELET
BASOS ABS: 0 10*3/uL (ref 0.0–0.1)
Basophils Relative: 0 %
EOS ABS: 0.4 10*3/uL (ref 0.0–0.7)
EOS PCT: 6 %
HCT: 36.1 % — ABNORMAL LOW (ref 39.0–52.0)
HEMOGLOBIN: 11.9 g/dL — AB (ref 13.0–17.0)
Lymphocytes Relative: 24 %
Lymphs Abs: 1.5 10*3/uL (ref 0.7–4.0)
MCH: 31.7 pg (ref 26.0–34.0)
MCHC: 33 g/dL (ref 30.0–36.0)
MCV: 96.3 fL (ref 78.0–100.0)
Monocytes Absolute: 0.6 10*3/uL (ref 0.1–1.0)
Monocytes Relative: 10 %
NEUTROS PCT: 60 %
Neutro Abs: 3.8 10*3/uL (ref 1.7–7.7)
PLATELETS: 160 10*3/uL (ref 150–400)
RBC: 3.75 MIL/uL — AB (ref 4.22–5.81)
RDW: 14.1 % (ref 11.5–15.5)
WBC: 6.3 10*3/uL (ref 4.0–10.5)

## 2016-05-14 LAB — URINALYSIS, ROUTINE W REFLEX MICROSCOPIC
BILIRUBIN URINE: NEGATIVE
Glucose, UA: NEGATIVE mg/dL
Hgb urine dipstick: NEGATIVE
KETONES UR: NEGATIVE mg/dL
LEUKOCYTES UA: NEGATIVE
NITRITE: NEGATIVE
PROTEIN: NEGATIVE mg/dL
Specific Gravity, Urine: 1.009 (ref 1.005–1.030)
pH: 6 (ref 5.0–8.0)

## 2016-05-14 LAB — BASIC METABOLIC PANEL
ANION GAP: 9 (ref 5–15)
BUN: 88 mg/dL — ABNORMAL HIGH (ref 6–20)
CALCIUM: 8.9 mg/dL (ref 8.9–10.3)
CHLORIDE: 110 mmol/L (ref 101–111)
CO2: 25 mmol/L (ref 22–32)
CREATININE: 4.35 mg/dL — AB (ref 0.61–1.24)
GFR calc non Af Amer: 14 mL/min — ABNORMAL LOW (ref >60)
GFR, EST AFRICAN AMERICAN: 16 mL/min — AB (ref >60)
Glucose, Bld: 83 mg/dL (ref 65–99)
Potassium: 4.3 mmol/L (ref 3.5–5.1)
SODIUM: 144 mmol/L (ref 135–145)

## 2016-05-14 LAB — CREATININE, URINE, RANDOM: CREATININE, URINE: 29.78 mg/dL

## 2016-05-14 LAB — MAGNESIUM: MAGNESIUM: 2 mg/dL (ref 1.7–2.4)

## 2016-05-14 MED ORDER — PERFLUTREN LIPID MICROSPHERE
INTRAVENOUS | Status: AC
  Filled 2016-05-14: qty 10

## 2016-05-14 MED ORDER — PERFLUTREN LIPID MICROSPHERE
1.0000 mL | INTRAVENOUS | Status: AC | PRN
  Administered 2016-05-14: 2 mL via INTRAVENOUS
  Filled 2016-05-14: qty 10

## 2016-05-14 MED ORDER — FUROSEMIDE 80 MG PO TABS
80.0000 mg | ORAL_TABLET | Freq: Every day | ORAL | Status: AC
  Administered 2016-05-14: 80 mg via ORAL
  Filled 2016-05-14: qty 1

## 2016-05-14 NOTE — Progress Notes (Signed)
S: No new CO other than he seems to be under the impression that he was here all day yest and no one talked to him?  He seems  more interested in getting back to work O:BP 91/61 (BP Location: Left Arm)   Pulse (!) 55   Temp 97.3 F (36.3 C) (Oral)   Resp 12   Ht 5\' 10"  (1.778 m)   Wt 84.2 kg (185 lb 9.6 oz)   SpO2 95%   BMI 26.63 kg/m   Intake/Output Summary (Last 24 hours) at 05/14/16 0858 Last data filed at 05/14/16 0416  Gross per 24 hour  Intake              360 ml  Output             2850 ml  Net            -2490 ml   Weight change: -3.856 kg (-8 lb 8 oz) GGE:ZMOQH and alert UTM:LYYTK, reg Resp:clear Abd:+ BS NTND Ext: tr -1+ edema NEURO:CNI M&SI Ox3   . allopurinol  100 mg Oral Daily  . amiodarone  400 mg Oral BID  . aspirin EC  81 mg Oral Daily  . atorvastatin  20 mg Oral q1800  . heparin  5,000 Units Subcutaneous Q8H  . tamsulosin  0.4 mg Oral Daily   Dg Chest 2 View  Result Date: 05/12/2016 CLINICAL DATA:  Renal failure and hypotension EXAM: CHEST  2 VIEW COMPARISON:  04/02/2016 FINDINGS: The cardiac shadow is stable. Defibrillator is again noted and stable. No focal infiltrate or sizable effusion is seen. Some chronic changes are noted in the left lung base IMPRESSION: No acute abnormality noted. Electronically Signed   By: Inez Catalina M.D.   On: 05/12/2016 16:36   US Renal  Result Date: 05/13/2016 CLINICAL DATA:  Acute renal failure. EXAM: RENAL / URINARY TRACT ULTRASOUND COMPLETE COMPARISON:  CT 02/28/2014 FINDINGS: Right Kidney: Length: 9.5 cm. Renal contours are lobular. Cyst arising from the lower pole measures 4.1 x 4.5 x 5.8 cm. Smaller cyst in the upper kidney measures 1.5 cm. There is thinning of the renal parenchyma. Small stones on prior CT are not seen sonographically. Left Kidney: Length: 11.6 cm. Renal contours are lobular and there is thinning of the renal parenchymal. Renal calcifications on prior CT are not seen sonographically. Bladder: Appears  normal for degree of bladder distention. IMPRESSION: 1. No obstructive uropathy. 2. Thinning of the bilateral renal parenchyma with lobular contours, chronic. 3. Right renal cysts. Electronically Signed   By: Jeb Levering M.D.   On: 05/13/2016 01:42   BMET    Component Value Date/Time   NA 144 05/14/2016 0350   NA 143 05/02/2012   K 4.3 05/14/2016 0350   CL 110 05/14/2016 0350   CO2 25 05/14/2016 0350   GLUCOSE 83 05/14/2016 0350   BUN 88 (H) 05/14/2016 0350   BUN 25 (A) 05/02/2012   CREATININE 4.35 (H) 05/14/2016 0350   CREATININE 1.79 (H) 10/21/2015 1531   CALCIUM 8.9 05/14/2016 0350   GFRNONAA 14 (L) 05/14/2016 0350   GFRAA 16 (L) 05/14/2016 0350   CBC    Component Value Date/Time   WBC 6.3 05/14/2016 0350   RBC 3.75 (L) 05/14/2016 0350   HGB 11.9 (L) 05/14/2016 0350   HCT 36.1 (L) 05/14/2016 0350   PLT 160 05/14/2016 0350   MCV 96.3 05/14/2016 0350   MCH 31.7 05/14/2016 0350   MCHC 33.0 05/14/2016 0350   RDW  14.1 05/14/2016 0350   LYMPHSABS 1.5 05/14/2016 0350   MONOABS 0.6 05/14/2016 0350   EOSABS 0.4 05/14/2016 0350   BASOSABS 0.0 05/14/2016 0350     Assessment:  1. Acute on CKD 3 (baseline 1.6-2) most likely due to hemodynamic alterations from poor CO and low BP.  US shows NO obstruction. SPEP no M-spike  Scr trending down, UO excellent 2. VT with ICD shock  3  CHF  Plan: 1.  He can have another dose of lasix today and use PRN for now.  2. Recheck renal fx in AM.  Anticipate renal fx to cont to improve.   Denise Bramblett T
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
## 2016-05-14 NOTE — Progress Notes (Signed)
Advanced Heart Failure Rounding Note  Referring Physician: Dr. [name] Primary Physician: Lelon Huh, MD Primary Cardiologist:  Dr. [name]  Reason for Consultation:  Marked AKI / A/C systolic HF  Subjective:    Admitted 05/12/16 with ARF on CKD stage III thought to be 2/2 ATN with frequent VT over past week including one shock.   Echo pending.  No VT overnight. 2 episodes of 2-3 beats labelled as "NSVT". Looks like artifact.  US Renal 05/12/16 with no obstructive uropathy, R renal cysts, and chronic thinning of bilateral renal parenchyma with lobular contours.   Nephrology has seen. Think also likely VT related with good chance of renal recovery.   Pts only complaint is that "No-one is talking to him", this despite [place] having multiple, long (>15-20 minute) conversations over the past 2 days. States he is agitated. Denies fatigue or SOB. Urinating well.   Negative 2.4 L with one dose 80 mg IV lasix yesterday. Weight down 7 lbs.    Objective:   Weight Range: 185 lb 9.6 oz (84.2 kg) Body mass index is 26.63 kg/m.   Vital Signs:   Temp:  [97.3 F (36.3 C)-98 F (36.7 C)] 97.3 F (36.3 C) (10/12 0416) Pulse Rate:  [55-100] 55 (10/12 0416) Resp:  [12-16] 12 (10/12 0416) BP: (91-103)/(58-61) 91/61 (10/12 0416) SpO2:  [95 %-96 %] 95 % (10/12 0416) Weight:  [185 lb 9.6 oz (84.2 kg)] 185 lb 9.6 oz (84.2 kg) (10/12 0416) Last BM Date: 05/12/16  Weight change: Filed Weights   05/12/16 1301 05/13/16 0500 05/14/16 0416  Weight: 194 lb 1.6 oz (88 kg) 192 lb 11.2 oz (87.4 kg) 185 lb 9.6 oz (84.2 kg)    Intake/Output:   Intake/Output Summary (Last 24 hours) at 05/14/16 0848 Last data filed at 05/14/16 0416  Gross per 24 hour  Intake              360 ml  Output             2850 ml  Net            -2490 ml     Physical Exam: General:  NAD HEENT: Normal Neck: supple. JVP ~8-9 with mild HJR. Carotids 2+ bilat; no bruits. No thyromegaly or nodule noted. Cor: PMI  nondisplaced. Distant, bradycardic. Difficult to appreciate any rubs, gallops or murmurs.  Lungs: Mildly decreased basilar sounds, otherwise clear.  Abdomen: soft, NT, ND, no HSM. No bruits or masses. +BS  Extremities: no cyanosis, clubbing, rash. Trace to 1 1+ edema. Warm to the touch Neuro: alert & orientedx3, cranial nerves grossly intact. moves all 4 extremities w/o difficulty. Affect Flat  Telemetry: Reviewed, Loletha Grayer 50-60s. No further VT.   Labs: CBC  Recent Labs  05/12/16 0834 05/13/16 0411 05/14/16 0350  WBC 6.2 5.6 6.3  NEUTROABS 4.2  --  3.8  HGB 12.2* 10.9* 11.9*  HCT 38.4* 33.0* 36.1*  MCV 99.2 96.5 96.3  PLT 159 123* 960   Basic Metabolic Panel  Recent Labs  05/12/16 1707 05/13/16 0411 05/14/16 0350  NA  --  141 144  K  --  4.5 4.3  CL  --  108 110  CO2  --  21* 25  GLUCOSE  --  85 83  BUN  --  111* 88*  CREATININE  --  5.04* 4.35*  CALCIUM  --  8.2* 8.9  MG 2.1  --  2.0  PHOS 8.1*  --   --    Liver  Function Tests  Recent Labs  05/13/16 0411  AST 12*  ALT 13*  ALKPHOS 79  BILITOT 0.6  PROT 5.0*  ALBUMIN 2.8*   No results for input(s): LIPASE, AMYLASE in the last 72 hours. Cardiac Enzymes  Recent Labs  05/12/16 1707  TROPONINI 0.04*    BNP: BNP (last 3 results)  Recent Labs  05/12/16 1707  BNP 188.5*  182.9*    ProBNP (last 3 results) No results for input(s): PROBNP in the last 8760 hours.   D-Dimer No results for input(s): DDIMER in the last 72 hours. Hemoglobin A1C  Recent Labs  05/12/16 1707  HGBA1C 5.4   Fasting Lipid Panel No results for input(s): CHOL, HDL, LDLCALC, TRIG, CHOLHDL, LDLDIRECT in the last 72 hours. Thyroid Function Tests  Recent Labs  05/12/16 1707  TSH 2.447    Other results:     Imaging/Studies:  Dg Chest 2 View  Result Date: 05/12/2016 CLINICAL DATA:  Renal failure and hypotension EXAM: CHEST  2 VIEW COMPARISON:  04/02/2016 FINDINGS: The cardiac shadow is stable. Defibrillator is  again noted and stable. No focal infiltrate or sizable effusion is seen. Some chronic changes are noted in the left lung base IMPRESSION: No acute abnormality noted. Electronically Signed   By: Inez Catalina M.D.   On: 05/12/2016 16:36   US Renal  Result Date: 05/13/2016 CLINICAL DATA:  Acute renal failure. EXAM: RENAL / URINARY TRACT ULTRASOUND COMPLETE COMPARISON:  CT 02/28/2014 FINDINGS: Right Kidney: Length: 9.5 cm. Renal contours are lobular. Cyst arising from the lower pole measures 4.1 x 4.5 x 5.8 cm. Smaller cyst in the upper kidney measures 1.5 cm. There is thinning of the renal parenchyma. Small stones on prior CT are not seen sonographically. Left Kidney: Length: 11.6 cm. Renal contours are lobular and there is thinning of the renal parenchymal. Renal calcifications on prior CT are not seen sonographically. Bladder: Appears normal for degree of bladder distention. IMPRESSION: 1. No obstructive uropathy. 2. Thinning of the bilateral renal parenchyma with lobular contours, chronic. 3. Right renal cysts. Electronically Signed   By: Jeb Levering M.D.   On: 05/13/2016 01:42    Latest Echo  Latest Cath   Medications:     Scheduled Medications: . allopurinol  100 mg Oral Daily  . amiodarone  400 mg Oral BID  . aspirin EC  81 mg Oral Daily  . atorvastatin  20 mg Oral q1800  . heparin  5,000 Units Subcutaneous Q8H  . tamsulosin  0.4 mg Oral Daily    Infusions:    PRN Medications: acetaminophen **OR** acetaminophen, clonazePAM, HYDROcodone-acetaminophen, promethazine   Assessment/Plan   1. Acute on Chronic Systolic CHF    --recent echo EF 45-50% 2. VT w/ICD shock 05/08/16 3. 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 Baseline creatinine 1.5 4. Recent history of severe sepsis/cellulitis 5. CAD s/p CABG 6. Bradycardia  No further VT. Continue Amio 400 mg BID.  Pressures soft chronically.   K 4.3 and Mg 2.0. Supp as needed.    Nephrology has seen. Agree  most likely VT related. Creatinine 5.9 -> 5.0 -> 4.3. Baseline 1.6-1.9.    Per renal will give one dose of ORAL lasix 80 mg today and gauge response.   Pt states again today that no-one has told him: 1. Why he is here => have explained upwards of 5 times VT likely lead to ATN, in layman's terms 2. What we are doing to fix him =>  I again reviewed rate control, IV fluid and diuretics, adjustment of medications, and diagnostic imaging and labs. 3. What he can expect in the future concerning work, life expectancy, etc => Have discussed multiple times we expect his kidneys to improve, but have to take it a day at a time.  He will likely be able to return to work but require close monitoring at least for the near future.  I have asked nurse to provide patient with a notepad to write down his questions, so he can refer back to provided answers in attempt to help him better understand his current problems.   Will review Echo with MD once results available.   Length of Stay: 2  Shirley Friar PA-C 05/14/2016, 8:48 AM  Advanced Heart Failure Team Pager [phone] (M-F; 7a - 4p)  Please contact [facility] for night-coverage after hours (4p -7a ) and weekends on [email or web address]  Agree with PA's note.  He is slowly improving, probably hemodynamically-mediated VT in the setting of ATN.  Dr [name] and Dr Posey Pronto with nephrology following now as well.  We do not have much to add at this point, will sign off for now unless further questions arise.   Loralie Champagne 05/14/2016 1:03 PM

## 2016-05-14 NOTE — Progress Notes (Signed)
PROGRESS NOTE    Richard Cook  UYQ:[id number] DOB: 1955/08/10 DOA: 05/12/2016 PCP: Lelon Huh, MD  Brief Narrative:  Richard Cook is a 60 y.o. male with medical history significant of anxiety, chronic back pain, CAD/ST EMI/CABG, CHF with 35% EF, CKD stage III, DVT, HLD, HTN, gout, nocturnal hypoxemia, rheumatoid arthritis, SVT, residing to [facility] for admission after having labs drawn at his cardiologist's office showing acute on chronic renal failure. Of note patient was discharged from [facility] on 04/04/2016 after 4 day admission for sepsis secondary to left lower extremity cellulitis. Patient has completed his antibiotics and has no further symptoms at this time other than a sign lower extremity swelling. Patient states that after discharge from the hospital he felt that his baseline health. Approximately 1 week ago patient developed gradual onset nausea and dizziness. When patient went to lie down due to his symptoms he also noticed that his heart was beating very fast and had a sensation of palpitations. Denied any associated chest pain or shortness of breath. Patient states that after resting for a minute or 2 his defibrillator went off. After recovering from the shock patient so that his heartbeat had slowed significantly and a check of his vital signs showed a blood pressure of 80/50. Since that time blood pressures have remained in the 63-87 systolic range. Patient states his typical blood pressures around 564 systolic. Since defibrillating episode patient states he is felt well again until the morning of 05/12/2016 when he developed nausea again. Patient went to his cardiologist's office where labs were drawn showing acute renal failure. He was admitted for Acute Renal Failure on CKD III and Acute Decompensation of Systolic CHF with EF of 33-29%.Marland Kitchen He was given IV Lasix in which he responded well and per Renal will be given another one today. Continue to  monitor.  Assessment & Plan:   Active Problems:   Cardiomyopathy, ischemic   VT (ventricular tachycardia) (HCC)   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, stage 5 (HCC)   Renal failure   Chronic combined systolic and diastolic congestive heart failure (HCC)   CHF (congestive heart failure) (HCC)   Acute on chronic systolic CHF (congestive heart failure) ([place])  AKI on CKD Stage 3 likely from Hypotension in the setting of Hypotension and prolonged VT. -On day of admission BUN and creatinine are 114 and 5.94 respectively with baseline of 16 and 1.7. IMPROVED To 88/4.35 today after IV Lasix yesterday; Nephro Recommended additional po Lasix 80 mg today - Nephrology consult by Cardiology and appreciated Recc's;  - Renal ultrasound showed No obstructive uropathy. Thinning of the bilateral renal parenchyma with lobular contours,chronic and Right renal cysts. - D/C'd Fluid yesterday - Tamsulosin 0.4 mg po Daily - SPEP unremarkable - Repeat BMP in AM   Acute Decompensation of Chronic combined systolic and diastolic congestive heart failure with EF of 45-50% (now improved to 50-55%) and Grade 1 Diastolic Dysfunction - Patient endorsed 13 pound weight gain over the last 2 weeks. Improved weight loss today. -CXR showed No acute abnormality noted, BNP was 188.5 - Diuresis per CHF team and discussion with Neurology - consulted by Cardiology.  - Repeat Echo per Cardiology Done and showed The cavity size was mildly dilated. Wall   thickness was normal. Systolic function was normal. The estimated ejection fraction was in the range of 50% to 55%. There is hypokinesis of the apical myocardium. Doppler parameters are   consistent with abnormal left ventricular  relaxation (grade 1 diastolic dysfunction).  VT s/p ICD Shock on 05/08/16 - Patient with recent defibrillator event approximately one week ago. Followed by Dr. [name] cardiology. - EP and Heart Failure Team  following - Resume. Amiodarone at 400 mg po BID per Cardiology; Carvedilol D/C'd by Dr. [name].  - 2-D done and showed the cavity size was mildly dilated. Wall thickness was normal. Systolic function was normal. The estimated ejection fraction was in the range of 50% to 55%. There is hypokinesis of the apical myocardium. Doppler parameters are consistent with abnormal left ventricular relaxation (grade 1 diastolic dysfunction).  Hx of CAD/STEMI s/p CABG - No current complaints concerning for ACS - C/w Telemetry - Continue Lipitor, ASA, Imdur; Hold Carvedilol per Cards - Cardiology following and appreciate Recc's  Anxiety - Continue Clonazepam 2 mg po TIDprn  Chronic pain: At baseline - Continue Norco  DVT prophylaxis: Heparin Sq Code Status:  FULL Family Communication: No Family present at Bedside Disposition Plan: HOME at D/C  Consultants:   Cardiology EP and Heart Failure Team  Nephrology  Procedures:  None  Antimicrobials: - NONE  Subjective: Patient was seen and examined at bedside and was frustrated because he felt as if he was being told different stories from all the providers. He was upset that no one could tell him why he was here when it was told to him multiple times. He was concerned because it was mentioned to him that he would need dialysis but after conversation with the Nephrologist he seemed optimistic. Was frustrated that he was in the hospital and not working and concerned about his bills piling up. Denied any Cp, SOB, N/V Abdominal pain and stated that his legs were less swollen. No other complaints or concerns at this time.   Objective: Vitals:   05/13/16 1549 05/13/16 1940 05/14/16 0416 05/14/16 1333  BP: (!) 96/58 (!) 103/59 91/61 107/72  Pulse: 100 66 (!) 55 64  Resp:  16 12   Temp: 98 F (36.7 C) 98 F (36.7 C) 97.3 F (36.3 C) 97.8 F (36.6 C)  TempSrc: Oral Oral Oral Oral  SpO2: 96% 96% 95% 97%  Weight:   84.2 kg (185 lb 9.6 oz)    Height:        Intake/Output Summary (Last 24 hours) at 05/14/16 1731 Last data filed at 05/14/16 1300  Gross per 24 hour  Intake              480 ml  Output             3200 ml  Net            -2720 ml   Filed Weights   05/12/16 1301 05/13/16 0500 05/14/16 0416  Weight: 88 kg (194 lb 1.6 oz) 87.4 kg (192 lb 11.2 oz) 84.2 kg (185 lb 9.6 oz)   Examination: Physical Exam:  Constitutional: WN/WD, NAD and appears agitated sitting at bedside.  Eyes:  lids and conjunctivae normal, sclerae anicteric  ENMT: External Ears, Nose appear normal. Grossly normal hearing. Mucous membranes are moist.  Neck: Appears normal, supple, no cervical masses, normal ROM, no appreciable thyromegaly, Mild JVD.  Respiratory: Diminished breath sounds with no wheezing, rales, rhonchi or crackles. Normal respiratory effort and patient is not tachypenic. No accessory muscle use.  Cardiovascular: Loletha Grayer heart rate but regular rhythm, no murmurs / rubs / gallops. S1 and S2 auscultated. Decreased extremity edema but still mildly present. 2+ pedal pulses.  Abdomen: Soft, non-tender, non-distended. No masses  palpated. No appreciable hepatosplenomegaly. Bowel sounds positive x4.  GU: Deferred. Musculoskeletal: No clubbing / cyanosis of digits/nails. No joint deformity upper and lower extremities. No contractures. Normal strength and muscle tone.  Skin: No rashes, lesions, ulcers. No induration; Warm and dry.  Neurologic: CN 2-12 grossly intact with no focal deficits. Sensation intact in all 4 Extremities. Romberg sign cerebellar reflexes not assessed.  Psychiatric: Normal judgment and insight. Alert and oriented x 3. Agitated mood and flat affect.   Data Reviewed: I have personally reviewed following labs and imaging studies  CBC:  Recent Labs Lab 05/12/16 0834 05/13/16 0411 05/14/16 0350  WBC 6.2 5.6 6.3  NEUTROABS 4.2  --  3.8  HGB 12.2* 10.9* 11.9*  HCT 38.4* 33.0* 36.1*  MCV 99.2 96.5 96.3  PLT 159 123*  109   Basic Metabolic Panel:  Recent Labs Lab 05/12/16 0834 05/12/16 1707 05/13/16 0411 05/14/16 0350  NA 138  --  141 144  K 4.6  --  4.5 4.3  CL 103  --  108 110  CO2 23  --  21* 25  GLUCOSE 118*  --  85 83  BUN 114*  --  111* 88*  CREATININE 5.94*  --  5.04* 4.35*  CALCIUM 9.0  --  8.2* 8.9  MG  --  2.1  --  2.0  PHOS  --  8.1*  --   --    GFR: Estimated Creatinine Clearance: 18.9 mL/min (by C-G formula based on SCr of 4.35 mg/dL (H)). Liver Function Tests:  Recent Labs Lab 05/13/16 0411  AST 12*  ALT 13*  ALKPHOS 79  BILITOT 0.6  PROT 5.0*  ALBUMIN 2.8*   No results for input(s): LIPASE, AMYLASE in the last 168 hours. No results for input(s): AMMONIA in the last 168 hours. Coagulation Profile: No results for input(s): INR, PROTIME in the last 168 hours. Cardiac Enzymes:  Recent Labs Lab 05/12/16 1707  TROPONINI 0.04*   BNP (last 3 results) No results for input(s): PROBNP in the last 8760 hours. HbA1C:  Recent Labs  05/12/16 1707  HGBA1C 5.4   CBG: No results for input(s): GLUCAP in the last 168 hours. Lipid Profile: No results for input(s): CHOL, HDL, LDLCALC, Richard, CHOLHDL, LDLDIRECT in the last 72 hours. Thyroid Function Tests:  Recent Labs  05/12/16 1707  TSH 2.447   Anemia Panel: No results for input(s): VITAMINB12, FOLATE, FERRITIN, TIBC, IRON, RETICCTPCT in the last 72 hours. Sepsis Labs: No results for input(s): PROCALCITON, LATICACIDVEN in the last 168 hours.  No results found for this or any previous visit (from the past 240 hour(s)).   Radiology Studies: US Renal  Result Date: 05/13/2016 CLINICAL DATA:  Acute renal failure. EXAM: RENAL / URINARY TRACT ULTRASOUND COMPLETE COMPARISON:  CT 02/28/2014 FINDINGS: Right Kidney: Length: 9.5 cm. Renal contours are lobular. Cyst arising from the lower pole measures 4.1 x 4.5 x 5.8 cm. Smaller cyst in the upper kidney measures 1.5 cm. There is thinning of the renal parenchyma. Small  stones on prior CT are not seen sonographically. Left Kidney: Length: 11.6 cm. Renal contours are lobular and there is thinning of the renal parenchymal. Renal calcifications on prior CT are not seen sonographically. Bladder: Appears normal for degree of bladder distention. IMPRESSION: 1. No obstructive uropathy. 2. Thinning of the bilateral renal parenchyma with lobular contours, chronic. 3. Right renal cysts. Electronically Signed   By: Jeb Levering M.D.   On: 05/13/2016 01:42   Scheduled Meds: . allopurinol  100 mg Oral Daily  . amiodarone  400 mg Oral BID  . aspirin EC  81 mg Oral Daily  . atorvastatin  20 mg Oral q1800  . heparin  5,000 Units Subcutaneous Q8H  . tamsulosin  0.4 mg Oral Daily   Continuous Infusions:    LOS: 2 days   Kerney Elbe, DO [facility] Pager [phone]  If 7PM-7AM, please contact night-coverage [email or web address] Password [facility] 05/14/2016, 5:31 PM

## 2016-05-14 NOTE — Progress Notes (Signed)
  Echocardiogram 2D Echocardiogram with Definity has been performed.  Richard Cook 05/14/2016, 9:36 AM

## 2016-05-15 LAB — PROTEIN ELECTROPHORESIS, SERUM
A/G RATIO SPE: 1.1 (ref 0.7–1.7)
ALPHA-2-GLOBULIN: 0.8 g/dL (ref 0.4–1.0)
Albumin ELP: 3 g/dL (ref 2.9–4.4)
Alpha-1-Globulin: 0.3 g/dL (ref 0.0–0.4)
BETA GLOBULIN: 0.9 g/dL (ref 0.7–1.3)
Gamma Globulin: 0.7 g/dL (ref 0.4–1.8)
Globulin, Total: 2.7 g/dL (ref 2.2–3.9)
Total Protein ELP: 5.7 g/dL — ABNORMAL LOW (ref 6.0–8.5)

## 2016-05-15 LAB — BASIC METABOLIC PANEL
ANION GAP: 13 (ref 5–15)
BUN: 68 mg/dL — ABNORMAL HIGH (ref 6–20)
CO2: 25 mmol/L (ref 22–32)
Calcium: 9.4 mg/dL (ref 8.9–10.3)
Chloride: 105 mmol/L (ref 101–111)
Creatinine, Ser: 3.93 mg/dL — ABNORMAL HIGH (ref 0.61–1.24)
GFR, EST AFRICAN AMERICAN: 18 mL/min — AB (ref >60)
GFR, EST NON AFRICAN AMERICAN: 15 mL/min — AB (ref >60)
GLUCOSE: 91 mg/dL (ref 65–99)
POTASSIUM: 3.8 mmol/L (ref 3.5–5.1)
Sodium: 143 mmol/L (ref 135–145)

## 2016-05-15 LAB — KAPPA/LAMBDA LIGHT CHAINS
KAPPA, LAMDA LIGHT CHAIN RATIO: 0.86 (ref 0.26–1.65)
Kappa free light chain: 37.7 mg/L — ABNORMAL HIGH (ref 3.3–19.4)
Lambda free light chains: 44 mg/L — ABNORMAL HIGH (ref 5.7–26.3)

## 2016-05-15 LAB — UREA NITROGEN, URINE: Urea Nitrogen, Ur: 289 mg/dL

## 2016-05-15 MED ORDER — ZOLPIDEM TARTRATE 5 MG PO TABS
5.0000 mg | ORAL_TABLET | Freq: Every evening | ORAL | Status: DC | PRN
  Administered 2016-05-15: 5 mg via ORAL
  Filled 2016-05-15: qty 1

## 2016-05-15 MED ORDER — FUROSEMIDE 40 MG PO TABS
40.0000 mg | ORAL_TABLET | Freq: Two times a day (BID) | ORAL | Status: DC
  Administered 2016-05-15 – 2016-05-16 (×2): 40 mg via ORAL
  Filled 2016-05-15 (×2): qty 1

## 2016-05-15 NOTE — Clinical Social Work Note (Signed)
Clinical Social Work Assessment  Patient Details  Name: Richard Cook MRN: [id number] Date of Birth: 1956-04-26  Date of referral:  05/15/16               Reason for consult:  Housing Concerns/Homelessness, [employer], Meals                Permission sought to share information with:    Permission granted to share information::  No  Name::        Agency::     Relationship::     Contact Information:     Housing/Transportation Living arrangements for the past 2 months:  Hotel/Motel Source of Information:  Patient, Medical Team Patient Interpreter Needed:  None Criminal Activity/Legal Involvement Pertinent to Current Situation/Hospitalization:  No - Comment as needed Significant Relationships:  Siblings, Friend Lives with:  Self Do you feel safe going back to the place where you live?  Yes Need for family participation in patient care:  Yes (Comment)  Care giving concerns:  Patient living in a motel.   Social Worker assessment / plan:  Per RNCM, patient in need of lists of food resources and places to stay. Patient is living in a motel and is on a tight budget. Patient has no insurance. CSW met with patient. No supports at bedside. CSW introduced role and inquired about any resources needed. Patient reports that due to his frequent hospitalizations within the past 10 years, he has been unable to keep a job. He has been living in a motel for "a while" and $700 pays for the hotel, electricity, and water. Patient budgets for food. Patient saw a judge for disability in January 2017. The judge reportedly told him that he was disabled but could not give him disability because he made over $10,000 last year. Patient is hopeful that an MD can assist with this as well. Patient states that he is 2 years away from early retirement. Patient declined food resources but was agreeable to low-income housing and shelter resources as well as local community resources. No further concerns. CSW  signing off as social work intervention is no longer needed.  Employment status:  Full-Time, Other (Comment) (Fluctuates depending on hospitalizations) Insurance information:  Self Pay (Medicaid Pending) PT Recommendations:  Not assessed at this time Information / Referral to community resources:  Shelter, Other (Comment Required) (Food resources, [employer], Low-income housing)  Patient/Family's Response to care:  Patient agreeable to receiving resources. Patient's sister and friend supportive and involved in patient's care. Patient appreciated social work intervention.  Patient/Family's Understanding of and Emotional Response to Diagnosis, Current Treatment, and Prognosis:  Patient understands reason for consult. Patient appears happy with hospital care.  Emotional Assessment Appearance:  Appears stated age Attitude/Demeanor/Rapport:  Other (Pleasant) Affect (typically observed):  Accepting, Appropriate, Calm, Pleasant Orientation:  Oriented to Self, Oriented to Place, Oriented to  Time, Oriented to Situation Alcohol / Substance use:  Never Used Psych involvement (Current and /or in the community):  No (Comment)  Discharge Needs  Concerns to be addressed:  Basic Needs Readmission within the last 30 days:  No Current discharge risk:  Inadequate Financial Supports Barriers to Discharge:  Inadequate or no insurance   Candie Chroman, LCSW 05/15/2016, 2:19 PM

## 2016-05-15 NOTE — Progress Notes (Signed)
PRN phenergan and clonazepam administered per patient request.  RN discussed bed alarm with patient.  Patient stated he did not want the bed alarm on.  Agricultural consultant notified.

## 2016-05-15 NOTE — Hospital Discharge Follow-Up (Signed)
Transitional Care Clinic Care Coordination Note:  Admit date:  05/12/16 Discharge date: TBD   Discharge Disposition: Home when stable Patient contact: [phone] (mobile) Emergency contact(s): none  This Case Manager reviewed patient's EMR and determined patient would benefit from post-discharge medical management and chronic care management services through the [facility]. Patient has a history of ischemic cardiomyopathy, VT, CKD Stage III, chronic combined systolic and diastolic heart failure. Receiving treatment for acute kidney injury on CKD Stage III, acute decompensation of chronic combined systolic and diastolic heart failure. Patient has had two inpatient admissions in the last six months. This Case Manager met with patient to discuss the services and medical management that can be provided at the [facility]. Patient verbalized understanding and agreed to receive post-discharge care at the [name].   Patient scheduled for Transitional Care appointment on: TBD. Patient wants to schedule Transitional Care Clinic appointment after discharge. Transitional Care Case Manager will call patient after discharge to schedule appointment.  Assessment:       Home Environment: Patient lives alone. Lives in a motel per Jacqlyn Krauss, RN CM       Support System: none       Level of functioning: Independent       Home DME: none though does have BP cuff and scale for self-monitoring.       Home care services: none       Transportation: Patient drives to his medical appointments.        Food/Nutrition: Patient indicates he has access to needed food.        Medications: Patient typically gets his medications from [facility] on [address] or [facility] [address] though he has difficulty affording some medications (patient indicated he has difficulty affording atorvastin 20 mg daily and allopurinol 300 mg). Discussed pharmacy resources available at [place], and patient appreciative of information. Checked with pharmacy at [place] for prices on medications. Atorvastatin 20 mg (medication $4.00), Flomax 0.4 mg daily (free), allopurinol 300 mg (free). Patient updated and appreciative of information.        Identified Barriers: uninsured-Informed patient he would benefit from meeting with Development worker, community at [place] to determine if eligible for [employer], [employer]. Patient verbalized understanding. Additional barriers include: difficulty affording medications (patient informed of medication resources available at [facility] and [place]), lack of support system, living in a motel, need for new PCP, and joblessness. Patient indicated he just lost his job due to hospitalizations. Patient would benefit from meeting with Education officer, museum at [employer] and [employer] at [facility] appointment.        PCP: Dr. Caryn Section in [place], [place]; however, patient indicated he has been paying out of pocket to see provider, and is no longer able to afford. Patient able to designate Dr. Jarold Song (or provider of choice) as PCP after 30 days of medical management with the [facility].             Arranged services:        Services communicated to Jacqlyn Krauss, RN CM
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
## 2016-05-15 NOTE — Plan of Care (Signed)
Problem: Education: Goal: Knowledge of [facility] Education information/materials will improve Outcome: Progressing Patient aware of plan of care.  RN provided medication education on all medications administered thus far this shift.  Patient stated understanding.  Patient has denied pain thus far this shift.  RN instructed patient to notify RN if patient started to experience any pain.  Patient stated understanding.

## 2016-05-15 NOTE — Progress Notes (Signed)
PROGRESS NOTE    Richard Cook  ONG:[id number] DOB: 12-20-1955 DOA: 05/12/2016 PCP: Lelon Huh, MD  Brief Narrative:  Richard Cook is a 60 y.o. male with medical history significant of anxiety, chronic back pain, CAD/ST EMI/CABG, CHF with 35% EF, CKD stage III, DVT, HLD, HTN, gout, nocturnal hypoxemia, rheumatoid arthritis, SVT, residing to [facility] for admission after having labs drawn at his cardiologist's office showing acute on chronic renal failure. Of note patient was discharged from [facility] on 04/04/2016 after 4 day admission for sepsis secondary to left lower extremity cellulitis. Patient has completed his antibiotics and has no further symptoms at this time other than a sign lower extremity swelling. Patient states that after discharge from the hospital he felt that his baseline health. Approximately 1 week ago patient developed gradual onset nausea and dizziness. When patient went to lie down due to his symptoms he also noticed that his heart was beating very fast and had a sensation of palpitations. Denied any associated chest pain or shortness of breath. Patient states that after resting for a minute or 2 his defibrillator went off. After recovering from the shock patient so that his heartbeat had slowed significantly and a check of his vital signs showed a blood pressure of 80/50. Since that time blood pressures have remained in the 44-01 systolic range. Patient states his typical blood pressures around 027 systolic. Since defibrillating episode patient states he is felt well again until the morning of 05/12/2016 when he developed nausea again. Patient went to his cardiologist's office where labs were drawn showing acute renal failure. He was admitted for Acute Renal Failure on CKD III and Acute Decompensation of Systolic CHF with EF of 25-36%.Marland Kitchen He was given IV Lasix in which he responded well and will be transitioned back to Home dose of 40 mg po Lasix.     Assessment & Plan:   Active Problems:   Cardiomyopathy, ischemic   VT (ventricular tachycardia) (HCC)   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, stage 5 (HCC)   Renal failure   Chronic combined systolic and diastolic congestive heart failure (HCC)   CHF (congestive heart failure) (HCC)   Acute on chronic systolic CHF (congestive heart failure) ([place])  AKI on CKD Stage 3 likely from Hypotension in the setting of Hypotension and prolonged VT. -On day of admission BUN and creatinine are 114 and 5.94 respectively with baseline of 16 and 1.7. IMPROVED To 68/3.93 today after po 80 mg Lasix yesterday;  -Discussed with Dr. Mercy Moore of Nephrology and stated ok to place him back on 40 mg of Lasix po BID.  - Nephrology consult by Cardiology and appreciated Recc's;  - Renal ultrasound showed No obstructive uropathy. Thinning of the bilateral renal parenchyma with lobular contours,chronic and Right renal cysts. - Tamsulosin 0.4 mg po Daily - SPEP unremarkable - Repeat BMP in AM and if still improving, D/C Home in AM.   Acute Decompensation of Chronic combined systolic and diastolic congestive heart failure with EF of 45-50% (now improved to 50-55%) and Grade 1 Diastolic Dysfunction - Patient endorsed 13 pound weight gain over the last 2 weeks. Improved weight loss today. -CXR showed No acute abnormality noted, BNP was 188.5 - Heart Failure Team Signed Off.  - Repeat Echo per Cardiology Done and showed The cavity size was mildly dilated. Wall   thickness was normal. Systolic function was normal. The estimated ejection fraction was in the range of 50% to 55%. There  is hypokinesis of the apical myocardium. Doppler parameters are   consistent with abnormal left ventricular relaxation (grade 1 diastolic dysfunction). -C/w po Lasix 40 mg po BID in discussion with Nephrology -D/C Home in AM after repeat BMP if stable.   VT s/p ICD Shock on 05/08/16 - Patient with recent  defibrillator event approximately one week ago. Followed by Dr. [name] in Cardiology. - EP and Heart Failure Team following - C/w Amiodarone at 400 mg po BID per Cardiology; Carvedilol D/C'd by Dr. [name].  - 2-D done and showed the cavity size was mildly dilated. Wall thickness was normal. Systolic function was normal. The estimated ejection fraction was in the range of 50% to 55%. There is hypokinesis of the apical myocardium. Doppler parameters are consistent with abnormal left ventricular relaxation (grade 1 diastolic dysfunction). -Needs follow up as Outpatient   Hx of CAD/STEMI s/p CABG - No current complaints concerning for ACS - C/w Telemetry - Continue Lipitor, ASA, Imdur; Hold Carvedilol per Cards - Cardiology following and appreciate Recc's  Insomnia -Stated Clonazepam did not help -Started Zolpidem 5 mg po qHSprn for Sleep.   Anxiety - Continue Clonazepam 2 mg po TIDprn  Chronic pain: At baseline - Continue Norco  DVT prophylaxis: Heparin Sq Code Status:  FULL Family Communication: No Family present at Bedside Disposition Plan: HOME at D/C likely Tomorrow  Consultants:   Cardiology EP and Heart Failure Team  Nephrology  Procedures:  None  Antimicrobials: - NONE  Subjective: Patient was seen and examined at bedside and was frustrated because he lost his job and is having financial troubles. States he does not want to go back and forth to the hospital and was upset that he couldn't get on disability because he made over $10,000. Encouraged that his kidney functions were improving. Denied any Cp, SOB, N/V Abdominal pain and stated that his legs were less swollen and that he lost 4 more pounds. No other complaints or concerns at this time.   Objective: Vitals:   05/14/16 1333 05/14/16 2259 05/15/16 0447 05/15/16 1000  BP: 107/72 110/78 108/73 112/88  Pulse: 64 70 72   Resp:      Temp: 97.8 F (36.6 C) 98.5 F (36.9 C) 98.6 F (37 C)   TempSrc: Oral Oral  Oral   SpO2: 97% 95% 95%   Weight:   82 kg (180 lb 11.2 oz)   Height:        Intake/Output Summary (Last 24 hours) at 05/15/16 1356 Last data filed at 05/15/16 1300  Gross per 24 hour  Intake             1200 ml  Output             3350 ml  Net            -2150 ml   Filed Weights   05/13/16 0500 05/14/16 0416 05/15/16 0447  Weight: 87.4 kg (192 lb 11.2 oz) 84.2 kg (185 lb 9.6 oz) 82 kg (180 lb 11.2 oz)   Examination: Physical Exam:  Constitutional: WN/WD, NAD and laying in bed Eyes:  lids and conjunctivae normal, sclerae anicteric  ENMT: External Ears, Nose appear normal. Grossly normal hearing. Mucous membranes are moist.  Neck: Appears normal, supple, no cervical masses, normal ROM, no appreciable thyromegaly, Mild JVD.  Respiratory: Diminished breath sounds with no wheezing, rales, rhonchi or crackles. Normal respiratory effort and patient is not tachypenic. No accessory muscle use.  Cardiovascular: Loletha Grayer heart rate but regular rhythm, no  murmurs / rubs / gallops. S1 and S2 auscultated. Mild edema present. 2+ pedal pulses.  Abdomen: Soft, non-tender, non-distended. No masses palpated. No appreciable hepatosplenomegaly. Bowel sounds positive x4.  GU: Deferred. Musculoskeletal: No clubbing / cyanosis of digits/nails. No joint deformity upper and lower extremities. No contractures. Normal strength and muscle tone.  Skin: No rashes, lesions, ulcers. No induration; Warm and dry. Scars noted on Le.  Neurologic: CN 2-12 grossly intact with no focal deficits. Sensation intact in all 4 Extremities. Romberg sign cerebellar reflexes not assessed.  Psychiatric: Normal judgment and insight. Alert and oriented x 3. Agitated mood and flat affect.   Data Reviewed: I have personally reviewed following labs and imaging studies  CBC:  Recent Labs Lab 05/12/16 0834 05/13/16 0411 05/14/16 0350  WBC 6.2 5.6 6.3  NEUTROABS 4.2  --  3.8  HGB 12.2* 10.9* 11.9*  HCT 38.4* 33.0* 36.1*  MCV 99.2  96.5 96.3  PLT 159 123* 160   Basic Metabolic Panel:  Recent Labs Lab 05/12/16 0834 05/12/16 1707 05/13/16 0411 05/14/16 0350 05/15/16 0313  NA 138  --  141 144 143  K 4.6  --  4.5 4.3 3.8  CL 103  --  108 110 105  CO2 23  --  21* 25 25  GLUCOSE 118*  --  85 83 91  BUN 114*  --  111* 88* 68*  CREATININE 5.94*  --  5.04* 4.35* 3.93*  CALCIUM 9.0  --  8.2* 8.9 9.4  MG  --  2.1  --  2.0  --   PHOS  --  8.1*  --   --   --    GFR: Estimated Creatinine Clearance: 20.9 mL/min (by C-G formula based on SCr of 3.93 mg/dL (H)). Liver Function Tests:  Recent Labs Lab 05/13/16 0411  AST 12*  ALT 13*  ALKPHOS 79  BILITOT 0.6  PROT 5.0*  ALBUMIN 2.8*   No results for input(s): LIPASE, AMYLASE in the last 168 hours. No results for input(s): AMMONIA in the last 168 hours. Coagulation Profile: No results for input(s): INR, PROTIME in the last 168 hours. Cardiac Enzymes:  Recent Labs Lab 05/12/16 1707  TROPONINI 0.04*   BNP (last 3 results) No results for input(s): PROBNP in the last 8760 hours. HbA1C:  Recent Labs  05/12/16 1707  HGBA1C 5.4   CBG: No results for input(s): GLUCAP in the last 168 hours. Lipid Profile: No results for input(s): CHOL, HDL, LDLCALC, TRIG, CHOLHDL, LDLDIRECT in the last 72 hours. Thyroid Function Tests:  Recent Labs  05/12/16 1707  TSH 2.447   Anemia Panel: No results for input(s): VITAMINB12, FOLATE, FERRITIN, TIBC, IRON, RETICCTPCT in the last 72 hours. Sepsis Labs: No results for input(s): PROCALCITON, LATICACIDVEN in the last 168 hours.  No results found for this or any previous visit (from the past 240 hour(s)).   Radiology Studies: No results found. Scheduled Meds: . allopurinol  100 mg Oral Daily  . amiodarone  400 mg Oral BID  . aspirin EC  81 mg Oral Daily  . atorvastatin  20 mg Oral q1800  . furosemide  40 mg Oral BID  . heparin  5,000 Units Subcutaneous Q8H  . tamsulosin  0.4 mg Oral Daily   Continuous  Infusions:    LOS: 3 days   Kerney Elbe, DO [facility] Pager [phone]  If 7PM-7AM, please contact night-coverage [email or web address] Password [email or web address] 05/15/2016, 1:56 PM

## 2016-05-15 NOTE — Progress Notes (Signed)
S: No new CO O:BP 108/73 (BP Location: Left Arm)   Pulse 72   Temp 98.6 F (37 C) (Oral)   Resp 12   Ht 5\' 10"  (1.778 m)   Wt 82 kg (180 lb 11.2 oz)   SpO2 95%   BMI 25.93 kg/m   Intake/Output Summary (Last 24 hours) at 05/15/16 0910 Last data filed at 05/15/16 0700  Gross per 24 hour  Intake             1080 ml  Output             2950 ml  Net            -1870 ml   Weight change: -2.223 kg (-4 lb 14.4 oz) UJW:JXBJY and alert [facility]: RRR Resp:clear Abd:+ BS NTND Ext:0- tr edema NEURO:CNI M&SI Ox3   . allopurinol  100 mg Oral Daily  . amiodarone  400 mg Oral BID  . aspirin EC  81 mg Oral Daily  . atorvastatin  20 mg Oral q1800  . heparin  5,000 Units Subcutaneous Q8H  . tamsulosin  0.4 mg Oral Daily   No results found. BMET    Component Value Date/Time   NA 143 05/15/2016 0313   NA 143 05/02/2012   K 3.8 05/15/2016 0313   CL 105 05/15/2016 0313   CO2 25 05/15/2016 0313   GLUCOSE 91 05/15/2016 0313   BUN 68 (H) 05/15/2016 0313   BUN 25 (A) 05/02/2012   CREATININE 3.93 (H) 05/15/2016 0313   CREATININE 1.79 (H) 10/21/2015 1531   CALCIUM 9.4 05/15/2016 0313   GFRNONAA 15 (L) 05/15/2016 0313   GFRAA 18 (L) 05/15/2016 0313   CBC    Component Value Date/Time   WBC 6.3 05/14/2016 0350   RBC 3.75 (L) 05/14/2016 0350   HGB 11.9 (L) 05/14/2016 0350   HCT 36.1 (L) 05/14/2016 0350   PLT 160 05/14/2016 0350   MCV 96.3 05/14/2016 0350   MCH 31.7 05/14/2016 0350   MCHC 33.0 05/14/2016 0350   RDW 14.1 05/14/2016 0350   LYMPHSABS 1.5 05/14/2016 0350   MONOABS 0.6 05/14/2016 0350   EOSABS 0.4 05/14/2016 0350   BASOSABS 0.0 05/14/2016 0350     Assessment:  1. Acute on CKD 3 (baseline 1.6-2) most likely due to hemodynamic alterations from VT and low BP.  US shows NO obstruction. SPEP no M-spike  Scr trending down, UO excellent 2. VT with ICD shock  3  CHF  Plan: 1.  Recheck renal fx in AM.  If Scr improves further tomorrow then he could go from renal  standpoint   Johanna Matto T

## 2016-05-15 NOTE — Care Management Note (Addendum)
Case Management Note  Patient Details  Name: Richard Cook MRN: [id number] Date of Birth: October 06, 1955  Subjective/Objective:  Pt presented for Chronic combined systolic and diastolic congestive heart failure/ CKD Stage 3. Heart Failure team and Nephrology is following the patient. Pt is without insurance. Pt has PCP Lelon Huh. Per pt he pays out of pocket for visits-this is a financial strain for patient. Pt states he uses Product/process development scientist for Rx's. Pt states he is not able to pay for his medications at times. CM did place the number for the [facility] on the AVS for patient to call and schedule a hospital f/u for an appointment. Pt will be able to utilize the pharmacy and medications range from $4.00-$10.00. Pt states "that will help."  Pt states that he lives in a [place] and has a hard time paying for meds and buying food. CM did state she would call the CSW for assistance with Shelter and Food Pantries.                     Action/Plan: CM did call [facility] to get a cash price on Flomax. Generic cost will be 66.11. CM will make pt aware of cost. Pt feels that this is expensive. Flomax will be free @ the [facility].  [place] # placed on the AVS. CM did speak with the [place] in regards to Question eligibility for Disability/Mediciad. Pt not be a candidate due to CKD Stage 3 and EF is not low enough. Financial Counselor will return to speak with pt 05-15-16. No further needs identified at this time.   Expected Discharge Date:                  Expected Discharge Plan:  Home/Self Care  In-House Referral:  Financial Counselor Discharge planning Services  CM Consult  Post Acute Care Choice:  NA Choice offered to:  NA  DME Arranged:  N/A DME Agency:  NA  HH Arranged:  NA HH Agency:  NA  Status of Service:  Completed, signed off  If discussed at [place] of Stay Meetings, dates discussed:    Additional Comments: 1224 05-15-16 Jacqlyn Krauss, RN,BSN [phone]  CM  did speak with the Transitional Care Liaison Carmela Hurt and she states pt is a candidate for TCC. If patient is agreeable to services an appointment will be placed on the AVS. Pt will still be able to utilize the pharmacy. No further needs at this time.  Bethena Roys, RN 05/15/2016, 11:28 AM

## 2016-05-16 ENCOUNTER — Other Ambulatory Visit: Payer: Self-pay | Admitting: Family Medicine

## 2016-05-16 DIAGNOSIS — M5489 Other dorsalgia: Secondary | ICD-10-CM

## 2016-05-16 DIAGNOSIS — N289 Disorder of kidney and ureter, unspecified: Secondary | ICD-10-CM

## 2016-05-16 DIAGNOSIS — M109 Gout, unspecified: Secondary | ICD-10-CM

## 2016-05-16 LAB — BASIC METABOLIC PANEL
Anion gap: 13 (ref 5–15)
BUN: 60 mg/dL — ABNORMAL HIGH (ref 6–20)
CALCIUM: 9.9 mg/dL (ref 8.9–10.3)
CO2: 25 mmol/L (ref 22–32)
CREATININE: 3.68 mg/dL — AB (ref 0.61–1.24)
Chloride: 104 mmol/L (ref 101–111)
GFR, EST AFRICAN AMERICAN: 19 mL/min — AB (ref >60)
GFR, EST NON AFRICAN AMERICAN: 17 mL/min — AB (ref >60)
Glucose, Bld: 89 mg/dL (ref 65–99)
Potassium: 3.9 mmol/L (ref 3.5–5.1)
SODIUM: 142 mmol/L (ref 135–145)

## 2016-05-16 MED ORDER — AMIODARONE HCL 400 MG PO TABS: 400.0000 mg | ORAL_TABLET | Freq: Two times a day (BID) | ORAL | 0 refills | Status: DC

## 2016-05-16 MED ORDER — ALLOPURINOL 100 MG PO TABS: 100.0000 mg | ORAL_TABLET | Freq: Every day | ORAL | 0 refills | Status: DC

## 2016-05-16 MED ORDER — AMIODARONE HCL 200 MG PO TABS: 200.0000 mg | ORAL_TABLET | Freq: Two times a day (BID) | ORAL | 0 refills | Status: DC

## 2016-05-16 MED ORDER — FUROSEMIDE 40 MG PO TABS: 40.0000 mg | ORAL_TABLET | Freq: Two times a day (BID) | ORAL | 0 refills | Status: DC

## 2016-05-16 MED ORDER — FUROSEMIDE 40 MG PO TABS
40.0000 mg | ORAL_TABLET | Freq: Two times a day (BID) | ORAL | 0 refills | Status: DC
Start: 2016-05-16 — End: 2016-05-16

## 2016-05-16 NOTE — Progress Notes (Signed)
   SUBJECTIVE: The patient is doing well today.  He wants to go home.  At this time, he denies chest pain, shortness of breath, or any new concerns.  Marland Kitchen allopurinol  100 mg Oral Daily  . amiodarone  400 mg Oral BID  . aspirin EC  81 mg Oral Daily  . atorvastatin  20 mg Oral q1800  . furosemide  40 mg Oral BID  . heparin  5,000 Units Subcutaneous Q8H  . tamsulosin  0.4 mg Oral Daily      OBJECTIVE: Physical Exam: Vitals:   05/15/16 1403 05/15/16 2240 05/15/16 2328 05/16/16 0540  BP: 105/79 137/90  116/76  Pulse: 95  81 90  Resp: 17     Temp: 98.6 F (37 C) 98.8 F (37.1 C)  98.2 F (36.8 C)  TempSrc: Oral Oral  Oral  SpO2: 95%  96% 94%  Weight:    179 lb 8 oz (81.4 kg)  Height:        Intake/Output Summary (Last 24 hours) at 05/16/16 0812 Last data filed at 05/16/16 0540  Gross per 24 hour  Intake             1142 ml  Output             1750 ml  Net             -608 ml    Telemetry reveals sinus rhythm  GEN- The patient is well appearing, alert and oriented x 3 today.   Head- normocephalic, atraumatic Eyes-  Sclera clear, conjunctiva pink Ears- hearing intact Oropharynx- clear Neck- supple,   Lungs- Clear to ausculation bilaterally, normal work of breathing Heart- Regular rate and rhythm  GI- soft, NT, ND, + BS Extremities- no clubbing, cyanosis, or edema Skin- no rash or lesion Psych- euthymic mood, full affect Neuro- strength and sensation are intact  LABS: Basic Metabolic Panel:  Recent Labs  05/14/16 0350 05/15/16 0313 05/16/16 0514  NA 144 143 142  K 4.3 3.8 3.9  CL 110 105 104  CO2 25 25 25   GLUCOSE 83 91 89  BUN 88* 68* 60*  CREATININE 4.35* 3.93* 3.68*  CALCIUM 8.9 9.4 9.9  MG 2.0  --   --    Liver Function Tests: No results for input(s): AST, ALT, ALKPHOS, BILITOT, PROT, ALBUMIN in the last 72 hours. No results for input(s): LIPASE, AMYLASE in the last 72 hours. CBC:  Recent Labs  05/14/16 0350  WBC 6.3  NEUTROABS 3.8  HGB  11.9*  HCT 36.1*  MCV 96.3  PLT 160    ASSESSMENT AND PLAN:  1.  VT Stable No change required today Would continue amiodarone 400mg  BID x 1 week then 200mg  BID Will need close follow-up with Dr [name] (I will request appointment for within 2 weeks)  2. ICM Stable EF improved by recent echo Discharge on current medicines  3. Acute renal failure Appreciated nephrology input No further inpatient workup planned  Hypotension much improved  No further inpatient EP workup planned.  OK to discharge from our standpoint. No driving x 6 months  Electrophysiology team to see as needed while here. Please call with questions.   Thompson Grayer, MD 05/16/2016 8:12 AM

## 2016-05-16 NOTE — Plan of Care (Signed)
Problem: Fluid Volume: Goal: Ability to maintain a balanced intake and output will improve Outcome: Progressing No pitting edema noted to bilateral lower extremities.  Patient's weight has been decreasing since admission.

## 2016-05-16 NOTE — Progress Notes (Signed)
Pt being discharged home. Discharge instructions reviewed with patient. Prescriptions given to patient. Patient has no questions at this time.

## 2016-05-16 NOTE — Progress Notes (Signed)
S: No new CO.  Says he is ready to go home O:BP 116/76 (BP Location: Right Arm)   Pulse 90   Temp 98.2 F (36.8 C) (Oral)   Resp 17   Ht 5\' 10"  (1.778 m)   Wt 81.4 kg (179 lb 8 oz)   SpO2 94%   BMI 25.76 kg/m   Intake/Output Summary (Last 24 hours) at 05/16/16 0721 Last data filed at 05/16/16 0540  Gross per 24 hour  Intake             1142 ml  Output             1750 ml  Net             -608 ml   Weight change: -0.544 kg (-1 lb 3.2 oz) UQJ:FHLKT and alert CVS: RRR Resp:clear Abd:+ BS NTND Ext: no edema NEURO:CNI M&SI Ox3   . allopurinol  100 mg Oral Daily  . amiodarone  400 mg Oral BID  . aspirin EC  81 mg Oral Daily  . atorvastatin  20 mg Oral q1800  . furosemide  40 mg Oral BID  . heparin  5,000 Units Subcutaneous Q8H  . tamsulosin  0.4 mg Oral Daily   No results found. BMET    Component Value Date/Time   NA 142 05/16/2016 0514   NA 143 05/02/2012   K 3.9 05/16/2016 0514   CL 104 05/16/2016 0514   CO2 25 05/16/2016 0514   GLUCOSE 89 05/16/2016 0514   BUN 60 (H) 05/16/2016 0514   BUN 25 (A) 05/02/2012   CREATININE 3.68 (H) 05/16/2016 0514   CREATININE 1.79 (H) 10/21/2015 1531   CALCIUM 9.9 05/16/2016 0514   GFRNONAA 17 (L) 05/16/2016 0514   GFRAA 19 (L) 05/16/2016 0514   CBC    Component Value Date/Time   WBC 6.3 05/14/2016 0350   RBC 3.75 (L) 05/14/2016 0350   HGB 11.9 (L) 05/14/2016 0350   HCT 36.1 (L) 05/14/2016 0350   PLT 160 05/14/2016 0350   MCV 96.3 05/14/2016 0350   MCH 31.7 05/14/2016 0350   MCHC 33.0 05/14/2016 0350   RDW 14.1 05/14/2016 0350   LYMPHSABS 1.5 05/14/2016 0350   MONOABS 0.6 05/14/2016 0350   EOSABS 0.4 05/14/2016 0350   BASOSABS 0.0 05/14/2016 0350     Assessment:  1. Acute on CKD 3 (baseline 1.6-2) most likely due to hemodynamic alterations from VT and low BP.  US shows NO obstruction. SPEP no M-spike  Scr sl lower todau, UO excellent 2. VT with ICD shock  3  CHF  Plan: 1.  He can go from my standpoint, my  office will call him to arrange FU labs for next week  Rollan Roger T

## 2016-05-16 NOTE — Progress Notes (Signed)
Physician Discharge Summary  Jedediah Noda LZJ:[id number] DOB: 01/06/1956 DOA: 05/12/2016  PCP: Lelon Huh, MD  Admit date: 05/12/2016 Discharge date: 05/16/2016  Admitted From: Home Disposition:  Home  Recommendations for Outpatient Follow-up:  1. Follow up with PCP in 1-2 weeks 2. Follow up with Dr. Mercy Moore in Nephrology in 1 week 3. Follow up with Dr. [name] in [facility] in 1 week 4. Please obtain BMP/CBC in one week   Home Health: No Equipment/Devices: None  Discharge Condition: Stable and Improved. CODE STATUS: FULL Diet recommendation: Heart Healthy  Brief/Interim Summary: Deavin Richard Edwardsis a 60 y.o.malewith medical history significant of anxiety, chronic back pain, CAD/ST EMI/CABG, CHF with 35% EF, CKD stage III, DVT, HLD, HTN, gout, nocturnal hypoxemia, rheumatoid arthritis, SVT, residing to [facility] for admission after having labs drawn at his cardiologist's office showing acute on chronic renal failure. Of note patient was discharged from [facility] on 04/04/2016 after 4 day admission for sepsis secondary to left lower extremity cellulitis. Patient has completed his antibiotics and has no further symptoms at this time other than a sign lower extremity swelling. Patient states that after discharge from the hospital he felt that his baseline health. Approximately 1 week ago patient developed gradual onset nausea and dizziness. When patient went to lie down due to his symptoms he also noticed that his heart was beating very fast and had a sensation of palpitations. Denied any associated chest pain or shortness of breath. Patient states that after resting for a minute or 2 his defibrillator went off. After recovering from the shock patient so that his heartbeat had slowed significantly and a check of his vital signs showed a blood pressure of 80/50. Since that time blood pressures have remained in the 02-40 systolic range. Patient states his  typical blood pressures around 973 systolic. Since defibrillating episode patient states he is felt well again until the morning of 05/12/2016 when he developed nausea again. Patient went to his cardiologist's office where labs were drawn showing acute renal failure. He was admitted for Acute Renal Failure on CKD III and Acute Decompensation of Systolic CHF with EF of 53-29%. He was treated and cautiously diuresed with IV Lasix because of his Bp, but he responded well and  transitioned back to Home dose of 40 mg po BID of Lasix. He was evaluated by the Heart Failure Team, EP Cardiology, and by Nephrology. He was deemed medically stable for Discharge and was told to follow up with the [facility] for medication affordability. After Patient was D/C'd he called several times for a Refill for his Norco and Klonopin. He stated when he got home he realized he did not have any. Patient was written Scripts for #20 pills of Klonopin and #20 pills of Norco and told to follow up with Primary Care for refills.   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 -On day of admission BUN and creatinine are 114 and 5.94 respectively with baseline of 16 and 1.7. IMPROVED To 60/3.68 from 68/3.93 today after po 40 mg Lasix yesterday;  -  Discussed with Dr. Mercy Moore of Nephrology and stated ok to place him back on 40 mg of Lasix po BID.  - Nephrology consult by Cardiology and appreciated Recc's;  - Renal ultrasound showed No obstructive uropathy. Thinning of the bilateral renal parenchyma  with lobular contours,chronic and Right renal cysts. - D/C'd Tamsulosin 0.4 mg po Daily - SPEP unremarkable -Continue 40 mg of po Lasix BID and follow up with Nephrology.   Acute Decompensation of Chronic combined systolic and diastolic congestive heart failure with EF of 45-50% (now improved to 50-55%) and Grade 1 Diastolic Dysfunction - Patient endorsed 13 pound weight gain over the last 2 weeks. Improved weight loss today and yesterday. -CXR showed No acute abnormality noted, BNP was 188.5 - Heart Failure Team Signed Off.  - Repeat Echo per Cardiology Done and showed The cavity size was mildly dilated. Wall thickness was normal. Systolic function was normal. The estimatedejection fraction was in the range of 50% to 55%. There ishypokinesis of the apical myocardium. Doppler parameters are consistent with abnormal left ventricular relaxation (grade 1 diastolic dysfunction). -C/w po Lasix 40 mg po BID in discussion with Nephrology -Follow up with Cardiology as an Outpatient.   VT s/p ICD Shock on 05/08/16 - Patient with recent defibrillator event approximately one week ago. Followed by Dr. [name] in Cardiology. - C/w Amiodarone at 400 mg po BID per Cardiology x 1 week and switch to 200 mg of Amiodarone BID after; Carvedilol D/C'd by Dr. [name].  - 2-D done and showed the cavity size was mildly dilated. Wallthickness was normal. Systolic function was normal. The estimatedejection fraction was in the range of 50% to 55%. There ishypokinesis of the apical myocardium. Doppler parameters areconsistent with abnormal left ventricular relaxation (grade 1 diastolic dysfunction). -Needs follow up as Outpatient with Dr. [name].   Hx of CAD/STEMI s/p CABG - No current complaints concerning for ACS - Was onTelemetry - Continue Lipitor (wrote script), ASA, Imdur; D/C'd Carvedilol per Cards - Cardiology Follow Up.   Insomnia -Stated Clonazepam did not help -D/C'd Zolpidem 5 mg po qHSprn for  Sleep.  -Continue to monitor  Anxiety - Refilled Script for Clonazepam  1-2 mg po TIDprn for only #20 pills  Chronic pain: At baseline - Refilled Norco 10-325 mg po q6hprn for Pain for only #20 pills.   Discharge Instructions  Discharge Instructions    Call MD for:  difficulty breathing, headache or visual disturbances    Complete by:  As directed    Call MD for:  extreme fatigue    Complete by:  As directed    Call MD for:  persistant dizziness or light-headedness    Complete by:  As directed    Diet - low sodium heart healthy    Complete by:  As directed    Discharge instructions    Complete by:  As directed    Follow up Up with PCP Dr. Alm Bustard, Cardiology Dr. [name], and with Nephrology Dr. Mercy Moore in 1-2 Weeks. Take all medications as prescribed. If symptoms change or worsen please go to the ER for Evaluation.   Increase activity slowly    Complete by:  As directed        Medication List    STOP taking these medications   torsemide 20 MG tablet Commonly known as:  DEMADEX     TAKE these medications   allopurinol 100 MG tablet Commonly known as:  ZYLOPRIM Take 1 tablet (100 mg total) by mouth daily. Start taking on:  05/17/2016 What changed:  medication strength  how much to take   amiodarone 400 MG tablet Commonly known as:  PACERONE Take 1 tablet (400 mg total) by mouth 2 (two) times daily. What changed:  You were already taking a medication with the same name, and this prescription was added. Make sure you understand how and when to take each.   amiodarone 200 MG tablet Commonly known as:  PACERONE Take 1 tablet (200 mg total) by mouth 2 (two) times daily. Start taking on:  05/24/2016 What changed:  how much to take  how to take this  when to take this  additional instructions   aspirin EC 81 MG tablet Take 81 mg by mouth daily.   atorvastatin 20 MG tablet Commonly known as:  LIPITOR Take 1 tablet (20 mg total) by mouth daily.    clonazePAM 2 MG tablet Commonly known as:  KLONOPIN TAKE 1/2 TO 1 TABLETS BY MOUTH 3 TIMES A DAY AS NEEDED FOR ANXIETY   furosemide 40 MG tablet Commonly known as:  LASIX Take 1 tablet (40 mg total) by mouth 2 (two) times daily.   HYDROcodone-acetaminophen 10-325 MG tablet Commonly known as:  NORCO Take 1 tablet by mouth every 6 (six) hours as needed for moderate pain.   isosorbide mononitrate 30 MG 24 hr tablet Commonly known as:  IMDUR Take 1 tablet (30 mg total) by mouth daily.   loperamide 2 MG capsule Commonly known as:  IMODIUM Take 1-2 capsules (2-4 mg total) by mouth as needed for diarrhea or loose stools.      Follow-up Information    [facility]. Call on 05/18/2016.   Why:  [facility] will call you after discharge to schedule appointment. Pt can utilize the pharmacy onsite and medications range from $4.00 to $10.00.  Contact information: [place] [phone] [phone]       Virl Axe, MD Follow up in 1 week(s).   Specialty:  Cardiology Why:  Patient to call and schedule appointment Contact information: [address] [place] [address] [phone]        Windy Kalata, MD Follow up in 1 week(s).   Specialty:  Nephrology Why:  Patient to call to schedule appointment.  Contact information: [address] [place] [address] [phone]          Allergies  Allergen Reactions  . Eggs Or Egg-Derived Products Hives  . Codeine Nausea Only    Tolerates hydrocodone    Consultations:  Cardiology EP and Heart Failure Team  Nephrology  Procedures/Studies: Dg Chest 2 View  Result Date: 05/12/2016 CLINICAL DATA:  Renal failure and hypotension EXAM: CHEST  2 VIEW COMPARISON:  04/02/2016 FINDINGS: The cardiac shadow is stable. Defibrillator is again noted and stable. No focal infiltrate or sizable effusion is  seen. Some chronic changes are noted in the left lung base IMPRESSION: No acute abnormality noted. Electronically Signed   By: Inez Catalina M.D.   On: 05/12/2016 16:36   US Renal  Result Date: 05/13/2016 CLINICAL DATA:  Acute renal failure. EXAM: RENAL / URINARY TRACT ULTRASOUND COMPLETE COMPARISON:  CT 02/28/2014 FINDINGS: Right Kidney: Length: 9.5 cm. Renal contours are lobular. Cyst arising from the lower pole measures 4.1 x 4.5 x 5.8 cm. Smaller cyst in the upper kidney measures 1.5 cm. There is thinning of the renal parenchyma. Small stones on prior CT are not seen sonographically. Left Kidney:  Length: 11.6 cm. Renal contours are lobular and there is thinning of the renal parenchymal. Renal calcifications on prior CT are not seen sonographically. Bladder: Appears normal for degree of bladder distention. IMPRESSION: 1. No obstructive uropathy. 2. Thinning of the bilateral renal parenchyma with lobular contours, chronic. 3. Right renal cysts. Electronically Signed   By: Jeb Levering M.D.   On: 05/13/2016 01:42   ECHOCARDIOGRAM Study Conclusions  - Left ventricle: The cavity size was mildly dilated. Wall   thickness was normal. Systolic function was normal. The estimated   ejection fraction was in the range of 50% to 55%. There is   hypokinesis of the apical myocardium. Doppler parameters are   consistent with abnormal left ventricular relaxation (grade 1   diastolic dysfunction). - Left atrium: The atrium was mildly dilated. - Pulmonary arteries: Systolic pressure was mildly increased. PA   peak pressure: 43 mm Hg (S).  Impressions:  - Apical hypokinesis with overall low normal LV systolic function;   grade 1 diastolic dysfunction; mild LAE; mild TR with mildly   elevated pulmonary pressure.   Subjective: Patient was seen and examined at bedside this AM and he was ready to be D/C'd Home. Denied Cp/SOB/N/V/Abdominal Pain. Stated that he wanted to go home.   Discharge  Exam: Vitals:   05/16/16 0540 05/16/16 0812  BP: 116/76 119/87  Pulse: 90 98  Resp:    Temp: 98.2 F (36.8 C) 98.3 F (36.8 C)   Vitals:   05/15/16 2240 05/15/16 2328 05/16/16 0540 05/16/16 0812  BP: 137/90  116/76 119/87  Pulse:  81 90 98  Resp:      Temp: 98.8 F (37.1 C)  98.2 F (36.8 C) 98.3 F (36.8 C)  TempSrc: Oral  Oral Oral  SpO2:  96% 94% 94%  Weight:   81.4 kg (179 lb 8 oz)   Height:       General: Pt is alert, awake, not in acute distress Cardiovascular: RRR, S1/S2 +, no rubs, no gallops Respiratory: CTA bilaterally, no wheezing, no rhonchi Abdominal: Soft, NT, ND, bowel sounds + Extremities: Mild edema, no cyanosis  The results of significant diagnostics from this hospitalization (including imaging, microbiology, ancillary and laboratory) are listed below for reference.    Microbiology: No results found for this or any previous visit (from the past 240 hour(s)).   Labs: BNP (last 3 results)  Recent Labs  05/12/16 1707  BNP 188.5*  174.9*   Basic Metabolic Panel:  Recent Labs Lab 05/12/16 0834 05/12/16 1707 05/13/16 0411 05/14/16 0350 05/15/16 0313 05/16/16 0514  NA 138  --  141 144 143 142  K 4.6  --  4.5 4.3 3.8 3.9  CL 103  --  108 110 105 104  CO2 23  --  21* 25 25 25   GLUCOSE 118*  --  85 83 91 89  BUN 114*  --  111* 88* 68* 60*  CREATININE 5.94*  --  5.04* 4.35* 3.93* 3.68*  CALCIUM 9.0  --  8.2* 8.9 9.4 9.9  MG  --  2.1  --  2.0  --   --   PHOS  --  8.1*  --   --   --   --    Liver Function Tests:  Recent Labs Lab 05/13/16 0411  AST 12*  ALT 13*  ALKPHOS 79  BILITOT 0.6  PROT 5.0*  ALBUMIN 2.8*   No results for input(s): LIPASE, AMYLASE in the last 168 hours. No results for input(s):  AMMONIA in the last 168 hours. CBC:  Recent Labs Lab 05/12/16 0834 05/13/16 0411 05/14/16 0350  WBC 6.2 5.6 6.3  NEUTROABS 4.2  --  3.8  HGB 12.2* 10.9* 11.9*  HCT 38.4* 33.0* 36.1*  MCV 99.2 96.5 96.3  PLT 159 123* 160    Cardiac Enzymes:  Recent Labs Lab 05/12/16 1707  TROPONINI 0.04*   BNP: Invalid input(s): POCBNP CBG: No results for input(s): GLUCAP in the last 168 hours. D-Dimer No results for input(s): DDIMER in the last 72 hours. Hgb A1c No results for input(s): HGBA1C in the last 72 hours. Lipid Profile No results for input(s): CHOL, HDL, LDLCALC, TRIG, CHOLHDL, LDLDIRECT in the last 72 hours. Thyroid function studies No results for input(s): TSH, T4TOTAL, T3FREE, THYROIDAB in the last 72 hours.  Invalid input(s): FREET3 Anemia work up No results for input(s): VITAMINB12, FOLATE, FERRITIN, TIBC, IRON, RETICCTPCT in the last 72 hours. Urinalysis    Component Value Date/Time   COLORURINE YELLOW 05/13/2016 0024   APPEARANCEUR CLEAR 05/13/2016 0024   LABSPEC 1.009 05/13/2016 0024   PHURINE 6.0 05/13/2016 0024   GLUCOSEU NEGATIVE 05/13/2016 0024   HGBUR NEGATIVE 05/13/2016 0024   BILIRUBINUR NEGATIVE 05/13/2016 0024   KETONESUR NEGATIVE 05/13/2016 0024   PROTEINUR NEGATIVE 05/13/2016 0024   UROBILINOGEN 0.2 02/27/2014 1824   NITRITE NEGATIVE 05/13/2016 0024   LEUKOCYTESUR NEGATIVE 05/13/2016 0024   Time coordinating discharge: Over 30 minutes  SIGNED:  Kerney Elbe, DO [facility] 05/16/2016, 10:02 PM Pager [phone]  If 7PM-7AM, please contact night-coverage [email or web address] Password [email or web address]

## 2016-05-18 ENCOUNTER — Inpatient Hospital Stay (HOSPITAL_COMMUNITY)
Admission: EM | Admit: 2016-05-18 | Discharge: 2016-05-21 | DRG: 682 | Disposition: A | Discharge status: Home / Self Care | Payer: Medicaid Other | Attending: Pulmonary Disease | Admitting: Pulmonary Disease

## 2016-05-18 ENCOUNTER — Emergency Department (HOSPITAL_COMMUNITY): Payer: Medicaid Other

## 2016-05-18 ENCOUNTER — Encounter (HOSPITAL_COMMUNITY): Payer: Self-pay | Admitting: Emergency Medicine

## 2016-05-18 DIAGNOSIS — Z951 Presence of aortocoronary bypass graft: Secondary | ICD-10-CM

## 2016-05-18 DIAGNOSIS — I130 Hypertensive heart and chronic kidney disease with heart failure and stage 1 through stage 4 chronic kidney disease, or unspecified chronic kidney disease: Secondary | ICD-10-CM | POA: Diagnosis present

## 2016-05-18 DIAGNOSIS — G4733 Obstructive sleep apnea (adult) (pediatric): Secondary | ICD-10-CM | POA: Diagnosis present

## 2016-05-18 DIAGNOSIS — F1123 Opioid dependence with withdrawal: Secondary | ICD-10-CM | POA: Diagnosis present

## 2016-05-18 DIAGNOSIS — G4700 Insomnia, unspecified: Secondary | ICD-10-CM | POA: Diagnosis present

## 2016-05-18 DIAGNOSIS — Z95810 Presence of automatic (implantable) cardiac defibrillator: Secondary | ICD-10-CM

## 2016-05-18 DIAGNOSIS — E785 Hyperlipidemia, unspecified: Secondary | ICD-10-CM | POA: Diagnosis present

## 2016-05-18 DIAGNOSIS — I252 Old myocardial infarction: Secondary | ICD-10-CM

## 2016-05-18 DIAGNOSIS — Z7982 Long term (current) use of aspirin: Secondary | ICD-10-CM | POA: Diagnosis not present

## 2016-05-18 DIAGNOSIS — N184 Chronic kidney disease, stage 4 (severe): Secondary | ICD-10-CM | POA: Diagnosis present

## 2016-05-18 DIAGNOSIS — N179 Acute kidney failure, unspecified: Secondary | ICD-10-CM | POA: Diagnosis not present

## 2016-05-18 DIAGNOSIS — I959 Hypotension, unspecified: Secondary | ICD-10-CM

## 2016-05-18 DIAGNOSIS — F419 Anxiety disorder, unspecified: Secondary | ICD-10-CM | POA: Diagnosis present

## 2016-05-18 DIAGNOSIS — I5042 Chronic combined systolic (congestive) and diastolic (congestive) heart failure: Secondary | ICD-10-CM | POA: Diagnosis present

## 2016-05-18 DIAGNOSIS — R571 Hypovolemic shock: Secondary | ICD-10-CM | POA: Diagnosis present

## 2016-05-18 DIAGNOSIS — R0600 Dyspnea, unspecified: Secondary | ICD-10-CM

## 2016-05-18 DIAGNOSIS — N189 Chronic kidney disease, unspecified: Secondary | ICD-10-CM | POA: Diagnosis present

## 2016-05-18 DIAGNOSIS — Z79899 Other long term (current) drug therapy: Secondary | ICD-10-CM

## 2016-05-18 DIAGNOSIS — Z6827 Body mass index (BMI) 27.0-27.9, adult: Secondary | ICD-10-CM | POA: Diagnosis not present

## 2016-05-18 DIAGNOSIS — I255 Ischemic cardiomyopathy: Secondary | ICD-10-CM | POA: Diagnosis present

## 2016-05-18 DIAGNOSIS — G894 Chronic pain syndrome: Secondary | ICD-10-CM | POA: Insufficient documentation

## 2016-05-18 DIAGNOSIS — G8929 Other chronic pain: Secondary | ICD-10-CM | POA: Diagnosis present

## 2016-05-18 LAB — CBC WITH DIFFERENTIAL/PLATELET
BASOS PCT: 0 %
Basophils Absolute: 0 10*3/uL (ref 0.0–0.1)
EOS ABS: 0.4 10*3/uL (ref 0.0–0.7)
EOS PCT: 4 %
HCT: 41.5 % (ref 39.0–52.0)
HEMOGLOBIN: 13.4 g/dL (ref 13.0–17.0)
Lymphocytes Relative: 20 %
Lymphs Abs: 2.1 10*3/uL (ref 0.7–4.0)
MCH: 31.8 pg (ref 26.0–34.0)
MCHC: 32.3 g/dL (ref 30.0–36.0)
MCV: 98.3 fL (ref 78.0–100.0)
MONO ABS: 0.6 10*3/uL (ref 0.1–1.0)
Monocytes Relative: 6 %
NEUTROS ABS: 7.4 10*3/uL (ref 1.7–7.7)
Neutrophils Relative %: 70 %
PLATELETS: 194 10*3/uL (ref 150–400)
RBC: 4.22 MIL/uL (ref 4.22–5.81)
RDW: 13.9 % (ref 11.5–15.5)
WBC: 10.5 10*3/uL (ref 4.0–10.5)

## 2016-05-18 LAB — BASIC METABOLIC PANEL
Anion gap: 12 (ref 5–15)
Anion gap: 10 (ref 5–15)
BUN: 67 mg/dL — ABNORMAL HIGH (ref 6–20)
BUN: 64 mg/dL — ABNORMAL HIGH (ref 6–20)
CALCIUM: 8.2 mg/dL — AB (ref 8.9–10.3)
CHLORIDE: 109 mmol/L (ref 101–111)
CO2: 20 mmol/L — AB (ref 22–32)
CO2: 21 mmol/L — AB (ref 22–32)
CREATININE: 6.42 mg/dL — AB (ref 0.61–1.24)
CREATININE: 5.89 mg/dL — AB (ref 0.61–1.24)
Calcium: 8.2 mg/dL — ABNORMAL LOW (ref 8.9–10.3)
Chloride: 109 mmol/L (ref 101–111)
GFR calc Af Amer: 10 mL/min — ABNORMAL LOW (ref >60)
GFR calc Af Amer: 11 mL/min — ABNORMAL LOW (ref >60)
GFR calc non Af Amer: 8 mL/min — ABNORMAL LOW (ref >60)
GFR calc non Af Amer: 9 mL/min — ABNORMAL LOW (ref >60)
GLUCOSE: 73 mg/dL (ref 65–99)
GLUCOSE: 98 mg/dL (ref 65–99)
Potassium: 4.7 mmol/L (ref 3.5–5.1)
Potassium: 4.4 mmol/L (ref 3.5–5.1)
Sodium: 141 mmol/L (ref 135–145)
Sodium: 140 mmol/L (ref 135–145)

## 2016-05-18 LAB — COMPREHENSIVE METABOLIC PANEL
ALK PHOS: 75 U/L (ref 38–126)
ALT: 16 U/L — ABNORMAL LOW (ref 17–63)
ANION GAP: 13 (ref 5–15)
AST: 13 U/L — ABNORMAL LOW (ref 15–41)
Albumin: 3.5 g/dL (ref 3.5–5.0)
BUN: 79 mg/dL — ABNORMAL HIGH (ref 6–20)
CALCIUM: 8.7 mg/dL — AB (ref 8.9–10.3)
CO2: 23 mmol/L (ref 22–32)
CREATININE: 8.1 mg/dL — AB (ref 0.61–1.24)
Chloride: 103 mmol/L (ref 101–111)
GFR, EST AFRICAN AMERICAN: 7 mL/min — AB (ref >60)
GFR, EST NON AFRICAN AMERICAN: 6 mL/min — AB (ref >60)
Glucose, Bld: 90 mg/dL (ref 65–99)
Potassium: 4.2 mmol/L (ref 3.5–5.1)
SODIUM: 139 mmol/L (ref 135–145)
TOTAL PROTEIN: 6.3 g/dL — AB (ref 6.5–8.1)
Total Bilirubin: 0.6 mg/dL (ref 0.3–1.2)

## 2016-05-18 LAB — I-STAT CG4 LACTIC ACID, ED
LACTIC ACID, VENOUS: 0.9 mmol/L (ref 0.5–1.9)
Lactic Acid, Venous: 1.4 mmol/L (ref 0.5–1.9)

## 2016-05-18 LAB — URINALYSIS, ROUTINE W REFLEX MICROSCOPIC
BILIRUBIN URINE: NEGATIVE
Glucose, UA: NEGATIVE mg/dL
HGB URINE DIPSTICK: NEGATIVE
KETONES UR: NEGATIVE mg/dL
Leukocytes, UA: NEGATIVE
NITRITE: NEGATIVE
PROTEIN: NEGATIVE mg/dL
SPECIFIC GRAVITY, URINE: 1.013 (ref 1.005–1.030)
pH: 5.5 (ref 5.0–8.0)

## 2016-05-18 LAB — RAPID URINE DRUG SCREEN, HOSP PERFORMED
Amphetamines: NOT DETECTED
BENZODIAZEPINES: POSITIVE — AB
Barbiturates: NOT DETECTED
COCAINE: NOT DETECTED
Opiates: POSITIVE — AB
Tetrahydrocannabinol: NOT DETECTED

## 2016-05-18 LAB — PROTIME-INR
INR: 1
Prothrombin Time: 13.2 seconds (ref 11.4–15.2)

## 2016-05-18 LAB — BRAIN NATRIURETIC PEPTIDE: B Natriuretic Peptide: 39.1 pg/mL (ref 0.0–100.0)

## 2016-05-18 LAB — CORTISOL: Cortisol, Plasma: 7.7 ug/dL

## 2016-05-18 LAB — I-STAT TROPONIN, ED
TROPONIN I, POC: 0.02 ng/mL (ref 0.00–0.08)
TROPONIN I, POC: 0 ng/mL (ref 0.00–0.08)

## 2016-05-18 LAB — MRSA PCR SCREENING: MRSA BY PCR: NEGATIVE

## 2016-05-18 LAB — TROPONIN I: Troponin I: <0.03 ng/mL (ref <0.03)

## 2016-05-18 LAB — MAGNESIUM: Magnesium: 1.6 mg/dL — ABNORMAL LOW (ref 1.7–2.4)

## 2016-05-18 LAB — ETHANOL

## 2016-05-18 MED ORDER — DEXTROSE 5 % IV SOLN
30.0000 ug/min | INTRAVENOUS | Status: DC
  Administered 2016-05-18: 40 ug/min via INTRAVENOUS
  Administered 2016-05-18: 30 ug/min via INTRAVENOUS
  Administered 2016-05-18: 40 ug/min via INTRAVENOUS
  Administered 2016-05-19: 60 ug/min via INTRAVENOUS
  Administered 2016-05-19: 55 ug/min via INTRAVENOUS
  Filled 2016-05-18 (×5): qty 1

## 2016-05-18 MED ORDER — HEPARIN SODIUM (PORCINE) 5000 UNIT/ML IJ SOLN
5000.0000 [IU] | Freq: Three times a day (TID) | INTRAMUSCULAR | Status: DC
  Administered 2016-05-18 – 2016-05-21 (×9): 5000 [IU] via SUBCUTANEOUS
  Filled 2016-05-18 (×9): qty 1

## 2016-05-18 MED ORDER — SODIUM CHLORIDE 0.9 % IV SOLN
INTRAVENOUS | Status: DC
  Administered 2016-05-18: 15:00:00 via INTRAVENOUS
  Administered 2016-05-19: 1000 mL via INTRAVENOUS

## 2016-05-18 MED ORDER — AMIODARONE HCL 200 MG PO TABS
200.0000 mg | ORAL_TABLET | Freq: Two times a day (BID) | ORAL | Status: DC
  Administered 2016-05-18 – 2016-05-21 (×5): 200 mg via ORAL
  Filled 2016-05-18 (×6): qty 1

## 2016-05-18 MED ORDER — TRAZODONE HCL 50 MG PO TABS
100.0000 mg | ORAL_TABLET | Freq: Every evening | ORAL | Status: DC | PRN
  Administered 2016-05-19 – 2016-05-21 (×3): 100 mg via ORAL
  Filled 2016-05-18 (×3): qty 2

## 2016-05-18 MED ORDER — ASPIRIN EC 81 MG PO TBEC
81.0000 mg | DELAYED_RELEASE_TABLET | Freq: Every day | ORAL | Status: DC
  Administered 2016-05-18 – 2016-05-21 (×4): 81 mg via ORAL
  Filled 2016-05-18 (×4): qty 1

## 2016-05-18 MED ORDER — SODIUM CHLORIDE 0.9 % IV BOLUS (SEPSIS)
1000.0000 mL | Freq: Once | INTRAVENOUS | Status: AC
  Administered 2016-05-18: 1000 mL via INTRAVENOUS

## 2016-05-18 MED ORDER — SODIUM CHLORIDE 0.9 % IV SOLN: 250.0000 mL | INTRAVENOUS | Status: DC | PRN

## 2016-05-18 MED ORDER — ONDANSETRON HCL 4 MG/2ML IJ SOLN
4.0000 mg | Freq: Once | INTRAMUSCULAR | Status: AC
  Administered 2016-05-18: 4 mg via INTRAVENOUS
  Filled 2016-05-18: qty 2

## 2016-05-18 MED ORDER — HYDROCODONE-ACETAMINOPHEN 5-325 MG PO TABS
1.0000 | ORAL_TABLET | Freq: Once | ORAL | Status: DC
  Filled 2016-05-18: qty 1

## 2016-05-18 MED ORDER — MAGNESIUM SULFATE 2 GM/50ML IV SOLN
2.0000 g | Freq: Once | INTRAVENOUS | Status: AC
  Administered 2016-05-18: 2 g via INTRAVENOUS
  Filled 2016-05-18: qty 50

## 2016-05-18 MED ORDER — CLONAZEPAM 0.5 MG PO TABS
0.5000 mg | ORAL_TABLET | Freq: Once | ORAL | Status: AC
  Administered 2016-05-18: 0.5 mg via ORAL
  Filled 2016-05-18: qty 1
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
## 2016-05-18 NOTE — ED Triage Notes (Signed)
Pt here from home with c/o hypotension and sob. Pt was just released from hospital this past sat , pt /co dizziness and weakness

## 2016-05-18 NOTE — Progress Notes (Signed)
[facility] Progress Note Patient Name: Richard Cook DOB: Aug 11, 1955 MRN: [id number]   Date of Service  05/18/2016  HPI/Events of Note  Patient reporting significant anxiety and takes Klonopin chronically at home at bedtime.   eICU Interventions  Klonopin 0.5 mg by mouth 1 ordered      Intervention Category Minor Interventions: Routine modifications to care plan (e.g. PRN medications for pain, fever)  Tera Partridge 05/18/2016, 9:33 PM

## 2016-05-18 NOTE — Progress Notes (Signed)
[facility] Progress Note Patient Name: Richard Cook DOB: Mar 20, 1956 MRN: [id number]   Date of Service  05/18/2016  HPI/Events of Note  insomnia  eICU Interventions  trazodone     Intervention Category Minor Interventions: Routine modifications to care plan (e.g. PRN medications for pain, fever)  Simonne Maffucci 05/18/2016, 11:38 PM

## 2016-05-18 NOTE — ED Notes (Signed)
Consulting PA wanted another Liter of fluid I went in and hung the fluid
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
## 2016-05-18 NOTE — Care Management Note (Signed)
Case Management Note  Patient Details  Name: Reco Shonk MRN: [id number] Date of Birth: [birth year]  Subjective/Objective:   Adm w hypotension                  Action/Plan:lives at home.    Expected Discharge Date:                  Expected Discharge Plan:  Home/Self Care  In-House Referral:     Discharge [facility]  Post Acute Care Choice:    Choice offered to:     DME Arranged:    DME Agency:     HH Arranged:    [place] Agency:     Status of Service:  In process, will continue to follow  If discussed at Long Length of Stay Meetings, dates discussed:    Additional Comments:pt's pcp is dr Aldean Jewett. Last adm cm working on getting pt into transitional care program [facility] and if get in there can use their pharmacy. Financial co saw pt last adm. Will cont to follow.  Lacretia Leigh, RN 05/18/2016, 3:07 PM

## 2016-05-19 LAB — CBC
HEMATOCRIT: 38 % — AB (ref 39.0–52.0)
Hemoglobin: 12.3 g/dL — ABNORMAL LOW (ref 13.0–17.0)
MCH: 31.9 pg (ref 26.0–34.0)
MCHC: 32.4 g/dL (ref 30.0–36.0)
MCV: 98.7 fL (ref 78.0–100.0)
PLATELETS: 216 10*3/uL (ref 150–400)
RBC: 3.85 MIL/uL — ABNORMAL LOW (ref 4.22–5.81)
RDW: 13.6 % (ref 11.5–15.5)
WBC: 8.4 10*3/uL (ref 4.0–10.5)

## 2016-05-19 LAB — BASIC METABOLIC PANEL
Anion gap: 8 (ref 5–15)
BUN: 58 mg/dL — ABNORMAL HIGH (ref 6–20)
CALCIUM: 8.3 mg/dL — AB (ref 8.9–10.3)
CHLORIDE: 112 mmol/L — AB (ref 101–111)
CO2: 23 mmol/L (ref 22–32)
CREATININE: 5.21 mg/dL — AB (ref 0.61–1.24)
GFR, EST AFRICAN AMERICAN: 13 mL/min — AB (ref >60)
GFR, EST NON AFRICAN AMERICAN: 11 mL/min — AB (ref >60)
Glucose, Bld: 87 mg/dL (ref 65–99)
Potassium: 4.2 mmol/L (ref 3.5–5.1)
SODIUM: 143 mmol/L (ref 135–145)

## 2016-05-19 LAB — CALCIUM, IONIZED: CALCIUM, IONIZED, SERUM: 4 mg/dL — AB (ref 4.5–5.6)

## 2016-05-19 LAB — MAGNESIUM: Magnesium: 2.2 mg/dL (ref 1.7–2.4)

## 2016-05-19 LAB — PHOSPHORUS: PHOSPHORUS: 5.6 mg/dL — AB (ref 2.5–4.6)

## 2016-05-19 MED ORDER — HYDROCODONE-ACETAMINOPHEN 5-325 MG PO TABS
1.0000 | ORAL_TABLET | Freq: Four times a day (QID) | ORAL | Status: DC | PRN
  Administered 2016-05-19 – 2016-05-20 (×4): 1 via ORAL
  Filled 2016-05-19 (×4): qty 1

## 2016-05-19 MED ORDER — PHENYLEPHRINE HCL 10 MG/ML IJ SOLN
30.0000 ug/min | INTRAVENOUS | Status: DC
  Administered 2016-05-19: 70 ug/min via INTRAVENOUS
  Administered 2016-05-19: 40 ug/min via INTRAVENOUS
  Filled 2016-05-19 (×2): qty 4

## 2016-05-19 MED ORDER — CLONAZEPAM 0.5 MG PO TABS
0.5000 mg | ORAL_TABLET | Freq: Two times a day (BID) | ORAL | Status: DC | PRN
  Administered 2016-05-19 – 2016-05-20 (×4): 0.5 mg via ORAL
  Filled 2016-05-19 (×4): qty 1

## 2016-05-19 NOTE — Progress Notes (Signed)
PULMONARY / CRITICAL CARE MEDICINE   Name: Richard Cook MRN: [id number] DOB: 1956/07/05    ADMISSION DATE:  05/18/2016 CONSULTATION DATE:  05/18/16  REFERRING MD:  Tegeler - EDP  CHIEF COMPLAINT:  Hypotension  HISTORY OF PRESENT ILLNESS:  Richard Cook is a 60 y.o. male with PMH as outlined below. He presented to [facility] ED 10/16 with lightheadedness and hypotension.  He was just admitted to [facility] 10/101/7 through 05/17/15 for AoCKD and acute decompensation of chronic combined heart failure.  Since discharge, he has had decreased PO intake due to no appetite and nausea.  Has had very minimal fluid intake as well.  Has also had oliguria with "just a few drops here and there".  On 10/15, he continued to take his PO meds (including furosemide, imdur) and later when he checked his BP, he noted it to be 61'Y systolic.  On 10/16, he did not take any of his meds.  He continued to feel lightheaded; therefore, decided to call EMS.    Upon EMS arrival, he was found to have BP of 62/40 manually.  He was brought to ED where he remained hypotensive despite 3L IVF.  SCr was noted to be 8.1 (was 3.68 on discharge 2 days prior), Mg 1.6.  Remaining lab work essentially negative.  Due to persistent hypotension, PCCM was asked to admit.  Of note, during his recent admission, SBP was noted to remain in the 100-110 range.    SUBJECTIVE:  Feels better over all. Has chronic anxiety and pain.  Denies SOB/cough/abd pain.  Still on neosynephrine.   VITAL SIGNS: BP 101/64 (BP Location: Left Arm)   Pulse (!) 47   Temp 97.7 F (36.5 C) (Oral)   Resp 12   Ht 5\' 10"  (1.778 m)   Wt 87.2 kg (192 lb 3.9 oz)   SpO2 94%   BMI 27.58 kg/m   HEMODYNAMICS:    VENTILATOR SETTINGS:    INTAKE / OUTPUT: I/O last 3 completed shifts: In: 6975.6 [P.O.:120; I.V.:3805.6; IV Piggyback:3050] Out: 2370 [Urine:2370]   PHYSICAL EXAMINATION: General: Middle aged male, in NAD. Neuro: A&O x 3, non-focal.   HEENT: [place]/AT. PERRL, sclerae anicteric. Cardiovascular: RRR, no M/R/G.  Lungs: Respirations even and unlabored.  CTA bilaterally, No W/R/R. Abdomen: BS x 4, soft, NT/ND.  Musculoskeletal: No gross deformities, no edema.  Skin: Intact, warm, no rashes.  LABS:  BMET  Recent Labs Lab 05/18/16 1820 05/18/16 2216 05/19/16 0235  NA 141 140 143  K 4.7 4.4 4.2  CL 109 109 112*  CO2 20* 21* 23  BUN 67* 64* 58*  CREATININE 6.42* 5.89* 5.21*  GLUCOSE 73 98 87    Electrolytes  Recent Labs Lab 05/12/16 1707  05/14/16 0350  05/18/16 0923 05/18/16 1820 05/18/16 2216 05/19/16 0235  CALCIUM  --   < > 8.9  < > 8.7* 8.2* 8.2* 8.3*  MG 2.1  --  2.0  --  1.6*  --   --  2.2  PHOS 8.1*  --   --   --   --   --   --  5.6*  < > = values in this interval not displayed.  CBC  Recent Labs Lab 05/14/16 0350 05/18/16 0923 05/19/16 0235  WBC 6.3 10.5 8.4  HGB 11.9* 13.4 12.3*  HCT 36.1* 41.5 38.0*  PLT 160 194 216    Coag's  Recent Labs Lab 05/18/16 0923  INR 1.00    Sepsis Markers  Recent Labs Lab 05/18/16 0936  05/18/16 1315  LATICACIDVEN 0.90 1.40    ABG No results for input(s): PHART, PCO2ART, PO2ART in the last 168 hours.  Liver Enzymes  Recent Labs Lab 05/13/16 0411 05/18/16 0923  AST 12* 13*  ALT 13* 16*  ALKPHOS 79 75  BILITOT 0.6 0.6  ALBUMIN 2.8* 3.5    Cardiac Enzymes  Recent Labs Lab 05/12/16 1707 05/18/16 1820  TROPONINI 0.04* <0.03    Glucose No results for input(s): GLUCAP in the last 168 hours.  Imaging Dg Chest Portable 1 View  Result Date: 05/18/2016 CLINICAL DATA:  hypotension, shortness of breath this am,as a lower hernia , on his way to [facility] for hernia repair. Felt sick EXAM: PORTABLE CHEST 1 VIEW COMPARISON:  05/12/2016 FINDINGS: Defibrillator noted, lead position unchanged. Prior CABG. Mild linear scarring at the left lung base. The lungs appear otherwise clear. Heart size within normal limits for projection. Mild  thoracic spondylosis. IMPRESSION: 1.  No active cardiopulmonary disease is radiographically apparent. 2. Prior CABG.  Defibrillator lead position unchanged. 3. Mild scarring at the left lung base. Electronically Signed   By: Van Clines M.D.   On: 05/18/2016 09:27     STUDIES:  CXR 10/16 > no acute process.  CULTURES: None.  ANTIBIOTICS: None.  SIGNIFICANT EVENTS: 05/12/16 - 05/16/16 > admitted with AoCKD, decompensated heart failure. 10/16 > re-admitted with hypotension, AoCKD.  LINES/TUBES: None.  DISCUSSION: 60 y.o. male with hx CKD and combined heart failure along with borderline chronic hypotension (SBP 100 range), admitted 10/16 with worsening renal function, hypotension with BP in 70's despite 3L IVF.  Presumed all due to hypovolemia / dehydration.  ASSESSMENT / PLAN:  CARDIOVASCULAR A:  Hypotension - presumed due to hypovolemia / dehydration.   Of note, has borderline chronic hypotension with SBP ranging in 100-110 range.  Chronic combined heart failure - Echo from 05/14/16 with EF 50-55%, G1DD. Hx VT - started on PO amio on recent admission. Hx CAD / STEMI s/p CABG, HTN, HLD. P:  BP is better, baseline BP 629 systolic. Try to wean off neosynephrine.  Continue preadmission ASA. Hold preadmission furosemide, imdur, atorvastatin.  RENAL A:   AoCKD - baseline Creat on discharge last time was close to 4 CKD st 4 at baseline P:   NS @ 50 mls/hr from 100 mls hr. Pt eating well and making urine.   May need nephrology consult.  GASTROINTESTINAL A:   Nutrition. P:   Renal healthy diet.  HEMATOLOGIC A:   VTE Prophylaxis. P: SCD's / Heparin. CBC in AM.  NEUROLOGIC A:   Hx anxiety, insomnia, chronic pain. P:   Pt takes norco 10/325 at least daily x 10 yrs. Will decrease dose to 5/325 2/2 AKI q6 prn Pt takes clonazepam 2 mg BID. Will decrease dose to 0.5 BID prn. Cont trazodone for insomnia.   PULMONARY A: OSA per report - does not use CPAP. P:    Observe for now.   INFECTIOUS A:   No indication of infection. P:   Monitor clinically. (-) fever, WBC.   ENDOCRINE A:   No acute issues.   P:   No interventions required.   Family updated: None available.  Interdisciplinary Family Meeting v Palliative Care Meeting:  Due by: 05/25/16.  Critical care time with this patient today : 30 minutes.    Monica Becton, MD 05/19/2016, 8:43 AM [place] [facility] Pager [phone] After 3 pm or if no answer, call [phone]

## 2016-05-19 NOTE — Hospital Discharge Follow-Up (Signed)
Met with the patient to discuss follow up at the [facility].  Reviewed the services provided at the [facility] including social work, pharmacy assistance and financial counseling.  He stated that he is interested in having his prescriptions filled at [place]. Explained how the pharmacists will work with him to completed applications for the drug assistance programs.   Explained that the Financial Counselor can assist him with submitting an application for the [employer] and [employer].  He said that he has been living at the [facility] for over 5 years and the cost is $750/month. He noted that he is not able to work at this time and has not worked since July 2017. He stated that he has been denied disability and will need to work on an appeal.  He was agreeable to scheduling an appointment at the [facility] and an appointment was scheduled for 05/26/16 @ 1045 and the information was placed on the AVS.  He said that he has a car and would usually drive to his appointments but he is he not cleared to drive, [place] can provide transportation for him to the clinic.    Update provided to Elissa Hefty, RN CM

## 2016-05-19 NOTE — Discharge Summary (Signed)
Physician Discharge Summary  Richard Cook ZHG:[id number] DOB: Sep 20, 1955 DOA: 05/12/2016  PCP: Lelon Huh, MD  Admit date: 05/12/2016 Discharge date: 05/19/2016  Admitted From: Home Disposition:  Home  Recommendations for Outpatient Follow-up:  1. Follow up with PCP in 1-2 weeks 2. Follow up with Dr. Mercy Moore in Nephrology in 1 week 3. Follow up with Dr. [name] in [facility] in 1 week 4. Please obtain BMP/CBC in one week   Home Health: No Equipment/Devices: None  Discharge Condition: Stable and Improved. CODE STATUS: FULL Diet recommendation: Heart Healthy  Brief/Interim Summary: Richard Richard Edwardsis a 60 y.o.malewith medical history significant of anxiety, chronic back pain, CAD/ST EMI/CABG, CHF with 35% EF, CKD stage III, DVT, HLD, HTN, gout, nocturnal hypoxemia, rheumatoid arthritis, SVT, residing to [facility] for admission after having labs drawn at his cardiologist's office showing acute on chronic renal failure. Of note patient was discharged from [facility] on 04/04/2016 after 4 day admission for sepsis secondary to left lower extremity cellulitis. Patient has completed his antibiotics and has no further symptoms at this time other than a sign lower extremity swelling. Patient states that after discharge from the hospital he felt that his baseline health. Approximately 1 week ago patient developed gradual onset nausea and dizziness. When patient went to lie down due to his symptoms he also noticed that his heart was beating very fast and had a sensation of palpitations. Denied any associated chest pain or shortness of breath. Patient states that after resting for a minute or 2 his defibrillator went off. After recovering from the shock patient so that his heartbeat had slowed significantly and a check of his vital signs showed a blood pressure of 80/50. Since that time blood pressures have remained in the 19-62 systolic range. Patient states his  typical blood pressures around 229 systolic. Since defibrillating episode patient states he is felt well again until the morning of 05/12/2016 when he developed nausea again. Patient went to his cardiologist's office where labs were drawn showing acute renal failure. He was admitted for Acute Renal Failure on CKD III and Acute Decompensation of Systolic CHF with EF of 79-89%. He was treated and cautiously diuresed with IV Lasix because of his Bp, but he responded well and  transitioned back to Home dose of 40 mg po BID of Lasix. He was evaluated by the Heart Failure Team, EP Cardiology, and by Nephrology. He was deemed medically stable for Discharge and was told to follow up with the [facility] for medication affordability. After Patient was D/C'd he called several times for a Refill for his Norco and Klonopin. He stated when he got home he realized he did not have any. Patient was written Scripts for #20 pills of Klonopin and #20 pills of Norco and told to follow up with Primary Care for refills.   Discharge Diagnoses:  Active Problems:   Cardiomyopathy, ischemic   VT (ventricular tachycardia) (HCC)   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, stage 5 (HCC)   Renal failure   Acute on chronic combined systolic and diastolic congestive heart failure (HCC)   CHF (congestive heart failure) (HCC)   Acute on chronic systolic CHF (congestive heart failure) ([place])  AKI on CKD Stage 3 likely from Hypotension in the setting of Hypotension and prolonged VT. -On day of admission BUN and creatinine are 114 and 5.94 respectively with baseline of 16 and 1.7. IMPROVED To 60/3.68 from 68/3.93 today after po 40 mg Lasix  yesterday;  -Discussed with Dr. Mercy Moore of Nephrology and stated ok to place him back on 40 mg of Lasix po BID.  - Nephrology consult by Cardiology and appreciated Recc's;  - Renal ultrasound showed No obstructive uropathy. Thinning of the bilateral renal  parenchyma with lobular contours,chronic and Right renal cysts. - D/C'd Tamsulosin 0.4 mg po Daily - SPEP unremarkable -Continue 40 mg of po Lasix BID and follow up with Nephrology.   Acute Decompensation of Chronic combined systolic and diastolic congestive heart failure with EF of 45-50% (now improved to 50-55%) and Grade 1 Diastolic Dysfunction - Patient endorsed 13 pound weight gain over the last 2 weeks. Improved weight loss today and yesterday. -CXR showed No acute abnormality noted, BNP was 188.5 - Heart Failure Team Signed Off.  - Repeat Echo per Cardiology Done and showed The cavity size was mildly dilated. Wall thickness was normal. Systolic function was normal. The estimatedejection fraction was in the range of 50% to 55%. There ishypokinesis of the apical myocardium. Doppler parameters are consistent with abnormal left ventricular relaxation (grade 1 diastolic dysfunction). -C/w po Lasix 40 mg po BID in discussion with Nephrology -Follow up with Cardiology as an Outpatient.   VT s/p ICD Shock on 05/08/16 - Patient with recent defibrillator event approximately one week ago. Followed by Dr. [name] in Cardiology. - C/w Amiodarone at 400 mg po BID per Cardiology x 1 week and switch to 200 mg of Amiodarone BID after; Carvedilol D/C'd by Dr. [name].  - 2-D done and showed the cavity size was mildly dilated. Wallthickness was normal. Systolic function was normal. The estimatedejection fraction was in the range of 50% to 55%. There ishypokinesis of the apical myocardium. Doppler parameters areconsistent with abnormal left ventricular relaxation (grade 1 diastolic dysfunction). -Needs follow up as Outpatient with Dr. [name].   Hx of CAD/STEMI s/p CABG - No current complaints concerning for ACS - Was onTelemetry - Continue Lipitor (wrote script), ASA, Imdur; D/C'd Carvedilol per Cards - Cardiology Follow Up.   Insomnia -Stated Clonazepam did not help -D/C'd Zolpidem 5 mg po  qHSprn for Sleep.  -Continue to monitor  Anxiety - Refilled Script for Clonazepam  1-2 mg po TIDprn for only #20 pills  Chronic pain: At baseline - Refilled Norco 10-325 mg po q6hprn for Pain for only #20 pills.   Discharge Instructions  Discharge Instructions    Call MD for:  difficulty breathing, headache or visual disturbances    Complete by:  As directed    Call MD for:  extreme fatigue    Complete by:  As directed    Call MD for:  persistant dizziness or light-headedness    Complete by:  As directed    Diet - low sodium heart healthy    Complete by:  As directed    Discharge instructions    Complete by:  As directed    Follow up Up with PCP Dr. Alm Bustard, Cardiology Dr. [name], and with Nephrology Dr. Mercy Moore in 1-2 Weeks. Take all medications as prescribed. If symptoms change or worsen please go to the ER for Evaluation.   Increase activity slowly    Complete by:  As directed        Medication List    STOP taking these medications   torsemide 20 MG tablet Commonly known as:  DEMADEX     TAKE these medications   allopurinol 100 MG tablet Commonly known as:  ZYLOPRIM Take 1 tablet (100 mg total) by mouth daily. What changed:  medication strength  how much to take   amiodarone 400 MG tablet Commonly known as:  PACERONE Take 1 tablet (400 mg total) by mouth 2 (two) times daily. What changed:  You were already taking a medication with the same name, and this prescription was added. Make sure you understand how and when to take each.   amiodarone 200 MG tablet Commonly known as:  PACERONE Take 1 tablet (200 mg total) by mouth 2 (two) times daily. Start taking on:  05/24/2016 What changed:  how much to take  how to take this  when to take this  additional instructions   aspirin EC 81 MG tablet Take 81 mg by mouth daily.   atorvastatin 20 MG tablet Commonly known as:  LIPITOR Take 1 tablet (20 mg total) by mouth daily.   clonazePAM 2 MG  tablet Commonly known as:  KLONOPIN TAKE 1/2 TO 1 TABLETS BY MOUTH 3 TIMES A DAY AS NEEDED FOR ANXIETY   furosemide 40 MG tablet Commonly known as:  LASIX Take 1 tablet (40 mg total) by mouth 2 (two) times daily.   HYDROcodone-acetaminophen 10-325 MG tablet Commonly known as:  NORCO Take 1 tablet by mouth every 6 (six) hours as needed for moderate pain.   isosorbide mononitrate 30 MG 24 hr tablet Commonly known as:  IMDUR Take 1 tablet (30 mg total) by mouth daily.   loperamide 2 MG capsule Commonly known as:  IMODIUM Take 1-2 capsules (2-4 mg total) by mouth as needed for diarrhea or loose stools.      Follow-up Information    [facility] Call on 05/18/2016.   Why:  [facility] will call you after discharge to schedule appointment. Pt can utilize the pharmacy onsite and medications range from $4.00 to $10.00.  Contact information: [place] [address] [phone]       Virl Axe, MD Follow up in 1 week(s).   Specialty:  Cardiology Why:  Patient to call and schedule appointment Contact information: [address] [place] [address] [phone]        Windy Kalata, MD Follow up in 1 week(s).   Specialty:  Nephrology Why:  Patient to call to schedule appointment.  Contact information: [address] [place] [address] [phone]          Allergies  Allergen Reactions  . Eggs Or Egg-Derived Products Hives  . Codeine Nausea Only    Tolerates hydrocodone    Consultations:  Cardiology EP and Heart Failure Team  Nephrology  Procedures/Studies: Dg Chest 2 View  Result Date: 05/12/2016 CLINICAL DATA:  Renal failure and hypotension EXAM: CHEST  2 VIEW COMPARISON:  04/02/2016 FINDINGS: The cardiac shadow is stable. Defibrillator is again noted and stable. No focal infiltrate or sizable effusion is seen. Some chronic  changes are noted in the left lung base IMPRESSION: No acute abnormality noted. Electronically Signed   By: Inez Catalina M.D.   On: 05/12/2016 16:36   US Renal  Result Date: 05/13/2016 CLINICAL DATA:  Acute renal failure. EXAM: RENAL / URINARY TRACT ULTRASOUND COMPLETE COMPARISON:  CT 02/28/2014 FINDINGS: Right Kidney: Length: 9.5 cm. Renal contours are lobular. Cyst arising from the lower pole measures 4.1 x 4.5 x 5.8 cm. Smaller cyst in the upper kidney measures 1.5 cm. There is thinning of the renal parenchyma. Small stones on prior CT are not seen sonographically. Left Kidney: Length: 11.6 cm. Renal  contours are lobular and there is thinning of the renal parenchymal. Renal calcifications on prior CT are not seen sonographically. Bladder: Appears normal for degree of bladder distention. IMPRESSION: 1. No obstructive uropathy. 2. Thinning of the bilateral renal parenchyma with lobular contours, chronic. 3. Right renal cysts. Electronically Signed   By: Jeb Levering M.D.   On: 05/13/2016 01:42   Dg Chest Portable 1 View  Result Date: 05/18/2016 CLINICAL DATA:  hypotension, shortness of breath this am,as a lower hernia , on his way to [facility] for hernia repair. Felt sick EXAM: PORTABLE CHEST 1 VIEW COMPARISON:  05/12/2016 FINDINGS: Defibrillator noted, lead position unchanged. Prior CABG. Mild linear scarring at the left lung base. The lungs appear otherwise clear. Heart size within normal limits for projection. Mild thoracic spondylosis. IMPRESSION: 1.  No active cardiopulmonary disease is radiographically apparent. 2. Prior CABG.  Defibrillator lead position unchanged. 3. Mild scarring at the left lung base. Electronically Signed   By: Van Clines M.D.   On: 05/18/2016 09:27   ECHOCARDIOGRAM Study Conclusions  - Left ventricle: The cavity size was mildly dilated. Wall   thickness was normal. Systolic function was normal. The estimated   ejection fraction was in the range of 50% to  55%. There is   hypokinesis of the apical myocardium. Doppler parameters are   consistent with abnormal left ventricular relaxation (grade 1   diastolic dysfunction). - Left atrium: The atrium was mildly dilated. - Pulmonary arteries: Systolic pressure was mildly increased. PA   peak pressure: 43 mm Hg (S).  Impressions:  - Apical hypokinesis with overall low normal LV systolic function;   grade 1 diastolic dysfunction; mild LAE; mild TR with mildly   elevated pulmonary pressure.   Subjective: Patient was seen and examined at bedside this AM and he was ready to be D/C'd Home. Denied Cp/SOB/N/V/Abdominal Pain. Stated that he wanted to go home.   Discharge Exam: Vitals:   05/16/16 0540 05/16/16 0812  BP: 116/76 119/87  Pulse: 90 98  Resp:    Temp: 98.2 F (36.8 C) 98.3 F (36.8 C)   Vitals:   05/15/16 2240 05/15/16 2328 05/16/16 0540 05/16/16 0812  BP: 137/90  116/76 119/87  Pulse:  81 90 98  Resp:      Temp: 98.8 F (37.1 C)  98.2 F (36.8 C) 98.3 F (36.8 C)  TempSrc: Oral  Oral Oral  SpO2:  96% 94% 94%  Weight:   81.4 kg (179 lb 8 oz)   Height:       General: Pt is alert, awake, not in acute distress Cardiovascular: RRR, S1/S2 +, no rubs, no gallops Respiratory: CTA bilaterally, no wheezing, no rhonchi Abdominal: Soft, NT, ND, bowel sounds + Extremities: Mild edema, no cyanosis  The results of significant diagnostics from this hospitalization (including imaging, microbiology, ancillary and laboratory) are listed below for reference.    Microbiology: Recent Results (from the past 240 hour(s))  Blood culture (routine x 2)     Status: None (Preliminary result)   Collection Time: 05/18/16  9:40 AM  Result Value Ref Range Status   Specimen Description BLOOD LEFT ANTECUBITAL  Final   Special Requests   Final    BOTTLES DRAWN AEROBIC AND ANAEROBIC 10CC AER 5CC ANA   Culture NO GROWTH 1 DAY  Final   Report Status PENDING  Incomplete  Blood culture (routine x 2)      Status: None (Preliminary result)   Collection Time: 05/18/16  9:45 AM  Result  Value Ref Range Status   Specimen Description BLOOD LEFT HAND  Final   Special Requests BOTTLES DRAWN AEROBIC ONLY 10CC  Final   Culture NO GROWTH 1 DAY  Final   Report Status PENDING  Incomplete  MRSA PCR Screening     Status: None   Collection Time: 05/18/16  2:11 PM  Result Value Ref Range Status   MRSA by PCR NEGATIVE NEGATIVE Final    Comment:        The GeneXpert MRSA Assay (FDA approved for NASAL specimens only), is one component of a comprehensive MRSA colonization surveillance program. It is not intended to diagnose MRSA infection nor to guide or monitor treatment for MRSA infections.      Labs: BNP (last 3 results)  Recent Labs  05/12/16 1707 05/18/16 0924  BNP 188.5*  182.9* 47.6   Basic Metabolic Panel:  Recent Labs Lab 05/12/16 1707  05/14/16 0350  05/16/16 0514 05/18/16 0923 05/18/16 1820 05/18/16 2216 05/19/16 0235  NA  --   < > 144  < > 142 139 141 140 143  K  --   < > 4.3  < > 3.9 4.2 4.7 4.4 4.2  CL  --   < > 110  < > 104 103 109 109 112*  CO2  --   < > 25  < > 25 23 20* 21* 23  GLUCOSE  --   < > 83  < > 89 90 73 98 87  BUN  --   < > 88*  < > 60* 79* 67* 64* 58*  CREATININE  --   < > 4.35*  < > 3.68* 8.10* 6.42* 5.89* 5.21*  CALCIUM  --   < > 8.9  < > 9.9 8.7* 8.2* 8.2* 8.3*  MG 2.1  --  2.0  --   --  1.6*  --   --  2.2  PHOS 8.1*  --   --   --   --   --   --   --  5.6*  < > = values in this interval not displayed. Liver Function Tests:  Recent Labs Lab 05/13/16 0411 05/18/16 0923  AST 12* 13*  ALT 13* 16*  ALKPHOS 79 75  BILITOT 0.6 0.6  PROT 5.0* 6.3*  ALBUMIN 2.8* 3.5   No results for input(s): LIPASE, AMYLASE in the last 168 hours. No results for input(s): AMMONIA in the last 168 hours. CBC:  Recent Labs Lab 05/13/16 0411 05/14/16 0350 05/18/16 0923 05/19/16 0235  WBC 5.6 6.3 10.5 8.4  NEUTROABS  --  3.8 7.4  --   HGB 10.9* 11.9* 13.4  12.3*  HCT 33.0* 36.1* 41.5 38.0*  MCV 96.5 96.3 98.3 98.7  PLT 123* 160 194 216   Cardiac Enzymes:  Recent Labs Lab 05/12/16 1707 05/18/16 1820  TROPONINI 0.04* <0.03   BNP: Invalid input(s): POCBNP CBG: No results for input(s): GLUCAP in the last 168 hours. D-Dimer No results for input(s): DDIMER in the last 72 hours. Hgb A1c No results for input(s): HGBA1C in the last 72 hours. Lipid Profile No results for input(s): CHOL, HDL, LDLCALC, TRIG, CHOLHDL, LDLDIRECT in the last 72 hours. Thyroid function studies No results for input(s): TSH, T4TOTAL, T3FREE, THYROIDAB in the last 72 hours.  Invalid input(s): FREET3 Anemia work up No results for input(s): VITAMINB12, FOLATE, FERRITIN, TIBC, IRON, RETICCTPCT in the last 72 hours. Urinalysis    Component Value Date/Time   COLORURINE YELLOW 05/18/2016 1436   APPEARANCEUR  CLEAR 05/18/2016 1436   LABSPEC 1.013 05/18/2016 1436   PHURINE 5.5 05/18/2016 1436   GLUCOSEU NEGATIVE 05/18/2016 1436   HGBUR NEGATIVE 05/18/2016 [place] 05/18/2016 [facility] 05/18/2016 1436   PROTEINUR NEGATIVE 05/18/2016 1436   UROBILINOGEN 0.2 02/27/2014 1824   NITRITE NEGATIVE 05/18/2016 1436   [place] 05/18/2016 1436   Time coordinating discharge: Over 30 minutes  SIGNED:  Kerney Elbe, DO [facility] 05/19/2016, 4:02 PM Pager [phone]  If 7PM-7AM, please contact night-coverage [email or web address] Password [email or web address]

## 2016-05-20 ENCOUNTER — Inpatient Hospital Stay (HOSPITAL_COMMUNITY): Payer: Medicaid Other

## 2016-05-20 DIAGNOSIS — I959 Hypotension, unspecified: Secondary | ICD-10-CM

## 2016-05-20 LAB — CBC
HEMATOCRIT: 36.7 % — AB (ref 39.0–52.0)
Hemoglobin: 11.9 g/dL — ABNORMAL LOW (ref 13.0–17.0)
MCH: 31.4 pg (ref 26.0–34.0)
MCHC: 32.4 g/dL (ref 30.0–36.0)
MCV: 96.8 fL (ref 78.0–100.0)
PLATELETS: 198 10*3/uL (ref 150–400)
RBC: 3.79 MIL/uL — ABNORMAL LOW (ref 4.22–5.81)
RDW: 13.5 % (ref 11.5–15.5)
WBC: 7.6 10*3/uL (ref 4.0–10.5)

## 2016-05-20 LAB — BASIC METABOLIC PANEL
ANION GAP: 7 (ref 5–15)
BUN: 42 mg/dL — AB (ref 6–20)
CALCIUM: 8.5 mg/dL — AB (ref 8.9–10.3)
CO2: 21 mmol/L — AB (ref 22–32)
Chloride: 114 mmol/L — ABNORMAL HIGH (ref 101–111)
Creatinine, Ser: 3.42 mg/dL — ABNORMAL HIGH (ref 0.61–1.24)
GFR calc Af Amer: 21 mL/min — ABNORMAL LOW (ref >60)
GFR calc non Af Amer: 18 mL/min — ABNORMAL LOW (ref >60)
GLUCOSE: 79 mg/dL (ref 65–99)
POTASSIUM: 4.5 mmol/L (ref 3.5–5.1)
Sodium: 142 mmol/L (ref 135–145)

## 2016-05-20 LAB — PHOSPHORUS: Phosphorus: 3.9 mg/dL (ref 2.5–4.6)

## 2016-05-20 LAB — MAGNESIUM: Magnesium: 1.9 mg/dL (ref 1.7–2.4)

## 2016-05-20 NOTE — Progress Notes (Signed)
[facility] Progress Note Patient Name: Richard Cook DOB: 05-Nov-1955 MRN: [id number]   Date of Service  05/20/2016  HPI/Events of Note  Asked to consider transfer of patient to stepdown unit. Patient is now off Phenylephrine since about 10 AM this morning. BP at baseline (SBP = 100's). No other medications or interventions requiring ICU care according to bedside nurse.   eICU Interventions  Will order: 1. OK to transfer to stepdown unit with cardiac monitoring.      Intervention Category Minor Interventions: Routine modifications to care plan (e.g. PRN medications for pain, fever)  Richard Cook 05/20/2016, 5:26 PM

## 2016-05-20 NOTE — Progress Notes (Signed)
PULMONARY / CRITICAL CARE MEDICINE   Name: Richard Cook MRN: [id number] DOB: 07-Jun-1956    ADMISSION DATE:  05/18/2016 CONSULTATION DATE:  05/18/16  REFERRING MD:  [place] - EDP  CHIEF COMPLAINT:  Hypotension  HISTORY OF PRESENT ILLNESS:  Richard Cook is a 60 y.o. male with PMH as outlined below. He presented to [facility] ED 10/16 with lightheadedness and hypotension.  He was just admitted to [facility] 10/101/7 through 05/17/15 for AoCKD and acute decompensation of chronic combined heart failure.  Since discharge, he has had decreased PO intake due to no appetite and nausea.  Has had very minimal fluid intake as well.  Has also had oliguria with "just a few drops here and there".  On 10/15, he continued to take his PO meds (including furosemide, imdur) and later when he checked his BP, he noted it to be 87'F systolic.  On 10/16, he did not take any of his meds.  He continued to feel lightheaded; therefore, decided to call EMS.    Upon EMS arrival, he was found to have BP of 62/40 manually.  He was brought to ED where he remained hypotensive despite 3L IVF.  SCr was noted to be 8.1 (was 3.68 on discharge 2 days prior), Mg 1.6.  Remaining lab work essentially negative.  Due to persistent hypotension, PCCM was asked to admit.  Of note, during his recent admission, SBP was noted to remain in the 100-110 range.    SUBJECTIVE:  Feels better over all. Has chronic anxiety and pain.  Denies SOB/cough/abd pain.  Still on neosynephrine but lower dose. They weaned off but SBP was < 90 mm Hg.   VITAL SIGNS: BP 112/63   Pulse (!) 47   Temp 97.5 F (36.4 C) (Oral)   Resp 12   Ht 5\' 10"  (1.778 m)   Wt 84.8 kg (186 lb 15.2 oz)   SpO2 95%   BMI 26.82 kg/m   HEMODYNAMICS:    VENTILATOR SETTINGS:    INTAKE / OUTPUT: I/O last 3 completed shifts: In: 4213.4 [P.O.:480; I.V.:3733.4] Out: 6433 [Urine:4495]   PHYSICAL EXAMINATION: General: Middle aged male, in NAD. Neuro: A&O x 3,  non-focal.  HEENT: [place]/AT. PERRL, sclerae anicteric. Cardiovascular: RRR, no M/R/G.  Lungs: Respirations even and unlabored.  CTA bilaterally, No W/R/R. Abdomen: BS x 4, soft, NT/ND.  Musculoskeletal: No gross deformities, no edema.  Skin: Intact, warm, no rashes.  LABS:  BMET  Recent Labs Lab 05/18/16 2216 05/19/16 0235 05/20/16 0246  NA 140 143 142  K 4.4 4.2 4.5  CL 109 112* 114*  CO2 21* 23 21*  BUN 64* 58* 42*  CREATININE 5.89* 5.21* 3.42*  GLUCOSE 98 87 79    Electrolytes  Recent Labs Lab 05/18/16 0923  05/18/16 2216 05/19/16 0235 05/20/16 0246  CALCIUM 8.7*  < > 8.2* 8.3* 8.5*  MG 1.6*  --   --  2.2 1.9  PHOS  --   --   --  5.6* 3.9  < > = values in this interval not displayed.  CBC  Recent Labs Lab 05/18/16 0923 05/19/16 0235 05/20/16 0246  WBC 10.5 8.4 7.6  HGB 13.4 12.3* 11.9*  HCT 41.5 38.0* 36.7*  PLT 194 216 198    Coag's  Recent Labs Lab 05/18/16 0923  INR 1.00    Sepsis Markers  Recent Labs Lab 05/18/16 0936 05/18/16 1315  LATICACIDVEN 0.90 1.40    ABG No results for input(s): PHART, PCO2ART, PO2ART in the last 168  hours.  Liver Enzymes  Recent Labs Lab 05/18/16 0923  AST 13*  ALT 16*  ALKPHOS 75  BILITOT 0.6  ALBUMIN 3.5    Cardiac Enzymes  Recent Labs Lab 05/18/16 1820  TROPONINI <0.03    Glucose No results for input(s): GLUCAP in the last 168 hours.  Imaging Dg Chest Port 1 View  Result Date: 05/20/2016 CLINICAL DATA:  Shortness of breath. EXAM: PORTABLE CHEST 1 VIEW COMPARISON:  05/18/2016. FINDINGS: Cardiac pacer no with lead tip projected over the right ventricle. Prior CABG. Bibasilar atelectasis. Small bilateral pleural effusions cannot be excluded. No pneumothorax. IMPRESSION: 1. Prior CABG. Cardiac pacer noted projected over right ventricle. Heart size stable. 2. Mild bibasilar atelectasis. Tiny bilateral pleural effusions cannot be excluded. Electronically Signed   By: Marcello Moores  Register   On:  05/20/2016 07:17     STUDIES:  CXR 10/16 > no acute process.  CULTURES: Blood 10/16 > (-) MRSA 10/16 > (-)  ANTIBIOTICS: None.  SIGNIFICANT EVENTS: 05/12/16 - 05/16/16 > admitted with AoCKD, decompensated heart failure. 10/16 > re-admitted with hypotension, AoCKD.  LINES/TUBES: None.  DISCUSSION: 60 y.o. male with hx CKD and combined heart failure along with borderline chronic hypotension (SBP 100 range), admitted 10/16 with worsening renal function, hypotension with BP in 70's despite 3L IVF.  Presumed all due to hypovolemia / dehydration.  ASSESSMENT / PLAN:  CARDIOVASCULAR A:  Hypotension - presumed due to hypovolemia / dehydration.  Improved.  Of note, has borderline chronic hypotension with SBP ranging in 100-110 range.  Chronic combined heart failure - Echo from 05/14/16 with EF 50-55%, G1DD. Hx VT - started on PO amio on recent admission. Hx CAD / STEMI s/p CABG, HTN, HLD. P:  BP is better, baseline BP 096 systolic. Try to wean off neosynephrine.  Continue preadmission ASA. Hold preadmission furosemide, imdur, atorvastatin.  RENAL A:   AoCKD - baseline Creat on discharge last time was close to 4.  CKD st 4 at baseline P:   Will d/c IVF.  Creatinine at baseline now.   GASTROINTESTINAL A:   Nutrition. P:   Renal healthy diet.  HEMATOLOGIC A:   VTE Prophylaxis. P: SCD's / Heparin. CBC in AM.  NEUROLOGIC A:   Hx anxiety, insomnia, chronic pain. P:   Pt takes norco 10/325 at least daily x 10 yrs. Will decrease dose to 5/325 2/2 AKI q6 prn Pt takes clonazepam 2 mg BID. Will decrease dose to 0.5 BID prn. Cont trazodone for insomnia.   PULMONARY A: OSA per report - does not use CPAP. P:   Observe for now.   INFECTIOUS A:   No indication of infection. P:   Monitor clinically. (-) fever, WBC. Cultures (-) so far.   ENDOCRINE A:   No acute issues.   P:   No interventions required.   Family updated: None available. Pt updated at  bedside.   Interdisciplinary Family Meeting v Palliative Care Meeting:  Due by: 05/25/16.  Critical care time with this patient today : 30 minutes.    Monica Becton, MD 05/20/2016, 9:37 AM [facility] Pager [phone] After 3 pm or if no answer, call [phone]

## 2016-05-21 ENCOUNTER — Telehealth: Payer: Self-pay | Admitting: Family Medicine

## 2016-05-21 ENCOUNTER — Other Ambulatory Visit: Payer: Self-pay | Admitting: Family Medicine

## 2016-05-21 LAB — BASIC METABOLIC PANEL
ANION GAP: 6 (ref 5–15)
BUN: 32 mg/dL — ABNORMAL HIGH (ref 6–20)
CHLORIDE: 114 mmol/L — AB (ref 101–111)
CO2: 22 mmol/L (ref 22–32)
Calcium: 8.8 mg/dL — ABNORMAL LOW (ref 8.9–10.3)
Creatinine, Ser: 2.64 mg/dL — ABNORMAL HIGH (ref 0.61–1.24)
GFR calc non Af Amer: 25 mL/min — ABNORMAL LOW (ref >60)
GFR, EST AFRICAN AMERICAN: 29 mL/min — AB (ref >60)
GLUCOSE: 75 mg/dL (ref 65–99)
POTASSIUM: 4.7 mmol/L (ref 3.5–5.1)
Sodium: 142 mmol/L (ref 135–145)

## 2016-05-21 LAB — CBC
HEMATOCRIT: 37.1 % — AB (ref 39.0–52.0)
HEMOGLOBIN: 12.2 g/dL — AB (ref 13.0–17.0)
MCH: 31.9 pg (ref 26.0–34.0)
MCHC: 32.9 g/dL (ref 30.0–36.0)
MCV: 96.9 fL (ref 78.0–100.0)
Platelets: 177 10*3/uL (ref 150–400)
RBC: 3.83 MIL/uL — ABNORMAL LOW (ref 4.22–5.81)
RDW: 13.6 % (ref 11.5–15.5)
WBC: 6.9 10*3/uL (ref 4.0–10.5)

## 2016-05-21 LAB — PHOSPHORUS: PHOSPHORUS: 2.9 mg/dL (ref 2.5–4.6)

## 2016-05-21 LAB — MAGNESIUM: Magnesium: 1.7 mg/dL (ref 1.7–2.4)

## 2016-05-21 MED ORDER — ISOSORBIDE MONONITRATE ER 30 MG PO TB24: 15.0000 mg | ORAL_TABLET | Freq: Every day | ORAL | 0 refills | Status: DC

## 2016-05-21 MED ORDER — ISOSORBIDE MONONITRATE ER 30 MG PO TB24
15.0000 mg | ORAL_TABLET | Freq: Every day | ORAL | Status: DC
  Administered 2016-05-21: 15 mg via ORAL
  Filled 2016-05-21 (×2): qty 1

## 2016-05-21 NOTE — Telephone Encounter (Signed)
Pt contacted office for refill request on the following medications:  [facility].  XB#[id number]  clonazePAM (KLONOPIN) 2 MG tablet  HYDROcodone-acetaminophen (NORCO) 10-325 MG tablet

## 2016-05-21 NOTE — Progress Notes (Addendum)
PULMONARY / CRITICAL CARE MEDICINE   Name: Richard Cook MRN: [id number] DOB: 16-Jun-1956    ADMISSION DATE:  05/18/2016 CONSULTATION DATE:  05/18/16  REFERRING MD:  Tegeler - EDP  CHIEF COMPLAINT:  Hypotension  HISTORY OF PRESENT ILLNESS:  Richard Cook is a 60 y.o. male with PMH as outlined below. He presented to [facility] ED 10/16 with lightheadedness and hypotension.  He was just admitted to [facility] 10/101/7 through 05/17/15 for AoCKD and acute decompensation of chronic combined heart failure.  Since discharge, he has had decreased PO intake due to no appetite and nausea.  Has had very minimal fluid intake as well.  Has also had oliguria with "just a few drops here and there".  On 10/15, he continued to take his PO meds (including furosemide, imdur) and later when he checked his BP, he noted it to be 25'K systolic.  On 10/16, he did not take any of his meds.  He continued to feel lightheaded; therefore, decided to call EMS.    Upon EMS arrival, he was found to have BP of 62/40 manually.  He was brought to ED where he remained hypotensive despite 3L IVF.  SCr was noted to be 8.1 (was 3.68 on discharge 2 days prior), Mg 1.6.  Remaining lab work essentially negative.  Due to persistent hypotension, PCCM was asked to admit.  Of note, during his recent admission, SBP was noted to remain in the 100-110 range.    SUBJECTIVE:  No issues overnight.  Has chronic anxiety and pain.  Denies SOB/cough/abd pain.  Off neosynephrine drip since 10 am 10/18  VITAL SIGNS: BP 101/66   Pulse (!) 43   Temp 97.5 F (36.4 C) (Oral)   Resp 10   Ht 5\' 10"  (1.778 m)   Wt 83.5 kg (184 lb 1.4 oz)   SpO2 95%   BMI 26.41 kg/m   HEMODYNAMICS:    VENTILATOR SETTINGS:    INTAKE / OUTPUT: I/O last 3 completed shifts: In: 1202.6 [P.O.:240; I.V.:962.6] Out: 2900 [Urine:2900]   PHYSICAL EXAMINATION: General: Middle aged male, in NAD. Neuro: A&O x 3, non-focal.  HEENT: [place]/AT. PERRL, sclerae  anicteric. Cardiovascular: RRR, no M/R/G.  Lungs: Respirations even and unlabored.  CTA bilaterally, No W/R/R. Abdomen: BS x 4, soft, NT/ND.  Musculoskeletal: No gross deformities, no edema.  Skin: Intact, warm, no rashes.  LABS:  BMET  Recent Labs Lab 05/19/16 0235 05/20/16 0246 05/21/16 0204  NA 143 142 142  K 4.2 4.5 4.7  CL 112* 114* 114*  CO2 23 21* 22  BUN 58* 42* 32*  CREATININE 5.21* 3.42* 2.64*  GLUCOSE 87 79 75    Electrolytes  Recent Labs Lab 05/19/16 0235 05/20/16 0246 05/21/16 0204  CALCIUM 8.3* 8.5* 8.8*  MG 2.2 1.9 1.7  PHOS 5.6* 3.9 2.9    CBC  Recent Labs Lab 05/19/16 0235 05/20/16 0246 05/21/16 0204  WBC 8.4 7.6 6.9  HGB 12.3* 11.9* 12.2*  HCT 38.0* 36.7* 37.1*  PLT 216 198 177    Coag's  Recent Labs Lab 05/18/16 0923  INR 1.00    Sepsis Markers  Recent Labs Lab 05/18/16 0936 05/18/16 1315  LATICACIDVEN 0.90 1.40    ABG No results for input(s): PHART, PCO2ART, PO2ART in the last 168 hours.  Liver Enzymes  Recent Labs Lab 05/18/16 0923  AST 13*  ALT 16*  ALKPHOS 75  BILITOT 0.6  ALBUMIN 3.5    Cardiac Enzymes  Recent Labs Lab 05/18/16 1820  TROPONINI <0.03  Glucose No results for input(s): GLUCAP in the last 168 hours.  Imaging No results found.   STUDIES:  CXR 10/16 > no acute process.  CULTURES: Blood 10/16 > (-) MRSA 10/16 > (-)  ANTIBIOTICS: None.  SIGNIFICANT EVENTS: 05/12/16 - 05/16/16 > admitted with AoCKD, decompensated heart failure. 10/16 > re-admitted with hypotension, AoCKD.  LINES/TUBES: None.  DISCUSSION: 60 y.o. male with hx CKD and combined heart failure along with borderline chronic hypotension (SBP 100 range), admitted 10/16 with worsening renal function, hypotension with BP in 70's despite 3L IVF.  Presumed all due to hypovolemia / dehydration.  ASSESSMENT / PLAN:  CARDIOVASCULAR A:  Hypotension - presumed due to hypovolemia / dehydration.  Improved.  Of  note, has borderline chronic hypotension with SBP ranging in 100-110 range.  Chronic combined heart failure - Echo from 05/14/16 with EF 50-55%, G1DD. Hx VT - started on PO amio on recent admission. Hx CAD / STEMI s/p CABG, HTN, HLD. P:  Off neosynephrine since 10 am 10/18 > BP has been stable. No issues.  Continue preadmission ASA. Hold off on diuretics on d/c.  Start IMDUR but on a lower dose at 15 mg/day (from 30 mg)  RENAL A:   AoCKD - baseline Creat on discharge last time was close to 4.  CKD st 4 at baseline. Creatinine now is 2. P:   Creatinine better at baseline.  Will hold off on diuretics for now.     GASTROINTESTINAL A:   Nutrition. P:   Renal healthy diet.  HEMATOLOGIC A:   VTE Prophylaxis. P: SCD's / Heparin. CBC in AM.  NEUROLOGIC A:   Hx anxiety, insomnia, chronic pain. P:   Cont norco and clonazepam > pt has meds at home.   PULMONARY A: OSA per report - does not use CPAP. P:   Observe for now.   INFECTIOUS A:   No indication of infection. P:   Monitor clinically. (-) fever, WBC. Cultures (-) so far.   ENDOCRINE A:   No acute issues.   P:   No interventions required.   Family updated: None available. Pt updated at bedside.   Pt has been hemodynamically stable since being off neo on 10/18 10 am. No cardiac or other issues. At baseline. Plan to d/c today. Pt was instructed to call cardiologist in am and discuss re: meds (lasix and imdur). He needs f/u with PCP in a week.    Monica Becton, MD 05/21/2016, 9:06 AM [facility] Pager [phone] After 3 pm or if no answer, call [phone]

## 2016-05-21 NOTE — Discharge Summary (Signed)
Physician Discharge Summary  Patient ID: Richard Cook MRN: [id number] DOB/AGE: May 03, 1956 60 y.o.  Admit date: 05/18/2016 Discharge date: 05/21/2016    Discharge Diagnoses:  Active Problems:   Acute kidney injury superimposed on chronic kidney disease ([place])                                                       D/c plan by Discharge Diagnosis  Hypotension/hypovolemic shock - Resolved.  Was r/t dehydration, cardiac meds and diuretics.  Has chronically borderline BP with SBP 100's.  Chronic combined heart failure  Hx VT Hx CAD / STEMI s/p CABG, HTN, HLD. PLAN -  Continue to hold diuretics  outpt cards f/u -- 10/24 _0   Resume imdur at 28m/day - half home dose   Continue amiodarone  ASA   Acute on CKD IV-- Resolved.  Baseline Scr ~4  PLAN -  Continue to hold outpt diuretics for now  outpt renal f/u  F/u chem   Hx anxiety, insomnia, chronic pain. PLAN -  Continue home norco, clonazepam  Avoid over-use   Nausea/vomiting - resolved.  ?r/t some degree of opiate withdrawal after previous hospitalization.  PLAN -  pcp f/u   Brief Summary: DYiannis Richard Cook a 60y.o. y/o male with extensive PMH including CKD III, chronic combined CHF, CAD, RA, SVT, ischemic cardiomyopathy with EF 30-35% who presented 10/16 with lightheadedness and hypotension.  [facility] for AoCKD and acute decompensation of chronic combined heart failure. After that d/c, he had decreased PO intake due to no appetite and nausea and minimal fluid intake. Now seems that some of his nausea/vomiting may have been r/t opiate withdrawal.  Has also had oliguria with "just a few drops here and there".  He continued to take his PO meds (including furosemide, imdur) and later when he checked his BP, he noted it to be 724'Psystolic and presented to ER.  Upon EMS arrival, he was found to have BP of 62/40 manually.  He was brought to ED where he remained hypotensive despite 3L  IVF.  SCr was noted to be 8.1 (was 3.68 on discharge 2 days prior), Mg 1.6.  Remaining lab work essentially negative. He was admitted to ICU with hypovolemic shock/dehydration.  He was given volume and started on vasopressors, all pre-admission cardiac medications held including diuretics.  There was no evidence to suggest infection, lactate was reassuring, troponin negative.  He improved slowly but did require several days of gentle hydration and low dose vasopressors before BP was WNL.  He was weaned off neo-synephrine 10/18.  On 10/19 he is doing well, tolerating PO diet, ambulating in unit with good BP.  He is medically cleared for d/c home pending close outpt monitoring from cardiology, PCP and nephrology.   Consults:  Lines/tubes:   Microbiology/Sepsis markers:   Significant Diagnostic Studies:      Vitals:   05/21/16 0200 05/21/16 0500 05/21/16 0623 05/21/16 0734  BP:   91/63 101/66  Pulse: (!) 49  (!) 40 (!) 43  Resp: (!) _1 Temp:    97.5 F (36.4 C)  TempSrc:    Oral  SpO2: 94%  94% 95%  Weight:  83.5 kg (184 lb 1.4 oz)    Height:  Discharge Labs  BMET  Recent Labs Lab 05/18/16 [id number] 05/18/16 1820 05/18/16 2216 05/19/16 0235 05/20/16 0246 05/21/16 0204  NA 139 141 140 143 142 142  K 4.2 4.7 4.4 4.2 4.5 4.7  CL 103 109 109 112* 114* 114*  CO2 23 20* 21* 23 21* 22  GLUCOSE 90 73 98 87 79 75  BUN 79* 67* 64* 58* 42* 32*  CREATININE 8.10* 6.42* 5.89* 5.21* 3.42* 2.64*  CALCIUM 8.7* 8.2* 8.2* 8.3* 8.5* 8.8*  MG 1.6*  --   --  2.2 1.9 1.7  PHOS  --   --   --  5.6* 3.9 2.9     CBC   Recent Labs Lab 05/19/16 0235 05/20/16 0246 05/21/16 0204  HGB 12.3* 11.9* 12.2*  HCT 38.0* 36.7* 37.1*  WBC 8.4 7.6 6.9  PLT 216 198 177   Anti-Coagulation  Recent Labs Lab 05/18/16 0923  INR 1.00         Follow-up Information    [facility] Go on 05/26/2016.   Specialty:  Internal Medicine Why:  at 10:45am  for an appointment with the [facility] with Dr Lisette Grinder information: [id number] [place] [phone]       Ulla Potash., MD .   Specialty:  Nephrology Why:  Office will call you.  If you do not hear from them by Monday please call their office.  Contact information: [place] [address] [place], PA-C Follow up on 05/26/2016.   Specialties:  Cardiology, Physician Assistant Why:  8:45am --  Contact information: [address] [place] [address] [phone]              Medication List    STOP taking these medications   furosemide 40 MG tablet Commonly known as:  LASIX     TAKE these medications   allopurinol 100 MG tablet Commonly known as:  ZYLOPRIM Take 1 tablet (100 mg total) by mouth daily. What changed:  Another medication with the same name was removed. Continue taking this medication, and follow the directions you see here.   amiodarone 200 MG tablet Commonly known as:  PACERONE Take 1 tablet (200 mg total) by mouth 2 (two) times daily. Start taking on:  05/24/2016 What changed:  Another medication with the same name was removed. Continue taking this medication, and follow the directions you see here.   aspirin EC 81 MG tablet Take 81 mg by mouth daily.   atorvastatin 20 MG tablet Commonly known as:  LIPITOR Take 1 tablet (20 mg total) by mouth daily.   clonazePAM 2 MG tablet Commonly known as:  KLONOPIN TAKE 1/2 TO 1 TABLETS BY MOUTH 3 TIMES A DAY AS NEEDED FOR ANXIETY What changed:  Another medication with the same name was removed. Continue taking this medication, and follow the directions you see here.   HYDROcodone-acetaminophen 10-325 MG tablet Commonly known as:  NORCO Take 1 tablet by mouth every 6 (six) hours as needed for moderate pain.   isosorbide mononitrate 30 MG 24 hr tablet Commonly known as:  IMDUR Take  0.5 tablets (15 mg total) by mouth daily. Start taking on:  05/22/2016 What changed:  how much to take   loperamide 2 MG capsule Commonly known as:  IMODIUM Take 1-2 capsules (2-4 mg total) by mouth as needed for diarrhea or loose stools.  Disposition: 01-Home or Self Care  Discharged Condition: Richard Cook has met maximum benefit of inpatient care and is medically stable and cleared for discharge.  Patient is pending follow up as above.      Time spent on disposition:  Greater than 35 minutes.   Signed: Nickolas Madrid, NP 05/21/2016  11:11 AM Pager: [phone]) [phone] or [phone]   Physician Discharge Summary    Discharged Condition: stable  Physician Statement:   The Patient was personally examined, the discharge assessment and plan has been personally reviewed and I agree with [facility] assessment and plan. > 30 minutes of time have been dedicated to discharge assessment, planning and discharge instructions.    Pt was seen today.  Plan discussed with pt. Pls see separate progress note today.   Signed:  J. [place] 05/21/2016, 4:17 PM [facility] Pager [phone] After 3 pm or if no answer, call [phone]

## 2016-05-21 NOTE — Telephone Encounter (Signed)
Please review. Thanks!  

## 2016-05-21 NOTE — Telephone Encounter (Signed)
Pt was discharged from [facility] on 05/21/2016 for kidney failure and blood pressure.  I have schedule a hospital follow up appointment/MW

## 2016-05-21 NOTE — Progress Notes (Signed)
Pt for disch today. Pt aware has appt w transitional care clinic at [facility] and wellness clinc on Tuesday.

## 2016-05-22 ENCOUNTER — Telehealth: Payer: Self-pay

## 2016-05-22 ENCOUNTER — Telehealth: Payer: Self-pay | Admitting: Licensed Clinical Social Worker

## 2016-05-22 MED ORDER — CLONAZEPAM 2 MG PO TABS: ORAL_TABLET | ORAL | 3 refills | Status: DC

## 2016-05-22 NOTE — Telephone Encounter (Signed)
05/22/16 5:28 PM  LCSWA introduced self and explained role at [facility].   Pt disclosed that he was recently discharged from the hospital and is unable to work for approx 4-6 months. Pt has housing at present time; however, requested resources for next month.   Plan of Action: LCSWA will provide pt with requested community resources at his upcoming appt scheduled for Tuesday, May 26, 2016. Pt was provided with LCSWA's contact information.    Christa See, MSW, LCSWA Clinical Social Worker 05/22/16 6:06 pm

## 2016-05-22 NOTE — Telephone Encounter (Signed)
Rx called in to pharmacy. 

## 2016-05-22 NOTE — Telephone Encounter (Signed)
Transitional Care Clinic Post-discharge Follow-Up Phone Call:  Date of Discharge: 05/21/16 Principal Discharge Diagnosis(es): acute kidney injury superimposed on chronic kidney disease, hypotension/hypovolemic shock, chronic combined heart failure Call Completed: Yes                 With Whom: Patient   Please check all that apply:  X  Patient is knowledgeable of his/her condition(s) and/or treatment. X  Patient is caring for self at home.  ? Patient is receiving assist at home from family and/or caregiver. Family and/or caregiver is knowledgeable of patient's condition(s) and/or treatment. ? Patient is receiving home health services. If so, name of agency.     Medication Reconciliation:  X  Medication list reviewed with patient. X  Patient obtained all discharge medications-No. Patient's medication list thoroughly reviewed. Informed patient that per discharge summary he is hold Lasix 40 mg tablet. Patient aware; he indicated he was instructed to hold Lasix until given further instructions at his upcoming Cardiology appointment on 05/26/16. Patient has not yet picked up discharge medications but indicated he has a [place] letter and will pick up medications today. This Case Manager also reminded patient of pharmacy resources available at [facility]. Patient verbalized understanding. Instructed patient to bring all medications to upcoming appointment on 05/26/16 at 1045. Patient verbalized understanding.   Activities of Daily Living:  X  Independent ? Needs assist  ? [facility] )   Community resources in place for patient:  X  None  ? Home Health/Home DME ? Assisted Living ? Support Group          Patient Education: Reminded patient he would benefit from meeting with Development worker, community at [employer] and [place] to determine if eligible for [employer] or [employer]. Patient appreciative of information.        Questions/Concerns  discussed: This Case Manager inquired about patient's status. Patient indicated he is "alright so far" and stated his "blood pressure staying up so far." Patient indicated he needed additional housing resources. He lives at a [facility] and reported he is paid up until the end of the month. Patient indicated motel has a new owner that will make him change rooms frequently, and he will be unable to do that. He also indicated he currently does not have any income and is aware he needs to appeal disability decision as he has been denied disability in the past.  Informed patient he would benefit from contacting the [employer] for housing options/resources. In addition, this Case Manager spoke with [place], SW at [facility] and [facility], who indicated she would contact patient today to discuss housing resources. Patient appreciative.   Patient reminded up upcoming Cardiology appointment on 05/26/16 at [facility] with Richardson Dopp, PA and Transitional Care Clinic appointment on 05/26/16 at 1045 with Dr. Jarold Song. Patient aware of appointments and indicated he will have transportation to his upcoming appointments. No additional needs identified at this time.

## 2016-05-22 NOTE — Telephone Encounter (Signed)
Please call in clonazepam  

## 2016-05-23 LAB — CULTURE, BLOOD (ROUTINE X 2)
CULTURE: NO GROWTH
Culture: NO GROWTH

## 2016-05-25 ENCOUNTER — Telehealth: Payer: Self-pay
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
## 2016-05-25 NOTE — Telephone Encounter (Signed)
Call placed to the patient and confirmed his appointment at the [facility] tomorrow, 05/26/16 @ 1045. He said that he has an appointment with cardiology tomorrow prior to the [facility] appointment and he has transportation to his appointments. He also noted that he will be bringing his medications with him to his appointments and confirmed that he picked up all medications at the pharmacy  He voiced concerns about needing to look for a new place to live. He said that he currently is staying at the [employer] and the rent is $700/month and he has difficulty paying that rent and now starting November 2017 the rent will increase to $800/month. Informed him that Christa See, LCSW will have some housing resources for him when he is at the clinic tomorrow. No other concerns reported at this time.

## 2016-05-26 ENCOUNTER — Encounter (INDEPENDENT_AMBULATORY_CARE_PROVIDER_SITE_OTHER): Payer: Self-pay

## 2016-05-26 ENCOUNTER — Ambulatory Visit (INDEPENDENT_AMBULATORY_CARE_PROVIDER_SITE_OTHER): Payer: Self-pay | Admitting: *Deleted

## 2016-05-26 ENCOUNTER — Telehealth: Payer: Self-pay

## 2016-05-26 ENCOUNTER — Ambulatory Visit (INDEPENDENT_AMBULATORY_CARE_PROVIDER_SITE_OTHER): Payer: Self-pay | Admitting: Physician Assistant

## 2016-05-26 ENCOUNTER — Telehealth: Payer: Self-pay | Admitting: *Deleted

## 2016-05-26 ENCOUNTER — Encounter: Payer: Self-pay | Admitting: Physician Assistant

## 2016-05-26 ENCOUNTER — Encounter: Payer: Self-pay | Admitting: Internal Medicine

## 2016-05-26 ENCOUNTER — Ambulatory Visit: Payer: Medicaid Other | Attending: Family Medicine | Admitting: Family Medicine

## 2016-05-26 ENCOUNTER — Encounter: Payer: Self-pay | Admitting: Family Medicine

## 2016-05-26 VITALS — BP 118/84 | HR 70 | Ht 70.0 in | Wt 185.0 lb

## 2016-05-26 VITALS — BP 108/75 | HR 65 | Temp 97.7°F | Ht 70.0 in | Wt 185.8 lb

## 2016-05-26 DIAGNOSIS — N183 Chronic kidney disease, stage 3 (moderate): Secondary | ICD-10-CM | POA: Insufficient documentation

## 2016-05-26 DIAGNOSIS — I2510 Atherosclerotic heart disease of native coronary artery without angina pectoris: Secondary | ICD-10-CM

## 2016-05-26 DIAGNOSIS — N184 Chronic kidney disease, stage 4 (severe): Secondary | ICD-10-CM

## 2016-05-26 DIAGNOSIS — E785 Hyperlipidemia, unspecified: Secondary | ICD-10-CM | POA: Diagnosis not present

## 2016-05-26 DIAGNOSIS — M109 Gout, unspecified: Secondary | ICD-10-CM | POA: Insufficient documentation

## 2016-05-26 DIAGNOSIS — I959 Hypotension, unspecified: Secondary | ICD-10-CM | POA: Insufficient documentation

## 2016-05-26 DIAGNOSIS — I10 Essential (primary) hypertension: Secondary | ICD-10-CM

## 2016-05-26 DIAGNOSIS — N179 Acute kidney failure, unspecified: Secondary | ICD-10-CM | POA: Insufficient documentation

## 2016-05-26 DIAGNOSIS — I5023 Acute on chronic systolic (congestive) heart failure: Secondary | ICD-10-CM

## 2016-05-26 DIAGNOSIS — G8929 Other chronic pain: Secondary | ICD-10-CM | POA: Insufficient documentation

## 2016-05-26 DIAGNOSIS — I5042 Chronic combined systolic (congestive) and diastolic (congestive) heart failure: Secondary | ICD-10-CM

## 2016-05-26 DIAGNOSIS — I255 Ischemic cardiomyopathy: Secondary | ICD-10-CM | POA: Insufficient documentation

## 2016-05-26 DIAGNOSIS — Z95810 Presence of automatic (implantable) cardiac defibrillator: Secondary | ICD-10-CM | POA: Diagnosis not present

## 2016-05-26 DIAGNOSIS — I472 Ventricular tachycardia, unspecified: Secondary | ICD-10-CM

## 2016-05-26 DIAGNOSIS — Z951 Presence of aortocoronary bypass graft: Secondary | ICD-10-CM | POA: Diagnosis not present

## 2016-05-26 DIAGNOSIS — M545 Low back pain: Secondary | ICD-10-CM | POA: Diagnosis not present

## 2016-05-26 DIAGNOSIS — I130 Hypertensive heart and chronic kidney disease with heart failure and stage 1 through stage 4 chronic kidney disease, or unspecified chronic kidney disease: Secondary | ICD-10-CM | POA: Diagnosis not present

## 2016-05-26 DIAGNOSIS — N189 Chronic kidney disease, unspecified: Secondary | ICD-10-CM

## 2016-05-26 DIAGNOSIS — G4709 Other insomnia: Secondary | ICD-10-CM

## 2016-05-26 DIAGNOSIS — F411 Generalized anxiety disorder: Secondary | ICD-10-CM

## 2016-05-26 DIAGNOSIS — Z79899 Other long term (current) drug therapy: Secondary | ICD-10-CM

## 2016-05-26 LAB — CUP PACEART INCLINIC DEVICE CHECK
Battery Remaining Longevity: 112 mo
Battery Voltage: 3.01 V
HIGH POWER IMPEDANCE MEASURED VALUE: 80 Ohm
Implantable Lead Location: 753860
Implantable Lead Serial Number: 327195
Lead Channel Impedance Value: 551 Ohm
Lead Channel Impedance Value: 589 Ohm
Lead Channel Pacing Threshold Pulse Width: 0.4 ms
Lead Channel Setting Pacing Amplitude: 2 V
Lead Channel Setting Pacing Pulse Width: 0.4 ms
Lead Channel Setting Sensing Sensitivity: 0.3 mV
MDC IDC LEAD IMPLANT DT: 20150318
MDC IDC MSMT LEADCHNL RV PACING THRESHOLD AMPLITUDE: 0.625 V
MDC IDC MSMT LEADCHNL RV SENSING INTR AMPL: 11 mV
MDC IDC SESS DTM: 20171024125524
MDC IDC STAT BRADY RV PERCENT PACED: 0.48 %

## 2016-05-26 LAB — BASIC METABOLIC PANEL
BUN: 40 mg/dL — AB (ref 7–25)
CALCIUM: 9.5 mg/dL (ref 8.6–10.3)
CO2: 21 mmol/L (ref 20–31)
CREATININE: 3.17 mg/dL — AB (ref 0.70–1.33)
Chloride: 106 mmol/L (ref 98–110)
Glucose, Bld: 87 mg/dL (ref 65–99)
Potassium: 4.6 mmol/L (ref 3.5–5.3)
Sodium: 139 mmol/L (ref 135–146)

## 2016-05-26 MED ORDER — FUROSEMIDE 40 MG PO TABS: 40.0000 mg | ORAL_TABLET | ORAL | 11 refills | Status: DC

## 2016-05-26 MED ORDER — FUROSEMIDE 40 MG PO TABS: 40.0000 mg | ORAL_TABLET | ORAL | Status: DC

## 2016-05-26 MED ORDER — MELATONIN 3 MG PO CAPS: 3.0000 mg | ORAL_CAPSULE | Freq: Every day | ORAL | 0 refills | Status: DC

## 2016-05-26 NOTE — Patient Instructions (Addendum)
Medication Instructions:  START LASIX 40 MG 1 TABLET EVERY MON, WED AND FRI;  Labwork: 1. TODAY BMET 2. 06/01/16 BMET WHEN YOU SEE DR. KLEIN  Testing/Procedures: Your physician has recommended that you have a pulmonary function test. Pulmonary Function Tests are a group of tests that measure how well air moves in and out of your lungs.  Follow-Up: KEEP YOUR APPT WITH DR. [name] 06/01/16  Any Other Special Instructions Will Be Listed Below (If Applicable).  If you need a refill on your cardiac medications before your next appointment, please call your pharmacy.

## 2016-05-26 NOTE — Addendum Note (Signed)
Addended by: Michae Kava on: 05/26/2016 05:21 PM   Modules accepted: Orders

## 2016-05-26 NOTE — Telephone Encounter (Signed)
Pt has been notified of lab results and findings by phone with verbal understanding. Pt states [facility] was supposed to call him and schedule an appt, though this has not yet been done. Advised per Brynda Rim PA I will call [facility] and schedule appt and will let him know of the date and time. Pt said thank you.

## 2016-05-26 NOTE — Progress Notes (Signed)
ICD check in clinic--add on per [employer]. Normal device function. Thresholds and sensing consistent with previous device measurements. Impedance trends stable over time. No evidence of any ventricular arrhythmias. No mode switches. Histogram distribution appropriate for patient and level of activity. No changes made this session. Device programmed at appropriate safety margins. Device programmed to optimize intrinsic conduction. Estimated longevity 9.19yrs. Pt enrolled in remote follow-up. ROV with SK 10/30. Patient education completed including shock plan.

## 2016-05-26 NOTE — Telephone Encounter (Signed)
Met with the patient when he was in the clinic today to see Dr Jarold Song. He verbalized concerns about his housing situation and noted that he needs to leave his current residence - [employer] at the end of this month - in 1 week. He said that he has no family/friends for support and his one sister told him that she can't help him. He said that he only has about $800 left to his name. He stated that Dr [name] told him that he is not able to work. The patient stated that he applied for disability when he was in the hospital and he showed this CM a document from the [facility] with the contact numbers if he has questions about the disability application. Encouraged him to call the contact person if he has questions and is concerned about the status of the application at any time.  He also has the card with information about contacting  [address] if he needs information about local resources. This CM provided him with a copy of housing resources from [address] and explained the roles of the [facility]) and [employer] and encouraged him to go to the [facility] to explain his current housing needs and that he is on the verge of homelessness  Also encouraged him to apply for food stamps at [employer]. He stated that he would go to DSS today and possibly to the [facility].  If not today, he said that he will go to [facility] in the morning.  Also provided him with an application for the [employer] and [employer] and explained the advantages of applying for programs.  He noted that he plans to follow up with his PCP - Dr Caryn Section 06/02/16.

## 2016-05-27 ENCOUNTER — Other Ambulatory Visit: Payer: Self-pay | Admitting: Family Medicine

## 2016-05-27 DIAGNOSIS — M5489 Other dorsalgia: Secondary | ICD-10-CM

## 2016-05-27 MED ORDER — HYDROCODONE-ACETAMINOPHEN 10-325 MG PO TABS: 1.0000 | ORAL_TABLET | Freq: Four times a day (QID) | ORAL | 0 refills | Status: DC | PRN
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
## 2016-05-27 NOTE — Telephone Encounter (Signed)
Pt contacted office for refill request on the following medications:  HYDROcodone-acetaminophen (NORCO) 10-325 MG tablet.  Pt states he only has 2 pills left and he will be out tomorrow morning.  JN#[phone]/IO  Pt states this is the 2nd request he has called in for this/MW

## 2016-05-28 ENCOUNTER — Ambulatory Visit (HOSPITAL_COMMUNITY)
Admission: RE | Admit: 2016-05-28 | Discharge: 2016-05-28 | Disposition: A | Discharge status: Home / Self Care | Payer: Medicaid Other | Source: Ambulatory Visit | Attending: Physician Assistant | Admitting: Physician Assistant

## 2016-05-28 DIAGNOSIS — Z79899 Other long term (current) drug therapy: Secondary | ICD-10-CM | POA: Insufficient documentation

## 2016-05-28 LAB — PULMONARY FUNCTION TEST
DL/VA % pred: 109 %
DL/VA: 4.84 ml/min/mmHg/L
DLCO COR % PRED: 89 %
DLCO COR: 25.22 ml/min/mmHg
DLCO unc % pred: 82 %
DLCO unc: 23.33 ml/min/mmHg
FEF 25-75 POST: 3.73 L/s
FEF 25-75 Pre: 2.86 L/sec
FEF2575-%CHANGE-POST: 30 %
FEF2575-%PRED-POST: 137 %
FEF2575-%PRED-PRE: 105 %
FEV1-%CHANGE-POST: 7 %
FEV1-%Pred-Post: 93 %
FEV1-%Pred-Pre: 87 %
FEV1-POST: 3.04 L
FEV1-Pre: 2.83 L
FEV1FVC-%CHANGE-POST: -2 %
FEV1FVC-%PRED-PRE: 106 %
FEV6-%Change-Post: 9 %
FEV6-%Pred-Post: 93 %
FEV6-%Pred-Pre: 85 %
FEV6-POST: 3.82 L
FEV6-Pre: 3.5 L
FEV6FVC-%Change-Post: 0 %
FEV6FVC-%Pred-Post: 104 %
FEV6FVC-%Pred-Pre: 105 %
FVC-%CHANGE-POST: 9 %
FVC-%PRED-POST: 89 %
FVC-%PRED-PRE: 81 %
FVC-POST: 3.84 L
FVC-PRE: 3.5 L
POST FEV1/FVC RATIO: 79 %
PRE FEV1/FVC RATIO: 81 %
PRE FEV6/FVC RATIO: 100 %
Post FEV6/FVC ratio: 99 %
RV % pred: 133 %
RV: 2.77 L
TLC % PRED: 96 %
TLC: 6.15 L

## 2016-05-28 MED ORDER — ALBUTEROL SULFATE (2.5 MG/3ML) 0.083% IN NEBU
2.5000 mg | INHALATION_SOLUTION | Freq: Once | RESPIRATORY_TRACT | Status: AC
  Administered 2016-05-28: 2.5 mg via RESPIRATORY_TRACT

## 2016-05-29 ENCOUNTER — Telehealth: Payer: Self-pay | Admitting: *Deleted

## 2016-05-29 ENCOUNTER — Encounter: Payer: Self-pay | Admitting: Physician Assistant

## 2016-05-29 ENCOUNTER — Encounter: Payer: Self-pay | Admitting: Internal Medicine

## 2016-05-29 NOTE — Telephone Encounter (Signed)
Pt notified of PFT results show normal lung function per Richardson Dopp, PA.

## 2016-05-29 NOTE — Telephone Encounter (Signed)
Spoke with patient regarding ICD shock this morning.  Patient states that he felt dizzy last night and felt his heart racing but finally went to bed after he checked his B/P and HR and they were "normal".  He woke up this morning and felt better, was brushing his teeth when he received a shock.  He has been taking all medications, including amiodarone, as instructed.  Patient denies cardiac symptoms this morning, including ShOB, chest discomfort, or dizziness, but states his stomach has been hurting since the shock.  Reviewed transmission, VT at 167bpm, ATP x10 failed, rhythm converted to SR with 35J shock.  Patient wonders what he should do about his stomach pain--wants to know if it's related to his kidneys.  Advised that per the shock plan, he should proceed to the ED as Dr. [name] is not in the office at this time.  Patient declines, states the pain is lessening.  He is aware of [place] DMV driving restrictions x6 months.  Patient verbalizes understanding of all instructions and states that he will wait until his appointment with Dr. [name] on Monday, 10/30 at 4:00pm unless his symptoms worsen in the interim.  He denies additional questions or concerns at this time.  Episode printed for review by Dr. [name].

## 2016-06-01 ENCOUNTER — Encounter: Payer: Self-pay | Admitting: Internal Medicine

## 2016-06-01 ENCOUNTER — Other Ambulatory Visit: Payer: Self-pay | Admitting: *Deleted

## 2016-06-01 ENCOUNTER — Ambulatory Visit (INDEPENDENT_AMBULATORY_CARE_PROVIDER_SITE_OTHER): Payer: Self-pay | Admitting: Internal Medicine

## 2016-06-01 VITALS — BP 120/90 | HR 66 | Ht 70.0 in | Wt 187.4 lb

## 2016-06-01 DIAGNOSIS — I5042 Chronic combined systolic (congestive) and diastolic (congestive) heart failure: Secondary | ICD-10-CM

## 2016-06-01 DIAGNOSIS — Z95810 Presence of automatic (implantable) cardiac defibrillator: Secondary | ICD-10-CM

## 2016-06-01 DIAGNOSIS — I472 Ventricular tachycardia, unspecified: Secondary | ICD-10-CM

## 2016-06-01 DIAGNOSIS — N184 Chronic kidney disease, stage 4 (severe): Secondary | ICD-10-CM

## 2016-06-01 LAB — BASIC METABOLIC PANEL
BUN: 36 mg/dL — AB (ref 7–25)
CHLORIDE: 108 mmol/L (ref 98–110)
CO2: 17 mmol/L — AB (ref 20–31)
CREATININE: 2.56 mg/dL — AB (ref 0.70–1.33)
Calcium: 9.4 mg/dL (ref 8.6–10.3)
Glucose, Bld: 89 mg/dL (ref 65–99)
Potassium: 4.6 mmol/L (ref 3.5–5.3)
Sodium: 140 mmol/L (ref 135–146)

## 2016-06-01 MED ORDER — AMIODARONE HCL 200 MG PO TABS: 400.0000 mg | ORAL_TABLET | Freq: Two times a day (BID) | ORAL | 0 refills | Status: DC

## 2016-06-01 NOTE — Patient Instructions (Signed)
Your physician has recommended you make the following change in your medication:  1.) increase amiodarone to 400 mg twice a day for 2 weeks, then go back to 200 mg twice a day  Your physician recommends that you schedule a follow-up appointment in: 4 weeks with Dr. [name].
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
## 2016-06-02 ENCOUNTER — Ambulatory Visit: Payer: Self-pay | Admitting: Family Medicine

## 2016-06-02 ENCOUNTER — Telehealth: Payer: Self-pay | Admitting: *Deleted

## 2016-06-02 ENCOUNTER — Telehealth: Payer: Self-pay | Admitting: Internal Medicine

## 2016-06-02 MED FILL — AMIODARONE HCL 200 MG TAB: 200 | 14 days supply | Qty: 56 | Fill #0

## 2016-06-02 NOTE — Telephone Encounter (Signed)
Lmtcb to go over lab results 

## 2016-06-02 NOTE — Telephone Encounter (Signed)
I called and spoke with the patient.  He states that someone from [facility] was supposed to call us regarding his upcoming hernia surgery. I advised him that I have not heard from anyone today.  He gave me the name of [facility] general surgery Phone- [phone].  I advised the patient I would call there tomorrow to try to get more information. He is agreeable.

## 2016-06-02 NOTE — Telephone Encounter (Signed)
Pt calling regarding needing hernia repair at [facility]. Was told by Dr. [name] to wait about a month due to recent hospital stay he has a 5-10% chance he may die, although he doesn't think he can wait a month -pls call [phone]

## 2016-06-03 NOTE — Telephone Encounter (Signed)
Pt notified of lab results by phone with verbal understanding. I asked pt how was his breathing doing. Pt states breathing is doing ok, it has not been any worse. I asked how his weight has been. Pt said he only take his lasix every 3-4 days like as listed in his chart. His weight on Saturday 10/28 was 178 (pt states this was after he took his lasix), he did not have a weight before the lasix for me.  Pt states his weight today is 185. I advised pt to take his lasix 40 mg today and that I will d/w Dr. [name] as to his weight since he just saw Dr. [name] 10/30. Pt wanted to also let us know that he s/w Social Security yesterday. Pt states SS told him that Dr. [name] would need to document that pt is unable to ever return back to work and that he has been taken out of work permanently. I asked pt was there some paperwork that was going to be sent to Dr. [name]. Pt said yes, he then stated he s/w Dr. Olin Pia nurse yesterday, see phone note 06/02/16 from Alvis Lemmings, RN.

## 2016-06-04 NOTE — Telephone Encounter (Signed)
Dr. Olin Pia office note (06/01/16) faxed to Dr. Ailene Ravel at [phone]. Confirmation received.

## 2016-06-04 NOTE — Telephone Encounter (Signed)
I called [phone] as directed by the patient. This is for [facility] general surgery. I did verify that the patient has seen Dr. Orene Desanctis. Fax # [phone]. I will send Dr. Olin Pia most recent office note to Dr. Ailene Ravel.

## 2016-06-08 ENCOUNTER — Telehealth: Payer: Self-pay | Admitting: Internal Medicine

## 2016-06-08 LAB — CUP PACEART INCLINIC DEVICE CHECK
Date Time Interrogation Session: 20171030202923
HighPow Impedance: 81 Ohm
Implantable Lead Location: 753860
Implantable Lead Model: 181
Implantable Lead Serial Number: 327195
Lead Channel Pacing Threshold Pulse Width: 0.4 ms
Lead Channel Sensing Intrinsic Amplitude: 8.375 mV
MDC IDC LEAD IMPLANT DT: 20150318
MDC IDC MSMT BATTERY REMAINING LONGEVITY: 111 mo
MDC IDC MSMT BATTERY VOLTAGE: 2.97 V
MDC IDC MSMT LEADCHNL RV IMPEDANCE VALUE: 608 Ohm
MDC IDC MSMT LEADCHNL RV IMPEDANCE VALUE: 608 Ohm
MDC IDC MSMT LEADCHNL RV PACING THRESHOLD AMPLITUDE: 0.625 V
MDC IDC MSMT LEADCHNL RV SENSING INTR AMPL: 8.375 mV
MDC IDC PG IMPLANT DT: 20150318
MDC IDC SET LEADCHNL RV PACING AMPLITUDE: 2 V
MDC IDC SET LEADCHNL RV PACING PULSEWIDTH: 0.4 ms
MDC IDC SET LEADCHNL RV SENSING SENSITIVITY: 0.3 mV
MDC IDC STAT BRADY RV PERCENT PACED: 0.11 %

## 2016-06-08 NOTE — Telephone Encounter (Signed)
New message    Patient calling wants to know about the hernia repair- was told he had to wait 3 months wants to know why.

## 2016-06-08 NOTE — Telephone Encounter (Signed)
I spoke with patient and advised him Dr. Olin Pia most recent note advised him to wait 2-4 week.  Heather sent that note to Dr. Jacquenette Shone office.  He states they scheduled his hernia repair for 3 months out. I advised him to contact Dr. Jacquenette Shone office in regards to the sch.  He voiced understanding and states he will call them.  I advised him to contact us if he has further questions. He voiced understanding and thanks.

## 2016-06-09 ENCOUNTER — Ambulatory Visit (INDEPENDENT_AMBULATORY_CARE_PROVIDER_SITE_OTHER): Payer: Medicaid Other | Admitting: Family Medicine

## 2016-06-09 ENCOUNTER — Encounter: Payer: Self-pay | Admitting: Family Medicine

## 2016-06-09 VITALS — BP 122/92 | HR 64 | Temp 99.2°F | Resp 16 | Wt 188.0 lb

## 2016-06-09 DIAGNOSIS — G4700 Insomnia, unspecified: Secondary | ICD-10-CM | POA: Diagnosis not present

## 2016-06-09 MED ORDER — CLONAZEPAM 2 MG PO TABS: ORAL_TABLET | ORAL | 1 refills | Status: DC

## 2016-06-09 MED ORDER — ZOLPIDEM TARTRATE 10 MG PO TABS: 10.0000 mg | ORAL_TABLET | Freq: Every evening | ORAL | 1 refills | Status: DC | PRN

## 2016-06-09 NOTE — Progress Notes (Signed)
Patient: Richard Cook Male    DOB: 30-Nov-1955   60 y.o.   MRN: [id number] Visit Date: 06/09/2016  Today's Provider: Lelon Huh, MD   Chief Complaint  Patient presents with  . Hospitalization Follow-up   Subjective:    HPI  Follow up Hospitalization  Patient was admitted to [facility] on 05/18/2016 and discharged on 05/21/2016. He was treated for Acute Kidney injury superimposed on chronic Kidney disease. Treatment for this included discontinuing Furosemide. Per discharge summary, patient was discharged home and advised close follow up with Cardiology and Nephrology.  He reports good compliance with treatment. He reports this condition is Unchanged. Patient has restarted taking Furosemide every 3 days to help relieve swelling. Patient reports he was seen by his Cardiologist yesterday and has an appointment to see the Nephrologist next week.   His primary concern today is that he is having trouble sleeping a night. He has been taking 3-4 clonazepam every night in addition to melatonin to sleep through the night. He states his cardiologist and nephrologist advised him to wean off of clonazepam He states when he was in the hospital he took one clonazepam about 30 minutes before bed and on Ambien right at bedtime which worked well.  ------------------------------------------------------------------------------------        Allergies  Allergen Reactions  . Eggs Or Egg-Derived Products Hives  . Codeine Nausea Only    Tolerates hydrocodone     Current Outpatient Prescriptions:  .  allopurinol (ZYLOPRIM) 100 MG tablet, Take 1 tablet (100 mg total) by mouth daily., Disp: 30 tablet, Rfl: 0 .  amiodarone (PACERONE) 200 MG tablet, Take 1 tablet (200 mg total) by mouth 2 (two) times daily., Disp: 60 tablet, Rfl: 0 .  amiodarone (PACERONE) 200 MG tablet, Take 2 tablets (400 mg total) by mouth 2 (two) times daily., Disp: 56 tablet, Rfl: 0 .  aspirin EC 81 MG tablet, Take 81 mg  by mouth daily., Disp: , Rfl:  .  atorvastatin (LIPITOR) 20 MG tablet, Take 1 tablet (20 mg total) by mouth daily., Disp: 30 tablet, Rfl: 6 .  clonazePAM (KLONOPIN) 2 MG tablet, TAKE 1/2 TO 1 TABLETS BY MOUTH 3 TIMES A DAY AS NEEDED FOR ANXIETY, Disp: 90 tablet, Rfl: 3 .  furosemide (LASIX) 40 MG tablet, Take 1 tablet (40 mg total) by mouth as directed. Take 1 tab every Friday and every Tuesday, Disp: , Rfl:  .  HYDROcodone-acetaminophen (NORCO) 10-325 MG tablet, Take 1 tablet by mouth every 6 (six) hours as needed for moderate pain., Disp: 60 tablet, Rfl: 0 .  isosorbide mononitrate (IMDUR) 30 MG 24 hr tablet, Take 0.5 tablets (15 mg total) by mouth daily., Disp: 30 tablet, Rfl: 0 .  loperamide (IMODIUM) 2 MG capsule, Take 1-2 capsules (2-4 mg total) by mouth as needed for diarrhea or loose stools., Disp: 30 capsule, Rfl: 0  Review of Systems  Constitutional: Negative for appetite change, chills, diaphoresis, fatigue and fever.  Respiratory: Negative for chest tightness, shortness of breath and wheezing.   Cardiovascular: Positive for leg swelling. Negative for chest pain and palpitations.  Gastrointestinal: Negative for abdominal pain, nausea and vomiting.  Neurological: Negative for dizziness, light-headedness, numbness and headaches.  Psychiatric/Behavioral: Positive for sleep disturbance.    Social History  Substance Use Topics  . Smoking status: Never Smoker  . Smokeless tobacco: Never Used  . Alcohol use No     Comment: quit 30 years ago   Objective:   BP Marland Kitchen)  122/92 (BP Location: Left Arm, Patient Position: Sitting, Cuff Size: Large)   Pulse 64   Temp 99.2 F (37.3 C) (Oral)   Resp 16   Wt 188 lb (85.3 kg)   BMI 26.98 kg/m   Physical Exam   General Appearance:    Alert, cooperative, no distress  Eyes:    PERRL, conjunctiva/corneas clear, EOM's intact       Lungs:     Clear to auscultation bilaterally, respirations unlabored  Heart:    Regular rate and rhythm    Neurologic:   Awake, alert, oriented x 3. No apparent focal neurological           defect.           Assessment & Plan:     1. Insomnia, unspecified type He is currently taking 3x2mg  clonazepam at bedtime and is to immediately reduced to 1x2mg . May take Ambien 30-25minutes later if unable to fall asleep. He is to wean to 1/2 tablet after a few weeks.        Lelon Huh, MD  [facility]

## 2016-06-09 NOTE — Patient Instructions (Signed)
Cut back on clonazepam to 1 tablet 30-60 minutes before going to bed.  After two week reduce dose to 1/2 tablet as needed.

## 2016-06-10 ENCOUNTER — Telehealth: Payer: Self-pay | Admitting: Family Medicine

## 2016-06-10 NOTE — Telephone Encounter (Signed)
Patient unable to find housing would like to discuss other housing option.

## 2016-06-10 NOTE — Telephone Encounter (Signed)
LCSWA received incoming call from pt.   Pt reported that he is currently receiving assistance with housing from church to stay at a local motel. Pt has applied for Medicaid and Disability. He receives food stamps and has a friend who is paying car note for time being.   Pt reported that he received a low-income housing list from [facility] RN Case Managers; however, wait-lists can be 6-12 months long. LCSWA will provide additional resources to assist.

## 2016-06-11 ENCOUNTER — Telehealth: Payer: Self-pay | Admitting: Licensed Clinical Social Worker

## 2016-06-11 NOTE — Telephone Encounter (Signed)
LCSWA received phone call from pt.   Pt reported that he has made contact with all of the community resources for low-income housing that was provided by RN CM and LCSWA. Pt stated that each resource has a wait-list.   LCSWA discussed additional resources (i.e. Solicitor, [employer], [employer], etc). LCSWA offered additional resources on emergency shelter. Pt refused resources stating, "I would not feel safe in a shelter. I would rather stay in my car". LCSWA provided validation and assured pt that she would contact him if anything becomes available.

## 2016-06-17 ENCOUNTER — Encounter: Payer: Self-pay | Admitting: Internal Medicine

## 2016-06-19 ENCOUNTER — Encounter: Payer: Self-pay | Admitting: Internal Medicine

## 2016-06-30 ENCOUNTER — Ambulatory Visit (INDEPENDENT_AMBULATORY_CARE_PROVIDER_SITE_OTHER): Payer: Self-pay | Admitting: Internal Medicine

## 2016-06-30 ENCOUNTER — Encounter: Payer: Self-pay | Admitting: Internal Medicine

## 2016-06-30 VITALS — BP 126/86 | HR 72 | Ht 70.0 in | Wt 191.2 lb

## 2016-06-30 DIAGNOSIS — I472 Ventricular tachycardia, unspecified: Secondary | ICD-10-CM

## 2016-06-30 DIAGNOSIS — N289 Disorder of kidney and ureter, unspecified: Secondary | ICD-10-CM

## 2016-06-30 DIAGNOSIS — I5043 Acute on chronic combined systolic (congestive) and diastolic (congestive) heart failure: Secondary | ICD-10-CM

## 2016-06-30 DIAGNOSIS — I255 Ischemic cardiomyopathy: Secondary | ICD-10-CM

## 2016-06-30 DIAGNOSIS — Z95810 Presence of automatic (implantable) cardiac defibrillator: Secondary | ICD-10-CM

## 2016-06-30 DIAGNOSIS — R001 Bradycardia, unspecified: Secondary | ICD-10-CM

## 2016-06-30 DIAGNOSIS — I5023 Acute on chronic systolic (congestive) heart failure: Secondary | ICD-10-CM

## 2016-06-30 NOTE — Addendum Note (Signed)
Addended by: Alvis Lemmings C on: 06/30/2016 02:51 PM   Modules accepted: Orders
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
## 2016-06-30 NOTE — Patient Instructions (Addendum)
Medication Instructions: Your physician recommends that you continue on your current medications as directed. Please refer to the Current Medication list given to you today.   Labwork: TODAY :BMET  Procedures/Testing: None Ordered  Follow-Up: Remote monitoring is used to monitor your ICD from home. This monitoring reduces the number of office visits required to check your device to one time per year. It allows [place] to keep an eye on the functioning of your device to ensure it is working properly. You are scheduled for a device check from home on 09/29/16.You may send your transmission at any time that day. If you have a wireless device, the transmission will be sent automatically. After your physician reviews your transmission, you will receive a postcard with your next transmission date.  Your physician recommends that you schedule a follow-up appointment in: 3 months with Dr. [name].      Any Additional Special Instructions Will Be Listed Below (If Applicable).     If you need a refill on your cardiac medications before your next appointment, please call your pharmacy.

## 2016-07-01 MED FILL — ATORVASTATIN 20 MG TABLET: 20 | 30 days supply | Qty: 30 | Fill #0

## 2016-07-07 ENCOUNTER — Telehealth: Payer: Self-pay | Admitting: Cardiology
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
## 2016-07-09 LAB — CUP PACEART INCLINIC DEVICE CHECK
Date Time Interrogation Session: 20171128194717
HighPow Impedance: 92 Ohm
Implantable Lead Location: 753860
Implantable Lead Serial Number: 327195
Implantable Pulse Generator Implant Date: 20150318
Lead Channel Pacing Threshold Amplitude: 0.625 V
Lead Channel Pacing Threshold Pulse Width: 0.4 ms
Lead Channel Sensing Intrinsic Amplitude: 8.75 mV
Lead Channel Setting Sensing Sensitivity: 0.3 mV
MDC IDC LEAD IMPLANT DT: 20150318
MDC IDC MSMT BATTERY REMAINING LONGEVITY: 110 mo
MDC IDC MSMT BATTERY VOLTAGE: 3.01 V
MDC IDC MSMT LEADCHNL RV IMPEDANCE VALUE: 646 Ohm
MDC IDC MSMT LEADCHNL RV IMPEDANCE VALUE: 665 Ohm
MDC IDC MSMT LEADCHNL RV SENSING INTR AMPL: 11.125 mV
MDC IDC SET LEADCHNL RV PACING AMPLITUDE: 2 V
MDC IDC SET LEADCHNL RV PACING PULSEWIDTH: 0.4 ms
MDC IDC STAT BRADY RV PERCENT PACED: 0.01 %

## 2016-07-09 MED FILL — ISOSORBIDE MN ER 30 MG TAB: 30 | 30 days supply | Qty: 30 | Fill #0

## 2016-07-13 ENCOUNTER — Other Ambulatory Visit: Payer: Self-pay | Admitting: Internal Medicine

## 2016-07-13 ENCOUNTER — Other Ambulatory Visit: Payer: Self-pay | Admitting: Family Medicine

## 2016-07-13 DIAGNOSIS — M5489 Other dorsalgia: Secondary | ICD-10-CM

## 2016-07-13 MED ORDER — HYDROCODONE-ACETAMINOPHEN 10-325 MG PO TABS: 1.0000 | ORAL_TABLET | Freq: Four times a day (QID) | ORAL | 0 refills | Status: DC | PRN

## 2016-07-13 NOTE — Telephone Encounter (Signed)
Pt contacted office for refill request on the following medications:  HYDROcodone-acetaminophen (NORCO) 10-325 MG tablet.  DE#[phone]/DJ

## 2016-07-14 MED FILL — AMIODARONE HCL 200 MG TAB: 200 | 30 days supply | Qty: 60 | Fill #0

## 2016-07-23 ENCOUNTER — Encounter: Payer: Self-pay | Admitting: Internal Medicine

## 2016-08-12 ENCOUNTER — Other Ambulatory Visit: Payer: Self-pay | Admitting: Family Medicine

## 2016-08-12 DIAGNOSIS — M5489 Other dorsalgia: Secondary | ICD-10-CM

## 2016-08-12 MED ORDER — HYDROCODONE-ACETAMINOPHEN 10-325 MG PO TABS: 1.0000 | ORAL_TABLET | Freq: Four times a day (QID) | ORAL | 0 refills | Status: DC | PRN

## 2016-08-12 NOTE — Telephone Encounter (Signed)
Pt contacted office for refill request on the following medications:  HYDROcodone-acetaminophen (NORCO) 10-325 MG tablet.  EZ#[phone]/DS

## 2016-08-21 ENCOUNTER — Encounter (HOSPITAL_COMMUNITY): Payer: Self-pay | Admitting: Emergency Medicine

## 2016-08-21 DIAGNOSIS — I2510 Atherosclerotic heart disease of native coronary artery without angina pectoris: Secondary | ICD-10-CM | POA: Insufficient documentation

## 2016-08-21 DIAGNOSIS — Z955 Presence of coronary angioplasty implant and graft: Secondary | ICD-10-CM | POA: Insufficient documentation

## 2016-08-21 DIAGNOSIS — K91872 Postprocedural seroma of a digestive system organ or structure following a digestive system procedure: Secondary | ICD-10-CM | POA: Diagnosis not present

## 2016-08-21 DIAGNOSIS — N185 Chronic kidney disease, stage 5: Secondary | ICD-10-CM | POA: Insufficient documentation

## 2016-08-21 DIAGNOSIS — I252 Old myocardial infarction: Secondary | ICD-10-CM | POA: Insufficient documentation

## 2016-08-21 DIAGNOSIS — I132 Hypertensive heart and chronic kidney disease with heart failure and with stage 5 chronic kidney disease, or end stage renal disease: Secondary | ICD-10-CM | POA: Diagnosis not present

## 2016-08-21 DIAGNOSIS — Z7982 Long term (current) use of aspirin: Secondary | ICD-10-CM | POA: Diagnosis not present

## 2016-08-21 DIAGNOSIS — I5042 Chronic combined systolic (congestive) and diastolic (congestive) heart failure: Secondary | ICD-10-CM | POA: Insufficient documentation

## 2016-08-21 DIAGNOSIS — N390 Urinary tract infection, site not specified: Secondary | ICD-10-CM | POA: Insufficient documentation

## 2016-08-21 DIAGNOSIS — R319 Hematuria, unspecified: Secondary | ICD-10-CM | POA: Diagnosis present

## 2016-08-21 LAB — URINALYSIS, ROUTINE W REFLEX MICROSCOPIC
Bilirubin Urine: NEGATIVE
GLUCOSE, UA: NEGATIVE mg/dL
KETONES UR: NEGATIVE mg/dL
Nitrite: NEGATIVE
PROTEIN: 100 mg/dL — AB
Specific Gravity, Urine: 1.015 (ref 1.005–1.030)
Squamous Epithelial / HPF: NONE SEEN
pH: 6 (ref 5.0–8.0)

## 2016-08-21 LAB — BASIC METABOLIC PANEL
Anion gap: 9 (ref 5–15)
BUN: 17 mg/dL (ref 6–20)
CALCIUM: 9.6 mg/dL (ref 8.9–10.3)
CO2: 28 mmol/L (ref 22–32)
CREATININE: 2.29 mg/dL — AB (ref 0.61–1.24)
Chloride: 102 mmol/L (ref 101–111)
GFR calc non Af Amer: 30 mL/min — ABNORMAL LOW (ref >60)
GFR, EST AFRICAN AMERICAN: 34 mL/min — AB (ref >60)
Glucose, Bld: 107 mg/dL — ABNORMAL HIGH (ref 65–99)
Potassium: 5 mmol/L (ref 3.5–5.1)
SODIUM: 139 mmol/L (ref 135–145)

## 2016-08-21 LAB — CBC
HCT: 44.7 % (ref 39.0–52.0)
Hemoglobin: 14.3 g/dL (ref 13.0–17.0)
MCH: 32.5 pg (ref 26.0–34.0)
MCHC: 32 g/dL (ref 30.0–36.0)
MCV: 101.6 fL — ABNORMAL HIGH (ref 78.0–100.0)
Platelets: 182 10*3/uL (ref 150–400)
RBC: 4.4 MIL/uL (ref 4.22–5.81)
RDW: 13.8 % (ref 11.5–15.5)
WBC: 9.8 10*3/uL (ref 4.0–10.5)

## 2016-08-21 NOTE — ED Triage Notes (Signed)
Pt presents to ED for assessment after being diagnosed with an inguinal hernia,  Pt sts currently being followed up with by PCP.  PT sts PCP said possible need to drain fluid.  Pt sts today he noted bloody urine x 2.  Denies any increase in pain or recent trauma.

## 2016-08-22 ENCOUNTER — Emergency Department (HOSPITAL_COMMUNITY)
Admission: EM | Admit: 2016-08-22 | Discharge: 2016-08-22 | Disposition: A | Discharge status: Home / Self Care | Payer: Medicaid Other | Attending: Emergency Medicine | Admitting: Emergency Medicine

## 2016-08-22 DIAGNOSIS — R319 Hematuria, unspecified: Secondary | ICD-10-CM

## 2016-08-22 DIAGNOSIS — N390 Urinary tract infection, site not specified: Secondary | ICD-10-CM

## 2016-08-22 DIAGNOSIS — S301XXD Contusion of abdominal wall, subsequent encounter: Secondary | ICD-10-CM

## 2016-08-22 MED ORDER — ONDANSETRON 4 MG PO TBDP
8.0000 mg | ORAL_TABLET | Freq: Once | ORAL | Status: AC
  Administered 2016-08-22: 8 mg via ORAL
  Filled 2016-08-22: qty 2

## 2016-08-22 MED ORDER — ONDANSETRON HCL 4 MG PO TABS: 4.0000 mg | ORAL_TABLET | Freq: Three times a day (TID) | ORAL | 0 refills | Status: DC | PRN

## 2016-08-22 MED ORDER — CEPHALEXIN 500 MG PO CAPS: 500.0000 mg | ORAL_CAPSULE | Freq: Three times a day (TID) | ORAL | 0 refills | Status: DC

## 2016-08-22 MED ORDER — CEPHALEXIN 250 MG PO CAPS
500.0000 mg | ORAL_CAPSULE | Freq: Once | ORAL | Status: AC
  Administered 2016-08-22: 500 mg via ORAL
  Filled 2016-08-22: qty 2

## 2016-08-22 NOTE — ED Notes (Signed)
MD notified of patients request about being discharged

## 2016-08-22 NOTE — Discharge Instructions (Signed)
Drink plenty of fluids. Take the antibiotics until gone. Use the zofran for nausea or vomiting. Recheck if you get a high fever, worsening flank pain, or get uncontrolled vomiting. Keep your appointment at [facility] about the seroma in your right groin. Recheck sooner if it gets more swollen.
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
## 2016-08-22 NOTE — ED Notes (Signed)
Pt understood dc material. NAD noted. Scripts given. Pt in a hurry to leave and did not sign

## 2016-08-24 LAB — URINE CULTURE

## 2016-08-25 ENCOUNTER — Telehealth (HOSPITAL_BASED_OUTPATIENT_CLINIC_OR_DEPARTMENT_OTHER): Payer: Self-pay | Admitting: Emergency Medicine

## 2016-08-25 NOTE — Progress Notes (Signed)
ED Antimicrobial Stewardship Positive Culture Follow Up   Richard Cook is an 61 y.o. male who presented to [facility] on 08/22/2016 with a chief complaint of  Chief Complaint  Patient presents with  . Inguinal Hernia  . Hematuria    Recent Results (from the past 720 hour(s))  Urine culture     Status: Abnormal   Collection Time: 08/22/16  2:09 AM  Result Value Ref Range Status   Specimen Description URINE, RANDOM  Final   Special Requests NONE  Final   Culture >=100,000 COLONIES/mL ENTEROBACTER AEROGENES (A)  Final   Report Status 08/24/2016 FINAL  Final   Organism ID, Bacteria ENTEROBACTER AEROGENES (A)  Final      Susceptibility   Enterobacter aerogenes - MIC*    CEFAZOLIN 8 RESISTANT Resistant     CEFTRIAXONE <=1 SENSITIVE Sensitive     CIPROFLOXACIN <=0.25 SENSITIVE Sensitive     GENTAMICIN <=1 SENSITIVE Sensitive     IMIPENEM 0.5 SENSITIVE Sensitive     NITROFURANTOIN 64 INTERMEDIATE Intermediate     TRIMETH/SULFA <=20 SENSITIVE Sensitive     PIP/TAZO 16 SENSITIVE Sensitive     * >=100,000 COLONIES/mL ENTEROBACTER AEROGENES    [x]  Treated with cephalexin, organism resistant to prescribed antimicrobial  New antibiotic prescription: Bactrim 400mg /80mg - Take one tablet by mouth twice daily for 7 days   ED Provider: Carlisle Cater, PA-C  Stark Klein, PharmD Clinical Pharmacy Resident [phone] (Pager) 08/25/2016 9:37 AM

## 2016-08-25 NOTE — Telephone Encounter (Signed)
Post ED Visit - Positive Culture Follow-up: Successful Patient Follow-Up  Culture assessed and recommendations reviewed by: []  Elenor Quinones, Pharm.D. []  Heide Guile, Pharm.D., BCPS []  Parks Neptune, Pharm.D. []  Alycia Rossetti, Pharm.D., BCPS []  [place], Pharm.D., BCPS, AAHIVP []  Legrand Como, Pharm.D., BCPS, AAHIVP []  Milus Glazier, Pharm.D. []  Stephens November, Pharm.D. Joe Arminger PharmD  Positive urine culture  []  Patient discharged without antimicrobial prescription and treatment is now indicated [x]  Organism is resistant to prescribed ED discharge antimicrobial []  Patient with positive blood cultures  Changes discussed with ED provider: Carlisle Cater PA New antibiotic prescription d/c cephalexin, start Bactrim single strength bid x 7 days  Attempting to contact patient     Hazle Nordmann 08/25/2016, 1:21 PM

## 2016-08-31 MED FILL — ATORVASTATIN 20 MG TABLET: 20 | 30 days supply | Qty: 30 | Fill #1

## 2016-09-05 ENCOUNTER — Encounter (HOSPITAL_COMMUNITY): Payer: Self-pay | Admitting: Emergency Medicine

## 2016-09-05 ENCOUNTER — Observation Stay (HOSPITAL_COMMUNITY)
Admission: EM | Admit: 2016-09-05 | Discharge: 2016-09-06 | Disposition: A | Discharge status: Home / Self Care | Payer: Medicaid Other | Attending: Internal Medicine | Admitting: Internal Medicine

## 2016-09-05 DIAGNOSIS — Z7982 Long term (current) use of aspirin: Secondary | ICD-10-CM | POA: Insufficient documentation

## 2016-09-05 DIAGNOSIS — I252 Old myocardial infarction: Secondary | ICD-10-CM | POA: Insufficient documentation

## 2016-09-05 DIAGNOSIS — Z95810 Presence of automatic (implantable) cardiac defibrillator: Secondary | ICD-10-CM | POA: Diagnosis not present

## 2016-09-05 DIAGNOSIS — N183 Chronic kidney disease, stage 3 unspecified: Secondary | ICD-10-CM | POA: Diagnosis present

## 2016-09-05 DIAGNOSIS — I472 Ventricular tachycardia, unspecified: Secondary | ICD-10-CM

## 2016-09-05 DIAGNOSIS — Z955 Presence of coronary angioplasty implant and graft: Secondary | ICD-10-CM | POA: Diagnosis not present

## 2016-09-05 DIAGNOSIS — I130 Hypertensive heart and chronic kidney disease with heart failure and stage 1 through stage 4 chronic kidney disease, or unspecified chronic kidney disease: Secondary | ICD-10-CM | POA: Diagnosis not present

## 2016-09-05 DIAGNOSIS — I5043 Acute on chronic combined systolic (congestive) and diastolic (congestive) heart failure: Secondary | ICD-10-CM | POA: Diagnosis not present

## 2016-09-05 DIAGNOSIS — Z79899 Other long term (current) drug therapy: Secondary | ICD-10-CM | POA: Diagnosis not present

## 2016-09-05 DIAGNOSIS — K219 Gastro-esophageal reflux disease without esophagitis: Secondary | ICD-10-CM | POA: Diagnosis present

## 2016-09-05 DIAGNOSIS — I2510 Atherosclerotic heart disease of native coronary artery without angina pectoris: Secondary | ICD-10-CM | POA: Diagnosis not present

## 2016-09-05 DIAGNOSIS — I255 Ischemic cardiomyopathy: Secondary | ICD-10-CM | POA: Diagnosis present

## 2016-09-05 DIAGNOSIS — I5042 Chronic combined systolic (congestive) and diastolic (congestive) heart failure: Secondary | ICD-10-CM | POA: Diagnosis present

## 2016-09-05 DIAGNOSIS — Z951 Presence of aortocoronary bypass graft: Secondary | ICD-10-CM | POA: Insufficient documentation

## 2016-09-05 DIAGNOSIS — R000 Tachycardia, unspecified: Secondary | ICD-10-CM | POA: Diagnosis present

## 2016-09-05 DIAGNOSIS — I10 Essential (primary) hypertension: Secondary | ICD-10-CM | POA: Diagnosis present

## 2016-09-05 LAB — CBC WITH DIFFERENTIAL/PLATELET
BASOS ABS: 0 10*3/uL (ref 0.0–0.1)
BASOS PCT: 0 %
Eosinophils Absolute: 0.2 10*3/uL (ref 0.0–0.7)
Eosinophils Relative: 2 %
HCT: 47.9 % (ref 39.0–52.0)
HEMOGLOBIN: 15.4 g/dL (ref 13.0–17.0)
Lymphocytes Relative: 17 %
Lymphs Abs: 1.8 10*3/uL (ref 0.7–4.0)
MCH: 32.6 pg (ref 26.0–34.0)
MCHC: 32.2 g/dL (ref 30.0–36.0)
MCV: 101.5 fL — ABNORMAL HIGH (ref 78.0–100.0)
MONO ABS: 0.7 10*3/uL (ref 0.1–1.0)
MONOS PCT: 7 %
NEUTROS ABS: 7.8 10*3/uL — AB (ref 1.7–7.7)
NEUTROS PCT: 74 %
Platelets: 189 10*3/uL (ref 150–400)
RBC: 4.72 MIL/uL (ref 4.22–5.81)
RDW: 14 % (ref 11.5–15.5)
WBC: 10.5 10*3/uL (ref 4.0–10.5)

## 2016-09-05 LAB — URINALYSIS, ROUTINE W REFLEX MICROSCOPIC
Bilirubin Urine: NEGATIVE
Glucose, UA: NEGATIVE mg/dL
KETONES UR: NEGATIVE mg/dL
Nitrite: NEGATIVE
PH: 6 (ref 5.0–8.0)
PROTEIN: NEGATIVE mg/dL
SQUAMOUS EPITHELIAL / LPF: NONE SEEN
Specific Gravity, Urine: 1.005 (ref 1.005–1.030)

## 2016-09-05 LAB — TROPONIN I: Troponin I: <0.03 ng/mL (ref <0.03)

## 2016-09-05 LAB — BASIC METABOLIC PANEL
Anion gap: 9 (ref 5–15)
BUN: 27 mg/dL — ABNORMAL HIGH (ref 6–20)
CALCIUM: 9.6 mg/dL (ref 8.9–10.3)
CO2: 26 mmol/L (ref 22–32)
CREATININE: 2.57 mg/dL — AB (ref 0.61–1.24)
Chloride: 107 mmol/L (ref 101–111)
GFR, EST AFRICAN AMERICAN: 30 mL/min — AB (ref >60)
GFR, EST NON AFRICAN AMERICAN: 26 mL/min — AB (ref >60)
Glucose, Bld: 98 mg/dL (ref 65–99)
Potassium: 5.2 mmol/L — ABNORMAL HIGH (ref 3.5–5.1)
SODIUM: 142 mmol/L (ref 135–145)

## 2016-09-05 LAB — GLUCOSE, CAPILLARY: Glucose-Capillary: 92 mg/dL (ref 65–99)

## 2016-09-05 LAB — MAGNESIUM: MAGNESIUM: 2 mg/dL (ref 1.7–2.4)

## 2016-09-05 MED ORDER — ISOSORBIDE MONONITRATE ER 30 MG PO TB24: 30.0000 mg | ORAL_TABLET | Freq: Every day | ORAL | Status: DC

## 2016-09-05 MED ORDER — AMIODARONE HCL 200 MG PO TABS
400.0000 mg | ORAL_TABLET | Freq: Two times a day (BID) | ORAL | Status: DC
  Administered 2016-09-05 – 2016-09-06 (×3): 400 mg via ORAL
  Filled 2016-09-05 (×3): qty 2

## 2016-09-05 MED ORDER — ETOMIDATE 2 MG/ML IV SOLN
INTRAVENOUS | Status: AC | PRN
  Administered 2016-09-05: 7 mg via INTRAVENOUS

## 2016-09-05 MED ORDER — AMIODARONE HCL IN DEXTROSE 360-4.14 MG/200ML-% IV SOLN
60.0000 mg/h | INTRAVENOUS | Status: AC
  Administered 2016-09-05: 60 mg/h via INTRAVENOUS
  Filled 2016-09-05: qty 200

## 2016-09-05 MED ORDER — HYDROCODONE-ACETAMINOPHEN 10-325 MG PO TABS
1.0000 | ORAL_TABLET | Freq: Four times a day (QID) | ORAL | Status: DC | PRN
  Administered 2016-09-05 – 2016-09-06 (×2): 1 via ORAL
  Filled 2016-09-05 (×2): qty 1

## 2016-09-05 MED ORDER — DEXTROSE 5 % IV SOLN
1.0000 g | INTRAVENOUS | Status: DC
  Administered 2016-09-05 – 2016-09-06 (×2): 1 g via INTRAVENOUS
  Filled 2016-09-05 (×2): qty 10

## 2016-09-05 MED ORDER — NITROGLYCERIN 0.4 MG SL SUBL: 0.4000 mg | SUBLINGUAL_TABLET | SUBLINGUAL | Status: DC | PRN

## 2016-09-05 MED ORDER — CLONAZEPAM 0.5 MG PO TABS
2.0000 mg | ORAL_TABLET | Freq: Every day | ORAL | Status: DC
  Administered 2016-09-05: 2 mg via ORAL
  Filled 2016-09-05: qty 4

## 2016-09-05 MED ORDER — SODIUM CHLORIDE 0.9 % IV SOLN
INTRAVENOUS | Status: DC
  Administered 2016-09-05: 21:00:00 via INTRAVENOUS
  Administered 2016-09-05: 20 mL/h via INTRAVENOUS

## 2016-09-05 MED ORDER — ETOMIDATE 2 MG/ML IV SOLN
10.0000 mg | Freq: Once | INTRAVENOUS | Status: DC
  Filled 2016-09-05: qty 10

## 2016-09-05 MED ORDER — ONDANSETRON HCL 4 MG/2ML IJ SOLN: 4.0000 mg | Freq: Four times a day (QID) | INTRAMUSCULAR | Status: DC | PRN

## 2016-09-05 MED ORDER — AMIODARONE IV BOLUS ONLY 150 MG/100ML
150.0000 mg | Freq: Once | INTRAVENOUS | Status: AC
  Administered 2016-09-05: 150 mg via INTRAVENOUS

## 2016-09-05 MED ORDER — HEPARIN SODIUM (PORCINE) 5000 UNIT/ML IJ SOLN
5000.0000 [IU] | Freq: Three times a day (TID) | INTRAMUSCULAR | Status: DC
  Administered 2016-09-05 – 2016-09-06 (×2): 5000 [IU] via SUBCUTANEOUS
  Filled 2016-09-05 (×2): qty 1

## 2016-09-05 MED ORDER — ISOSORBIDE MONONITRATE ER 30 MG PO TB24
30.0000 mg | ORAL_TABLET | Freq: Every day | ORAL | Status: DC
  Administered 2016-09-05 – 2016-09-06 (×2): 30 mg via ORAL
  Filled 2016-09-05 (×2): qty 1

## 2016-09-05 MED ORDER — SODIUM CHLORIDE 0.9 % IV BOLUS (SEPSIS)
250.0000 mL | Freq: Once | INTRAVENOUS | Status: AC
  Administered 2016-09-05: 250 mL via INTRAVENOUS

## 2016-09-05 MED ORDER — ASPIRIN EC 81 MG PO TBEC
81.0000 mg | DELAYED_RELEASE_TABLET | Freq: Every day | ORAL | Status: DC
  Administered 2016-09-06: 81 mg via ORAL
  Filled 2016-09-05: qty 1

## 2016-09-05 MED ORDER — ATORVASTATIN CALCIUM 20 MG PO TABS
20.0000 mg | ORAL_TABLET | Freq: Every day | ORAL | Status: DC
  Administered 2016-09-05: 20 mg via ORAL
  Filled 2016-09-05 (×2): qty 1

## 2016-09-05 MED ORDER — ACETAMINOPHEN 325 MG PO TABS
650.0000 mg | ORAL_TABLET | ORAL | Status: DC | PRN
  Filled 2016-09-05: qty 2

## 2016-09-05 MED ORDER — AMIODARONE HCL IN DEXTROSE 360-4.14 MG/200ML-% IV SOLN: 60.0000 mg/h | Freq: Once | INTRAVENOUS | Status: DC

## 2016-09-05 MED ORDER — OMEGA-3-ACID ETHYL ESTERS 1 G PO CAPS
1.0000 g | ORAL_CAPSULE | Freq: Every day | ORAL | Status: DC
  Administered 2016-09-05 – 2016-09-06 (×2): 1 g via ORAL
  Filled 2016-09-05 (×2): qty 1

## 2016-09-05 NOTE — Sedation Documentation (Signed)
Repeat EKG given to Dr. Zenia Resides
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
## 2016-09-05 NOTE — ED Notes (Signed)
[employer] rep and cardiologist attempted to pace patient out of rhythm. Unsuccessful. Will talk about cardioversion.

## 2016-09-05 NOTE — Sedation Documentation (Addendum)
Airway cart, suction, ambu bad, co2 monitoring at bedside. Respiratory at bedside

## 2016-09-05 NOTE — ED Notes (Signed)
Attempted report 
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
## 2016-09-05 NOTE — ED Notes (Signed)
Pt HR continues to read Vtach. Cardiology at bedside.

## 2016-09-05 NOTE — ED Notes (Signed)
Pt HR is brady, 59, and hypotensive,  Per Dr. Zenia Resides, to turn off amiodarone and page cardiology. Cardiology paged

## 2016-09-05 NOTE — Sedation Documentation (Signed)
[employer] rep shocked pt with 20J. Pt back into sinus rhythm

## 2016-09-05 NOTE — ED Notes (Signed)
Waiting for cardiology to be at bedside for cardioversion

## 2016-09-05 NOTE — ED Notes (Signed)
Cardiology at bedside.

## 2016-09-05 NOTE — ED Notes (Signed)
Dinner tray ordered, pt requesting grilled cheese with bacon, this Rn informed pt this was not possible d/t being on Heart health diet

## 2016-09-05 NOTE — ED Triage Notes (Signed)
Pt c/o burning with urination onset today. Pt has history of UTI and today's symptoms are similar.

## 2016-09-06 ENCOUNTER — Observation Stay (HOSPITAL_BASED_OUTPATIENT_CLINIC_OR_DEPARTMENT_OTHER): Payer: Medicaid Other

## 2016-09-06 DIAGNOSIS — I249 Acute ischemic heart disease, unspecified: Secondary | ICD-10-CM

## 2016-09-06 LAB — ECHOCARDIOGRAM COMPLETE
Height: 70 in
Weight: 3008 oz

## 2016-09-06 LAB — BASIC METABOLIC PANEL
Anion gap: 11 (ref 5–15)
BUN: 28 mg/dL — AB (ref 6–20)
CALCIUM: 8.9 mg/dL (ref 8.9–10.3)
CO2: 27 mmol/L (ref 22–32)
CREATININE: 2.67 mg/dL — AB (ref 0.61–1.24)
Chloride: 103 mmol/L (ref 101–111)
GFR calc Af Amer: 28 mL/min — ABNORMAL LOW (ref >60)
GFR, EST NON AFRICAN AMERICAN: 25 mL/min — AB (ref >60)
Glucose, Bld: 84 mg/dL (ref 65–99)
Potassium: 4 mmol/L (ref 3.5–5.1)
Sodium: 141 mmol/L (ref 135–145)

## 2016-09-06 LAB — URINE CULTURE

## 2016-09-06 LAB — GLUCOSE, CAPILLARY: GLUCOSE-CAPILLARY: 77 mg/dL (ref 65–99)

## 2016-09-06 LAB — LIPID PANEL
CHOL/HDL RATIO: 4.6 ratio
Cholesterol: 152 mg/dL (ref 0–200)
HDL: 33 mg/dL — AB (ref >40)
LDL CALC: 92 mg/dL (ref 0–99)
TRIGLYCERIDES: 134 mg/dL (ref <150)
VLDL: 27 mg/dL (ref 0–40)

## 2016-09-06 LAB — TROPONIN I: Troponin I: <0.03 ng/mL (ref <0.03)

## 2016-09-06 LAB — MRSA PCR SCREENING: MRSA by PCR: NEGATIVE

## 2016-09-06 MED ORDER — NITROGLYCERIN 0.4 MG SL SUBL: 0.4000 mg | SUBLINGUAL_TABLET | SUBLINGUAL | 3 refills | Status: DC | PRN

## 2016-09-06 MED ORDER — CEPHALEXIN 500 MG PO CAPS: 500.0000 mg | ORAL_CAPSULE | Freq: Three times a day (TID) | ORAL | 0 refills | Status: DC

## 2016-09-06 MED ORDER — AMIODARONE HCL 200 MG PO TABS: ORAL_TABLET | ORAL | 3 refills | Status: DC

## 2016-09-06 NOTE — Discharge Summary (Signed)
Discharge Summary    Patient ID: Richard Cook,  MRN: [id number], DOB/AGE: 60-22-1957 60 y.o.  Admit date: 09/05/2016 Discharge date: 09/06/2016  Primary Care Provider: Lelon Huh Primary Cardiologist: G. Lovena Le, MD   Discharge Diagnoses    Principal Problem:   Ventricular tachycardia ([place])  **s/p DCCV on 2/3.  Active Problems:   Cardiomyopathy, ischemic   Chronic combined systolic and diastolic CHF (congestive heart failure) ([facility])   CAD in native artery   Essential (primary) hypertension   Chronic kidney disease (CKD), stage III (moderate)   GERD (gastroesophageal reflux disease)  Allergies Allergies  Allergen Reactions  . Eggs Or Egg-Derived Products Hives  . Codeine Other (See Comments)    Tolerates hydrocodone    Diagnostic Studies/Procedures    2D Echocardiogram 2.4.208  **Read pending** _____________   History of Present Illness     61 year old male with a complex past medical history including coronary artery disease status post prior inferolateral STEMI in January 2013 with stenting of the proximal right coronary artery. In April 2014, he underwent CABG 3 in [place]. Last catheterization in 2015 showed patency of all 3 of his grafts, while stress testing in 2016 was nonischemic. He also has a history of chronic combined systolic and diastolic CHF with ischemic cardiomyopathy myopathy. EF at one point was as low as 30-35%. He is status post ICD placement. Other history includes hypertension, hyperlipidemia, stage III chronic kidney disease, and ventricular tachycardia. Patient was admitted in the fall of 2017 with recurrent VT and ICD shock 2. He was placed on amiodarone therapy and had been doing well. Earlier this month, he underwent hernia repair at [facility]. Postoperatively, he developed a urinary tract infection in the setting of a Foley catheter. He recently completed a 10 day course of antibiotics and noted some darkening of his urine yesterday  with subsequent dysuria when he got up on the morning of admission. Because of dysuria, he presented to the ED for evaluation of what he presumed to be a urinary tract infection. Once here, he was noted be tachycardic and ECG showed ventricular tachycardia.  He was completely asymptomatic. He was placed on IV amiodarone and the [employer] representative was called in and an attempt was made to pace him out of ventricular tachycardia, however this was unsuccessful. He was subsequently sedated and cardioverted with a 20 J defibrillation via his device. This was successful in restoring sinus rhythm.  Hospital Course     Consultants: None   Following admission, patient remained in sinus rhythm without further evidence of VT.  He remained on IV amiodarone overnight and will be converted to oral amiodarone this morning.  He had previously been on 200 mg twice a day while at home. We will increase this to 400 twice a day over the next 4 days and then he will drop back down to 200 twice a day. Of note, his urinalysis was also abnormal. He was treated with Rocephin while in the hospital and we will repeat right him for cephalexin 500 mg 3 times a day 14 days. Suspect he may have some prostatitis. We have arranged for follow-up in electrophysiology clinic within the next 2 weeks. Patient may be considered for VT ablation in the future. Mr. Sarin will be discharged home today in good condition. _____________  Discharge Vitals Blood pressure 92/67, pulse (!) 59, temperature 98.1 F (36.7 C), temperature source Oral, resp. rate (!) 9, height 5\' 10"  (1.778 m), weight 188 lb (85.3 kg),  SpO2 92 %.  Filed Weights   09/05/16 1050  Weight: 188 lb (85.3 kg)    Labs & Radiologic Studies    CBC  Recent Labs  09/05/16 1124  WBC 10.5  NEUTROABS 7.8*  HGB 15.4  HCT 47.9  MCV 101.5*  PLT 810   Basic Metabolic Panel  Recent Labs  09/05/16 1124 09/05/16 1614 09/06/16 0216  NA 142  --  141  K 5.2*  --   4.0  CL 107  --  103  CO2 26  --  27  GLUCOSE 98  --  84  BUN 27*  --  28*  CREATININE 2.57*  --  2.67*  CALCIUM 9.6  --  8.9  MG  --  2.0  --    Cardiac Enzymes  Recent Labs  09/05/16 1614 09/05/16 2013 09/06/16 0216  TROPONINI <0.03 <0.03 <0.03   Fasting Lipid Panel  Recent Labs  09/06/16 0216  CHOL 152  HDL 33*  LDLCALC 92  TRIG 134  CHOLHDL 4.6  _____________  No results found. Disposition   Pt is being discharged home today in good condition.  Follow-up Plans & Appointments    Follow-up Information    [name] Jamaica, PA-C Follow up on 09/16/2016.   Specialty:  Cardiology Why:  10:00 AM - Dr. Olin Pia PA Contact information: [address] [place] [address] [place] [address] [phone]            Discharge Medications   Current Discharge Medication List    START taking these medications   Details  nitroGLYCERIN (NITROSTAT) 0.4 MG SL tablet Place 1 tablet (0.4 mg total) under the tongue every 5 (five) minutes x 3 doses as needed for chest pain. Qty: 25 tablet, Refills: 3      CONTINUE these medications which have CHANGED   Details  amiodarone (PACERONE) 200 MG tablet 2 tabs twice daily x 4 days and then reduce to 1 tab twice daily. Qty: 180 tablet, Refills: 3    cephALEXin (KEFLEX) 500 MG capsule Take 1 capsule (500 mg total) by mouth 3 (three) times daily. Qty: 42 capsule, Refills: 0      CONTINUE these medications which have NOT CHANGED   Details  aspirin EC 81 MG tablet Take 81 mg by mouth daily.    atorvastatin (LIPITOR) 20 MG tablet Take 1 tablet (20 mg total) by mouth daily. Qty: 30 tablet, Refills: 6    clonazePAM (KLONOPIN) 2 MG tablet Take 1/2-1 tablet as needed to help sleep, no more than one per day Qty: 1 tablet, Refills: 1    furosemide (LASIX) 40 MG tablet Take 1 tablet (40 mg total) by mouth as directed. Take 1 tab every Friday and every Tuesday    HYDROcodone-acetaminophen (NORCO) 10-325 MG tablet Take 1 tablet by  mouth every 6 (six) hours as needed for moderate pain. Qty: 60 tablet, Refills: 0   Associated Diagnoses: Back pain without sciatica    isosorbide mononitrate (IMDUR) 30 MG 24 hr tablet Take 0.5 tablets (15 mg total) by mouth daily. Qty: 30 tablet, Refills: 0    Omega-3 Fatty Acids (FISH OIL PO) Take 1 capsule by mouth daily.    OVER THE COUNTER MEDICATION Take 1 each by mouth daily as needed (for constipation). Purex    loperamide (IMODIUM) 2 MG capsule Take 1-2 capsules (2-4 mg total) by mouth as needed for diarrhea or loose stools. Qty: 30 capsule, Refills: 0    ondansetron (ZOFRAN) 4 MG tablet  Take 1 tablet (4 mg total) by mouth every 8 (eight) hours as needed for nausea or vomiting. Qty: 12 tablet, Refills: 0      STOP taking these medications     allopurinol (ZYLOPRIM) 100 MG tablet      zolpidem (AMBIEN) 10 MG tablet           Outstanding Labs/Studies   None  Duration of Discharge Encounter   Greater than 30 minutes including physician time.  Signed, Murray Hodgkins NP 09/06/2016, 12:49 PM  EP Attending  Patient seen and examined. Agree with the findings as noted above. The patient has remained in NSR and is stable for DC on the higher dose of amiodarone. Will followup with Dr. Renaldo Reel. I will discuss catheter ablation of his VT with him.  Mikle Bosworth.D.

## 2016-09-06 NOTE — Progress Notes (Signed)
Assessed pt (see charting). AAOx4. No chest pain or SOB noted and verbalized.  2100: Called Dr. Radford Pax per pt's request of restarting home med (norco). Dr. Radford Pax ordered Norco 10-325 q6h prn for moderate pain.  Reassessed pt after med administration. Pt was asleep and appeared comfortable. Vital signs WNL. 0400: Complained of 10/10 left flank pain. Pt verbalized that his "kidneys are hurting", and that doctors are not doing anything about it, as well as with his creatinine level. Relayed info to Dr. Radford Pax. No further orders received.

## 2016-09-06 NOTE — Progress Notes (Signed)
Patient Name: Richard Cook Date of Encounter: 09/06/2016     Active Problems:   Ventricular tachycardia ([place])    SUBJECTIVE  No chest pain or sob. No palpitations. Still some dysuria  CURRENT MEDS . amiodarone  400 mg Oral BID  . aspirin EC  81 mg Oral Daily  . atorvastatin  20 mg Oral Daily  . cefTRIAXone (ROCEPHIN)  IV  1 g Intravenous Q24H  . clonazePAM  2 mg Oral QHS  . heparin  5,000 Units Subcutaneous Q8H  . isosorbide mononitrate  30 mg Oral Daily  . omega-3 acid ethyl esters  1 g Oral Daily    OBJECTIVE  Vitals:   09/05/16 2330 09/06/16 0122 09/06/16 0410 09/06/16 0837  BP: 96/68 109/71 101/73 92/69  Pulse:   (!) 59 62  Resp:   12 11  Temp:   97.8 F (36.6 C) 98.1 F (36.7 C)  TempSrc:   Oral Oral  SpO2:   93% 94%  Weight:      Height:        Intake/Output Summary (Last 24 hours) at 09/06/16 1033 Last data filed at 09/06/16 0800  Gross per 24 hour  Intake           791.33 ml  Output             2300 ml  Net         -1508.67 ml   Filed Weights   09/05/16 1050  Weight: 188 lb (85.3 kg)    PHYSICAL EXAM  General: Pleasant, NAD. Neuro: Alert and oriented X 3. Moves all extremities spontaneously. Psych: Normal affect. HEENT:  Normal  Neck: Supple without bruits or JVD. Lungs:  Resp regular and unlabored, CTA. Heart: RRR no s3, s4, or murmurs, median sternotomy Abdomen: Soft, non-tender, non-distended, BS + x 4.  Extremities: No clubbing, cyanosis or edema. DP/PT/Radials 2+ and equal bilaterally.  Accessory Clinical Findings  CBC  Recent Labs  09/05/16 1124  WBC 10.5  NEUTROABS 7.8*  HGB 15.4  HCT 47.9  MCV 101.5*  PLT 381   Basic Metabolic Panel  Recent Labs  09/05/16 1124 09/05/16 1614 09/06/16 0216  NA 142  --  141  K 5.2*  --  4.0  CL 107  --  103  CO2 26  --  27  GLUCOSE 98  --  84  BUN 27*  --  28*  CREATININE 2.57*  --  2.67*  CALCIUM 9.6  --  8.9  MG  --  2.0  --    Liver Function Tests No results for  input(s): AST, ALT, ALKPHOS, BILITOT, PROT, ALBUMIN in the last 72 hours. No results for input(s): LIPASE, AMYLASE in the last 72 hours. Cardiac Enzymes  Recent Labs  09/05/16 1614 09/05/16 2013 09/06/16 0216  TROPONINI <0.03 <0.03 <0.03   BNP Invalid input(s): POCBNP D-Dimer No results for input(s): DDIMER in the last 72 hours. Hemoglobin A1C No results for input(s): HGBA1C in the last 72 hours. Fasting Lipid Panel  Recent Labs  09/06/16 0216  CHOL 152  HDL 33*  LDLCALC 92  TRIG 134  CHOLHDL 4.6   Thyroid Function Tests No results for input(s): TSH, T4TOTAL, T3FREE, THYROIDAB in the last 72 hours.  Invalid input(s): FREET3  TELE  nsr  Radiology/Studies  No results found.  ASSESSMENT AND PLAN  1. Sustained VT - his VT has not come back since DCCV yesterday. He will be discharged home on the amio regimen as  outlined by Dr. Aundra Dubin in his note yesterday. I will discuss VT ablation with Dr. Renaldo Reel 2. UTI - I suspect he has some prostatitis. He developed a UTI after having a foley catheter in place, was treated for 10 days and 2 days later (yesterday) developed recurrent symptoms. We will dc home on another 14 days of keflex 500 mg tid 3. ICM - he denies anginal symptoms. Will follow.  Carleene Overlie Taylor,M.D.  09/06/2016 10:33 AMPatient ID: Richard Cook, male   DOB: Apr 27, 1956, 61 y.o.   MRN: [id number]

## 2016-09-06 NOTE — Discharge Instructions (Signed)
***  PLEASE REMEMBER TO BRING ALL OF YOUR MEDICATIONS TO EACH OF YOUR FOLLOW-UP OFFICE VISITS.  

## 2016-09-11 ENCOUNTER — Other Ambulatory Visit: Payer: Self-pay | Admitting: Family Medicine

## 2016-09-11 DIAGNOSIS — M5489 Other dorsalgia: Secondary | ICD-10-CM

## 2016-09-11 MED ORDER — HYDROCODONE-ACETAMINOPHEN 10-325 MG PO TABS: 1.0000 | ORAL_TABLET | Freq: Four times a day (QID) | ORAL | 0 refills | Status: DC | PRN

## 2016-09-11 MED ORDER — CLONAZEPAM 2 MG PO TABS: 2.0000 mg | ORAL_TABLET | Freq: Every day | ORAL | 3 refills | Status: DC

## 2016-09-11 NOTE — Telephone Encounter (Signed)
Rx called in to pharmacy. 

## 2016-09-11 NOTE — Telephone Encounter (Signed)
Pt needs refill   HYDROcodone-acetaminophen (NORCO) 10-325 MG tablet  09/05/2016 08/12/16 -- Birdie Sons, MD    Take 1 tablet by mouth every 6 (six) hours as needed for moderate     He also needs his clonazePAM (KLONOPIN) 2 MG tablet  That goes to [facility] in [place] on [address].

## 2016-09-11 NOTE — Telephone Encounter (Signed)
Please call in clonazepam  

## 2016-09-11 NOTE — Telephone Encounter (Signed)
LOV and last refill of Klonopin (1 refill) was 06/09/2016. Last refill Norco was 08/12/2016. Renaldo Fiddler, CMA

## 2016-09-16 ENCOUNTER — Telehealth: Payer: Self-pay | Admitting: Internal Medicine

## 2016-09-16 ENCOUNTER — Ambulatory Visit (INDEPENDENT_AMBULATORY_CARE_PROVIDER_SITE_OTHER): Payer: Self-pay | Admitting: Physician Assistant

## 2016-09-16 ENCOUNTER — Other Ambulatory Visit: Payer: Self-pay | Admitting: Family Medicine

## 2016-09-16 VITALS — BP 140/90 | HR 66 | Ht 70.0 in | Wt 190.8 lb

## 2016-09-16 DIAGNOSIS — I10 Essential (primary) hypertension: Secondary | ICD-10-CM

## 2016-09-16 DIAGNOSIS — I2510 Atherosclerotic heart disease of native coronary artery without angina pectoris: Secondary | ICD-10-CM

## 2016-09-16 DIAGNOSIS — I472 Ventricular tachycardia, unspecified: Secondary | ICD-10-CM

## 2016-09-16 DIAGNOSIS — I5022 Chronic systolic (congestive) heart failure: Secondary | ICD-10-CM

## 2016-09-16 DIAGNOSIS — Z95810 Presence of automatic (implantable) cardiac defibrillator: Secondary | ICD-10-CM

## 2016-09-16 MED ORDER — CLONAZEPAM 2 MG PO TABS: 2.0000 mg | ORAL_TABLET | Freq: Every day | ORAL | 3 refills | Status: DC
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
## 2016-09-16 NOTE — Telephone Encounter (Signed)
Patient dropped off Social Security Ability to Work Activities paper that needs completed-Heather back In office Friday will give to her then.

## 2016-09-16 NOTE — Telephone Encounter (Signed)
Please call in clonazepam  

## 2016-09-16 NOTE — Telephone Encounter (Signed)
Please review. Pharmacy was updated. Thanks!

## 2016-09-16 NOTE — Telephone Encounter (Signed)
Rx called in to pharmacy. 

## 2016-09-16 NOTE — Telephone Encounter (Signed)
Pt needs refill on his   clonazePAM (KLONOPIN) 2 MG tablet  [place]  Pt's call back is [phone]  Thanks [name]

## 2016-09-16 NOTE — Patient Instructions (Addendum)
Medication Instructions:  Your physician recommends that you continue on your current medications as directed. Please refer to the Current Medication list given to you today.   Labwork: -None  Testing/Procedures: -None  Follow-Up: Your physician recommends that you keep your scheduled  follow-up appointment with Dr. [name].   Any Other Special Instructions Will Be Listed Below (If Applicable). Office will call you when paperwork is signed and ready to be picked up.    If you need a refill on your cardiac medications before your next appointment, please call your pharmacy.

## 2016-09-17 ENCOUNTER — Telehealth: Payer: Self-pay

## 2016-09-17 ENCOUNTER — Encounter (HOSPITAL_COMMUNITY): Payer: Self-pay

## 2016-09-17 ENCOUNTER — Emergency Department (HOSPITAL_COMMUNITY)
Admission: EM | Admit: 2016-09-17 | Discharge: 2016-09-17 | Disposition: A | Discharge status: Home / Self Care | Payer: Medicaid Other | Attending: Emergency Medicine | Admitting: Emergency Medicine

## 2016-09-17 DIAGNOSIS — I5043 Acute on chronic combined systolic (congestive) and diastolic (congestive) heart failure: Secondary | ICD-10-CM | POA: Insufficient documentation

## 2016-09-17 DIAGNOSIS — I130 Hypertensive heart and chronic kidney disease with heart failure and stage 1 through stage 4 chronic kidney disease, or unspecified chronic kidney disease: Secondary | ICD-10-CM | POA: Insufficient documentation

## 2016-09-17 DIAGNOSIS — I472 Ventricular tachycardia, unspecified: Secondary | ICD-10-CM

## 2016-09-17 DIAGNOSIS — I2510 Atherosclerotic heart disease of native coronary artery without angina pectoris: Secondary | ICD-10-CM | POA: Insufficient documentation

## 2016-09-17 DIAGNOSIS — Z79899 Other long term (current) drug therapy: Secondary | ICD-10-CM | POA: Diagnosis not present

## 2016-09-17 DIAGNOSIS — Z7982 Long term (current) use of aspirin: Secondary | ICD-10-CM | POA: Diagnosis not present

## 2016-09-17 DIAGNOSIS — N3000 Acute cystitis without hematuria: Secondary | ICD-10-CM | POA: Insufficient documentation

## 2016-09-17 DIAGNOSIS — Z01812 Encounter for preprocedural laboratory examination: Secondary | ICD-10-CM

## 2016-09-17 DIAGNOSIS — N183 Chronic kidney disease, stage 3 (moderate): Secondary | ICD-10-CM | POA: Diagnosis not present

## 2016-09-17 DIAGNOSIS — R300 Dysuria: Secondary | ICD-10-CM | POA: Diagnosis present

## 2016-09-17 LAB — URINALYSIS, ROUTINE W REFLEX MICROSCOPIC
Bilirubin Urine: NEGATIVE
GLUCOSE, UA: NEGATIVE mg/dL
Ketones, ur: NEGATIVE mg/dL
NITRITE: NEGATIVE
PROTEIN: 100 mg/dL — AB
Specific Gravity, Urine: 1.02 (ref 1.005–1.030)
pH: 6 (ref 5.0–8.0)

## 2016-09-17 LAB — URINALYSIS, MICROSCOPIC (REFLEX)
BACTERIA UA: NONE SEEN
Squamous Epithelial / LPF: NONE SEEN

## 2016-09-17 MED ORDER — CEPHALEXIN 500 MG PO CAPS: 500.0000 mg | ORAL_CAPSULE | Freq: Four times a day (QID) | ORAL | 0 refills | Status: DC

## 2016-09-17 MED FILL — CEPHALEXIN 500 MG CAPSULE: 500 | 10 days supply | Qty: 40 | Fill #0

## 2016-09-17 NOTE — ED Triage Notes (Signed)
Pt has hx or recurrent UTI. Woke up at 0400 with burning with urination. No other symptoms

## 2016-09-17 NOTE — Telephone Encounter (Signed)
Called, pt unavailable. Left voice message to call back.  

## 2016-09-17 NOTE — Discharge Instructions (Signed)
° °  Get plenty of rest, and drink a lot of fluids.  See your PCP in 2 weeks. If you still have signs of infection, we would recommend that you see a urologist.

## 2016-09-17 NOTE — ED Notes (Signed)
Pt requesting update. Explained to pt we are waiting for provider to review labs and dispo. Pt verbalized understanding
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
## 2016-09-19 LAB — URINE CULTURE

## 2016-09-20 ENCOUNTER — Telehealth: Payer: Self-pay

## 2016-09-20 NOTE — Progress Notes (Signed)
ED Antimicrobial Stewardship Positive Culture Follow Up   Richard Cook is an 61 y.o. male who presented to [facility] on 09/17/2016 with a chief complaint of  Chief Complaint  Patient presents with  . Dysuria    Recent Results (from the past 720 hour(s))  Urine culture     Status: Abnormal   Collection Time: 08/22/16  2:09 AM  Result Value Ref Range Status   Specimen Description URINE, RANDOM  Final   Special Requests NONE  Final   Culture >=100,000 COLONIES/mL ENTEROBACTER AEROGENES (A)  Final   Report Status 08/24/2016 FINAL  Final   Organism ID, Bacteria ENTEROBACTER AEROGENES (A)  Final      Susceptibility   Enterobacter aerogenes - MIC*    CEFAZOLIN 8 RESISTANT Resistant     CEFTRIAXONE <=1 SENSITIVE Sensitive     CIPROFLOXACIN <=0.25 SENSITIVE Sensitive     GENTAMICIN <=1 SENSITIVE Sensitive     IMIPENEM 0.5 SENSITIVE Sensitive     NITROFURANTOIN 64 INTERMEDIATE Intermediate     TRIMETH/SULFA <=20 SENSITIVE Sensitive     PIP/TAZO 16 SENSITIVE Sensitive     * >=100,000 COLONIES/mL ENTEROBACTER AEROGENES  Urine culture     Status: Abnormal   Collection Time: 09/05/16 11:35 AM  Result Value Ref Range Status   Specimen Description URINE, CLEAN CATCH  Final   Special Requests NONE  Final   Culture <10,000 COLONIES/mL INSIGNIFICANT GROWTH (A)  Final   Report Status 09/06/2016 FINAL  Final  MRSA PCR Screening     Status: None   Collection Time: 09/06/16  3:18 AM  Result Value Ref Range Status   MRSA by PCR NEGATIVE NEGATIVE Final    Comment:        The GeneXpert MRSA Assay (FDA approved for NASAL specimens only), is one component of a comprehensive MRSA colonization surveillance program. It is not intended to diagnose MRSA infection nor to guide or monitor treatment for MRSA infections.   Urine culture     Status: Abnormal   Collection Time: 09/17/16  7:20 AM  Result Value Ref Range Status   Specimen Description URINE, CLEAN CATCH  Final   Special Requests NONE   Final   Culture 50,000 COLONIES/mL ENTEROBACTER AEROGENES (A)  Final   Report Status 09/19/2016 FINAL  Final   Organism ID, Bacteria ENTEROBACTER AEROGENES (A)  Final      Susceptibility   Enterobacter aerogenes - MIC*    CEFAZOLIN >=64 RESISTANT Resistant     CEFTRIAXONE <=1 SENSITIVE Sensitive     CIPROFLOXACIN <=0.25 SENSITIVE Sensitive     GENTAMICIN <=1 SENSITIVE Sensitive     IMIPENEM 1 SENSITIVE Sensitive     NITROFURANTOIN 128 RESISTANT Resistant     TRIMETH/SULFA <=20 SENSITIVE Sensitive     PIP/TAZO 16 SENSITIVE Sensitive     * 50,000 COLONIES/mL ENTEROBACTER AEROGENES    [x]  Treated with cephalexin, organism resistant to prescribed antimicrobial  New antibiotic prescription: Stop cephalexin. Start Bactrim DS, Take 1 tablet by mouth BID x 7 days  ED Provider: Carmon Sails, PA-C  Dimitri Ped, PharmD, BCPS PGY-2 Infectious Diseases Pharmacy Resident Pager: [phone] 09/20/2016, 9:56 AM

## 2016-09-20 NOTE — Telephone Encounter (Signed)
Post ED Visit - Positive Culture Follow-up: Successful Patient Follow-Up  Culture assessed and recommendations reviewed by: []  Elenor Quinones, Pharm.D. []  Heide Guile, Pharm.D., BCPS []  Parks Neptune, Pharm.D. []  Alycia Rossetti, Pharm.D., BCPS []  [place], Pharm.D., BCPS, AAHIVP []  Legrand Como, Pharm.D., BCPS, AAHIVP []  Milus Glazier, Pharm.D. []  Stephens November, [place].D. Dimitri Ped Pharm D Positive urine culture  []  Patient discharged without antimicrobial prescription and treatment is now indicated [x]  Organism is resistant to prescribed ED discharge antimicrobial []  Patient with positive blood cultures  Changes discussed with ED provider: Carmon Sails, [facility] New antibiotic prescription Bactrim DS 1 PO Bid x 7 days Called to [facility] [phone]  Contacted patient, date 09/20/16, time 1134   Sandon Yoho, [place] Comment 09/20/2016, 11:33 AM

## 2016-09-22 NOTE — Telephone Encounter (Signed)
Called, spoke with pt. Scheduled VT ablation for 10/01/16, pt arriving at 8:00 AM. Reviewed pt instruction document. Pt has scheduled appt on 09/30/16 for lab work at the [facility]. Cancelled appt with Dr. [name]. Pt will need f/u appt w/ Dr. Donley Redder weeks after ablation. Dr. Lovena Le will recommend f/u with Dr. [name] at that appt. Will mail copy of pt pre-procedure instructions today. Pt verbalized understanding.

## 2016-09-23 MED FILL — ISOSORBIDE MN ER 30 MG TAB: 30 | 30 days supply | Qty: 30 | Fill #1

## 2016-09-23 MED FILL — AMIODARONE HCL 200 MG TAB: 200 | 30 days supply | Qty: 60 | Fill #1

## 2016-09-29 ENCOUNTER — Telehealth: Payer: Self-pay | Admitting: Internal Medicine

## 2016-09-29 NOTE — Telephone Encounter (Signed)
Patient calling to verify when he is suppose to have his ablation. The patient states that he received a letter stating that it was scheduled for for 10/01/16, but received a phone call that it has been changed. The patient was notified that his ablation is scheduled for 10/07/16 for 10:30 am and he should arrive at 8:00 am. Patient was also reminded to come in for pre-procedure labs tomorrow. Patient verbalized understanding and is in agreement with this plan.

## 2016-09-29 NOTE — Telephone Encounter (Signed)
New message    Pt is calling about his procedure that is scheduled. He is asking that RN call him about it.

## 2016-09-30 ENCOUNTER — Other Ambulatory Visit: Payer: Self-pay | Admitting: *Deleted

## 2016-09-30 DIAGNOSIS — I2510 Atherosclerotic heart disease of native coronary artery without angina pectoris: Secondary | ICD-10-CM

## 2016-09-30 DIAGNOSIS — I472 Ventricular tachycardia, unspecified: Secondary | ICD-10-CM

## 2016-09-30 DIAGNOSIS — I2119 ST elevation (STEMI) myocardial infarction involving other coronary artery of inferior wall: Secondary | ICD-10-CM

## 2016-09-30 DIAGNOSIS — I255 Ischemic cardiomyopathy: Secondary | ICD-10-CM

## 2016-09-30 DIAGNOSIS — G903 Multi-system degeneration of the autonomic nervous system: Secondary | ICD-10-CM

## 2016-09-30 DIAGNOSIS — I5042 Chronic combined systolic (congestive) and diastolic (congestive) heart failure: Secondary | ICD-10-CM

## 2016-09-30 LAB — BASIC METABOLIC PANEL
BUN / CREAT RATIO: 9 — AB (ref 10–24)
BUN: 36 mg/dL — ABNORMAL HIGH (ref 8–27)
CO2: 23 mmol/L (ref 18–29)
Calcium: 9.9 mg/dL (ref 8.6–10.2)
Chloride: 97 mmol/L (ref 96–106)
Creatinine, Ser: 3.92 mg/dL — ABNORMAL HIGH (ref 0.76–1.27)
GFR, EST AFRICAN AMERICAN: 18 mL/min/{1.73_m2} — AB (ref >59)
GFR, EST NON AFRICAN AMERICAN: 16 mL/min/{1.73_m2} — AB (ref >59)
Glucose: 116 mg/dL — ABNORMAL HIGH (ref 65–99)
POTASSIUM: 5 mmol/L (ref 3.5–5.2)
Sodium: 141 mmol/L (ref 134–144)

## 2016-09-30 LAB — CBC WITH DIFFERENTIAL/PLATELET
BASOS: 0 %
Basophils Absolute: 0 10*3/uL (ref 0.0–0.2)
EOS (ABSOLUTE): 0.2 10*3/uL (ref 0.0–0.4)
Eos: 4 %
Hematocrit: 48.6 % (ref 37.5–51.0)
Hemoglobin: 16.5 g/dL (ref 13.0–17.7)
Immature Grans (Abs): 0 10*3/uL (ref 0.0–0.1)
Immature Granulocytes: 0 %
Lymphocytes Absolute: 1.4 10*3/uL (ref 0.7–3.1)
Lymphs: 25 %
MCH: 32.7 pg (ref 26.6–33.0)
MCHC: 34 g/dL (ref 31.5–35.7)
MCV: 96 fL (ref 79–97)
Monocytes Absolute: 0.3 10*3/uL (ref 0.1–0.9)
Monocytes: 6 %
NEUTROS ABS: 3.6 10*3/uL (ref 1.4–7.0)
Neutrophils: 65 %
PLATELETS: 173 10*3/uL (ref 150–379)
RBC: 5.04 x10E6/uL (ref 4.14–5.80)
RDW: 14.1 % (ref 12.3–15.4)
WBC: 5.6 10*3/uL (ref 3.4–10.8)

## 2016-09-30 NOTE — Addendum Note (Signed)
Addended by: Eulis Foster on: 09/30/2016 10:28 AM   Modules accepted: Orders

## 2016-10-02 NOTE — Telephone Encounter (Signed)
Called, spoke with pt. Pt wanted to make sure Dr. [name] was aware of scheduled ablation with Dr. Lovena Le.  Informed Dr. Lovena Le spoke with Dr. [name] about upcoming ablation. Pt wants to f/u with  Dr. [name] for EP. Informed Dr. Lovena Le will see pt 4 weeks after ablation, then be referred back to Dr. [name]. Pt verbalized understanding.

## 2016-10-02 NOTE — Telephone Encounter (Signed)
Follow up   Pt verbalized that he wants to speak to rn about his ablation

## 2016-10-07 ENCOUNTER — Ambulatory Visit (HOSPITAL_COMMUNITY)
Admission: RE | Admit: 2016-10-07 | Discharge: 2016-10-08 | Disposition: A | Discharge status: Home / Self Care | Payer: Medicaid Other | Source: Ambulatory Visit | Attending: Internal Medicine | Admitting: Internal Medicine

## 2016-10-07 ENCOUNTER — Encounter (HOSPITAL_COMMUNITY)
Admission: RE | Disposition: A | Discharge status: Home / Self Care | Payer: Self-pay | Source: Ambulatory Visit | Attending: Internal Medicine

## 2016-10-07 ENCOUNTER — Encounter (HOSPITAL_COMMUNITY): Payer: Self-pay | Admitting: General Practice

## 2016-10-07 ENCOUNTER — Encounter: Payer: Self-pay | Admitting: Internal Medicine

## 2016-10-07 DIAGNOSIS — I252 Old myocardial infarction: Secondary | ICD-10-CM | POA: Diagnosis not present

## 2016-10-07 DIAGNOSIS — M549 Dorsalgia, unspecified: Secondary | ICD-10-CM | POA: Insufficient documentation

## 2016-10-07 DIAGNOSIS — Z955 Presence of coronary angioplasty implant and graft: Secondary | ICD-10-CM | POA: Diagnosis not present

## 2016-10-07 DIAGNOSIS — G4730 Sleep apnea, unspecified: Secondary | ICD-10-CM | POA: Diagnosis not present

## 2016-10-07 DIAGNOSIS — I2510 Atherosclerotic heart disease of native coronary artery without angina pectoris: Secondary | ICD-10-CM | POA: Diagnosis not present

## 2016-10-07 DIAGNOSIS — N183 Chronic kidney disease, stage 3 (moderate): Secondary | ICD-10-CM | POA: Diagnosis not present

## 2016-10-07 DIAGNOSIS — Z8249 Family history of ischemic heart disease and other diseases of the circulatory system: Secondary | ICD-10-CM | POA: Diagnosis not present

## 2016-10-07 DIAGNOSIS — M069 Rheumatoid arthritis, unspecified: Secondary | ICD-10-CM | POA: Insufficient documentation

## 2016-10-07 DIAGNOSIS — F419 Anxiety disorder, unspecified: Secondary | ICD-10-CM | POA: Diagnosis not present

## 2016-10-07 DIAGNOSIS — E785 Hyperlipidemia, unspecified: Secondary | ICD-10-CM | POA: Diagnosis not present

## 2016-10-07 DIAGNOSIS — M109 Gout, unspecified: Secondary | ICD-10-CM | POA: Diagnosis not present

## 2016-10-07 DIAGNOSIS — I472 Ventricular tachycardia, unspecified: Secondary | ICD-10-CM

## 2016-10-07 DIAGNOSIS — I130 Hypertensive heart and chronic kidney disease with heart failure and stage 1 through stage 4 chronic kidney disease, or unspecified chronic kidney disease: Secondary | ICD-10-CM | POA: Insufficient documentation

## 2016-10-07 DIAGNOSIS — Z7982 Long term (current) use of aspirin: Secondary | ICD-10-CM | POA: Insufficient documentation

## 2016-10-07 DIAGNOSIS — I255 Ischemic cardiomyopathy: Secondary | ICD-10-CM | POA: Diagnosis not present

## 2016-10-07 DIAGNOSIS — I5042 Chronic combined systolic (congestive) and diastolic (congestive) heart failure: Secondary | ICD-10-CM | POA: Insufficient documentation

## 2016-10-07 DIAGNOSIS — Z951 Presence of aortocoronary bypass graft: Secondary | ICD-10-CM | POA: Diagnosis not present

## 2016-10-07 DIAGNOSIS — G8929 Other chronic pain: Secondary | ICD-10-CM | POA: Diagnosis not present

## 2016-10-07 HISTORY — DX: Other intervertebral disc degeneration, lumbar region: M51.36

## 2016-10-07 HISTORY — PX: V TACH ABLATION: EP1227

## 2016-10-07 HISTORY — DX: Other intervertebral disc displacement, lumbar region: M51.26

## 2016-10-07 HISTORY — DX: Presence of automatic (implantable) cardiac defibrillator: Z95.810

## 2016-10-07 HISTORY — DX: Other intervertebral disc degeneration, lumbar region without mention of lumbar back pain or lower extremity pain: M51.369

## 2016-10-07 HISTORY — PX: VENTRICULAR ABLATION SURGERY: SHX835

## 2016-10-07 LAB — POCT ACTIVATED CLOTTING TIME
ACTIVATED CLOTTING TIME: 202 s
ACTIVATED CLOTTING TIME: 230 s
ACTIVATED CLOTTING TIME: 235 s
Activated Clotting Time: 219 seconds
Activated Clotting Time: 235 seconds
Activated Clotting Time: 197 seconds
Activated Clotting Time: 175 seconds

## 2016-10-07 SURGERY — V TACH ABLATION

## 2016-10-07 MED ORDER — HYDROCODONE-ACETAMINOPHEN 5-325 MG PO TABS
ORAL_TABLET | ORAL | Status: AC
  Filled 2016-10-07: qty 1

## 2016-10-07 MED ORDER — FENTANYL CITRATE (PF) 100 MCG/2ML IJ SOLN
INTRAMUSCULAR | Status: AC
  Filled 2016-10-07: qty 2

## 2016-10-07 MED ORDER — FUROSEMIDE 40 MG PO TABS: 40.0000 mg | ORAL_TABLET | ORAL | Status: DC

## 2016-10-07 MED ORDER — BUPIVACAINE HCL (PF) 0.25 % IJ SOLN
INTRAMUSCULAR | Status: DC | PRN
  Administered 2016-10-07: 45 mL

## 2016-10-07 MED ORDER — MIDAZOLAM HCL 5 MG/5ML IJ SOLN
INTRAMUSCULAR | Status: AC
  Filled 2016-10-07: qty 5

## 2016-10-07 MED ORDER — HEPARIN (PORCINE) IN NACL 2-0.9 UNIT/ML-% IJ SOLN
INTRAMUSCULAR | Status: AC
  Filled 2016-10-07: qty 500

## 2016-10-07 MED ORDER — HYDROCODONE-ACETAMINOPHEN 10-325 MG PO TABS
1.0000 | ORAL_TABLET | Freq: Four times a day (QID) | ORAL | Status: DC | PRN
  Administered 2016-10-07 – 2016-10-08 (×3): 1 via ORAL
  Filled 2016-10-07 (×4): qty 1

## 2016-10-07 MED ORDER — ACETAMINOPHEN 325 MG PO TABS: 650.0000 mg | ORAL_TABLET | ORAL | Status: DC | PRN

## 2016-10-07 MED ORDER — ONDANSETRON HCL 4 MG/2ML IJ SOLN: 4.0000 mg | Freq: Four times a day (QID) | INTRAMUSCULAR | Status: DC | PRN

## 2016-10-07 MED ORDER — HEPARIN (PORCINE) IN NACL 2-0.9 UNIT/ML-% IJ SOLN
INTRAMUSCULAR | Status: DC | PRN
  Administered 2016-10-07: 11:00:00

## 2016-10-07 MED ORDER — ASPIRIN EC 81 MG PO TBEC
81.0000 mg | DELAYED_RELEASE_TABLET | Freq: Every day | ORAL | Status: DC
  Administered 2016-10-07 – 2016-10-08 (×2): 81 mg via ORAL
  Filled 2016-10-07 (×2): qty 1

## 2016-10-07 MED ORDER — FENTANYL CITRATE (PF) 100 MCG/2ML IJ SOLN
INTRAMUSCULAR | Status: DC | PRN
  Administered 2016-10-07 (×2): 25 ug via INTRAVENOUS
  Administered 2016-10-07 (×3): 12.5 ug via INTRAVENOUS
  Administered 2016-10-07: 25 ug via INTRAVENOUS
  Administered 2016-10-07: 12.5 ug via INTRAVENOUS
  Administered 2016-10-07 (×2): 25 ug via INTRAVENOUS
  Administered 2016-10-07: 12.5 ug via INTRAVENOUS

## 2016-10-07 MED ORDER — HEPARIN SODIUM (PORCINE) 1000 UNIT/ML IJ SOLN
INTRAMUSCULAR | Status: AC
  Filled 2016-10-07: qty 1

## 2016-10-07 MED ORDER — AMIODARONE HCL 200 MG PO TABS
200.0000 mg | ORAL_TABLET | Freq: Two times a day (BID) | ORAL | Status: DC
  Administered 2016-10-07 – 2016-10-08 (×2): 200 mg via ORAL
  Filled 2016-10-07 (×2): qty 1

## 2016-10-07 MED ORDER — HEPARIN (PORCINE) IN NACL 2-0.9 UNIT/ML-% IJ SOLN
INTRAMUSCULAR | Status: AC
Start: 2016-10-07 — End: 2016-10-07
  Filled 2016-10-07: qty 500

## 2016-10-07 MED ORDER — SODIUM CHLORIDE 0.9 % IV SOLN
INTRAVENOUS | Status: DC
  Administered 2016-10-07: 09:00:00 via INTRAVENOUS

## 2016-10-07 MED ORDER — HYDRALAZINE HCL 20 MG/ML IJ SOLN
10.0000 mg | Freq: Once | INTRAMUSCULAR | Status: AC
  Administered 2016-10-07: 10 mg via INTRAVENOUS

## 2016-10-07 MED ORDER — OFF THE BEAT BOOK
Freq: Once | Status: AC
  Administered 2016-10-07: 20:00:00
  Filled 2016-10-07: qty 1

## 2016-10-07 MED ORDER — BUPIVACAINE HCL (PF) 0.25 % IJ SOLN
INTRAMUSCULAR | Status: AC
  Filled 2016-10-07: qty 60

## 2016-10-07 MED ORDER — ISOSORBIDE MONONITRATE ER 30 MG PO TB24
30.0000 mg | ORAL_TABLET | Freq: Every day | ORAL | Status: DC
  Administered 2016-10-07 – 2016-10-08 (×2): 30 mg via ORAL
  Filled 2016-10-07 (×2): qty 1

## 2016-10-07 MED ORDER — SODIUM CHLORIDE 0.9 % IV SOLN: 250.0000 mL | INTRAVENOUS | Status: DC | PRN

## 2016-10-07 MED ORDER — ATORVASTATIN CALCIUM 20 MG PO TABS
20.0000 mg | ORAL_TABLET | Freq: Every day | ORAL | Status: DC
  Administered 2016-10-07 – 2016-10-08 (×2): 20 mg via ORAL
  Filled 2016-10-07 (×2): qty 1

## 2016-10-07 MED ORDER — HEPARIN SODIUM (PORCINE) 1000 UNIT/ML IJ SOLN
INTRAMUSCULAR | Status: DC | PRN
  Administered 2016-10-07 (×2): 1000 [IU] via INTRAVENOUS
  Administered 2016-10-07: 6000 [IU] via INTRAVENOUS
  Administered 2016-10-07 (×3): 1000 [IU] via INTRAVENOUS
  Administered 2016-10-07 (×2): 2000 [IU] via INTRAVENOUS

## 2016-10-07 MED ORDER — HYDRALAZINE HCL 20 MG/ML IJ SOLN
INTRAMUSCULAR | Status: AC
  Filled 2016-10-07: qty 1

## 2016-10-07 MED ORDER — CLONAZEPAM 0.5 MG PO TABS
2.0000 mg | ORAL_TABLET | Freq: Every day | ORAL | Status: DC
  Administered 2016-10-07: 2 mg via ORAL
  Filled 2016-10-07: qty 4

## 2016-10-07 MED ORDER — SODIUM CHLORIDE 0.9% FLUSH: 3.0000 mL | Freq: Two times a day (BID) | INTRAVENOUS | Status: DC

## 2016-10-07 MED ORDER — OMEGA-3-ACID ETHYL ESTERS 1 G PO CAPS
1.0000 g | ORAL_CAPSULE | Freq: Every day | ORAL | Status: DC
  Administered 2016-10-07 – 2016-10-08 (×2): 1 g via ORAL
  Filled 2016-10-07 (×2): qty 1

## 2016-10-07 MED ORDER — MIDAZOLAM HCL 5 MG/5ML IJ SOLN
INTRAMUSCULAR | Status: DC | PRN
  Administered 2016-10-07: 2 mg via INTRAVENOUS
  Administered 2016-10-07 (×3): 1 mg via INTRAVENOUS
  Administered 2016-10-07 (×2): 2 mg via INTRAVENOUS
  Administered 2016-10-07 (×6): 1 mg via INTRAVENOUS

## 2016-10-07 MED ORDER — SODIUM CHLORIDE 0.9% FLUSH: 3.0000 mL | INTRAVENOUS | Status: DC | PRN

## 2016-10-07 SURGICAL SUPPLY — 13 items
BAG SNAP BAND KOVER 36X36 (MISCELLANEOUS) ×2 IMPLANT
CATH JOSEPHSON QUAD-ALLRED 6FR (CATHETERS) ×2 IMPLANT
CATH POLARIS X 2.5/5/2.5 DECAP (CATHETERS) ×2 IMPLANT
CATH SMTCH THERMOCOOL SF DF (CATHETERS) ×2 IMPLANT
PACK EP LATEX FREE (CUSTOM PROCEDURE TRAY) ×1
PACK EP LF (CUSTOM PROCEDURE TRAY) ×1 IMPLANT
PAD DEFIB LIFELINK (PAD) ×2 IMPLANT
PATCH CARTO3 (PAD) ×2 IMPLANT
SHEATH PINNACLE 6F 10CM (SHEATH) ×2 IMPLANT
SHEATH PINNACLE 7F 10CM (SHEATH) ×2 IMPLANT
SHEATH PINNACLE 8F 10CM (SHEATH) ×2 IMPLANT
SHIELD RADPAD SCOOP 12X17 (MISCELLANEOUS) ×2 IMPLANT
TUBING SMART ABLATE COOLFLOW (TUBING) ×2 IMPLANT
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
## 2016-10-07 NOTE — Care Management Note (Addendum)
Case Management Note  Patient Details  Name: Marcio Hoque MRN: [id number] Date of Birth: 1955-12-28  Subjective/Objective:    S/p ablation, patient has no insurance on file, he has went to [facility] in the past, he may need med ast  At dc.  NCM will cont to follow for dc needs for PCP and med ast.               Action/Plan:   Expected Discharge Date:                  Expected Discharge Plan:  Home/Self Care  In-House Referral:     Discharge planning Services  CM Consult  Post Acute Care Choice:    Choice offered to:     DME Arranged:    DME Agency:     HH Arranged:    [place] Agency:     Status of Service:  In process, will continue to follow  If discussed at Long Length of Stay Meetings, dates discussed:    Additional Comments:  Zenon Mayo, RN 10/07/2016, 9:10 PM

## 2016-10-07 NOTE — Progress Notes (Signed)
Site area: Rt fem art 65fr x1 rt fem venous sheaths 70frx1 7frx1 removed Site Prior to Removal:  Level 0 Pressure Applied For: 71min Manual:   yes Patient Status During Pull:  A/O Post Pull Site:  Level 1 Post Pull Instructions Given:  Pt understands instructions  Post Pull Pulses Present: Rt Dp/Pt Dressing Applied:  tegaderm and a 4x4//pressure dressing applied Bedrest begins @ 18:40:00 Comments: Pt rt groin is bruised, but soft. No hematoma noted at this time. RCox -RCIS held pressure for 40 min. Shaman Muscarella-RCIS applied additional  pressure for 74min. Pt leaves cath lab holding area in stable condition. Rt groin is stable and dressing is CDI.

## 2016-10-08 ENCOUNTER — Other Ambulatory Visit: Payer: Self-pay | Admitting: Family Medicine

## 2016-10-08 ENCOUNTER — Encounter (HOSPITAL_COMMUNITY): Payer: Self-pay | Admitting: Internal Medicine

## 2016-10-08 DIAGNOSIS — I5042 Chronic combined systolic (congestive) and diastolic (congestive) heart failure: Secondary | ICD-10-CM | POA: Diagnosis not present

## 2016-10-08 DIAGNOSIS — I472 Ventricular tachycardia: Secondary | ICD-10-CM

## 2016-10-08 DIAGNOSIS — I2510 Atherosclerotic heart disease of native coronary artery without angina pectoris: Secondary | ICD-10-CM | POA: Diagnosis not present

## 2016-10-08 DIAGNOSIS — I130 Hypertensive heart and chronic kidney disease with heart failure and stage 1 through stage 4 chronic kidney disease, or unspecified chronic kidney disease: Secondary | ICD-10-CM | POA: Diagnosis not present

## 2016-10-08 MED ORDER — AMIODARONE HCL 200 MG PO TABS: 200.0000 mg | ORAL_TABLET | Freq: Two times a day (BID) | ORAL | Status: DC

## 2016-10-08 MED FILL — Midazolam HCl Inj 5 MG/5ML (Base Equivalent): INTRAMUSCULAR | Qty: 5 | Status: AC

## 2016-10-08 MED FILL — Fentanyl Citrate Preservative Free (PF) Inj 100 MCG/2ML: INTRAMUSCULAR | Qty: 2 | Status: AC

## 2016-10-08 NOTE — Discharge Instructions (Signed)
No driving for 3 days. No lifting over 5 lbs for 1 week. No vigorous or sexual activity for 1 week. You may return to work on 10/14/16. Keep procedure site clean & dry. If you notice increased pain, swelling, bleeding or pus, call/return!  You may shower, but no soaking baths/hot tubs/pools for 1 week.

## 2016-10-08 NOTE — Care Management Note (Addendum)
Case Management Note  Patient Details  Name: Richard Cook MRN: [id number] Date of Birth: [birth year]  Subjective/Objective:   Pt admitted with VT                  Action/Plan:  Pt states he was PTA from home alone completely independent - sister will come and stay with pt post discharge for a few days.  Pt states he has follow up with cardiologist and declined for CM to set up follow up appt with [facility] - pt states he is active with services and utilizes medication assistance with the pharmacy - pt stated he refilled needed medications just a couple of weeks ago.  Pt declined needing MATCH stated he can afford copays with the pharmacy with Amiodarone costing the most at $10 - home medications have not changed per AVS.  CM encouraged pt to contact [facility] soon.  No CM needs identified   Expected Discharge Date:  10/08/16               Expected Discharge Plan:  Home/Self Care  In-House Referral:     Discharge planning Services  CM Consult, [facility]  Post Acute Care Choice:    Choice offered to:     DME Arranged:    DME Agency:     HH Arranged:    HH Agency:     Status of Service:  Completed, signed off  If discussed at [employer], dates discussed:    Additional Comments: CM contacted [facility] for verification - pt has not established PCP relationship and per pharmacy should not be able to get medications filled under medication assistance program. Pharmacy stated they would fill prescriptions one more time for pt but he needs to establish PCP with agency.  CM provided pt MATCH and informed pt that this benefit expires in 8 days  - provided pt [place] and [employer] information and urged pt to call and set up appt post discharge so he can utilize medication assistance in the future. Maryclare Labrador, RN 10/08/2016, 10:17 AM

## 2016-10-08 NOTE — Telephone Encounter (Signed)
Please review. Patient was in the ER yesterday , but I cant tell if he has already had this filled. Per the dispense report, it was last dispensed in 10/20 with 3 refills. Thanks!

## 2016-10-08 NOTE — Telephone Encounter (Signed)
Pt contacted office for refill request on the following medications:  clonazePAM (KLONOPIN) 2 MG tablet.  [facility] [address] [place].  AR#[phone]/QN  Pt states he called on Monday to request a new Rx sent for 2 pill a day.  Pt states the pharmacy has not rec'd a new Rx/MW

## 2016-10-08 NOTE — Discharge Summary (Signed)
ELECTROPHYSIOLOGY PROCEDURE DISCHARGE SUMMARY    Patient ID: Richard Cook,  MRN: [id number], DOB/AGE: Nov 20, 1955 61 y.o.  Admit date: 10/07/2016 Discharge date: 10/08/2016  Primary Care Physician: Lelon Huh, MD  Primary Cardiologist/Electrophysiologist: Dr. [name]  Primary Discharge Diagnosis:  1. VT  Secondary Discharge Diagnosis:  1. ICM w/ICD 2. CAD 3. HTN 4. HLD 5. CRI (stage III)  Allergies  Allergen Reactions  . Eggs Or Egg-Derived Products Hives  . Codeine Other (See Comments)    Tolerates hydrocodone     Procedures This Admission: 1.  Electrophysiology study and radiofrequency catheter ablation on 10/07/16 by Dr Lovena Le.  This study demonstrated: Successful electrical a study and catheter ablation inducible sustained monomorphic ventricular tachycardia There were no inducible arrhythmias following ablation and no early apparent complications.   Brief HPI: Richard Cook is a 61 y.o. male with a past medical history as outlined above.  He has had recurrent MMVT.   Risks, benefits, and alternatives to ablation were reviewed with the patient who wished to proceed.    Hospital Course:  The patient was admitted and underwent EPS/RFCA of VT with details as outlined above. He was monitored on telemetry overnight which demonstrated SR.  Groin site is stable, moderate ecchymosis, no remaining hematoma, soft with minimal tenderness.  He was examined by Dr. Lovena Le and considered stable for discharge to home.  The patient c/o stiff/painful R knee, by exam, felt musculoskeletal, not related to groin or procedure, he was able to ambulate well and without difficulty, Dr. Lovena Le recommended to avoid NSAID type medicines given his CRI.  If persists to see his PMD for further evaluation.  Follow up will be arranged in 4 weeks.  Wound care and activity restrictions were reviewed with the patient at length prior to discharge. Will continue his home medicines, including his  amiodarone unchanged until follow up.    Physical Exam: Vitals:   10/07/16 1907 10/07/16 2000 10/08/16 0648 10/08/16 0800  BP: 122/81  102/71 116/85  Pulse: 72 74 60 77  Resp: 11 14 13  (!) 23  Temp: 98.2 F (36.8 C)  97.8 F (36.6 C) 97.7 F (36.5 C)  TempSrc: Oral  Oral Oral  SpO2: 97% 99%  98%  Weight: 175 lb 14.8 oz (79.8 kg)  175 lb 14.8 oz (79.8 kg)   Height: 5\' 10"  (1.778 m)       GEN- The patient is well appearing, alert and oriented x 3 today.   HEENT: normocephalic, atraumatic; sclera clear, conjunctiva pink; hearing intact; oropharynx clear; neck supple, no JVP Lymph- no cervical lymphadenopathy Lungs- CTA b/l, normal work of breathing.  No wheezes, rales, rhonchi Heart- RRR, no murmurs, rubs or gallops, PMI not laterally displaced GI- soft, non-tender, non-distended, bowel sounds present, no hepatosplenomegaly Extremities- no clubbing, cyanosis, or edema; popliteal, DP/PT/radial pulses 3+ bilaterally, groin without remaining hematoma, no bruit, moderate ecchymosis, groin is soft, minimal tenderness MS- no significant deformity or atrophy, slight swelling to right knee without erythema or increased heat to the surrounding tissues. Skin- warm and dry, no rash or lesion Psych- euthymic mood, full affect Neuro- strength and sensation are intact   Discharge Vitals: BP 116/85 (BP Location: Left Arm)   Pulse 77   Temp 97.7 F (36.5 C) (Oral)   Resp (!) 23   Ht 5\' 10"  (1.778 m)   Wt 175 lb 14.8 oz (79.8 kg)   SpO2 98%   BMI 25.24 kg/m     Labs:   Lab  Results  Component Value Date   WBC 5.6 09/30/2016   HGB 15.4 09/05/2016   HCT 48.6 09/30/2016   MCV 96 09/30/2016   PLT 173 09/30/2016   No results for input(s): NA, K, CL, CO2, BUN, CREATININE, CALCIUM, PROT, BILITOT, ALKPHOS, ALT, AST, GLUCOSE in the last 168 hours.  Invalid input(s): LABALBU  Discharge Medications:  Allergies as of 10/08/2016      Reactions   Eggs Or Egg-derived Products Hives   Codeine  Other (See Comments)   Tolerates hydrocodone      Medication List    STOP taking these medications   cephALEXin 500 MG capsule Commonly known as:  KEFLEX     TAKE these medications   amiodarone 200 MG tablet Commonly known as:  PACERONE Take 1 tablet (200 mg total) by mouth 2 (two) times daily.   aspirin EC 81 MG tablet Take 81 mg by mouth daily.   atorvastatin 20 MG tablet Commonly known as:  LIPITOR Take 1 tablet (20 mg total) by mouth daily.   clonazePAM 2 MG tablet Commonly known as:  KLONOPIN Take 1 tablet (2 mg total) by mouth at bedtime.   FISH OIL PO Take 1 capsule by mouth daily.   furosemide 40 MG tablet Commonly known as:  LASIX Take 1 tablet (40 mg total) by mouth as directed. Take 1 tab every Friday and every Tuesday What changed:  when to take this  additional instructions Notes to patient:  Continue to take this medicine as you have been, no changes are being made.   HYDROcodone-acetaminophen 10-325 MG tablet Commonly known as:  NORCO Take 1 tablet by mouth every 6 (six) hours as needed for moderate pain.   isosorbide mononitrate 30 MG 24 hr tablet Commonly known as:  IMDUR Take 30 mg by mouth daily.       Disposition:  Home Discharge Instructions    Diet - low sodium heart healthy    Complete by:  As directed    Increase activity slowly    Complete by:  As directed      Follow-up Information    [name], MD Follow up on 10/28/2016.   Specialty:  Cardiology Why:  10:30AM Contact information: [address] [address] [phone]           Duration of Discharge Encounter: Greater than 30 minutes including physician time.  Venetia Night, PA-C 10/08/2016 9:34 AM   EP Attending  Patient seen and examined. Agree with above. He is stable for DC and his groin is soft. I have encouraged the patient not to perform any strenuous activity.  Mikle Bosworth.D.

## 2016-10-09 MED ORDER — CLONAZEPAM 2 MG PO TABS: 2.0000 mg | ORAL_TABLET | Freq: Two times a day (BID) | ORAL | 3 refills | Status: DC

## 2016-10-09 NOTE — Telephone Encounter (Signed)
Pt states he did not receive this at the ER yesterday.  Pt states he is running low on his Rx due to he has been taking 2 pills a day.  Pt states he needs a new Rx sent today.  ZX#[phone]

## 2016-10-09 NOTE — Telephone Encounter (Signed)
Done. Prescription called into pharmacy. Patient advised. 

## 2016-10-09 NOTE — Telephone Encounter (Signed)
It looks like the pt's clonazepam was increased at the ER for 2 tablets a day from 1 tablet a day.  Pt says he is going to run out soon and the pharmacy will not let him refill the prescription early.  He is worried that he is going to have another panic attack ane will have to go back to the ER.  Please advised.   Thanks,   -Mickel Baas

## 2016-10-09 NOTE — Telephone Encounter (Signed)
Please call in clonazepam  

## 2016-10-12 ENCOUNTER — Telehealth: Payer: Self-pay | Admitting: Internal Medicine

## 2016-10-12 NOTE — Telephone Encounter (Signed)
LVM For patient [employer] Statement paperwork ready for pick up.

## 2016-10-12 NOTE — Telephone Encounter (Signed)
Per patient he has asked for [employer] paperwork to be mailed to his home address. Placed in Mail.

## 2016-10-13 ENCOUNTER — Telehealth: Payer: Self-pay

## 2016-10-13 DIAGNOSIS — F321 Major depressive disorder, single episode, moderate: Secondary | ICD-10-CM

## 2016-10-13 MED ORDER — PAROXETINE HCL 20 MG PO TABS: 20.0000 mg | ORAL_TABLET | Freq: Every day | ORAL | 0 refills | Status: DC

## 2016-10-13 NOTE — Telephone Encounter (Signed)
Pt advised and medication sent in. Renaldo Fiddler, CMA

## 2016-10-13 NOTE — Telephone Encounter (Signed)
I would recommend he start mild antidepressant which would also help with anxiety. Recommend he start paroxetine 20mg , 1/2 tablet for four days, then increase to 1 tablet daily, #30, no rf. Follow up o.v. On Monday as scheduled.

## 2016-10-13 NOTE — Telephone Encounter (Signed)
Pt called concerned because his anxiety and depression is worsening after decreasing dose of Klonopin. Pt was taking 3 tablets of 2 mg Klonopin qhs, and was advised to decrease to 1 tablet prn, as well as add ambien for sleep. Ambien caused GI upset, and had to be discontinued. Pt is now taking Klonopin 2 mg 2 tablets qhs, and is still experiencing anxiety. Is depressed now that he has been taken out of work due to cardiac issues. Pt also admits he has had suicidal thoughts "a couple times". When asked if he has a plan, he states he would "go to the [employer] and starve myself to death or feed a pipe into my car (for carbon monoxide poisoning)". Pt does reports these are fleeting thoughts, and he would not go through with them. Pt reports he reads his bible to help him though thoughts like this. I offered pt appointment today, but he did not want to come in due to pain from ablation on 10/07/2016. Pt does not want to come in until next week. Scheduled 30 minute appointment for Monday at 2:30, because pt requested afternoon appointment. Advised pt to go to ER if he has suicidal ideation again, and pt agrees. Renaldo Fiddler, CMA

## 2016-10-19 ENCOUNTER — Ambulatory Visit (INDEPENDENT_AMBULATORY_CARE_PROVIDER_SITE_OTHER): Payer: Medicaid Other | Admitting: Family Medicine

## 2016-10-19 ENCOUNTER — Other Ambulatory Visit: Payer: Self-pay | Admitting: Family Medicine

## 2016-10-19 ENCOUNTER — Encounter: Payer: Self-pay | Admitting: Family Medicine

## 2016-10-19 VITALS — BP 118/70 | HR 68 | Temp 99.1°F | Resp 16 | Wt 188.0 lb

## 2016-10-19 DIAGNOSIS — F321 Major depressive disorder, single episode, moderate: Secondary | ICD-10-CM

## 2016-10-19 DIAGNOSIS — M5489 Other dorsalgia: Secondary | ICD-10-CM

## 2016-10-19 MED ORDER — HYDROCODONE-ACETAMINOPHEN 10-325 MG PO TABS: 1.0000 | ORAL_TABLET | Freq: Four times a day (QID) | ORAL | 0 refills | Status: DC | PRN

## 2016-10-19 MED ORDER — CLONAZEPAM 2 MG PO TABS: 2.0000 mg | ORAL_TABLET | Freq: Three times a day (TID) | ORAL | 1 refills | Status: DC | PRN

## 2016-10-19 MED ORDER — PAROXETINE HCL 40 MG PO TABS: 40.0000 mg | ORAL_TABLET | Freq: Every day | ORAL | 2 refills | Status: DC

## 2016-10-19 NOTE — Progress Notes (Signed)
Patient: Richard Cook Male    DOB: [birth year]   61 y.o.   MRN: [id number] Visit Date: 10/19/2016  Today's Provider: Lelon Huh, MD   Chief Complaint  Patient presents with  . Depression   Subjective:    HPI  Depression, Follow-up  He  was last seen for this 3 months ago. Changes made at last visit include adding paroxetine 20mg  daily (on 10/13/2016). Patient also mentions that he takes at least 3 tablets of clonazepam daily.    He reports good compliance with treatment. He is not having side effects.   He reports fair tolerance of treatment. Current symptoms include: feelings or worthlessness, anxiety, insomnia, and suicidal thoughts, feelings of sadness.  He feels he is Worse since last visit.        Allergies  Allergen Reactions  . Eggs Or Egg-Derived Products Hives  . Codeine Other (See Comments)    Tolerates hydrocodone     Current Outpatient Prescriptions:  .  amiodarone (PACERONE) 200 MG tablet, Take 1 tablet (200 mg total) by mouth 2 (two) times daily., Disp: , Rfl:  .  aspirin EC 81 MG tablet, Take 81 mg by mouth daily., Disp: , Rfl:  .  atorvastatin (LIPITOR) 20 MG tablet, Take 1 tablet (20 mg total) by mouth daily., Disp: 30 tablet, Rfl: 6 .  clonazePAM (KLONOPIN) 2 MG tablet, Take 1 tablet (2 mg total) by mouth 2 (two) times daily., Disp: 60 tablet, Rfl: 3 .  furosemide (LASIX) 40 MG tablet, Take 1 tablet (40 mg total) by mouth as directed. Take 1 tab every Friday and every Tuesday (Patient taking differently: Take 40 mg by mouth 2 (two) times a week. ), Disp: , Rfl:  .  HYDROcodone-acetaminophen (NORCO) 10-325 MG tablet, Take 1 tablet by mouth every 6 (six) hours as needed for moderate pain., Disp: 60 tablet, Rfl: 0 .  isosorbide mononitrate (IMDUR) 30 MG 24 hr tablet, Take 30 mg by mouth daily., Disp: , Rfl:  .  Omega-3 Fatty Acids (FISH OIL PO), Take 1 capsule by mouth daily., Disp: , Rfl:  .  PARoxetine (PAXIL) 20 MG tablet, Take 1  tablet (20 mg total) by mouth daily. Take 1/2 tablet po x 4 days, then increase to 1 tablet po qd, Disp: 30 tablet, Rfl: 0  Review of Systems  Constitutional: Positive for activity change, appetite change and fatigue.  Respiratory: Negative.   Cardiovascular: Negative for chest pain, palpitations and leg swelling.  Gastrointestinal: Positive for nausea.  Neurological: Positive for headaches.  Psychiatric/Behavioral: Positive for decreased concentration, sleep disturbance and suicidal ideas. The patient is nervous/anxious.     Social History  Substance Use Topics  . Smoking status: Never Smoker  . Smokeless tobacco: Never Used  . Alcohol use No     Comment: 10/07/2016 "quit in the early 1990s"   Objective:   BP 118/70 (BP Location: Left Arm, Patient Position: Sitting, Cuff Size: Normal)   Pulse 68   Temp 99.1 F (37.3 C)   Resp 16   Wt 188 lb (85.3 kg)   BMI 26.98 kg/m  Vitals:   10/19/16 1431  BP: 118/70  Pulse: 68  Resp: 16  Temp: 99.1 F (37.3 C)  Weight: 188 lb (85.3 kg)     Physical Exam  General appearance: alert, well developed, well nourished, cooperative and in no distress Head: Normocephalic, without obvious abnormality, atraumatic Respiratory: Respirations even and unlabored, normal respiratory rate Extremities: No gross deformities Skin:  Skin color, texture, turgor normal. No rashes seen  Psych: Appropriate mood and affect. Neurologic: Mental status: Alert, oriented to person, place, and time, thought content appropriate.     Assessment & Plan:     1. Depression, major, single episode, moderate ([place]) Initially responded well to paroxetine with no adverse effects, but now does not seem to be working as well.  - PARoxetine (PAXIL) 40 MG tablet; Take 1 tablet (40 mg total) by mouth daily.  Dispense: 30 tablet; Refill: 2  2. Back pain without sciatica  - HYDROcodone-acetaminophen (NORCO) 10-325 MG tablet; Take 1 tablet by mouth every 6 (six) hours as  needed for moderate pain.  Dispense: 60 tablet; Refill: 0       Lelon Huh, MD  [facility]

## 2016-10-19 NOTE — Telephone Encounter (Signed)
Please call in clonazepam  He should try OTC metatonin 5mg  at bedtime to help sleep. It is not safe to combine any prescription sleeping medications with his current meds.

## 2016-10-19 NOTE — Telephone Encounter (Signed)
Pt contacted office for refill request on the following medications: clonazePAM (KLONOPIN) 2 MG tablet  Pt stated that he had an OV with Dr. Caryn Section today and he just noticed that he didn't get an Rx for clonazePAM (KLONOPIN) 2 MG tablet. Pt stated that he discussed with Dr. Caryn Section that the 2 tablets a day weren't helping and he had gone back to the 3 tablets a day dose so the Rx that was sent in earlier this month will be running out soon. Pt also stated that he discussed that he hasn't been sleeping well and wanted something to help but he didn't have an Rx for that either. Pt is requesting a new Rx for clonazePAM (KLONOPIN) 2 MG tablet and something to help him sleep be sent to [facility]. Please advise. Thanks TNP

## 2016-10-19 NOTE — Telephone Encounter (Signed)
Please review. Thanks!  

## 2016-10-20 NOTE — Telephone Encounter (Signed)
Left message to call back to verify his pharmacy before I called in the medication.

## 2016-10-21 NOTE — Telephone Encounter (Signed)
Left Message to to call back to verify his pharmacy.  Thanks,  -Joseline

## 2016-10-23 NOTE — Telephone Encounter (Signed)
Prescription called into the pharmacy. Patient advised as below and verbally voiced understanding.

## 2016-10-28 ENCOUNTER — Encounter: Payer: Self-pay | Admitting: Internal Medicine

## 2016-11-03 ENCOUNTER — Other Ambulatory Visit: Payer: Self-pay | Admitting: Internal Medicine

## 2016-11-03 ENCOUNTER — Encounter: Payer: Self-pay | Admitting: Internal Medicine

## 2016-11-03 ENCOUNTER — Ambulatory Visit (INDEPENDENT_AMBULATORY_CARE_PROVIDER_SITE_OTHER): Payer: Medicaid Other | Admitting: Internal Medicine

## 2016-11-03 VITALS — BP 104/60 | HR 52 | Ht 70.0 in | Wt 189.2 lb

## 2016-11-03 DIAGNOSIS — R300 Dysuria: Secondary | ICD-10-CM

## 2016-11-03 DIAGNOSIS — Z0000 Encounter for general adult medical examination without abnormal findings: Secondary | ICD-10-CM | POA: Diagnosis not present

## 2016-11-03 LAB — MICROSCOPIC EXAMINATION
Bacteria, UA: NONE SEEN
CASTS: NONE SEEN /LPF
Epithelial Cells (non renal): NONE SEEN /hpf (ref 0–10)

## 2016-11-03 LAB — URINALYSIS, ROUTINE W REFLEX MICROSCOPIC
BILIRUBIN UA: NEGATIVE
Glucose, UA: NEGATIVE
Ketones, UA: NEGATIVE
Nitrite, UA: NEGATIVE
PH UA: 5.5 (ref 5.0–7.5)
Specific Gravity, UA: 1.021 (ref 1.005–1.030)
UUROB: 0.2 mg/dL (ref 0.2–1.0)

## 2016-11-03 NOTE — Patient Instructions (Signed)
Medication Instructions:  Your physician has recommended you make the following change in your medication:  1) STOP Amiodarone    Labwork: Your physician recommends that you return for lab work in: UA with Microscopic and Cultures   Testing/Procedures: none  Follow-Up: Your physician recommends that you schedule a follow-up appointment in: 6 weeks with Dr. Lovena Le.   Any Other Special Instructions Will Be Listed Below (If Applicable).     If you need a refill on your cardiac medications before your next appointment, please call your pharmacy.
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
## 2016-11-04 LAB — CUP PACEART INCLINIC DEVICE CHECK
Battery Remaining Longevity: 106 mo
Battery Voltage: 3.01 V
Brady Statistic RV Percent Paced: 1 %
Date Time Interrogation Session: 20180403152254
HIGH POWER IMPEDANCE MEASURED VALUE: 73 Ohm
Lead Channel Impedance Value: 551 Ohm
Lead Channel Impedance Value: 589 Ohm
Lead Channel Sensing Intrinsic Amplitude: 10.125 mV
Lead Channel Sensing Intrinsic Amplitude: 8.375 mV
Lead Channel Setting Pacing Amplitude: 2 V
Lead Channel Setting Sensing Sensitivity: 0.3 mV
MDC IDC LEAD IMPLANT DT: 20150318
MDC IDC LEAD LOCATION: 753860
MDC IDC LEAD SERIAL: 327195
MDC IDC MSMT LEADCHNL RV PACING THRESHOLD AMPLITUDE: 0.75 V
MDC IDC MSMT LEADCHNL RV PACING THRESHOLD PULSEWIDTH: 0.4 ms
MDC IDC PG IMPLANT DT: 20150318
MDC IDC SET LEADCHNL RV PACING PULSEWIDTH: 0.4 ms

## 2016-11-05 LAB — URINE CULTURE: Organism ID, Bacteria: NO GROWTH

## 2016-11-06 ENCOUNTER — Telehealth: Payer: Self-pay | Admitting: Emergency Medicine

## 2016-11-06 DIAGNOSIS — N3000 Acute cystitis without hematuria: Secondary | ICD-10-CM

## 2016-11-06 MED ORDER — SULFAMETHOXAZOLE-TRIMETHOPRIM 800-160 MG PO TABS: 1.0000 | ORAL_TABLET | Freq: Two times a day (BID) | ORAL | 0 refills | Status: DC

## 2016-11-06 NOTE — Telephone Encounter (Signed)
Pt advised.

## 2016-11-06 NOTE — Telephone Encounter (Signed)
Sent in antibiotic to [facility].

## 2016-11-06 NOTE — Telephone Encounter (Signed)
Pt was seen by his cardiologist and they did a urine culture and dip. He was told that he needed to call his PCP for results. Pt is still having dysuria and wants to know if he can get an antibiotic called in.   Linden Dolin

## 2016-11-19 ENCOUNTER — Telehealth: Payer: Self-pay | Admitting: Cardiology

## 2016-11-19 NOTE — Telephone Encounter (Signed)
Pt called and stated that he woke up around 6:00 AM - 6:30 AM and he had some pain around his ICD site. Pt sent a remote transmission. Device Tech RN reviewed the transmission and stated that it was normal, and the pain is probably caused from the pt sleeping on that arm wrong. Informed pt of this information. Pt stated that he walked 3 miles yesterday and wanted to know if that would cause it to be sore. After consulting w/ Device Tech RN informed pt that it would not cause it to be sore. Pt stated that he is out of his IMDUR and Lipitor. I informed him that his medicines wouldn't cause the device site to be sore. Asked pt if the site was red, swollen, or draining. Pt stated that not it wasn't. Informed pt that his remote transmission was normal, walking, and being out of medicine would not cause soreness and that he probably just slept on it wrong. Informed pt that if the site became red, swollen, or started to drain to give the office a call. Pt verbalized understanding.

## 2016-11-23 ENCOUNTER — Other Ambulatory Visit: Payer: Self-pay | Admitting: Family Medicine

## 2016-11-23 DIAGNOSIS — M5489 Other dorsalgia: Secondary | ICD-10-CM

## 2016-11-23 MED ORDER — HYDROCODONE-ACETAMINOPHEN 10-325 MG PO TABS: 1.0000 | ORAL_TABLET | Freq: Four times a day (QID) | ORAL | 0 refills | Status: DC | PRN

## 2016-11-23 NOTE — Telephone Encounter (Signed)
Pt contacted office for refill request on the following medications:  HYDROcodone-acetaminophen (NORCO) 10-325 MG tablet.  QX#[phone]/CG

## 2016-11-26 ENCOUNTER — Telehealth: Payer: Self-pay | Admitting: Family Medicine

## 2016-11-26 DIAGNOSIS — R300 Dysuria: Secondary | ICD-10-CM

## 2016-11-26 MED ORDER — CEPHALEXIN 500 MG PO CAPS: 500.0000 mg | ORAL_CAPSULE | Freq: Four times a day (QID) | ORAL | 0 refills | Status: DC

## 2016-11-26 NOTE — Telephone Encounter (Signed)
Pt called saying he is having uti symptoms again.  He states this is the 5th time since his surgery 2 moths.  Please advise  [place]  Thanks [name]

## 2016-11-26 NOTE — Telephone Encounter (Signed)
Have sent prescription for cephalexin to pharmacy. Need office visit if not much improved in a day or two.

## 2016-11-26 NOTE — Telephone Encounter (Signed)
Advised  ED 

## 2016-11-26 NOTE — Telephone Encounter (Signed)
Please advise 

## 2016-12-02 ENCOUNTER — Other Ambulatory Visit: Payer: Self-pay | Admitting: *Deleted

## 2016-12-02 MED ORDER — CEPHALEXIN 500 MG PO CAPS: 500.0000 mg | ORAL_CAPSULE | Freq: Four times a day (QID) | ORAL | 0 refills | Status: DC

## 2016-12-07 ENCOUNTER — Encounter: Payer: Self-pay | Admitting: Internal Medicine

## 2016-12-07 ENCOUNTER — Telehealth: Payer: Self-pay | Admitting: Family Medicine

## 2016-12-07 DIAGNOSIS — M5489 Other dorsalgia: Secondary | ICD-10-CM

## 2016-12-07 NOTE — Telephone Encounter (Signed)
PA was approved on 12/04/2016 for 6 months for norco 10-325 mg.

## 2016-12-07 NOTE — Telephone Encounter (Signed)
Pt states he could only get 28 of the HYDROcodone-acetaminophen (NORCO) 10-325 MG tablet due to a prior auth needing to be done.  Pt states he took his last 2 pills this morning and is out.  Pt is concerned with having withdrawals.  FQ#[phone]/ZF

## 2016-12-07 NOTE — Telephone Encounter (Signed)
Ok to dispense 28 tablets. Please call and find out status of prior authorization.

## 2016-12-07 NOTE — Telephone Encounter (Signed)
Prior auth.was done last week on this medication per Sharyn Lull. We are still waiting to hear back from the insurance. Patient was advised of this and wants to know what he should do since he only has 2 pill left this morning. Patient is concerned about withdrawals.

## 2016-12-07 NOTE — Telephone Encounter (Signed)
[name] called the pharmacy and they told him they cold fill a rx for 28 pills for right now until the PA comes through.  [name]

## 2016-12-08 MED ORDER — HYDROCODONE-ACETAMINOPHEN 10-325 MG PO TABS: 1.0000 | ORAL_TABLET | Freq: Four times a day (QID) | ORAL | 0 refills | Status: DC | PRN

## 2016-12-08 NOTE — Telephone Encounter (Signed)
Patient needs a new prescription printed with usual qty now that the PA has been approved.

## 2016-12-14 NOTE — Telephone Encounter (Signed)
Pt called back saying he is still not much better with the UTI and wants a referral to a urologist.  He also wants a refill on rx of antibiotic.  Not the same antibiotic as last time though.  Estelle June [address] [place]. [phone]  Pt's call back is [phone]  Thanks [name]

## 2016-12-14 NOTE — Telephone Encounter (Signed)
Please advise 

## 2016-12-14 NOTE — Telephone Encounter (Signed)
Needs o.v. U/a and urine culture.

## 2016-12-14 NOTE — Telephone Encounter (Signed)
Patient refused ov. Patient wants to get referral to urology instead. Please advise?

## 2016-12-14 NOTE — Telephone Encounter (Signed)
I told patient that Dr. Caryn Section said he needed to come in.  He said no that he wanted a referral this time.  Patients call back is [phone]  Thank sTeri

## 2016-12-16 NOTE — Telephone Encounter (Signed)
Pt has returned call again.  He wants a referral.  Please advise  [phone]  Thanks teri

## 2016-12-16 NOTE — Telephone Encounter (Signed)
Please advise 

## 2016-12-17 ENCOUNTER — Encounter: Payer: Self-pay | Admitting: Family Medicine

## 2016-12-17 ENCOUNTER — Ambulatory Visit (INDEPENDENT_AMBULATORY_CARE_PROVIDER_SITE_OTHER): Payer: Medicaid Other | Admitting: Family Medicine

## 2016-12-17 VITALS — BP 120/94 | HR 76 | Temp 98.0°F | Resp 16 | Wt 194.0 lb

## 2016-12-17 DIAGNOSIS — F321 Major depressive disorder, single episode, moderate: Secondary | ICD-10-CM | POA: Diagnosis not present

## 2016-12-17 DIAGNOSIS — R319 Hematuria, unspecified: Secondary | ICD-10-CM

## 2016-12-17 DIAGNOSIS — N390 Urinary tract infection, site not specified: Secondary | ICD-10-CM

## 2016-12-17 LAB — POCT URINALYSIS DIPSTICK
Glucose, UA: NEGATIVE
KETONES UA: NEGATIVE
Nitrite, UA: NEGATIVE
PH UA: 6 (ref 5.0–8.0)
SPEC GRAV UA: 1.02 (ref 1.010–1.025)
Urobilinogen, UA: 0.2 E.U./dL

## 2016-12-17 MED ORDER — CIPROFLOXACIN HCL 500 MG PO TABS
500.0000 mg | ORAL_TABLET | Freq: Two times a day (BID) | ORAL | 0 refills | Status: AC
Start: 2016-12-17 — End: 2016-12-27

## 2016-12-17 MED ORDER — SERTRALINE HCL 50 MG PO TABS: ORAL_TABLET | ORAL | 1 refills | Status: DC

## 2016-12-17 NOTE — Progress Notes (Signed)
Patient: Richard Cook Male    DOB: [birth year]   61 y.o.   MRN: [id number] Visit Date: 12/17/2016  Today's Provider: Lelon Huh, MD   Chief Complaint  Patient presents with  . Urinary Tract Infection   Subjective:    Patient is here to discuss recurrent UTI.   Urinary Tract Infection   This is a recurrent problem. The current episode started more than 1 month ago. The problem has been unchanged. The quality of the pain is described as burning. The pain is moderate. There has been no fever. Associated symptoms include frequency, hematuria and urgency. Pertinent negatives include no chills, discharge, flank pain, hesitancy, nausea, possible pregnancy, sweats or vomiting. He has tried antibiotics for the symptoms. The treatment provided no relief. His past medical history is significant for recurrent UTIs.   Patient states he has had seven urinary tract infections since his hernia surgery in the January. Has had several ER visit. He states urine is milky in consistency and having suprapubic pain and burning with urination. Has had several rounds of cephalexin and symptoms usually resolve while taking antibiotic, but quickly return after finishing.   He also states he has been having more depression. Was prescribed paroxetine in March, but he stopped after a week, because he didn't think it was helping. Had no adverse effects from medication.     Allergies  Allergen Reactions  . Eggs Or Egg-Derived Products Hives  . Codeine Other (See Comments)    Tolerates hydrocodone     Current Outpatient Prescriptions:  .  aspirin EC 81 MG tablet, Take 81 mg by mouth daily., Disp: , Rfl:  .  atorvastatin (LIPITOR) 20 MG tablet, Take 1 tablet (20 mg total) by mouth daily., Disp: 30 tablet, Rfl: 6 .  clonazePAM (KLONOPIN) 2 MG tablet, Take 1 tablet (2 mg total) by mouth 3 (three) times daily as needed for anxiety., Disp: 90 tablet, Rfl: 1 .  furosemide (LASIX) 40 MG tablet, Take 1  tablet (40 mg total) by mouth as directed. Take 1 tab every Friday and every Tuesday (Patient not taking: Reported on 11/03/2016), Disp: , Rfl:  .  HYDROcodone-acetaminophen (NORCO) 10-325 MG tablet, Take 1 tablet by mouth every 6 (six) hours as needed for moderate pain., Disp: 60 tablet, Rfl: 0 .  isosorbide mononitrate (IMDUR) 30 MG 24 hr tablet, Take 30 mg by mouth daily., Disp: , Rfl:  .  Omega-3 Fatty Acids (FISH OIL PO), Take 1 capsule by mouth daily., Disp: , Rfl:  .  PARoxetine (PAXIL) 40 MG tablet, Take 1 tablet (40 mg total) by mouth daily., Disp: 30 tablet, Rfl: 2  Review of Systems  Constitutional: Negative for appetite change, chills and fever.  Respiratory: Negative for chest tightness, shortness of breath and wheezing.   Cardiovascular: Negative for chest pain and palpitations.  Gastrointestinal: Negative for abdominal pain, nausea and vomiting.  Genitourinary: Positive for frequency, hematuria and urgency. Negative for flank pain and hesitancy.    Social History  Substance Use Topics  . Smoking status: Never Smoker  . Smokeless tobacco: Never Used  . Alcohol use No     Comment: 10/07/2016 "quit in the early 1990s"   Objective:   There were no vitals taken for this visit. There were no vitals filed for this visit.   Physical Exam  General Appearance:    Alert, cooperative, no distress  Eyes:    PERRL, conjunctiva/corneas clear, EOM's intact  Lungs:     Clear to auscultation bilaterally, respirations unlabored  Heart:    Regular rate and rhythm  Abdomen:   bowel sounds present and normal in all 4 quadrants. No CVA tenderness     Results for orders placed or performed in visit on 12/17/16  POCT urinalysis dipstick  Result Value Ref Range   Color, UA Yellow    Clarity, UA Cloudy    Glucose, UA Neg    Bilirubin, UA Small    Ketones, UA Neg    Spec Grav, UA 1.020 1.010 - 1.025   Blood, UA Moderate    pH, UA 6.0 5.0 - 8.0   Protein, UA 30+    Urobilinogen, UA  0.2 0.2 or 1.0 E.U./dL   Nitrite, UA Neg    Leukocytes, UA Large (3+) (A) Negative       Assessment & Plan:     1. Urinary tract infection with hematuria, site unspecified Recurrent infections over the last 5 months. Urology referral in progress.  - POCT urinalysis dipstick - CULTURE, URINE COMPREHENSIVE  2. Depression, major, single episode, moderate ([facility]) start - sertraline (ZOLOFT) 50 MG tablet; 1/2 tablet for 6 day, then increase to 1 tablet daily for 6 days, then 1 1/2 tablet for 6 days, then 2 tablets daily  Dispense: 30 tablet; Refill: 1  Follow up in about 3 weeks.       Lelon Huh, MD  [facility]

## 2016-12-20 ENCOUNTER — Other Ambulatory Visit: Payer: Self-pay | Admitting: Family Medicine

## 2016-12-20 LAB — CULTURE, URINE COMPREHENSIVE

## 2016-12-20 NOTE — Telephone Encounter (Signed)
Please call in clonazepam  

## 2016-12-21 ENCOUNTER — Other Ambulatory Visit: Payer: Self-pay | Admitting: Family Medicine

## 2016-12-21 ENCOUNTER — Telehealth: Payer: Self-pay

## 2016-12-21 ENCOUNTER — Telehealth: Payer: Self-pay | Admitting: Family Medicine

## 2016-12-21 MED ORDER — CLONAZEPAM 2 MG PO TABS: 2.0000 mg | ORAL_TABLET | Freq: Three times a day (TID) | ORAL | 3 refills | Status: DC | PRN

## 2016-12-21 NOTE — Telephone Encounter (Signed)
Rx called in to pharmacy. 

## 2016-12-21 NOTE — Telephone Encounter (Signed)
Please call in clonazepam  

## 2016-12-21 NOTE — Telephone Encounter (Signed)
Pt needs refill  clonazePAM (KLONOPIN) 2 MG tablet  [facility]  [address]  Thanks Mitchell

## 2016-12-21 NOTE — Telephone Encounter (Signed)
-----   Message from Birdie Sons, MD sent at 12/20/2016  8:40 AM EDT ----- Urine culture shows infection sensitive to antibiotic that was prescribed. Symptoms should completely resolve by the time antibiotic is finished. Call back otherwise. Need to see urology. Judson Roch is working on referral and he should here back from her in the next day or two.

## 2016-12-21 NOTE — Telephone Encounter (Signed)
Pt advised.  He says he has an appointment with urology 12/29/2016  Thanks,   -Mickel Baas

## 2016-12-22 ENCOUNTER — Encounter: Payer: Self-pay | Admitting: Internal Medicine

## 2016-12-29 ENCOUNTER — Ambulatory Visit: Payer: Medicaid Other | Admitting: Urology

## 2016-12-30 ENCOUNTER — Encounter: Payer: Self-pay | Admitting: Family Medicine

## 2016-12-30 ENCOUNTER — Ambulatory Visit (INDEPENDENT_AMBULATORY_CARE_PROVIDER_SITE_OTHER): Payer: Medicaid Other | Admitting: Family Medicine

## 2016-12-30 VITALS — BP 124/70 | HR 68 | Temp 98.6°F | Resp 16 | Wt 191.0 lb

## 2016-12-30 DIAGNOSIS — M546 Pain in thoracic spine: Secondary | ICD-10-CM

## 2016-12-30 DIAGNOSIS — M5489 Other dorsalgia: Secondary | ICD-10-CM | POA: Diagnosis not present

## 2016-12-30 MED ORDER — HYDROCODONE-ACETAMINOPHEN 10-325 MG PO TABS: 1.0000 | ORAL_TABLET | Freq: Four times a day (QID) | ORAL | 0 refills | Status: DC | PRN

## 2016-12-30 MED ORDER — CYCLOBENZAPRINE HCL 5 MG PO TABS: 5.0000 mg | ORAL_TABLET | Freq: Three times a day (TID) | ORAL | 1 refills | Status: DC | PRN
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
## 2017-01-11 ENCOUNTER — Telehealth: Payer: Self-pay | Admitting: Urology

## 2017-01-11 NOTE — Telephone Encounter (Signed)
Patient called and left a message on 01-08-17 that he wanted to resched the new patient app that he missed with Larene [place]. I called him back and left a message for him to call the office to reschedule that appointment. If he calls he can just reschedule it.   Thanks, Sharyn Lull

## 2017-01-20 ENCOUNTER — Encounter: Payer: Self-pay | Admitting: Family Medicine

## 2017-01-20 ENCOUNTER — Ambulatory Visit: Payer: Medicaid Other | Admitting: Family Medicine

## 2017-01-20 ENCOUNTER — Ambulatory Visit (INDEPENDENT_AMBULATORY_CARE_PROVIDER_SITE_OTHER): Payer: Medicaid Other | Admitting: Family Medicine

## 2017-01-20 DIAGNOSIS — M5489 Other dorsalgia: Secondary | ICD-10-CM

## 2017-01-20 MED ORDER — HYDROCODONE-ACETAMINOPHEN 10-325 MG PO TABS: 1.0000 | ORAL_TABLET | Freq: Four times a day (QID) | ORAL | 0 refills | Status: DC | PRN

## 2017-01-20 MED ORDER — CYCLOBENZAPRINE HCL 5 MG PO TABS: ORAL_TABLET | ORAL | 1 refills | Status: DC

## 2017-01-20 MED ORDER — CYCLOBENZAPRINE HCL 5 MG PO TABS
ORAL_TABLET | ORAL | 1 refills | Status: DC
Start: 2017-01-20 — End: 2017-02-10
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
## 2017-01-26 ENCOUNTER — Ambulatory Visit: Payer: Medicaid Other | Admitting: Urology
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
## 2017-01-27 ENCOUNTER — Ambulatory Visit (INDEPENDENT_AMBULATORY_CARE_PROVIDER_SITE_OTHER): Payer: Medicaid Other | Admitting: Urology

## 2017-01-27 ENCOUNTER — Encounter: Payer: Self-pay | Admitting: Urology

## 2017-01-27 VITALS — BP 123/70 | HR 102 | Ht 70.0 in | Wt 185.0 lb

## 2017-01-27 DIAGNOSIS — N390 Urinary tract infection, site not specified: Secondary | ICD-10-CM | POA: Diagnosis not present

## 2017-01-27 DIAGNOSIS — R102 Pelvic and perineal pain: Secondary | ICD-10-CM

## 2017-01-27 LAB — BLADDER SCAN AMB NON-IMAGING: Scan Result: 39

## 2017-01-28 LAB — URINALYSIS, COMPLETE
BILIRUBIN UA: NEGATIVE
GLUCOSE, UA: NEGATIVE
KETONES UA: NEGATIVE
Leukocytes, UA: NEGATIVE
Nitrite, UA: NEGATIVE
Protein, UA: NEGATIVE
RBC, UA: NEGATIVE
Specific Gravity, UA: 1.01 (ref 1.005–1.030)
UUROB: 0.2 mg/dL (ref 0.2–1.0)
pH, UA: 5.5 (ref 5.0–7.5)

## 2017-02-02 ENCOUNTER — Ambulatory Visit (INDEPENDENT_AMBULATORY_CARE_PROVIDER_SITE_OTHER): Payer: Medicaid Other | Admitting: *Deleted

## 2017-02-02 DIAGNOSIS — I472 Ventricular tachycardia, unspecified: Secondary | ICD-10-CM

## 2017-02-02 NOTE — Progress Notes (Signed)
Remote ICD transmission.   

## 2017-02-05 ENCOUNTER — Encounter: Payer: Self-pay | Admitting: Cardiology

## 2017-02-10 ENCOUNTER — Other Ambulatory Visit: Payer: Self-pay | Admitting: Family Medicine

## 2017-02-10 DIAGNOSIS — M5489 Other dorsalgia: Secondary | ICD-10-CM

## 2017-02-10 MED ORDER — FUROSEMIDE 40 MG PO TABS: 40.0000 mg | ORAL_TABLET | Freq: Every day | ORAL | 3 refills | Status: DC

## 2017-02-10 MED ORDER — CYCLOBENZAPRINE HCL 5 MG PO TABS: ORAL_TABLET | ORAL | 3 refills | Status: DC

## 2017-02-10 NOTE — Telephone Encounter (Signed)
Pt contacted office for refill request on the following medications:  CB#[phone]/MW  Hydrocodone 10-325  cyclobenzaprine (FLEXERIL) 5 MG tablet  [facility] [address].    furosemide (LASIX) 40 MG tablet.  [place]

## 2017-02-10 NOTE — Telephone Encounter (Signed)
Please review. Hydrocodone is not listed on patient's medication list. Pharmacy was also updated. Thanks!

## 2017-02-11 NOTE — Telephone Encounter (Signed)
Last fill for Hydrocodone was 01/20/17. It was taking out of his current RX list-aa

## 2017-02-11 NOTE — Addendum Note (Signed)
Addended by: Arnette Norris on: 02/11/2017 03:30 PM   Modules accepted: Orders

## 2017-02-12 ENCOUNTER — Emergency Department: Payer: Medicaid Other

## 2017-02-12 ENCOUNTER — Ambulatory Visit (INDEPENDENT_AMBULATORY_CARE_PROVIDER_SITE_OTHER): Payer: Medicaid Other | Admitting: Family Medicine

## 2017-02-12 ENCOUNTER — Inpatient Hospital Stay
Admission: EM | Admit: 2017-02-12 | Discharge: 2017-02-16 | DRG: 308 | Disposition: A | Discharge status: Home / Self Care | Payer: Medicaid Other | Attending: Specialist | Admitting: Specialist

## 2017-02-12 VITALS — BP 104/76 | HR 138 | Temp 98.0°F | Resp 16 | Wt 194.0 lb

## 2017-02-12 DIAGNOSIS — I25810 Atherosclerosis of coronary artery bypass graft(s) without angina pectoris: Secondary | ICD-10-CM | POA: Diagnosis not present

## 2017-02-12 DIAGNOSIS — Z951 Presence of aortocoronary bypass graft: Secondary | ICD-10-CM | POA: Diagnosis not present

## 2017-02-12 DIAGNOSIS — Z9049 Acquired absence of other specified parts of digestive tract: Secondary | ICD-10-CM

## 2017-02-12 DIAGNOSIS — N183 Chronic kidney disease, stage 3 unspecified: Secondary | ICD-10-CM | POA: Diagnosis present

## 2017-02-12 DIAGNOSIS — Z7982 Long term (current) use of aspirin: Secondary | ICD-10-CM | POA: Diagnosis not present

## 2017-02-12 DIAGNOSIS — Z86718 Personal history of other venous thrombosis and embolism: Secondary | ICD-10-CM | POA: Diagnosis not present

## 2017-02-12 DIAGNOSIS — I472 Ventricular tachycardia, unspecified: Secondary | ICD-10-CM

## 2017-02-12 DIAGNOSIS — M25431 Effusion, right wrist: Secondary | ICD-10-CM | POA: Diagnosis not present

## 2017-02-12 DIAGNOSIS — I360 Nonrheumatic tricuspid (valve) stenosis: Secondary | ICD-10-CM | POA: Diagnosis not present

## 2017-02-12 DIAGNOSIS — I5042 Chronic combined systolic (congestive) and diastolic (congestive) heart failure: Secondary | ICD-10-CM | POA: Diagnosis present

## 2017-02-12 DIAGNOSIS — I130 Hypertensive heart and chronic kidney disease with heart failure and stage 1 through stage 4 chronic kidney disease, or unspecified chronic kidney disease: Secondary | ICD-10-CM | POA: Diagnosis present

## 2017-02-12 DIAGNOSIS — Z811 Family history of alcohol abuse and dependence: Secondary | ICD-10-CM | POA: Diagnosis not present

## 2017-02-12 DIAGNOSIS — I252 Old myocardial infarction: Secondary | ICD-10-CM

## 2017-02-12 DIAGNOSIS — Z95810 Presence of automatic (implantable) cardiac defibrillator: Secondary | ICD-10-CM | POA: Diagnosis not present

## 2017-02-12 DIAGNOSIS — Z9889 Other specified postprocedural states: Secondary | ICD-10-CM

## 2017-02-12 DIAGNOSIS — M549 Dorsalgia, unspecified: Secondary | ICD-10-CM | POA: Diagnosis present

## 2017-02-12 DIAGNOSIS — Z79899 Other long term (current) drug therapy: Secondary | ICD-10-CM | POA: Diagnosis not present

## 2017-02-12 DIAGNOSIS — G8929 Other chronic pain: Secondary | ICD-10-CM | POA: Diagnosis present

## 2017-02-12 DIAGNOSIS — N170 Acute kidney failure with tubular necrosis: Secondary | ICD-10-CM | POA: Diagnosis present

## 2017-02-12 DIAGNOSIS — M109 Gout, unspecified: Secondary | ICD-10-CM | POA: Diagnosis present

## 2017-02-12 DIAGNOSIS — R0902 Hypoxemia: Secondary | ICD-10-CM | POA: Diagnosis present

## 2017-02-12 DIAGNOSIS — Z8249 Family history of ischemic heart disease and other diseases of the circulatory system: Secondary | ICD-10-CM | POA: Diagnosis not present

## 2017-02-12 DIAGNOSIS — I255 Ischemic cardiomyopathy: Secondary | ICD-10-CM | POA: Diagnosis present

## 2017-02-12 DIAGNOSIS — Z87440 Personal history of urinary (tract) infections: Secondary | ICD-10-CM

## 2017-02-12 DIAGNOSIS — E876 Hypokalemia: Secondary | ICD-10-CM | POA: Diagnosis present

## 2017-02-12 DIAGNOSIS — G4733 Obstructive sleep apnea (adult) (pediatric): Secondary | ICD-10-CM | POA: Diagnosis present

## 2017-02-12 DIAGNOSIS — I2510 Atherosclerotic heart disease of native coronary artery without angina pectoris: Secondary | ICD-10-CM | POA: Diagnosis present

## 2017-02-12 DIAGNOSIS — I959 Hypotension, unspecified: Secondary | ICD-10-CM | POA: Diagnosis present

## 2017-02-12 DIAGNOSIS — I248 Other forms of acute ischemic heart disease: Secondary | ICD-10-CM | POA: Diagnosis present

## 2017-02-12 DIAGNOSIS — M069 Rheumatoid arthritis, unspecified: Secondary | ICD-10-CM | POA: Diagnosis present

## 2017-02-12 DIAGNOSIS — Z955 Presence of coronary angioplasty implant and graft: Secondary | ICD-10-CM | POA: Diagnosis not present

## 2017-02-12 LAB — COMPREHENSIVE METABOLIC PANEL
ALK PHOS: 75 U/L (ref 38–126)
ALT: 12 U/L — AB (ref 17–63)
AST: 19 U/L (ref 15–41)
Albumin: 4 g/dL (ref 3.5–5.0)
Anion gap: 11 (ref 5–15)
BUN: 47 mg/dL — AB (ref 6–20)
CALCIUM: 9.2 mg/dL (ref 8.9–10.3)
CHLORIDE: 100 mmol/L — AB (ref 101–111)
CO2: 28 mmol/L (ref 22–32)
CREATININE: 3.37 mg/dL — AB (ref 0.61–1.24)
GFR calc non Af Amer: 18 mL/min — ABNORMAL LOW (ref >60)
GFR, EST AFRICAN AMERICAN: 21 mL/min — AB (ref >60)
Glucose, Bld: 94 mg/dL (ref 65–99)
Potassium: 3.4 mmol/L — ABNORMAL LOW (ref 3.5–5.1)
SODIUM: 139 mmol/L (ref 135–145)
Total Bilirubin: 1.2 mg/dL (ref 0.3–1.2)
Total Protein: 7.7 g/dL (ref 6.5–8.1)

## 2017-02-12 LAB — MRSA PCR SCREENING: MRSA by PCR: NEGATIVE

## 2017-02-12 LAB — CBC WITH DIFFERENTIAL/PLATELET
BASOS ABS: 0 10*3/uL (ref 0–0.1)
Basophils Relative: 1 %
EOS ABS: 0.1 10*3/uL (ref 0–0.7)
Eosinophils Relative: 3 %
HCT: 46.5 % (ref 40.0–52.0)
HEMOGLOBIN: 15.7 g/dL (ref 13.0–18.0)
LYMPHS ABS: 0.9 10*3/uL — AB (ref 1.0–3.6)
Lymphocytes Relative: 39 %
MCH: 32.1 pg (ref 26.0–34.0)
MCHC: 33.7 g/dL (ref 32.0–36.0)
MCV: 95.3 fL (ref 80.0–100.0)
Monocytes Absolute: 0.1 10*3/uL — ABNORMAL LOW (ref 0.2–1.0)
Monocytes Relative: 5 %
NEUTROS PCT: 52 %
Neutro Abs: 1.2 10*3/uL — ABNORMAL LOW (ref 1.4–6.5)
PLATELETS: 171 10*3/uL (ref 150–440)
RBC: 4.88 MIL/uL (ref 4.40–5.90)
RDW: 14.2 % (ref 11.5–14.5)
WBC: 2.3 10*3/uL — AB (ref 3.8–10.6)

## 2017-02-12 LAB — TROPONIN I
Troponin I: <0.03 ng/mL (ref <0.03)
Troponin I: 0.07 ng/mL (ref <0.03)

## 2017-02-12 LAB — URINE DRUG SCREEN, QUALITATIVE (ARMC ONLY)
Amphetamines, Ur Screen: NOT DETECTED
BARBITURATES, UR SCREEN: NOT DETECTED
Benzodiazepine, Ur Scrn: POSITIVE — AB
CANNABINOID 50 NG, UR ~~LOC~~: NOT DETECTED
Cocaine Metabolite,Ur ~~LOC~~: NOT DETECTED
MDMA (Ecstasy)Ur Screen: NOT DETECTED
METHADONE SCREEN, URINE: NOT DETECTED
Opiate, Ur Screen: POSITIVE — AB
Phencyclidine (PCP) Ur S: NOT DETECTED
TRICYCLIC, UR SCREEN: POSITIVE — AB

## 2017-02-12 LAB — MAGNESIUM: Magnesium: 1.8 mg/dL (ref 1.7–2.4)

## 2017-02-12 LAB — GLUCOSE, CAPILLARY
GLUCOSE-CAPILLARY: 59 mg/dL — AB (ref 65–99)
GLUCOSE-CAPILLARY: 72 mg/dL (ref 65–99)

## 2017-02-12 MED ORDER — BISACODYL 5 MG PO TBEC: 5.0000 mg | DELAYED_RELEASE_TABLET | Freq: Every day | ORAL | Status: DC | PRN

## 2017-02-12 MED ORDER — AMIODARONE HCL IN DEXTROSE 360-4.14 MG/200ML-% IV SOLN
INTRAVENOUS | Status: AC
  Administered 2017-02-12: 60 mg/h via INTRAVENOUS
  Filled 2017-02-12: qty 200

## 2017-02-12 MED ORDER — ACETAMINOPHEN 650 MG RE SUPP: 650.0000 mg | Freq: Four times a day (QID) | RECTAL | Status: DC | PRN

## 2017-02-12 MED ORDER — DEXTROSE 5 % IV SOLN: 60.0000 mg/h | Freq: Once | INTRAVENOUS | Status: DC

## 2017-02-12 MED ORDER — MAGNESIUM SULFATE 2 GM/50ML IV SOLN
2.0000 g | Freq: Once | INTRAVENOUS | Status: AC
  Administered 2017-02-12: 2 g via INTRAVENOUS
  Filled 2017-02-12: qty 50

## 2017-02-12 MED ORDER — COLCHICINE 0.6 MG PO TABS: 0.6000 mg | ORAL_TABLET | Freq: Every day | ORAL | 5 refills | Status: DC

## 2017-02-12 MED ORDER — ATORVASTATIN CALCIUM 20 MG PO TABS
20.0000 mg | ORAL_TABLET | Freq: Every day | ORAL | Status: DC
  Administered 2017-02-13 – 2017-02-16 (×4): 20 mg via ORAL
  Filled 2017-02-12 (×4): qty 1

## 2017-02-12 MED ORDER — COLCHICINE 0.6 MG PO TABS
0.6000 mg | ORAL_TABLET | Freq: Every day | ORAL | Status: DC
  Administered 2017-02-12 – 2017-02-16 (×5): 0.6 mg via ORAL
  Filled 2017-02-12 (×6): qty 1

## 2017-02-12 MED ORDER — OMEGA-3-ACID ETHYL ESTERS 1 G PO CAPS
1.0000 g | ORAL_CAPSULE | Freq: Every day | ORAL | Status: DC
  Administered 2017-02-12 – 2017-02-16 (×5): 1 g via ORAL
  Filled 2017-02-12 (×5): qty 1

## 2017-02-12 MED ORDER — ETOMIDATE 2 MG/ML IV SOLN
20.0000 mg | Freq: Once | INTRAVENOUS | Status: AC
  Administered 2017-02-12: 20 mg via INTRAVENOUS

## 2017-02-12 MED ORDER — SODIUM CHLORIDE 0.9 % IV SOLN
INTRAVENOUS | Status: DC
  Administered 2017-02-12 – 2017-02-14 (×3): via INTRAVENOUS

## 2017-02-12 MED ORDER — HYDROCODONE-ACETAMINOPHEN 10-325 MG PO TABS: 1.0000 | ORAL_TABLET | Freq: Three times a day (TID) | ORAL | 0 refills | Status: DC | PRN

## 2017-02-12 MED ORDER — AMIODARONE IV BOLUS ONLY 150 MG/100ML
150.0000 mg | Freq: Once | INTRAVENOUS | Status: AC
  Administered 2017-02-12: 150 mg via INTRAVENOUS

## 2017-02-12 MED ORDER — FUROSEMIDE 20 MG PO TABS: 40.0000 mg | ORAL_TABLET | Freq: Every day | ORAL | Status: DC

## 2017-02-12 MED ORDER — AMIODARONE IV BOLUS ONLY 150 MG/100ML: 150.0000 mg | Freq: Once | INTRAVENOUS | Status: DC

## 2017-02-12 MED ORDER — SODIUM CHLORIDE 0.9 % IV BOLUS (SEPSIS)
1000.0000 mL | Freq: Once | INTRAVENOUS | Status: AC
  Administered 2017-02-12: 1000 mL via INTRAVENOUS

## 2017-02-12 MED ORDER — ASPIRIN EC 81 MG PO TBEC
81.0000 mg | DELAYED_RELEASE_TABLET | Freq: Every day | ORAL | Status: DC
  Administered 2017-02-12 – 2017-02-16 (×5): 81 mg via ORAL
  Filled 2017-02-12 (×5): qty 1

## 2017-02-12 MED ORDER — LIDOCAINE HCL (CARDIAC) 20 MG/ML IV SOLN
100.0000 mg | Freq: Once | INTRAVENOUS | Status: AC
  Administered 2017-02-12: 100 mg via INTRAVENOUS

## 2017-02-12 MED ORDER — ONDANSETRON HCL 4 MG/2ML IJ SOLN: 4.0000 mg | Freq: Four times a day (QID) | INTRAMUSCULAR | Status: DC | PRN

## 2017-02-12 MED ORDER — ACETAMINOPHEN 325 MG PO TABS: 650.0000 mg | ORAL_TABLET | Freq: Four times a day (QID) | ORAL | Status: DC | PRN

## 2017-02-12 MED ORDER — CLONAZEPAM 1 MG PO TABS
2.0000 mg | ORAL_TABLET | Freq: Every day | ORAL | Status: DC | PRN
  Administered 2017-02-12 – 2017-02-14 (×3): 2 mg via ORAL
  Filled 2017-02-12 (×3): qty 2

## 2017-02-12 MED ORDER — HYDROCODONE-ACETAMINOPHEN 10-325 MG PO TABS
1.0000 | ORAL_TABLET | Freq: Four times a day (QID) | ORAL | Status: DC | PRN
  Administered 2017-02-12 – 2017-02-16 (×7): 1 via ORAL
  Filled 2017-02-12 (×8): qty 1

## 2017-02-12 MED ORDER — ENOXAPARIN SODIUM 30 MG/0.3ML ~~LOC~~ SOLN
30.0000 mg | SUBCUTANEOUS | Status: DC
  Administered 2017-02-12 – 2017-02-13 (×2): 30 mg via SUBCUTANEOUS
  Filled 2017-02-12 (×2): qty 0.3

## 2017-02-12 MED ORDER — ONDANSETRON HCL 4 MG PO TABS: 4.0000 mg | ORAL_TABLET | Freq: Four times a day (QID) | ORAL | Status: DC | PRN

## 2017-02-12 MED ORDER — DOCUSATE SODIUM 100 MG PO CAPS
100.0000 mg | ORAL_CAPSULE | Freq: Two times a day (BID) | ORAL | Status: DC
  Administered 2017-02-12 – 2017-02-16 (×7): 100 mg via ORAL
  Filled 2017-02-12 (×7): qty 1

## 2017-02-12 MED ORDER — AMIODARONE HCL IN DEXTROSE 360-4.14 MG/200ML-% IV SOLN
60.0000 mg/h | INTRAVENOUS | Status: DC
  Administered 2017-02-12 (×2): 60 mg/h via INTRAVENOUS
  Filled 2017-02-12: qty 200

## 2017-02-12 MED ORDER — POTASSIUM CHLORIDE CRYS ER 20 MEQ PO TBCR
20.0000 meq | EXTENDED_RELEASE_TABLET | Freq: Once | ORAL | Status: AC
Start: 2017-02-12 — End: 2017-02-12
  Administered 2017-02-12: 20 meq via ORAL
  Filled 2017-02-12: qty 1

## 2017-02-12 NOTE — ED Notes (Signed)
X-ray at bedside

## 2017-02-12 NOTE — ED Triage Notes (Signed)
Pt came to ED via EMS from [facility]. Pt hr rate elevated, found to be in vtach, sent over to ED. HR in 130s. Pt has pacemaker defibrillator.

## 2017-02-12 NOTE — Progress Notes (Signed)
eLink Physician-Brief Progress Note Patient Name: Richard Cook DOB: Mar 31, 1956 MRN: [id number]   Date of Service  02/12/2017  HPI/Events of Note  New patient admitted with ventricular tachycardia. Potassium 3.4 & magnesium 1.8. Cardiology notified of consultation. Started on amiodarone drip. Camera check shows patient awake and on nasal cannula. Appears comfortable. Normotensive.   eICU Interventions  Continuing plan of care as per admitting physician.      Intervention Category Evaluation Type: New Patient Evaluation  Tera Partridge 02/12/2017, 10:28 PM

## 2017-02-12 NOTE — ED Notes (Signed)
Pts sats dropping. Respiratory at bedside bagging. MD at bedside.

## 2017-02-12 NOTE — ED Notes (Signed)
Pt gave [place] permission to contact sister- Gertie Gowda-  [phone]  Unable to reach sister, will continue to try.

## 2017-02-12 NOTE — ED Notes (Signed)
Sats 100% on nonrebreather.

## 2017-02-12 NOTE — Progress Notes (Signed)
Anticoagulation monitoring(Lovenox):  61 yo male ordered Lovenox 40 mg Q24h  Filed Weights   02/12/17 1655  Weight: 194 lb (88 kg)   BMI    Lab Results  Component Value Date   CREATININE 3.37 (H) 02/12/2017   CREATININE 3.92 (H) 09/30/2016   CREATININE 2.67 (H) 09/06/2016   Estimated Creatinine Clearance: 26 mL/min (A) (by C-G formula based on SCr of 3.37 mg/dL (H)). Hemoglobin & Hematocrit     Component Value Date/Time   HGB 15.7 02/12/2017 1656   HGB 16.5 09/30/2016 1028   HCT 46.5 02/12/2017 1656   HCT 48.6 09/30/2016 1028     Per Protocol for Patient with estCrcl < 30 ml/min and BMI < 40, will transition to Lovenox 30 mg Q24h.

## 2017-02-12 NOTE — ED Notes (Signed)
Pt responding when stimulated.

## 2017-02-12 NOTE — Progress Notes (Signed)
Patient: Richard Cook Male    DOB: [birth year]   61 y.o.   MRN: [id number] Visit Date: 02/12/2017  Today's Provider: Lelon Huh, MD   Chief Complaint  Patient presents with  . Hand Pain  . Ankle Pain   Subjective:    HPI  Patient states he developed right wrist, hand and ankle swelling 3 days ago-around 02/09/17. Swelling is worse in the morning and gets better during the day, also has a hot burning sensation present. No redness present. No fever or chills. Upon checking in patient it was noted he was tachycardic. Patient states he has not felt well today, feels fatigue and felt short of breath going up the steps to the office. Denies chest pain or tightness. He had cardiac ablation for ventricular tachycardia by Dr. Lovena Le in March.   BP Readings from Last 3 Encounters:  01/27/17 123/70  01/20/17 110/84  12/30/16 124/70       Allergies  Allergen Reactions  . Eggs Or Egg-Derived Products Hives  . Codeine Other (See Comments)    Tolerates hydrocodone     Current Outpatient Prescriptions:  .  aspirin EC 81 MG tablet, Take 81 mg by mouth daily., Disp: , Rfl:  .  atorvastatin (LIPITOR) 20 MG tablet, Take 1 tablet (20 mg total) by mouth daily., Disp: 30 tablet, Rfl: 6 .  clonazePAM (KLONOPIN) 2 MG tablet, Take 1 tablet (2 mg total) by mouth 3 (three) times daily as needed. for anxiety, Disp: 90 tablet, Rfl: 3 .  cyclobenzaprine (FLEXERIL) 5 MG tablet, One every four hours as needed, Disp: 60 tablet, Rfl: 3 .  furosemide (LASIX) 40 MG tablet, Take 1 tablet (40 mg total) by mouth daily. Or as directed by physician, Disp: 30 tablet, Rfl: 3 .  HYDROcodone-acetaminophen (NORCO) 10-325 MG tablet, Take 1 tablet by mouth every 8 (eight) hours as needed., Disp: 60 tablet, Rfl: 0 .  Omega-3 Fatty Acids (FISH OIL PO), Take 1 capsule by mouth daily., Disp: , Rfl:   Review of Systems  Constitutional: Positive for fatigue.  Respiratory: Positive for shortness of breath  (felt short of breath coming up the steps). Negative for apnea, choking and chest tightness.   Cardiovascular: Negative.   Musculoskeletal: Positive for joint swelling.  Neurological: Positive for weakness. Negative for dizziness, light-headedness and headaches.    Social History  Substance Use Topics  . Smoking status: Never Smoker  . Smokeless tobacco: Never Used  . Alcohol use No     Comment: 10/07/2016 "quit in the early 1990s"   Objective:   BP 104/76   Pulse (!) 138   Temp 98 F (36.7 C)   Resp 16   Wt 194 lb (88 kg)   SpO2 95%   BMI 27.84 kg/m  There were no vitals filed for this visit.   Physical Exam   General Appearance:    Alert, cooperative, awake, lying on exam table in no distress.   Eyes:    PERRL, conjunctiva/corneas clear, EOM's intact       Lungs:     Clear to auscultation bilaterally, respirations unlabored  Heart:    Tachycardic  Neurologic:   Awake, alert, oriented x 3. No apparent focal neurological           defect.   MS:   Mildly swollen and tender right wrist.    EKG: Rapid ventricular response, rate 131    Assessment & Plan:     1. Ventricular tachycardia (  [place]) Patient is hemodynamically stable. Sent directly to ED via EMS and notificed ER triage of impending arrival.  - EKG 12-Lead  2. Wrist swelling, right Likely gouty arthritis. Have sent refill for colchicine.        Lelon Huh, MD  [facility]
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
## 2017-02-12 NOTE — ED Notes (Signed)
Dr. Darl Householder on phone with [employer]. Interrogated pts pacemaker upon arrival

## 2017-02-12 NOTE — ED Notes (Addendum)
Pt now talking without being stimulated. Can follow commands.

## 2017-02-12 NOTE — ED Notes (Signed)
Pt switched over to Richard Cook. Maintaining sats. Alert and oriented x4.

## 2017-02-13 ENCOUNTER — Inpatient Hospital Stay (HOSPITAL_COMMUNITY)
Admit: 2017-02-13 | Discharge: 2017-02-13 | Disposition: A | Discharge status: Home / Self Care | Payer: Medicaid Other | Attending: Adult Health | Admitting: Adult Health

## 2017-02-13 DIAGNOSIS — I5042 Chronic combined systolic (congestive) and diastolic (congestive) heart failure: Secondary | ICD-10-CM

## 2017-02-13 DIAGNOSIS — N183 Chronic kidney disease, stage 3 (moderate): Secondary | ICD-10-CM

## 2017-02-13 DIAGNOSIS — I25810 Atherosclerosis of coronary artery bypass graft(s) without angina pectoris: Secondary | ICD-10-CM

## 2017-02-13 DIAGNOSIS — I360 Nonrheumatic tricuspid (valve) stenosis: Secondary | ICD-10-CM

## 2017-02-13 DIAGNOSIS — I255 Ischemic cardiomyopathy: Secondary | ICD-10-CM

## 2017-02-13 LAB — CBC
HEMATOCRIT: 42.2 % (ref 40.0–52.0)
HEMOGLOBIN: 14 g/dL (ref 13.0–18.0)
MCH: 31.8 pg (ref 26.0–34.0)
MCHC: 33.3 g/dL (ref 32.0–36.0)
MCV: 95.7 fL (ref 80.0–100.0)
PLATELETS: 166 10*3/uL (ref 150–440)
RBC: 4.41 MIL/uL (ref 4.40–5.90)
RDW: 14.3 % (ref 11.5–14.5)
WBC: 6.8 10*3/uL (ref 3.8–10.6)

## 2017-02-13 LAB — TROPONIN I
TROPONIN I: 0.04 ng/mL — AB (ref <0.03)
Troponin I: 0.07 ng/mL (ref <0.03)

## 2017-02-13 LAB — BASIC METABOLIC PANEL
ANION GAP: 9 (ref 5–15)
BUN: 40 mg/dL — ABNORMAL HIGH (ref 6–20)
CHLORIDE: 106 mmol/L (ref 101–111)
CO2: 27 mmol/L (ref 22–32)
Calcium: 9 mg/dL (ref 8.9–10.3)
Creatinine, Ser: 2.87 mg/dL — ABNORMAL HIGH (ref 0.61–1.24)
GFR calc non Af Amer: 22 mL/min — ABNORMAL LOW (ref >60)
GFR, EST AFRICAN AMERICAN: 26 mL/min — AB (ref >60)
Glucose, Bld: 109 mg/dL — ABNORMAL HIGH (ref 65–99)
POTASSIUM: 3.8 mmol/L (ref 3.5–5.1)
SODIUM: 142 mmol/L (ref 135–145)

## 2017-02-13 LAB — MAGNESIUM: Magnesium: 2.4 mg/dL (ref 1.7–2.4)

## 2017-02-13 LAB — GLUCOSE, CAPILLARY: Glucose-Capillary: 107 mg/dL — ABNORMAL HIGH (ref 65–99)

## 2017-02-13 LAB — ECHOCARDIOGRAM COMPLETE
HEIGHTINCHES: 70 in
WEIGHTICAEL: 3079.39 [oz_av]

## 2017-02-13 LAB — PHOSPHORUS: PHOSPHORUS: 3.2 mg/dL (ref 2.5–4.6)

## 2017-02-13 MED ORDER — AMIODARONE HCL IN DEXTROSE 360-4.14 MG/200ML-% IV SOLN: 60.0000 mg/h | INTRAVENOUS | Status: DC

## 2017-02-13 MED ORDER — AMIODARONE HCL IN DEXTROSE 360-4.14 MG/200ML-% IV SOLN: 30.0000 mg/h | INTRAVENOUS | Status: DC

## 2017-02-13 MED ORDER — AMIODARONE HCL IN DEXTROSE 360-4.14 MG/200ML-% IV SOLN
30.0000 mg/h | INTRAVENOUS | Status: DC
  Administered 2017-02-13 – 2017-02-14 (×5): 30 mg/h via INTRAVENOUS
  Filled 2017-02-13 (×4): qty 200

## 2017-02-13 MED ORDER — MEXILETINE HCL 200 MG PO CAPS
200.0000 mg | ORAL_CAPSULE | Freq: Two times a day (BID) | ORAL | Status: DC
  Administered 2017-02-13 – 2017-02-16 (×6): 200 mg via ORAL
  Filled 2017-02-13 (×7): qty 1

## 2017-02-13 MED ORDER — SODIUM CHLORIDE 0.9 % IV BOLUS (SEPSIS)
1000.0000 mL | Freq: Once | INTRAVENOUS | Status: AC
  Administered 2017-02-13: 1000 mL via INTRAVENOUS

## 2017-02-13 NOTE — Progress Notes (Signed)
Patient NS gtt turned up to 125 ml/hr from 50, receiving a 1 ml NS bolus, and continued on Amiodarone gtt. No concerns of pain or discomfort at this time. Chronic back pain improved with hydrocodone earlier. Patient reported he takes it at home.
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
## 2017-02-13 NOTE — Progress Notes (Signed)
    Pt seen & evaluated - maintaining NSR on Amiodarone. Amiodarone had recently been stopped for anorexia - we may need to adjust his treatment algorithm  Full note to follow - will discuss with EP on Call.  Glenetta Hew, MD

## 2017-02-13 NOTE — Progress Notes (Signed)
PULMONARY / CRITICAL CARE MEDICINE   Name: Richard Cook MRN: [id number] DOB: 04-Sep-1955    ADMISSION DATE:  02/12/2017   CONSULTATION DATE:  02/12/2017  REFERRING MD:  Vianne Bulls  Reason: SVT  CHIEF COMPLAINT:  Ventricular tachycardia  HISTORY OF PRESENT ILLNESS:   This is a 61 year old Caucasian male with a known history of supraventricular tachycardia, status post ablation, pacemaker and defibrillator placement, past medical history as indicated below, who presented to the ED via EMS with complaints of palpitations and elevated heart rate in the 130s. Patient's pacemaker/defibrillator did not fire and it was interrogated by [employer]. Upon arrival in the ED. While at the ED, patient became transiently hypoxic respiratory status and mental status improved with supplemental oxygen. He was cardioverted in the emergency room for a heart rate of 138 and a blood pressure of 104/76. Sedimentation rate is currently in the 60s, regular. Patient is awake and has no complaints. ED workup showed hypokalemia of 3.4, and AKI with a creatinine of 3.37. His baseline creatinine is 2.56  SUBJECTIVE: No acute issues overnight, hydrate stable and blood pressure stable. Creatinine trending down  REVIEW OF SYSTEMS:   Constitutional: Negative for fever and chills.  HENT: Negative for congestion and rhinorrhea.  Eyes: Negative for redness and visual disturbance.  Respiratory: Negative for shortness of breath and wheezing.  Cardiovascular: Negative for chest pain, but states that palpitations have resolved   Gastrointestinal: Negative  for nausea , vomiting and abdominal pain and  Loose stools Genitourinary: Negative for dysuria and urgency.  Endocrine: Denies polyuria, polyphagia and heat intolerance Musculoskeletal: Negative for myalgias and arthralgias.  Skin: Negative for pallor and wound.  Neurological: Negative for dizziness and headaches    VITAL SIGNS: BP 107/78   Pulse 62   Temp 98 F  (36.7 C)   Resp 14   Ht 5\' 10"  (1.778 m)   Wt 192 lb 7.4 oz (87.3 kg)   SpO2 97%   BMI 27.62 kg/m   HEMODYNAMICS:    VENTILATOR SETTINGS:    INTAKE / OUTPUT: No intake/output data recorded.  PHYSICAL EXAMINATION: General:  Awake, sitting in bed, eating dinner Neuro:  Alert and oriented 4. No focal deficits HEENT:  PERRLA, trachea midline, no JVD Cardiovascular: Rate and rhythm regular, S1, S2 audible, no murmur, regurg or gallop, +2 pulses, +1 edema bilaterally, left chest wall with palpable AICD/pacemaker Lungs: Normal work of breathing, lungs are clear to auscultation bilaterally Abdomen:  Soft, nontender, positive bowel sounds. Nonfocal Musculoskeletal: Normal range of motion, unable to assess gait Skin:  Warm and dry  LABS:  BMET  Recent Labs Lab 02/12/17 1656 02/13/17 0439  NA 139 142  K 3.4* 3.8  CL 100* 106  CO2 28 27  BUN 47* 40*  CREATININE 3.37* 2.87*  GLUCOSE 94 109*    Electrolytes  Recent Labs Lab 02/12/17 1656 02/13/17 0439  CALCIUM 9.2 9.0  MG 1.8 2.4  PHOS  --  3.2    CBC  Recent Labs Lab 02/12/17 1656 02/13/17 0439  WBC 2.3* 6.8  HGB 15.7 14.0  HCT 46.5 42.2  PLT 171 166    Coag's No results for input(s): APTT, INR in the last 168 hours.  Sepsis Markers No results for input(s): LATICACIDVEN, PROCALCITON, O2SATVEN in the last 168 hours.  ABG No results for input(s): PHART, PCO2ART, PO2ART in the last 168 hours.  Liver Enzymes  Recent Labs Lab 02/12/17 1656  AST 19  ALT 12*  ALKPHOS 75  BILITOT 1.2  ALBUMIN 4.0    Cardiac Enzymes  Recent Labs Lab 02/12/17 1656 02/12/17 2317 02/13/17 0439  TROPONINI <0.03 0.07* 0.07*    Glucose  Recent Labs Lab 02/12/17 2158 02/12/17 2243  GLUCAP 59* 72    Imaging Dg Chest Port 1 View  Result Date: 02/12/2017 CLINICAL DATA:  Ventricular tachycardia. EXAM: PORTABLE CHEST 1 VIEW COMPARISON:  The one-view chest x-ray 05/20/2016 FINDINGS: The heart size is  normal. AICD is stable. Lung volumes are low. Pulmonary vascular congestion is noted. Defibrillator pad is now in place. The patient is status post median sternotomy. IMPRESSION: 1. Low lung volumes and mild pulmonary vascular congestion. 2. A defibrillator pad is now in place. Electronically Signed   By: San Morelle M.D.   On: 02/12/2017 17:58     STUDIES:  2-D echo   CULTURES: None  ANTIBIOTICS: None  SIGNIFICANT EVENTS: 02/12/2017 ED with SVT  LINES/TUBES: Peripheral IVs  DISCUSSION: 61 year old Caucasian male with a known history of SVT, presenting with recurrent SVT, status post ablation and pacemaker/AICD placement, and worsening chronic kidney disease  ASSESSMENT SVT, status post cardioversion; patient has a working AICD/pacemaker Acute on chronic kidney disease. Toxicology positive for tricyclics even though I don't see any in patient's medication list PLAN Continue amiodarone infusion and transitioned to by mouth amiodarone Follow-up cardiology consult. Follow-up 2-D echo Gentle IV fluid resuscitation Trend creatinine Consider nephrology evaluation if kidney function continued to worsen.  Patient is stable for transfer out of the ICU   [facility] ANP-BC Pulmonary and [place] Pager [phone] or [phone]  02/13/2017, 6:23 AM   Seen, examined, agree with above. PCCM will sign off after transfer. Please call if we can be of further assistance  Merton Border, MD PCCM service Mobile [phone] Pager [phone] 02/13/2017 9:17 AM

## 2017-02-13 NOTE — Progress Notes (Signed)
Patient has been awake since admission. Complained of chronic back pain. Had Mg replacement. UDS positive, NP aware. Troponin 0.7, NP aware. Requested pain medication and medication for anxiety earlier in shift. Continue to monitor.

## 2017-02-13 NOTE — Progress Notes (Signed)
FBS up to 109 this AM. Patient was never symptomatic with lower sugar this shift.

## 2017-02-13 NOTE — Progress Notes (Signed)
Patient CBG was 59 on admission. Patient reported he did not eat all day. Rechecked while eating and up to 72.

## 2017-02-13 NOTE — Progress Notes (Signed)
*  PRELIMINARY RESULTS* Echocardiogram 2D Echocardiogram has been performed.  Richard Cook 02/13/2017, 3:39 PM

## 2017-02-13 NOTE — Progress Notes (Signed)
[place] [facility] [place] NAME: Richard Cook    MR#:  [id number]  DATE OF BIRTH:  1955/11/17  SUBJECTIVE:seen at bedside. patient heart rate is in 70s, in amiodarone drip at 30 mg/h. Denies any complaints. No episodes overnight.   CHIEF COMPLAINT:   Chief Complaint  Patient presents with  . vtach    REVIEW OF SYSTEMS:   ROS CONSTITUTIONAL: No fever, fatigue or weakness.  EYES: No blurred or double vision.  EARS, NOSE, AND THROAT: No tinnitus or ear pain.  RESPIRATORY: No cough, shortness of breath, wheezing or hemoptysis.  CARDIOVASCULAR: No chest pain, orthopnea, edema.  GASTROINTESTINAL: No nausea, vomiting, diarrhea or abdominal pain.  GENITOURINARY: No dysuria, hematuria.  ENDOCRINE: No polyuria, nocturia,  HEMATOLOGY: No anemia, easy bruising or bleeding SKIN: No rash or lesion. MUSCULOSKELETAL: No joint pain or arthritis.   NEUROLOGIC: No tingling, numbness, weakness.  PSYCHIATRY: No anxiety or depression.   DRUG ALLERGIES:   Allergies  Allergen Reactions  . Eggs Or Egg-Derived Products Hives  . Codeine Other (See Comments)    Tolerates hydrocodone    VITALS:  Blood pressure 109/86, pulse 68, temperature 98.1 F (36.7 C), temperature source Oral, resp. rate 14, height 5\' 10"  (1.778 m), weight 87.3 kg (192 lb 7.4 oz), SpO2 95 %.  PHYSICAL EXAMINATION:  GENERAL:  61 y.o.-year-old patient lying in the bed with no acute distress.  EYES: Pupils equal, round, reactive to light and accommodation. No scleral icterus. Extraocular muscles intact.  HEENT: Head atraumatic, normocephalic. Oropharynx and nasopharynx clear.  NECK:  Supple, no jugular venous distention. No thyroid enlargement, no tenderness.  LUNGS: Normal breath sounds bilaterally, no wheezing, rales,rhonchi or crepitation. No use of accessory muscles of respiration.  CARDIOVASCULAR: S1, S2 normal. No murmurs, rubs, or gallops.  ABDOMEN: Soft, nontender,  nondistended. Bowel sounds present. No organomegaly or mass.  EXTREMITIES: No pedal edema, cyanosis, or clubbing.  NEUROLOGIC: Cranial nerves II through XII are intact. Muscle strength 5/5 in all extremities. Sensation intact. Gait not checked.  PSYCHIATRIC: The patient is alert and oriented x 3.  SKIN: No obvious rash, lesion, or ulcer.    LABORATORY PANEL:   CBC  Recent Labs Lab 02/13/17 0439  WBC 6.8  HGB 14.0  HCT 42.2  PLT 166   ------------------------------------------------------------------------------------------------------------------  Chemistries   Recent Labs Lab 02/12/17 1656 02/13/17 0439  NA 139 142  K 3.4* 3.8  CL 100* 106  CO2 28 27  GLUCOSE 94 109*  BUN 47* 40*  CREATININE 3.37* 2.87*  CALCIUM 9.2 9.0  MG 1.8 2.4  AST 19  --   ALT 12*  --   ALKPHOS 75  --   BILITOT 1.2  --    ------------------------------------------------------------------------------------------------------------------  Cardiac Enzymes  Recent Labs Lab 02/13/17 0439  TROPONINI 0.07*   ------------------------------------------------------------------------------------------------------------------  RADIOLOGY:  Dg Chest Port 1 View  Result Date: 02/12/2017 CLINICAL DATA:  Ventricular tachycardia. EXAM: PORTABLE CHEST 1 VIEW COMPARISON:  The one-view chest x-ray 05/20/2016 FINDINGS: The heart size is normal. AICD is stable. Lung volumes are low. Pulmonary vascular congestion is noted. Defibrillator pad is now in place. The patient is status post median sternotomy. IMPRESSION: 1. Low lung volumes and mild pulmonary vascular congestion. 2. A defibrillator pad is now in place. Electronically Signed   By: San Morelle M.D.   On: 02/12/2017 17:58    EKG:   Orders placed or performed in visit on 02/12/17  . EKG 12-Lead  ASSESSMENT AND PLAN:  #1 V. tach: Admitted to ICU started on amiodarone drip, shocked 1 time in the emergency room, cardiology consultation  [facility] cardiology for further treatment options, patient can be transferred to telemetry, continue amiodarone drip to seen by cardiology.  #2 acute on chronic renal failure, acute renal failure due to ATN: Gentle hydration, renal function is improving .  Hold Lasix #3 anxiety, chronic pain: Patient is on Klonopin, pain medicine Right wrist gout: Patient has less pain today, continue colchicine as needed.    All the records are reviewed and case discussed with Care Management/Social Workerr. Management plans discussed with the patient, family and they are in agreement.  CODE STATUS: full TOTAL TIME TAKING CARE OF THIS PATIENT: 35 minutes.   POSSIBLE D/C IN 1-2 DAYS, DEPENDING ON CLINICAL CONDITION.   Epifanio Lesches M.D on 02/13/2017 at 9:14 AM  Between 7am to 6pm - Pager - [phone]  After 6pm go to [email or web address] - password [email or web address] [facility]  Office  [phone]  CC: Primary care physician; Leonie Man, MD   Note: This dictation was prepared with Dragon dictation along with smaller phrase technology. Any transcriptional errors that result from this process are unintentional.

## 2017-02-13 NOTE — Progress Notes (Signed)
Verified with NP that amiodarone was suppose to be running at 30 since 60 mg stopped at 2259 and NP wanted medication continued. Reordered medication to indicate so.

## 2017-02-14 LAB — BASIC METABOLIC PANEL
Anion gap: 7 (ref 5–15)
BUN: 30 mg/dL — AB (ref 6–20)
CALCIUM: 8.6 mg/dL — AB (ref 8.9–10.3)
CHLORIDE: 111 mmol/L (ref 101–111)
CO2: 24 mmol/L (ref 22–32)
Creatinine, Ser: 2.29 mg/dL — ABNORMAL HIGH (ref 0.61–1.24)
GFR, EST AFRICAN AMERICAN: 34 mL/min — AB (ref >60)
GFR, EST NON AFRICAN AMERICAN: 29 mL/min — AB (ref >60)
Glucose, Bld: 98 mg/dL (ref 65–99)
Potassium: 3.9 mmol/L (ref 3.5–5.1)
Sodium: 142 mmol/L (ref 135–145)

## 2017-02-14 LAB — GLUCOSE, CAPILLARY: GLUCOSE-CAPILLARY: 85 mg/dL (ref 65–99)

## 2017-02-14 MED ORDER — ENOXAPARIN SODIUM 40 MG/0.4ML ~~LOC~~ SOLN
40.0000 mg | SUBCUTANEOUS | Status: DC
  Administered 2017-02-14 – 2017-02-15 (×2): 40 mg via SUBCUTANEOUS
  Filled 2017-02-14 (×2): qty 0.4

## 2017-02-14 MED ORDER — CLONAZEPAM 1 MG PO TABS
2.0000 mg | ORAL_TABLET | Freq: Two times a day (BID) | ORAL | Status: DC | PRN
  Administered 2017-02-15 – 2017-02-16 (×2): 2 mg via ORAL
  Filled 2017-02-14 (×2): qty 2

## 2017-02-14 NOTE — Progress Notes (Signed)
Patient has blisters on his back where the defibrillator pad was places.  The largest ones have popped.  Cleaned, Tegaderm applied.

## 2017-02-14 NOTE — Progress Notes (Signed)
Patient left arm infiltrated, infusion stopped. Pulses palpable, cap refill less than 3. Arm elevated to help with swelling, Patient refuses compresses. Will continue to monitor.   Iran Sizer M

## 2017-02-14 NOTE — Progress Notes (Signed)
Progress Note  Patient Name: Richard Cook Date of Encounter: 02/14/2017  PCP: Arnoldo Morale, MD Primary Cardiologist: Dr. Jolyn Nap Requesting Provider: Dr. Vianne Bulls  Patient Profile     61 y.o. male  with a history of CAD-CABG in 2014 (last In 2015 showed all 3 grafts patent, nonischemic Myoview in 2016), mild ischemic cardiopathy with a history of VT status post ICD (status post ablation in March 2018 for recurrent V. tach). He also has hypertension, hyperlipidemia and stage III C KD.  He is being seen today for the evaluation of recurrent ventricular tachycardia at the request of Dr. Vianne Bulls - of the hospitalist service.  He was admitted and February 2018 is [facility] with dysuria and UTI and was found to be in a dramatic VT with a rate of 140 BPM. He was defibrillated by the ER using his device. He was given IV amiodarone overnight. Dr. Lovena Le performed VT ablation in March. He was discharged on an amiodarone that was discontinued by Dr. Lovena Le in April.  Principal Problem:   V-tach [facility]) Active Problems:   CAD- PCI to RCA 08/10/11, CABG in [place] 5/14   Cardiomyopathy, ischemic   Chronic combined systolic and diastolic CHF (congestive heart failure) ([place])   Coronary artery disease involving coronary bypass graft without angina pectoris   Chronic kidney disease (CKD), stage III (moderate)   Subjective   Feels fine today. Not having any issues with chest pain or dyspnea. Was walking around without any difficulty. No PND, orthopnea. No irregular heartbeats or palpitations. He had some discomfort at the site of his ICD pads with some blistering. This was dressed  Inpatient Medications    Scheduled Meds: . aspirin EC  81 mg Oral Daily  . atorvastatin  20 mg Oral Daily  . colchicine  0.6 mg Oral Daily  . docusate sodium  100 mg Oral BID  . enoxaparin (LOVENOX) injection  40 mg Subcutaneous Q24H  . mexiletine  200 mg Oral Q12H  . omega-3 acid ethyl esters  1 g Oral Daily    Continuous Infusions: . amiodarone 30 mg/hr (02/14/17 0508)   PRN Meds: acetaminophen **OR** acetaminophen, bisacodyl, clonazePAM, HYDROcodone-acetaminophen, ondansetron **OR** ondansetron (ZOFRAN) IV   Vital Signs    Vitals:   02/13/17 2350 02/14/17 0359 02/14/17 0800 02/14/17 0958  BP: 114/81 108/79 117/85 116/83  Pulse: 67 66 63 64  Resp: 18 18 16 14   Temp: 98.4 F (36.9 C) 97.9 F (36.6 C) 97.8 F (36.6 C) 97.8 F (36.6 C)  TempSrc: Oral Oral Oral Oral  SpO2: 94% 95% 98% 100%  Weight:  200 lb 12.8 oz (91.1 kg)    Height:        Intake/Output Summary (Last 24 hours) at 02/14/17 1315 Last data filed at 02/14/17 1122  Gross per 24 hour  Intake              240 ml  Output             1425 ml  Net            -1185 ml   Filed Weights   02/12/17 2200 02/13/17 0500 02/14/17 0359  Weight: 198 lb 13.7 oz (90.2 kg) 192 lb 7.4 oz (87.3 kg) 200 lb 12.8 oz (91.1 kg)    Telemetry    Sinus rhythm rates in the 60s - Personally Reviewed  ECG    No new EKG - Personally Reviewed  Physical Exam   GEN: No acute distress. Resting comfortably  in bed. Alert and oriented 3.  Neck: No JVD or carotid bruit Cardiac: RRR, soft systolic murmur heard at left lower sternal border (maybe 1/6). Otherwise no M/R/G. Nondisplaced PMI.  Respiratory: Clear to auscultation bilaterally. Nonlabored, good air movement GI: Soft, nontender, non-distended  MS: No edema; No deformity. Neuro:  Nonfocal  Psych: Normal affect   Labs    Chemistry  Recent Labs Lab 02/12/17 1656 02/13/17 0439 02/14/17 0419  NA 139 142 142  K 3.4* 3.8 3.9  CL 100* 106 111  CO2 28 27 24   GLUCOSE 94 109* 98  BUN 47* 40* 30*  CREATININE 3.37* 2.87* 2.29*  CALCIUM 9.2 9.0 8.6*  PROT 7.7  --   --   ALBUMIN 4.0  --   --   AST 19  --   --   ALT 12*  --   --   ALKPHOS 75  --   --   BILITOT 1.2  --   --   GFRNONAA 18* 22* 29*  GFRAA 21* 26* 34*  ANIONGAP 11 9 7      Hematology  Recent Labs Lab  02/12/17 1656 02/13/17 0439  WBC 2.3* 6.8  RBC 4.88 4.41  HGB 15.7 14.0  HCT 46.5 42.2  MCV 95.3 95.7  MCH 32.1 31.8  MCHC 33.7 33.3  RDW 14.2 14.3  PLT 171 166    Cardiac Enzymes  Recent Labs Lab 02/12/17 1656 02/12/17 2317 02/13/17 0439 02/13/17 1103  TROPONINI <0.03 0.07* 0.07* 0.04*   No results for input(s): TROPIPOC in the last 168 hours.   BNPNo results for input(s): BNP, PROBNP in the last 168 hours.   DDimer No results for input(s): DDIMER in the last 168 hours.   Radiology    Dg Chest Port 1 View  Result Date: 02/12/2017 CLINICAL DATA:  Ventricular tachycardia. EXAM: PORTABLE CHEST 1 VIEW COMPARISON:  The one-view chest x-ray 05/20/2016 FINDINGS: The heart size is normal. AICD is stable. Lung volumes are low. Pulmonary vascular congestion is noted. Defibrillator pad is now in place. The patient is status post median sternotomy. IMPRESSION: 1. Low lung volumes and mild pulmonary vascular congestion. 2. A defibrillator pad is now in place. Electronically Signed   By: San Morelle M.D.   On: 02/12/2017 17:58    Cardiac Studies   2D Echo: Mild-Mod LVH. Severely reduced EF of 15-20%. Global hypokinesis. Concerning for grade 3/restrictive physiology diastolic dysfunction. Severely reduced right ventricle. PA pressures mildly reduced. Significant reduction in EF compared to February 2018  Assessment & Plan    Principal Problem:   V-tach [facility]) Active Problems:   CAD- PCI to RCA 08/10/11, [place] in [place] 5[place]   Cardiomyopathy, ischemic   Chronic combined systolic and diastolic CHF (congestive heart failure) ([place])   Coronary artery disease involving coronary bypass graft without angina pectoris   Chronic kidney disease (CKD), stage III (moderate)  Very interesting stuation with the patient has had recurrent episode of V. tach despite having recent ablation. No further episodes since he was converted in the ER. Unfortunately repeat echo shows that his EF is  dramatically reduced compared to February which was also reduction in previous evaluation.  Plan for now for his V. tach was to continue amiodarone IV over weekend, and started mexiletine (I did confirm with Dr. [name] to determine if he was continue based on the reduction in EF). The intention is to have the [employer] representative interrogate his device and potentially change treatment rhythms.  As for his reduced  ejection fraction/new cardiomyopathy, and this may simply be related to his recent V. tach event leading to admission, however we do need to consider excluding an ischemic evaluation. The patient is very upset about these results, and is indicating having significant financial difficulties over the last year with pain pills. He just started going back to work about a month ago, and is worried about losing his job now. He been trying to get disability. Interestingly he has not had any anginal symptoms or CHF symptoms of PND, orthopnea or edema or exertional dyspnea leading up to the day he came in. This would be medically potentially that the cardiomyopathy is simply related to standing of myocardium from V. tach.   The patient would like to think about whether or not he must proceed with further evaluation  mostly based on financial concerns.  At this time my recommendation would be ischemic evaluation with cardiac catheterization (right and left heart cath). We'll need to readdress tomorrow after discussing with Dr. [name]; consideration will also need to be made based on his renal insufficiency. Would be reluctant to take him to the Cath Lab until his creatinine continues to show improvement.  He is not on any (and was not on any) true cardiac medications besides his antiarrhythmics. He had been on Imdur only along with aspirin and statin plus Lovaza.  Need to reevaluate blood pressure, but as he is euvolemic, could consider addition of low-dose beta blocker if blood pressure would  tolerate. This can help in the past for bradycardia, but he does have a defibrillator/pacemaker.  Similarly, with renal insufficiency and concerns bradycardia continue digoxin. ACE inhibitor/ARB has been on hold partially due to hypotension but also due to his CK D. Thankfully, his creatinine has improved since admission.   Signed, Glenetta Hew, MD  02/14/2017, 1:15 PM

## 2017-02-14 NOTE — Progress Notes (Signed)
Anticoagulation monitoring(Lovenox):  61yo  male ordered Lovenox 30 mg Q24h  Filed Weights   02/12/17 2200 02/13/17 0500 02/14/17 0359  Weight: 198 lb 13.7 oz (90.2 kg) 192 lb 7.4 oz (87.3 kg) 200 lb 12.8 oz (91.1 kg)   BMI 28.8   Lab Results  Component Value Date   CREATININE 2.29 (H) 02/14/2017   CREATININE 2.87 (H) 02/13/2017   CREATININE 3.37 (H) 02/12/2017   Estimated Creatinine Clearance: 38.9 mL/min (A) (by C-G formula based on SCr of 2.29 mg/dL (H)). Hemoglobin & Hematocrit     Component Value Date/Time   HGB 14.0 02/13/2017 0439   HGB 16.5 09/30/2016 1028   HCT 42.2 02/13/2017 0439   HCT 48.6 09/30/2016 1028     Per Protocol for Patient with estCrcl > 30 ml/min and BMI < 40, will transition to Lovenox 40 mg Q24h.

## 2017-02-14 NOTE — Progress Notes (Signed)
Patient was told his EF is 15%.  This made him upset.  He is unable to reach his sister by phone.  Klonipin giiven for anxiety
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
## 2017-02-15 ENCOUNTER — Telehealth: Payer: Self-pay | Admitting: Family Medicine

## 2017-02-15 LAB — HIV ANTIBODY (ROUTINE TESTING W REFLEX): HIV Screen 4th Generation wRfx: NONREACTIVE

## 2017-02-15 LAB — GLUCOSE, CAPILLARY: Glucose-Capillary: 70 mg/dL (ref 65–99)

## 2017-02-15 MED ORDER — COLCHICINE 0.6 MG PO CAPS
1.0000 | ORAL_CAPSULE | Freq: Every day | ORAL | 5 refills | Status: DC
Start: 2017-02-15 — End: 2017-02-16

## 2017-02-15 MED ORDER — AMIODARONE HCL 200 MG PO TABS
400.0000 mg | ORAL_TABLET | Freq: Two times a day (BID) | ORAL | Status: DC
  Administered 2017-02-15 – 2017-02-16 (×3): 400 mg via ORAL
  Filled 2017-02-15 (×3): qty 2

## 2017-02-15 NOTE — Progress Notes (Signed)
[place] [facility] [place] NAME: Richard Cook    MR#:  [id number]  DATE OF BIRTH:  [birth year]  SUBJECTIVE:   Patient went to see his primary care physician due to shortness of breath and noted to be tachycardic and EKG showing ventricular tachycardia. Has been on amiodarone drip but lost IV access and no further episodes of ventricular tachycardia. Patient now noted to have worsening LV dysfunction compared to his previous echocardiogram. Clinically denies any chest pain, shortness of breath or any other symptoms presently.  REVIEW OF SYSTEMS:    Review of Systems  Constitutional: Negative for chills and fever.  HENT: Negative for congestion and tinnitus.   Eyes: Negative for blurred vision and double vision.  Respiratory: Negative for cough, shortness of breath and wheezing.   Cardiovascular: Negative for chest pain, orthopnea and PND.  Gastrointestinal: Negative for abdominal pain, diarrhea, nausea and vomiting.  Genitourinary: Negative for dysuria and hematuria.  Neurological: Negative for dizziness, sensory change and focal weakness.  All other systems reviewed and are negative.   Nutrition: Heart Healthy Tolerating Diet: Yes Tolerating PT: Ambulatory  DRUG ALLERGIES:   Allergies  Allergen Reactions  . Eggs Or Egg-Derived Products Hives  . Codeine Other (See Comments)    Tolerates hydrocodone    VITALS:  Blood pressure (!) 136/91, pulse 73, temperature 97.8 F (36.6 C), temperature source Oral, resp. rate 17, height 5\' 10"  (1.778 m), weight 90.5 kg (199 lb 8 oz), SpO2 97 %.  PHYSICAL EXAMINATION:   Physical Exam  GENERAL:  61 y.o.-year-old patient lying in bed in no acute distress.  EYES: Pupils equal, round, reactive to light and accommodation. No scleral icterus. Extraocular muscles intact.  HEENT: Head atraumatic, normocephalic. Oropharynx and nasopharynx clear.  NECK:  Supple, no jugular venous distention. No thyroid  enlargement, no tenderness.  LUNGS: Normal breath sounds bilaterally, no wheezing, rales, rhonchi. No use of accessory muscles of respiration.  CARDIOVASCULAR: S1, S2 normal. No murmurs, rubs, or gallops.  ABDOMEN: Soft, nontender, nondistended. Bowel sounds present. No organomegaly or mass.  EXTREMITIES: No cyanosis, clubbing or edema b/l.    NEUROLOGIC: Cranial nerves II through XII are intact. No focal Motor or sensory deficits b/l.   PSYCHIATRIC: The patient is alert and oriented x 3.  SKIN: No obvious rash, lesion, or ulcer.    LABORATORY PANEL:   CBC  Recent Labs Lab 02/13/17 0439  WBC 6.8  HGB 14.0  HCT 42.2  PLT 166   ------------------------------------------------------------------------------------------------------------------  Chemistries   Recent Labs Lab 02/12/17 1656 02/13/17 0439 02/14/17 0419  NA 139 142 142  K 3.4* 3.8 3.9  CL 100* 106 111  CO2 28 27 24   GLUCOSE 94 109* 98  BUN 47* 40* 30*  CREATININE 3.37* 2.87* 2.29*  CALCIUM 9.2 9.0 8.6*  MG 1.8 2.4  --   AST 19  --   --   ALT 12*  --   --   ALKPHOS 75  --   --   BILITOT 1.2  --   --    ------------------------------------------------------------------------------------------------------------------  Cardiac Enzymes  Recent Labs Lab 02/13/17 1103  TROPONINI 0.04*   ------------------------------------------------------------------------------------------------------------------  RADIOLOGY:  No results found.   ASSESSMENT AND PLAN:   61 year old male with past medical history of coronary artery disease, chronic kidney disease stage III, chronic systolic CHF, rheumatoid arthritis, obstructive sleep apnea, anxiety, status post AICD who presented to the hospital due to ventricular tachycardia.  1. Ventricular tachycardia-patient was noted  to be tachycardic and his primary care physician's office and EKG showed ventricular tachycardia. Patient has an AICD but he was not shocked. Patient  had a recent ablation done. -Seen by cardiology and started on amiodarone drip and no further episodes of ventricular tachycardia. Now weaned off the amiodarone drip and on oral amiodarone. -Continue oral mexiletine, await further input from cardiology and also electrophysiology see patient tomorrow.  2. Acute on chronic renal failure-baseline creatinine around 2.2-2.5. Patient presented with a creatinine of 3.3 now down to 2.2 at baseline. -We'll continue to watch with diuresis.  3. Cardiomyopathy-patient noted to have worsening LV dysfunction based on echocardiogram during this admission.  -Patient's ejection fraction is down from 40-45% to 15-20%. Etiology unclear. -Await further cardiology input but patient denies any anginal symptoms. Questionable need for cardiac catheterization.  4. History of gout-no acute attack. Continue colchicine.  5. Hyperlipidemia-continue atorvastatin.  6. Anxiety-continue Klonopin.  All the records are reviewed and case discussed with Care Management/Social Worker. Management plans discussed with the patient, family and they are in agreement.  CODE STATUS: Full  DVT Prophylaxis: Ted's & SCD's  TOTAL TIME TAKING CARE OF THIS PATIENT: 30 minutes.   POSSIBLE D/C IN 1-2 DAYS, DEPENDING ON CLINICAL CONDITION.   Henreitta Leber M.D on 02/15/2017 at 3:06 PM  Between 7am to 6pm - Pager - [phone]  After 6pm go to [email or web address] - Proofreader  [facility]  Office  [phone]  CC: Primary care physician; Arnoldo Morale, MD

## 2017-02-15 NOTE — Progress Notes (Signed)
Patient complained of pain at a 10 out of 10 at IV site. Some redness present on forearm area above site. IV able to draw back blood and flush with saline. Medication stopped. Received pain medication for IV site and chronic back pain. Attempted to restart IV 5 times by two RNs. Unsuccessful. MD notified. Verbal order for ok to no longer attempt IV and to leave amio gtt off. Will closely monitor heart rhythm and rate. Plan explained to patient. Verbalized understanding. Will monitor.

## 2017-02-15 NOTE — Progress Notes (Signed)
Progress Note  Patient Name: Richard Cook Date of Encounter: 02/15/2017  Primary Cardiologist: Olin Pia, MD   Subjective   No chest pain or sob.  No further VT.  No IV access last night so did not get IV amio.  Inpatient Medications    Scheduled Meds: . aspirin EC  81 mg Oral Daily  . atorvastatin  20 mg Oral Daily  . colchicine  0.6 mg Oral Daily  . docusate sodium  100 mg Oral BID  . enoxaparin (LOVENOX) injection  40 mg Subcutaneous Q24H  . mexiletine  200 mg Oral Q12H  . omega-3 acid ethyl esters  1 g Oral Daily   Continuous Infusions: . amiodarone Stopped (02/14/17 2054)   PRN Meds: acetaminophen **OR** acetaminophen, bisacodyl, clonazePAM, HYDROcodone-acetaminophen, ondansetron **OR** ondansetron (ZOFRAN) IV   Vital Signs    Vitals:   02/14/17 0958 02/14/17 1953 02/15/17 0424 02/15/17 0745  BP: 116/83 120/86 123/86 113/77  Pulse: 64 74 66 94  Resp: 14 18 18 15   Temp: 97.8 F (36.6 C) 98.3 F (36.8 C) 98.4 F (36.9 C) 97.8 F (36.6 C)  TempSrc: Oral Oral Oral Oral  SpO2: 100% 100% 96% 95%  Weight:   199 lb 8 oz (90.5 kg)   Height:        Intake/Output Summary (Last 24 hours) at 02/15/17 1121 Last data filed at 02/15/17 0420  Gross per 24 hour  Intake                0 ml  Output             1125 ml  Net            -1125 ml   Filed Weights   02/13/17 0500 02/14/17 0359 02/15/17 0424  Weight: 192 lb 7.4 oz (87.3 kg) 200 lb 12.8 oz (91.1 kg) 199 lb 8 oz (90.5 kg)    Physical Exam   GEN: Well nourished, well developed, in no acute distress.  HEENT: Grossly normal.  Neck: Supple, no JVD, carotid bruits, or masses. Cardiac: RRR, no murmurs, rubs, or gallops. No clubbing, cyanosis, edema.  Radials/DP/PT 2+ and equal bilaterally.  Respiratory:  Respirations regular and unlabored, clear to auscultation bilaterally. GI: Soft, nontender, nondistended, BS + x 4. MS: no deformity or atrophy. Skin: warm and dry, no rash. Neuro:  Strength and  sensation are intact. Psych: AAOx3.  Normal affect.  Labs    Chemistry Recent Labs Lab 02/12/17 1656 02/13/17 0439 02/14/17 0419  NA 139 142 142  K 3.4* 3.8 3.9  CL 100* 106 111  CO2 28 27 24   GLUCOSE 94 109* 98  BUN 47* 40* 30*  CREATININE 3.37* 2.87* 2.29*  CALCIUM 9.2 9.0 8.6*  PROT 7.7  --   --   ALBUMIN 4.0  --   --   AST 19  --   --   ALT 12*  --   --   ALKPHOS 75  --   --   BILITOT 1.2  --   --   GFRNONAA 18* 22* 29*  GFRAA 21* 26* 34*  ANIONGAP 11 9 7      Hematology Recent Labs Lab 02/12/17 1656 02/13/17 0439  WBC 2.3* 6.8  RBC 4.88 4.41  HGB 15.7 14.0  HCT 46.5 42.2  MCV 95.3 95.7  MCH 32.1 31.8  MCHC 33.7 33.3  RDW 14.2 14.3  PLT 171 166    Cardiac Enzymes Recent Labs Lab 02/12/17 1656 02/12/17 2317 02/13/17  3710 02/13/17 1103  TROPONINI <0.03 0.07* 0.07* 0.04*     Radiology    No results found.  Telemetry    Sinus rhythm - QTc 420 - Personally Reviewed   Cardiac Studies   2D Echocardiogram 7.14.2018  Study Conclusions  - Left ventricle: The cavity size was at the upper limits of   normal. Wall thickness was increased increased in a pattern of   mild to moderate LVH. Systolic function was severely reduced. The   estimated ejection fraction was in the range of 15% to 20%.   Regional wall motion abnormalities cannot be excluded. Doppler   parameters are consistent with restrictive physiology, indicative   of decreased left ventricular diastolic compliance and/or   increased left atrial pressure. - Mitral valve: There was mild regurgitation. - Left atrium: The atrium was mildly dilated. - Right ventricle: Systolic function was moderately reduced. - Right atrium: The atrium was moderately dilated. - Tricuspid valve: There was mild-moderate regurgitation. - Pulmonary arteries: Pulmonary artery pressure is at least mildly   elevated; PA systolic pressure is 34 mmHg plus central venous   pressure.  Impressions:  -  Compared with prior echo from 01/03/68, LV systolic function has   significantly worsened. (EF 40-45% in 09/2016).  Patient Profile     61 y.o. male with a history of CAD-CABG in 2014 (last In 2015 showed all 3 grafts patent, nonischemic Myoview in 2016), mild ischemic cardiomyopathy with a history of VT status post ICD (status post ablation in March 2018 for recurrent V. Tach), HTN,  hyperlipidemia and stage III CKD, who was admitted 7/13 2/2 slow VT.  Assessment & Plan    1.  Recurrent Ventricular Tachycardia:  Pt with a h/o VT s/p ICD and VT ablation in 10/2016.  VT was quiescent on f/u interrogations and amio was d/c'd 11/03/2016 in the setting of "anorexia, fatigue, malaise, sob, and weakness." Pt denies having experienced these symptoms and says that he was losing wt b/c he went on a diet.  Regardless, he has not been on antiarrhythmic therapy since 11/03/2016.  He has remained active and hikes the [place] trail regularly.  He did have mild DOE when walking up steps to his PCP's office on 7/13 and was noted to be in VT @ 138 that day.  He was then cardioverted in the ED.  K was low @ 3.4 in ED.  I have spoken with Dr. [name] again this am to sure up our plan.  He will have the MDT rep adjust ICD settings today.  We will start amio 400 bid today with a plan to wean down to a lower dose quickly.  Cont mexiletine 200 bid - this was started over the weekend.  F/u ECG to better assess QTc.  Keep K/Mg wnl.  With recurrent VT and worsening of LV fxn, Dr. [name] recs ischemic eval with cath.  Mr. Crowson prefers to avoid this if at all possible as he has been feeling and doing well and just recently went back to work and has concerns about finances.  Dr. [name] to see in AM to discuss further.  2.  ICM/HFrEF:  Euvolemic on exam.  With drop in LV fxn, we will need to consider  blocker therapy.  No acei/arb/arni/spiro in the setting of CKD III with acute worsening on admission.  I'm not sure that his bp would  tolerate hydral/nitrates.  Let's see how he does with po amio first and then consider  blocker for starters.  Possible cath this admission if pt willing and renal fxn stable.  3.  CAD/demand ischemia:  Pt with prior h/o CAD and CABG.  Low risk myoview in 2016.  No recent chest pain.  He did have mild DOE on day of admission.  Troponin peaked @ 0.07.  Currently asymptomatic.  Echo with further reduced EF compared to 09/2016 - now 15-20%.  Cath being considered pending renal fxn and pt wishes.  Cont asa/statin.  4.  Acute on chronic stage 3 kidney dzs:  Creat improved since admission - was 3.37, now 2.29, which is the lowest he's been since 08/2016. Cont to follow.  5.  Hypokalemia:  K 3.4 upon arrival to ED.  Now 3.9.  ? Role in VT.  Signed, Murray Hodgkins, NP  02/15/2017, 11:21 AM

## 2017-02-15 NOTE — Telephone Encounter (Signed)
Prescription refill

## 2017-02-16 LAB — BASIC METABOLIC PANEL
Anion gap: 6 (ref 5–15)
BUN: 27 mg/dL — AB (ref 6–20)
CALCIUM: 8.8 mg/dL — AB (ref 8.9–10.3)
CHLORIDE: 111 mmol/L (ref 101–111)
CO2: 23 mmol/L (ref 22–32)
CREATININE: 2.29 mg/dL — AB (ref 0.61–1.24)
GFR, EST AFRICAN AMERICAN: 34 mL/min — AB (ref >60)
GFR, EST NON AFRICAN AMERICAN: 29 mL/min — AB (ref >60)
Glucose, Bld: 83 mg/dL (ref 65–99)
Potassium: 4 mmol/L (ref 3.5–5.1)
SODIUM: 140 mmol/L (ref 135–145)

## 2017-02-16 LAB — MAGNESIUM: MAGNESIUM: 1.8 mg/dL (ref 1.7–2.4)

## 2017-02-16 LAB — GLUCOSE, CAPILLARY: GLUCOSE-CAPILLARY: 72 mg/dL (ref 65–99)

## 2017-02-16 MED ORDER — MEXILETINE HCL 200 MG PO CAPS: 200.0000 mg | ORAL_CAPSULE | Freq: Two times a day (BID) | ORAL | 1 refills | Status: DC

## 2017-02-16 MED ORDER — AMIODARONE HCL 200 MG PO TABS: ORAL_TABLET | ORAL | 1 refills | Status: DC

## 2017-02-16 MED ORDER — COLCHICINE 0.6 MG PO CAPS: 1.0000 | ORAL_CAPSULE | Freq: Every day | ORAL | 1 refills | Status: DC
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
## 2017-02-16 NOTE — Plan of Care (Signed)
Problem: Safety: Goal: Ability to remain free from injury will improve Outcome: Completed/Met Date Met: 02/16/17 Pt remained injury free while admitted to this unit.
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
## 2017-02-16 NOTE — Progress Notes (Signed)
[place] was admitted to the Hospital on 02/12/2017 and Discharged  02/16/2017 and should be excused from work/school   for 6  days starting 02/12/2017 , may return to work/school without any restrictions.  Call Abel Presto MD, [facility]  [phone] with questions.  Henreitta Leber M.D on 02/16/2017,at 2:18 PM

## 2017-02-16 NOTE — Care Management (Signed)
Patient has active medicaid.  Most likely will discharge on mexiletine for control of ventricular arrhythmia.  The generic is the preferred for medicaid coverage and does not require additional clinical criteria application

## 2017-02-16 NOTE — Progress Notes (Signed)
MD notified via text. Pts CBG is [phone]. I will await any new orders. I will continue to assess.

## 2017-02-17 ENCOUNTER — Telehealth: Payer: Self-pay

## 2017-02-17 ENCOUNTER — Ambulatory Visit (HOSPITAL_COMMUNITY): Admission: RE | Admit: 2017-02-17 | Payer: Medicaid Other | Source: Ambulatory Visit

## 2017-02-17 NOTE — Telephone Encounter (Signed)
Spoke with Mr. Neal he states that he currently see Dr. [name] for his heart care needs. I explained the clinics role in his care and that we would not replace Dr. [name] but rather be a secondary resource. He declined making an appointment stating that he does not need to have a secondary provider to Dr. [name].   Thank you for the referral.

## 2017-02-17 NOTE — Telephone Encounter (Signed)
-----   Message from Alisa Graff, [place] sent at 02/17/2017  8:26 AM EDT ----- Regarding: RE: Heart Failure Appointment Contact: [phone] [place],  Thank you for this information. We will call him to see if we can set up an appointment.  Otila Kluver   ----- Message ----- From: Felipe Drone, RN Sent: 02/16/2017   3:38 PM To: Gaylord Shih, CMA, Alisa Graff, FNP Subject: Heart Failure Appointment                      Hi Neomia Dear,  Wasn't sure about this patient.  Mr. Drumwright was discharged from 2A today with EF of 15 to 20%.  On the AVS I did not see an appointment scheduled for him in the [facility] Clinic nor did I see that you had seen this patient in the past.   He does have an appointment with Dr. [name], Electrophysiologist.    Note:  Patient lives in [place], but has an appointment to see Urologist in [place].
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
## 2017-02-18 ENCOUNTER — Ambulatory Visit: Payer: Medicaid Other | Admitting: Urology

## 2017-02-23 ENCOUNTER — Inpatient Hospital Stay: Payer: Medicaid Other | Admitting: Physician Assistant

## 2017-03-02 LAB — CUP PACEART REMOTE DEVICE CHECK
Battery Remaining Longevity: 102 mo
Brady Statistic RV Percent Paced: 0.09 %
HIGH POWER IMPEDANCE MEASURED VALUE: 86 Ohm
Implantable Lead Implant Date: 20150318
Implantable Lead Model: 181
Implantable Lead Serial Number: 327195
Lead Channel Impedance Value: 608 Ohm
Lead Channel Impedance Value: 608 Ohm
Lead Channel Pacing Threshold Amplitude: 0.75 V
Lead Channel Sensing Intrinsic Amplitude: 11.625 mV
Lead Channel Sensing Intrinsic Amplitude: 11.625 mV
Lead Channel Setting Pacing Pulse Width: 0.4 ms
Lead Channel Setting Sensing Sensitivity: 0.3 mV
MDC IDC LEAD LOCATION: 753860
MDC IDC MSMT BATTERY VOLTAGE: 3.01 V
MDC IDC MSMT LEADCHNL RV PACING THRESHOLD PULSEWIDTH: 0.4 ms
MDC IDC PG IMPLANT DT: 20150318
MDC IDC SESS DTM: 20180703041704
MDC IDC SET LEADCHNL RV PACING AMPLITUDE: 2 V
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
## 2017-03-08 ENCOUNTER — Ambulatory Visit (INDEPENDENT_AMBULATORY_CARE_PROVIDER_SITE_OTHER): Payer: Medicaid Other | Admitting: *Deleted

## 2017-03-08 ENCOUNTER — Ambulatory Visit (INDEPENDENT_AMBULATORY_CARE_PROVIDER_SITE_OTHER): Payer: Medicaid Other | Admitting: Physician Assistant

## 2017-03-08 VITALS — BP 122/84 | HR 70 | Ht 70.0 in | Wt 196.0 lb

## 2017-03-08 DIAGNOSIS — Z95810 Presence of automatic (implantable) cardiac defibrillator: Secondary | ICD-10-CM

## 2017-03-08 DIAGNOSIS — I472 Ventricular tachycardia, unspecified: Secondary | ICD-10-CM

## 2017-03-08 DIAGNOSIS — I255 Ischemic cardiomyopathy: Secondary | ICD-10-CM

## 2017-03-08 DIAGNOSIS — I5022 Chronic systolic (congestive) heart failure: Secondary | ICD-10-CM

## 2017-03-08 DIAGNOSIS — I5042 Chronic combined systolic (congestive) and diastolic (congestive) heart failure: Secondary | ICD-10-CM | POA: Diagnosis not present

## 2017-03-08 DIAGNOSIS — I2510 Atherosclerotic heart disease of native coronary artery without angina pectoris: Secondary | ICD-10-CM

## 2017-03-08 LAB — CUP PACEART INCLINIC DEVICE CHECK
Battery Voltage: 2.93 V
Brady Statistic RV Percent Paced: 0.01 %
HIGH POWER IMPEDANCE MEASURED VALUE: 76 Ohm
Implantable Lead Implant Date: 20150318
Implantable Lead Model: 181
Lead Channel Impedance Value: 551 Ohm
Lead Channel Impedance Value: 551 Ohm
Lead Channel Sensing Intrinsic Amplitude: 10.375 mV
Lead Channel Setting Pacing Amplitude: 2 V
Lead Channel Setting Pacing Pulse Width: 0.4 ms
MDC IDC LEAD LOCATION: 753860
MDC IDC LEAD SERIAL: 327195
MDC IDC MSMT BATTERY REMAINING LONGEVITY: 101 mo
MDC IDC MSMT LEADCHNL RV PACING THRESHOLD AMPLITUDE: 0.75 V
MDC IDC MSMT LEADCHNL RV PACING THRESHOLD PULSEWIDTH: 0.4 ms
MDC IDC MSMT LEADCHNL RV SENSING INTR AMPL: 9 mV
MDC IDC PG IMPLANT DT: 20150318
MDC IDC SESS DTM: 20180806152027
MDC IDC SET LEADCHNL RV SENSING SENSITIVITY: 0.3 mV

## 2017-03-08 MED ORDER — CARVEDILOL 3.125 MG PO TABS: 3.1250 mg | ORAL_TABLET | Freq: Two times a day (BID) | ORAL | 5 refills | Status: DC

## 2017-03-08 NOTE — Progress Notes (Signed)
ICD check in clinic for RU. Normal device function. Threshold and sensing consistent with previous device measurements. Impedance trends stable over time. 1 episode of NSVT, 20+ beats @ 140bpm. Histogram distribution appropriate for patient and level of activity. No changes made this session. Device programmed at appropriate safety margins. Device programmed to optimize intrinsic conduction. Estimated longevity 8.4 years. Pt enrolled in remote follow-up. [facility] 05/04/17, ROV with RU (when SK in office) 04/08/17.

## 2017-03-08 NOTE — Patient Instructions (Addendum)
Medication Instructions:   START TAKING CARVEDILOL 3.125 TWICE A DAY   If you need a refill on your cardiac medications before your next appointment, please call your pharmacy.  Labwork:  NONE ORDERED  TODAY    Testing/Procedures: NONE ORDERED  TODAY    Follow-Up:  IN ONE MONTH WITH KLEIN  OR WITH URSUY ON A DAY KLEIN IN OFFICE   ( IF NOT AVAILABLE MESSAGE SCHEDULER MELISSA  TO FIND SPACE)    Any Other Special Instructions Will Be Listed Below (If Applicable).   NO DRIVING FOR 6 MONTHS

## 2017-03-10 ENCOUNTER — Other Ambulatory Visit: Payer: Self-pay | Admitting: Family Medicine

## 2017-03-10 NOTE — Telephone Encounter (Signed)
Pt contacted office for refill request on the following medications:  HYDROcodone-acetaminophen (NORCO) 10-325 MG tablet  [phone]

## 2017-03-11 MED ORDER — HYDROCODONE-ACETAMINOPHEN 10-325 MG PO TABS: 1.0000 | ORAL_TABLET | Freq: Three times a day (TID) | ORAL | 0 refills | Status: DC | PRN

## 2017-03-12 NOTE — Progress Notes (Signed)
Patient came in today for a blood pressure check. He reports having some shortness of breath with exertion while coming up the steps. Patient was asymptomatic while resting. Patient had an episode a few weeks ago with similar symptoms and was rushed to the hospital. Patient was reassured that current BP and pulse was WNL. Advised if his symptoms worsen to call the office or go to the ER.

## 2017-03-16 ENCOUNTER — Ambulatory Visit (HOSPITAL_COMMUNITY)
Admission: RE | Admit: 2017-03-16 | Discharge: 2017-03-16 | Disposition: A | Discharge status: Home / Self Care | Payer: Medicaid Other | Source: Ambulatory Visit | Attending: Urology | Admitting: Urology

## 2017-03-16 DIAGNOSIS — N261 Atrophy of kidney (terminal): Secondary | ICD-10-CM | POA: Diagnosis not present

## 2017-03-16 DIAGNOSIS — N281 Cyst of kidney, acquired: Secondary | ICD-10-CM | POA: Insufficient documentation

## 2017-03-16 DIAGNOSIS — R102 Pelvic and perineal pain: Secondary | ICD-10-CM | POA: Diagnosis present

## 2017-03-16 DIAGNOSIS — I700 Atherosclerosis of aorta: Secondary | ICD-10-CM | POA: Insufficient documentation

## 2017-03-16 DIAGNOSIS — K439 Ventral hernia without obstruction or gangrene: Secondary | ICD-10-CM | POA: Insufficient documentation
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
## 2017-03-23 ENCOUNTER — Ambulatory Visit (INDEPENDENT_AMBULATORY_CARE_PROVIDER_SITE_OTHER): Payer: Medicaid Other | Admitting: Urology

## 2017-03-23 ENCOUNTER — Encounter: Payer: Self-pay | Admitting: Urology

## 2017-03-23 ENCOUNTER — Ambulatory Visit
Admission: RE | Admit: 2017-03-23 | Discharge: 2017-03-23 | Disposition: A | Discharge status: Home / Self Care | Payer: Medicaid Other | Source: Ambulatory Visit | Attending: Urology | Admitting: Urology

## 2017-03-23 VITALS — BP 133/86 | HR 91 | Ht 70.0 in | Wt 193.1 lb

## 2017-03-23 DIAGNOSIS — K5900 Constipation, unspecified: Secondary | ICD-10-CM | POA: Diagnosis not present

## 2017-03-23 DIAGNOSIS — N390 Urinary tract infection, site not specified: Secondary | ICD-10-CM

## 2017-03-23 DIAGNOSIS — R102 Pelvic and perineal pain: Secondary | ICD-10-CM | POA: Diagnosis present

## 2017-03-23 DIAGNOSIS — K5909 Other constipation: Secondary | ICD-10-CM

## 2017-03-23 LAB — BLADDER SCAN AMB NON-IMAGING: Scan Result: 50

## 2017-03-26 ENCOUNTER — Other Ambulatory Visit: Payer: Self-pay

## 2017-03-26 MED ORDER — HYDROCODONE-ACETAMINOPHEN 10-325 MG PO TABS: 1.0000 | ORAL_TABLET | Freq: Three times a day (TID) | ORAL | 0 refills | Status: DC | PRN

## 2017-03-26 NOTE — Telephone Encounter (Signed)
Patient called requesting refill on Hydrocodone. Last fill was 03/11/17, patient states he has had to take 4 tablets daily instead of 3 tablets due to having pinched nerve in his back. He has enough till Monday next week. Please review-aa

## 2017-04-06 ENCOUNTER — Telehealth: Payer: Self-pay | Admitting: Internal Medicine

## 2017-04-06 NOTE — Telephone Encounter (Signed)
New message     Pt would like to speak with Dr Lovena Le about severe memory loss, it took him over 25 minutes for him to figure out how to get to work

## 2017-04-07 ENCOUNTER — Ambulatory Visit (INDEPENDENT_AMBULATORY_CARE_PROVIDER_SITE_OTHER): Payer: Medicaid Other | Admitting: Family Medicine

## 2017-04-07 ENCOUNTER — Other Ambulatory Visit: Payer: Self-pay | Admitting: Family Medicine

## 2017-04-07 ENCOUNTER — Encounter: Payer: Self-pay | Admitting: Family Medicine

## 2017-04-07 VITALS — BP 100/78 | HR 68 | Temp 97.7°F | Resp 16 | Wt 197.0 lb

## 2017-04-07 DIAGNOSIS — I2510 Atherosclerotic heart disease of native coronary artery without angina pectoris: Secondary | ICD-10-CM | POA: Diagnosis not present

## 2017-04-07 DIAGNOSIS — N183 Chronic kidney disease, stage 3 unspecified: Secondary | ICD-10-CM

## 2017-04-07 DIAGNOSIS — R413 Other amnesia: Secondary | ICD-10-CM | POA: Diagnosis not present

## 2017-04-07 DIAGNOSIS — R404 Transient alteration of awareness: Secondary | ICD-10-CM | POA: Diagnosis not present

## 2017-04-07 DIAGNOSIS — G629 Polyneuropathy, unspecified: Secondary | ICD-10-CM | POA: Diagnosis not present

## 2017-04-07 DIAGNOSIS — R4182 Altered mental status, unspecified: Secondary | ICD-10-CM

## 2017-04-07 MED ORDER — CLONAZEPAM 2 MG PO TABS: 1.0000 mg | ORAL_TABLET | ORAL | 1 refills | Status: DC | PRN

## 2017-04-07 NOTE — Telephone Encounter (Signed)
Pt called back saying the rx was sent [facility] in [place] but the rx should say new refill, early refill and they have to talk to the nurse before they can fill it.    Thanks ter

## 2017-04-07 NOTE — Telephone Encounter (Signed)
Patient scheduled an appointment today at 10:30am for memory loss.

## 2017-04-07 NOTE — Telephone Encounter (Signed)
Left message on patient cell phone per DPR.  Notified patient that per Dr. Lovena Le he should notify his PCP of new memory issues.  Left this nurse name and # for call back if any further questions.
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
## 2017-04-07 NOTE — Telephone Encounter (Signed)
1. Pt stated that he needs [place] contact [facility] to tell them it's ok to fill his clonazePAM (KLONOPIN) 2 MG tablet early because he knocked over his bottle and lost several pills.   2. Pt stated that on Saturday 04/03/17 he was getting ready to leave for work but he couldn't remember how to get to work. Pt stated that he could picture where he worked but it took 20 30 minutes for him to remember how to get to work. Pt wanted to know if he should come see Dr. Caryn Section or what he should do.  Please advise. Thanks TNP

## 2017-04-07 NOTE — Progress Notes (Signed)
Patient: Richard Cook Male    DOB: 03-30-1956   61 y.o.   MRN: [id number] Visit Date: 04/07/2017  Today's Provider: Lelon Huh, MD   Chief Complaint  Patient presents with  . Memory Loss   Subjective:    HPI Memory problems:  Patient comes in today reporting that 4 days ago he had trouble remembering how to get to work. He states today while driving to his appointment, he had difficulty keeping his car within the lanes. He also states has a constant pain in his ankles and has been taking a few additional doses of hydrocodone overnight. He continues on clonazepam 2mg  every eight hours for anxiety. No other new medications, no OTC medications. He did have ct head in 2015 showing indications mild chronic ischemic white matter disease.  He also reports his feet have been burning off and on for the last week.     Allergies  Allergen Reactions  . Eggs Or Egg-Derived Products Hives  . Codeine Other (See Comments)    Tolerates hydrocodone     Current Outpatient Prescriptions:  .  amiodarone (PACERONE) 200 MG tablet, Take 200 mg by mouth daily., Disp: , Rfl:  .  aspirin EC 81 MG tablet, Take 81 mg by mouth daily., Disp: , Rfl:  .  carvedilol (COREG) 3.125 MG tablet, Take 1 tablet (3.125 mg total) by mouth 2 (two) times daily., Disp: 60 tablet, Rfl: 5 .  Cholecalciferol (EQL VITAMIN D3) 2000 units CAPS, Take 2,000 Units by mouth daily., Disp: , Rfl:  .  clonazePAM (KLONOPIN) 2 MG tablet, Take 1 tablet (2 mg total) by mouth 3 (three) times daily as needed. for anxiety, Disp: 90 tablet, Rfl: 3 .  Colchicine (MITIGARE) 0.6 MG CAPS, Take 1 tablet by mouth daily. (Patient taking differently: Take 1 tablet by mouth daily as needed. ), Disp: 30 capsule, Rfl: 1 .  cyclobenzaprine (FLEXERIL) 5 MG tablet, One every four hours as needed, Disp: 60 tablet, Rfl: 3 .  furosemide (LASIX) 40 MG tablet, Take 1 tablet (40 mg total) by mouth daily. Or as directed by physician, Disp: 30 tablet,  Rfl: 3 .  HYDROcodone-acetaminophen (NORCO) 10-325 MG tablet, Take 1 tablet by mouth every 8 (eight) hours as needed., Disp: 60 tablet, Rfl: 0 .  Omega-3 Fatty Acids (FISH OIL PO), Take 1 capsule by mouth daily., Disp: , Rfl:   Review of Systems  Constitutional: Positive for fatigue. Negative for appetite change, chills and fever.  Respiratory: Negative for chest tightness, shortness of breath and wheezing.   Cardiovascular: Negative for chest pain and palpitations.  Gastrointestinal: Negative for abdominal pain, nausea and vomiting.  Musculoskeletal: Positive for arthralgias (in ankles).  Psychiatric/Behavioral: Positive for confusion and decreased concentration.    Social History  Substance Use Topics  . Smoking status: Never Smoker  . Smokeless tobacco: Never Used  . Alcohol use No     Comment: 10/07/2016 "quit in the early 1990s"   Objective:   BP 100/78 (BP Location: Right Arm, Patient Position: Sitting, Cuff Size: Large)   Pulse 68   Temp 97.7 F (36.5 C) (Oral)   Resp 16   Wt 197 lb (89.4 kg)   SpO2 95% Comment: room air  BMI 28.27 kg/m  Vitals:   04/07/17 1031  BP: 100/78  Pulse: 68  Resp: 16  Temp: 97.7 F (36.5 C)  TempSrc: Oral  SpO2: 95%  Weight: 197 lb (89.4 kg)   MMSE - Mini Mental  State Exam 04/07/2017  Orientation to time 3  Orientation to Place 5  Registration 3  Attention/ Calculation 5  Recall 2  Language- name 2 objects 2  Language- repeat 1  Language- follow 3 step command 3  Language- read & follow direction 0  Write a sentence 1  Copy design 0  Total score 25    Physical Exam   General Appearance:    Alert, cooperative, no distress  Eyes:    PERRL, conjunctiva/corneas clear, EOM's intact       Lungs:     Clear to auscultation bilaterally, respirations unlabored  Heart:    Regular rate and rhythm, no carotid bruits.   Neurologic:   Awake, alert, oriented x 3. No apparent focal neurological           defect.   Ext:    Mild pain with ROM  of both ankles. Mild edema, no erythema.        Assessment & Plan:     1. Memory changes Counseled regarding amnestic affect of benzodiazepines, and to reduce clonazepam to 1/2 tablet doses. Limit hydrocodone/apap as much as possible.  - CT HEAD W & WO CONTRAST; Future - Comprehensive metabolic panel - CBC - Ammonia  2. Altered mental status, unspecified altered mental status type  - Comprehensive metabolic panel - CBC - Ammonia  3. Neuropathy - Vitamin B12  4. Chronic kidney disease (CKD), stage III (moderate)  - VITAMIN D 25 Hydroxy (Vit-D Deficiency, Fractures)   5. CAD- PCI to RCA 08/10/11, CABG in [place] 5/14 Previously on rosuvastatin but no longer taking.  - Lipid panel  6. Transient alteration of awareness  - CT HEAD W & WO CONTRAST; Future       Lelon Huh, MD  [facility]

## 2017-04-07 NOTE — Telephone Encounter (Signed)
Contacted [facility] pharmacist to advise them that Dr. Caryn Section authorized refill and decreased the total mg per day. Left message advising patient.

## 2017-04-07 NOTE — Telephone Encounter (Signed)
Follow up     Pt is calling back about this. Please call pt.

## 2017-04-08 ENCOUNTER — Encounter: Payer: Medicaid Other | Admitting: Physician Assistant

## 2017-04-08 ENCOUNTER — Other Ambulatory Visit: Payer: Self-pay | Admitting: Family Medicine

## 2017-04-08 DIAGNOSIS — N184 Chronic kidney disease, stage 4 (severe): Secondary | ICD-10-CM

## 2017-04-12 ENCOUNTER — Other Ambulatory Visit: Payer: Self-pay | Admitting: Family Medicine

## 2017-04-12 NOTE — Telephone Encounter (Signed)
Pt needs refill on his hydrocodone 10-325  Thanks teri

## 2017-04-13 ENCOUNTER — Telehealth: Payer: Self-pay | Admitting: *Deleted

## 2017-04-13 MED ORDER — HYDROCODONE-ACETAMINOPHEN 10-325 MG PO TABS: 1.0000 | ORAL_TABLET | Freq: Three times a day (TID) | ORAL | 0 refills | Status: DC | PRN

## 2017-04-13 MED ORDER — ROSUVASTATIN CALCIUM 40 MG PO TABS: 40.0000 mg | ORAL_TABLET | Freq: Every day | ORAL | 11 refills | Status: DC

## 2017-04-13 NOTE — Telephone Encounter (Signed)
Patient was notified of results. Expressed understanding. Rx sent to pharmacy. 

## 2017-04-13 NOTE — Telephone Encounter (Signed)
-----   Message from Birdie Sons, MD sent at 04/08/2017  7:58 AM EDT ----- Kidney functions have gotten much worse. Need referral to nephrology. Have sent order to [facility].  Cholesterol is much too high, need to start rosuvastatin 40mg  once a day, #30, rf x 11.

## 2017-04-14 ENCOUNTER — Ambulatory Visit
Admission: RE | Admit: 2017-04-14 | Discharge: 2017-04-14 | Disposition: A | Discharge status: Home / Self Care | Payer: Medicaid Other | Source: Ambulatory Visit | Attending: Family Medicine | Admitting: Family Medicine

## 2017-04-14 ENCOUNTER — Encounter: Payer: Self-pay | Admitting: Family Medicine

## 2017-04-14 ENCOUNTER — Ambulatory Visit (INDEPENDENT_AMBULATORY_CARE_PROVIDER_SITE_OTHER): Payer: Medicaid Other | Admitting: Family Medicine

## 2017-04-14 ENCOUNTER — Telehealth: Payer: Self-pay | Admitting: Family Medicine

## 2017-04-14 VITALS — BP 110/80 | HR 67 | Temp 99.2°F | Resp 16 | Wt 196.0 lb

## 2017-04-14 DIAGNOSIS — R0781 Pleurodynia: Secondary | ICD-10-CM

## 2017-04-14 DIAGNOSIS — R7989 Other specified abnormal findings of blood chemistry: Secondary | ICD-10-CM | POA: Insufficient documentation

## 2017-04-14 DIAGNOSIS — R413 Other amnesia: Secondary | ICD-10-CM | POA: Diagnosis not present

## 2017-04-14 MED ORDER — LACTULOSE 10 GM/15ML PO SOLN: 30.0000 g | Freq: Three times a day (TID) | ORAL | 0 refills | Status: DC

## 2017-04-14 NOTE — Telephone Encounter (Signed)
Okay given  °

## 2017-04-14 NOTE — Patient Instructions (Signed)
Go to the [facility] on [facility] for posterior rib Xray

## 2017-04-14 NOTE — Telephone Encounter (Signed)
OK to refill now

## 2017-04-14 NOTE — Telephone Encounter (Signed)
Pt took his Rx to the pharmacy for his hydrocodone and they told him we would have to call and give o for an early refill  [facility] [address]  [place]  Thanks teri

## 2017-04-14 NOTE — Telephone Encounter (Signed)
Richard Cook with [facility] states he has rec'd a Rx for HYDROcodone-acetaminophen (NORCO) 10-325 MG tablet Richard Cook states he will need approval to refill this early.  KP#[phone]

## 2017-04-14 NOTE — Telephone Encounter (Signed)
Pt is returning call.  QJ#[phone]

## 2017-04-14 NOTE — Progress Notes (Signed)
Patient: Richard Cook Male    DOB: July 09, 1956   61 y.o.   MRN: [id number] Visit Date: 04/14/2017  Today's Provider: Lelon Huh, MD   Chief Complaint  Patient presents with  . Fall  . Back Pain   Subjective:    Fall  Incident onset: 1 week ago. The fall occurred while walking (lost his balance). Impact surface: side of the bed frame. There was no blood loss. Point of impact: lower right side of his back. The symptoms are aggravated by standing (also when rolling over in bed). Pertinent negatives include no abdominal pain, fever, nausea or vomiting. Treatments tried: Hydrocodone. The treatment provided no relief.  Back Pain  Pertinent negatives include no abdominal pain, chest pain or fever.   Currently working in Audiological scientist at [employer]. He has had trouble getting to work and employer request a statement that he is still able to work. He was seen last week for some problems with memory. Labs were ordered which have not been reviewed at the time of today's visit.     Allergies  Allergen Reactions  . Eggs Or Egg-Derived Products Hives  . Codeine Other (See Comments)    Tolerates hydrocodone     Current Outpatient Prescriptions:  .  amiodarone (PACERONE) 200 MG tablet, Take 200 mg by mouth daily., Disp: , Rfl:  .  aspirin EC 81 MG tablet, Take 81 mg by mouth daily., Disp: , Rfl:  .  carvedilol (COREG) 3.125 MG tablet, Take 1 tablet (3.125 mg total) by mouth 2 (two) times daily., Disp: 60 tablet, Rfl: 5 .  Cholecalciferol (EQL VITAMIN D3) 2000 units CAPS, Take 2,000 Units by mouth daily., Disp: , Rfl:  .  clonazePAM (KLONOPIN) 2 MG tablet, Take 0.5 tablets (1 mg total) by mouth every 4 (four) hours as needed. for anxiety, Disp: 30 tablet, Rfl: 1 .  Colchicine (MITIGARE) 0.6 MG CAPS, Take 1 tablet by mouth daily. (Patient taking differently: Take 1 tablet by mouth daily as needed. ), Disp: 30 capsule, Rfl: 1 .  cyclobenzaprine (FLEXERIL) 5 MG tablet, One every four  hours as needed, Disp: 60 tablet, Rfl: 3 .  furosemide (LASIX) 40 MG tablet, Take 1 tablet (40 mg total) by mouth daily. Or as directed by physician, Disp: 30 tablet, Rfl: 3 .  HYDROcodone-acetaminophen (NORCO) 10-325 MG tablet, Take 1 tablet by mouth every 8 (eight) hours as needed., Disp: 60 tablet, Rfl: 0 .  Omega-3 Fatty Acids (FISH OIL PO), Take 1 capsule by mouth daily., Disp: , Rfl:  .  rosuvastatin (CRESTOR) 40 MG tablet, Take 1 tablet (40 mg total) by mouth daily., Disp: 30 tablet, Rfl: 11  Review of Systems  Constitutional: Negative for appetite change, chills and fever.  Respiratory: Negative for chest tightness, shortness of breath and wheezing.   Cardiovascular: Negative for chest pain and palpitations.  Gastrointestinal: Negative for abdominal pain, nausea and vomiting.  Musculoskeletal: Positive for back pain.    Social History  Substance Use Topics  . Smoking status: Never Smoker  . Smokeless tobacco: Never Used  . Alcohol use No     Comment: 10/07/2016 "quit in the early 1990s"   Objective:   BP 110/80 (BP Location: Left Arm, Patient Position: Sitting, Cuff Size: Large)   Pulse 67   Temp 99.2 F (37.3 C) (Oral)   Resp 16   Wt 196 lb (88.9 kg)   SpO2 94% Comment: room air  BMI 28.12 kg/m  Physical Exam  General appearance: alert, well developed, well nourished, cooperative and in no distress. A & O x 3 Head: Normocephalic, without obvious abnormality, atraumatic Respiratory: Respirations even and unlabored, normal respiratory rate MS: Moderate tenderness right posterior ribs. No swelling, no erythema.      Assessment & Plan:     1. Rib pain on right side  - DG Ribs Unilateral Right; Future  2. Memory changes Note printed for patient's employer that he is currently fit to work. Labs ordered at last weeks visit have yet to be reviewed at the time of this visit.        Lelon Huh, MD  [facility]

## 2017-04-14 NOTE — Telephone Encounter (Signed)
Please advise 

## 2017-04-15 ENCOUNTER — Telehealth: Payer: Self-pay | Admitting: Family Medicine

## 2017-04-15 DIAGNOSIS — R7989 Other specified abnormal findings of blood chemistry: Secondary | ICD-10-CM

## 2017-04-15 DIAGNOSIS — N185 Chronic kidney disease, stage 5: Secondary | ICD-10-CM

## 2017-04-15 NOTE — Telephone Encounter (Signed)
-----   Message from Birdie Sons, MD sent at 04/14/2017  1:20 PM EDT ----- Kidney functions have gotten worse, and has high levels or ammonia.     1. He needs to start lactulose 73ml three times daily, have sent prescription to his pharmacy. (if he develops diarrhea he can cut back to one or two times a day)    2. Need to avoid all protein in diet since protein is the source of ammonia.     3. Drink more water to help kidney functions.     4. Reduce furosemide to 1/2 tablet daily    5. Need O.V. And check renal panel and ammonia levels on Monday Still waiting for xray results from today Also, please see if [facility] can ADD creatinine Kinase to these labs.

## 2017-04-15 NOTE — Telephone Encounter (Signed)
Patient was notified of results. Expressed understanding. Patient declined ov and stated he may be able to come some time next week to have labs done. Lab slip printed and left at front desk for pick-up.
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
## 2017-04-19 ENCOUNTER — Ambulatory Visit: Payer: Medicaid Other

## 2017-04-19 ENCOUNTER — Ambulatory Visit: Payer: Medicaid Other | Admitting: Urology

## 2017-04-19 ENCOUNTER — Telehealth: Payer: Self-pay | Admitting: Family Medicine

## 2017-04-19 NOTE — Telephone Encounter (Signed)
Please advise 

## 2017-04-19 NOTE — Telephone Encounter (Signed)
Pt is having really bad gout pain in his hand.  He said the medication Colchicine is not helping.  He wants to know if you can give him something else.  He uses [facility] in [place]  His call back [phone]  [name]

## 2017-04-20 ENCOUNTER — Telehealth: Payer: Self-pay

## 2017-04-20 ENCOUNTER — Ambulatory Visit: Payer: Medicaid Other

## 2017-04-20 NOTE — Telephone Encounter (Signed)
Stephanie with [facility] CT called stating patient is scheduled to have a CT of the head with and without contrast. His kidney functions was 3.47 and they cant inject in anyone above 2. Colletta Maryland wants to know if a new order can be put in for CT of head without contrast, or an order for a different exam that doesn't need dye. She says we would need to verify with insurance again for the new order. Questions call Colletta Maryland at [phone].

## 2017-04-20 NOTE — Telephone Encounter (Signed)
Can cancel all imaging studies for now. Sx be all be due to elevated ammonia level.

## 2017-04-20 NOTE — Telephone Encounter (Signed)
Spoke with patient. He states that symptoms are better today. He can not come in today due to feeling sick-has been throwing up and feeling nauseas every time he takes Colchicine, I advised patient to stop taking this medication then. He has other doctor appointments the next 2 days and can not come in then. Patient states he will just wait and see how he does in the next day or two with his discomfort-Anastasiya V Hopkins, RMA

## 2017-04-20 NOTE — Telephone Encounter (Signed)
He needs someone to take at look at. He has had a lot of infection in extremities and needs to make sure there is no infection. Can see a PA this afternoon.

## 2017-04-21 ENCOUNTER — Encounter: Payer: Medicaid Other | Admitting: Physician Assistant

## 2017-04-21 ENCOUNTER — Ambulatory Visit: Admission: RE | Admit: 2017-04-21 | Payer: Medicaid Other | Source: Ambulatory Visit

## 2017-04-21 NOTE — Telephone Encounter (Signed)
CT scan cancelled and patient advised. sd

## 2017-04-22 ENCOUNTER — Encounter: Payer: Self-pay | Admitting: Physician Assistant

## 2017-04-22 ENCOUNTER — Other Ambulatory Visit: Payer: Self-pay | Admitting: Family Medicine

## 2017-04-22 NOTE — Telephone Encounter (Signed)
Patient was supposed to have come back to check ammonia level because it was high when checked on the 5th. He needs to have this done before we can approve any prescription refills.

## 2017-04-22 NOTE — Telephone Encounter (Signed)
Patient advised. He verbalized understanding.  

## 2017-04-22 NOTE — Telephone Encounter (Signed)
Pt called saying he needs a refill on the Klonopin 2mg .  He said the pharmacy would only give him 15 pills  He uses [facility] in [place]  Thanks teri

## 2017-04-30 ENCOUNTER — Encounter (HOSPITAL_COMMUNITY): Payer: Self-pay

## 2017-04-30 ENCOUNTER — Emergency Department (HOSPITAL_COMMUNITY)
Admission: EM | Admit: 2017-04-30 | Discharge: 2017-04-30 | Disposition: A | Discharge status: Home / Self Care | Payer: Medicaid Other | Attending: Emergency Medicine | Admitting: Emergency Medicine

## 2017-04-30 ENCOUNTER — Emergency Department (HOSPITAL_COMMUNITY): Payer: Medicaid Other

## 2017-04-30 DIAGNOSIS — S0990XA Unspecified injury of head, initial encounter: Secondary | ICD-10-CM

## 2017-04-30 DIAGNOSIS — Z7982 Long term (current) use of aspirin: Secondary | ICD-10-CM | POA: Diagnosis not present

## 2017-04-30 DIAGNOSIS — I252 Old myocardial infarction: Secondary | ICD-10-CM | POA: Insufficient documentation

## 2017-04-30 DIAGNOSIS — Y929 Unspecified place or not applicable: Secondary | ICD-10-CM | POA: Diagnosis not present

## 2017-04-30 DIAGNOSIS — I132 Hypertensive heart and chronic kidney disease with heart failure and with stage 5 chronic kidney disease, or end stage renal disease: Secondary | ICD-10-CM | POA: Insufficient documentation

## 2017-04-30 DIAGNOSIS — W19XXXA Unspecified fall, initial encounter: Secondary | ICD-10-CM

## 2017-04-30 DIAGNOSIS — Y999 Unspecified external cause status: Secondary | ICD-10-CM | POA: Insufficient documentation

## 2017-04-30 DIAGNOSIS — N185 Chronic kidney disease, stage 5: Secondary | ICD-10-CM | POA: Diagnosis not present

## 2017-04-30 DIAGNOSIS — W010XXA Fall on same level from slipping, tripping and stumbling without subsequent striking against object, initial encounter: Secondary | ICD-10-CM | POA: Diagnosis not present

## 2017-04-30 DIAGNOSIS — I5042 Chronic combined systolic (congestive) and diastolic (congestive) heart failure: Secondary | ICD-10-CM | POA: Insufficient documentation

## 2017-04-30 DIAGNOSIS — Y939 Activity, unspecified: Secondary | ICD-10-CM | POA: Insufficient documentation

## 2017-04-30 DIAGNOSIS — G8929 Other chronic pain: Secondary | ICD-10-CM | POA: Diagnosis not present

## 2017-04-30 DIAGNOSIS — Z79899 Other long term (current) drug therapy: Secondary | ICD-10-CM | POA: Insufficient documentation

## 2017-04-30 DIAGNOSIS — I2510 Atherosclerotic heart disease of native coronary artery without angina pectoris: Secondary | ICD-10-CM | POA: Diagnosis not present

## 2017-04-30 NOTE — ED Notes (Addendum)
RN spoke to pt about note pt states he wrote it incase he died from a brain problem. Pt states he was supposed to have a CT scan and the doctor cancled it and he thinks something is wrong because he has been falling over 6 months and slurred speech. Pt states his PCP is aware of these problems. Pt has no plan of harming self and just wants help.

## 2017-04-30 NOTE — ED Triage Notes (Signed)
Pt from hotel where he is staying by [facility] for fall pt states his neck hurts some. EMS found a suicide note at pt residents. Pt reports to writing the note, but has no current plans
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
## 2017-05-02 ENCOUNTER — Emergency Department (HOSPITAL_COMMUNITY)
Admission: EM | Admit: 2017-05-02 | Discharge: 2017-05-03 | Disposition: A | Discharge status: Home / Self Care | Payer: Medicaid Other | Attending: Emergency Medicine | Admitting: Emergency Medicine

## 2017-05-02 ENCOUNTER — Emergency Department (HOSPITAL_COMMUNITY): Payer: Medicaid Other

## 2017-05-02 ENCOUNTER — Encounter (HOSPITAL_COMMUNITY): Payer: Self-pay | Admitting: *Deleted

## 2017-05-02 ENCOUNTER — Encounter: Payer: Self-pay | Admitting: Cardiology

## 2017-05-02 ENCOUNTER — Telehealth: Payer: Self-pay | Admitting: Cardiology

## 2017-05-02 DIAGNOSIS — Z4502 Encounter for adjustment and management of automatic implantable cardiac defibrillator: Secondary | ICD-10-CM | POA: Insufficient documentation

## 2017-05-02 DIAGNOSIS — I252 Old myocardial infarction: Secondary | ICD-10-CM | POA: Diagnosis not present

## 2017-05-02 DIAGNOSIS — I2510 Atherosclerotic heart disease of native coronary artery without angina pectoris: Secondary | ICD-10-CM | POA: Diagnosis not present

## 2017-05-02 DIAGNOSIS — I5042 Chronic combined systolic (congestive) and diastolic (congestive) heart failure: Secondary | ICD-10-CM | POA: Insufficient documentation

## 2017-05-02 DIAGNOSIS — T829XXA Unspecified complication of cardiac and vascular prosthetic device, implant and graft, initial encounter: Secondary | ICD-10-CM

## 2017-05-02 DIAGNOSIS — R0609 Other forms of dyspnea: Secondary | ICD-10-CM | POA: Insufficient documentation

## 2017-05-02 DIAGNOSIS — N185 Chronic kidney disease, stage 5: Secondary | ICD-10-CM | POA: Insufficient documentation

## 2017-05-02 DIAGNOSIS — Z7982 Long term (current) use of aspirin: Secondary | ICD-10-CM | POA: Insufficient documentation

## 2017-05-02 DIAGNOSIS — Z79899 Other long term (current) drug therapy: Secondary | ICD-10-CM | POA: Diagnosis not present

## 2017-05-02 DIAGNOSIS — R0600 Dyspnea, unspecified: Secondary | ICD-10-CM

## 2017-05-02 DIAGNOSIS — I132 Hypertensive heart and chronic kidney disease with heart failure and with stage 5 chronic kidney disease, or end stage renal disease: Secondary | ICD-10-CM | POA: Diagnosis not present

## 2017-05-02 DIAGNOSIS — Z95810 Presence of automatic (implantable) cardiac defibrillator: Secondary | ICD-10-CM

## 2017-05-02 DIAGNOSIS — M5489 Other dorsalgia: Secondary | ICD-10-CM

## 2017-05-02 LAB — CBC WITH DIFFERENTIAL/PLATELET
Basophils Absolute: 0 10*3/uL (ref 0.0–0.1)
Basophils Relative: 0 %
EOS PCT: 2 %
Eosinophils Absolute: 0.1 10*3/uL (ref 0.0–0.7)
HCT: 48.6 % (ref 39.0–52.0)
Hemoglobin: 16.3 g/dL (ref 13.0–17.0)
LYMPHS ABS: 0.9 10*3/uL (ref 0.7–4.0)
LYMPHS PCT: 14 %
MCH: 31.2 pg (ref 26.0–34.0)
MCHC: 33.5 g/dL (ref 30.0–36.0)
MCV: 93.1 fL (ref 78.0–100.0)
MONO ABS: 0.6 10*3/uL (ref 0.1–1.0)
Monocytes Relative: 10 %
Neutro Abs: 4.8 10*3/uL (ref 1.7–7.7)
Neutrophils Relative %: 74 %
PLATELETS: 126 10*3/uL — AB (ref 150–400)
RBC: 5.22 MIL/uL (ref 4.22–5.81)
RDW: 13.5 % (ref 11.5–15.5)
WBC: 6.5 10*3/uL (ref 4.0–10.5)

## 2017-05-02 LAB — COMPREHENSIVE METABOLIC PANEL
ALT: 32 U/L (ref 17–63)
AST: 27 U/L (ref 15–41)
Albumin: 3.9 g/dL (ref 3.5–5.0)
Alkaline Phosphatase: 120 U/L (ref 38–126)
Anion gap: 11 (ref 5–15)
BILIRUBIN TOTAL: 1.1 mg/dL (ref 0.3–1.2)
BUN: 38 mg/dL — AB (ref 6–20)
CO2: 20 mmol/L — ABNORMAL LOW (ref 22–32)
CREATININE: 3.45 mg/dL — AB (ref 0.61–1.24)
Calcium: 9.3 mg/dL (ref 8.9–10.3)
Chloride: 102 mmol/L (ref 101–111)
GFR calc Af Amer: 21 mL/min — ABNORMAL LOW (ref >60)
GFR, EST NON AFRICAN AMERICAN: 18 mL/min — AB (ref >60)
Glucose, Bld: 96 mg/dL (ref 65–99)
POTASSIUM: 3.8 mmol/L (ref 3.5–5.1)
Sodium: 133 mmol/L — ABNORMAL LOW (ref 135–145)
TOTAL PROTEIN: 7.3 g/dL (ref 6.5–8.1)

## 2017-05-02 LAB — BRAIN NATRIURETIC PEPTIDE: B Natriuretic Peptide: 78.3 pg/mL (ref 0.0–100.0)

## 2017-05-02 LAB — I-STAT TROPONIN, ED: TROPONIN I, POC: 0.02 ng/mL (ref 0.00–0.08)
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
## 2017-05-02 NOTE — Telephone Encounter (Signed)
This encounter was created in error - please disregard.

## 2017-05-02 NOTE — ED Triage Notes (Signed)
Pt reports that on Friday he started hearing a beeping sound from ICD that lasts about 45 seconds. Pt has been shocked two previous times from ICD, denies shocks this time, but hears the beeping intermittently. C/o nausea, denies chest pain or sob. Pt recently seen here two days ago for a fall and is currently having back pain from fall. Hx of Tanna Furry and has an appt on 10/2 with Dr.Klein, cardiology

## 2017-05-02 NOTE — Telephone Encounter (Signed)
Patient called stating that his ICD had an alarm that was going off.  Apparently had a fall on Friday (no syncope) and lost his balance falling back on his back and his head.  Went to ER and evaluated and sent home.  Yesterday and today he heard an alarm from his ICD and now is calling to see what to do.  Discussed with Dr. Curt Bears who instructed patient to call the office first thing in the am and talk with device clinic about the alarm.  Patient voiced understanding and number at office given to him to call.

## 2017-05-02 NOTE — ED Notes (Signed)
Delay in lab draw,  Per phleb Mendel Ryder pt not in room.

## 2017-05-03 ENCOUNTER — Other Ambulatory Visit: Payer: Self-pay | Admitting: Family Medicine

## 2017-05-03 MED ORDER — ACETAMINOPHEN 500 MG PO TABS: 1000.0000 mg | ORAL_TABLET | Freq: Three times a day (TID) | ORAL | 0 refills | Status: AC

## 2017-05-03 MED ORDER — CYCLOBENZAPRINE HCL 5 MG PO TABS: 5.0000 mg | ORAL_TABLET | Freq: Three times a day (TID) | ORAL | 0 refills | Status: AC | PRN

## 2017-05-03 MED ORDER — ACETAMINOPHEN 500 MG PO TABS
1000.0000 mg | ORAL_TABLET | Freq: Once | ORAL | Status: AC
  Administered 2017-05-03: 1000 mg via ORAL
  Filled 2017-05-03: qty 2

## 2017-05-03 NOTE — Telephone Encounter (Signed)
[facility] faxed a refill request on the following medications:  clonazePAM (KLONOPIN) 2 MG tablet.  Take 1/2 tablet by mouth every 4 hours as needed for anxiety.    [facility]  [place]

## 2017-05-03 NOTE — ED Notes (Signed)
Pt stable, ambulatory, states understanding of discharge instructions 

## 2017-05-04 ENCOUNTER — Telehealth: Payer: Self-pay | Admitting: Cardiology

## 2017-05-04 ENCOUNTER — Ambulatory Visit (INDEPENDENT_AMBULATORY_CARE_PROVIDER_SITE_OTHER): Payer: Medicaid Other | Admitting: *Deleted

## 2017-05-04 DIAGNOSIS — I472 Ventricular tachycardia, unspecified: Secondary | ICD-10-CM

## 2017-05-04 NOTE — Telephone Encounter (Signed)
Attempted to confirm remote transmission with pt. No answer and was unable to leave a message.   

## 2017-05-05 ENCOUNTER — Encounter: Payer: Self-pay | Admitting: Cardiology

## 2017-05-05 ENCOUNTER — Other Ambulatory Visit: Payer: Self-pay | Admitting: Family Medicine

## 2017-05-05 ENCOUNTER — Telehealth: Payer: Self-pay

## 2017-05-05 ENCOUNTER — Telehealth: Payer: Self-pay | Admitting: *Deleted

## 2017-05-05 NOTE — Telephone Encounter (Signed)
error 

## 2017-05-05 NOTE — Telephone Encounter (Signed)
RX called in at [place]

## 2017-05-05 NOTE — Telephone Encounter (Signed)
Patient called office concerning clonazepam rx refusal. Patient states that due to his disability he has lost his home and car. Therefore he does not have any transportation. Patient states that it may be over 2 weeks before he can come in for appt. Patient is rx until he can be seen. Please advise?

## 2017-05-05 NOTE — Progress Notes (Signed)
Remote ICD transmission.   

## 2017-05-05 NOTE — Telephone Encounter (Signed)
See refill request.

## 2017-05-06 LAB — CUP PACEART REMOTE DEVICE CHECK
Battery Remaining Longevity: 98 mo
Brady Statistic RV Percent Paced: 0.01 %
HIGH POWER IMPEDANCE MEASURED VALUE: 95 Ohm
Implantable Lead Serial Number: 327195
Lead Channel Impedance Value: 646 Ohm
Lead Channel Impedance Value: 665 Ohm
Lead Channel Sensing Intrinsic Amplitude: 9.25 mV
Lead Channel Setting Pacing Pulse Width: 0.4 ms
Lead Channel Setting Sensing Sensitivity: 0.3 mV
MDC IDC LEAD IMPLANT DT: 20150318
MDC IDC LEAD LOCATION: 753860
MDC IDC MSMT BATTERY VOLTAGE: 2.99 V
MDC IDC MSMT LEADCHNL RV PACING THRESHOLD AMPLITUDE: 1 V
MDC IDC MSMT LEADCHNL RV PACING THRESHOLD PULSEWIDTH: 0.4 ms
MDC IDC MSMT LEADCHNL RV SENSING INTR AMPL: 9.25 mV
MDC IDC PG IMPLANT DT: 20150318
MDC IDC SESS DTM: 20181001020355
MDC IDC SET LEADCHNL RV PACING AMPLITUDE: 2 V

## 2017-05-07 ENCOUNTER — Emergency Department (HOSPITAL_COMMUNITY)
Admission: EM | Admit: 2017-05-07 | Discharge: 2017-05-07 | Disposition: A | Discharge status: Home / Self Care | Payer: Medicaid Other | Attending: Emergency Medicine | Admitting: Emergency Medicine

## 2017-05-07 ENCOUNTER — Emergency Department (HOSPITAL_COMMUNITY): Payer: Medicaid Other

## 2017-05-07 ENCOUNTER — Encounter (HOSPITAL_COMMUNITY): Payer: Self-pay | Admitting: Emergency Medicine

## 2017-05-07 DIAGNOSIS — Z79899 Other long term (current) drug therapy: Secondary | ICD-10-CM | POA: Diagnosis not present

## 2017-05-07 DIAGNOSIS — Z951 Presence of aortocoronary bypass graft: Secondary | ICD-10-CM | POA: Diagnosis not present

## 2017-05-07 DIAGNOSIS — R0602 Shortness of breath: Secondary | ICD-10-CM | POA: Insufficient documentation

## 2017-05-07 DIAGNOSIS — I5042 Chronic combined systolic (congestive) and diastolic (congestive) heart failure: Secondary | ICD-10-CM | POA: Insufficient documentation

## 2017-05-07 DIAGNOSIS — Z95810 Presence of automatic (implantable) cardiac defibrillator: Secondary | ICD-10-CM | POA: Insufficient documentation

## 2017-05-07 DIAGNOSIS — R531 Weakness: Secondary | ICD-10-CM | POA: Insufficient documentation

## 2017-05-07 DIAGNOSIS — N185 Chronic kidney disease, stage 5: Secondary | ICD-10-CM | POA: Insufficient documentation

## 2017-05-07 DIAGNOSIS — Z7982 Long term (current) use of aspirin: Secondary | ICD-10-CM | POA: Diagnosis not present

## 2017-05-07 DIAGNOSIS — R110 Nausea: Secondary | ICD-10-CM | POA: Insufficient documentation

## 2017-05-07 DIAGNOSIS — I132 Hypertensive heart and chronic kidney disease with heart failure and with stage 5 chronic kidney disease, or end stage renal disease: Secondary | ICD-10-CM | POA: Insufficient documentation

## 2017-05-07 DIAGNOSIS — I2510 Atherosclerotic heart disease of native coronary artery without angina pectoris: Secondary | ICD-10-CM | POA: Diagnosis not present

## 2017-05-07 DIAGNOSIS — Z955 Presence of coronary angioplasty implant and graft: Secondary | ICD-10-CM | POA: Insufficient documentation

## 2017-05-07 LAB — URINALYSIS, ROUTINE W REFLEX MICROSCOPIC
BILIRUBIN URINE: NEGATIVE
GLUCOSE, UA: NEGATIVE mg/dL
HGB URINE DIPSTICK: NEGATIVE
KETONES UR: NEGATIVE mg/dL
Leukocytes, UA: NEGATIVE
Nitrite: NEGATIVE
PROTEIN: NEGATIVE mg/dL
Specific Gravity, Urine: 1.012 (ref 1.005–1.030)
pH: 5 (ref 5.0–8.0)

## 2017-05-07 LAB — RAPID URINE DRUG SCREEN, HOSP PERFORMED
Amphetamines: NOT DETECTED
Barbiturates: NOT DETECTED
Benzodiazepines: POSITIVE — AB
Cocaine: NOT DETECTED
Opiates: POSITIVE — AB
Tetrahydrocannabinol: NOT DETECTED

## 2017-05-07 LAB — I-STAT TROPONIN, ED
TROPONIN I, POC: 0 ng/mL (ref 0.00–0.08)
TROPONIN I, POC: 0 ng/mL (ref 0.00–0.08)

## 2017-05-07 LAB — BRAIN NATRIURETIC PEPTIDE: B NATRIURETIC PEPTIDE 5: 72.6 pg/mL (ref 0.0–100.0)

## 2017-05-07 LAB — CBC
HCT: 43.6 % (ref 39.0–52.0)
Hemoglobin: 14.9 g/dL (ref 13.0–17.0)
MCH: 31.6 pg (ref 26.0–34.0)
MCHC: 34.2 g/dL (ref 30.0–36.0)
MCV: 92.6 fL (ref 78.0–100.0)
PLATELETS: 138 10*3/uL — AB (ref 150–400)
RBC: 4.71 MIL/uL (ref 4.22–5.81)
RDW: 13.7 % (ref 11.5–15.5)
WBC: 8.2 10*3/uL (ref 4.0–10.5)

## 2017-05-07 LAB — BASIC METABOLIC PANEL
Anion gap: 8 (ref 5–15)
BUN: 35 mg/dL — ABNORMAL HIGH (ref 6–20)
CALCIUM: 9.1 mg/dL (ref 8.9–10.3)
CHLORIDE: 106 mmol/L (ref 101–111)
CO2: 21 mmol/L — ABNORMAL LOW (ref 22–32)
CREATININE: 2.88 mg/dL — AB (ref 0.61–1.24)
GFR calc non Af Amer: 22 mL/min — ABNORMAL LOW (ref >60)
GFR, EST AFRICAN AMERICAN: 26 mL/min — AB (ref >60)
Glucose, Bld: 165 mg/dL — ABNORMAL HIGH (ref 65–99)
Potassium: 3.1 mmol/L — ABNORMAL LOW (ref 3.5–5.1)
SODIUM: 135 mmol/L (ref 135–145)

## 2017-05-07 LAB — AMMONIA: AMMONIA: 37 umol/L — AB (ref 9–35)

## 2017-05-07 MED ORDER — POTASSIUM CHLORIDE CRYS ER 20 MEQ PO TBCR
40.0000 meq | EXTENDED_RELEASE_TABLET | Freq: Once | ORAL | Status: AC
  Administered 2017-05-07: 40 meq via ORAL
  Filled 2017-05-07: qty 2

## 2017-05-07 NOTE — Progress Notes (Signed)
Per ALF representative, CSW faxed over mediation list and pt history to [facility]. CSW will continue to assist with discharge needs.   Virgie Dad Sharlisa Hollifield, MSW, [facility] Clinical Social Worker [phone]

## 2017-05-07 NOTE — ED Notes (Signed)
CSW at bedside.
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
## 2017-05-07 NOTE — ED Notes (Signed)
Pt verbalized understanding discharge instructions and denies any further needs or questions at this time. VS stable, ambulatory and steady gait.   

## 2017-05-07 NOTE — Progress Notes (Signed)
CSW consulted for placement option for pt. CSW spoke with pt and was informed that pt has Medicaid and would like placement in an ALF. CSW explained to pt that being placed in an ALF has certain requirements regarding needs, but would have an representative for ALF's to come speak with pt to access needs and to determine if ALF placement is an option due to insurance at this time. CSW also informed pt that if pt has community Medicaid, then that does not cover ALF/SNF placements so pt would be paying out of pocket if going to an ALF. Pt was informed that ALF's can be costly at times.   CSW informed pt that if pt doe snot meet needs to ALF placement, then pt could potentially be given a shelter list at the time of discharge if pt doesn't have a place to go. Pt expressed that pt understood. CSW will handoff to evening CSW to continue to follow with pt needs at this time.    Richard Cook, MSW, [facility] Clinical Social Worker [phone]

## 2017-05-07 NOTE — Discharge Instructions (Signed)
You presented to the ED for shortness of breath on exertion and generalized weakness. Your blood tests, EKG, chest x-ray were unchanged from previous. Please call your cardiologist and make an appointment as soon as possible for reevaluation. Continue taking all your medications as prescribed.

## 2017-05-07 NOTE — ED Triage Notes (Signed)
Pt arrives via [facility] from [facility], c/o SOB, worse with exertion, x 3 days, denies CP,  fevers., cough, SOB.  Pt c/o increased generalized weakness and nausea .  EMS reports pt was at [employer] trying to get into a care facility.  Pt reports living at a motel at this time. Pt denies pain.

## 2017-05-07 NOTE — ED Provider Notes (Signed)
  Face-to-face evaluation   History: He presents for evaluation of shortness of breath with exertion, and general weakness.  He has been trying to get disability..  He has had 2 recent ED visits.  He states he ran out of his pain medicine which he takes for chronic back pain, 7 days ago.  This is because he was unable to get to his doctor in [place] and he is currently living in [place].  He complains of a dizzy feeling when he tries to walk, and gets short of breath with exertion.  He is requesting pain medicine.  Clinical Course as of May 10 1256  Fri May 07, 2017  1349 [facility] Report-drug databank, review, he receives hydrocodone, regularly, sometimes twice a month, number 60, 10/325 mg  [EW]  1619 Opiates: (!) POSITIVE [CG]  1619 Benzodiazepines: (!) POSITIVE [CG]  1619 Creatinine: (!) 2.88 [CG]  1619 Potassium: (!) 3.1 [CG]  1713 Ammonia: (!) 37 [CG]    Clinical Course User Index [CG] Kinnie Feil, PA-C [EW] Daleen Bo, MD      Physical exam: Alert, calm.  He appears older than stated age.  Heart regular rate and rhythm no murmur lungs clear to auscultation.  No asterixis.  No pronator drift.  Normal range of motion arms and legs bilaterally.  No peripheral edema.  Medical screening examination/treatment/procedure(s) were conducted as a shared visit with non-physician practitioner(s) and myself.  I personally evaluated the patient during the encounter   Daleen Bo, MD 05/10/17 1257

## 2017-05-07 NOTE — ED Provider Notes (Signed)
Patient was handed off to me by previous ED PA pending urine drug screen, ammonia level, delta troponin. Anticipate discharge if remaining workup is benign. Please repeat previous ED PA note for full details. Briefly, patient is a 61-year-old male with history of CAD with AICD, CK 80, CHF who presents for evaluation of gradually worsening shortness of breath with exertion 3 days and generalized weakness. Of note, patient has been trying to get disability at [employer].  ED workup reviewed. CBC without leukocytosis or anemia. BMP shows hypokalemia with potassium 3.1, we'll replete orally. Creatinine elevated at 2.8, this is much better than his previous. Urinalysis without infection. Drug screen positive for opiates and benzodiazepines, he is on chronic opioid prescriptions.EKG and chest x-ray unremarkable. Pending ammonia and delta troponin.  1515: Ammonia and delta trop negative. On exam, pt is found asleep in NAD. RRR. Lungs CTAB. 2+ distal pulses bilaterally. VS WNL. He has odd affect and responds with one work answers, keeps his eyes closed. Will PO challenge and ambulate with plan do d/c.    Kinnie Feil, PA-C 05/07/17 1714    Lajean Saver, MD 05/07/17 2008

## 2017-05-07 NOTE — ED Notes (Signed)
Patient transported to X-ray 

## 2017-05-13 LAB — COMPREHENSIVE METABOLIC PANEL
AG Ratio: 1.4 (calc) (ref 1.0–2.5)
ALBUMIN MSPROF: 4 g/dL (ref 3.6–5.1)
ALT: 18 U/L (ref 9–46)
AST: 21 U/L (ref 10–35)
Alkaline phosphatase (APISO): 87 U/L (ref 40–115)
BILIRUBIN TOTAL: 1.1 mg/dL (ref 0.2–1.2)
BUN / CREAT RATIO: 13 (calc) (ref 6–22)
BUN: 45 mg/dL — AB (ref 7–25)
CALCIUM: 9.4 mg/dL (ref 8.6–10.3)
CO2: 26 mmol/L (ref 20–32)
CREATININE: 3.47 mg/dL — AB (ref 0.70–1.25)
Chloride: 99 mmol/L (ref 98–110)
GLUCOSE: 101 mg/dL — AB (ref 65–99)
Globulin: 2.8 g/dL (calc) (ref 1.9–3.7)
POTASSIUM: 3.9 mmol/L (ref 3.5–5.3)
SODIUM: 139 mmol/L (ref 135–146)
TOTAL PROTEIN: 6.8 g/dL (ref 6.1–8.1)

## 2017-05-13 LAB — VITAMIN B12: VITAMIN B 12: 224 pg/mL (ref 200–1100)

## 2017-05-13 LAB — LIPID PANEL
CHOL/HDL RATIO: 5 (calc) — AB (ref <5.0)
Cholesterol: 222 mg/dL — ABNORMAL HIGH (ref <200)
HDL: 44 mg/dL (ref >40)
LDL CHOLESTEROL (CALC): 151 mg/dL — AB
NON-HDL CHOLESTEROL (CALC): 178 mg/dL — AB (ref <130)
TRIGLYCERIDES: 148 mg/dL (ref <150)

## 2017-05-13 LAB — CBC
HEMATOCRIT: 41.2 % (ref 38.5–50.0)
Hemoglobin: 13.8 g/dL (ref 13.2–17.1)
MCH: 31.2 pg (ref 27.0–33.0)
MCHC: 33.5 g/dL (ref 32.0–36.0)
MCV: 93 fL (ref 80.0–100.0)
MPV: 11.6 fL (ref 7.5–12.5)
PLATELETS: 165 10*3/uL (ref 140–400)
RBC: 4.43 10*6/uL (ref 4.20–5.80)
RDW: 13 % (ref 11.0–15.0)
WBC: 6.4 10*3/uL (ref 3.8–10.8)

## 2017-05-13 LAB — AMMONIA: AMMONIA: 92 umol/L — AB (ref <=72)

## 2017-05-13 LAB — VITAMIN D 25 HYDROXY (VIT D DEFICIENCY, FRACTURES): VIT D 25 HYDROXY: 26 ng/mL — AB (ref 30–100)

## 2017-08-04 ENCOUNTER — Encounter: Payer: Medicaid Other | Admitting: *Deleted

## 2017-08-04 ENCOUNTER — Telehealth: Payer: Self-pay | Admitting: Cardiology

## 2017-08-04 NOTE — Telephone Encounter (Signed)
Attempted to confirm remote transmission with pt. No answer and was unable to leave a message.   

## 2017-08-06 ENCOUNTER — Encounter: Payer: Self-pay | Admitting: Internal Medicine

## 2017-08-06 ENCOUNTER — Encounter: Payer: Self-pay | Admitting: Cardiology

## 2017-09-01 ENCOUNTER — Other Ambulatory Visit: Payer: Self-pay | Admitting: Family Medicine

## 2017-09-01 ENCOUNTER — Encounter: Payer: Self-pay | Admitting: Family Medicine

## 2017-09-01 ENCOUNTER — Ambulatory Visit (INDEPENDENT_AMBULATORY_CARE_PROVIDER_SITE_OTHER): Payer: Medicaid Other | Admitting: Family Medicine

## 2017-09-01 VITALS — BP 162/104 | HR 77 | Temp 98.6°F | Resp 16 | Wt 194.0 lb

## 2017-09-01 DIAGNOSIS — I472 Ventricular tachycardia, unspecified: Secondary | ICD-10-CM

## 2017-09-01 DIAGNOSIS — M109 Gout, unspecified: Secondary | ICD-10-CM

## 2017-09-01 DIAGNOSIS — I5023 Acute on chronic systolic (congestive) heart failure: Secondary | ICD-10-CM | POA: Diagnosis not present

## 2017-09-01 DIAGNOSIS — Z91199 Patient's noncompliance with other medical treatment and regimen due to unspecified reason: Secondary | ICD-10-CM

## 2017-09-01 DIAGNOSIS — F411 Generalized anxiety disorder: Secondary | ICD-10-CM

## 2017-09-01 DIAGNOSIS — Z9119 Patient's noncompliance with other medical treatment and regimen: Secondary | ICD-10-CM | POA: Diagnosis not present

## 2017-09-01 DIAGNOSIS — R7989 Other specified abnormal findings of blood chemistry: Secondary | ICD-10-CM | POA: Diagnosis not present

## 2017-09-01 DIAGNOSIS — I2510 Atherosclerotic heart disease of native coronary artery without angina pectoris: Secondary | ICD-10-CM

## 2017-09-01 DIAGNOSIS — N183 Chronic kidney disease, stage 3 unspecified: Secondary | ICD-10-CM

## 2017-09-01 DIAGNOSIS — I10 Essential (primary) hypertension: Secondary | ICD-10-CM | POA: Diagnosis not present

## 2017-09-01 MED ORDER — PAROXETINE HCL 20 MG PO TABS: 20.0000 mg | ORAL_TABLET | Freq: Every day | ORAL | 4 refills | Status: DC

## 2017-09-01 MED ORDER — ALLOPURINOL 100 MG PO TABS: 100.0000 mg | ORAL_TABLET | Freq: Every day | ORAL | 4 refills | Status: DC

## 2017-09-01 MED ORDER — ROSUVASTATIN CALCIUM 40 MG PO TABS: 40.0000 mg | ORAL_TABLET | Freq: Every day | ORAL | 11 refills | Status: DC

## 2017-09-01 MED ORDER — CARVEDILOL 3.125 MG PO TABS: 3.1250 mg | ORAL_TABLET | Freq: Two times a day (BID) | ORAL | 2 refills | Status: DC

## 2017-09-01 MED ORDER — HYDROCODONE-ACETAMINOPHEN 10-325 MG PO TABS: 1.0000 | ORAL_TABLET | Freq: Three times a day (TID) | ORAL | 0 refills | Status: DC | PRN

## 2017-09-01 NOTE — Progress Notes (Signed)
Patient: Richard Cook Male    DOB: 01/23/1956   62 y.o.   MRN: [id number] Visit Date: 09/01/2017  Today's Provider: Lelon Huh, MD   Chief Complaint  Patient presents with  . Follow-up   Subjective:    HPI    Lipid/Cholesterol, Follow-up:   Last seen for this 4 months ago.  Management since that visit includes; labs checked, showing cholesterol is to high. Started rosuvastatin 40 mg qd.which he has since run out of.   Last Lipid Panel:    Component Value Date/Time   CHOL 222 (H) 04/07/2017 1101   TRIG 148 04/07/2017 1101   HDL 44 04/07/2017 1101   CHOLHDL 5.0 (H) 04/07/2017 1101   VLDL 27 09/06/2016 0216   LDLCALC 92 09/06/2016 0216    He reports poor compliance with treatment. Has been out of medication for several months. He is not having side effects.   Wt Readings from Last 3 Encounters:  04/14/17 196 lb (88.9 kg)  04/07/17 197 lb (89.4 kg)  03/23/17 193 lb 1.6 oz (87.6 kg)    ------------------------------------------------------------------------  CKD He was referred to Dr. Candiss Norse in September, but he ended up going to ER day of referral has not rescheduled appointment. He was on furosemide at that time but has since ran out.    Follow up of Anxiety:    Patient comes in reporting that he has been out of Klonopin for the past 2 months. Patient feels that his Anxiety has worsened since he has been out of medication.   Ventricular tachycardia Had ablation per Dr. [name] last fall. He had been on amiodarone which patient states he was taking until he ran out of all of his medications last month. However it looks like this was being weaned after ablation and I'm not sure if plan was for him to continue medication. He denies any unusual dyspnea, palpitations, racing heart or chest pains.   He was also started on carvedilol at last cardiology visit, which he states he ran out of a month ago and has not called for refills.      BP Readings from  Last 5 Encounters:  09/01/17 (!) 162/104  05/07/17 126/86  05/03/17 115/85  04/30/17 (!) 135/100  04/14/17 110/80   Has been out of all his medications for the last month  Is seeing cardiology in 09-03-2016   Allergies  Allergen Reactions  . Eggs Or Egg-Derived Products Hives  . Codeine Other (See Comments)    Tolerates Hydrocodone (??)     Current Outpatient Medications:  .  Cholecalciferol (EQL VITAMIN D3) 2000 units CAPS, Take 2,000 Units by mouth daily., Disp: , Rfl:  .  Omega-3 Fatty Acids (FISH OIL PO), Take 1 capsule by mouth daily., Disp: , Rfl:  .  amiodarone (PACERONE) 200 MG tablet, Take 200 mg by mouth daily., Disp: , Rfl: (NOT TAKING) .  aspirin EC 81 MG tablet, Take 81 mg by mouth daily., Disp: , Rfl:  .  carvedilol (COREG) 3.125 MG tablet, Take 1 tablet (3.125 mg total) by mouth 2 (two) times daily., Disp: 60 tablet, Rfl: 2 (NOT TAKING) .  Colchicine (MITIGARE) 0.6 MG CAPS, Take 1 tablet by mouth daily. (Patient not taking: Reported on 09/01/2017), Disp: 30 capsule, Rfl: 1(NOT TAKING) .  HYDROcodone-acetaminophen (NORCO) 10-325 MG tablet, Take 1 tablet by mouth every 8 (eight) hours as needed. (Patient not taking: Reported on 09/01/2017), Disp: 60 tablet, Rfl: 0(NOT TAKING)  .  rosuvastatin (  CRESTOR) 40 MG tablet, Take 1 tablet (40 mg total) by mouth daily., Disp: 30 tablet, Rfl: 11 (NOT TAKING)  Review of Systems  Constitutional: Negative for appetite change, chills and fever.  Respiratory: Negative for chest tightness, shortness of breath and wheezing.   Cardiovascular: Negative for chest pain and palpitations.  Gastrointestinal: Negative for abdominal pain, nausea and vomiting.  Musculoskeletal: Positive for back pain.  Psychiatric/Behavioral: The patient is nervous/anxious.     Social History   Tobacco Use  . Smoking status: Never Smoker  . Smokeless tobacco: Never Used  Substance Use Topics  . Alcohol use: No    Comment: 10/07/2016 "quit in the early 1990s"    Objective:   BP (!) 162/104 (BP Location: Right Arm, Cuff Size: Large)   Pulse 77   Temp 98.6 F (37 C) (Oral)   Resp 16   Wt 194 lb (88 kg)   SpO2 96% Comment: room air  BMI 27.84 kg/m  Vitals:   09/01/17 1345 09/01/17 1351  BP: (!) 160/102 (!) 162/104  Pulse: 77   Resp: 16   Temp: 98.6 F (37 C)   TempSrc: Oral   SpO2: 96%   Weight: 194 lb (88 kg)      Physical Exam   General Appearance:    Alert, cooperative, no distress  Eyes:    PERRL, conjunctiva/corneas clear, EOM's intact       Lungs:     Clear to auscultation bilaterally, respirations unlabored  Heart:    Regular rate and rhythm. 2+ bipedal edema.   Neurologic:   Awake, alert, oriented x 3. No apparent focal neurological           defect.           Assessment & Plan:     1. Essential (primary) hypertension Uncontrolled since running out of carvedilol. Sent refill to his pharmacy to start back on today.   2. Ventricular tachycardia ([place]) S/p ablation currently regular rhythm. He is currently out of amiodarone but has appointment with cardiology on 09-03-2017 to discuss.  - Comprehensive metabolic panel  3. Increased ammonia level  - Ammonia  4. Acute on chronic systolic CHF (congestive heart failure) (HCC)  - Comprehensive metabolic panel  5. Gout, unspecified cause, unspecified chronicity, unspecified site Did well on allopurinol in the past, but now having a few flares every month. Restart allopurinol. Advised that this may trigger flare in the short term and can take prednisone or colchicine if so.  - Uric acid  6. Chronic kidney disease (CKD), stage III (moderate) ([place]) Missed appointment for nephrology made in September. Will likely need to reschedule for him after reviewing labs.   7. Anxiety state He doesn't think he has every tried SSRI. Advised that these are less addictive then clonazepam. Will try paroxetine 20mg  a day.   8. CAD Restart rosuvastatin  9. Non-compliance Reinforced  importance of notifying prescribing physician of PCP (me) before medications run out.        Lelon Huh, MD  [facility]

## 2017-09-01 NOTE — Telephone Encounter (Signed)
Pt stated he was in the office today and Dr. Caryn Section refilled all his medication but must have forgotten HYDROcodone-acetaminophen (NORCO) 10-325 MG tablet.  Pt contacted office for refill request on the following medications:  HYDROcodone-acetaminophen (NORCO) 10-325 MG tablet   [facility] [place]  Please advise. Thanks TNP
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
## 2017-09-02 LAB — COMPREHENSIVE METABOLIC PANEL
ALK PHOS: 102 IU/L (ref 39–117)
ALT: 8 IU/L (ref 0–44)
AST: 14 IU/L (ref 0–40)
Albumin/Globulin Ratio: 1.7 (ref 1.2–2.2)
Albumin: 4.1 g/dL (ref 3.6–4.8)
BILIRUBIN TOTAL: 0.4 mg/dL (ref 0.0–1.2)
BUN/Creatinine Ratio: 11 (ref 10–24)
BUN: 20 mg/dL (ref 8–27)
CHLORIDE: 109 mmol/L — AB (ref 96–106)
CO2: 19 mmol/L — AB (ref 20–29)
CREATININE: 1.78 mg/dL — AB (ref 0.76–1.27)
Calcium: 9.1 mg/dL (ref 8.6–10.2)
GFR calc Af Amer: 47 mL/min/{1.73_m2} — ABNORMAL LOW (ref >59)
GFR calc non Af Amer: 41 mL/min/{1.73_m2} — ABNORMAL LOW (ref >59)
Globulin, Total: 2.4 g/dL (ref 1.5–4.5)
Glucose: 98 mg/dL (ref 65–99)
Potassium: 3.9 mmol/L (ref 3.5–5.2)
Sodium: 144 mmol/L (ref 134–144)
Total Protein: 6.5 g/dL (ref 6.0–8.5)

## 2017-09-02 LAB — AMMONIA: AMMONIA: 73 ug/dL (ref 27–102)

## 2017-09-02 LAB — URIC ACID: URIC ACID: 8 mg/dL (ref 3.7–8.6)

## 2017-09-02 NOTE — Telephone Encounter (Signed)
Patient advised.

## 2017-09-03 ENCOUNTER — Encounter (INDEPENDENT_AMBULATORY_CARE_PROVIDER_SITE_OTHER): Payer: Self-pay

## 2017-09-03 ENCOUNTER — Ambulatory Visit: Payer: Medicaid Other | Admitting: Physician Assistant

## 2017-09-03 ENCOUNTER — Telehealth: Payer: Self-pay | Admitting: Emergency Medicine

## 2017-09-03 VITALS — BP 144/92 | HR 67 | Ht 70.0 in | Wt 189.0 lb

## 2017-09-03 DIAGNOSIS — I10 Essential (primary) hypertension: Secondary | ICD-10-CM

## 2017-09-03 DIAGNOSIS — I5022 Chronic systolic (congestive) heart failure: Secondary | ICD-10-CM | POA: Diagnosis not present

## 2017-09-03 DIAGNOSIS — I472 Ventricular tachycardia, unspecified: Secondary | ICD-10-CM

## 2017-09-03 DIAGNOSIS — I255 Ischemic cardiomyopathy: Secondary | ICD-10-CM | POA: Diagnosis not present

## 2017-09-03 DIAGNOSIS — Z79899 Other long term (current) drug therapy: Secondary | ICD-10-CM

## 2017-09-03 LAB — TSH: TSH: 5.16 u[IU]/mL — ABNORMAL HIGH (ref 0.450–4.500)

## 2017-09-03 MED ORDER — FUROSEMIDE 40 MG PO TABS: 40.0000 mg | ORAL_TABLET | Freq: Every day | ORAL | 3 refills | Status: DC | PRN

## 2017-09-03 MED ORDER — AMIODARONE HCL 200 MG PO TABS: 200.0000 mg | ORAL_TABLET | Freq: Every day | ORAL | 3 refills | Status: DC

## 2017-09-03 NOTE — Patient Instructions (Addendum)
Medication Instructions:   START TAKING FUROSEMIDE 40 MG   ONCE A DAY AS NEEDED  FOR WEIGHT GAIN OF 3LBS IN A DAY OR 5LBS IN A WEEK   If you need a refill on your cardiac medications before your next appointment, please call your pharmacy.  Labwork: TSH  TODAY    Testing/Procedures: NONE ORDERED  TODAY    Follow-Up:  IN 4 MONTHS WITH KLEIN   Remote monitoring is used to monitor your Pacemaker of ICD from home. This monitoring reduces the number of office visits required to check your device to one time per year. It allows [place] to keep an eye on the functioning of your device to ensure it is working properly. You are scheduled for a device check from home on .  12-02-17 You may send your transmission at any time that day. If you have a wireless device, the transmission will be sent automatically. After your physician reviews your transmission, you will receive a postcard with your next transmission date.      Any Other Special Instructions Will Be Listed Below (If Applicable).

## 2017-09-03 NOTE — Telephone Encounter (Signed)
-----   Message from Birdie Sons, MD sent at 09/03/2017  8:02 AM EST ----- Kidney functions have improved a little bit since October. He needs referral to nephrology. Needs to schedule follow up here in 4 months.

## 2017-09-03 NOTE — Telephone Encounter (Signed)
VM no set up yet. Will try again later.

## 2017-09-03 NOTE — Telephone Encounter (Signed)
Pt advised. Pt will call back for an appt.

## 2017-09-08 ENCOUNTER — Telehealth: Payer: Self-pay | Admitting: *Deleted

## 2017-09-08 DIAGNOSIS — R7989 Other specified abnormal findings of blood chemistry: Secondary | ICD-10-CM

## 2017-09-08 NOTE — Telephone Encounter (Signed)
-----   Message from [place], [place] sent at 09/03/2017  5:08 PM EST ----- Please let him know his thyroid level came back a little high, repeat in 2-3 weeks please with Free T3,T4  Thanks renee

## 2017-09-17 ENCOUNTER — Ambulatory Visit (INDEPENDENT_AMBULATORY_CARE_PROVIDER_SITE_OTHER): Payer: Medicaid Other | Admitting: Family Medicine

## 2017-09-17 ENCOUNTER — Encounter: Payer: Self-pay | Admitting: Family Medicine

## 2017-09-17 VITALS — BP 148/104 | HR 63 | Temp 98.0°F | Resp 16 | Ht 70.0 in | Wt 191.0 lb

## 2017-09-17 DIAGNOSIS — F321 Major depressive disorder, single episode, moderate: Secondary | ICD-10-CM

## 2017-09-17 DIAGNOSIS — I10 Essential (primary) hypertension: Secondary | ICD-10-CM | POA: Diagnosis not present

## 2017-09-17 DIAGNOSIS — F411 Generalized anxiety disorder: Secondary | ICD-10-CM

## 2017-09-17 MED ORDER — CLONAZEPAM 1 MG PO TABS: 1.0000 mg | ORAL_TABLET | Freq: Three times a day (TID) | ORAL | 2 refills | Status: DC | PRN

## 2017-09-17 NOTE — Progress Notes (Signed)
Patient: Richard Cook Male    DOB: [birth year]   62 y.o.   MRN: [id number] Visit Date: 09/17/2017  Today's Provider: Lelon Huh, MD   Chief Complaint  Patient presents with  . Follow-up  . Anxiety  . Hypertension   Subjective:    HPI  Anxiety state From 09/01/2017-started trial of paroxetine 20mg  a day. He states he continues to feel very anxious all the time, feeling like on verge of panic attack. He was previously treated with high doses of clonazepam which he took himself off of last year.  Follow up hypertension Patient also wants to discuss elevated blood pressure. Patient stated his blood pressure has been running about 144/98 on home readings.  States he is taking his medications consistently, but home Bps are still usually running 160s/110s  Allergies  Allergen Reactions  . Eggs Or Egg-Derived Products Hives  . Codeine Other (See Comments)    Tolerates Hydrocodone (??)     Current Outpatient Medications:  .  allopurinol (ZYLOPRIM) 100 MG tablet, Take 1 tablet (100 mg total) by mouth daily., Disp: 90 tablet, Rfl: 4 .  amiodarone (PACERONE) 200 MG tablet, Take 1 tablet (200 mg total) by mouth daily., Disp: 30 tablet, Rfl: 3 .  aspirin EC 81 MG tablet, Take 81 mg by mouth daily., Disp: , Rfl:  .  carvedilol (COREG) 3.125 MG tablet, Take 1 tablet (3.125 mg total) by mouth 2 (two) times daily., Disp: 60 tablet, Rfl: 2 .  Cholecalciferol (EQL VITAMIN D3) 2000 units CAPS, Take 2,000 Units by mouth daily., Disp: , Rfl:  .  Colchicine (MITIGARE) 0.6 MG CAPS, Take 1 tablet by mouth daily., Disp: 30 capsule, Rfl: 1 .  furosemide (LASIX) 40 MG tablet, Take 1 tablet (40 mg total) by mouth daily as needed (WEIGHT GAIN 3LBS IN 24 HRS 5LBS IN A WEEK)., Disp: 30 tablet, Rfl: 3 .  HYDROcodone-acetaminophen (NORCO) 10-325 MG tablet, Take 1 tablet by mouth every 8 (eight) hours as needed., Disp: 60 tablet, Rfl: 0 .  lactulose (CHRONULAC) 10 GM/15ML solution, Take 45 mLs  (30 g total) by mouth 3 (three) times daily., Disp: 500 mL, Rfl: 0 .  Omega-3 Fatty Acids (FISH OIL PO), Take 1 capsule by mouth daily., Disp: , Rfl:  .  PARoxetine (PAXIL) 20 MG tablet, Take 1 tablet (20 mg total) by mouth daily., Disp: 30 tablet, Rfl: 4 .  rosuvastatin (CRESTOR) 40 MG tablet, Take 1 tablet (40 mg total) by mouth daily., Disp: 30 tablet, Rfl: 11  Review of Systems  Constitutional: Negative for appetite change, chills and fever.  Respiratory: Negative for chest tightness, shortness of breath and wheezing.   Cardiovascular: Negative for chest pain and palpitations.  Gastrointestinal: Negative for abdominal pain, nausea and vomiting.    Social History   Tobacco Use  . Smoking status: Never Smoker  . Smokeless tobacco: Never Used  Substance Use Topics  . Alcohol use: No    Comment: 10/07/2016 "quit in the early 1990s"   Objective:   BP (!) 160/100 (BP Location: Right Arm, Patient Position: Sitting, Cuff Size: Normal)   Pulse 63   Temp 98 F (36.7 C) (Oral)   Resp 16   Ht 5\' 10"  (1.778 m)   Wt 191 lb (86.6 kg)   SpO2 97%   BMI 27.41 kg/m  Vitals:   09/17/17 1459  BP: (!) 160/100  Pulse: 63  Resp: 16  Temp: 98 F (36.7 C)  TempSrc: Oral  SpO2: 97%  Weight: 191 lb (86.6 kg)  Height: 5\' 10"  (1.778 m)     Physical Exam   General Appearance:    Alert, cooperative, no distress  Eyes:    PERRL, conjunctiva/corneas clear, EOM's intact       Lungs:     Clear to auscultation bilaterally, respirations unlabored  Heart:    Regular rate and rhythm  Neurologic:   Awake, alert, oriented x 3. No apparent focal neurological           defect.           Assessment & Plan:     1. Anxiety state Recommend he continue paroxetine as it often takes several weeks for therapeutic effect. Sx were controlled with clonazepam in the past so will start back on that until paroxetine starts working - clonazePAM (KLONOPIN) 1 MG tablet; Take 1 tablet (1 mg total) by mouth 3  (three) times daily as needed.  Dispense: 90 tablet; Refill: 2  2. Depression, major, single episode, moderate ([place]) Expect this to improve with paroxetine.   3. Essential (primary) hypertension Continue same medications for the time being, expect improvement as anxiety improves.   Return in about 6 weeks (around 10/29/2017).       Lelon Huh, MD  [facility]

## 2017-09-20 ENCOUNTER — Telehealth: Payer: Self-pay | Admitting: Family Medicine

## 2017-09-20 NOTE — Telephone Encounter (Signed)
Patient states that he needs a order for a brace for foot drop sent to [facility] at [phone].

## 2017-09-20 NOTE — Telephone Encounter (Signed)
Please advise 

## 2017-09-22 ENCOUNTER — Other Ambulatory Visit: Payer: Medicaid Other | Admitting: *Deleted

## 2017-09-22 DIAGNOSIS — R7989 Other specified abnormal findings of blood chemistry: Secondary | ICD-10-CM

## 2017-09-23 LAB — TSH: TSH: 9.33 u[IU]/mL — ABNORMAL HIGH (ref 0.450–4.500)

## 2017-09-23 LAB — T4, FREE: FREE T4: 1.26 ng/dL (ref 0.82–1.77)

## 2017-09-23 LAB — T3, FREE: T3 FREE: 3.2 pg/mL (ref 2.0–4.4)

## 2017-09-23 NOTE — Telephone Encounter (Signed)
Can you please call [facility] about this. I have no idea exactly what he is wanting ordered. I haven't seen him for foot drop.

## 2017-09-23 NOTE — Telephone Encounter (Signed)
Per [facility], patient will need to see his pcp for ov to discuss his symptoms. Then if pcp deems it necessary for pt to need a brace, we can fax an order to their office with office notes. Contact to their office has to be through a provider not the patient.

## 2017-09-23 NOTE — Telephone Encounter (Signed)
Advised pt of message below. Patient stated that a brace was ordered for him several years ago for drop foot, could have been over 10 years ago.

## 2017-09-27 ENCOUNTER — Telehealth: Payer: Self-pay | Admitting: Family Medicine

## 2017-09-27 DIAGNOSIS — M21379 Foot drop, unspecified foot: Secondary | ICD-10-CM

## 2017-09-27 NOTE — Telephone Encounter (Signed)
Pt is requesting referral to ortho for foot drop.Needs a brace and was previously seen by ortho in [place] that helped him with this some this some years back

## 2017-09-27 NOTE — Telephone Encounter (Signed)
Ok to refer? Please advise. Thanks!  

## 2017-09-29 ENCOUNTER — Telehealth: Payer: Self-pay | Admitting: *Deleted

## 2017-09-29 DIAGNOSIS — R7989 Other specified abnormal findings of blood chemistry: Secondary | ICD-10-CM

## 2017-09-29 NOTE — Telephone Encounter (Signed)
SPOKE TO PT ABOUT RESULTS AND WILL RETURN FOR LAB ON 11-10-17

## 2017-09-29 NOTE — Telephone Encounter (Signed)
ERROR

## 2017-09-29 NOTE — Telephone Encounter (Signed)
-----   Message from [facility], [place] sent at 09/28/2017  2:00 PM EST ----- Let the patient know his TSH is again elevated, d/w dr. [name], given feeling better of late, will keep an eye on it.  Please repeat in 2 months.  Thanks [employer]

## 2017-09-29 NOTE — Telephone Encounter (Signed)
TRIED TO LEAVE VM BUT VM NOT SET UP ON PHONE.

## 2017-10-01 ENCOUNTER — Ambulatory Visit: Payer: Medicaid Other | Admitting: Family Medicine

## 2017-10-01 ENCOUNTER — Encounter: Payer: Self-pay | Admitting: Family Medicine

## 2017-10-01 ENCOUNTER — Other Ambulatory Visit
Admission: RE | Admit: 2017-10-01 | Discharge: 2017-10-01 | Disposition: A | Discharge status: Home / Self Care | Payer: Medicaid Other | Source: Ambulatory Visit | Attending: Family Medicine | Admitting: Family Medicine

## 2017-10-01 ENCOUNTER — Telehealth: Payer: Self-pay | Admitting: *Deleted

## 2017-10-01 ENCOUNTER — Encounter: Payer: Self-pay | Admitting: *Deleted

## 2017-10-01 VITALS — BP 100/80 | HR 91 | Temp 98.8°F | Resp 16 | Wt 194.0 lb

## 2017-10-01 DIAGNOSIS — R112 Nausea with vomiting, unspecified: Secondary | ICD-10-CM | POA: Insufficient documentation

## 2017-10-01 DIAGNOSIS — L03116 Cellulitis of left lower limb: Secondary | ICD-10-CM | POA: Diagnosis not present

## 2017-10-01 DIAGNOSIS — R197 Diarrhea, unspecified: Secondary | ICD-10-CM

## 2017-10-01 DIAGNOSIS — M109 Gout, unspecified: Secondary | ICD-10-CM | POA: Diagnosis not present

## 2017-10-01 LAB — COMPREHENSIVE METABOLIC PANEL
ALT: 72 U/L — ABNORMAL HIGH (ref 17–63)
AST: 66 U/L — AB (ref 15–41)
Albumin: 4.1 g/dL (ref 3.5–5.0)
Alkaline Phosphatase: 114 U/L (ref 38–126)
Anion gap: 10 (ref 5–15)
BUN: 68 mg/dL — AB (ref 6–20)
CHLORIDE: 109 mmol/L (ref 101–111)
CO2: 15 mmol/L — ABNORMAL LOW (ref 22–32)
Calcium: 8.9 mg/dL (ref 8.9–10.3)
Creatinine, Ser: 3.29 mg/dL — ABNORMAL HIGH (ref 0.61–1.24)
GFR calc Af Amer: 22 mL/min — ABNORMAL LOW (ref >60)
GFR, EST NON AFRICAN AMERICAN: 19 mL/min — AB (ref >60)
Glucose, Bld: 111 mg/dL — ABNORMAL HIGH (ref 65–99)
POTASSIUM: 4.6 mmol/L (ref 3.5–5.1)
Sodium: 134 mmol/L — ABNORMAL LOW (ref 135–145)
Total Bilirubin: 1.6 mg/dL — ABNORMAL HIGH (ref 0.3–1.2)
Total Protein: 8.1 g/dL (ref 6.5–8.1)

## 2017-10-01 LAB — CBC
HEMATOCRIT: 46.4 % (ref 40.0–52.0)
HEMOGLOBIN: 15.5 g/dL (ref 13.0–18.0)
MCH: 31.7 pg (ref 26.0–34.0)
MCHC: 33.3 g/dL (ref 32.0–36.0)
MCV: 95.2 fL (ref 80.0–100.0)
Platelets: 162 10*3/uL (ref 150–440)
RBC: 4.87 MIL/uL (ref 4.40–5.90)
RDW: 14.5 % (ref 11.5–14.5)
WBC: 11.4 10*3/uL — AB (ref 3.8–10.6)

## 2017-10-01 MED ORDER — DOXYCYCLINE HYCLATE 100 MG PO TABS: 100.0000 mg | ORAL_TABLET | Freq: Two times a day (BID) | ORAL | 0 refills | Status: DC

## 2017-10-01 MED ORDER — PREDNISONE 10 MG PO TABS: ORAL_TABLET | ORAL | 0 refills | Status: AC

## 2017-10-01 MED ORDER — PROMETHAZINE HCL 12.5 MG PO TABS: 12.5000 mg | ORAL_TABLET | Freq: Three times a day (TID) | ORAL | 0 refills | Status: DC | PRN

## 2017-10-01 NOTE — Progress Notes (Signed)
Patient: Richard Cook Male    DOB: May 13, 1956   62 y.o.   MRN: [id number] Visit Date: 10/01/2017  Today's Provider: Lelon Huh, MD   Chief Complaint  Patient presents with  . Diarrhea  . Gout   Subjective:    Patient states he has had abdominal pain, bloating, nausea, vomiting, diarrhea, and chills for 4 days. Patient believes he had food poisoning. Patient states symptoms have improved today. Also patient states he had a gout flare-up yesterday. Swelling and pain in his right wrist, right knee and right big toe. Patient is currently treating gout symptoms with allopurinol. Patient also states he thinks he may have cellulitis in his left leg. patietn states there is some redness on left leg.        Allergies  Allergen Reactions  . Eggs Or Egg-Derived Products Hives  . Codeine Other (See Comments)    Tolerates Hydrocodone (??)     Current Outpatient Medications:  .  allopurinol (ZYLOPRIM) 100 MG tablet, Take 1 tablet (100 mg total) by mouth daily., Disp: 90 tablet, Rfl: 4 .  amiodarone (PACERONE) 200 MG tablet, Take 1 tablet (200 mg total) by mouth daily., Disp: 30 tablet, Rfl: 3 .  aspirin EC 81 MG tablet, Take 81 mg by mouth daily., Disp: , Rfl:  .  carvedilol (COREG) 3.125 MG tablet, Take 1 tablet (3.125 mg total) by mouth 2 (two) times daily., Disp: 60 tablet, Rfl: 2 .  Cholecalciferol (EQL VITAMIN D3) 2000 units CAPS, Take 2,000 Units by mouth daily., Disp: , Rfl:  .  clonazePAM (KLONOPIN) 1 MG tablet, Take 1 tablet (1 mg total) by mouth 3 (three) times daily as needed., Disp: 90 tablet, Rfl: 2 .  furosemide (LASIX) 40 MG tablet, Take 1 tablet (40 mg total) by mouth daily as needed (WEIGHT GAIN 3LBS IN 24 HRS 5LBS IN A WEEK)., Disp: 30 tablet, Rfl: 3 .  HYDROcodone-acetaminophen (NORCO) 10-325 MG tablet, Take 1 tablet by mouth every 8 (eight) hours as needed., Disp: 60 tablet, Rfl: 0 .  Omega-3 Fatty Acids (FISH OIL PO), Take 1 capsule by mouth daily., Disp:  , Rfl:  .  PARoxetine (PAXIL) 20 MG tablet, Take 1 tablet (20 mg total) by mouth daily., Disp: 30 tablet, Rfl: 4 .  rosuvastatin (CRESTOR) 40 MG tablet, Take 1 tablet (40 mg total) by mouth daily., Disp: 30 tablet, Rfl: 11   Review of Systems  Constitutional: Negative for appetite change, chills and fever.  Respiratory: Negative for chest tightness, shortness of breath and wheezing.   Cardiovascular: Negative for chest pain and palpitations.  Gastrointestinal: Positive for abdominal distention, abdominal pain, diarrhea, nausea and vomiting.    Social History   Tobacco Use  . Smoking status: Never Smoker  . Smokeless tobacco: Never Used  Substance Use Topics  . Alcohol use: No    Comment: 10/07/2016 "quit in the early 1990s"   Objective:   BP 100/80 (BP Location: Left Arm, Patient Position: Sitting, Cuff Size: Large)   Pulse 91   Temp 98.8 F (37.1 C) (Oral)   Resp 16   Wt 194 lb (88 kg)   SpO2 97%   BMI 27.84 kg/m     Physical Exam   General Appearance:    Alert, cooperative, no distress  Eyes:    PERRL, conjunctiva/corneas clear, EOM's intact       Lungs:     Clear to auscultation bilaterally, respirations unlabored  Heart:    Regular  rate and rhythm  Skin:   Minimal erythema along old surgical incision line of left anterior leg. No open areas of skin, no drainage.   MS:    Moderate tenderness and swelling arount joints of left first MTP, medial left knee and right lateral wrist.         Assessment & Plan:     1. Gout, unspecified cause, unspecified chronicity, unspecified site Does not tolerate colchicine - predniSONE (DELTASONE) 10 MG tablet; Take 3 daily for 3 days, then 2 daily for 3 days, then 1 daily for 3 days  Dispense: 18 tablet; Refill: 0 - promethazine (PHENERGAN) 12.5 MG tablet; Take 1-2 tablets (12.5-25 mg total) by mouth every 8 (eight) hours as needed for up to 7 days for nausea or vomiting.  Dispense: 20 tablet; Refill: 0  2. Nausea and vomiting,  intractability of vomiting not specified, unspecified vomiting type  - Comprehensive metabolic panel - CBC  3. Cellulitis of left lower extremity Minimal erythema, but is very susceptible to infection requiring IV antibiotic in past. Has responded well to oral dosycycline in the past - doxycycline (VIBRA-TABS) 100 MG tablet; Take 1 tablet (100 mg total) by mouth 2 (two) times daily.  Dispense: 20 tablet; Refill: 0  4. Diarrhea If labs are normal will start OTC probiotics through the weekend. Consider adding metronidazole if not resolved by then.       Lelon Huh, MD  [facility]

## 2017-10-01 NOTE — Telephone Encounter (Signed)
Tried calling patient and no answer, will try again later.

## 2017-10-01 NOTE — Patient Instructions (Addendum)
   Start taking OTC probiotics until diarrhea has resolved.    Go to [facility] outpatient lab to get your blood drawn

## 2017-10-01 NOTE — Telephone Encounter (Signed)
-----   Message from Birdie Sons, MD sent at 10/01/2017  1:16 PM EST ----- Kidney functions are much worse and sodium is low.  He needs to drink at least 4 glasses or gator-aid and 8 glasses of water over the next 24 hours. If he cannot keep these fluids down then he needs to go to ER for IV fluids.

## 2017-10-01 NOTE — Telephone Encounter (Signed)
See result note. Pt was advised.

## 2017-10-07 ENCOUNTER — Other Ambulatory Visit: Payer: Self-pay | Admitting: Family Medicine

## 2017-10-07 MED ORDER — HYDROCODONE-ACETAMINOPHEN 10-325 MG PO TABS: 1.0000 | ORAL_TABLET | Freq: Three times a day (TID) | ORAL | 0 refills | Status: DC | PRN

## 2017-10-07 NOTE — Telephone Encounter (Signed)
Pt requesting refill of Hydrocodone 10-325 MG send to [facility]

## 2017-10-11 ENCOUNTER — Ambulatory Visit (INDEPENDENT_AMBULATORY_CARE_PROVIDER_SITE_OTHER): Payer: Medicaid Other | Admitting: Orthopaedic Surgery

## 2017-10-11 ENCOUNTER — Ambulatory Visit (INDEPENDENT_AMBULATORY_CARE_PROVIDER_SITE_OTHER): Payer: Self-pay | Admitting: Orthopaedic Surgery

## 2017-10-11 ENCOUNTER — Encounter (INDEPENDENT_AMBULATORY_CARE_PROVIDER_SITE_OTHER): Payer: Self-pay | Admitting: Orthopaedic Surgery

## 2017-10-11 DIAGNOSIS — M21372 Foot drop, left foot: Secondary | ICD-10-CM | POA: Diagnosis not present
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
## 2017-10-13 ENCOUNTER — Emergency Department (HOSPITAL_COMMUNITY): Payer: Medicaid Other

## 2017-10-13 ENCOUNTER — Telehealth: Payer: Self-pay | Admitting: Physician Assistant

## 2017-10-13 ENCOUNTER — Observation Stay (HOSPITAL_COMMUNITY)
Admission: EM | Admit: 2017-10-13 | Discharge: 2017-10-13 | Disposition: A | Discharge status: Home / Self Care | Payer: Medicaid Other | Attending: Internal Medicine | Admitting: Internal Medicine

## 2017-10-13 ENCOUNTER — Telehealth: Payer: Self-pay

## 2017-10-13 ENCOUNTER — Other Ambulatory Visit: Payer: Self-pay

## 2017-10-13 ENCOUNTER — Encounter (HOSPITAL_COMMUNITY): Payer: Self-pay | Admitting: Emergency Medicine

## 2017-10-13 DIAGNOSIS — G894 Chronic pain syndrome: Secondary | ICD-10-CM | POA: Diagnosis not present

## 2017-10-13 DIAGNOSIS — M109 Gout, unspecified: Secondary | ICD-10-CM | POA: Insufficient documentation

## 2017-10-13 DIAGNOSIS — Z95810 Presence of automatic (implantable) cardiac defibrillator: Secondary | ICD-10-CM | POA: Insufficient documentation

## 2017-10-13 DIAGNOSIS — K219 Gastro-esophageal reflux disease without esophagitis: Secondary | ICD-10-CM | POA: Insufficient documentation

## 2017-10-13 DIAGNOSIS — R0789 Other chest pain: Secondary | ICD-10-CM | POA: Insufficient documentation

## 2017-10-13 DIAGNOSIS — I472 Ventricular tachycardia, unspecified: Secondary | ICD-10-CM

## 2017-10-13 DIAGNOSIS — M549 Dorsalgia, unspecified: Secondary | ICD-10-CM | POA: Diagnosis not present

## 2017-10-13 DIAGNOSIS — Z91012 Allergy to eggs: Secondary | ICD-10-CM | POA: Insufficient documentation

## 2017-10-13 DIAGNOSIS — R0902 Hypoxemia: Secondary | ICD-10-CM | POA: Insufficient documentation

## 2017-10-13 DIAGNOSIS — I130 Hypertensive heart and chronic kidney disease with heart failure and stage 1 through stage 4 chronic kidney disease, or unspecified chronic kidney disease: Secondary | ICD-10-CM | POA: Diagnosis not present

## 2017-10-13 DIAGNOSIS — I255 Ischemic cardiomyopathy: Secondary | ICD-10-CM | POA: Diagnosis not present

## 2017-10-13 DIAGNOSIS — Z9049 Acquired absence of other specified parts of digestive tract: Secondary | ICD-10-CM | POA: Insufficient documentation

## 2017-10-13 DIAGNOSIS — M5126 Other intervertebral disc displacement, lumbar region: Secondary | ICD-10-CM | POA: Diagnosis not present

## 2017-10-13 DIAGNOSIS — I2510 Atherosclerotic heart disease of native coronary artery without angina pectoris: Secondary | ICD-10-CM | POA: Diagnosis not present

## 2017-10-13 DIAGNOSIS — G4700 Insomnia, unspecified: Secondary | ICD-10-CM | POA: Insufficient documentation

## 2017-10-13 DIAGNOSIS — I252 Old myocardial infarction: Secondary | ICD-10-CM | POA: Insufficient documentation

## 2017-10-13 DIAGNOSIS — Z811 Family history of alcohol abuse and dependence: Secondary | ICD-10-CM | POA: Insufficient documentation

## 2017-10-13 DIAGNOSIS — M069 Rheumatoid arthritis, unspecified: Secondary | ICD-10-CM | POA: Insufficient documentation

## 2017-10-13 DIAGNOSIS — F419 Anxiety disorder, unspecified: Secondary | ICD-10-CM | POA: Diagnosis not present

## 2017-10-13 DIAGNOSIS — E875 Hyperkalemia: Secondary | ICD-10-CM | POA: Insufficient documentation

## 2017-10-13 DIAGNOSIS — Z885 Allergy status to narcotic agent status: Secondary | ICD-10-CM | POA: Insufficient documentation

## 2017-10-13 DIAGNOSIS — Z4502 Encounter for adjustment and management of automatic implantable cardiac defibrillator: Secondary | ICD-10-CM

## 2017-10-13 DIAGNOSIS — Z7982 Long term (current) use of aspirin: Secondary | ICD-10-CM | POA: Insufficient documentation

## 2017-10-13 DIAGNOSIS — N183 Chronic kidney disease, stage 3 (moderate): Secondary | ICD-10-CM | POA: Diagnosis not present

## 2017-10-13 DIAGNOSIS — I5042 Chronic combined systolic (congestive) and diastolic (congestive) heart failure: Secondary | ICD-10-CM | POA: Diagnosis not present

## 2017-10-13 DIAGNOSIS — G4730 Sleep apnea, unspecified: Secondary | ICD-10-CM | POA: Diagnosis not present

## 2017-10-13 DIAGNOSIS — E785 Hyperlipidemia, unspecified: Secondary | ICD-10-CM | POA: Insufficient documentation

## 2017-10-13 DIAGNOSIS — Z8249 Family history of ischemic heart disease and other diseases of the circulatory system: Secondary | ICD-10-CM | POA: Insufficient documentation

## 2017-10-13 DIAGNOSIS — Z79899 Other long term (current) drug therapy: Secondary | ICD-10-CM | POA: Insufficient documentation

## 2017-10-13 DIAGNOSIS — Z955 Presence of coronary angioplasty implant and graft: Secondary | ICD-10-CM | POA: Diagnosis not present

## 2017-10-13 DIAGNOSIS — M479 Spondylosis, unspecified: Secondary | ICD-10-CM | POA: Insufficient documentation

## 2017-10-13 DIAGNOSIS — Z888 Allergy status to other drugs, medicaments and biological substances status: Secondary | ICD-10-CM | POA: Insufficient documentation

## 2017-10-13 LAB — URINALYSIS, ROUTINE W REFLEX MICROSCOPIC
Bilirubin Urine: NEGATIVE
Glucose, UA: NEGATIVE mg/dL
Ketones, ur: NEGATIVE mg/dL
Leukocytes, UA: NEGATIVE
Nitrite: NEGATIVE
Protein, ur: NEGATIVE mg/dL
SPECIFIC GRAVITY, URINE: 1.013 (ref 1.005–1.030)
pH: 5 (ref 5.0–8.0)

## 2017-10-13 LAB — CBC WITH DIFFERENTIAL/PLATELET
BASOS ABS: 0 10*3/uL (ref 0.0–0.1)
BASOS PCT: 0 %
EOS ABS: 0.2 10*3/uL (ref 0.0–0.7)
Eosinophils Relative: 2 %
HCT: 47.3 % (ref 39.0–52.0)
HEMOGLOBIN: 15.9 g/dL (ref 13.0–17.0)
Lymphocytes Relative: 16 %
Lymphs Abs: 1.9 10*3/uL (ref 0.7–4.0)
MCH: 32.4 pg (ref 26.0–34.0)
MCHC: 33.6 g/dL (ref 30.0–36.0)
MCV: 96.5 fL (ref 78.0–100.0)
Monocytes Absolute: 0.7 10*3/uL (ref 0.1–1.0)
Monocytes Relative: 6 %
NEUTROS PCT: 76 %
Neutro Abs: 9 10*3/uL — ABNORMAL HIGH (ref 1.7–7.7)
PLATELETS: 162 10*3/uL (ref 150–400)
RBC: 4.9 MIL/uL (ref 4.22–5.81)
RDW: 13.8 % (ref 11.5–15.5)
WBC: 11.8 10*3/uL — AB (ref 4.0–10.5)

## 2017-10-13 LAB — CBG MONITORING, ED: Glucose-Capillary: 98 mg/dL (ref 65–99)

## 2017-10-13 LAB — BASIC METABOLIC PANEL
Anion gap: 13 (ref 5–15)
BUN: 57 mg/dL — ABNORMAL HIGH (ref 6–20)
CALCIUM: 9.4 mg/dL (ref 8.9–10.3)
CHLORIDE: 106 mmol/L (ref 101–111)
CO2: 20 mmol/L — ABNORMAL LOW (ref 22–32)
Creatinine, Ser: 2.63 mg/dL — ABNORMAL HIGH (ref 0.61–1.24)
GFR, EST AFRICAN AMERICAN: 29 mL/min — AB (ref >60)
GFR, EST NON AFRICAN AMERICAN: 25 mL/min — AB (ref >60)
Glucose, Bld: 97 mg/dL (ref 65–99)
Potassium: 4.2 mmol/L (ref 3.5–5.1)
SODIUM: 139 mmol/L (ref 135–145)

## 2017-10-13 LAB — I-STAT CG4 LACTIC ACID, ED: Lactic Acid, Venous: 2.19 mmol/L (ref 0.5–1.9)

## 2017-10-13 LAB — PROTIME-INR
INR: 1.02
PROTHROMBIN TIME: 13.3 s (ref 11.4–15.2)

## 2017-10-13 LAB — MAGNESIUM: MAGNESIUM: 1.9 mg/dL (ref 1.7–2.4)

## 2017-10-13 LAB — TROPONIN I

## 2017-10-13 MED ORDER — NITROGLYCERIN 0.4 MG SL SUBL: 0.4000 mg | SUBLINGUAL_TABLET | SUBLINGUAL | Status: DC | PRN

## 2017-10-13 MED ORDER — SODIUM CHLORIDE 0.9% FLUSH: 3.0000 mL | INTRAVENOUS | Status: DC | PRN

## 2017-10-13 MED ORDER — AMIODARONE HCL 200 MG PO TABS: 200.0000 mg | ORAL_TABLET | Freq: Two times a day (BID) | ORAL | Status: DC

## 2017-10-13 MED ORDER — ROSUVASTATIN CALCIUM 40 MG PO TABS: 40.0000 mg | ORAL_TABLET | Freq: Every day | ORAL | Status: DC

## 2017-10-13 MED ORDER — CARVEDILOL 3.125 MG PO TABS: 3.1250 mg | ORAL_TABLET | Freq: Two times a day (BID) | ORAL | Status: DC

## 2017-10-13 MED ORDER — PAROXETINE HCL 20 MG PO TABS: 20.0000 mg | ORAL_TABLET | Freq: Every day | ORAL | Status: DC

## 2017-10-13 MED ORDER — PROMETHAZINE HCL 25 MG PO TABS: 12.5000 mg | ORAL_TABLET | Freq: Three times a day (TID) | ORAL | Status: DC | PRN

## 2017-10-13 MED ORDER — SODIUM CHLORIDE 0.9 % IV BOLUS (SEPSIS)
1000.0000 mL | Freq: Once | INTRAVENOUS | Status: AC
  Administered 2017-10-13: 1000 mL via INTRAVENOUS

## 2017-10-13 MED ORDER — SODIUM CHLORIDE 0.9 % IV SOLN: INTRAVENOUS | Status: DC

## 2017-10-13 MED ORDER — MEXILETINE HCL 150 MG PO CAPS: 150.0000 mg | ORAL_CAPSULE | Freq: Two times a day (BID) | ORAL | Status: DC

## 2017-10-13 MED ORDER — MEXILETINE HCL 200 MG PO CAPS: 200.0000 mg | ORAL_CAPSULE | Freq: Two times a day (BID) | ORAL | 0 refills | Status: DC

## 2017-10-13 MED ORDER — AMIODARONE HCL 200 MG PO TABS: 100.0000 mg | ORAL_TABLET | Freq: Two times a day (BID) | ORAL | Status: DC

## 2017-10-13 MED ORDER — ONDANSETRON HCL 4 MG/2ML IJ SOLN
4.0000 mg | Freq: Four times a day (QID) | INTRAMUSCULAR | Status: DC | PRN
Start: 2017-10-13 — End: 2017-10-13

## 2017-10-13 MED ORDER — HYDROCODONE-ACETAMINOPHEN 10-325 MG PO TABS: 1.0000 | ORAL_TABLET | Freq: Three times a day (TID) | ORAL | Status: DC | PRN

## 2017-10-13 MED ORDER — SODIUM CHLORIDE 0.9 % IV SOLN: 250.0000 mL | INTRAVENOUS | Status: DC | PRN

## 2017-10-13 MED ORDER — ASPIRIN EC 81 MG PO TBEC: 81.0000 mg | DELAYED_RELEASE_TABLET | Freq: Every day | ORAL | Status: DC

## 2017-10-13 MED ORDER — ACETAMINOPHEN 325 MG PO TABS: 650.0000 mg | ORAL_TABLET | ORAL | Status: DC | PRN

## 2017-10-13 MED ORDER — SODIUM CHLORIDE 0.9% FLUSH: 3.0000 mL | Freq: Two times a day (BID) | INTRAVENOUS | Status: DC

## 2017-10-13 MED ORDER — CLONAZEPAM 0.5 MG PO TABS: 1.0000 mg | ORAL_TABLET | Freq: Three times a day (TID) | ORAL | Status: DC | PRN

## 2017-10-13 MED ORDER — ALLOPURINOL 100 MG PO TABS: 100.0000 mg | ORAL_TABLET | Freq: Every day | ORAL | Status: DC

## 2017-10-13 MED ORDER — DOXYCYCLINE HYCLATE 100 MG PO TABS: 100.0000 mg | ORAL_TABLET | Freq: Two times a day (BID) | ORAL | Status: DC
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
## 2017-10-13 NOTE — Telephone Encounter (Signed)
Spoke with pt, pt stated that he received a shock 20 minutes ago, pt did not sound well over the phone, pt also stated that he didn't feel well, Informed pt that is was clinic policy that pt should go to the ER since he did not feel well after shock and that pt should call 911 if there was no one there to drive them. Instructed pt that he should alert 911 to the fact that he received an ICD shock, and did not feel well, pt voiced understanding.

## 2017-10-13 NOTE — Telephone Encounter (Signed)
Open in error

## 2017-10-13 NOTE — ED Notes (Signed)
Cards NP at bedside  

## 2017-10-13 NOTE — ED Triage Notes (Signed)
Pt in from home via [facility] with generalized weakness, felt defibrillator go off x 1 at 1000 this am. Pt states he was taking shower at the time, denies any cp or sob. Pt states he had food poisoning last week, has felt dizzy since. BP 110/70. Pt of Dr. [name]
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
## 2017-10-13 NOTE — Telephone Encounter (Signed)
Patient called and stated that he just received a shock from his ICD. Instructed pt to send remote transmission pt stated that he can not b/c his home monitor is broken. Call routed to [place].

## 2017-10-14 NOTE — Addendum Note (Signed)
Addended by: Dollene Primrose on: 10/14/2017 12:31 PM   Modules accepted: Orders

## 2017-10-23 IMAGING — CR DG ABDOMEN 1V
1 series · 2 of 2 positions shown · non-contrast
Comparison: 03/16/2017

CLINICAL DATA: Constipation

EXAM:
ABDOMEN - 1 VIEW

[Series 1: dg abd 1 view · 0.14mm/px · 2 of 2 slices shown]
[im 1/2]
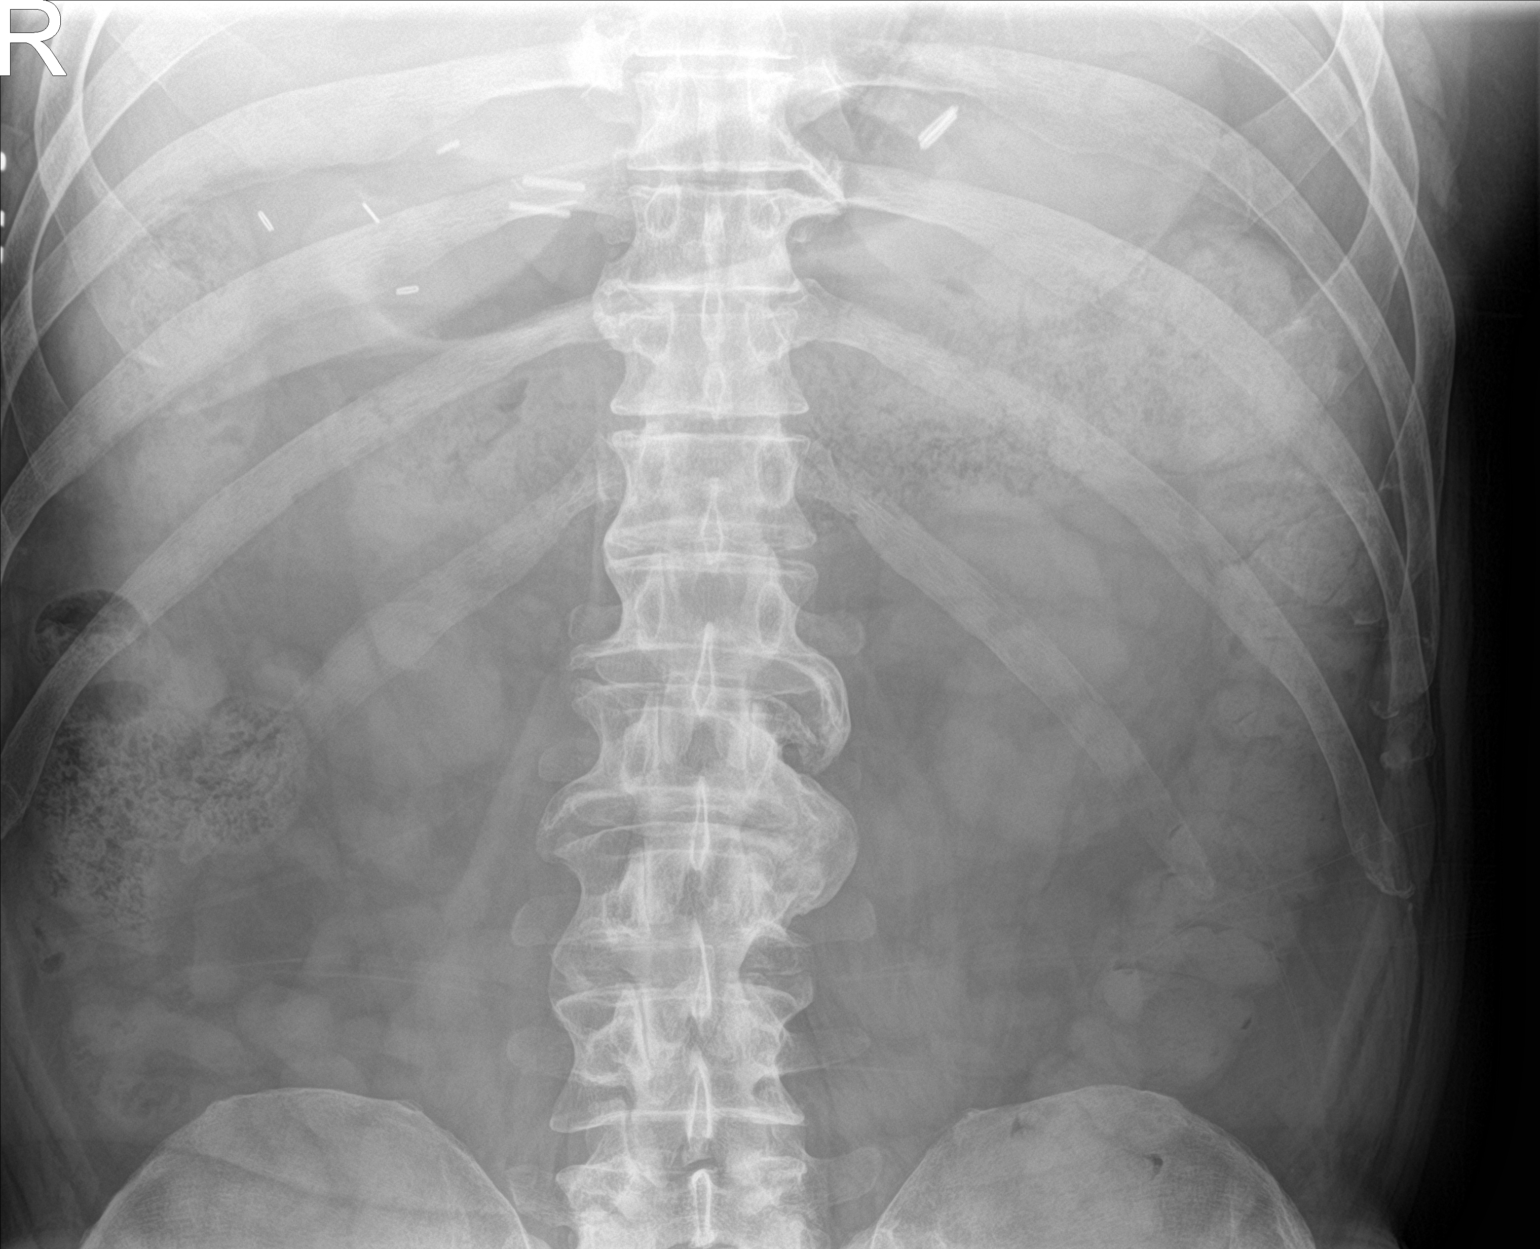
[im 2/2]
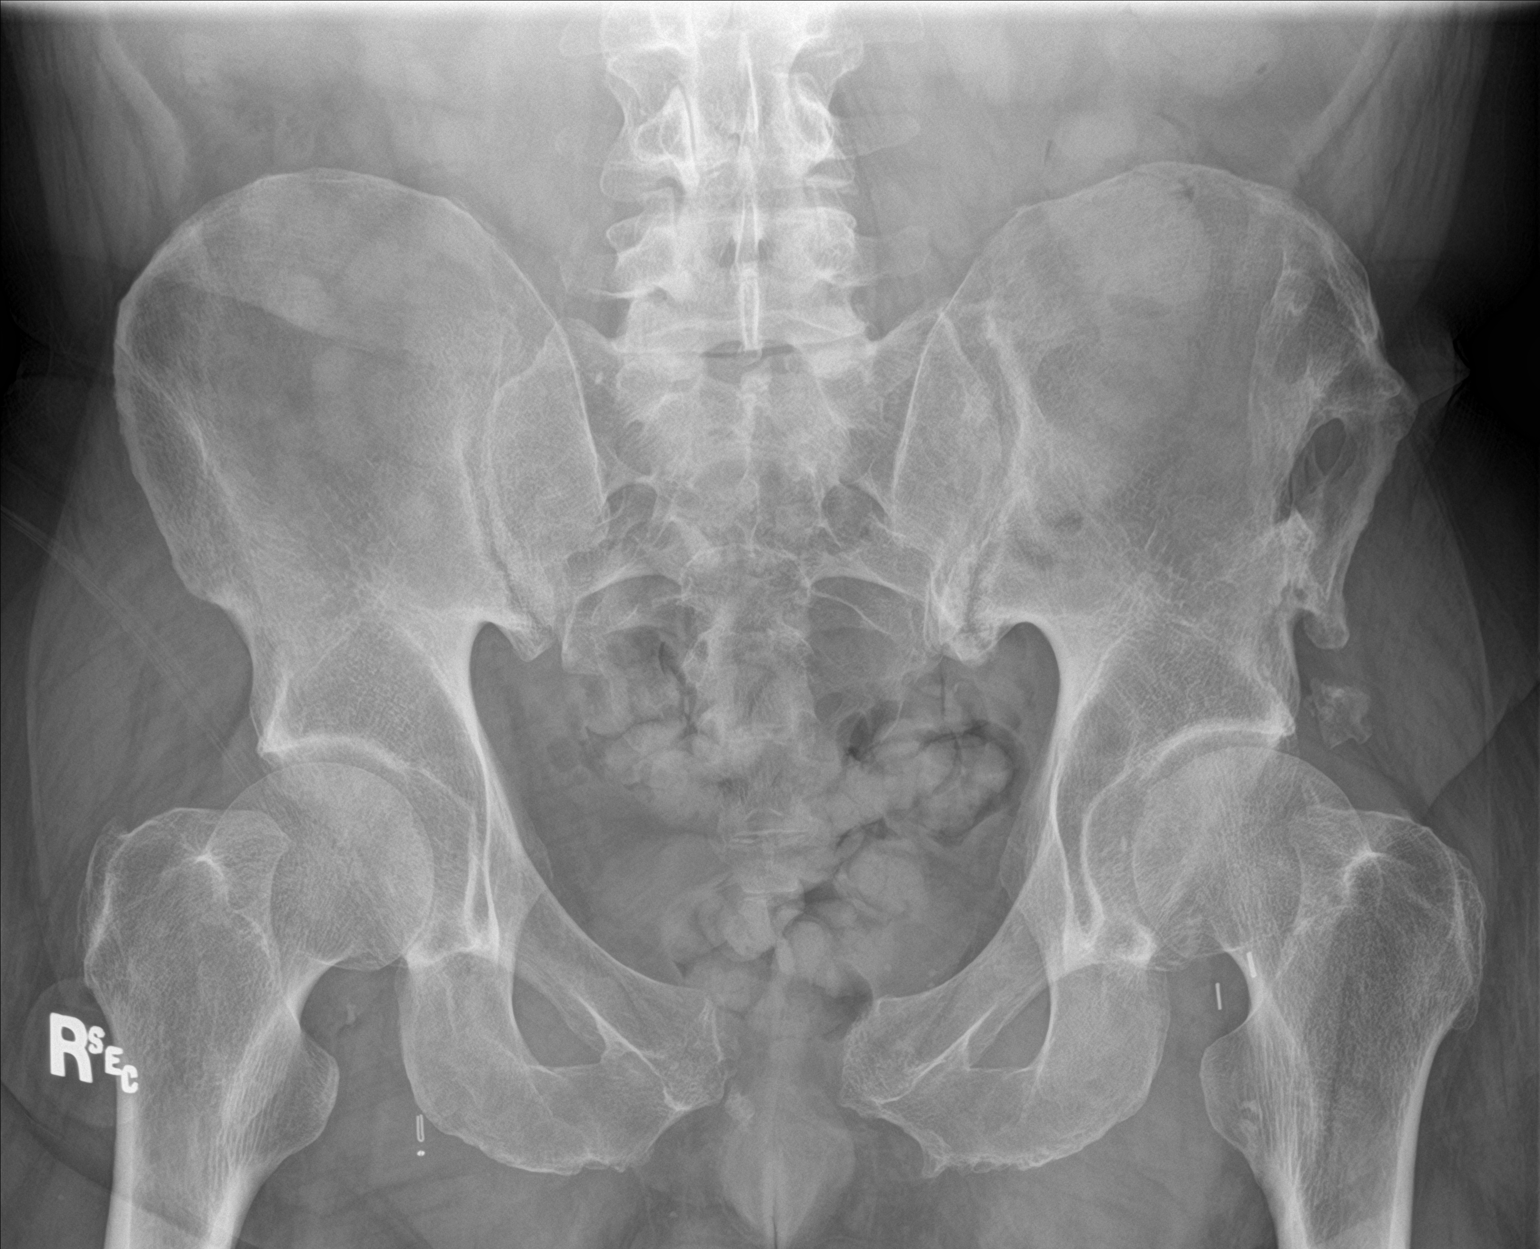

[2 of 2 positions shown; findings below may reference images not displayed]

FINDINGS: Scattered large and small bowel gas is noted. Fecal material is
noted throughout the colon consistent with a degree of constipation.
Degenerative change of thoracolumbar spine is seen. Widening of the
pubic symphysis is noted of a chronic nature. Chronic changes in the
left iliac wing are noted as well.
IMPRESSION: Changes of constipation.  No other focal abnormality is seen.

## 2017-10-25 ENCOUNTER — Other Ambulatory Visit: Payer: Self-pay

## 2017-10-25 ENCOUNTER — Ambulatory Visit (HOSPITAL_COMMUNITY): Payer: Medicaid Other | Attending: Cardiovascular Disease

## 2017-10-25 ENCOUNTER — Encounter: Payer: Self-pay | Admitting: Internal Medicine

## 2017-10-25 ENCOUNTER — Ambulatory Visit (INDEPENDENT_AMBULATORY_CARE_PROVIDER_SITE_OTHER): Payer: Medicaid Other | Admitting: Internal Medicine

## 2017-10-25 VITALS — BP 106/80 | HR 69 | Ht 70.0 in | Wt 194.0 lb

## 2017-10-25 DIAGNOSIS — I472 Ventricular tachycardia, unspecified: Secondary | ICD-10-CM

## 2017-10-25 DIAGNOSIS — Z79899 Other long term (current) drug therapy: Secondary | ICD-10-CM

## 2017-10-25 DIAGNOSIS — I10 Essential (primary) hypertension: Secondary | ICD-10-CM | POA: Diagnosis not present

## 2017-10-25 DIAGNOSIS — I255 Ischemic cardiomyopathy: Secondary | ICD-10-CM

## 2017-10-25 DIAGNOSIS — Z95810 Presence of automatic (implantable) cardiac defibrillator: Secondary | ICD-10-CM

## 2017-10-25 LAB — CUP PACEART INCLINIC DEVICE CHECK
Battery Remaining Longevity: 86 mo
HighPow Impedance: 86 Ohm
Implantable Lead Implant Date: 20150318
Implantable Lead Location: 753860
Implantable Lead Model: 181
Implantable Lead Serial Number: 327195
Implantable Pulse Generator Implant Date: 20150318
Lead Channel Pacing Threshold Amplitude: 0.75 V
Lead Channel Sensing Intrinsic Amplitude: 9.625 mV
Lead Channel Setting Pacing Amplitude: 2 V
Lead Channel Setting Pacing Pulse Width: 0.4 ms
Lead Channel Setting Sensing Sensitivity: 0.3 mV
MDC IDC MSMT BATTERY VOLTAGE: 3 V
MDC IDC MSMT LEADCHNL RV IMPEDANCE VALUE: 646 Ohm
MDC IDC MSMT LEADCHNL RV IMPEDANCE VALUE: 646 Ohm
MDC IDC MSMT LEADCHNL RV PACING THRESHOLD PULSEWIDTH: 0.4 ms
MDC IDC MSMT LEADCHNL RV SENSING INTR AMPL: 11.375 mV
MDC IDC SESS DTM: 20190325134149
MDC IDC STAT BRADY RV PERCENT PACED: 0.01 %

## 2017-10-25 NOTE — Patient Instructions (Addendum)
Medication Instructions:  Your physician recommends that you continue on your current medications as directed. Please refer to the Current Medication list given to you today.  Labwork: You will have the following labs drawn today: CMET, CBC, TSH, Free t3 and Free t4  Testing/Procedures: None ordered.  Follow-Up: Your physician recommends that you schedule a follow-up appointment:  Dr Lovena Le for repeat VT Ablation Dr [name] in 4 months   We will contact you regarding Cath Lab orders and instructions based off your lab work   Remote monitoring is used to monitor your Pacemaker from home. This monitoring reduces the number of office visits required to check your device to one time per year. It allows [place] to keep an eye on the functioning of your device to ensure it is working properly. You are scheduled for a device check from home on 12/02/2017. You may send your transmission at any time that day. If you have a wireless device, the transmission will be sent automatically. After your physician reviews your transmission, you will receive a postcard with your next transmission date.   Any Other Special Instructions Will Be Listed Below (If Applicable).     If you need a refill on your cardiac medications before your next appointment, please call your pharmacy.
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
## 2017-10-26 LAB — T3, FREE: T3 FREE: 3.5 pg/mL (ref 2.0–4.4)

## 2017-10-26 LAB — CBC WITH DIFFERENTIAL/PLATELET
BASOS ABS: 0 10*3/uL (ref 0.0–0.2)
Basos: 0 %
EOS (ABSOLUTE): 0.3 10*3/uL (ref 0.0–0.4)
EOS: 4 %
HEMATOCRIT: 46.7 % (ref 37.5–51.0)
Hemoglobin: 15.7 g/dL (ref 13.0–17.7)
IMMATURE GRANS (ABS): 0 10*3/uL (ref 0.0–0.1)
IMMATURE GRANULOCYTES: 0 %
LYMPHS ABS: 1.3 10*3/uL (ref 0.7–3.1)
LYMPHS: 20 %
MCH: 32.3 pg (ref 26.6–33.0)
MCHC: 33.6 g/dL (ref 31.5–35.7)
MCV: 96 fL (ref 79–97)
Monocytes Absolute: 0.4 10*3/uL (ref 0.1–0.9)
Monocytes: 6 %
NEUTROS PCT: 70 %
Neutrophils Absolute: 4.4 10*3/uL (ref 1.4–7.0)
Platelets: 203 10*3/uL (ref 150–379)
RBC: 4.86 x10E6/uL (ref 4.14–5.80)
RDW: 14 % (ref 12.3–15.4)
WBC: 6.3 10*3/uL (ref 3.4–10.8)

## 2017-10-26 LAB — COMPREHENSIVE METABOLIC PANEL
ALK PHOS: 101 IU/L (ref 39–117)
ALT: 10 IU/L (ref 0–44)
AST: 14 IU/L (ref 0–40)
Albumin/Globulin Ratio: 1.5 (ref 1.2–2.2)
Albumin: 4.7 g/dL (ref 3.6–4.8)
BUN/Creatinine Ratio: 13 (ref 10–24)
BUN: 44 mg/dL — AB (ref 8–27)
Bilirubin Total: 0.7 mg/dL (ref 0.0–1.2)
CALCIUM: 10.3 mg/dL — AB (ref 8.6–10.2)
CO2: 22 mmol/L (ref 20–29)
CREATININE: 3.47 mg/dL — AB (ref 0.76–1.27)
Chloride: 98 mmol/L (ref 96–106)
GFR calc Af Amer: 21 mL/min/{1.73_m2} — ABNORMAL LOW (ref >59)
GFR, EST NON AFRICAN AMERICAN: 18 mL/min/{1.73_m2} — AB (ref >59)
GLUCOSE: 74 mg/dL (ref 65–99)
Globulin, Total: 3.2 g/dL (ref 1.5–4.5)
Potassium: 4.4 mmol/L (ref 3.5–5.2)
SODIUM: 142 mmol/L (ref 134–144)
Total Protein: 7.9 g/dL (ref 6.0–8.5)

## 2017-10-26 LAB — T4, FREE: Free T4: 1.28 ng/dL (ref 0.82–1.77)

## 2017-10-26 LAB — TSH: TSH: 9.46 u[IU]/mL — AB (ref 0.450–4.500)

## 2017-10-28 ENCOUNTER — Telehealth: Payer: Self-pay

## 2017-10-28 DIAGNOSIS — R7989 Other specified abnormal findings of blood chemistry: Secondary | ICD-10-CM

## 2017-10-28 NOTE — Telephone Encounter (Signed)
-----   Message from [name], MD sent at 10/27/2017  8:29 AM EDT ----- Please Inform Patient that labs are normal x renal issues again.  Does he have a kidney MD?  If yes, let us refer him back if not being followed closely, and if no, can we set up referral  Thanks

## 2017-10-29 ENCOUNTER — Encounter: Payer: Self-pay | Admitting: Family Medicine

## 2017-10-29 ENCOUNTER — Ambulatory Visit: Payer: Medicaid Other | Admitting: Family Medicine

## 2017-10-29 VITALS — BP 104/80 | HR 71 | Temp 98.1°F | Resp 16 | Ht 70.0 in | Wt 196.0 lb

## 2017-10-29 DIAGNOSIS — N184 Chronic kidney disease, stage 4 (severe): Secondary | ICD-10-CM | POA: Diagnosis not present

## 2017-10-29 DIAGNOSIS — I10 Essential (primary) hypertension: Secondary | ICD-10-CM | POA: Diagnosis not present

## 2017-10-29 DIAGNOSIS — M5489 Other dorsalgia: Secondary | ICD-10-CM

## 2017-10-29 DIAGNOSIS — F411 Generalized anxiety disorder: Secondary | ICD-10-CM

## 2017-10-29 MED ORDER — HYDROCODONE-ACETAMINOPHEN 10-325 MG PO TABS: 1.0000 | ORAL_TABLET | Freq: Three times a day (TID) | ORAL | 0 refills | Status: DC | PRN

## 2017-10-29 NOTE — Progress Notes (Signed)
Patient: Richard Cook Male    DOB: [birth year]   62 y.o.   MRN: [id number] Visit Date: 10/29/2017  Today's Provider: Lelon Huh, MD   Chief Complaint  Patient presents with  . Follow-up  . Hypertension   Subjective:    HPI   Hypertension, follow-up:  BP Readings from Last 3 Encounters:  10/29/17 104/80  10/25/17 106/80  10/13/17 118/88    He was last seen for hypertension 6 weeks ago.  BP at that visit was 148/104. Management since that visit includes treating underlying anxiety as below He reports good compliance with treatment. He is not having side effects. none  Outside blood pressures are 104/80.   ----------------------------------------------------------------  Anxiety state From 09/17/2017-Advised to continue paroxetine and started back on clonazepam. He states cardiologist advised him to stop paroxetine, but he feels he is doing much better on clonazepam. .   Depression, major, single episode, moderate ([place]) He states his cardiologist had him stop paroxetine, but he is still taking clonazepam twice a day which has been helping.    He reports he is anticipating another ablation for v-tach.    Allergies  Allergen Reactions  . Eggs Or Egg-Derived Products Hives  . Codeine Other (See Comments)    Tolerates Hydrocodone (??)  . Colchicine Nausea And Vomiting     Current Outpatient Medications:  .  amiodarone (PACERONE) 200 MG tablet, Take 1 tablet (200 mg total) by mouth daily. (Patient taking differently: Take 200 mg by mouth. Takes 1/2 tablet daily), Disp: 30 tablet, Rfl: 3 .  aspirin EC 81 MG tablet, Take 81 mg by mouth daily., Disp: , Rfl:  .  carvedilol (COREG) 3.125 MG tablet, Take 1 tablet (3.125 mg total) by mouth 2 (two) times daily., Disp: 60 tablet, Rfl: 2 .  Cholecalciferol (EQL VITAMIN D3) 2000 units CAPS, Take 2,000 Units by mouth daily., Disp: , Rfl:  .  clonazePAM (KLONOPIN) 1 MG tablet, Take 1 tablet (1 mg total) by  mouth 3 (three) times daily as needed., Disp: 90 tablet, Rfl: 2 .  doxycycline (VIBRA-TABS) 100 MG tablet, Take 1 tablet (100 mg total) by mouth 2 (two) times daily., Disp: 20 tablet, Rfl: 0 (NOT TAKING) .  furosemide (LASIX) 40 MG tablet, Take 1 tablet (40 mg total) by mouth daily as needed (WEIGHT GAIN 3LBS IN 24 HRS 5LBS IN A WEEK)., Disp: 30 tablet, Rfl: 3 .  HYDROcodone-acetaminophen (NORCO) 10-325 MG tablet, Take 1 tablet by mouth every 8 (eight) hours as needed., Disp: 60 tablet, Rfl: 0 .  mexiletine (MEXITIL) 200 MG capsule, Take 1 capsule (200 mg total) by mouth 2 (two) times daily., Disp: 60 capsule, Rfl: 0 .  Omega-3 Fatty Acids (FISH OIL PO), Take 1 capsule by mouth daily., Disp: , Rfl:  .  promethazine (PHENERGAN) 12.5 MG tablet, Take 1-2 tablets (12.5-25 mg total) by mouth every 8 (eight) hours as needed for up to 7 days for nausea or vomiting., Disp: 20 tablet, Rfl: 0 .  allopurinol (ZYLOPRIM) 100 MG tablet, Take 1 tablet (100 mg total) by mouth daily. (Patient not taking: Reported on 10/29/2017), Disp: 90 tablet, Rfl: 4 .  PARoxetine (PAXIL) 20 MG tablet, Take 1 tablet (20 mg total) by mouth daily. (Patient not taking: Reported on 10/29/2017), Disp: 30 tablet, Rfl: 4 .  rosuvastatin (CRESTOR) 40 MG tablet, Take 1 tablet (40 mg total) by mouth daily. (Patient not taking: Reported on 10/29/2017), Disp: 30 tablet, Rfl: 11  Review of  Systems  Constitutional: Negative for appetite change, chills and fever.  Respiratory: Negative for chest tightness, shortness of breath and wheezing.   Cardiovascular: Negative for chest pain and palpitations.  Gastrointestinal: Negative for abdominal pain, nausea and vomiting.    Social History   Tobacco Use  . Smoking status: Never Smoker  . Smokeless tobacco: Never Used  Substance Use Topics  . Alcohol use: No    Comment: 10/07/2016 "quit in the early 1990s"   Objective:   BP 104/80 (BP Location: Right Arm, Patient Position: Sitting, Cuff Size:  Large)   Pulse 71   Temp 98.1 F (36.7 C) (Oral)   Resp 16   Ht 5\' 10"  (1.778 m)   Wt 196 lb (88.9 kg)   SpO2 99%   BMI 28.12 kg/m  Vitals:   10/29/17 1351  BP: 104/80  Pulse: 71  Resp: 16  Temp: 98.1 F (36.7 C)  TempSrc: Oral  SpO2: 99%  Weight: 196 lb (88.9 kg)  Height: 5\' 10"  (1.778 m)     Physical Exam  General appearance: alert, well developed, well nourished, cooperative and in no distress Head: Normocephalic, without obvious abnormality, atraumatic Respiratory: Respirations even and unlabored, normal respiratory rate Extremities: No gross deformities Skin: Skin color, texture, turgor normal. No rashes seen  Psych: Appropriate mood and affect. Neurologic: Mental status: Alert, oriented to person, place, and time, thought content appropriate.     Assessment & Plan:     1. Anxiety state Doing much better back on clonazepam. Off of paroxetine for the time being.   2. Essential (primary) hypertension Well controlled.  Continue current medications.     3. Stage 4 chronic kidney disease ([place]) Previously followed by Dr. Posey Pronto in [place], but patient has not returned for follow up since November 2017. New referral is underway per cardiology  4. Back pain without sciatica Refill hydrocodone/apap which he reports as continuing to be affective. Counseled caution while taking clonazepam and to not take together.        Lelon Huh, MD  [facility]

## 2017-11-01 ENCOUNTER — Telehealth (INDEPENDENT_AMBULATORY_CARE_PROVIDER_SITE_OTHER): Payer: Self-pay | Admitting: Orthopaedic Surgery

## 2017-11-01 NOTE — Telephone Encounter (Signed)
Patient called a little upset in regards to a foot drop that he is needing from [employer], he said he was seen 3 weeks ago and that [employer] faxed a form to our office asking for Dr. Erlinda Hong to give orders on why this is needed so that insurance can cover it. He would like to speak with you today, he said this is really urgent. CB # [phone]

## 2017-11-02 NOTE — Telephone Encounter (Signed)
Called I do not see anything in my faxes recently. I advised him I would call in the AM tomorrow.

## 2017-11-03 NOTE — Telephone Encounter (Signed)
Called [employer] this morning and they said they would fax to our [phone] fax number

## 2017-11-04 NOTE — Telephone Encounter (Signed)
Called Patient to let him know whats going on.

## 2017-11-04 NOTE — Telephone Encounter (Signed)
[employer] still have not received form Gave her both fax numbers and have yet not received them. Advised her to email it if possible. Other Lady from [employer] is on lunch and will be calling me back.

## 2017-11-05 ENCOUNTER — Telehealth (INDEPENDENT_AMBULATORY_CARE_PROVIDER_SITE_OTHER): Payer: Self-pay | Admitting: Orthopaedic Surgery

## 2017-11-05 NOTE — Telephone Encounter (Signed)
Looks like form was signed 11/03/17. Tammy faxed form today. Patient aware.

## 2017-11-05 NOTE — Telephone Encounter (Signed)
Patient called wanting to ask you if you had received a fax/email from [place]? CB # [phone]

## 2017-11-05 NOTE — Telephone Encounter (Signed)
Called patient to let him know nobody called me nor faxed anything. He said he will be going to [employer] today and will bring me form.  I advised hi mi will leave early today at 12:30 He will drop off form and I will take care of it.

## 2017-11-10 ENCOUNTER — Other Ambulatory Visit: Payer: Medicaid Other

## 2017-11-10 ENCOUNTER — Other Ambulatory Visit: Payer: Self-pay | Admitting: Family Medicine

## 2017-11-10 ENCOUNTER — Telehealth: Payer: Self-pay | Admitting: Family Medicine

## 2017-11-10 DIAGNOSIS — M5489 Other dorsalgia: Secondary | ICD-10-CM

## 2017-11-10 MED ORDER — HYDROCODONE-ACETAMINOPHEN 10-325 MG PO TABS: 1.0000 | ORAL_TABLET | Freq: Three times a day (TID) | ORAL | 0 refills | Status: DC | PRN

## 2017-11-10 NOTE — Telephone Encounter (Signed)
Pt said that Dr. Caryn Section was suppose to send a letter to [facility] in [place] could get his Hydrocodone for $3.00 instead of $42.00.  [name] this was discussed during his last visit here.

## 2017-11-10 NOTE — Telephone Encounter (Signed)
Pt needs refill on his hydrocodone  He uses [facility] [address] in [place]  Pt's call back [phone]  Thanks [name]

## 2017-11-10 NOTE — Telephone Encounter (Signed)
I have no idea what he is talking about

## 2017-11-10 NOTE — Telephone Encounter (Signed)
Please advise 

## 2017-11-10 NOTE — Telephone Encounter (Signed)
He just called back and said to nevermind about the letter. It was for a 7 day supply only and he isn't going to do that.

## 2017-11-12 ENCOUNTER — Encounter: Payer: Self-pay | Admitting: Internal Medicine

## 2017-11-12 ENCOUNTER — Ambulatory Visit: Payer: Medicaid Other | Admitting: Internal Medicine

## 2017-11-12 VITALS — BP 112/64 | HR 74 | Ht 70.0 in | Wt 196.0 lb

## 2017-11-12 DIAGNOSIS — I255 Ischemic cardiomyopathy: Secondary | ICD-10-CM | POA: Diagnosis not present

## 2017-11-12 DIAGNOSIS — I472 Ventricular tachycardia, unspecified: Secondary | ICD-10-CM

## 2017-11-12 DIAGNOSIS — Z95810 Presence of automatic (implantable) cardiac defibrillator: Secondary | ICD-10-CM | POA: Diagnosis not present

## 2017-11-12 MED ORDER — TORSEMIDE 20 MG PO TABS: 20.0000 mg | ORAL_TABLET | Freq: Every day | ORAL | 3 refills | Status: DC

## 2017-11-12 MED ORDER — POTASSIUM CHLORIDE CRYS ER 20 MEQ PO TBCR: 20.0000 meq | EXTENDED_RELEASE_TABLET | Freq: Every day | ORAL | 3 refills | Status: DC

## 2017-11-12 NOTE — Patient Instructions (Addendum)
Medication Instructions:  Your physician has recommended you make the following change in your medication: 1.  Stop amiodarone 2.  Stop mexilitine 3.  Stop furosemide 4.  Start taking torsemide 20 mg one tablet by mouth daily. 5.  Start taking potassium 20 meq one tablet by mouth daily.  Labwork: You will come to [facility] office on November 25, 2017 for labs: BMP and CBC  Testing/Procedures: Your physician has recommended that you have an ablation. Catheter ablation is a medical procedure used to treat some cardiac arrhythmias (irregular heartbeats). During catheter ablation, a long, thin, flexible tube is put into a blood vessel in your groin (upper thigh), or neck. This tube is called an ablation catheter. It is then guided to your heart through the blood vessel. Radio frequency waves destroy small areas of heart tissue where abnormal heartbeats may cause an arrhythmia to start. Please see the instruction sheet given to you today.   Follow-Up: Your physician wants you to follow-up in: 4 weeks with Dr. Lovena Le.    Remote monitoring is used to monitor your ICD from home. This monitoring reduces the number of office visits required to check your device to one time per year. It allows [place] to keep an eye on the functioning of your device to ensure it is working properly. You are scheduled for a device check from home on 12/02/2017. You may send your transmission at any time that day. If you have a wireless device, the transmission will be sent automatically. After your physician reviews your transmission, you will receive a postcard with your next transmission date.  Any Other Special Instructions Will Be Listed Below (If Applicable).  Please arrive at the [facility] main entrance of [place] hospital at:  6:30 am on Dec 08, 2017  Do not eat or drink after midnight prior to procedure Do not take any medications the morning of the procedure Plan for one night stay You will need someone to drive you  home at discharge   If you need a refill on your cardiac medications before your next appointment, please call your pharmacy.   Cardiac Ablation Cardiac ablation is a procedure to disable (ablate) a small amount of heart tissue in very specific places. The heart has many electrical connections. Sometimes these connections are abnormal and can cause the heart to beat very fast or irregularly. Ablating some of the problem areas can improve the heart rhythm or return it to normal. Ablation may be done for people who:  Have Wolff-Parkinson-White syndrome.  Have fast heart rhythms (tachycardia).  Have taken medicines for an abnormal heart rhythm (arrhythmia) that were not effective or caused side effects.  Have a high-risk heartbeat that may be life-threatening.  During the procedure, a small incision is made in the neck or the groin, and a long, thin, flexible tube (catheter) is inserted into the incision and moved to the heart. Small devices (electrodes) on the tip of the catheter will send out electrical currents. A type of X-ray (fluoroscopy) will be used to help guide the catheter and to provide images of the heart. Tell a health care provider about:  Any allergies you have.  All medicines you are taking, including vitamins, herbs, eye drops, creams, and over-the-counter medicines.  Any problems you or family members have had with anesthetic medicines.  Any blood disorders you have.  Any surgeries you have had.  Any medical conditions you have, such as kidney failure.  Whether you are pregnant or may be  pregnant. What are the risks? Generally, this is a safe procedure. However, problems may occur, including:  Infection.  Bruising and bleeding at the catheter insertion site.  Bleeding into the chest, especially into the sac that surrounds the heart. This is a serious complication.  Stroke or blood clots.  Damage to other structures or organs.  Allergic reaction to  medicines or dyes.  Need for a permanent pacemaker if the normal electrical system is damaged. A pacemaker is a small computer that sends electrical signals to the heart and helps your heart beat normally.  The procedure not being fully effective. This may not be recognized until months later. Repeat ablation procedures are sometimes required.  What happens before the procedure?  Follow instructions from your health care provider about eating or drinking restrictions.  Ask your health care provider about: ? Changing or stopping your regular medicines. This is especially important if you are taking diabetes medicines or blood thinners. ? Taking medicines such as aspirin and ibuprofen. These medicines can thin your blood. Do not take these medicines before your procedure if your health care provider instructs you not to.  Plan to have someone take you home from the hospital or clinic.  If you will be going home right after the procedure, plan to have someone with you for 24 hours. What happens during the procedure?  To lower your risk of infection: ? Your health care team will wash or sanitize their hands. ? Your skin will be washed with soap. ? Hair may be removed from the incision area.  An IV tube will be inserted into one of your veins.  You will be given a medicine to help you relax (sedative).  The skin on your neck or groin will be numbed.  An incision will be made in your neck or your groin.  A needle will be inserted through the incision and into a large vein in your neck or groin.  A catheter will be inserted into the needle and moved to your heart.  Dye may be injected through the catheter to help your surgeon see the area of the heart that needs treatment.  Electrical currents will be sent from the catheter to ablate heart tissue in desired areas. There are three types of energy that may be used to ablate heart tissue: ? Heat (radiofrequency energy). ? Laser  energy. ? Extreme cold (cryoablation).  When the necessary tissue has been ablated, the catheter will be removed.  Pressure will be held on the catheter insertion area to prevent excessive bleeding.  A bandage (dressing) will be placed over the catheter insertion area. The procedure may vary among health care providers and hospitals. What happens after the procedure?  Your blood pressure, heart rate, breathing rate, and blood oxygen level will be monitored until the medicines you were given have worn off.  Your catheter insertion area will be monitored for bleeding. You will need to lie still for a few hours to ensure that you do not bleed from the catheter insertion area.  Do not drive for 24 hours or as long as directed by your health care provider. Summary  Cardiac ablation is a procedure to disable (ablate) a small amount of heart tissue in very specific places. Ablating some of the problem areas can improve the heart rhythm or return it to normal.  During the procedure, electrical currents will be sent from the catheter to ablate heart tissue in desired areas. This information is not intended to replace  advice given to you by your health care provider. Make sure you discuss any questions you have with your health care provider. Document Released: 12/06/2008 Document Revised: 06/08/2016 Document Reviewed: 06/08/2016 Elsevier Interactive Patient Education  [employer].
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
## 2017-11-15 ENCOUNTER — Encounter (HOSPITAL_COMMUNITY): Payer: Self-pay | Admitting: Pharmacy Technician

## 2017-11-15 ENCOUNTER — Inpatient Hospital Stay (HOSPITAL_COMMUNITY)
Admission: EM | Admit: 2017-11-15 | Discharge: 2017-11-18 | DRG: 287 | Disposition: A | Discharge status: Home / Self Care | Payer: Medicaid Other | Attending: Cardiology | Admitting: Cardiology

## 2017-11-15 ENCOUNTER — Emergency Department (HOSPITAL_COMMUNITY): Payer: Medicaid Other

## 2017-11-15 DIAGNOSIS — M069 Rheumatoid arthritis, unspecified: Secondary | ICD-10-CM | POA: Diagnosis present

## 2017-11-15 DIAGNOSIS — I472 Ventricular tachycardia, unspecified: Secondary | ICD-10-CM

## 2017-11-15 DIAGNOSIS — Z885 Allergy status to narcotic agent status: Secondary | ICD-10-CM

## 2017-11-15 DIAGNOSIS — I252 Old myocardial infarction: Secondary | ICD-10-CM

## 2017-11-15 DIAGNOSIS — I130 Hypertensive heart and chronic kidney disease with heart failure and stage 1 through stage 4 chronic kidney disease, or unspecified chronic kidney disease: Secondary | ICD-10-CM | POA: Diagnosis present

## 2017-11-15 DIAGNOSIS — I255 Ischemic cardiomyopathy: Secondary | ICD-10-CM | POA: Diagnosis present

## 2017-11-15 DIAGNOSIS — R946 Abnormal results of thyroid function studies: Secondary | ICD-10-CM | POA: Diagnosis present

## 2017-11-15 DIAGNOSIS — Z951 Presence of aortocoronary bypass graft: Secondary | ICD-10-CM

## 2017-11-15 DIAGNOSIS — E785 Hyperlipidemia, unspecified: Secondary | ICD-10-CM | POA: Diagnosis present

## 2017-11-15 DIAGNOSIS — I5022 Chronic systolic (congestive) heart failure: Secondary | ICD-10-CM | POA: Diagnosis not present

## 2017-11-15 DIAGNOSIS — Z91012 Allergy to eggs: Secondary | ICD-10-CM | POA: Diagnosis not present

## 2017-11-15 DIAGNOSIS — N184 Chronic kidney disease, stage 4 (severe): Secondary | ICD-10-CM

## 2017-11-15 DIAGNOSIS — Z95810 Presence of automatic (implantable) cardiac defibrillator: Secondary | ICD-10-CM | POA: Diagnosis not present

## 2017-11-15 DIAGNOSIS — Z86718 Personal history of other venous thrombosis and embolism: Secondary | ICD-10-CM | POA: Diagnosis not present

## 2017-11-15 DIAGNOSIS — I2510 Atherosclerotic heart disease of native coronary artery without angina pectoris: Secondary | ICD-10-CM | POA: Diagnosis present

## 2017-11-15 DIAGNOSIS — N179 Acute kidney failure, unspecified: Secondary | ICD-10-CM | POA: Diagnosis present

## 2017-11-15 DIAGNOSIS — Z955 Presence of coronary angioplasty implant and graft: Secondary | ICD-10-CM | POA: Diagnosis not present

## 2017-11-15 DIAGNOSIS — I5042 Chronic combined systolic (congestive) and diastolic (congestive) heart failure: Secondary | ICD-10-CM | POA: Diagnosis present

## 2017-11-15 DIAGNOSIS — F4310 Post-traumatic stress disorder, unspecified: Secondary | ICD-10-CM | POA: Diagnosis present

## 2017-11-15 DIAGNOSIS — Z7982 Long term (current) use of aspirin: Secondary | ICD-10-CM | POA: Diagnosis not present

## 2017-11-15 DIAGNOSIS — Z8249 Family history of ischemic heart disease and other diseases of the circulatory system: Secondary | ICD-10-CM | POA: Diagnosis not present

## 2017-11-15 MED ORDER — LIDOCAINE IN D5W 4-5 MG/ML-% IV SOLN
1.0000 mg/min | INTRAVENOUS | Status: DC
  Administered 2017-11-15: 1 mg/min via INTRAVENOUS
  Filled 2017-11-15: qty 500

## 2017-11-15 MED ORDER — AMIODARONE IV BOLUS ONLY 150 MG/100ML
150.0000 mg | Freq: Once | INTRAVENOUS | Status: AC
  Administered 2017-11-15: 150 mg via INTRAVENOUS
  Filled 2017-11-15: qty 100

## 2017-11-15 MED ORDER — NITROGLYCERIN 0.4 MG SL SUBL: 0.4000 mg | SUBLINGUAL_TABLET | SUBLINGUAL | Status: DC | PRN

## 2017-11-15 MED ORDER — HEPARIN SODIUM (PORCINE) 5000 UNIT/ML IJ SOLN
5000.0000 [IU] | Freq: Three times a day (TID) | INTRAMUSCULAR | Status: DC
  Administered 2017-11-16 – 2017-11-17 (×6): 5000 [IU] via SUBCUTANEOUS
  Filled 2017-11-15 (×6): qty 1

## 2017-11-15 MED ORDER — POTASSIUM CHLORIDE CRYS ER 20 MEQ PO TBCR
20.0000 meq | EXTENDED_RELEASE_TABLET | Freq: Every day | ORAL | Status: DC
  Administered 2017-11-16 – 2017-11-18 (×3): 20 meq via ORAL
  Filled 2017-11-15 (×3): qty 1

## 2017-11-15 MED ORDER — CARVEDILOL 3.125 MG PO TABS
3.1250 mg | ORAL_TABLET | Freq: Two times a day (BID) | ORAL | Status: DC
  Administered 2017-11-17 – 2017-11-18 (×3): 3.125 mg via ORAL
  Filled 2017-11-15 (×3): qty 1

## 2017-11-15 MED ORDER — ACETAMINOPHEN 325 MG PO TABS: 650.0000 mg | ORAL_TABLET | ORAL | Status: DC | PRN

## 2017-11-15 MED ORDER — LIDOCAINE HCL (CARDIAC) 20 MG/ML IV SOLN: 1.0000 mg/kg | Freq: Once | INTRAVENOUS | Status: DC

## 2017-11-15 MED ORDER — AMIODARONE HCL IN DEXTROSE 360-4.14 MG/200ML-% IV SOLN
60.0000 mg/h | Freq: Once | INTRAVENOUS | Status: AC
  Administered 2017-11-15: 60 mg/h via INTRAVENOUS
  Filled 2017-11-15: qty 200

## 2017-11-15 MED ORDER — ASPIRIN EC 81 MG PO TBEC
81.0000 mg | DELAYED_RELEASE_TABLET | Freq: Every day | ORAL | Status: DC
  Administered 2017-11-16 – 2017-11-18 (×3): 81 mg via ORAL
  Filled 2017-11-15 (×3): qty 1

## 2017-11-15 MED ORDER — ONDANSETRON HCL 4 MG/2ML IJ SOLN: 4.0000 mg | Freq: Four times a day (QID) | INTRAMUSCULAR | Status: DC | PRN

## 2017-11-15 MED ORDER — PROMETHAZINE HCL 25 MG PO TABS: 12.5000 mg | ORAL_TABLET | Freq: Three times a day (TID) | ORAL | Status: DC | PRN

## 2017-11-15 MED ORDER — LIDOCAINE HCL (CARDIAC) 20 MG/ML IV SOLN
1.0000 mg/kg | Freq: Once | INTRAVENOUS | Status: AC
  Administered 2017-11-15: 89 mg via INTRAVENOUS

## 2017-11-15 MED ORDER — LIDOCAINE IN D5W 4-5 MG/ML-% IV SOLN: 1.0000 mg/min | Freq: Once | INTRAVENOUS | Status: DC
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
## 2017-11-15 NOTE — ED Triage Notes (Signed)
Pt arrives via ems with reports of Dfib firing X4. Initial rate 180's in Vtach 300mg  amiodarone, 4mg  zofran given en route. 20g R hand. MD at bedside upon pt arrival. Pt alert and oriented upon arrival. 134/74, 120-180 Vtach, 97% RA. Pt scheduled to have ablation soon. Pt with no complaints other than pain where shocked.
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
## 2017-11-15 NOTE — Plan of Care (Signed)
Patient arrived from ER department via stretcher, alert and oriented x4, remains in SR in 60's.  Denies any pain or discomfort at the time.  Monitoring as per protocol.

## 2017-11-15 NOTE — ED Notes (Signed)
Pt refusing lab work. IV will not pull back.

## 2017-11-16 ENCOUNTER — Other Ambulatory Visit: Payer: Self-pay

## 2017-11-16 LAB — COMPREHENSIVE METABOLIC PANEL
ALK PHOS: 83 U/L (ref 38–126)
ALT: 13 U/L — ABNORMAL LOW (ref 17–63)
ANION GAP: 13 (ref 5–15)
AST: 18 U/L (ref 15–41)
Albumin: 3.8 g/dL (ref 3.5–5.0)
BILIRUBIN TOTAL: 0.6 mg/dL (ref 0.3–1.2)
BUN: 49 mg/dL — ABNORMAL HIGH (ref 6–20)
CALCIUM: 9.1 mg/dL (ref 8.9–10.3)
CO2: 28 mmol/L (ref 22–32)
Chloride: 100 mmol/L — ABNORMAL LOW (ref 101–111)
Creatinine, Ser: 3.27 mg/dL — ABNORMAL HIGH (ref 0.61–1.24)
GFR calc non Af Amer: 19 mL/min — ABNORMAL LOW (ref >60)
GFR, EST AFRICAN AMERICAN: 22 mL/min — AB (ref >60)
Glucose, Bld: 89 mg/dL (ref 65–99)
POTASSIUM: 3.6 mmol/L (ref 3.5–5.1)
Sodium: 141 mmol/L (ref 135–145)
TOTAL PROTEIN: 6.9 g/dL (ref 6.5–8.1)

## 2017-11-16 LAB — BASIC METABOLIC PANEL
Anion gap: 12 (ref 5–15)
BUN: 48 mg/dL — AB (ref 6–20)
CHLORIDE: 101 mmol/L (ref 101–111)
CO2: 28 mmol/L (ref 22–32)
Calcium: 8.9 mg/dL (ref 8.9–10.3)
Creatinine, Ser: 3.17 mg/dL — ABNORMAL HIGH (ref 0.61–1.24)
GFR calc Af Amer: 23 mL/min — ABNORMAL LOW (ref >60)
GFR calc non Af Amer: 20 mL/min — ABNORMAL LOW (ref >60)
GLUCOSE: 104 mg/dL — AB (ref 65–99)
POTASSIUM: 3.5 mmol/L (ref 3.5–5.1)
Sodium: 141 mmol/L (ref 135–145)

## 2017-11-16 LAB — CBC
HEMATOCRIT: 47.4 % (ref 39.0–52.0)
HEMOGLOBIN: 15.5 g/dL (ref 13.0–17.0)
MCH: 32.4 pg (ref 26.0–34.0)
MCHC: 32.7 g/dL (ref 30.0–36.0)
MCV: 99 fL (ref 78.0–100.0)
Platelets: 186 10*3/uL (ref 150–400)
RBC: 4.79 MIL/uL (ref 4.22–5.81)
RDW: 13 % (ref 11.5–15.5)
WBC: 9 10*3/uL (ref 4.0–10.5)

## 2017-11-16 LAB — MRSA PCR SCREENING: MRSA BY PCR: NEGATIVE

## 2017-11-16 LAB — TSH: TSH: 10.006 u[IU]/mL — ABNORMAL HIGH (ref 0.350–4.500)

## 2017-11-16 MED ORDER — CLONAZEPAM 1 MG PO TABS
2.0000 mg | ORAL_TABLET | Freq: Every day | ORAL | Status: DC
  Administered 2017-11-16 – 2017-11-17 (×2): 2 mg via ORAL
  Filled 2017-11-16 (×2): qty 2

## 2017-11-16 MED ORDER — AMIODARONE HCL IN DEXTROSE 360-4.14 MG/200ML-% IV SOLN
60.0000 mg/h | INTRAVENOUS | Status: DC
  Administered 2017-11-16 (×2): 60 mg/h via INTRAVENOUS
  Filled 2017-11-16 (×2): qty 200

## 2017-11-16 MED ORDER — HYDROCODONE-ACETAMINOPHEN 10-325 MG PO TABS
1.0000 | ORAL_TABLET | Freq: Three times a day (TID) | ORAL | Status: DC | PRN
  Administered 2017-11-17 – 2017-11-18 (×3): 1 via ORAL
  Filled 2017-11-16 (×3): qty 1

## 2017-11-16 MED ORDER — HYDROCODONE-ACETAMINOPHEN 10-325 MG PO TABS
1.0000 | ORAL_TABLET | Freq: Three times a day (TID) | ORAL | Status: DC | PRN
  Administered 2017-11-16: 1 via ORAL
  Filled 2017-11-16: qty 1

## 2017-11-16 MED ORDER — CLONAZEPAM 1 MG PO TABS
1.0000 mg | ORAL_TABLET | Freq: Three times a day (TID) | ORAL | Status: DC | PRN
Start: 2017-11-16 — End: 2017-11-18

## 2017-11-16 MED ORDER — MEXILETINE HCL 200 MG PO CAPS
200.0000 mg | ORAL_CAPSULE | Freq: Two times a day (BID) | ORAL | Status: DC
  Administered 2017-11-16 – 2017-11-18 (×5): 200 mg via ORAL
  Filled 2017-11-16 (×5): qty 1

## 2017-11-16 MED ORDER — SODIUM CHLORIDE 0.9 % IV SOLN
INTRAVENOUS | Status: DC
  Administered 2017-11-16: 22:00:00 via INTRAVENOUS

## 2017-11-16 MED ORDER — AMIODARONE HCL 200 MG PO TABS
400.0000 mg | ORAL_TABLET | Freq: Two times a day (BID) | ORAL | Status: DC
  Administered 2017-11-16 – 2017-11-18 (×5): 400 mg via ORAL
  Filled 2017-11-16 (×5): qty 2

## 2017-11-16 NOTE — Plan of Care (Signed)
  Problem: Education: Goal: Knowledge of General Education information will improve Outcome: Progressing   Problem: Health Behavior/Discharge Planning: Goal: Ability to manage health-related needs will improve Outcome: Progressing   Problem: Clinical Measurements: Goal: Ability to maintain clinical measurements within normal limits will improve Outcome: Progressing Goal: Will remain free from infection Outcome: Progressing Goal: Diagnostic test results will improve Outcome: Progressing Goal: Respiratory complications will improve Outcome: Progressing Goal: Cardiovascular complication will be avoided Outcome: Progressing   Problem: Activity: Goal: Risk for activity intolerance will decrease Outcome: Progressing   Problem: Nutrition: Goal: Adequate nutrition will be maintained Outcome: Progressing   Problem: Coping: Goal: Level of anxiety will decrease Outcome: Progressing   Problem: Elimination: Goal: Will not experience complications related to bowel motility Outcome: Progressing Goal: Will not experience complications related to urinary retention Outcome: Progressing   Problem: Pain Managment: Goal: General experience of comfort will improve Outcome: Progressing   Problem: Safety: Goal: Ability to remain free from injury will improve Outcome: Progressing   Problem: Skin Integrity: Goal: Risk for impaired skin integrity will decrease Outcome: Progressing   Problem: Education: Goal: Understanding of CV disease, CV risk reduction, and recovery process will improve Outcome: Progressing

## 2017-11-16 NOTE — H&P (View-Only) (Signed)
Progress Note  Patient Name: Richard Cook Date of Encounter: 11/16/2017  Primary Cardiologist: SK  Primary Electrophysiologist: SK   Patient Profile     62 y.o. male  With ICM prior NSTEMI CABG and last cath 3/15 with recurrent drug refractory VT  Readmitted 4/15 with VT following discontinuation of AAD in anticipation of RFCA-recurrent for VT   Antiarrhythmics Date Reason stopped  Amio 2017   [place] 3/19    DATE TEST EF   3/15 Cath  LIMA>D1; RIMA>LAD, RCA stent patent  1/16 Echo   35 %   4/17 Echo   40-45 %   7/18 Echo  15-20%    Date Cr K TSH LFTs  Hgb  1/19 1.78 3.9     3/19 3.29 4.6 9.330 72 15.5  3/19 2.63 4.2   15.9   He has had recurrent ventricular tachycardia.  35/57 complicated by acute renal failure.  Amiodarone initiated.  REcurrent VT 3/18 >> RFCA GT  Rendered noninducible      Subjective   Feels well this am but the shocks were very disruptive  Inpatient Medications    Scheduled Meds: . aspirin EC  81 mg Oral Daily  . carvedilol  3.125 mg Oral BID WC  . heparin  5,000 Units Subcutaneous Q8H  . potassium chloride SA  20 mEq Oral Daily   Continuous Infusions: . amiodarone 60 mg/hr (11/16/17 0720)  . lidocaine 1 mg/min (11/16/17 0700)   PRN Meds: acetaminophen, nitroGLYCERIN, ondansetron (ZOFRAN) IV, promethazine   Vital Signs    Vitals:   11/16/17 0615 11/16/17 0630 11/16/17 0645 11/16/17 0700  BP: 102/69 107/68 98/63 95/63   Pulse: (!) 50 (!) 51 (!) 49 (!) 49  Resp: 12 11 10 12   Temp:      TempSrc:      SpO2: 94% 94% 92% 94%  Weight:      Height:        Intake/Output Summary (Last 24 hours) at 11/16/2017 0727 Last data filed at 11/16/2017 0720 Gross per 24 hour  Intake 162.5 ml  Output 850 ml  Net -687.5 ml   Filed Weights   11/15/17 2009 11/15/17 2314  Weight: 185 lb (83.9 kg) 185 lb (83.9 kg)    Telemetry    No interval VT since 1900 ydat- Personally Reviewed  ECG     d  Physical Exam    GEN: No acute distress.   Neck: JVD flat  Cardiac: RRR, no   murmurs, rubs, or gallops.  Respiratory: Clear to auscultation bilaterally. GI: Soft, nontender, non-distended  MS:   edema; No deformity. Neuro:  Nonfocal  Psych: Normal affect  Skin Warm and dry   Labs    Chemistry Recent Labs  Lab 11/15/17 2348 11/16/17 0332  NA 141 141  K 3.6 3.5  CL 100* 101  CO2 28 28  GLUCOSE 89 104*  BUN 49* 48*  CREATININE 3.27* 3.17*  CALCIUM 9.1 8.9  PROT 6.9  --   ALBUMIN 3.8  --   AST 18  --   ALT 13*  --   ALKPHOS 83  --   BILITOT 0.6  --   GFRNONAA 19* 20*  GFRAA 22* 23*  ANIONGAP 13 12     Hematology Recent Labs  Lab 11/15/17 2348  WBC 9.0  RBC 4.79  HGB 15.5  HCT 47.4  MCV 99.0  MCH 32.4  MCHC 32.7  RDW 13.0  PLT 186    Cardiac EnzymesNo results for input(s):  TROPONINI in the last 168 hours. No results for input(s): TROPIPOC in the last 168 hours.   BNPNo results for input(s): BNP, PROBNP in the last 168 hours.   DDimer No results for input(s): DDIMER in the last 168 hours.   Radiology    Dg Chest Port 1 View  Result Date: 11/15/2017 CLINICAL DATA:  V tach. Hx of defibrillator, chronic combined systolic and diastolic CHF, hypertension, ischemic cardiomyopathy, MI(2013), v tach ablation(10/07/16), cardiac catheterization(2014), CABG(x3, 2014). Nonsmoker. EXAM: PORTABLE CHEST 1 VIEW COMPARISON:  10/13/2017 FINDINGS: LEFT-sided pacemaker overlies normal cardiac silhouette. Midline sternotomy. Fibrillation heads upper LEFT chest. Low lung volumes. Mild LEFT basilar atelectasis. No effusion, infiltrate pneumothorax. IMPRESSION: Low lung volumes.  No pulmonary edema.  No change from prior. Electronically Signed   By: Suzy Bouchard M.D.   On: 11/15/2017 21:00    Cardiac Studies      Device Interrogation    pendng   Assessment & Plan    Ventricular tachycardia recurrent  Ischemic cardiomyopathy S./P. CABG  Implantable defibrillator-[employer]tronic    Renal insufficiency grade 4  Peripheral edema  Sinus brady  PTSD  Elevated TSH .       VT now suppressed again on amio and lido;  Pattern was of triggered VT  Will review with interventional team regarding risk benefit for cath Will continue AAD for now and review with DR GT ( have not heard back from him today)  Change lido to mex  And resume amio po    Signed, Virl Axe, MD  11/16/2017, 7:27 AM

## 2017-11-16 NOTE — Progress Notes (Signed)
Interventional Team Note:   Pt with h/o stage 3 CKD, CAD s/p 3V CABG in 2014, ischemic cardiomyopathy, HTN, HLD, VT with prior VT ablation admitted following VT and ICD shock. No chest pain.   Dr. [name] would like to consider cardiac cath to rule out ischemic focus for VT. Creatinine is 3.17 this am.   I have discussed a cardiac cath on 11/17/17 with the patient and reviewed risk of contrast nephropathy. He is willing to proceed. Will let him eat today. Will start hydration at midnight tonight. REpeat BMET in am. If stable, proceed with cardiac cath tomorrow.   [name] 11/16/2017 12:03 PM

## 2017-11-16 NOTE — Progress Notes (Signed)
1301: Paged Dr. [name], pt states he has questions about procedure tomorrow and would like specifically to speak with Dr. [name].  1414: Paged Dr. [name] again regarding pt questions about procedure and pt also stating his home Norco and Klonopin have not been restarted and would like to ask MD to order them.  1502: Paged cards master to try and reach MD.  4599: Paged cards master.  1658: Paged cards on call, Dr. Meda Coffee, called back and will restart medication.

## 2017-11-16 NOTE — Progress Notes (Signed)
Progress Note  Patient Name: Richard Cook Date of Encounter: 11/16/2017  Primary Cardiologist: SK  Primary Electrophysiologist: SK   Patient Profile     62 y.o. male  With ICM prior NSTEMI CABG and last cath 3/15 with recurrent drug refractory VT  Readmitted 4/15 with VT following discontinuation of AAD in anticipation of RFCA-recurrent for VT   Antiarrhythmics Date Reason stopped  Amio 2017   Mex 3/19    DATE TEST EF   3/15 Cath  LIMA>D1; RIMA>LAD, RCA stent patent  1/16 Echo   35 %   4/17 Echo   40-45 %   7/18 Echo  15-20%    Date Cr K TSH LFTs  Hgb  1/19 1.78 3.9     3/19 3.29 4.6 9.330 72 15.5  3/19 2.63 4.2   15.9   He has had recurrent ventricular tachycardia.  16/10 complicated by acute renal failure.  Amiodarone initiated.  REcurrent VT 3/18 >> RFCA GT  Rendered noninducible      Subjective   Feels well this am but the shocks were very disruptive  Inpatient Medications    Scheduled Meds: . aspirin EC  81 mg Oral Daily  . carvedilol  3.125 mg Oral BID WC  . heparin  5,000 Units Subcutaneous Q8H  . potassium chloride SA  20 mEq Oral Daily   Continuous Infusions: . amiodarone 60 mg/hr (11/16/17 0720)  . lidocaine 1 mg/min (11/16/17 0700)   PRN Meds: acetaminophen, nitroGLYCERIN, ondansetron (ZOFRAN) IV, promethazine   Vital Signs    Vitals:   11/16/17 0615 11/16/17 0630 11/16/17 0645 11/16/17 0700  BP: 102/69 107/68 98/63 95/63   Pulse: (!) 50 (!) 51 (!) 49 (!) 49  Resp: 12 11 10 12   Temp:      TempSrc:      SpO2: 94% 94% 92% 94%  Weight:      Height:        Intake/Output Summary (Last 24 hours) at 11/16/2017 0727 Last data filed at 11/16/2017 0720 Gross per 24 hour  Intake 162.5 ml  Output 850 ml  Net -687.5 ml   Filed Weights   11/15/17 2009 11/15/17 2314  Weight: 185 lb (83.9 kg) 185 lb (83.9 kg)    Telemetry    No interval VT since 1900 ydat- Personally Reviewed  ECG     d  Physical Exam    GEN: No acute distress.   Neck: JVD flat  Cardiac: RRR, no   murmurs, rubs, or gallops.  Respiratory: Clear to auscultation bilaterally. GI: Soft, nontender, non-distended  MS:   edema; No deformity. Neuro:  Nonfocal  Psych: Normal affect  Skin Warm and dry   Labs    Chemistry Recent Labs  Lab 11/15/17 2348 11/16/17 0332  NA 141 141  K 3.6 3.5  CL 100* 101  CO2 28 28  GLUCOSE 89 104*  BUN 49* 48*  CREATININE 3.27* 3.17*  CALCIUM 9.1 8.9  PROT 6.9  --   ALBUMIN 3.8  --   AST 18  --   ALT 13*  --   ALKPHOS 83  --   BILITOT 0.6  --   GFRNONAA 19* 20*  GFRAA 22* 23*  ANIONGAP 13 12     Hematology Recent Labs  Lab 11/15/17 2348  WBC 9.0  RBC 4.79  HGB 15.5  HCT 47.4  MCV 99.0  MCH 32.4  MCHC 32.7  RDW 13.0  PLT 186    Cardiac EnzymesNo results for input(s):  TROPONINI in the last 168 hours. No results for input(s): TROPIPOC in the last 168 hours.   BNPNo results for input(s): BNP, PROBNP in the last 168 hours.   DDimer No results for input(s): DDIMER in the last 168 hours.   Radiology    Dg Chest Port 1 View  Result Date: 11/15/2017 CLINICAL DATA:  V tach. Hx of defibrillator, chronic combined systolic and diastolic CHF, hypertension, ischemic cardiomyopathy, MI(2013), v tach ablation(10/07/16), cardiac catheterization(2014), CABG(x3, 2014). Nonsmoker. EXAM: PORTABLE CHEST 1 VIEW COMPARISON:  10/13/2017 FINDINGS: LEFT-sided pacemaker overlies normal cardiac silhouette. Midline sternotomy. Fibrillation heads upper LEFT chest. Low lung volumes. Mild LEFT basilar atelectasis. No effusion, infiltrate pneumothorax. IMPRESSION: Low lung volumes.  No pulmonary edema.  No change from prior. Electronically Signed   By: Suzy Bouchard M.D.   On: 11/15/2017 21:00    Cardiac Studies      Device Interrogation    pendng   Assessment & Plan    Ventricular tachycardia recurrent  Ischemic cardiomyopathy S./P. CABG  Implantable defibrillator-Medtronic    Renal insufficiency grade 4  Peripheral edema  Sinus brady  PTSD  Elevated TSH .       VT now suppressed again on amio and lido;  Pattern was of triggered VT  Will review with interventional team regarding risk benefit for cath Will continue AAD for now and review with DR GT ( have not heard back from him today)  Change lido to mex  And resume amio po    Signed, Virl Axe, MD  11/16/2017, 7:27 AM

## 2017-11-17 ENCOUNTER — Encounter (HOSPITAL_COMMUNITY)
Admission: EM | Disposition: A | Discharge status: Home / Self Care | Payer: Self-pay | Source: Home / Self Care | Attending: Cardiology

## 2017-11-17 DIAGNOSIS — I2510 Atherosclerotic heart disease of native coronary artery without angina pectoris: Secondary | ICD-10-CM

## 2017-11-17 DIAGNOSIS — I255 Ischemic cardiomyopathy: Secondary | ICD-10-CM

## 2017-11-17 HISTORY — PX: LEFT HEART CATH AND CORS/GRAFTS ANGIOGRAPHY: CATH118250

## 2017-11-17 LAB — BASIC METABOLIC PANEL
Anion gap: 10 (ref 5–15)
BUN: 42 mg/dL — AB (ref 6–20)
CHLORIDE: 103 mmol/L (ref 101–111)
CO2: 26 mmol/L (ref 22–32)
Calcium: 8.8 mg/dL — ABNORMAL LOW (ref 8.9–10.3)
Creatinine, Ser: 2.96 mg/dL — ABNORMAL HIGH (ref 0.61–1.24)
GFR calc non Af Amer: 21 mL/min — ABNORMAL LOW (ref >60)
GFR, EST AFRICAN AMERICAN: 25 mL/min — AB (ref >60)
Glucose, Bld: 98 mg/dL (ref 65–99)
POTASSIUM: 3.6 mmol/L (ref 3.5–5.1)
SODIUM: 139 mmol/L (ref 135–145)

## 2017-11-17 LAB — MAGNESIUM: MAGNESIUM: 2 mg/dL (ref 1.7–2.4)

## 2017-11-17 SURGERY — LEFT HEART CATH AND CORS/GRAFTS ANGIOGRAPHY
Anesthesia: LOCAL

## 2017-11-17 MED ORDER — ASPIRIN 81 MG PO CHEW
81.0000 mg | CHEWABLE_TABLET | ORAL | Status: AC
  Administered 2017-11-17: 81 mg via ORAL
  Filled 2017-11-17: qty 1

## 2017-11-17 MED ORDER — HEPARIN (PORCINE) IN NACL 1000-0.9 UT/500ML-% IV SOLN
INTRAVENOUS | Status: AC
  Filled 2017-11-17: qty 1000

## 2017-11-17 MED ORDER — LIDOCAINE HCL (PF) 1 % IJ SOLN
INTRAMUSCULAR | Status: AC
  Filled 2017-11-17: qty 30

## 2017-11-17 MED ORDER — MIDAZOLAM HCL 2 MG/2ML IJ SOLN
INTRAMUSCULAR | Status: AC
  Filled 2017-11-17: qty 2

## 2017-11-17 MED ORDER — SODIUM CHLORIDE 0.9% FLUSH: 3.0000 mL | INTRAVENOUS | Status: DC | PRN

## 2017-11-17 MED ORDER — ASPIRIN 81 MG PO CHEW: 81.0000 mg | CHEWABLE_TABLET | ORAL | Status: DC

## 2017-11-17 MED ORDER — SODIUM CHLORIDE 0.9 % IV SOLN: 250.0000 mL | INTRAVENOUS | Status: DC | PRN

## 2017-11-17 MED ORDER — FENTANYL CITRATE (PF) 100 MCG/2ML IJ SOLN
INTRAMUSCULAR | Status: AC
Start: 2017-11-17 — End: 2017-11-17
  Filled 2017-11-17: qty 2

## 2017-11-17 MED ORDER — SODIUM CHLORIDE 0.9 % IV SOLN
INTRAVENOUS | Status: DC
  Administered 2017-11-17: 09:00:00 via INTRAVENOUS

## 2017-11-17 MED ORDER — PREDNISONE 20 MG PO TABS
20.0000 mg | ORAL_TABLET | Freq: Two times a day (BID) | ORAL | Status: DC
  Administered 2017-11-17 – 2017-11-18 (×3): 20 mg via ORAL
  Filled 2017-11-17 (×3): qty 1

## 2017-11-17 MED ORDER — FENTANYL CITRATE (PF) 100 MCG/2ML IJ SOLN
INTRAMUSCULAR | Status: DC | PRN
  Administered 2017-11-17 (×2): 25 ug via INTRAVENOUS

## 2017-11-17 MED ORDER — MIDAZOLAM HCL 2 MG/2ML IJ SOLN
INTRAMUSCULAR | Status: AC
Start: 2017-11-17 — End: 2017-11-17
  Filled 2017-11-17: qty 2

## 2017-11-17 MED ORDER — MIDAZOLAM HCL 2 MG/2ML IJ SOLN
INTRAMUSCULAR | Status: DC | PRN
  Administered 2017-11-17: 1 mg via INTRAVENOUS
  Administered 2017-11-17: 2 mg via INTRAVENOUS

## 2017-11-17 MED ORDER — HEPARIN (PORCINE) IN NACL 2-0.9 UNITS/ML
INTRAMUSCULAR | Status: AC | PRN
  Administered 2017-11-17 (×2): 500 mL

## 2017-11-17 MED ORDER — HEPARIN SODIUM (PORCINE) 5000 UNIT/ML IJ SOLN
5000.0000 [IU] | Freq: Three times a day (TID) | INTRAMUSCULAR | Status: DC
  Administered 2017-11-18 (×2): 5000 [IU] via SUBCUTANEOUS
  Filled 2017-11-17 (×2): qty 1

## 2017-11-17 MED ORDER — IOPAMIDOL (ISOVUE-370) INJECTION 76%
INTRAVENOUS | Status: AC
  Filled 2017-11-17: qty 125

## 2017-11-17 MED ORDER — SODIUM CHLORIDE 0.9% FLUSH
3.0000 mL | Freq: Two times a day (BID) | INTRAVENOUS | Status: DC
  Administered 2017-11-17: 3 mL via INTRAVENOUS

## 2017-11-17 MED ORDER — IOPAMIDOL (ISOVUE-370) INJECTION 76%
INTRAVENOUS | Status: DC | PRN
  Administered 2017-11-17: 69 mL

## 2017-11-17 MED ORDER — LIDOCAINE HCL (PF) 1 % IJ SOLN
INTRAMUSCULAR | Status: DC | PRN
  Administered 2017-11-17: 15 mL

## 2017-11-17 MED ORDER — SODIUM CHLORIDE 0.9% FLUSH
3.0000 mL | Freq: Two times a day (BID) | INTRAVENOUS | Status: DC
  Administered 2017-11-17 – 2017-11-18 (×2): 3 mL via INTRAVENOUS

## 2017-11-17 MED ORDER — SODIUM CHLORIDE 0.9 % IV SOLN: INTRAVENOUS | Status: AC

## 2017-11-17 SURGICAL SUPPLY — 13 items
CATH ANGIO 5F BER 100CM (CATHETERS) ×2 IMPLANT
CATH INFINITI 5FR MULTPACK ANG (CATHETERS) ×2 IMPLANT
COVER PRB 48X5XTLSCP FOLD TPE (BAG) ×1 IMPLANT
COVER PROBE 5X48 (BAG) ×1
KIT HEART LEFT (KITS) ×2 IMPLANT
KIT MICROPUNCTURE NIT STIFF (SHEATH) ×2 IMPLANT
PACK CARDIAC CATHETERIZATION (CUSTOM PROCEDURE TRAY) ×2 IMPLANT
SHEATH AVANTI 11CM 5FR (SHEATH) ×2 IMPLANT
TRANSDUCER W/STOPCOCK (MISCELLANEOUS) ×2 IMPLANT
TUBING CIL FLEX 10 FLL-RA (TUBING) ×2 IMPLANT
WIRE AQUATRAK .035X150 ANG (WIRE) ×2 IMPLANT
WIRE EMERALD 3MM-J .035X150CM (WIRE) ×2 IMPLANT
WIRE EMERALD 3MM-J .035X260CM (WIRE) ×2 IMPLANT

## 2017-11-17 NOTE — Interval H&P Note (Signed)
History and Physical Interval Note:  11/17/2017 5:01 PM  Richard Cook  has presented today for cardiac cath with the diagnosis of CAD s/p CABG, ventricular tachycardia.  The various methods of treatment have been discussed with the patient and family. After consideration of risks, benefits and other options for treatment, the patient has consented to  Procedure(s): LEFT HEART CATH AND CORS/GRAFTS ANGIOGRAPHY (N/A) as a surgical intervention .  The patient's history has been reviewed, patient examined, no change in status, stable for surgery.  I have reviewed the patient's chart and labs.  Questions were answered to the patient's satisfaction.    Cath Lab Visit (complete for each Cath Lab visit)  Clinical Evaluation Leading to the Procedure:   ACS: No.  Non-ACS:    Anginal Classification: CCS II  Anti-ischemic medical therapy: Minimal Therapy (1 class of medications)  Non-Invasive Test Results: No non-invasive testing performed  Prior CABG: Previous CABG        Richard Cook

## 2017-11-17 NOTE — Progress Notes (Signed)
Progress Note  Patient Name: Richard Cook Date of Encounter: 11/17/2017  Primary Cardiologist: SK  Primary Electrophysiologist: SK   Patient Profile     62 y.o. male  With ICM prior NSTEMI CABG and last cath 3/15 with recurrent drug refractory VT  Readmitted 4/15 with VT following discontinuation of AAD in anticipation of RFCA-recurrent for VT   Antiarrhythmics Date Reason stopped  Amio 2017   Mex 3/19    DATE TEST EF   3/15 Cath  LIMA>D1; RIMA>LAD, RCA stent patent  1/16 Echo   35 %   4/17 Echo   40-45 %   7/18 Echo  15-20%    Date Cr K TSH LFTs  Hgb  1/19 1.78 3.9     3/19 3.29 4.6 9.330 72 15.5  3/19 2.63 4.2   15.9   He has had recurrent ventricular tachycardia.  44/97 complicated by acute renal failure.  Amiodarone initiated.  REcurrent VT 3/18 >> RFCA GT  Rendered noninducible      Subjective   No VT overnight.  Concerned about the potential renal issues associated with catheterization.  Inpatient Medications    Scheduled Meds: . amiodarone  400 mg Oral BID  . aspirin EC  81 mg Oral Daily  . carvedilol  3.125 mg Oral BID WC  . clonazePAM  2 mg Oral QHS  . heparin  5,000 Units Subcutaneous Q8H  . mexiletine  200 mg Oral Q12H  . potassium chloride SA  20 mEq Oral Daily  . sodium chloride flush  3 mL Intravenous Q12H   Continuous Infusions: . sodium chloride    . sodium chloride     PRN Meds: sodium chloride, acetaminophen, clonazePAM, HYDROcodone-acetaminophen, nitroGLYCERIN, ondansetron (ZOFRAN) IV, promethazine, sodium chloride flush   Vital Signs    Vitals:   11/17/17 0400 11/17/17 0422 11/17/17 0500 11/17/17 0600  BP: (!) 84/53  (!) 82/54 100/73  Pulse: (!) 58  (!) 57 60  Resp: 11  12 12   Temp:  97.6 F (36.4 C)    TempSrc:  Oral    SpO2: 92%  93% 94%  Weight:      Height:        Intake/Output Summary (Last 24 hours) at 11/17/2017 0746 Last data filed at 11/17/2017 0600 Gross per 24 hour  Intake 987.9 ml    Output 1375 ml  Net -387.1 ml   Filed Weights   11/15/17 2009 11/15/17 2314 11/17/17 0329  Weight: 185 lb (83.9 kg) 185 lb (83.9 kg) 187 lb 14.4 oz (85.2 kg)    Telemetry    No interval sustained ventricular tachycardia a few PVCs and couplets  ECG       Physical Exam   Well developed and nourished in no acute distress HENT normal Neck supple with JVP-flat Clear Regular rate and rhythm, no murmurs or gallops Abd-soft with active BS No Clubbing cyanosis edema Skin-warm and dry A & Oriented  Grossly normal sensory and motor function   Labs    Chemistry Recent Labs  Lab 11/15/17 2348 11/16/17 0332 11/17/17 0320  NA 141 141 139  K 3.6 3.5 3.6  CL 100* 101 103  CO2 28 28 26   GLUCOSE 89 104* 98  BUN 49* 48* 42*  CREATININE 3.27* 3.17* 2.96*  CALCIUM 9.1 8.9 8.8*  PROT 6.9  --   --   ALBUMIN 3.8  --   --   AST 18  --   --   ALT 13*  --   --  ALKPHOS 83  --   --   BILITOT 0.6  --   --   GFRNONAA 19* 20* 21*  GFRAA 22* 23* 25*  ANIONGAP 13 12 10      Hematology Recent Labs  Lab 11/15/17 2348  WBC 9.0  RBC 4.79  HGB 15.5  HCT 47.4  MCV 99.0  MCH 32.4  MCHC 32.7  RDW 13.0  PLT 186    Cardiac EnzymesNo results for input(s): TROPONINI in the last 168 hours. No results for input(s): TROPIPOC in the last 168 hours.   BNPNo results for input(s): BNP, PROBNP in the last 168 hours.   DDimer No results for input(s): DDIMER in the last 168 hours.   Radiology    Dg Chest Port 1 View  Result Date: 11/15/2017 CLINICAL DATA:  V tach. Hx of defibrillator, chronic combined systolic and diastolic CHF, hypertension, ischemic cardiomyopathy, MI(2013), v tach ablation(10/07/16), cardiac catheterization(2014), CABG(x3, 2014). Nonsmoker. EXAM: PORTABLE CHEST 1 VIEW COMPARISON:  10/13/2017 FINDINGS: LEFT-sided pacemaker overlies normal cardiac silhouette. Midline sternotomy. Fibrillation heads upper LEFT chest. Low lung volumes. Mild LEFT basilar atelectasis. No  effusion, infiltrate pneumothorax. IMPRESSION: Low lung volumes.  No pulmonary edema.  No change from prior. Electronically Signed   By: Suzy Bouchard M.D.   On: 11/15/2017 21:00    Cardiac Studies      Device Interrogation    pendng   Assessment & Plan    Ventricular tachycardia recurrent  Ischemic cardiomyopathy S./P. CABG  Implantable defibrillator-Medtronic   Renal insufficiency grade 4  Peripheral edema  Sinus brady  PTSD  Elevated TSH .       VT remains controlled on amiodarone and mexiletine.  Lengthy discussion today regarding catheterization with the hopes that something can be identified which is contributing to his ventricular tachycardia.  The shocks have been so disruptive.  PTSD which had been previously reasonably controlled is now an issue again  Signed, Virl Axe, MD  11/17/2017, 7:46 AM

## 2017-11-17 NOTE — Progress Notes (Signed)
Site area:  Rt groin fa sheath Site Prior to Removal:  Level 0 Pressure Applied For: 20 minutes Manual:   yes Patient Status During Pull:  stable Post Pull Site:  Level  0 Post Pull Instructions Given:  yes Post Pull Pulses Present: palpable Dressing Applied:  Gauze and tegaderm Bedrest begins @ 1850 Comments:

## 2017-11-18 ENCOUNTER — Encounter (HOSPITAL_COMMUNITY): Payer: Self-pay | Admitting: Cardiovascular Disease

## 2017-11-18 ENCOUNTER — Other Ambulatory Visit: Payer: Self-pay | Admitting: Physician Assistant

## 2017-11-18 DIAGNOSIS — N179 Acute kidney failure, unspecified: Secondary | ICD-10-CM

## 2017-11-18 LAB — BASIC METABOLIC PANEL
ANION GAP: 10 (ref 5–15)
Anion gap: 8 (ref 5–15)
BUN: 39 mg/dL — ABNORMAL HIGH (ref 6–20)
BUN: 37 mg/dL — ABNORMAL HIGH (ref 6–20)
CHLORIDE: 105 mmol/L (ref 101–111)
CHLORIDE: 106 mmol/L (ref 101–111)
CO2: 24 mmol/L (ref 22–32)
CO2: 22 mmol/L (ref 22–32)
Calcium: 8.8 mg/dL — ABNORMAL LOW (ref 8.9–10.3)
Calcium: 9.3 mg/dL (ref 8.9–10.3)
Creatinine, Ser: 2.92 mg/dL — ABNORMAL HIGH (ref 0.61–1.24)
Creatinine, Ser: 2.77 mg/dL — ABNORMAL HIGH (ref 0.61–1.24)
GFR calc non Af Amer: 22 mL/min — ABNORMAL LOW (ref >60)
GFR calc non Af Amer: 23 mL/min — ABNORMAL LOW (ref >60)
GFR, EST AFRICAN AMERICAN: 25 mL/min — AB (ref >60)
GFR, EST AFRICAN AMERICAN: 27 mL/min — AB (ref >60)
Glucose, Bld: 116 mg/dL — ABNORMAL HIGH (ref 65–99)
Glucose, Bld: 150 mg/dL — ABNORMAL HIGH (ref 65–99)
POTASSIUM: 4.9 mmol/L (ref 3.5–5.1)
POTASSIUM: 5.3 mmol/L — AB (ref 3.5–5.1)
SODIUM: 138 mmol/L (ref 135–145)
Sodium: 137 mmol/L (ref 135–145)

## 2017-11-18 MED ORDER — FUROSEMIDE 40 MG PO TABS: 40.0000 mg | ORAL_TABLET | Freq: Every day | ORAL | 6 refills | Status: DC | PRN

## 2017-11-18 MED ORDER — AMIODARONE HCL 200 MG PO TABS: 400.0000 mg | ORAL_TABLET | Freq: Every day | ORAL | 6 refills | Status: DC

## 2017-11-18 MED ORDER — MEXILETINE HCL 200 MG PO CAPS: 200.0000 mg | ORAL_CAPSULE | Freq: Two times a day (BID) | ORAL | 6 refills | Status: DC

## 2017-11-18 MED FILL — Heparin Sod (Porcine)-NaCl IV Soln 1000 Unit/500ML-0.9%: INTRAVENOUS | Qty: 1000 | Status: AC

## 2017-11-18 NOTE — Progress Notes (Signed)
Progress Note  Patient Name: Richard Cook Date of Encounter: 11/18/2017  Primary Cardiologist: SK  Primary Electrophysiologist: SK   Patient Profile     62 y.o. male  With ICM prior NSTEMI CABG and last cath 3/15 with recurrent drug refractory VT  Readmitted 4/15 with VT following discontinuation of AAD in anticipation of RFCA-recurrent for VT   Antiarrhythmics Date Reason stopped  Amio 2017   Mex 3/19    DATE TEST EF   3/15 Cath  LIMA>D1; RIMA>LAD, RCA stent patent  1/16 Echo   35 %   4/17 Echo   40-45 %   7/18 Echo  15-20%    Date Cr K TSH LFTs  Hgb  1/19 1.78 3.9     3/19 3.29 4.6 9.330 72 15.5  3/19 2.63 4.2   15.9   He has had recurrent ventricular tachycardia.  92/42 complicated by acute renal failure.  Amiodarone initiated.  REcurrent VT 3/18 >> RFCA GT  Rendered noninducible      Subjective   No VT overnight ready to go home  Inpatient Medications    Scheduled Meds: . amiodarone  400 mg Oral BID  . aspirin EC  81 mg Oral Daily  . carvedilol  3.125 mg Oral BID WC  . clonazePAM  2 mg Oral QHS  . heparin  5,000 Units Subcutaneous Q8H  . mexiletine  200 mg Oral Q12H  . potassium chloride SA  20 mEq Oral Daily  . predniSONE  20 mg Oral BID WC  . sodium chloride flush  3 mL Intravenous Q12H   Continuous Infusions: . sodium chloride 50 mL/hr at 11/18/17 0800   PRN Meds: sodium chloride, acetaminophen, clonazePAM, HYDROcodone-acetaminophen, nitroGLYCERIN, ondansetron (ZOFRAN) IV, promethazine, sodium chloride flush   Vital Signs    Vitals:   11/18/17 0700 11/18/17 0800 11/18/17 0827 11/18/17 0900  BP: 93/64 138/87 138/87 107/79  Pulse: (!) 55 64 65 (!) 56  Resp: (!) 9 19  11   Temp:      TempSrc:      SpO2: 94% 96%  95%  Weight:      Height:        Intake/Output Summary (Last 24 hours) at 11/18/2017 0952 Last data filed at 11/18/2017 0900 Gross per 24 hour  Intake 1119.17 ml  Output 1100 ml  Net 19.17 ml   Filed  Weights   11/15/17 2009 11/15/17 2314 11/17/17 0329  Weight: 185 lb (83.9 kg) 185 lb (83.9 kg) 187 lb 14.4 oz (85.2 kg)    Telemetry    No interval sustained ventricular tachycardia a few PVCs and couplets  ECG       Physical Exam   Well developed and nourished in no acute distress HENT normal Neck supple with JVP-flat Clear Regular rate and rhythm, no murmurs or gallops Abd-soft with active BS No Clubbing cyanosis edema Skin-warm and dry A & Oriented  Grossly normal sensory and motor function \   Labs    Chemistry Recent Labs  Lab 11/15/17 2348 11/16/17 0332 11/17/17 0320 11/18/17 0402  NA 141 141 139 137  K 3.6 3.5 3.6 4.9  CL 100* 101 103 105  CO2 28 28 26 24   GLUCOSE 89 104* 98 116*  BUN 49* 48* 42* 39*  CREATININE 3.27* 3.17* 2.96* 2.92*  CALCIUM 9.1 8.9 8.8* 8.8*  PROT 6.9  --   --   --   ALBUMIN 3.8  --   --   --   AST  18  --   --   --   ALT 13*  --   --   --   ALKPHOS 83  --   --   --   BILITOT 0.6  --   --   --   GFRNONAA 19* 20* 21* 22*  GFRAA 22* 23* 25* 25*  ANIONGAP 13 12 10 8      Hematology Recent Labs  Lab 11/15/17 2348  WBC 9.0  RBC 4.79  HGB 15.5  HCT 47.4  MCV 99.0  MCH 32.4  MCHC 32.7  RDW 13.0  PLT 186    Cardiac EnzymesNo results for input(s): TROPONINI in the last 168 hours. No results for input(s): TROPIPOC in the last 168 hours.   BNPNo results for input(s): BNP, PROBNP in the last 168 hours.   DDimer No results for input(s): DDIMER in the last 168 hours.   Radiology    No results found.  Cardiac Studies   Catheterization demonstrated no significant intercurrent obstructive disease with patent grafts  Device Interrogation    pendng   Assessment & Plan    Ventricular tachycardia recurrent  Ischemic cardiomyopathy S./P. CABG  Implantable defibrillator-Medtronic   Renal insufficiency grade 4  Peripheral edema  Sinus brady  PTSD  Elevated TSH .       VT remains controlled on  amiodarone and mexiletine.  We will discharge him later today      We will send him home on amiodarone 400 mg daily and anticipate stopping his mexiletine 36 hours prior to his scheduled ablation  We will likely see his [place] of his creatinine over the next couple of days.  We will check it later today and then again on Monday  He will work on keeping himself hydrated we will hold his diuretics Signed, Virl Axe, MD  11/18/2017, 9:52 AM

## 2017-11-18 NOTE — Progress Notes (Signed)
While checking Right groin site, found bottle of Clonazepam with 8 tablets in bottle.  Informed patient tablets will be taking to the pharmacy until discharge.  Whitney PharmD notified and will take to pharmacy.  Patient indicated he did not take any.  Will monitor for any patient status changes.

## 2017-11-18 NOTE — Discharge Instructions (Signed)
Please arrive to [facility] 12/08/17 for you planned procedure with Dr. Lovena Le as previously instructed. Do not eat anything after midnight the evening prior to your procedure. Do not take mexiletine on 12/06/16 or 12/07/16

## 2017-11-18 NOTE — Progress Notes (Signed)
Pharmacist Heart Failure Core Measure Documentation  Assessment: Richard Cook has an EF documented as 25-30% on 10/25/17 by ECHO.  Rationale: Heart failure patients with left ventricular systolic dysfunction (LVSD) and an EF < 40% should be prescribed an angiotensin converting enzyme inhibitor (ACEI) or angiotensin receptor blocker (ARB) at discharge unless a contraindication is documented in the medical record.  This patient is not currently on an ACEI or ARB for HF.  This note is being placed in the record in order to provide documentation that a contraindication to the use of these agents is present for this encounter.  ACE Inhibitor or Angiotensin Receptor Blocker is contraindicated (specify all that apply)  []   ACEI allergy AND ARB allergy []   Angioedema []   Moderate or severe aortic stenosis []   Hyperkalemia []   Hypotension []   Renal artery stenosis [x]   Worsening renal function, preexisting renal disease or dysfunction   Albertina Parr, PharmD., BCPS Clinical Pharmacist Clinical phone for 11/18/17 until 3:30pm: [phone] If after 3:30pm, please call main pharmacy at: [phone]

## 2017-11-18 NOTE — Discharge Summary (Signed)
ELECTROPHYSIOLOGY PROCEDURE DISCHARGE SUMMARY    Patient ID: Richard Cook,  MRN: [id number], DOB/AGE: 1955/10/01 62 y.o.  Admit date: 11/15/2017 Discharge date: 11/18/2017  Primary Care Physician: Birdie Sons, MD  Primary Cardiologist/Electrophysiologist: Dr. [name]  Primary Discharge Diagnosis:  1. VT storm, ICD shocks 2. CAD 3. Acute on CKD  Secondary Discharge Diagnosis:  1. ICM 2. HTN  Allergies  Allergen Reactions  . Codeine Other (See Comments)    Tolerates Hydrocodone (??)  . Colchicine Nausea And Vomiting     Procedures This Admission:  11/17/16: LHC (Dr. Angelena Form)  Mid LM to [facility] LM lesion is 50% stenosed.  LIMA graft was visualized by angiography and is normal in caliber.  SVG graft was visualized by angiography and is normal in caliber.  RIMA graft was visualized by angiography.  Prox RCA to Mid RCA lesion is 10% stenosed.  Ost 1st Diag lesion is 20% stenosed.   1. There is a moderate distal left main stenosis. This appears to be no more than a 50% stenosis.  2. The LAD has no obstructive disease. The Diagonal has mild plaque. The LIMA graft to the Diagonal is patent. The RIMA graft to the LAD is atretic. (Flow down the native LAD is excellent).  3. The Circumflex has no obstructive disease. The vein graft to the OM is patent 4. The native RCA is a large dominant vessel with a patent proximal stent and minimal restenosis within the stent.   No area of potential ischemia is recognized.  Recommendations: Medical management of CAD and VT.   Brief HPI: Richard Cook is a 62 y.o. male  with a history of with a history of CAD s/p CABG, ICM, recurrent  VT s/p prior ablation, HTN, hyperlipidemia, and CK Dpresented with ICD shock.   Hospital Course:  The patient had recently seen Dr. Lovena Le 11/12/17 for recurrent slow VT on low dose amio in conjunction with mexitil. He has also noted that his diuretic dose has increased and that lasix is  not doing much to make him urinate.Stoped his amio and mexitil. Stop lasix start Torsemide. He is schedule to have another V tach ablation with Dr. Lovena Le on 12/08/17.  He was in his USOH up unit the afternoon of his admission when he had ICD shocks x 4. He noted elevated HR and EMS called. Upon arrival was found in VT. Started on IV amiodarone and lidocaine with bolus. He remained asymptomatic outside of the shocks. He was watching TV when he was shocked. Denied chest pain, SOB, orthopnea, PND, syncope or LE edema.   He was admitted to ICU on amiodarone and lidocaine gtts, the following morning seen by dr. [name] and transitioned to PO amiodarone and mexiletine.  The patient feels the change in his fluid pill may have been the trigger.  Dr. [name] suspects likely was holding his mexiletine in preparation for his ablation procedure (previous EP was unable to induce his VT).  Give his CAD, was felt reasonable to get LHC.  There was much discussion with the patient regarding his renal status and risks of contrast, was decided to pursue cath that is noted above, shoed no culprit lesions, there was no intervention.  The patient has gotten gentle hydration, his renal function numbers similar to priors, Dr. [name] recommended the patient stay today to follow these numbers with possibility they could acutely worsen in the next 24-48 hours though the patient was very clear that he is not willing to  stay, and is insistent on being discharged.  The patient was examined by Dr. [name] and considered stable for discharge to home with plans for BMET out patient tomorrow.  He will keep his planned EP/ablation as scheduled on 12/08/17.  He will hold his mexiletine 48hours prior.  He will use prior lasix dose of 40mg  daily as needed only.   Physical Exam: Vitals:   11/18/17 1400 11/18/17 1500 11/18/17 1600 11/18/17 1618  BP: (!) 136/93 (!) 137/94 126/82   Pulse: 62 62 60   Resp: 13 13 11    Temp:    98.3 F (36.8 C)    TempSrc:    Oral  SpO2: 100% 99% 98%   Weight:      Height:        GEN- The patient is well appearing, alert and oriented x 3 today.   HEENT: normocephalic, atraumatic; sclera clear, conjunctiva pink; hearing intact; oropharynx clear Lungs- CTA b/l, normal work of breathing.  No wheezes, rales, rhonchi Heart- RRR, no murmurs, rubs or gallops, PMI not laterally displaced GI- soft, non-tender, non-distended Extremities- no clubbing, cyanosis, or edema MS- no significant deformity or atrophy Skin- warm and dry, no rash or lesion Psych- euthymic mood, full affect Neuro- no gross defecits  Labs:   Lab Results  Component Value Date   WBC 9.0 11/15/2017   HGB 15.5 11/15/2017   HCT 47.4 11/15/2017   MCV 99.0 11/15/2017   PLT 186 11/15/2017    Recent Labs  Lab 11/15/17 2348  11/18/17 1442  NA 141   < > 138  K 3.6   < > 5.3*  CL 100*   < > 106  CO2 28   < > 22  BUN 49*   < > 37*  CREATININE 3.27*   < > 2.77*  CALCIUM 9.1   < > 9.3  PROT 6.9  --   --   BILITOT 0.6  --   --   ALKPHOS 83  --   --   ALT 13*  --   --   AST 18  --   --   GLUCOSE 89   < > 150*   < > = values in this interval not displayed.    Discharge Medications:  Allergies as of 11/18/2017      Reactions   Codeine Other (See Comments)   Tolerates Hydrocodone (??)   Colchicine Nausea And Vomiting      Medication List    STOP taking these medications   potassium chloride SA 20 MEQ tablet Commonly known as:  K-DUR,KLOR-CON     TAKE these medications   amiodarone 200 MG tablet Commonly known as:  PACERONE Take 2 tablets (400 mg total) by mouth daily.   aspirin EC 81 MG tablet Take 81 mg by mouth daily.   BEE POLLEN PO Take by mouth daily.   carvedilol 3.125 MG tablet Commonly known as:  COREG Take 1 tablet (3.125 mg total) by mouth 2 (two) times daily.   clonazePAM 1 MG tablet Commonly known as:  KLONOPIN Take 1 tablet (1 mg total) by mouth 3 (three) times daily as needed. What changed:     how much to take  when to take this   FISH OIL PO Take 1 capsule by mouth daily.   furosemide 40 MG tablet Commonly known as:  LASIX Take 1 tablet (40 mg total) by mouth daily as needed for fluid.   HYDROcodone-acetaminophen 10-325 MG tablet Commonly known as:  NORCO  Take 1 tablet by mouth every 8 (eight) hours as needed.   mexiletine 200 MG capsule Commonly known as:  MEXITIL Take 1 capsule (200 mg total) by mouth every 12 (twelve) hours.       Disposition:  Home Discharge Instructions    Diet - low sodium heart healthy   Complete by:  As directed    Increase activity slowly   Complete by:  As directed      Follow-up Information    [place] [facility] Follow up on 11/19/2017.   Specialty:  Cardiology Why:  For labs draw.  You can arrive anytime between 8:00AM and 4:00PM Contact information: [address], [place] [phone]          Duration of Discharge Encounter: Greater than 30 minutes including physician time.  Venetia Night, PA-C 11/18/2017 4:25 PM

## 2017-11-19 ENCOUNTER — Other Ambulatory Visit: Payer: Medicaid Other | Admitting: *Deleted

## 2017-11-19 DIAGNOSIS — N179 Acute kidney failure, unspecified: Secondary | ICD-10-CM

## 2017-11-20 LAB — BASIC METABOLIC PANEL
BUN / CREAT RATIO: 13 (ref 10–24)
BUN: 38 mg/dL — ABNORMAL HIGH (ref 8–27)
CO2: 21 mmol/L (ref 20–29)
CREATININE: 2.87 mg/dL — AB (ref 0.76–1.27)
Calcium: 9.6 mg/dL (ref 8.6–10.2)
Chloride: 102 mmol/L (ref 96–106)
GFR calc Af Amer: 26 mL/min/{1.73_m2} — ABNORMAL LOW (ref >59)
GFR calc non Af Amer: 23 mL/min/{1.73_m2} — ABNORMAL LOW (ref >59)
GLUCOSE: 85 mg/dL (ref 65–99)
Potassium: 3.7 mmol/L (ref 3.5–5.2)
SODIUM: 141 mmol/L (ref 134–144)

## 2017-11-24 ENCOUNTER — Telehealth: Payer: Self-pay | Admitting: *Deleted

## 2017-11-24 DIAGNOSIS — Z79899 Other long term (current) drug therapy: Secondary | ICD-10-CM

## 2017-11-24 NOTE — Telephone Encounter (Signed)
SPOKE TO PT ABOUT RESULTS AND AND AGREED TO COME IN ON 12-01-17 FOR LAB WORK  BMET  PT MENTIONED CONFUSION OF CARDI[facility]ERSION INSTRUCTIONS AND WHAT TIME TO BE THERE.  PT WAS ASKED WHERE HIS OV PAPERS WAS GIVEN. THEY SHOULD HAVE INFORMATION ON IT FROM DR Lovena Le OFFICE VISIT FOR [facility]ERSION..  PT WAS TOLD  DR Lovena Le NURSE WILL CONTACT BACK  TO TALK FURTHER INSTRUCTIONS FOR CARDIOVERSION.

## 2017-11-24 NOTE — Telephone Encounter (Signed)
-----   Message from [facility], [place] sent at 11/23/2017  8:55 AM EDT ----- Kidney function about the same, need to follow, please recheck BMET in a week.  Thanks renee

## 2017-11-26 ENCOUNTER — Telehealth: Payer: Self-pay | Admitting: Family Medicine

## 2017-11-26 DIAGNOSIS — M1A09X0 Idiopathic chronic gout, multiple sites, without tophus (tophi): Secondary | ICD-10-CM

## 2017-11-26 MED ORDER — PREDNISONE 10 MG PO TABS: ORAL_TABLET | ORAL | 0 refills | Status: DC

## 2017-11-26 NOTE — Telephone Encounter (Signed)
Please review. Thanks!  

## 2017-11-26 NOTE — Telephone Encounter (Signed)
Sent in

## 2017-11-26 NOTE — Telephone Encounter (Signed)
Pt requesting a refill of Prednisone for his gout.  States he has ran out. Requesting it be sent to [facility].

## 2017-12-01 ENCOUNTER — Other Ambulatory Visit: Payer: Medicaid Other | Admitting: *Deleted

## 2017-12-01 DIAGNOSIS — Z79899 Other long term (current) drug therapy: Secondary | ICD-10-CM

## 2017-12-02 ENCOUNTER — Telehealth: Payer: Self-pay

## 2017-12-02 ENCOUNTER — Ambulatory Visit (INDEPENDENT_AMBULATORY_CARE_PROVIDER_SITE_OTHER): Payer: Medicaid Other | Admitting: *Deleted

## 2017-12-02 DIAGNOSIS — I255 Ischemic cardiomyopathy: Secondary | ICD-10-CM | POA: Diagnosis not present

## 2017-12-02 LAB — BASIC METABOLIC PANEL
BUN/Creatinine Ratio: 17 (ref 10–24)
BUN: 67 mg/dL — AB (ref 8–27)
CALCIUM: 9.2 mg/dL (ref 8.6–10.2)
CHLORIDE: 102 mmol/L (ref 96–106)
CO2: 17 mmol/L — AB (ref 20–29)
Creatinine, Ser: 4 mg/dL — ABNORMAL HIGH (ref 0.76–1.27)
GFR calc Af Amer: 17 mL/min/{1.73_m2} — ABNORMAL LOW (ref >59)
GFR calc non Af Amer: 15 mL/min/{1.73_m2} — ABNORMAL LOW (ref >59)
GLUCOSE: 95 mg/dL (ref 65–99)
Potassium: 4.6 mmol/L (ref 3.5–5.2)
Sodium: 142 mmol/L (ref 134–144)

## 2017-12-02 NOTE — Telephone Encounter (Signed)
Left CVM for Pt notifying him he was having an ABLATION on Dec 08, 2017 arrival time 6:30 am at [facility]. Notified not to eat or drink or take any AM meds. Left this nurse name and # for call back if any addl questions.

## 2017-12-03 ENCOUNTER — Telehealth: Payer: Self-pay | Admitting: Family Medicine

## 2017-12-03 ENCOUNTER — Other Ambulatory Visit: Payer: Self-pay | Admitting: Internal Medicine

## 2017-12-03 DIAGNOSIS — M79673 Pain in unspecified foot: Secondary | ICD-10-CM

## 2017-12-03 NOTE — Telephone Encounter (Signed)
Pt calling requesting to get a referral for pain on side of his left foot. Please call to advise. Thanks CC

## 2017-12-03 NOTE — Telephone Encounter (Signed)
Please review. Thanks!  

## 2017-12-07 ENCOUNTER — Encounter: Payer: Self-pay | Admitting: Cardiology

## 2017-12-08 ENCOUNTER — Ambulatory Visit (HOSPITAL_COMMUNITY)
Admission: RE | Admit: 2017-12-08 | Discharge: 2017-12-09 | Disposition: A | Discharge status: Home / Self Care | Payer: Medicaid Other | Source: Ambulatory Visit | Attending: Internal Medicine | Admitting: Internal Medicine

## 2017-12-08 ENCOUNTER — Encounter (HOSPITAL_COMMUNITY): Payer: Self-pay | Admitting: Certified Registered Nurse Anesthetist

## 2017-12-08 ENCOUNTER — Encounter (HOSPITAL_COMMUNITY)
Admission: RE | Disposition: A | Discharge status: Home / Self Care | Payer: Self-pay | Source: Ambulatory Visit | Attending: Internal Medicine

## 2017-12-08 ENCOUNTER — Ambulatory Visit (HOSPITAL_COMMUNITY): Payer: Medicaid Other | Admitting: Anesthesiology

## 2017-12-08 ENCOUNTER — Other Ambulatory Visit: Payer: Self-pay

## 2017-12-08 DIAGNOSIS — E785 Hyperlipidemia, unspecified: Secondary | ICD-10-CM | POA: Insufficient documentation

## 2017-12-08 DIAGNOSIS — Z95810 Presence of automatic (implantable) cardiac defibrillator: Secondary | ICD-10-CM | POA: Insufficient documentation

## 2017-12-08 DIAGNOSIS — G8929 Other chronic pain: Secondary | ICD-10-CM | POA: Insufficient documentation

## 2017-12-08 DIAGNOSIS — I130 Hypertensive heart and chronic kidney disease with heart failure and stage 1 through stage 4 chronic kidney disease, or unspecified chronic kidney disease: Secondary | ICD-10-CM | POA: Insufficient documentation

## 2017-12-08 DIAGNOSIS — I5042 Chronic combined systolic (congestive) and diastolic (congestive) heart failure: Secondary | ICD-10-CM | POA: Diagnosis not present

## 2017-12-08 DIAGNOSIS — K219 Gastro-esophageal reflux disease without esophagitis: Secondary | ICD-10-CM | POA: Insufficient documentation

## 2017-12-08 DIAGNOSIS — F419 Anxiety disorder, unspecified: Secondary | ICD-10-CM | POA: Insufficient documentation

## 2017-12-08 DIAGNOSIS — I472 Ventricular tachycardia, unspecified: Secondary | ICD-10-CM

## 2017-12-08 DIAGNOSIS — Z7982 Long term (current) use of aspirin: Secondary | ICD-10-CM | POA: Diagnosis not present

## 2017-12-08 DIAGNOSIS — Z885 Allergy status to narcotic agent status: Secondary | ICD-10-CM | POA: Diagnosis not present

## 2017-12-08 DIAGNOSIS — Z955 Presence of coronary angioplasty implant and graft: Secondary | ICD-10-CM | POA: Insufficient documentation

## 2017-12-08 DIAGNOSIS — M549 Dorsalgia, unspecified: Secondary | ICD-10-CM | POA: Insufficient documentation

## 2017-12-08 DIAGNOSIS — I2510 Atherosclerotic heart disease of native coronary artery without angina pectoris: Secondary | ICD-10-CM | POA: Insufficient documentation

## 2017-12-08 DIAGNOSIS — Z951 Presence of aortocoronary bypass graft: Secondary | ICD-10-CM | POA: Insufficient documentation

## 2017-12-08 DIAGNOSIS — M069 Rheumatoid arthritis, unspecified: Secondary | ICD-10-CM | POA: Diagnosis not present

## 2017-12-08 DIAGNOSIS — I252 Old myocardial infarction: Secondary | ICD-10-CM | POA: Insufficient documentation

## 2017-12-08 DIAGNOSIS — I255 Ischemic cardiomyopathy: Secondary | ICD-10-CM | POA: Diagnosis not present

## 2017-12-08 DIAGNOSIS — N183 Chronic kidney disease, stage 3 (moderate): Secondary | ICD-10-CM | POA: Insufficient documentation

## 2017-12-08 HISTORY — PX: V TACH ABLATION: EP1227

## 2017-12-08 LAB — CBC
HEMATOCRIT: 41.9 % (ref 39.0–52.0)
HEMOGLOBIN: 13.7 g/dL (ref 13.0–17.0)
MCH: 32.2 pg (ref 26.0–34.0)
MCHC: 32.7 g/dL (ref 30.0–36.0)
MCV: 98.4 fL (ref 78.0–100.0)
Platelets: 177 10*3/uL (ref 150–400)
RBC: 4.26 MIL/uL (ref 4.22–5.81)
RDW: 13 % (ref 11.5–15.5)
WBC: 11.6 10*3/uL — ABNORMAL HIGH (ref 4.0–10.5)

## 2017-12-08 LAB — POCT ACTIVATED CLOTTING TIME
ACTIVATED CLOTTING TIME: 186 s
ACTIVATED CLOTTING TIME: 224 s
ACTIVATED CLOTTING TIME: 235 s
Activated Clotting Time: 246 seconds
Activated Clotting Time: 230 seconds
Activated Clotting Time: 246 seconds
Activated Clotting Time: 213 seconds
Activated Clotting Time: 180 seconds

## 2017-12-08 LAB — BASIC METABOLIC PANEL
Anion gap: 10 (ref 5–15)
BUN: 65 mg/dL — ABNORMAL HIGH (ref 6–20)
CALCIUM: 9 mg/dL (ref 8.9–10.3)
CHLORIDE: 103 mmol/L (ref 101–111)
CO2: 27 mmol/L (ref 22–32)
CREATININE: 4.15 mg/dL — AB (ref 0.61–1.24)
GFR calc Af Amer: 16 mL/min — ABNORMAL LOW (ref >60)
GFR, EST NON AFRICAN AMERICAN: 14 mL/min — AB (ref >60)
GLUCOSE: 100 mg/dL — AB (ref 65–99)
Potassium: 3.9 mmol/L (ref 3.5–5.1)
Sodium: 140 mmol/L (ref 135–145)

## 2017-12-08 SURGERY — V TACH ABLATION
Anesthesia: Monitor Anesthesia Care

## 2017-12-08 MED ORDER — EPHEDRINE SULFATE-NACL 50-0.9 MG/10ML-% IV SOSY
PREFILLED_SYRINGE | INTRAVENOUS | Status: DC | PRN
  Administered 2017-12-08 (×5): 10 mg via INTRAVENOUS

## 2017-12-08 MED ORDER — AMIODARONE HCL 200 MG PO TABS
400.0000 mg | ORAL_TABLET | Freq: Every day | ORAL | Status: DC
  Administered 2017-12-08 – 2017-12-09 (×2): 400 mg via ORAL
  Filled 2017-12-08 (×3): qty 2

## 2017-12-08 MED ORDER — BUPIVACAINE HCL (PF) 0.25 % IJ SOLN
INTRAMUSCULAR | Status: DC | PRN
  Administered 2017-12-08: 45 mL

## 2017-12-08 MED ORDER — HEPARIN SODIUM (PORCINE) 1000 UNIT/ML IJ SOLN
INTRAMUSCULAR | Status: DC | PRN
  Administered 2017-12-08: 2000 [IU] via INTRAVENOUS
  Administered 2017-12-08: 1000 [IU] via INTRAVENOUS
  Administered 2017-12-08: 2000 [IU] via INTRAVENOUS
  Administered 2017-12-08: 3000 [IU] via INTRAVENOUS
  Administered 2017-12-08: 6000 [IU] via INTRAVENOUS

## 2017-12-08 MED ORDER — FUROSEMIDE 40 MG PO TABS: 40.0000 mg | ORAL_TABLET | Freq: Every day | ORAL | Status: DC | PRN

## 2017-12-08 MED ORDER — MIDAZOLAM HCL 5 MG/5ML IJ SOLN
INTRAMUSCULAR | Status: DC | PRN
  Administered 2017-12-08: 2 mg via INTRAVENOUS

## 2017-12-08 MED ORDER — SODIUM CHLORIDE 0.9 % IV SOLN
INTRAVENOUS | Status: DC
  Administered 2017-12-08: 08:00:00 via INTRAVENOUS

## 2017-12-08 MED ORDER — BUPIVACAINE HCL (PF) 0.25 % IJ SOLN
INTRAMUSCULAR | Status: AC
  Filled 2017-12-08: qty 60

## 2017-12-08 MED ORDER — HEPARIN (PORCINE) IN NACL 2-0.9 UNITS/ML
INTRAMUSCULAR | Status: AC | PRN
  Administered 2017-12-08 (×3): 500 mL

## 2017-12-08 MED ORDER — FENTANYL CITRATE (PF) 100 MCG/2ML IJ SOLN
INTRAMUSCULAR | Status: DC | PRN
  Administered 2017-12-08 (×2): 50 ug via INTRAVENOUS

## 2017-12-08 MED ORDER — SODIUM CHLORIDE 0.9% FLUSH: 3.0000 mL | INTRAVENOUS | Status: DC | PRN

## 2017-12-08 MED ORDER — MEXILETINE HCL 200 MG PO CAPS
200.0000 mg | ORAL_CAPSULE | Freq: Two times a day (BID) | ORAL | Status: DC
  Administered 2017-12-08 – 2017-12-09 (×2): 200 mg via ORAL
  Filled 2017-12-08 (×2): qty 1

## 2017-12-08 MED ORDER — HEPARIN SODIUM (PORCINE) 1000 UNIT/ML IJ SOLN
INTRAMUSCULAR | Status: DC | PRN
  Administered 2017-12-08 (×2): 1000 [IU] via INTRAVENOUS

## 2017-12-08 MED ORDER — HEPARIN (PORCINE) IN NACL 1000-0.9 UT/500ML-% IV SOLN
INTRAVENOUS | Status: AC
  Filled 2017-12-08: qty 500

## 2017-12-08 MED ORDER — CLONAZEPAM 0.5 MG PO TABS
1.0000 mg | ORAL_TABLET | Freq: Three times a day (TID) | ORAL | Status: DC | PRN
  Administered 2017-12-09: 1 mg via ORAL
  Filled 2017-12-08: qty 2

## 2017-12-08 MED ORDER — PROPOFOL 10 MG/ML IV BOLUS
INTRAVENOUS | Status: DC | PRN
  Administered 2017-12-08: 30 mg via INTRAVENOUS
  Administered 2017-12-08: 20 mg via INTRAVENOUS

## 2017-12-08 MED ORDER — PHENYLEPHRINE 40 MCG/ML (10ML) SYRINGE FOR IV PUSH (FOR BLOOD PRESSURE SUPPORT)
PREFILLED_SYRINGE | INTRAVENOUS | Status: DC | PRN
  Administered 2017-12-08 (×2): 120 ug via INTRAVENOUS
  Administered 2017-12-08 (×2): 80 ug via INTRAVENOUS

## 2017-12-08 MED ORDER — ONDANSETRON HCL 4 MG/2ML IJ SOLN: 4.0000 mg | Freq: Four times a day (QID) | INTRAMUSCULAR | Status: DC | PRN

## 2017-12-08 MED ORDER — HEPARIN SODIUM (PORCINE) 1000 UNIT/ML IJ SOLN
INTRAMUSCULAR | Status: AC
  Filled 2017-12-08: qty 1

## 2017-12-08 MED ORDER — PROPOFOL 500 MG/50ML IV EMUL
INTRAVENOUS | Status: DC | PRN
  Administered 2017-12-08: 75 ug/kg/min via INTRAVENOUS

## 2017-12-08 MED ORDER — SODIUM CHLORIDE 0.9 % IV SOLN: 250.0000 mL | INTRAVENOUS | Status: DC | PRN

## 2017-12-08 MED ORDER — SODIUM CHLORIDE 0.9% FLUSH
3.0000 mL | Freq: Two times a day (BID) | INTRAVENOUS | Status: DC
  Administered 2017-12-09 (×2): 3 mL via INTRAVENOUS

## 2017-12-08 MED ORDER — FENTANYL CITRATE (PF) 100 MCG/2ML IJ SOLN: 25.0000 ug | INTRAMUSCULAR | Status: DC | PRN

## 2017-12-08 MED ORDER — HYDROCODONE-ACETAMINOPHEN 10-325 MG PO TABS
1.0000 | ORAL_TABLET | Freq: Four times a day (QID) | ORAL | Status: DC | PRN
  Administered 2017-12-08 – 2017-12-09 (×2): 1 via ORAL
  Filled 2017-12-08 (×2): qty 1

## 2017-12-08 MED ORDER — ASPIRIN EC 81 MG PO TBEC
81.0000 mg | DELAYED_RELEASE_TABLET | Freq: Every day | ORAL | Status: DC
  Administered 2017-12-09 (×2): 81 mg via ORAL
  Filled 2017-12-08 (×2): qty 1

## 2017-12-08 MED ORDER — PROMETHAZINE HCL 25 MG/ML IJ SOLN: 6.2500 mg | INTRAMUSCULAR | Status: DC | PRN

## 2017-12-08 MED ORDER — CARVEDILOL 3.125 MG PO TABS
3.1250 mg | ORAL_TABLET | Freq: Two times a day (BID) | ORAL | Status: DC
  Administered 2017-12-08 – 2017-12-09 (×2): 3.125 mg via ORAL
  Filled 2017-12-08 (×2): qty 1

## 2017-12-08 SURGICAL SUPPLY — 15 items
BAG SNAP BAND KOVER 36X36 (MISCELLANEOUS) ×2 IMPLANT
CATH HEX JOSEPH 2-5-2 65CM 6F (CATHETERS) ×2 IMPLANT
CATH JOSEPH QUAD ALLRED 6F REP (CATHETERS) ×4 IMPLANT
CATH MAPPNG PENTARAY F 2-6-2MM (CATHETERS) ×1 IMPLANT
CATH SMTCH THERMOCOOL SF DF (CATHETERS) ×2 IMPLANT
PACK EP LATEX FREE (CUSTOM PROCEDURE TRAY) ×1
PACK EP LF (CUSTOM PROCEDURE TRAY) ×1 IMPLANT
PAD DEFIB LIFELINK (PAD) ×2 IMPLANT
PATCH CARTO3 (PAD) ×2 IMPLANT
PENTARAY F 2-6-2MM (CATHETERS) ×2
SHEATH PINNACLE 6F 10CM (SHEATH) ×4 IMPLANT
SHEATH PINNACLE 7F 10CM (SHEATH) ×2 IMPLANT
SHEATH PINNACLE 8F 10CM (SHEATH) ×4 IMPLANT
SHIELD RADPAD SCOOP 12X17 (MISCELLANEOUS) ×2 IMPLANT
TUBING SMART ABLATE COOLFLOW (TUBING) ×2 IMPLANT

## 2017-12-08 NOTE — Anesthesia Preprocedure Evaluation (Signed)
Anesthesia Evaluation  Patient identified by MRN, date of birth, ID band Patient awake    Reviewed: Allergy & Precautions, NPO status , Patient's Chart, lab work & pertinent test results  Airway Mallampati: II  TM Distance: >3 FB Neck ROM: Full    Dental no notable dental hx.    Pulmonary neg pulmonary ROS,    Pulmonary exam normal breath sounds clear to auscultation       Cardiovascular hypertension, + CAD, + Past MI, + CABG and +CHF  + dysrhythmias Ventricular Tachycardia + Cardiac Defibrillator  Rhythm:Regular Rate:Normal + Systolic murmurs    Neuro/Psych negative neurological ROS  negative psych ROS   GI/Hepatic Neg liver ROS, GERD  ,  Endo/Other  negative endocrine ROS  Renal/GU negative Renal ROS  negative genitourinary   Musculoskeletal negative musculoskeletal ROS (+)   Abdominal   Peds negative pediatric ROS (+)  Hematology negative hematology ROS (+)   Anesthesia Other Findings   Reproductive/Obstetrics negative OB ROS                             Anesthesia Physical Anesthesia Plan  ASA: IV  Anesthesia Plan: MAC   Post-op Pain Management:    Induction: Intravenous  PONV Risk Score and Plan: 0  Airway Management Planned: Simple Face Mask  Additional Equipment:   Intra-op Plan:   Post-operative Plan:   Informed Consent: I have reviewed the patients History and Physical, chart, labs and discussed the procedure including the risks, benefits and alternatives for the proposed anesthesia with the patient or authorized representative who has indicated his/her understanding and acceptance.   Dental advisory given  Plan Discussed with: CRNA and Surgeon  Anesthesia Plan Comments:         Anesthesia Quick Evaluation

## 2017-12-08 NOTE — Transfer of Care (Signed)
Immediate Anesthesia Transfer of Care Note  Patient: [name]  Procedure(s) Performed: Stephanie Coup ABLATION (N/A )  Patient Location: PACU and Cath Lab  Anesthesia Type:MAC  Level of Consciousness: awake, alert  and oriented  Airway & Oxygen Therapy: Patient Spontanous Breathing and Patient connected to face mask oxygen  Post-op Assessment: Report given to RN and Post -op Vital signs reviewed and stable  Post vital signs: Reviewed and stable  Last Vitals:  Vitals Value Taken Time  BP 125/80 12/08/2017  2:02 PM  Temp 36.4 C 12/08/2017  2:01 PM  Pulse 65 12/08/2017  2:07 PM  Resp 11 12/08/2017  2:07 PM  SpO2 100 % 12/08/2017  2:07 PM  Vitals shown include unvalidated device data.  Last Pain:  Vitals:   12/08/17 1401  TempSrc:   PainSc: 0-No pain         Complications: No apparent anesthesia complications

## 2017-12-08 NOTE — Progress Notes (Signed)
Site area: Right jugular a 7 french venous sheath was removed  Site Prior to Removal:  Level 0  Pressure Applied For 10 MINUTES    Bedrest Beginning at  1620p  Manual:   Yes.    Patient Status During Pull:  stable  Post Pull Groin Site:  Level 0  Post Pull Instructions Given:  Yes.    Post Pull Pulses Present:  Yes.    Dressing Applied:  Yes.    Comments:  VS remain stable

## 2017-12-08 NOTE — Progress Notes (Addendum)
Site area: Right groin a 6 french X2 , 8 french venous sheaths and a 8 french arterial sheath was removed  Site Prior to Removal:  Level 0  Pressure Applied For 55 MINUTES    Bedrest Beginning at 1630p  Manual:   Yes.    Patient Status During Pull:  stable  Post Pull Groin Site:  Level 0  Post Pull Instructions Given:  Yes.    Post Pull Pulses Present:  Yes.    Dressing Applied:  Yes.    Comments:  VS remain stable  Dr Lovena Le in to see pt.

## 2017-12-08 NOTE — Anesthesia Postprocedure Evaluation (Signed)
Anesthesia Post Note  Patient: Richard Cook  Procedure(s) Performed: Stephanie Coup ABLATION (N/A )     Patient location during evaluation: PACU Anesthesia Type: MAC Level of consciousness: awake and alert Pain management: pain level controlled Vital Signs Assessment: post-procedure vital signs reviewed and stable Respiratory status: spontaneous breathing, nonlabored ventilation, respiratory function stable and patient connected to nasal cannula oxygen Cardiovascular status: stable and blood pressure returned to baseline Postop Assessment: no apparent nausea or vomiting Anesthetic complications: no    Last Vitals:  Vitals:   12/08/17 1405 12/08/17 1410  BP: 125/80 128/86  Pulse: 66 65  Resp: 12 11  Temp:    SpO2: 100% 100%    Last Pain:  Vitals:   12/08/17 1401  TempSrc:   PainSc: 0-No pain                 [name] S

## 2017-12-08 NOTE — Interval H&P Note (Signed)
History and Physical Interval Note:  12/08/2017 7:39 AM  Richard Cook  has presented today for surgery, with the diagnosis of V-Tach  The various methods of treatment have been discussed with the patient and family. After consideration of risks, benefits and other options for treatment, the patient has consented to  Procedure(s): V TACH ABLATION (N/A) as a surgical intervention .  The patient's history has been reviewed, patient examined, no change in status, stable for surgery.  I have reviewed the patient's chart and labs.  Questions were answered to the patient's satisfaction.     [name]

## 2017-12-09 ENCOUNTER — Encounter (HOSPITAL_COMMUNITY): Payer: Self-pay | Admitting: Internal Medicine

## 2017-12-09 ENCOUNTER — Telehealth: Payer: Self-pay | Admitting: Internal Medicine

## 2017-12-09 DIAGNOSIS — I2510 Atherosclerotic heart disease of native coronary artery without angina pectoris: Secondary | ICD-10-CM | POA: Diagnosis not present

## 2017-12-09 DIAGNOSIS — I472 Ventricular tachycardia: Secondary | ICD-10-CM | POA: Diagnosis not present

## 2017-12-09 DIAGNOSIS — I5042 Chronic combined systolic (congestive) and diastolic (congestive) heart failure: Secondary | ICD-10-CM | POA: Diagnosis not present

## 2017-12-09 DIAGNOSIS — I130 Hypertensive heart and chronic kidney disease with heart failure and stage 1 through stage 4 chronic kidney disease, or unspecified chronic kidney disease: Secondary | ICD-10-CM | POA: Diagnosis not present

## 2017-12-09 MED ORDER — AMIODARONE HCL 200 MG PO TABS: 200.0000 mg | ORAL_TABLET | Freq: Two times a day (BID) | ORAL | 6 refills | Status: DC

## 2017-12-09 NOTE — Telephone Encounter (Signed)
New Message   Patient is returning call in reference to referral to a kidney specialist. Please call.

## 2017-12-09 NOTE — Discharge Instructions (Signed)
Post procedure care instructions No driving for 6 months after last ICD shocks/therapy in April as discussed. No lifting over 5 lbs for 1 week. No vigorous or sexual activity for 1 week. You may return to work in one week. Keep procedure site clean & dry. If you notice increased pain, swelling, bleeding or pus, call/return!  You may shower, but no soaking baths/hot tubs/pools for 1 week.

## 2017-12-09 NOTE — Telephone Encounter (Signed)
Pt aware of 5/13 appt with [facility] for further renal fx evaluation. Will follow up after his appt.

## 2017-12-09 NOTE — Discharge Summary (Addendum)
ELECTROPHYSIOLOGY PROCEDURE DISCHARGE SUMMARY    Patient ID: Richard Cook,  MRN: [id number], DOB/AGE: April 16, 1956 62 y.o.  Admit date: 12/08/2017 Discharge date: 12/09/2017  Primary Care Physician: Birdie Sons, MD  Primary Cardiologist/Electrophysiologist: Dr. [name]  Primary Discharge Diagnosis:  1. VT  Secondary Discharge Diagnosis:  1. CAD 2. ICM w/ICD 3. HTN 4. CRI (III)     Acutely worse of late     Has appt with [facility] next week  Allergies  Allergen Reactions  . Codeine Other (See Comments)    Tolerates Hydrocodone (??)  . Colchicine Nausea And Vomiting     Procedures This Admission: 1.  Electrophysiology study and radiofrequency catheter ablation on 12/08/17 by Dr Lovena Le.   This study demonstrated Conclusion: Successful ablation around the patient's entire inferior and anterior scar.  The patient's ventricular tachycardia was not hemodynamically stable and did not appear to be the patient's clinical VT. No immediate procedure complication  Brief HPI: Richard Cook is a 62 y.o. male with a past medical history as outlined above.  He has had previous VT ablations, unfortunately with recurrent VT storms/shocks.  Risks, benefits, and alternatives to ablation were reviewed with the patient who wished to proceed.   Hospital Course:  The patient was admitted and underwent EPS/RFCA with details as outlined above.He was monitored on telemetry overnight which demonstrated SR, no PVCs or arrhythmias.  Groin and neck sites are without complication.  The patient feels well this morning, no CP, SOB or site discomfort, he was examined by Dr Lovena Le who considered him stable for discharge to home.  The patient is reminded no driving, 6 months post last VT/therapies in April.  He is scheduled to see [facility] next week, and will keep that appointment.  He mentions he was taking double lasix dosing for some time after his last hospital stay thinking his  urine output was not as brisk as it should be. He is instructed not to do this, and to take it only once daily.  He also mentioned a couple episodes where he felt off balance/legs felt weak while hiking, once resulting is a fall, no syncope.  he reduced his amiodarone to 200mg  BID, without recurrence, and without VT.  Will continue this until follow up, no s/p ablation.  He will notify us if any further gait instabilities.  . Follow up has been arranged in 4 weeks.  Wound care and activity restrictions were reviewed with the patient prior to discharge.   Physical Exam: Vitals:   12/08/17 2207 12/09/17 0007 12/09/17 0501 12/09/17 0808  BP: 100/71 117/77 111/74 128/82  Pulse: 61 60 (!) 56 (!) 57  Resp:  18 18   Temp:   98.3 F (36.8 C)   TempSrc:   Oral   SpO2:  98% 100%   Weight:   194 lb 11.2 oz (88.3 kg)   Height:        GEN- The patient is well appearing, alert and oriented x 3 today.   HEENT: normocephalic, atraumatic; sclera clear, conjunctiva pink; hearing intact; oropharynx clear; neck supple, no JVP, R IJ site is stable, no bleeding no hematoma Lymph- no cervical lymphadenopathy Lungs- CTA b/l, normal work of breathing.  No wheezes, rales, rhonchi Heart- RRR, no murmurs, rubs or gallops, PMI not laterally displaced GI- soft, non-tender, non-distended Extremities- no clubbing, cyanosis, or edema; DP/PT, 2+ bilaterally, groin without hematoma/bruit MS- no significant deformity or atrophy Skin- warm and dry, no rash or lesion  Psych- euthymic mood, full affect Neuro- strength and sensation are intact   Labs:   Lab Results  Component Value Date   WBC 11.6 (H) 12/08/2017   HGB 13.7 12/08/2017   HCT 41.9 12/08/2017   MCV 98.4 12/08/2017   PLT 177 12/08/2017    Recent Labs  Lab 12/08/17 0723  NA 140  K 3.9  CL 103  CO2 27  BUN 65*  CREATININE 4.15*  CALCIUM 9.0  GLUCOSE 100*    Discharge Medications:  Allergies as of 12/09/2017      Reactions   Codeine Other (See  Comments)   Tolerates Hydrocodone (??)   Colchicine Nausea And Vomiting      Medication List    TAKE these medications   amiodarone 200 MG tablet Commonly known as:  PACERONE Take 1 tablet (200 mg total) by mouth 2 (two) times daily.   aspirin EC 81 MG tablet Take 81 mg by mouth daily.   BEE POLLEN PO Take 1 capsule by mouth daily.   carvedilol 3.125 MG tablet Commonly known as:  COREG Take 1 tablet (3.125 mg total) by mouth 2 (two) times daily.   clonazePAM 1 MG tablet Commonly known as:  KLONOPIN Take 1 tablet (1 mg total) by mouth 3 (three) times daily as needed. What changed:    how much to take  when to take this  additional instructions   FISH OIL PO Take 1 capsule by mouth daily.   furosemide 40 MG tablet Commonly known as:  LASIX Take 1 tablet (40 mg total) by mouth daily as needed for fluid. What changed:  when to take this   HYDROcodone-acetaminophen 10-325 MG tablet Commonly known as:  NORCO Take 1 tablet by mouth every 8 (eight) hours as needed. What changed:  reasons to take this   mexiletine 200 MG capsule Commonly known as:  MEXITIL Take 1 capsule (200 mg total) by mouth every 12 (twelve) hours.   predniSONE 10 MG tablet Commonly known as:  DELTASONE Take 3 tab daily x 3 days, then 2 tab daily x 3 days, then 1 tab daily x 3 days What changed:    how much to take  how to take this  when to take this  reasons to take this  additional instructions Notes to patient:  Continue as you have been/as directed by the prescribing doctor       Disposition:  Home Discharge Instructions    Diet - low sodium heart healthy   Complete by:  As directed    Increase activity slowly   Complete by:  As directed      Follow-up Information    [name], MD Follow up on 01/07/2018.   Specialty:  Cardiology Why:  11:30AM Contact information: [address] [place] [address] [phone]           Duration of  Discharge Encounter: Greater than 30 minutes including physician time.  Venetia Night, PA-C 12/09/2017 8:50 AM  EP attending  Patient seen and examined.  Agree with the findings as noted above.  The patient is status post ventricular tachycardia ablation.  Overnight on telemetry he has had no VT.  The patient has no pain in his groin demonstrates no hematoma and no ecchymoses.  He will be discharged home on his current medical therapy and follow-up with Dr. [name].  [name], MD

## 2017-12-10 ENCOUNTER — Other Ambulatory Visit: Payer: Self-pay | Admitting: Family Medicine

## 2017-12-10 DIAGNOSIS — M5489 Other dorsalgia: Secondary | ICD-10-CM

## 2017-12-10 MED ORDER — HYDROCODONE-ACETAMINOPHEN 10-325 MG PO TABS: 1.0000 | ORAL_TABLET | Freq: Three times a day (TID) | ORAL | 0 refills | Status: DC | PRN

## 2017-12-10 NOTE — Telephone Encounter (Signed)
Pt states he will be out and he wants to make sure this gets called in today.

## 2017-12-10 NOTE — Telephone Encounter (Signed)
Pt called again to check on his prescription..  Pt is wanting to go out of town.    Richard Cook

## 2017-12-10 NOTE — Telephone Encounter (Signed)
Pt needs refill on his hydrocodone 10-325  [facility] [address] [place]  Thanks teri

## 2017-12-13 ENCOUNTER — Other Ambulatory Visit: Payer: Self-pay

## 2017-12-13 DIAGNOSIS — M1A09X0 Idiopathic chronic gout, multiple sites, without tophus (tophi): Secondary | ICD-10-CM

## 2017-12-13 MED ORDER — PREDNISONE 10 MG PO TABS: ORAL_TABLET | ORAL | 0 refills | Status: DC

## 2017-12-16 LAB — CUP PACEART REMOTE DEVICE CHECK
Battery Voltage: 3.01 V
Brady Statistic RV Percent Paced: 0.02 %
Date Time Interrogation Session: 20190502151911
HIGH POWER IMPEDANCE MEASURED VALUE: 78 Ohm
Implantable Lead Serial Number: 327195
Lead Channel Impedance Value: 646 Ohm
Lead Channel Pacing Threshold Pulse Width: 0.4 ms
Lead Channel Sensing Intrinsic Amplitude: 9.625 mV
Lead Channel Setting Pacing Amplitude: 2 V
MDC IDC LEAD IMPLANT DT: 20150318
MDC IDC LEAD LOCATION: 753860
MDC IDC MSMT BATTERY REMAINING LONGEVITY: 63 mo
MDC IDC MSMT LEADCHNL RV IMPEDANCE VALUE: 646 Ohm
MDC IDC MSMT LEADCHNL RV PACING THRESHOLD AMPLITUDE: 0.75 V
MDC IDC MSMT LEADCHNL RV SENSING INTR AMPL: 9.625 mV
MDC IDC PG IMPLANT DT: 20150318
MDC IDC SET LEADCHNL RV PACING PULSEWIDTH: 0.4 ms
MDC IDC SET LEADCHNL RV SENSING SENSITIVITY: 0.3 mV

## 2017-12-20 ENCOUNTER — Other Ambulatory Visit: Payer: Self-pay | Admitting: Family Medicine

## 2017-12-20 DIAGNOSIS — F411 Generalized anxiety disorder: Secondary | ICD-10-CM

## 2017-12-20 DIAGNOSIS — M5489 Other dorsalgia: Secondary | ICD-10-CM

## 2017-12-20 NOTE — Telephone Encounter (Signed)
1. Pt contacted office for refill request on the following medications:  clonazePAM (KLONOPIN) 1 MG tablet  [facility] [place]  2. Pt is also asking for a call back to discuss the status of his PA for HYDROcodone-acetaminophen (NORCO) 10-325 MG tablet. Pt stated that [facility] advised him they have sent the PA to our office 6 times and haven't heard back. Please advise. Thanks TNP

## 2017-12-21 MED ORDER — CLONAZEPAM 1 MG PO TABS: ORAL_TABLET | ORAL | 3 refills | Status: DC

## 2017-12-21 NOTE — Telephone Encounter (Signed)
PA is being processed .

## 2017-12-23 NOTE — Progress Notes (Signed)
Patient: Richard Cook Male    DOB: Nov 16, 1955   62 y.o.   MRN: [id number] Visit Date: 12/24/2017  Today's Provider: Lelon Huh, MD   Chief Complaint  Patient presents with  . Follow-up  . Hypertension  . Anxiety  . Back Pain   Subjective:    HPI   Hypertension, follow-up:  BP Readings from Last 3 Encounters:  12/24/17 110/70  12/09/17 128/82  11/18/17 126/82    He was last seen for hypertension 2 months ago.  BP at that visit was 104/80. Management since that visit includes; no changes.He reports good compliance with treatment. He is not having side effects. none He is exercising. He is adherent to low salt diet.   Outside blood pressures are 113/89. He is experiencing none.  Patient denies none.   Cardiovascular risk factors include advanced age (older than 45 for men, 17 for women).  Use of agents associated with hypertension: none.   ----------------------------------------------------------------  Anxiety state From 10/29/2017-no changes were made. He reports the only medication that has helped with his anxiety is clonanazepam.    Back pain without sciatica From 10/29/2017-no changes.   Patient also wants to discuss sleep problems. Patient states he has trouble going to sleep and staying asleep. Patient states he will stay up all night.  He states he is taking clonazepam n the morning and two in the evening which doesn't help sleep, but does help sleep. He was prescribed zolpidem several years ago which    Allergies  Allergen Reactions  . Codeine Other (See Comments)    Tolerates Hydrocodone (??)  . Colchicine Nausea And Vomiting     Current Outpatient Medications:  .  amiodarone (PACERONE) 200 MG tablet, Take 1 tablet (200 mg total) by mouth 2 (two) times daily., Disp: 60 tablet, Rfl: 6 .  aspirin EC 81 MG tablet, Take 81 mg by mouth daily., Disp: , Rfl:  .  BEE POLLEN PO, Take 1 capsule by mouth daily. , Disp: , Rfl:  .   Cholecalciferol (VITAMIN D3) 5000 units CAPS, Take 5,000 Units by mouth daily. , Disp: , Rfl:  .  clonazePAM (KLONOPIN) 1 MG tablet, Take one tablet every morning and two at bedtime as needed., Disp: 90 tablet, Rfl: 3 .  furosemide (LASIX) 40 MG tablet, Take 1 tablet (40 mg total) by mouth daily as needed for fluid. (Patient taking differently: Take 40 mg by mouth every other day. ), Disp: 30 tablet, Rfl: 6 .  HYDROcodone-acetaminophen (NORCO) 10-325 MG tablet, Take 1 tablet by mouth every 8 (eight) hours as needed., Disp: 60 tablet, Rfl: 0 .  mexiletine (MEXITIL) 200 MG capsule, Take 1 capsule (200 mg total) by mouth every 12 (twelve) hours., Disp: 60 capsule, Rfl: 6 .  Omega-3 Fatty Acids (FISH OIL PO), Take 1 capsule by mouth daily., Disp: , Rfl:  .  carvedilol (COREG) 3.125 MG tablet, Take 1 tablet (3.125 mg total) by mouth 2 (two) times daily., Disp: 60 tablet, Rfl: 2  Review of Systems  Constitutional: Negative for appetite change, chills and fever.  Respiratory: Negative for chest tightness, shortness of breath and wheezing.   Cardiovascular: Negative for chest pain and palpitations.  Gastrointestinal: Negative for abdominal pain, nausea and vomiting.  Psychiatric/Behavioral: Positive for sleep disturbance.    Social History   Tobacco Use  . Smoking status: Never Smoker  . Smokeless tobacco: Never Used  Substance Use Topics  . Alcohol use: No  Comment: 10/07/2016 "quit in the early 1990s"   Objective:   BP 110/70 (BP Location: Right Arm, Patient Position: Sitting, Cuff Size: Large)   Pulse 71   Temp 98.1 F (36.7 C) (Oral)   Resp 16   Wt 197 lb (89.4 kg)   SpO2 97%   BMI 28.27 kg/m  Vitals:   12/24/17 1452  BP: 110/70  Pulse: 71  Resp: 16  Temp: 98.1 F (36.7 C)  TempSrc: Oral  SpO2: 97%  Weight: 197 lb (89.4 kg)     Physical Exam   General Appearance:    Alert, cooperative, no distress  Eyes:    PERRL, conjunctiva/corneas clear, EOM's intact       Lungs:      Clear to auscultation bilaterally, respirations unlabored  Heart:    Regular rate and rhythm  Neurologic:   Awake, alert, oriented x 3. No apparent focal neurological           defect.           Assessment & Plan:     1. Anxiety state  - clonazePAM (KLONOPIN) 1 MG tablet; Take 1 tablet (1 mg total) by mouth 2 (two) times daily.  Dispense: 1 tablet; Refill: 0  2. Insomnia, unspecified type Try Lunesta 2mg  daily.        Lelon Huh, MD  [facility]

## 2017-12-24 ENCOUNTER — Encounter: Payer: Self-pay | Admitting: Family Medicine

## 2017-12-24 ENCOUNTER — Telehealth: Payer: Self-pay | Admitting: Family Medicine

## 2017-12-24 ENCOUNTER — Ambulatory Visit: Payer: Medicaid Other | Admitting: Family Medicine

## 2017-12-24 DIAGNOSIS — F411 Generalized anxiety disorder: Secondary | ICD-10-CM | POA: Diagnosis not present

## 2017-12-24 DIAGNOSIS — G4700 Insomnia, unspecified: Secondary | ICD-10-CM | POA: Diagnosis not present

## 2017-12-24 MED ORDER — CLONAZEPAM 1 MG PO TABS: 1.0000 mg | ORAL_TABLET | Freq: Two times a day (BID) | ORAL | 0 refills | Status: DC

## 2017-12-24 MED ORDER — ESZOPICLONE 2 MG PO TABS: 2.0000 mg | ORAL_TABLET | Freq: Every evening | ORAL | 1 refills | Status: DC | PRN

## 2017-12-24 NOTE — Telephone Encounter (Signed)
Pt called saying [facility] told him that insurance will not pay for the Lunesta  2mg  because it is written to take 1 to 1 and 1/2 tabs a day.  The pharmacy told him they may pay for 1 tab a day.  He will have to run it through.  Patient wants the medication today...  He uses [facility]

## 2017-12-24 NOTE — Telephone Encounter (Signed)
Please advise 

## 2017-12-28 ENCOUNTER — Telehealth: Payer: Self-pay | Admitting: Family Medicine

## 2017-12-28 NOTE — Telephone Encounter (Signed)
Please advise 

## 2017-12-28 NOTE — Telephone Encounter (Signed)
Pt called saying his insurance will not approve eszopiclone 2mg .  He picked up 4 pills over the weekend and it cost him 43.00.  He is requesting another medication that insurance will approve  His  Call pack is [phone]  Thanks [name]

## 2017-12-29 MED ORDER — SUVOREXANT 10 MG PO TABS: 10.0000 mg | ORAL_TABLET | Freq: Every day | ORAL | 2 refills | Status: DC

## 2017-12-29 NOTE — Telephone Encounter (Signed)
PA was approved. Faxed approval letter to pharmacy.

## 2017-12-29 NOTE — Telephone Encounter (Signed)
He can try Belsomra. I have no way of knowing whether or not his insurance will pay for it. Have sent prescription to [facility]

## 2017-12-29 NOTE — Telephone Encounter (Signed)
Needs PA 

## 2017-12-31 ENCOUNTER — Encounter: Payer: Self-pay | Admitting: Family Medicine

## 2017-12-31 ENCOUNTER — Ambulatory Visit: Payer: Medicaid Other | Admitting: Family Medicine

## 2017-12-31 VITALS — BP 110/70 | HR 61 | Temp 98.8°F | Resp 16 | Wt 193.0 lb

## 2017-12-31 DIAGNOSIS — I10 Essential (primary) hypertension: Secondary | ICD-10-CM

## 2017-12-31 DIAGNOSIS — L239 Allergic contact dermatitis, unspecified cause: Secondary | ICD-10-CM | POA: Diagnosis not present

## 2017-12-31 MED ORDER — PREDNISONE 20 MG PO TABS: 20.0000 mg | ORAL_TABLET | Freq: Two times a day (BID) | ORAL | 0 refills | Status: DC

## 2017-12-31 MED ORDER — CARVEDILOL 3.125 MG PO TABS: 3.1250 mg | ORAL_TABLET | Freq: Two times a day (BID) | ORAL | 5 refills | Status: DC

## 2017-12-31 NOTE — Progress Notes (Signed)
Patient: Richard Cook Male    DOB: 1956-02-27   62 y.o.   MRN: [id number] Visit Date: 12/31/2017  Today's Provider: Lelon Huh, MD   Chief Complaint  Patient presents with  . Rash    x 1 week   Subjective:    Rash  This is a new problem. Episode onset: 1 week ago. The problem has been gradually worsening since onset. Location: under both arms, wrists, sides, back and waist. The rash is characterized by pain, redness and itchiness. He was exposed to a new medication (Bee Pollen capsule). Pertinent negatives include no fever, shortness of breath or vomiting. Treatments tried: Cortisone cream. The treatment provided no relief.  Patient states the itching and pain improves with exposure to cold air.      Allergies  Allergen Reactions  . Codeine Other (See Comments)    Tolerates Hydrocodone (??)  . Colchicine Nausea And Vomiting     Current Outpatient Medications:  .  amiodarone (PACERONE) 200 MG tablet, Take 1 tablet (200 mg total) by mouth 2 (two) times daily., Disp: 60 tablet, Rfl: 6 .  aspirin EC 81 MG tablet, Take 81 mg by mouth daily., Disp: , Rfl:  .  BEE POLLEN PO, Take 1 capsule by mouth daily. , Disp: , Rfl:  .  Cholecalciferol (VITAMIN D3) 5000 units CAPS, Take 5,000 Units by mouth daily. , Disp: , Rfl:  .  clonazePAM (KLONOPIN) 1 MG tablet, Take 1 tablet (1 mg total) by mouth 2 (two) times daily., Disp: 1 tablet, Rfl: 0 .  furosemide (LASIX) 40 MG tablet, Take 1 tablet (40 mg total) by mouth daily as needed for fluid. (Patient taking differently: Take 40 mg by mouth every other day. ), Disp: 30 tablet, Rfl: 6 .  HYDROcodone-acetaminophen (NORCO) 10-325 MG tablet, Take 1 tablet by mouth every 8 (eight) hours as needed., Disp: 60 tablet, Rfl: 0 .  mexiletine (MEXITIL) 200 MG capsule, Take 1 capsule (200 mg total) by mouth every 12 (twelve) hours., Disp: 60 capsule, Rfl: 6 .  Omega-3 Fatty Acids (FISH OIL PO), Take 1 capsule by mouth daily., Disp: , Rfl:  .   Suvorexant (BELSOMRA) 10 MG TABS, Take 10 mg by mouth at bedtime., Disp: 30 tablet, Rfl: 2 .  carvedilol (COREG) 3.125 MG tablet, Take 1 tablet (3.125 mg total) by mouth 2 (two) times daily., Disp: 60 tablet, Rfl: 2  Review of Systems  Constitutional: Negative for appetite change, chills and fever.  Respiratory: Negative for chest tightness, shortness of breath and wheezing.   Cardiovascular: Negative for chest pain and palpitations.  Gastrointestinal: Negative for abdominal pain, nausea and vomiting.  Skin: Positive for rash.    Social History   Tobacco Use  . Smoking status: Never Smoker  . Smokeless tobacco: Never Used  Substance Use Topics  . Alcohol use: No    Comment: 10/07/2016 "quit in the early 1990s"   Objective:   BP 110/70 (BP Location: Left Arm, Patient Position: Sitting, Cuff Size: Large)   Pulse 61   Temp 98.8 F (37.1 C) (Oral)   Resp 16   Wt 193 lb (87.5 kg)   SpO2 97% Comment: room air  BMI 27.69 kg/m  Vitals:   12/31/17 1442  BP: 110/70  Pulse: 61  Resp: 16  Temp: 98.8 F (37.1 C)  TempSrc: Oral  SpO2: 97%  Weight: 193 lb (87.5 kg)     Physical Exam  General appearance: alert, well developed, well  nourished, cooperative and in no distress Head: Normocephalic, without obvious abnormality, atraumatic Respiratory: Respirations even and unlabored, normal respiratory rate Extremities: No gross deformities Skin: Diffuse urticarial eruption across trunk and both UEs.      Assessment & Plan:     1. Allergic dermatitis  - predniSONE (DELTASONE) 20 MG tablet; Take 1 tablet (20 mg total) by mouth 2 (two) times daily with a meal for 5 days.  Dispense: 10 tablet; Refill: 0  Call if symptoms change or if not rapidly improving.     2. Essential (primary) hypertension Needs refill.  - carvedilol (COREG) 3.125 MG tablet; Take 1 tablet (3.125 mg total) by mouth 2 (two) times daily.  Dispense: 60 tablet; Refill: Orchid, MD  [facility]

## 2018-01-05 ENCOUNTER — Telehealth: Payer: Self-pay | Admitting: Family Medicine

## 2018-01-05 DIAGNOSIS — L239 Allergic contact dermatitis, unspecified cause: Secondary | ICD-10-CM

## 2018-01-05 MED ORDER — PREDNISONE 20 MG PO TABS: 20.0000 mg | ORAL_TABLET | Freq: Every day | ORAL | 0 refills | Status: DC

## 2018-01-05 NOTE — Telephone Encounter (Signed)
He can take for 5 more days, but only needs to take one a day. Have sent prescription to pharmacy.

## 2018-01-05 NOTE — Telephone Encounter (Signed)
Please advise 

## 2018-01-05 NOTE — Telephone Encounter (Signed)
Pt has been taking prednisone for an allergic reaction.  He is on his last day of medication.  He says the rash and itching is about 80% gone.  He wants to know if you want to call him in another rx of prednisone or just let him finish out today.  Pt's call back is [phone]  He uses [facility]

## 2018-01-06 NOTE — Telephone Encounter (Signed)
Patient was advised. Expressed understanding.  

## 2018-01-07 ENCOUNTER — Encounter (INDEPENDENT_AMBULATORY_CARE_PROVIDER_SITE_OTHER): Payer: Self-pay

## 2018-01-07 ENCOUNTER — Encounter: Payer: Self-pay | Admitting: Internal Medicine

## 2018-01-07 ENCOUNTER — Ambulatory Visit (INDEPENDENT_AMBULATORY_CARE_PROVIDER_SITE_OTHER): Payer: Medicaid Other | Admitting: Internal Medicine

## 2018-01-07 VITALS — BP 144/100 | HR 61 | Ht 70.0 in | Wt 194.4 lb

## 2018-01-07 DIAGNOSIS — Z95810 Presence of automatic (implantable) cardiac defibrillator: Secondary | ICD-10-CM

## 2018-01-07 DIAGNOSIS — I472 Ventricular tachycardia, unspecified: Secondary | ICD-10-CM

## 2018-01-07 DIAGNOSIS — I255 Ischemic cardiomyopathy: Secondary | ICD-10-CM

## 2018-01-07 DIAGNOSIS — I5042 Chronic combined systolic (congestive) and diastolic (congestive) heart failure: Secondary | ICD-10-CM | POA: Diagnosis not present

## 2018-01-07 LAB — CUP PACEART INCLINIC DEVICE CHECK
Battery Remaining Longevity: 69 mo
Battery Voltage: 3.01 V
Brady Statistic RV Percent Paced: 0.44 %
HIGH POWER IMPEDANCE MEASURED VALUE: 86 Ohm
Implantable Lead Model: 181
Lead Channel Impedance Value: 703 Ohm
Lead Channel Impedance Value: 703 Ohm
Lead Channel Sensing Intrinsic Amplitude: 11.625 mV
Lead Channel Setting Pacing Amplitude: 2 V
Lead Channel Setting Pacing Pulse Width: 0.4 ms
Lead Channel Setting Sensing Sensitivity: 0.3 mV
MDC IDC LEAD IMPLANT DT: 20150318
MDC IDC LEAD LOCATION: 753860
MDC IDC LEAD SERIAL: 327195
MDC IDC MSMT LEADCHNL RV PACING THRESHOLD AMPLITUDE: 0.875 V
MDC IDC MSMT LEADCHNL RV PACING THRESHOLD PULSEWIDTH: 0.4 ms
MDC IDC MSMT LEADCHNL RV SENSING INTR AMPL: 9.25 mV
MDC IDC PG IMPLANT DT: 20150318
MDC IDC SESS DTM: 20190607142146
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
## 2018-01-07 NOTE — Patient Instructions (Signed)
Medication Instructions:  Your physician recommends that you continue on your current medications as directed. Please refer to the Current Medication list given to you today.  Labwork: None ordered.  Testing/Procedures: Your physician has requested that you have an echocardiogram. Echocardiography is a painless test that uses sound waves to create images of your heart. It provides your doctor with information about the size and shape of your heart and how well your heart's chambers and valves are working. This procedure takes approximately one hour. There are no restrictions for this procedure.   Follow-Up: Your physician recommends that you schedule a follow-up appointment in: 10 weeks with Dr [name].  Any Other Special Instructions Will Be Listed Below (If Applicable).     If you need a refill on your cardiac medications before your next appointment, please call your pharmacy.

## 2018-01-10 ENCOUNTER — Ambulatory Visit (INDEPENDENT_AMBULATORY_CARE_PROVIDER_SITE_OTHER): Payer: Self-pay | Admitting: *Deleted

## 2018-01-10 ENCOUNTER — Telehealth: Payer: Self-pay | Admitting: Cardiology

## 2018-01-10 DIAGNOSIS — I472 Ventricular tachycardia, unspecified: Secondary | ICD-10-CM

## 2018-01-10 LAB — CUP PACEART INCLINIC DEVICE CHECK
Brady Statistic RV Percent Paced: 0.31 %
HighPow Impedance: 87 Ohm
Implantable Lead Location: 753860
Implantable Lead Model: 181
Implantable Lead Serial Number: 327195
Lead Channel Impedance Value: 646 Ohm
Lead Channel Sensing Intrinsic Amplitude: 9.625 mV
Lead Channel Setting Pacing Amplitude: 2 V
Lead Channel Setting Sensing Sensitivity: 0.3 mV
MDC IDC LEAD IMPLANT DT: 20150318
MDC IDC MSMT BATTERY REMAINING LONGEVITY: 69 mo
MDC IDC MSMT BATTERY VOLTAGE: 2.97 V
MDC IDC MSMT LEADCHNL RV IMPEDANCE VALUE: 665 Ohm
MDC IDC MSMT LEADCHNL RV PACING THRESHOLD AMPLITUDE: 0.875 V
MDC IDC MSMT LEADCHNL RV PACING THRESHOLD PULSEWIDTH: 0.4 ms
MDC IDC MSMT LEADCHNL RV SENSING INTR AMPL: 9.625 mV
MDC IDC PG IMPLANT DT: 20150318
MDC IDC SESS DTM: 20190610143513
MDC IDC SET LEADCHNL RV PACING PULSEWIDTH: 0.4 ms

## 2018-01-10 NOTE — Telephone Encounter (Signed)
Reviewed remote transmission. RV impedance measured 87ohms today, with previous measurements on 6/9 at 101 triggering auditory alert. Trends still consistent with previous measurements. Will bring in to adjust alert settings. Patient agreeable to appointment at 2pm today.

## 2018-01-10 NOTE — Progress Notes (Signed)
Add on for auditory alert d/t RV defib lead impedance warning. Impedance 101ohms on 6/9 and 87ohms on 6/10 with consistency in impedance trends historically. Impedance alert increased to 130ohms from 100ohms.

## 2018-01-10 NOTE — Telephone Encounter (Signed)
Patient called and stated that he has heard his device alert tone at least 6 times over the weekend and this morning. Instructed pt to send a manual transmission w/ his home monitor. Once the remote transmission is received a Device Investment banker, corporate will review and call him back. Pt verbalized understanding.

## 2018-01-11 ENCOUNTER — Telehealth: Payer: Self-pay | Admitting: Internal Medicine

## 2018-01-11 ENCOUNTER — Ambulatory Visit: Payer: Medicaid Other

## 2018-01-11 ENCOUNTER — Encounter: Payer: Self-pay | Admitting: Podiatry

## 2018-01-11 ENCOUNTER — Ambulatory Visit: Payer: Medicaid Other | Admitting: Podiatry

## 2018-01-11 ENCOUNTER — Other Ambulatory Visit: Payer: Self-pay | Admitting: Family Medicine

## 2018-01-11 DIAGNOSIS — M21622 Bunionette of left foot: Secondary | ICD-10-CM

## 2018-01-11 DIAGNOSIS — M778 Other enthesopathies, not elsewhere classified: Secondary | ICD-10-CM

## 2018-01-11 DIAGNOSIS — M779 Enthesopathy, unspecified: Principal | ICD-10-CM

## 2018-01-11 DIAGNOSIS — M2042 Other hammer toe(s) (acquired), left foot: Secondary | ICD-10-CM | POA: Diagnosis not present

## 2018-01-11 DIAGNOSIS — M5489 Other dorsalgia: Secondary | ICD-10-CM

## 2018-01-11 MED ORDER — HYDROCODONE-ACETAMINOPHEN 10-325 MG PO TABS: 1.0000 | ORAL_TABLET | Freq: Three times a day (TID) | ORAL | 0 refills | Status: DC | PRN

## 2018-01-11 NOTE — Telephone Encounter (Signed)
New Message:      Pt is calling to inquire about a new medication he is supposed to be getting for dizziness. Pt states he doesn't remember the name of it.

## 2018-01-11 NOTE — Patient Instructions (Signed)
Pre-Operative Instructions  Congratulations, you have decided to take an important step towards improving your quality of life.  You can be assured that the doctors and staff at [facility] will be with you every step of the way.  Here are some important things you should know:  1. Plan to be at the surgery center/hospital at least 1 (one) hour prior to your scheduled time, unless otherwise directed by the surgical center/hospital staff.  You must have a responsible adult accompany you, remain during the surgery and drive you home.  Make sure you have directions to the surgical center/hospital to ensure you arrive on time. 2. If you are having surgery at [facility], you will need a copy of your medical history and physical form from your family physician within one month prior to the date of surgery. We will give you a form for your primary physician to complete.  3. We make every effort to accommodate the date you request for surgery.  However, there are times where surgery dates or times have to be moved.  We will contact you as soon as possible if a change in schedule is required.   4. No aspirin/ibuprofen for one week before surgery.  If you are on aspirin, any non-steroidal anti-inflammatory medications (Mobic, Aleve, Ibuprofen) should not be taken seven (7) days prior to your surgery.  You make take Tylenol for pain prior to surgery.  5. Medications - If you are taking daily heart and blood pressure medications, seizure, reflux, allergy, asthma, anxiety, pain or diabetes medications, make sure you notify the surgery center/hospital before the day of surgery so they can tell you which medications you should take or avoid the day of surgery. 6. No food or drink after midnight the night before surgery unless directed otherwise by surgical center/hospital staff. 7. No alcoholic beverages 24-hours prior to surgery.  No smoking 24-hours prior or 24-hours after  surgery. 8. Wear loose pants or shorts. They should be loose enough to fit over bandages, boots, and casts. 9. Don't wear slip-on shoes. Sneakers are preferred. 10. Bring your boot with you to the surgery center/hospital.  Also bring crutches or a walker if your physician has prescribed it for you.  If you do not have this equipment, it will be provided for you after surgery. 11. If you have not been contacted by the surgery center/hospital by the day before your surgery, call to confirm the date and time of your surgery. 12. Leave-time from work may vary depending on the type of surgery you have.  Appropriate arrangements should be made prior to surgery with your employer. 13. Prescriptions will be provided immediately following surgery by your doctor.  Fill these as soon as possible after surgery and take the medication as directed. Pain medications will not be refilled on weekends and must be approved by the doctor. 14. Remove nail polish on the operative foot and avoid getting pedicures prior to surgery. 15. Wash the night before surgery.  The night before surgery wash the foot and leg well with water and the antibacterial soap provided. Be sure to pay special attention to beneath the toenails and in between the toes.  Wash for at least three (3) minutes. Rinse thoroughly with water and dry well with a towel.  Perform this wash unless told not to do so by your physician.  Enclosed: 1 Ice pack (please put in freezer the night before surgery)   1 Hibiclens skin cleaner     Pre-op instructions  If you have any questions regarding the instructions, please do not hesitate to call our office.  [place]: [address], [place], [place] [address] -- [phone]  [place]: [address] [place], [place] [address] -- [phone]  [place]: [address]  [place], [place] [address] -- [phone]  [place]: [address], [address], [place],  [address] -- [phone]  Website:  [email or web address] 

## 2018-01-11 NOTE — Telephone Encounter (Signed)
Pt contacted office for refill request on the following medications:  HYDROcodone-acetaminophen (NORCO) 10-325 MG tablet   [facility] [place]  Last Rx: 12/10/17 LOV: 12/31/17 Please advise. Thanks TNP

## 2018-01-12 ENCOUNTER — Ambulatory Visit (INDEPENDENT_AMBULATORY_CARE_PROVIDER_SITE_OTHER): Payer: Medicaid Other | Admitting: Family Medicine

## 2018-01-12 ENCOUNTER — Ambulatory Visit (HOSPITAL_COMMUNITY): Payer: Medicaid Other | Attending: Cardiology

## 2018-01-12 ENCOUNTER — Encounter: Payer: Self-pay | Admitting: Family Medicine

## 2018-01-12 ENCOUNTER — Other Ambulatory Visit: Payer: Self-pay

## 2018-01-12 ENCOUNTER — Telehealth: Payer: Self-pay | Admitting: Family Medicine

## 2018-01-12 VITALS — BP 124/80 | HR 63 | Temp 97.8°F | Resp 16 | Wt 193.0 lb

## 2018-01-12 DIAGNOSIS — I071 Rheumatic tricuspid insufficiency: Secondary | ICD-10-CM | POA: Insufficient documentation

## 2018-01-12 DIAGNOSIS — I255 Ischemic cardiomyopathy: Secondary | ICD-10-CM

## 2018-01-12 DIAGNOSIS — G4730 Sleep apnea, unspecified: Secondary | ICD-10-CM | POA: Diagnosis not present

## 2018-01-12 DIAGNOSIS — I130 Hypertensive heart and chronic kidney disease with heart failure and stage 1 through stage 4 chronic kidney disease, or unspecified chronic kidney disease: Secondary | ICD-10-CM | POA: Insufficient documentation

## 2018-01-12 DIAGNOSIS — I472 Ventricular tachycardia, unspecified: Secondary | ICD-10-CM

## 2018-01-12 DIAGNOSIS — Z95810 Presence of automatic (implantable) cardiac defibrillator: Secondary | ICD-10-CM | POA: Diagnosis not present

## 2018-01-12 DIAGNOSIS — E785 Hyperlipidemia, unspecified: Secondary | ICD-10-CM | POA: Diagnosis not present

## 2018-01-12 DIAGNOSIS — R300 Dysuria: Secondary | ICD-10-CM

## 2018-01-12 DIAGNOSIS — I5042 Chronic combined systolic (congestive) and diastolic (congestive) heart failure: Secondary | ICD-10-CM

## 2018-01-12 DIAGNOSIS — L309 Dermatitis, unspecified: Secondary | ICD-10-CM

## 2018-01-12 DIAGNOSIS — I252 Old myocardial infarction: Secondary | ICD-10-CM | POA: Diagnosis not present

## 2018-01-12 DIAGNOSIS — N189 Chronic kidney disease, unspecified: Secondary | ICD-10-CM | POA: Diagnosis not present

## 2018-01-12 DIAGNOSIS — I2510 Atherosclerotic heart disease of native coronary artery without angina pectoris: Secondary | ICD-10-CM | POA: Insufficient documentation

## 2018-01-12 DIAGNOSIS — G4700 Insomnia, unspecified: Secondary | ICD-10-CM | POA: Diagnosis not present

## 2018-01-12 LAB — POCT URINALYSIS DIPSTICK
Appearance: NORMAL
Bilirubin, UA: NEGATIVE
Glucose, UA: NEGATIVE
LEUKOCYTES UA: NEGATIVE
NITRITE UA: NEGATIVE
ODOR: NORMAL
PROTEIN UA: POSITIVE — AB
SPEC GRAV UA: 1.01 (ref 1.010–1.025)
Urobilinogen, UA: 0.2 E.U./dL
pH, UA: 6 (ref 5.0–8.0)

## 2018-01-12 LAB — ECHOCARDIOGRAM COMPLETE: Weight: 3088 oz

## 2018-01-12 MED ORDER — FUROSEMIDE 40 MG PO TABS: 40.0000 mg | ORAL_TABLET | Freq: Every day | ORAL | 6 refills | Status: DC | PRN

## 2018-01-12 MED ORDER — ZALEPLON 10 MG PO CAPS: 10.0000 mg | ORAL_CAPSULE | Freq: Every evening | ORAL | 2 refills | Status: DC | PRN

## 2018-01-12 MED ORDER — KETOCONAZOLE 2 % EX CREA: 1.0000 "application " | TOPICAL_CREAM | Freq: Every day | CUTANEOUS | 0 refills | Status: DC

## 2018-01-12 NOTE — Telephone Encounter (Signed)
Spoke with pt. He was questioning Colchicine on his mychart. I told him this was listed as an allergy, not a medication for nausea and dizziness. He then questioned Dr [name] mentioning Bidil. Per Dr [name], he would like to see echo results before referring pt to [facility] and starting him on Bidil. Pt verbalized understanding and had no additional questions.

## 2018-01-12 NOTE — Progress Notes (Signed)
Patient: Richard Cook Male    DOB: 1956-05-14   62 y.o.   MRN: [id number] Visit Date: 01/12/2018  Today's Provider: Lelon Huh, MD   No chief complaint on file.  Subjective:    HPI  Allergic dermatitis From 12/31/2017 for generalized rash and was prescribed prednisone. He reports that that rash completely resolved but is now having redness and itching in groin area. Patient states he also has burning upon urination.    Also complains of persistent insomnia since last hospitalization which he attributes to Mexitil.  Was previously taking 2mg  Clonazepam at bedtime which was not effective. States he is now taking 4 x 1mg  clonazepam at bedtime which puts him to sleep, but he wakes up after about 4 hours. He was prescribed Lunesta on 12-24-2017 and was only able to afford 4 pills, but cost him $43. He states they did not help him sleep. Was changed to Belsomra 10mg  which he states was ineffective. He states he tried taking 2 at bedtime which didn't help. Was prescribed zolpidem in 2013 and states it didn't help a all.   Allergies  Allergen Reactions  . Codeine Other (See Comments)    Tolerates Hydrocodone (??)  . Colchicine Nausea And Vomiting     Current Outpatient Medications:  .  amiodarone (PACERONE) 200 MG tablet, Take 1 tablet (200 mg total) by mouth 2 (two) times daily., Disp: 60 tablet, Rfl: 6 .  aspirin EC 81 MG tablet, Take 81 mg by mouth daily., Disp: , Rfl:  .  BEE POLLEN PO, Take 1 capsule by mouth daily. , Disp: , Rfl:  .  carvedilol (COREG) 3.125 MG tablet, Take 1 tablet (3.125 mg total) by mouth 2 (two) times daily., Disp: 60 tablet, Rfl: 5 .  clonazePAM (KLONOPIN) 1 MG tablet, Take 1 tablet (1 mg total) by mouth 2 (two) times daily., Disp: 1 tablet, Rfl: 0 .  furosemide (LASIX) 40 MG tablet, Take 1 tablet (40 mg total) by mouth daily as needed for fluid. (Patient taking differently: Take 40 mg by mouth every other day. ), Disp: 30 tablet, Rfl: 6 .   HYDROcodone-acetaminophen (NORCO) 10-325 MG tablet, Take 1 tablet by mouth every 8 (eight) hours as needed., Disp: 60 tablet, Rfl: 0 .  mexiletine (MEXITIL) 200 MG capsule, Take 1 capsule (200 mg total) by mouth every 12 (twelve) hours., Disp: 60 capsule, Rfl: 6 .  Omega-3 Fatty Acids (FISH OIL PO), Take 1 capsule by mouth daily., Disp: , Rfl:    Review of Systems  Constitutional: Negative for appetite change.  Respiratory: Negative for chest tightness, shortness of breath and wheezing.   Cardiovascular: Negative for palpitations.  Skin: Positive for rash.    Social History   Tobacco Use  . Smoking status: Never Smoker  . Smokeless tobacco: Never Used  Substance Use Topics  . Alcohol use: No    Comment: 10/07/2016 "quit in the early 1990s"   Objective:   BP 124/80 (BP Location: Right Arm, Patient Position: Sitting, Cuff Size: Large)   Pulse 63   Temp 97.8 F (36.6 C) (Oral)   Resp 16   Wt 193 lb (87.5 kg)   SpO2 99%   BMI 27.69 kg/m  Vitals:   01/12/18 1048  BP: 124/80  Pulse: 63  Resp: 16  Temp: 97.8 F (36.6 C)  TempSrc: Oral  SpO2: 99%  Weight: 193 lb (87.5 kg)     Physical Exam   General Appearance:  Alert, cooperative, no distress  Eyes:    PERRL, conjunctiva/corneas clear, EOM's intact       Lungs:     Clear to auscultation bilaterally, respirations unlabored  Heart:    Regular rate and rhythm  Neurologic:   Awake, alert, oriented x 3. No apparent focal neurological           defect.   GU:   Mildly inflamed scrotum and groin area. No open lesions are discharge.    Results for orders placed or performed in visit on 01/12/18  POCT urinalysis dipstick  Result Value Ref Range   Color, UA Yellow    Clarity, UA Clear    Glucose, UA Negative Negative   Bilirubin, UA Neg    Ketones, UA Trace    Spec Grav, UA 1.010 1.010 - 1.025   Blood, UA Moderate Non-Hemolyzed    pH, UA 6.0 5.0 - 8.0   Protein, UA Positive (A) Negative   Urobilinogen, UA 0.2 0.2 or  1.0 E.U./dL   Nitrite, UA Neg    Leukocytes, UA Negative Negative   Appearance Normal    Odor Normal         Assessment & Plan:     1. Dermatitis  - ketoconazole (NIZORAL) 2 % cream; Apply 1 application topically daily.  Dispense: 15 g; Refill: 0 Call if symptoms change or if not rapidly improving.     2. Insomnia, unspecified type Given printed information regarding sleep hygiene. Counseled that he is taking excessive amounts of clonazepam and of potential lethal interaction with the hydrocodone/apap he is taking. Is not to mix with hydrocodone/apap and is not take more than on prescription. He has failed many other sleep aids and is limited by potential interactions with his other medications. Counseled that he may not be able to correct this with prescription medications. Will try- zaleplon (SONATA) 10 MG capsule; Take 1 capsule (10 mg total) by mouth at bedtime as needed for sleep.  Dispense: 30 capsule; Refill: 2  3. Dysuria No sign of uti. May have mild candidiasis and being prescribed topical antifungal as above.  - POCT urinalysis dipstick - CULTURE, URINE COMPREHENSIVE       Lelon Huh, MD  [facility]

## 2018-01-12 NOTE — Telephone Encounter (Signed)
Lilia Pro with [facility] stated that pt is in store to get HYDROcodone-acetaminophen ([place]) 10-325 MG tablet and [facility] have faxed PA information 01/11/18. Lilia Pro was advised that PA was approved on 12/29/17 and that the approval was faxed to pharmacy. Lilia Pro stated that Medicaid requires a PA each month for scheduled 2 medications and that I could contact Medicaid at [phone] so Medicaid could explain it to me. Lilia Pro stated that they can not give pt the medication until they received PA for June. Please advise. Thanks TNP

## 2018-01-12 NOTE — Patient Instructions (Signed)

## 2018-01-13 ENCOUNTER — Telehealth: Payer: Self-pay

## 2018-01-13 NOTE — Telephone Encounter (Signed)
Patient states his insurance won't cover the sleep medicine that you gave him yesterday.  He said that he took 2 of the 10 mg Zolpidem and went right to sleep.  He would like for you to fill the  Zolpidem 20 mg   [facility] [place]  CB#  [phone]

## 2018-01-13 NOTE — Telephone Encounter (Signed)
Per Medicaid, pharmacy will need to run medication through system as a Brand name only. [facility] states they do not carry the name brand Norco and neither does any of the other pharmacies.

## 2018-01-14 LAB — CULTURE, URINE COMPREHENSIVE

## 2018-01-14 MED ORDER — ZOLPIDEM TARTRATE 10 MG PO TABS: 10.0000 mg | ORAL_TABLET | Freq: Every evening | ORAL | 3 refills | Status: DC | PRN

## 2018-01-14 NOTE — Telephone Encounter (Signed)
Pt called following up on this.  He states he took two tablets of Zolpidem it is worked very well for him.  He said you originally wanted him to go up on Zolpidem.  I explained to him that he can only get 10mg  a day.    He wanted to know if there was something else he could try?  Thanks,   -Mickel Baas

## 2018-01-14 NOTE — Addendum Note (Signed)
Addended by: Birdie Sons on: 01/14/2018 08:11 AM   Modules accepted: Orders

## 2018-01-14 NOTE — Telephone Encounter (Signed)
The maximum does of zolpidem is 10mg  a day, they do not make 20mg  tablets. Insurance will only cover at most 30 tablets a month with no exceptions. Have sent refill to his pharmacy.

## 2018-01-14 NOTE — Telephone Encounter (Signed)
Dr Caryn Section, are you going to change this medication or does the patient have to pay out of pocket if he wants it?

## 2018-01-14 NOTE — Telephone Encounter (Signed)
I have not idea. None of this makes sense. I would suggest patient take prescription to another pharmacy with a copy of the authorization we received from Medicaid.

## 2018-01-14 NOTE — Telephone Encounter (Deleted)
Nothing that he either hasn't already tried, will not interact with his other medication, or is covered by his insurance.
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
## 2018-01-18 NOTE — Telephone Encounter (Signed)
New PA has been faxed to [facility].

## 2018-01-19 ENCOUNTER — Other Ambulatory Visit: Payer: Self-pay | Admitting: Family Medicine

## 2018-01-19 DIAGNOSIS — F411 Generalized anxiety disorder: Secondary | ICD-10-CM

## 2018-01-19 MED ORDER — CLONAZEPAM 1 MG PO TABS: 1.0000 mg | ORAL_TABLET | Freq: Two times a day (BID) | ORAL | 1 refills | Status: DC

## 2018-01-19 NOTE — Telephone Encounter (Signed)
Pt contacted office for refill request on the following medications:  clonazePAM (KLONOPIN) 1 MG tablet  [facility] [place]  Last Rx: 12/24/17 LOV: 01/12/18 Please advise. Thanks TNP

## 2018-01-19 NOTE — Addendum Note (Signed)
Addended by: Lelon Huh E on: 01/19/2018 11:14 AM   Modules accepted: Orders

## 2018-01-31 ENCOUNTER — Ambulatory Visit: Payer: Self-pay | Admitting: Family Medicine

## 2018-02-07 ENCOUNTER — Telehealth: Payer: Self-pay | Admitting: Internal Medicine

## 2018-02-07 MED ORDER — FUROSEMIDE 40 MG PO TABS: 40.0000 mg | ORAL_TABLET | Freq: Every day | ORAL | 0 refills | Status: DC

## 2018-02-07 NOTE — Telephone Encounter (Signed)
Pt calls today with c/o weight gain and swelling. He states he has not had any lasix in a few days and he has been doubling up his dose to 80mg  qd. I asked if he has called his renal doctor, he has not. I encouraged him to call Dr Posey Pronto to seek advise. We like to make sure we preserve his kidney function as much as possible.   Per Dr [name], we will send in a one time rx for 45 pills until pt can refill his lasix at the end of the month. Pt is to take 80mg  qd x 3 days, then begin 40mg  qd. Dr [name] will contact Dr Posey Pronto for further recommendation. I will follow up with pt later in the week.

## 2018-02-07 NOTE — Telephone Encounter (Signed)
New message   Pt c/o medication issue:  1. Name of Medication: furosemide (LASIX) 40 MG tablet  2. How are you currently taking this medication (dosage and times per day)? Take 1 tablet (40 mg total) by mouth daily as needed for fluid.  3. Are you having a reaction (difficulty breathing--STAT)? NO  4. What is your medication issue? Fluid     1) How much weight have you gained and in what time span? 10lbs in 2 weeks  2) If swelling, where is the swelling located? Chest, legs, ankles, feet  3) Are you currently taking a fluid pill? YES  4) Are you currently SOB? NO  5) Do you have a log of your daily weights (if so, list)? 195lbs today  6) Have you gained 3 pounds in a day or 5 pounds in a week?   7) Have you traveled recently? no

## 2018-02-09 ENCOUNTER — Other Ambulatory Visit: Payer: Self-pay | Admitting: Family Medicine

## 2018-02-09 DIAGNOSIS — M5489 Other dorsalgia: Secondary | ICD-10-CM

## 2018-02-09 MED ORDER — HYDROCODONE-ACETAMINOPHEN 10-325 MG PO TABS: 1.0000 | ORAL_TABLET | Freq: Three times a day (TID) | ORAL | 0 refills | Status: DC | PRN

## 2018-02-09 NOTE — Telephone Encounter (Signed)
Patient needs refill on Hydrocodone sent to [facility] on [address] in [facility]

## 2018-02-23 ENCOUNTER — Encounter: Payer: Self-pay | Admitting: *Deleted

## 2018-03-03 ENCOUNTER — Telehealth: Payer: Self-pay | Admitting: Family Medicine

## 2018-03-03 ENCOUNTER — Ambulatory Visit (INDEPENDENT_AMBULATORY_CARE_PROVIDER_SITE_OTHER): Payer: Medicaid Other | Admitting: *Deleted

## 2018-03-03 ENCOUNTER — Encounter: Payer: Self-pay | Admitting: Cardiology

## 2018-03-03 DIAGNOSIS — I472 Ventricular tachycardia, unspecified: Secondary | ICD-10-CM

## 2018-03-03 NOTE — Telephone Encounter (Signed)
Pt stated that he has increase right shoulder blade pain and he wasn't sure if this was muscular or related to his kidney disease. Pt was advised that we can't treat over the phone and was offered multiple appts for today. Pt stated that he was told you shouldn't be in a car when you have back pain this bad. Pt stated that he has HYDROcodone-acetaminophen (NORCO) 10-325 MG tablet but he can only take 2 a day and that doesn't help with the pain. Pt stated that a friend of his gave him muscle relaxers and he took 2 of those and they didn't help. Pt stated he doesn't know the name of that medication. Please advise. Thanks TNP

## 2018-03-03 NOTE — Progress Notes (Signed)
Patient: Richard Cook Male    DOB: 07-03-1956   62 y.o.   MRN: [id number] Visit Date: 03/04/2018  Today's Provider: Lelon Huh, MD   Chief Complaint  Patient presents with  . Back Pain   Subjective:    HPI  Back pain without sciatica  Patient states he has had 2 bulging discs in mid-back for years. Patient states he went hiking this past weekend and was a little sore a day afterwards. However patient stated 2 days ago he started having severe pain in mid-back on right side. Painful to walk or move quickly.  Having spasms that are keeping him up at night.    Allergies  Allergen Reactions  . Codeine Other (See Comments)    Tolerates Hydrocodone (??)  . Colchicine Nausea And Vomiting     Current Outpatient Medications:  .  amiodarone (PACERONE) 200 MG tablet, Take 1 tablet (200 mg total) by mouth 2 (two) times daily., Disp: 60 tablet, Rfl: 6 .  aspirin EC 81 MG tablet, Take 81 mg by mouth daily., Disp: , Rfl:  .  BEE POLLEN PO, Take 1 capsule by mouth daily. , Disp: , Rfl:  .  carvedilol (COREG) 3.125 MG tablet, Take 1 tablet (3.125 mg total) by mouth 2 (two) times daily., Disp: 60 tablet, Rfl: 5 .  clonazePAM (KLONOPIN) 1 MG tablet, Take 1 tablet (1 mg total) by mouth 2 (two) times daily., Disp: 60 tablet, Rfl: 1 .  furosemide (LASIX) 40 MG tablet, Take 1 tablet (40 mg total) by mouth daily as needed for fluid., Disp: 30 tablet, Rfl: 6 .  furosemide (LASIX) 40 MG tablet, Take 1 tablet (40 mg total) by mouth daily. For the next 3 days, take 80mg  per day., Disp: 45 tablet, Rfl: 0 .  HYDROcodone-acetaminophen (NORCO) 10-325 MG tablet, Take 1 tablet by mouth every 8 (eight) hours as needed., Disp: 60 tablet, Rfl: 0 .  ketoconazole (NIZORAL) 2 % cream, Apply 1 application topically daily., Disp: 15 g, Rfl: 0 .  mexiletine (MEXITIL) 200 MG capsule, Take 1 capsule (200 mg total) by mouth every 12 (twelve) hours., Disp: 60 capsule, Rfl: 6 .  Omega-3 Fatty Acids (FISH  OIL PO), Take 1 capsule by mouth daily., Disp: , Rfl:  .  zolpidem (AMBIEN) 10 MG tablet, Take 1 tablet (10 mg total) by mouth at bedtime as needed for sleep., Disp: 30 tablet, Rfl: 3  Review of Systems  Constitutional: Negative for appetite change, chills and fever.  Respiratory: Negative for chest tightness, shortness of breath and wheezing.   Cardiovascular: Negative for chest pain and palpitations.  Gastrointestinal: Negative for abdominal pain, nausea and vomiting.    Social History   Tobacco Use  . Smoking status: Never Smoker  . Smokeless tobacco: Never Used  Substance Use Topics  . Alcohol use: No    Comment: 10/07/2016 "quit in the early 1990s"   Objective:   BP 125/86 (BP Location: Right Arm, Patient Position: Sitting, Cuff Size: Large)   Pulse 68   Temp 97.6 F (36.4 C) (Oral)   Resp 16   Ht 5\' 10"  (1.778 m)   Wt 189 lb (85.7 kg)   SpO2 95%   BMI 27.12 kg/m  Vitals:   03/04/18 1537  BP: 125/86  Pulse: 68  Resp: 16  Temp: 97.6 F (36.4 C)  TempSrc: Oral  SpO2: 95%  Weight: 189 lb (85.7 kg)  Height: 5\' 10"  (1.778 m)  Physical Exam  General appearance: alert, well developed, well nourished, cooperative and in no distress Head: Normocephalic, without obvious abnormality, atraumatic Respiratory: Respirations even and unlabored, normal respiratory rate MS:  Tender swollen right subscapularis.      Assessment & Plan:     1. Back strain, initial encounter  - tiZANidine (ZANAFLEX) 4 MG capsule; Take 1 capsule (4 mg total) by mouth 3 (three) times daily as needed for muscle spasms.  Dispense: 30 capsule; Refill: 0 - predniSONE (DELTASONE) 10 MG tablet; 6 tablets for 1 day, then 5 for 1 day, then 4 for 1 day, then 3 for 1 day, then 2 for 1 day then 1 for 1 day.  Dispense: 21 tablet; Refill: 0  Call if symptoms change or if not rapidly improving.          Lelon Huh, MD  [facility]

## 2018-03-03 NOTE — Telephone Encounter (Signed)
Returned call to patient to see what he was needing. Patient stated he called to schedule an ov with Dr. Caryn Section. Patient advised Caryn Section is out of office this afternoon. Scheduled pt for appt tomorrow at 3:20 pm.

## 2018-03-03 NOTE — Progress Notes (Signed)
Remote ICD transmission.   

## 2018-03-04 ENCOUNTER — Encounter: Payer: Self-pay | Admitting: Family Medicine

## 2018-03-04 ENCOUNTER — Ambulatory Visit (INDEPENDENT_AMBULATORY_CARE_PROVIDER_SITE_OTHER): Payer: Medicaid Other | Admitting: Family Medicine

## 2018-03-04 VITALS — BP 125/86 | HR 68 | Temp 97.6°F | Resp 16 | Ht 70.0 in | Wt 189.0 lb

## 2018-03-04 DIAGNOSIS — S39012A Strain of muscle, fascia and tendon of lower back, initial encounter: Secondary | ICD-10-CM | POA: Diagnosis not present

## 2018-03-04 MED ORDER — TIZANIDINE HCL 4 MG PO CAPS: 4.0000 mg | ORAL_CAPSULE | Freq: Three times a day (TID) | ORAL | 0 refills | Status: DC | PRN

## 2018-03-04 MED ORDER — PREDNISONE 10 MG PO TABS: ORAL_TABLET | ORAL | 0 refills | Status: DC

## 2018-03-10 ENCOUNTER — Other Ambulatory Visit: Payer: Self-pay

## 2018-03-10 DIAGNOSIS — M5489 Other dorsalgia: Secondary | ICD-10-CM

## 2018-03-11 ENCOUNTER — Telehealth: Payer: Self-pay | Admitting: Family Medicine

## 2018-03-11 ENCOUNTER — Telehealth: Payer: Self-pay

## 2018-03-11 MED ORDER — HYDROCODONE-ACETAMINOPHEN 10-325 MG PO TABS: 1.0000 | ORAL_TABLET | Freq: Three times a day (TID) | ORAL | 0 refills | Status: DC | PRN

## 2018-03-11 NOTE — Telephone Encounter (Signed)
Pt states he got shock by his ICD. Pt states he did not feel any symptoms. I requested him to send a manual transmission. I let him talk to the device tech nurse.

## 2018-03-11 NOTE — Telephone Encounter (Signed)
Remote transmission reviewed. Presenting rhythm: Vs. No ventricular arrhythmias recorded since last transmission (03/03/18).  Stable lead measurements. Stable thoracic impedance. Normal device function.  Informed patient that he did not receive an ICD shock. Patient verbalized understanding.

## 2018-03-11 NOTE — Telephone Encounter (Signed)
Please advise 

## 2018-03-11 NOTE — Telephone Encounter (Signed)
Pt called back saying he is still  Having muscle spasms.  Pain med is not helping.  Please advise  CB#[phone]  Thanks teri

## 2018-03-11 NOTE — Telephone Encounter (Signed)
Need to check renal panel, magnesium, and creatinine kinase for muscle pains.

## 2018-03-14 NOTE — Telephone Encounter (Signed)
Patient advised as below and states he doesn't think he needs labs. He has scheduled an appointment to come in tomorrow at 10:20am to discuss his back pain along with problems of insomnia.

## 2018-03-15 ENCOUNTER — Ambulatory Visit (INDEPENDENT_AMBULATORY_CARE_PROVIDER_SITE_OTHER): Payer: Medicaid Other | Admitting: Family Medicine

## 2018-03-15 ENCOUNTER — Encounter: Payer: Self-pay | Admitting: Family Medicine

## 2018-03-15 VITALS — BP 92/62 | HR 57 | Temp 97.5°F | Resp 16 | Wt 187.0 lb

## 2018-03-15 DIAGNOSIS — S39012D Strain of muscle, fascia and tendon of lower back, subsequent encounter: Secondary | ICD-10-CM | POA: Diagnosis not present

## 2018-03-15 DIAGNOSIS — M47816 Spondylosis without myelopathy or radiculopathy, lumbar region: Secondary | ICD-10-CM

## 2018-03-15 DIAGNOSIS — G4700 Insomnia, unspecified: Secondary | ICD-10-CM | POA: Diagnosis not present

## 2018-03-15 MED ORDER — ALLOPURINOL 100 MG PO TABS: 100.0000 mg | ORAL_TABLET | Freq: Every day | ORAL | 5 refills | Status: DC

## 2018-03-15 MED ORDER — PREDNISONE 10 MG PO TABS: ORAL_TABLET | ORAL | 0 refills | Status: AC

## 2018-03-15 MED ORDER — TIZANIDINE HCL 4 MG PO CAPS: 4.0000 mg | ORAL_CAPSULE | Freq: Three times a day (TID) | ORAL | 0 refills | Status: DC | PRN

## 2018-03-15 MED ORDER — FUROSEMIDE 40 MG PO TABS: 40.0000 mg | ORAL_TABLET | Freq: Every day | ORAL | 6 refills | Status: DC | PRN

## 2018-03-15 NOTE — Progress Notes (Signed)
Patient: Richard Cook Male    DOB: [birth year]   62 y.o.   MRN: [id number] Visit Date: 03/15/2018  Today's Provider: Lelon Huh, MD   Chief Complaint  Patient presents with  . Back Pain   Subjective:    HPI  Back strain, initial encounter From 03/04/2018-started on tiZANidine (ZANAFLEX) 4 MG capsule and given rx for 6 days  predniSONE (DELTASONE) 10 MG tablet taper. He states pain improved 90% the first few days after starting prednisone. Finished tizanidine last night which did help quite a bit and helped him sleep much better.   He states he has had to have epidural injections a few times in the past, which were the only thing that helped with his back. He called pain clinic to schedule an appointment, but states they required a referral.    Allergies  Allergen Reactions  . Codeine Other (See Comments)    Tolerates Hydrocodone (??)  . Colchicine Nausea And Vomiting     Current Outpatient Medications:  .  amiodarone (PACERONE) 200 MG tablet, Take 1 tablet (200 mg total) by mouth 2 (two) times daily., Disp: 60 tablet, Rfl: 6 .  aspirin EC 81 MG tablet, Take 81 mg by mouth daily., Disp: , Rfl:  .  BEE POLLEN PO, Take 1 capsule by mouth daily. , Disp: , Rfl:  .  carvedilol (COREG) 3.125 MG tablet, Take 1 tablet (3.125 mg total) by mouth 2 (two) times daily., Disp: 60 tablet, Rfl: 5 .  clonazePAM (KLONOPIN) 1 MG tablet, Take 1 tablet (1 mg total) by mouth 2 (two) times daily., Disp: 60 tablet, Rfl: 1 .  furosemide (LASIX) 40 MG tablet, Take 1 tablet (40 mg total) by mouth daily as needed for fluid., Disp: 30 tablet, Rfl: 6 .  furosemide (LASIX) 40 MG tablet, Take 1 tablet (40 mg total) by mouth daily. For the next 3 days, take 80mg  per day., Disp: 45 tablet, Rfl: 0 .  HYDROcodone-acetaminophen (NORCO) 10-325 MG tablet, Take 1 tablet by mouth every 8 (eight) hours as needed., Disp: 60 tablet, Rfl: 0 .  ketoconazole (NIZORAL) 2 % cream, Apply 1 application topically  daily., Disp: 15 g, Rfl: 0 .  mexiletine (MEXITIL) 200 MG capsule, Take 1 capsule (200 mg total) by mouth every 12 (twelve) hours., Disp: 60 capsule, Rfl: 6 .  Omega-3 Fatty Acids (FISH OIL PO), Take 1 capsule by mouth daily., Disp: , Rfl:  .  tiZANidine (ZANAFLEX) 4 MG capsule, Take 1 capsule (4 mg total) by mouth 3 (three) times daily as needed for muscle spasms. (Patient not taking: Reported on 03/15/2018), Disp: 30 capsule, Rfl: 0 .  zolpidem (AMBIEN) 10 MG tablet, Take 1 tablet (10 mg total) by mouth at bedtime as needed for sleep., Disp: 30 tablet, Rfl: 3  Review of Systems  Constitutional: Negative for appetite change, chills and fever.  Respiratory: Negative for chest tightness, shortness of breath (when climbing stairs) and wheezing.   Cardiovascular: Negative for chest pain and palpitations.  Gastrointestinal: Negative for abdominal pain, nausea and vomiting.  Musculoskeletal: Positive for back pain and myalgias.    Social History   Tobacco Use  . Smoking status: Never Smoker  . Smokeless tobacco: Never Used  Substance Use Topics  . Alcohol use: No    Comment: 10/07/2016 "quit in the early 1990s"   Objective:   BP (!) 70/56 (BP Location: Right Arm, Patient Position: Sitting, Cuff Size: Large)   Pulse (!) 57  Temp (!) 97.5 F (36.4 C) (Oral)   Resp 16   Wt 187 lb (84.8 kg)   SpO2 96% Comment: room air  BMI 26.83 kg/m  Vitals:   03/15/18 1033 03/15/18 1040 03/15/18 1041 03/15/18 1109  BP: (!) 81/59 (!) 72/56 (!) 70/56 92/62  Pulse: (!) 57     Resp: 16     Temp: (!) 97.5 F (36.4 C)     TempSrc: Oral     SpO2: 96%     Weight: 187 lb (84.8 kg)        Physical Exam   General Appearance:    Alert, cooperative, no distress  Eyes:    PERRL, conjunctiva/corneas clear, EOM's intact       Lungs:     Clear to auscultation bilaterally, respirations unlabored  Heart:    Regular rate and rhythm  MS:   Tender thoracic spine with spasm and tenderenss noted along right para  thoracic and para lumbar muscles.           Assessment & Plan:     1. Back strain, subsequent encounter He reports significant, but temporary relief when he was taking prednisone and tizanidine. Refilled these today.  - predniSONE (DELTASONE) 10 MG tablet; 6 tablets for 2 days, then 5 for 2 days, then 4 for 2 days, then 3 for 2 days, then 2 for 2 days, then 1 for 2 days.  Dispense: 42 tablet; Refill: 0 - tiZANidine (ZANAFLEX) 4 MG capsule; Take 1 capsule (4 mg total) by mouth 3 (three) times daily as needed for muscle spasms.  Dispense: 60 capsule; Refill: 0  2. Lumbar spondylosis (L4-5 and L5-S1 bulging disks) He states he has required epidural injections in the past and requests evaluation at pain clinic in hopes of having injection.   3. Lumbar facet syndrome (Location of Primary Source of Pain) (Bilateral) (L>R)   4. Insomnia, unspecified type Have tried several medications, but reports the only one that has helped has been the tizanidine. Advised it is permissible to take one of these to help him sleep better.   He is noted to be relatively hypotensive today. He denies any light headedness, dizziness, or other sx of orthostasis. Normal rate and rhythm by auscultation. Expect some increase in BP with prednisone prescribed today. Follow up cardiology as scheduled. Call if any dizziness or palpations.       Lelon Huh, MD  [facility]

## 2018-03-17 ENCOUNTER — Ambulatory Visit (INDEPENDENT_AMBULATORY_CARE_PROVIDER_SITE_OTHER): Payer: Medicaid Other | Admitting: Internal Medicine

## 2018-03-17 ENCOUNTER — Encounter: Payer: Self-pay | Admitting: Internal Medicine

## 2018-03-17 VITALS — BP 92/68 | HR 50 | Ht 70.0 in | Wt 187.6 lb

## 2018-03-17 DIAGNOSIS — I255 Ischemic cardiomyopathy: Secondary | ICD-10-CM

## 2018-03-17 DIAGNOSIS — Z95810 Presence of automatic (implantable) cardiac defibrillator: Secondary | ICD-10-CM

## 2018-03-17 DIAGNOSIS — I472 Ventricular tachycardia, unspecified: Secondary | ICD-10-CM

## 2018-03-17 DIAGNOSIS — N179 Acute kidney failure, unspecified: Secondary | ICD-10-CM

## 2018-03-17 DIAGNOSIS — I5042 Chronic combined systolic (congestive) and diastolic (congestive) heart failure: Secondary | ICD-10-CM | POA: Diagnosis not present

## 2018-03-17 LAB — CUP PACEART INCLINIC DEVICE CHECK
Battery Remaining Longevity: 69 mo
Battery Voltage: 3 V
HIGH POWER IMPEDANCE MEASURED VALUE: 87 Ohm
Implantable Lead Implant Date: 20150318
Implantable Lead Model: 181
Implantable Lead Serial Number: 327195
Implantable Pulse Generator Implant Date: 20150318
Lead Channel Impedance Value: 608 Ohm
Lead Channel Impedance Value: 646 Ohm
Lead Channel Pacing Threshold Amplitude: 1 V
Lead Channel Pacing Threshold Pulse Width: 0.4 ms
Lead Channel Sensing Intrinsic Amplitude: 9 mV
Lead Channel Sensing Intrinsic Amplitude: 9.875 mV
Lead Channel Setting Pacing Amplitude: 2 V
Lead Channel Setting Pacing Pulse Width: 0.4 ms
Lead Channel Setting Sensing Sensitivity: 0.3 mV
MDC IDC LEAD LOCATION: 753860
MDC IDC SESS DTM: 20190815162717
MDC IDC STAT BRADY RV PERCENT PACED: 1.28 %

## 2018-03-17 MED ORDER — AMIODARONE HCL 200 MG PO TABS: 400.0000 mg | ORAL_TABLET | Freq: Every day | ORAL | 6 refills | Status: DC
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
## 2018-03-17 NOTE — Patient Instructions (Signed)
Medication Instructions:  Your physician has recommended you make the following change in your medication:   1. Stop Carvedilol 2. Begin taking your Amiodarone, 400mg , 2 tablets, in the morning  Labwork: You will have labs drawn today: CBC, BMP, LFTs, TSH  Testing/Procedures: None ordered.  Follow-Up: Your physician wants you to follow-up in: 3 months with Chanetta Marshall.   Remote monitoring is used to monitor your ICD from home. This monitoring reduces the number of office visits required to check your device to one time per year. It allows [place] to keep an eye on the functioning of your device to ensure it is working properly. You are scheduled for a device check from home on 06/02/2018. You may send your transmission at any time that day. If you have a wireless device, the transmission will be sent automatically. After your physician reviews your transmission, you will receive a postcard with your next transmission date.    Any Other Special Instructions Will Be Listed Below (If Applicable).   We have sent in a referral to our [facility] with Dr Haroldine Laws.   If you need a refill on your cardiac medications before your next appointment, please call your pharmacy.

## 2018-03-18 LAB — HEPATIC FUNCTION PANEL
ALBUMIN: 3.8 g/dL (ref 3.6–4.8)
ALK PHOS: 91 IU/L (ref 39–117)
ALT: 51 IU/L — ABNORMAL HIGH (ref 0–44)
AST: 41 IU/L — ABNORMAL HIGH (ref 0–40)
BILIRUBIN TOTAL: 0.6 mg/dL (ref 0.0–1.2)
Bilirubin, Direct: 0.16 mg/dL (ref 0.00–0.40)
TOTAL PROTEIN: 6.4 g/dL (ref 6.0–8.5)

## 2018-03-18 LAB — CBC
HEMOGLOBIN: 14.9 g/dL (ref 13.0–17.7)
Hematocrit: 44.4 % (ref 37.5–51.0)
MCH: 31.5 pg (ref 26.6–33.0)
MCHC: 33.6 g/dL (ref 31.5–35.7)
MCV: 94 fL (ref 79–97)
Platelets: 189 10*3/uL (ref 150–450)
RBC: 4.73 x10E6/uL (ref 4.14–5.80)
RDW: 14.6 % (ref 12.3–15.4)
WBC: 11.8 10*3/uL — AB (ref 3.4–10.8)

## 2018-03-18 LAB — BASIC METABOLIC PANEL
BUN/Creatinine Ratio: 14 (ref 10–24)
BUN: 56 mg/dL — ABNORMAL HIGH (ref 8–27)
CALCIUM: 8.9 mg/dL (ref 8.6–10.2)
CO2: 21 mmol/L (ref 20–29)
CREATININE: 4.06 mg/dL — AB (ref 0.76–1.27)
Chloride: 101 mmol/L (ref 96–106)
GFR calc Af Amer: 17 mL/min/{1.73_m2} — ABNORMAL LOW (ref >59)
GFR calc non Af Amer: 15 mL/min/{1.73_m2} — ABNORMAL LOW (ref >59)
GLUCOSE: 107 mg/dL — AB (ref 65–99)
Potassium: 4.7 mmol/L (ref 3.5–5.2)
SODIUM: 140 mmol/L (ref 134–144)

## 2018-03-18 LAB — TSH: TSH: 5.34 u[IU]/mL — ABNORMAL HIGH (ref 0.450–4.500)

## 2018-03-21 ENCOUNTER — Other Ambulatory Visit: Payer: Self-pay

## 2018-03-21 DIAGNOSIS — F411 Generalized anxiety disorder: Secondary | ICD-10-CM

## 2018-03-21 MED ORDER — CLONAZEPAM 1 MG PO TABS: 1.0000 mg | ORAL_TABLET | Freq: Two times a day (BID) | ORAL | 3 refills | Status: DC

## 2018-03-22 ENCOUNTER — Telehealth: Payer: Self-pay

## 2018-03-22 ENCOUNTER — Telehealth (HOSPITAL_COMMUNITY): Payer: Self-pay | Admitting: Vascular Surgery

## 2018-03-22 DIAGNOSIS — R748 Abnormal levels of other serum enzymes: Secondary | ICD-10-CM

## 2018-03-22 NOTE — Addendum Note (Signed)
Addended by: Dollene Primrose on: 03/22/2018 09:33 AM   Modules accepted: Orders

## 2018-03-22 NOTE — Telephone Encounter (Signed)
-----   Message from [name], MD sent at 03/18/2018  4:59 PM EDT ----- Please Inform Patient that labs are mostly better  Cr still at 4.0 but a little lower than last time TSH mildly elevated but also better than 3 months ago  His liver tests are elevated again and this may be related to [facility]   We will recheck in 8 weeks and then make a decision about amio dosing  Thanks

## 2018-03-22 NOTE — Telephone Encounter (Signed)
Left pt message to make new chf appt w/ dm/db first ava is fine

## 2018-03-22 NOTE — Telephone Encounter (Signed)
Pt aware of lab results. Will return in 8 weeks to re draw LFT's for amio surveillance.

## 2018-03-28 ENCOUNTER — Ambulatory Visit
Admission: RE | Admit: 2018-03-28 | Discharge: 2018-03-28 | Disposition: A | Discharge status: Home / Self Care | Payer: Medicaid Other | Source: Ambulatory Visit | Attending: Nurse Practitioner | Admitting: Nurse Practitioner

## 2018-03-28 ENCOUNTER — Encounter: Payer: Self-pay | Admitting: Nurse Practitioner

## 2018-03-28 ENCOUNTER — Ambulatory Visit: Payer: Medicaid Other | Admitting: Nurse Practitioner

## 2018-03-28 ENCOUNTER — Other Ambulatory Visit: Payer: Self-pay

## 2018-03-28 VITALS — BP 120/92 | HR 76 | Temp 97.9°F | Ht 70.0 in | Wt 181.0 lb

## 2018-03-28 DIAGNOSIS — Z95810 Presence of automatic (implantable) cardiac defibrillator: Secondary | ICD-10-CM | POA: Diagnosis not present

## 2018-03-28 DIAGNOSIS — M549 Dorsalgia, unspecified: Secondary | ICD-10-CM | POA: Diagnosis present

## 2018-03-28 DIAGNOSIS — Z86718 Personal history of other venous thrombosis and embolism: Secondary | ICD-10-CM | POA: Diagnosis not present

## 2018-03-28 DIAGNOSIS — G894 Chronic pain syndrome: Secondary | ICD-10-CM

## 2018-03-28 DIAGNOSIS — F321 Major depressive disorder, single episode, moderate: Secondary | ICD-10-CM | POA: Insufficient documentation

## 2018-03-28 DIAGNOSIS — Z79899 Other long term (current) drug therapy: Secondary | ICD-10-CM | POA: Diagnosis not present

## 2018-03-28 DIAGNOSIS — I5022 Chronic systolic (congestive) heart failure: Secondary | ICD-10-CM | POA: Diagnosis not present

## 2018-03-28 DIAGNOSIS — M79605 Pain in left leg: Secondary | ICD-10-CM | POA: Diagnosis not present

## 2018-03-28 DIAGNOSIS — M545 Low back pain: Secondary | ICD-10-CM | POA: Insufficient documentation

## 2018-03-28 DIAGNOSIS — G8929 Other chronic pain: Secondary | ICD-10-CM

## 2018-03-28 DIAGNOSIS — I2510 Atherosclerotic heart disease of native coronary artery without angina pectoris: Secondary | ICD-10-CM | POA: Insufficient documentation

## 2018-03-28 DIAGNOSIS — F419 Anxiety disorder, unspecified: Secondary | ICD-10-CM | POA: Diagnosis not present

## 2018-03-28 DIAGNOSIS — Z951 Presence of aortocoronary bypass graft: Secondary | ICD-10-CM | POA: Diagnosis not present

## 2018-03-28 DIAGNOSIS — M546 Pain in thoracic spine: Secondary | ICD-10-CM

## 2018-03-28 DIAGNOSIS — M47816 Spondylosis without myelopathy or radiculopathy, lumbar region: Secondary | ICD-10-CM | POA: Insufficient documentation

## 2018-03-28 DIAGNOSIS — M7918 Myalgia, other site: Secondary | ICD-10-CM

## 2018-03-28 DIAGNOSIS — Z7982 Long term (current) use of aspirin: Secondary | ICD-10-CM | POA: Diagnosis not present

## 2018-03-28 DIAGNOSIS — Z885 Allergy status to narcotic agent status: Secondary | ICD-10-CM | POA: Diagnosis not present

## 2018-03-28 DIAGNOSIS — N183 Chronic kidney disease, stage 3 (moderate): Secondary | ICD-10-CM | POA: Diagnosis not present

## 2018-03-28 DIAGNOSIS — Z7952 Long term (current) use of systemic steroids: Secondary | ICD-10-CM | POA: Insufficient documentation

## 2018-03-28 NOTE — Patient Instructions (Signed)

## 2018-03-28 NOTE — Progress Notes (Signed)
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 Abdominal pain 02/28/2014  . Cellulitis of leg 04/11/2015  . Chronic low back pain (Location of Primary Source of Pain) (Left) 01/13/2016  . Cellulitis of groin 01/13/2016  . Fatigue 01/13/2016  . Severe sepsis ([place]) 01/12/2009  . H/O acute myocardial infarction 08/13/2011  . 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 Feeling bilious 01/13/2016  . Cutaneous eruption 01/13/2016  . Abdominal pain, suprapubic 01/13/2016  . Open leg wound 01/13/2016  . Gout, renal disease 01/13/2016  . Elevated C-reactive protein (CRP) 01/20/2016  . Elevated sedimentation rate 01/20/2016  . Acute low back pain 03/02/2016  . Cellulitis 04/01/2016  . Renal failure 05/12/2016  . Acute on chronic combined systolic and diastolic congestive heart failure ([place]) 05/12/2016   Past Medical History:  Diagnosis Date  . AICD (automatic cardioverter/defibrillator) present   . Allergic contact dermatitis 01/13/2016  . Anxiety   . Arthralgia 03/29/2015  . Back pain 01/13/2016  . Bulging lumbar disc   . Cellulitis and abscess 03/2013  . Chronic back pain 10/16/2015  . CKD  (chronic kidney disease), stage III ([place])   . DVT (deep venous thrombosis) ([place])   . History of blood transfusion 1986  . History of gout   . HLD (hyperlipidemia)   . Hypertension   . Ischemic cardiomyopathy   . Kidney failure 01/13/2016  . Leg pain 01/13/2016  . Myocardial infarction ([place]) 2013  . Radiculopathy of lumbar region 03/29/2015  . Rheumatoid arthritis ([place])   . Sepsis ([place]) 02/22/2015  . Sleep apnea   . Tick-borne fever 01/12/2009  . Ventricular tachycardia ([place])    Constitutional Exam  General appearance: Well nourished, well developed, and well hydrated. In no apparent acute distress Vitals:   03/28/18 1301  BP: (!) 120/92  Pulse: 76  Temp: 97.9 F (36.6 C)  SpO2: 100%  Weight: 181 lb (82.1 kg)  Height: _0  (1.778 m)   BMI Assessment: Estimated body mass index is 25.97 kg/m as calculated from the following:  Height as of this encounter: _0  (1.778 m).   Weight as of this encounter: 181 lb (82.1 kg).  BMI interpretation table: BMI level Category Range association with higher incidence of chronic pain  <18 kg/m2 Underweight   18.5-24.9 kg/m2 Ideal body weight   25-29.9 kg/m2 Overweight Increased incidence by 20%  30-34.9 kg/m2 Obese (Class I) Increased incidence by 68%  35-39.9 kg/m2 Severe obesity (Class II) Increased incidence by 136%  >40 kg/m2 Extreme obesity (Class III) Increased incidence by 254%   BMI Readings from Last 4 Encounters:  03/28/18 25.97 kg/m  03/17/18 26.92 kg/m  03/15/18 26.83 kg/m  03/04/18 27.12 kg/m   Wt Readings from Last 4 Encounters:  03/28/18 181 lb (82.1 kg)  03/17/18 187 lb 9.6 oz (85.1 kg)  03/15/18 187 lb (84.8 kg)  03/04/18 189 lb (85.7 kg)  Psych/Mental status: Alert, oriented x 3 (person, place, & time)       Eyes: PERLA Respiratory: No evidence of acute respiratory distress  Cervical Spine Exam  Inspection: No masses, redness, or swelling Alignment: Symmetrical Functional ROM: Unrestricted ROM       Stability: No instability detected Muscle strength & Tone: Functionally intact Sensory: Unimpaired Palpation: No palpable anomalies              Upper Extremity (UE) Exam    Side: Right upper extremity  Side: Left upper extremity  Inspection: No masses, redness, swelling, or asymmetry. No contractures  Inspection: No masses, redness, swelling, or asymmetry. No contractures  Functional ROM: Unrestricted ROM          Functional ROM: Unrestricted ROM          Muscle strength & Tone: Functionally intact  Muscle strength & Tone: Functionally intact  Sensory: Unimpaired  Sensory: Unimpaired  Palpation: No palpable anomalies              Palpation: No palpable anomalies              Specialized Test(s): Deferred         Specialized Test(s): Deferred          Thoracic Spine Exam  Inspection: No masses, redness, or swelling Alignment: Symmetrical Functional ROM: Unrestricted ROM Stability: No instability detected Sensory: Unimpaired Muscle strength & Tone: No palpable anomalies  Lumbar Spine Exam  Inspection: No masses, redness, or swelling Alignment: Symmetrical Functional ROM: Unrestricted ROM      Stability: No instability detected Muscle strength & Tone: Functionally intact Sensory: Unimpaired Palpation: No palpable anomalies       Provocative Tests: Lumbar Hyperextension and rotation test: evaluation deferred today       Patrick's Maneuver: evaluation deferred today                    Gait & Posture Assessment  Ambulation: Unassisted Gait: Relatively normal for age and body habitus Posture: WNL   Lower Extremity Exam    Side: Right lower extremity  Side: Left lower extremity  Inspection: No masses, redness, swelling, or asymmetry. No contractures  Inspection: No masses, redness, swelling, or asymmetry. No contractures  Functional ROM: Unrestricted ROM          Functional ROM: Unrestricted ROM          Muscle strength & Tone: Functionally intact  Muscle strength & Tone:  Functionally intact  Sensory: Unimpaired  Sensory: Unimpaired  Palpation: No palpable anomalies  Palpation: No palpable anomalies   Assessment  Primary Diagnosis & Pertinent Problem List: The primary encounter  diagnosis was Chronic midline thoracic back pain (Primary Area of Pain) (R>L). Diagnoses of Myofascial pain and Chronic pain syndrome were also pertinent to this visit.  Visit Diagnosis: 1. Chronic midline thoracic back pain (Primary Area of Pain) (R>L)   2. Myofascial pain   3. Chronic pain syndrome    Plan of Care  Initial treatment plan:  Please be advised that as per protocol, today's visit has been an evaluation only. We have not taken over the patient's controlled substance management.  Problem-specific plan: No problem-specific Assessment & Plan notes found for this encounter.  Ordered Lab-work, Procedure(s), Referral(s), & Consult(s): Orders Placed This Encounter  Procedures  . TRIGGER POINT INJECTION  . THORACIC FACET BLOCK  . DG Thoracic Spine 2 View   Pharmacotherapy: Medications ordered:  No orders of the defined types were placed in this encounter.  Medications administered during this visit: Elwin Sleight had no medications administered during this visit.   Pharmacotherapy under consideration:  Opioid Analgesics: The patient was informed that there is no guarantee that he would be a candidate for opioid analgesics. The decision will be made following CDC guidelines. This decision will be based on the results of diagnostic studies, as well as Mr. Delap's risk profile.  Membrane stabilizer: To be determined at a later time Muscle relaxant: To be determined at a later time NSAID: To be determined at a later time Other analgesic(s): To be determined at a later time   Interventional therapies under consideration: Mr. Bacallao was informed that there is no guarantee that he would be a candidate for interventional therapies. The decision will be based on the  results of diagnostic studies, as well as Mr. Jasmer's risk profile.  Possible procedure(s): Diagnostic trigger point injections Diagnostic midline thoracic facet nerve block Possible midline thoracic facet radiofrequency ablation   PRN Procedures:   1. Palliative left-sided L4-5 lumbar epidural steroid injection under fluoroscopic guidance, no sedation.  2. Palliative trigger point injections.     Provider-requested follow-up: Return for Procedure(w/Sedation), w/ Dr. Dossie Arbour.  Future Appointments  Date Time Provider [place]  03/29/2018  1:15 PM Milinda Pointer, MD [facility] None  05/24/2018  8:15 AM [facility]  06/02/2018  7:50 AM [facility] DEVICE REMOTES [facility]    Primary Care Physician: Birdie Sons, MD Location: [facility] Note by:  Date: 03/28/2018; Time: 2:34 PM  Pain Score Disclaimer: We use the NRS-11 scale. This is a self-reported, subjective measurement of pain severity with only modest accuracy. It is used primarily to identify changes within a particular patient. It must be understood that outpatient pain scales are significantly less accurate that those used for research, where they can be applied under ideal controlled circumstances with minimal exposure to variables. In reality, the score is likely to be a combination of pain intensity and pain affect, where pain affect describes the degree of emotional arousal or changes in action readiness caused by the sensory experience of pain. Factors such as social and work situation, setting, emotional state, anxiety levels, expectation, and prior pain experience may influence pain perception and show large inter-individual differences that may also be affected by time variables.  Patient instructions provided during this appointment: Patient Instructions    ____________________________________________________________________________________________  Appointment Policy Summary  It is our goal and responsibility to provide the medical community with assistance in the evaluation and management of patients with chronic pain. Unfortunately our resources are limited. Because we do not have an unlimited amount  of time, or available appointments, we are required to closely monitor and manage their use. The following rules exist to maximize their use:  Patient's responsibilities: 1. Punctuality:  At what time should I arrive? You should be physically present in our office 30 minutes before your scheduled appointment. Your scheduled appointment is with your assigned healthcare provider. However, it takes 5-10 minutes to be "checked-in", and another 15 minutes for the nurses to do the admission. If you arrive to our office at the time you were given for your appointment, you will end up being at least 20-25 minutes late to your appointment with the provider. 2. Tardiness:  What happens if I arrive only a few minutes after my scheduled appointment time? You will need to reschedule your appointment. The cutoff is your appointment time. This is why it is so important that you arrive at least 30 minutes before that appointment. If you have an appointment scheduled for 10:00 AM and you arrive at 10:01, you will be required to reschedule your appointment.  3. Plan ahead:  Always assume that you will encounter traffic on your way in. Plan for it. If you are dependent on a driver, make sure they understand these rules and the need to arrive early. 4. Other appointments and responsibilities:  Avoid scheduling any other appointments before or after your pain clinic appointments.  5. Be prepared:  Write down everything that you need to discuss with your healthcare provider and give this information to the admitting nurse. Write down the medications that you will need  refilled. Bring your pills and bottles (even the empty ones), to all of your appointments, except for those where a procedure is scheduled. 6. No children or pets:  Find someone to take care of them. It is not appropriate to bring them in. 7. Scheduling changes:  We request "advanced notification" of any changes or cancellations. 8. Advanced notification:  Defined as a time period of more than 24 hours prior to the originally scheduled appointment. This allows for the appointment to be offered to other patients. 9. Rescheduling:  When a visit is rescheduled, it will require the cancellation of the original appointment. For this reason they both fall within the category of "Cancellations".  10. Cancellations:  They require advanced notification. Any cancellation less than 24 hours before the  appointment will be recorded as a "No Show". 11. No Show:  Defined as an unkept appointment where the patient failed to notify or declare to the practice their intention or inability to keep the appointment.  Corrective process for repeat offenders:  1. Tardiness: Three (3) episodes of rescheduling due to late arrivals will be recorded as one (1) "No Show". 2. Cancellation or reschedule: Three (3) cancellations or rescheduling will be recorded as one (1) "No Show". 3. "No Shows": Three (3) "No Shows" within a 12 month period will result in discharge from the practice. ____________________________________________________________________________________________  ____________________________________________________________________________________________  Pain Scale  Introduction: The pain score used by this practice is the Verbal Numerical Rating Scale (VNRS-11). This is an 11-point scale. It is for adults and children 10 years or older. There are significant differences in how the pain score is reported, used, and applied. Forget everything you learned in the past and learn this scoring system.  General  Information: The scale should reflect your current level of pain. Unless you are specifically asked for the level of your worst pain, or your average pain. If you are asked for one of these two, then  it should be understood that it is over the past 24 hours.  Basic Activities of Daily Living (ADL): Personal hygiene, dressing, eating, transferring, and using restroom.  Instructions: Most patients tend to report their level of pain as a combination of two factors, their physical pain and their psychosocial pain. This last one is also known as "suffering" and it is reflection of how physical pain affects you socially and psychologically. From now on, report them separately. From this point on, when asked to report your pain level, report only your physical pain. Use the following table for reference.  Pain Clinic Pain Levels (0-5/10)  Pain Level Score  Description  No Pain 0   Mild pain 1 Nagging, annoying, but does not interfere with basic activities of daily living (ADL). Patients are able to eat, bathe, get dressed, toileting (being able to get on and off the toilet and perform personal hygiene functions), transfer (move in and out of bed or a chair without assistance), and maintain continence (able to control bladder and bowel functions). Blood pressure and heart rate are unaffected. A normal heart rate for a healthy adult ranges from 60 to 100 bpm (beats per minute).   Mild to moderate pain 2 Noticeable and distracting. Impossible to hide from other people. More frequent flare-ups. Still possible to adapt and function close to normal. It can be very annoying and may have occasional stronger flare-ups. With discipline, patients may get used to it and adapt.   Moderate pain 3 Interferes significantly with activities of daily living (ADL). It becomes difficult to feed, bathe, get dressed, get on and off the toilet or to perform personal hygiene functions. Difficult to get in and out of bed or a chair  without assistance. Very distracting. With effort, it can be ignored when deeply involved in activities.   Moderately severe pain 4 Impossible to ignore for more than a few minutes. With effort, patients may still be able to manage work or participate in some social activities. Very difficult to concentrate. Signs of autonomic nervous system discharge are evident: dilated pupils (mydriasis); mild sweating (diaphoresis); sleep interference. Heart rate becomes elevated (>115 bpm). Diastolic blood pressure (lower number) rises above 100 mmHg. Patients find relief in laying down and not moving.   Severe pain 5 Intense and extremely unpleasant. Associated with frowning face and frequent crying. Pain overwhelms the senses.  Ability to do any activity or maintain social relationships becomes significantly limited. Conversation becomes difficult. Pacing back and forth is common, as getting into a comfortable position is nearly impossible. Pain wakes you up from deep sleep. Physical signs will be obvious: pupillary dilation; increased sweating; goosebumps; brisk reflexes; cold, clammy hands and feet; nausea, vomiting or dry heaves; loss of appetite; significant sleep disturbance with inability to fall asleep or to remain asleep. When persistent, significant weight loss is observed due to the complete loss of appetite and sleep deprivation.  Blood pressure and heart rate becomes significantly elevated. Caution: If elevated blood pressure triggers a pounding headache associated with blurred vision, then the patient should immediately seek attention at an urgent or emergency care unit, as these may be signs of an impending stroke.    Emergency Department Pain Levels (6-10/10)  Emergency Room Pain 6 Severely limiting. Requires emergency care and should not be seen or managed at an outpatient pain management facility. Communication becomes difficult and requires great effort. Assistance to reach the emergency department  may be required. Facial flushing and profuse sweating along with potentially  dangerous increases in heart rate and blood pressure will be evident.   Distressing pain 7 Self-care is very difficult. Assistance is required to transport, or use restroom. Assistance to reach the emergency department will be required. Tasks requiring coordination, such as bathing and getting dressed become very difficult.   Disabling pain 8 Self-care is no longer possible. At this level, pain is disabling. The individual is unable to do even the most "basic" activities such as walking, eating, bathing, dressing, transferring to a bed, or toileting. Fine motor skills are lost. It is difficult to think clearly.   Incapacitating pain 9 Pain becomes incapacitating. Thought processing is no longer possible. Difficult to remember your own name. Control of movement and coordination are lost.   The worst pain imaginable 10 At this level, most patients pass out from pain. When this level is reached, collapse of the autonomic nervous system occurs, leading to a sudden drop in blood pressure and heart rate. This in turn results in a temporary and dramatic drop in blood flow to the brain, leading to a loss of consciousness. Fainting is one of the body's self defense mechanisms. Passing out puts the brain in a calmed state and causes it to shut down for a while, in order to begin the healing process.    Summary: 1. Refer to this scale when providing [place] with your pain level. 2. Be accurate and careful when reporting your pain level. This will help with your care. 3. Over-reporting your pain level will lead to loss of credibility. 4. Even a level of 1/10 means that there is pain and will be treated at our facility. 5. High, inaccurate reporting will be documented as "Symptom Exaggeration", leading to loss of credibility and suspicions of possible secondary gains such as obtaining more narcotics, or wanting to appear disabled, for  fraudulent reasons. 6. Only pain levels of 5 or below will be seen at our facility. 7. Pain levels of 6 and above will be sent to the Emergency Department and the appointment cancelled. ____________________________________________________________________________________________

## 2018-03-29 ENCOUNTER — Ambulatory Visit (HOSPITAL_BASED_OUTPATIENT_CLINIC_OR_DEPARTMENT_OTHER): Payer: Medicaid Other | Admitting: Pain Medicine

## 2018-03-29 ENCOUNTER — Ambulatory Visit
Admission: RE | Admit: 2018-03-29 | Discharge: 2018-03-29 | Disposition: A | Discharge status: Home / Self Care | Payer: Medicaid Other | Source: Ambulatory Visit | Attending: Pain Medicine | Admitting: Pain Medicine

## 2018-03-29 ENCOUNTER — Other Ambulatory Visit: Payer: Self-pay

## 2018-03-29 ENCOUNTER — Encounter: Payer: Self-pay | Admitting: Pain Medicine

## 2018-03-29 VITALS — BP 136/92 | HR 52 | Temp 97.8°F | Resp 10 | Ht 70.0 in | Wt 181.0 lb

## 2018-03-29 DIAGNOSIS — M47894 Other spondylosis, thoracic region: Secondary | ICD-10-CM | POA: Insufficient documentation

## 2018-03-29 DIAGNOSIS — M546 Pain in thoracic spine: Secondary | ICD-10-CM

## 2018-03-29 DIAGNOSIS — M4724 Other spondylosis with radiculopathy, thoracic region: Secondary | ICD-10-CM | POA: Insufficient documentation

## 2018-03-29 DIAGNOSIS — G8929 Other chronic pain: Secondary | ICD-10-CM

## 2018-03-29 DIAGNOSIS — M5414 Radiculopathy, thoracic region: Secondary | ICD-10-CM | POA: Insufficient documentation

## 2018-03-29 DIAGNOSIS — M47814 Spondylosis without myelopathy or radiculopathy, thoracic region: Secondary | ICD-10-CM | POA: Insufficient documentation

## 2018-03-29 DIAGNOSIS — M549 Dorsalgia, unspecified: Secondary | ICD-10-CM

## 2018-03-29 DIAGNOSIS — G894 Chronic pain syndrome: Secondary | ICD-10-CM | POA: Diagnosis not present

## 2018-03-29 MED ORDER — LACTATED RINGERS IV SOLN: 1000.0000 mL | Freq: Once | INTRAVENOUS | Status: DC

## 2018-03-29 MED ORDER — SODIUM CHLORIDE 0.9 % IJ SOLN
INTRAMUSCULAR | Status: AC
  Filled 2018-03-29: qty 10

## 2018-03-29 MED ORDER — LIDOCAINE HCL 2 % IJ SOLN
20.0000 mL | Freq: Once | INTRAMUSCULAR | Status: AC
  Administered 2018-03-29: 400 mg
  Filled 2018-03-29: qty 40

## 2018-03-29 MED ORDER — ROPIVACAINE HCL 2 MG/ML IJ SOLN: 9.0000 mL | Freq: Once | INTRAMUSCULAR | Status: DC

## 2018-03-29 MED ORDER — SODIUM CHLORIDE 0.9% FLUSH
2.0000 mL | Freq: Once | INTRAVENOUS | Status: AC
  Administered 2018-03-29: 2 mL

## 2018-03-29 MED ORDER — MIDAZOLAM HCL 5 MG/5ML IJ SOLN: 1.0000 mg | INTRAMUSCULAR | Status: DC | PRN

## 2018-03-29 MED ORDER — DEXAMETHASONE SODIUM PHOSPHATE 10 MG/ML IJ SOLN
10.0000 mg | Freq: Once | INTRAMUSCULAR | Status: AC
  Administered 2018-03-29: 10 mg
  Filled 2018-03-29: qty 1

## 2018-03-29 MED ORDER — ROPIVACAINE HCL 2 MG/ML IJ SOLN
2.0000 mL | Freq: Once | INTRAMUSCULAR | Status: AC
  Administered 2018-03-29: 2 mL via EPIDURAL
  Filled 2018-03-29: qty 10

## 2018-03-29 MED ORDER — FENTANYL CITRATE (PF) 100 MCG/2ML IJ SOLN: 25.0000 ug | INTRAMUSCULAR | Status: DC | PRN

## 2018-03-29 MED ORDER — IOPAMIDOL (ISOVUE-M 200) INJECTION 41%
INTRAMUSCULAR | Status: AC
  Filled 2018-03-29: qty 10

## 2018-03-29 NOTE — Progress Notes (Signed)
Patient's Name: Richard Cook  MRN: [id number]  Referring Provider: Birdie Sons, MD  DOB: 28-Jan-1956  PCP: Birdie Sons, MD  DOS: 03/29/2018  Note by: Gaspar Cola, MD  Service setting: Ambulatory outpatient  Specialty: Interventional Pain Management  Patient type: Established  Location: [facility] (AMB) Pain Management Facility  Visit type: Interventional Procedure   Primary Reason for Visit: Interventional Pain Management Treatment. CC: Back Pain (middle)  Procedure:          Anesthesia, Analgesia, Anxiolysis:  Type: Diagnostic Inter-Laminar Thoracic Epidural Block #1  Region: Posterior Thoracolumbar Level: T11-12 Laterality: Right-Sided Paraspinal  Type: Moderate (Conscious) Sedation combined with Local Anesthesia Indication(s): Analgesia and Anxiety Route: Intravenous (IV) IV Access: Secured Sedation: Meaningful verbal contact was maintained at all times during the procedure  Local Anesthetic: Lidocaine 1-2%   Indications: 1. Thoracic spondylosis with radiculopathy   2. Chronic thoracic back pain (Primary Area of Pain) (Right)   3. Chronic upper back pain   4. Radicular pain of thoracic region   5. Thoracic radiculitis (Right)    Pain Score: Pre-procedure: 3 /10 Post-procedure: 0-No pain/10  Pre-op Assessment:  Richard Cook is a 62 y.o. (year old), male patient, seen today for interventional treatment. He  has a past surgical history that includes Cholecystectomy open (1980's); Skin graft (Left, 1986); Mandible fracture surgery (1986); Vascular surgery (Left, 1986); Tibia fracture surgery (Right, 1986); left heart catheterization with coronary angiogram (N/A, 08/10/2011); percutaneous coronary stent intervention (pci-s) (08/10/2011); left heart catheterization with coronary/graft angiogram (N/A, 10/17/2013); implantable cardioverter defibrillator implant (N/A, 10/18/2013); Split night study (12/19/2015); Fracture surgery; Inguinal hernia repair (Bilateral, ~ 08/2016);  Coronary angioplasty with stent (2013); Cardiac catheterization (2014); Ventricular ablation surgery (10/07/2016); Coronary artery bypass graft (2014); V TACH ABLATION (N/A, 10/07/2016); LEFT HEART CATH AND CORS/GRAFTS ANGIOGRAPHY (N/A, 11/17/2017); and V TACH ABLATION (N/A, 12/08/2017). Richard Cook has a current medication list which includes the following prescription(s): allopurinol, amiodarone, aspirin ec, bee pollen, clonazepam, furosemide, furosemide, hydrocodone-acetaminophen, omega-3 fatty acids, prednisone, and tizanidine. His primarily concern today is the Back Pain (middle)  Initial Vital Signs:  Pulse/HCG Rate: (!) 52ECG Heart Rate: (!) 50 Temp: 97.8 F (36.6 C) Resp: 14 BP: 116/81 SpO2: 100 %  BMI: Estimated body mass index is 25.97 kg/m as calculated from the following:   Height as of this encounter: 5\' 10"  (1.778 m).   Weight as of this encounter: 181 lb (82.1 kg).  Risk Assessment: Allergies: Reviewed. He is allergic to codeine and colchicine.  Allergy Precautions: None required Coagulopathies: Reviewed. None identified.  Blood-thinner therapy: None at this time Active Infection(s): Reviewed. None identified. Richard Cook is afebrile  Site Confirmation: Richard Cook was asked to confirm the procedure and laterality before marking the site Procedure checklist: Completed Consent: Before the procedure and under the influence of no sedative(s), amnesic(s), or anxiolytics, the patient was informed of the treatment options, risks and possible complications. To fulfill our ethical and legal obligations, as recommended by the American Medical Association's Code of Ethics, I have informed the patient of my clinical impression; the nature and purpose of the treatment or procedure; the risks, benefits, and possible complications of the intervention; the alternatives, including doing nothing; the risk(s) and benefit(s) of the alternative treatment(s) or procedure(s); and the risk(s) and  benefit(s) of doing nothing. The patient was provided information about the general risks and possible complications associated with the procedure. These may include, but are not limited to: failure to achieve desired goals, infection, bleeding, organ or  nerve damage, allergic reactions, paralysis, and death. In addition, the patient was informed of those risks and complications associated to Spine-related procedures, such as failure to decrease pain; infection (i.e.: Meningitis, epidural or intraspinal abscess); bleeding (i.e.: epidural hematoma, subarachnoid hemorrhage, or any other type of intraspinal or peri-dural bleeding); organ or nerve damage (i.e.: Any type of peripheral nerve, nerve root, or spinal cord injury) with subsequent damage to sensory, motor, and/or autonomic systems, resulting in permanent pain, numbness, and/or weakness of one or several areas of the body; allergic reactions; (i.e.: anaphylactic reaction); and/or death. Furthermore, the patient was informed of those risks and complications associated with the medications. These include, but are not limited to: allergic reactions (i.e.: anaphylactic or anaphylactoid reaction(s)); adrenal axis suppression; blood sugar elevation that in diabetics may result in ketoacidosis or comma; water retention that in patients with history of congestive heart failure may result in shortness of breath, pulmonary edema, and decompensation with resultant heart failure; weight gain; swelling or edema; medication-induced neural toxicity; particulate matter embolism and blood vessel occlusion with resultant organ, and/or nervous system infarction; and/or aseptic necrosis of one or more joints. Finally, the patient was informed that Medicine is not an exact science; therefore, there is also the possibility of unforeseen or unpredictable risks and/or possible complications that may result in a catastrophic outcome. The patient indicated having understood very  clearly. We have given the patient no guarantees and we have made no promises. Enough time was given to the patient to ask questions, all of which were answered to the patient's satisfaction. Richard Cook has indicated that he wanted to continue with the procedure. Attestation: I, the ordering provider, attest that I have discussed with the patient the benefits, risks, side-effects, alternatives, likelihood of achieving goals, and potential problems during recovery for the procedure that I have provided informed consent. Date  Time: 03/29/2018  1:31 PM  Pre-Procedure Preparation:  Monitoring: As per clinic protocol. Respiration, ETCO2, SpO2, BP, heart rate and rhythm monitor placed and checked for adequate function Safety Precautions: Patient was assessed for positional comfort and pressure points before starting the procedure. Time-out: I initiated and conducted the "Time-out" before starting the procedure, as per protocol. The patient was asked to participate by confirming the accuracy of the "Time Out" information. Verification of the correct person, site, and procedure were performed and confirmed by me, the nursing staff, and the patient. "Time-out" conducted as per Joint Commission's Universal Protocol (UP.01.01.01). Time: 1419  Description of Procedure:          Position: Prone Target Area: For Epidural Steroid injection(s), the target area is the  interlaminar space, initially targeting the lower border of the superior vertebral body lamina. Approach: Interlaminar approach. Area Prepped: Entire Posterior Thoracolumbar Region Prepping solution: ChloraPrep (2% chlorhexidine gluconate and 70% isopropyl alcohol) Safety Precautions: Aspiration looking for blood return was conducted prior to all injections. At no point did we inject any substances, as a needle was being advanced. No attempts were made at seeking any paresthesias. Safe injection practices and needle disposal techniques used.  Medications properly checked for expiration dates. SDV (single dose vial) medications used. Description of the Procedure: Protocol guidelines were followed. The patient was placed in position over the fluoroscopy table. The target area was identified and the area prepped in the usual manner. Skin & deeper tissues infiltrated with local anesthetic. Appropriate amount of time allowed to pass for local anesthetics to take effect. The procedure needles were then advanced to the target area. The  inferior aspect of the superior lamina was contacted and the needle walked caudad, until the lamina was cleared. The epidural space was identified using "loss-of-resistance technique" with 0.9% PF-NSS (2-51mL), in a low friction 10cc LOR glass syringe. Proper needle placement was secured. Negative aspiration confirmed. Solution injected in intermittent fashion, asking for systemic symptoms every 0.5 cc of injectate. The needles were then removed and the area cleansed, making sure to leave some of the prepping solution behind to take advantage of its long term bactericidal properties. Vitals:   03/29/18 1414 03/29/18 1419 03/29/18 1423 03/29/18 1426  BP: 131/85 125/86 131/89 (!) 136/92  Pulse:      Resp: (!) 9 10 10 10   Temp:      TempSrc:      SpO2: 98% 100% 100% 98%  Weight:      Height:        Start Time: 1419 hrs. End Time: 1425 hrs. Materials:  Needle(s) Type: Epidural needle Gauge: 17G Length: 3.5-in Medication(s): Please see orders for medications and dosing details.  Imaging Guidance (Spinal):          Type of Imaging Technique: Fluoroscopy Guidance (Spinal) Indication(s): Assistance in needle guidance and placement for procedures requiring needle placement in or near specific anatomical locations not easily accessible without such assistance. Exposure Time: Please see nurses notes. Contrast: Before injecting any contrast, we confirmed that the patient did not have an allergy to iodine, shellfish,  or radiological contrast. Once satisfactory needle placement was completed at the desired level, radiological contrast was injected. Contrast injected under live fluoroscopy. No contrast complications. See chart for type and volume of contrast used. Fluoroscopic Guidance: I was personally present during the use of fluoroscopy. "Tunnel Vision Technique" used to obtain the best possible view of the target area. Parallax error corrected before commencing the procedure. "Direction-depth-direction" technique used to introduce the needle under continuous pulsed fluoroscopy. Once target was reached, antero-posterior, oblique, and lateral fluoroscopic projection used confirm needle placement in all planes. Images permanently stored in EMR.      Interpretation: I personally interpreted the imaging intraoperatively. Adequate needle placement confirmed in multiple planes. Appropriate spread of contrast into desired area was observed. No evidence of afferent or efferent intravascular uptake. No intrathecal or subarachnoid spread observed. Permanent images saved into the patient's record.  Antibiotic Prophylaxis:   Anti-infectives (From admission, onward)   None     Indication(s): None identified  Post-operative Assessment:  Post-procedure Vital Signs:  Pulse/HCG Rate: (!) 52(!) 52 Temp: 97.8 F (36.6 C) Resp: 10 BP: (!) 136/92 SpO2: 98 %  EBL: None  Complications: No immediate post-treatment complications observed by team, or reported by patient.  Note: The patient tolerated the entire procedure well. A repeat set of vitals were taken after the procedure and the patient was kept under observation following institutional policy, for this type of procedure. Post-procedural neurological assessment was performed, showing return to baseline, prior to discharge. The patient was provided with post-procedure discharge instructions, including a section on how to identify potential problems. Should any  problems arise concerning this procedure, the patient was given instructions to immediately contact us, at any time, without hesitation. In any case, we plan to contact the patient by telephone for a follow-up status report regarding this interventional procedure.  Comments:  No additional relevant information.  Plan of Care   Imaging Orders     DG C-Arm 1-60 Min-No Report  Procedure Orders     Thoracic Epidural Injection  Medications ordered for  procedure: Meds ordered this encounter  Medications  . lidocaine (XYLOCAINE) 2 % (with pres) injection 400 mg  . DISCONTD: midazolam (VERSED) 5 MG/5ML injection 1-2 mg    Make sure Flumazenil is available in the pyxis when using this medication. If oversedation occurs, administer 0.2 mg IV over 15 sec. If after 45 sec no response, administer 0.2 mg again over 1 min; may repeat at 1 min intervals; not to exceed 4 doses (1 mg)  . DISCONTD: fentaNYL (SUBLIMAZE) injection 25-50 mcg    Make sure Narcan is available in the pyxis when using this medication. In the event of respiratory depression (RR< 8/min): Titrate NARCAN (naloxone) in increments of 0.1 to 0.2 mg IV at 2-3 minute intervals, until desired degree of reversal.  . DISCONTD: lactated ringers infusion 1,000 mL  . DISCONTD: ropivacaine (PF) 2 mg/mL (0.2%) (NAROPIN) injection 9 mL  . dexamethasone (DECADRON) injection 10 mg  . sodium chloride flush (NS) 0.9 % injection 2 mL  . ropivacaine (PF) 2 mg/mL (0.2%) (NAROPIN) injection 2 mL   Medications administered: We administered lidocaine, dexamethasone, sodium chloride flush, and ropivacaine (PF) 2 mg/mL (0.2%).  See the medical record for exact dosing, route, and time of administration.  New Prescriptions   No medications on file   Disposition: Discharge home  Discharge Date & Time: 03/29/2018; 1432 hrs.   Physician-requested Follow-up: Return for post-procedure eval (2 wks), w/ Dr. Dossie Arbour.  Future Appointments  Date Time Provider  [place]  04/18/2018  1:45 PM Milinda Pointer, MD [facility] None  05/24/2018  8:15 AM [facility]  06/02/2018  7:50 AM [facility] DEVICE REMOTES [facility]   Primary Care Physician: Birdie Sons, MD Location: [facility] Note by: Gaspar Cola, MD Date: 03/29/2018; Time: 3:27 PM  Disclaimer:  Medicine is not an Chief Strategy Officer. The only guarantee in medicine is that nothing is guaranteed. It is important to note that the decision to proceed with this intervention was based on the information collected from the patient. The Data and conclusions were drawn from the patient's questionnaire, the interview, and the physical examination. Because the information was provided in large part by the patient, it cannot be guaranteed that it has not been purposely or unconsciously manipulated. Every effort has been made to obtain as much relevant data as possible for this evaluation. It is important to note that the conclusions that lead to this procedure are derived in large part from the available data. Always take into account that the treatment will also be dependent on availability of resources and existing treatment guidelines, considered by other Pain Management Practitioners as being common knowledge and practice, at the time of the intervention. For Medico-Legal purposes, it is also important to point out that variation in procedural techniques and pharmacological choices are the acceptable norm. The indications, contraindications, technique, and results of the above procedure should only be interpreted and judged by a Board-Certified Interventional Pain Specialist with extensive familiarity and expertise in the same exact procedure and technique.

## 2018-03-29 NOTE — Progress Notes (Signed)
Results were reviewed and found to be: mildly abnormal  No acute injury or pathology identified  Review would suggest interventional pain management techniques may be of benefit 

## 2018-03-29 NOTE — Patient Instructions (Signed)

## 2018-03-30 ENCOUNTER — Telehealth: Payer: Self-pay | Admitting: *Deleted

## 2018-03-30 LAB — CUP PACEART REMOTE DEVICE CHECK
Battery Voltage: 3 V
Date Time Interrogation Session: 20190801052304
HighPow Impedance: 89 Ohm
Implantable Lead Implant Date: 20150318
Implantable Lead Location: 753860
Implantable Lead Model: 181
Implantable Lead Serial Number: 327195
Lead Channel Impedance Value: 589 Ohm
Lead Channel Pacing Threshold Amplitude: 1 V
Lead Channel Sensing Intrinsic Amplitude: 9 mV
Lead Channel Setting Sensing Sensitivity: 0.3 mV
MDC IDC MSMT BATTERY REMAINING LONGEVITY: 69 mo
MDC IDC MSMT LEADCHNL RV IMPEDANCE VALUE: 589 Ohm
MDC IDC MSMT LEADCHNL RV PACING THRESHOLD PULSEWIDTH: 0.4 ms
MDC IDC MSMT LEADCHNL RV SENSING INTR AMPL: 9 mV
MDC IDC PG IMPLANT DT: 20150318
MDC IDC SET LEADCHNL RV PACING AMPLITUDE: 2 V
MDC IDC SET LEADCHNL RV PACING PULSEWIDTH: 0.4 ms
MDC IDC STAT BRADY RV PERCENT PACED: 0.02 %

## 2018-03-30 NOTE — Telephone Encounter (Signed)
No problems post procedure. 

## 2018-04-05 ENCOUNTER — Telehealth: Payer: Self-pay | Admitting: Pain Medicine

## 2018-04-05 NOTE — Telephone Encounter (Signed)
Patient was doing well after procedure until he had hard fall in kitchen on Saturday. Now having severe pain on right side Per vmail left Mon 04-04-18 11:31

## 2018-04-05 NOTE — Telephone Encounter (Signed)
Attempted to reach patient, voicemail left to return our call.

## 2018-04-06 NOTE — Telephone Encounter (Signed)
States pain in lower back after falling is getting better. Advised to see PCP if having new onset of pain.

## 2018-04-07 ENCOUNTER — Other Ambulatory Visit: Payer: Self-pay | Admitting: Family Medicine

## 2018-04-07 DIAGNOSIS — M5489 Other dorsalgia: Secondary | ICD-10-CM

## 2018-04-07 MED ORDER — FUROSEMIDE 40 MG PO TABS: 40.0000 mg | ORAL_TABLET | Freq: Every day | ORAL | 2 refills | Status: DC

## 2018-04-07 MED ORDER — HYDROCODONE-ACETAMINOPHEN 10-325 MG PO TABS: 1.0000 | ORAL_TABLET | Freq: Three times a day (TID) | ORAL | 0 refills | Status: DC | PRN

## 2018-04-07 NOTE — Telephone Encounter (Signed)
Pt requesting refill of Hydrocodone 10-325 and Furosemide 40 MG sent to [facility] on [address] in [place]  Pt states he will be out of medication this weekend.

## 2018-04-11 ENCOUNTER — Telehealth: Payer: Self-pay | Admitting: Cardiology

## 2018-04-11 NOTE — Telephone Encounter (Signed)
Patient stated that he believes he heard an alert tone from his device and then he jerked. He believes that he received ICD therapy from his device. He stated that he feels fine. He stated that at his last OV with Dr. [name], MD told him that he can working out again. Patient is concerned that he may be over doing it at the gym.

## 2018-04-11 NOTE — Telephone Encounter (Signed)
Pt had questions about his transmission.

## 2018-04-11 NOTE — Telephone Encounter (Signed)
Remote from 04/11/18 @ 0344 reviewed. Presenting rhythm: Vs 64bpm. (48) "VT-NS" episodes since 03/17/18 - max dur. 13sec, last 04/05/18 - ST per EGMs. (47) SVT episodes. No treated episodes recorded. Stable thoracic impedance. Stable lead measurements.   Patient called back regarding episode from last night. Patient states that he heard a ringing in his ears and was jolted from his sleep.   Informed patient that remote was reviewed and he did not receive any therapies from his device. Patient verbalized understanding.

## 2018-04-14 ENCOUNTER — Telehealth: Payer: Self-pay | Admitting: Internal Medicine

## 2018-04-14 NOTE — Telephone Encounter (Signed)
° °  Patient calling concerns of elevated HR.    Pt c/o Shortness Of Breath: STAT if SOB developed within the last 24 hours or pt is noticeably SOB on the phone  1. Are you currently SOB (can you hear that pt is SOB on the phone)? NO  2. How long have you been experiencing SOB? 4 days  3. Are you SOB when sitting or when up moving around? Moving around  4. Are you currently experiencing any other symptoms? lightheaded    1) What is your heart rate? 94  2) Do you have a log of your heart rate readings (document readings)? 94-->104  3) Do you have any other symptoms? Dizziness, nausea   BP 104/87

## 2018-04-14 NOTE — Telephone Encounter (Signed)
Spoke with pt who states he believes he "just over did it at the gym while lifting weights." He states he took a nap today and is feeling much better. I advised him per Dr [name], if he is feeling SOB, he can take 80mg  Lasix, once, to see if this improves his breathing. Pt states he takes 40mg  qod currently.   Pt has verbalized understanding and had no additional questions.
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
## 2018-04-18 ENCOUNTER — Ambulatory Visit: Payer: Medicaid Other | Admitting: Pain Medicine

## 2018-05-02 ENCOUNTER — Telehealth: Payer: Self-pay | Admitting: Family Medicine

## 2018-05-02 ENCOUNTER — Encounter: Payer: Self-pay | Admitting: Family Medicine

## 2018-05-02 ENCOUNTER — Ambulatory Visit: Payer: Self-pay | Admitting: Family Medicine

## 2018-05-02 VITALS — BP 110/78 | HR 83 | Temp 99.0°F | Resp 18 | Wt 187.0 lb

## 2018-05-02 DIAGNOSIS — N185 Chronic kidney disease, stage 5: Secondary | ICD-10-CM

## 2018-05-02 DIAGNOSIS — R110 Nausea: Secondary | ICD-10-CM

## 2018-05-02 MED ORDER — CEPHALEXIN 500 MG PO CAPS: 500.0000 mg | ORAL_CAPSULE | Freq: Four times a day (QID) | ORAL | 0 refills | Status: DC

## 2018-05-02 NOTE — Telephone Encounter (Signed)
FYI:  Pt called saying he was in this morning and seen Dr. Caryn Section for stomach upset.  He forgot to tell Dr. Caryn Section that his mother had kronhs Dx,  He does not need anyone to call back. He just wanted Dr. Caryn Section to know this.  Thank s [name]
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
## 2018-05-03 ENCOUNTER — Encounter: Payer: Self-pay | Admitting: Family Medicine

## 2018-05-03 LAB — COMPREHENSIVE METABOLIC PANEL
ALT: 32 IU/L (ref 0–44)
AST: 22 IU/L (ref 0–40)
Albumin/Globulin Ratio: 1.6 (ref 1.2–2.2)
Albumin: 4.2 g/dL (ref 3.6–4.8)
Alkaline Phosphatase: 101 IU/L (ref 39–117)
BUN / CREAT RATIO: 10 (ref 10–24)
BUN: 36 mg/dL — AB (ref 8–27)
Bilirubin Total: 0.8 mg/dL (ref 0.0–1.2)
CO2: 22 mmol/L (ref 20–29)
CREATININE: 3.52 mg/dL — AB (ref 0.76–1.27)
Calcium: 9.7 mg/dL (ref 8.6–10.2)
Chloride: 100 mmol/L (ref 96–106)
GFR calc non Af Amer: 18 mL/min/{1.73_m2} — ABNORMAL LOW (ref >59)
GFR, EST AFRICAN AMERICAN: 20 mL/min/{1.73_m2} — AB (ref >59)
Globulin, Total: 2.6 g/dL (ref 1.5–4.5)
Glucose: 104 mg/dL — ABNORMAL HIGH (ref 65–99)
Potassium: 4.2 mmol/L (ref 3.5–5.2)
Sodium: 142 mmol/L (ref 134–144)
TOTAL PROTEIN: 6.8 g/dL (ref 6.0–8.5)

## 2018-05-03 LAB — CBC
HEMATOCRIT: 44.1 % (ref 37.5–51.0)
Hemoglobin: 14.8 g/dL (ref 13.0–17.7)
MCH: 31.2 pg (ref 26.6–33.0)
MCHC: 33.6 g/dL (ref 31.5–35.7)
MCV: 93 fL (ref 79–97)
PLATELETS: 244 10*3/uL (ref 150–450)
RBC: 4.74 x10E6/uL (ref 4.14–5.80)
RDW: 15.3 % (ref 12.3–15.4)
WBC: 10.5 10*3/uL (ref 3.4–10.8)

## 2018-05-10 ENCOUNTER — Other Ambulatory Visit: Payer: Self-pay | Admitting: Family Medicine

## 2018-05-10 DIAGNOSIS — M5489 Other dorsalgia: Secondary | ICD-10-CM

## 2018-05-10 MED ORDER — HYDROCODONE-ACETAMINOPHEN 10-325 MG PO TABS: 1.0000 | ORAL_TABLET | Freq: Three times a day (TID) | ORAL | 0 refills | Status: DC | PRN

## 2018-05-10 NOTE — Telephone Encounter (Signed)
Pt needs refill on   Hydrocodone 10 - 325  [facility]  Pt's CB#  [phone]  Thanks [name]

## 2018-05-11 ENCOUNTER — Telehealth: Payer: Self-pay | Admitting: Family Medicine

## 2018-05-11 DIAGNOSIS — S39012A Strain of muscle, fascia and tendon of lower back, initial encounter: Secondary | ICD-10-CM

## 2018-05-11 MED ORDER — PREDNISONE 10 MG PO TABS: ORAL_TABLET | ORAL | 0 refills | Status: DC

## 2018-05-11 NOTE — Telephone Encounter (Signed)
Gout medication: Prednisone  Thanks, [facility]

## 2018-05-11 NOTE — Telephone Encounter (Signed)
Pt requesting a refill on his gout flair up medication.  Please fill at:  [id number] [place], [place] [phone] (Phone) [phone] (Fax)   Thanks, [name]

## 2018-05-17 IMAGING — US US RENAL
1 series · 14 of 25 positions shown · non-contrast
Comparison: CT 02/28/2014

CLINICAL DATA: Acute renal failure.

EXAM:
RENAL / URINARY TRACT ULTRASOUND COMPLETE

[Series 1: us renal · 0.23mm/px · 14 of 34 slices shown]
[im 1/34]
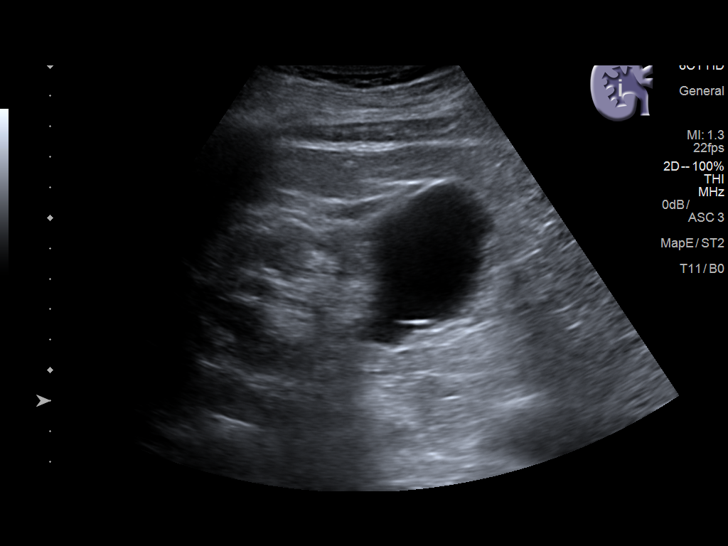
[im 3/34]
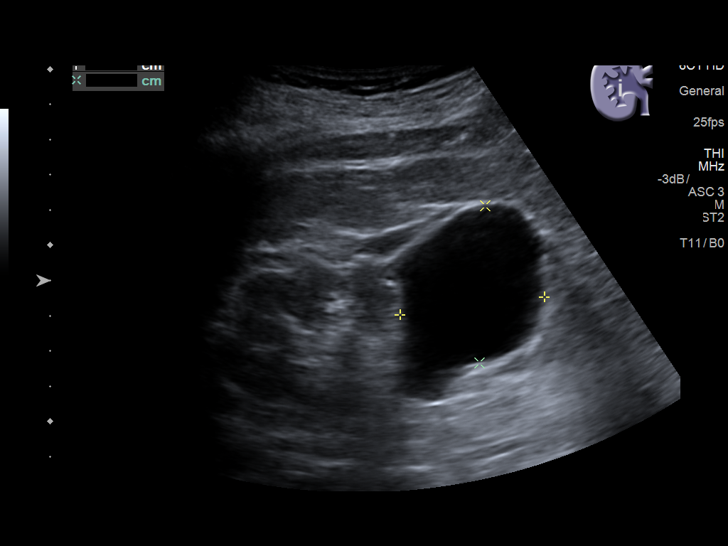
[im 6/34]
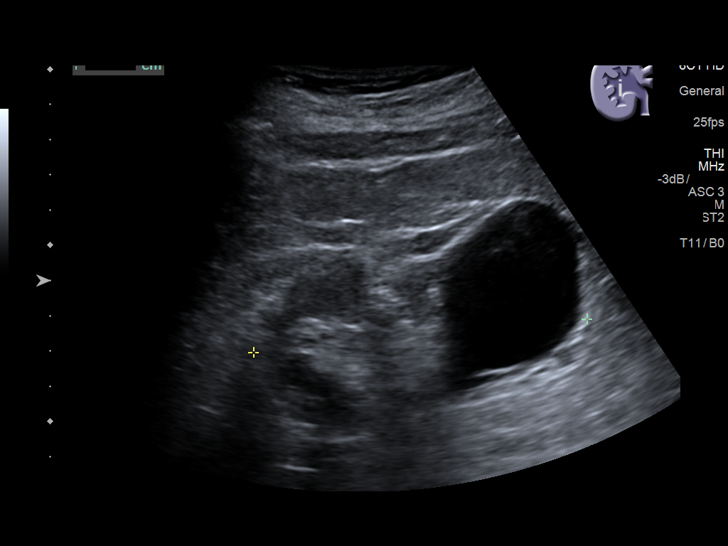
[im 9/34]
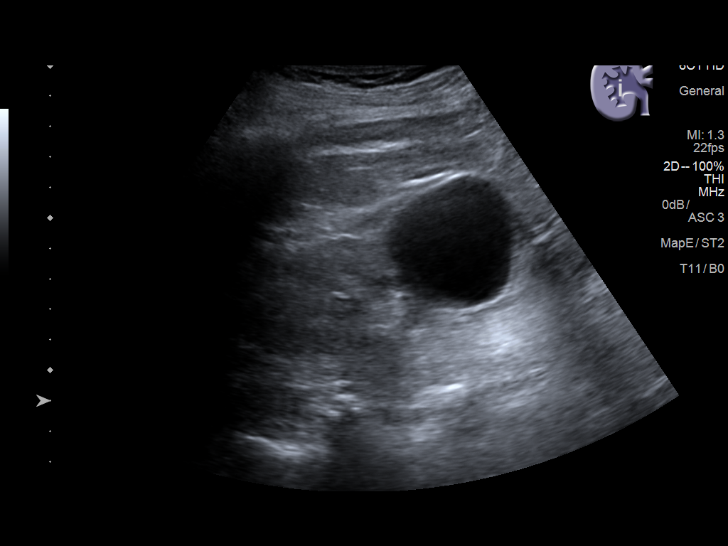
[im 12/34]
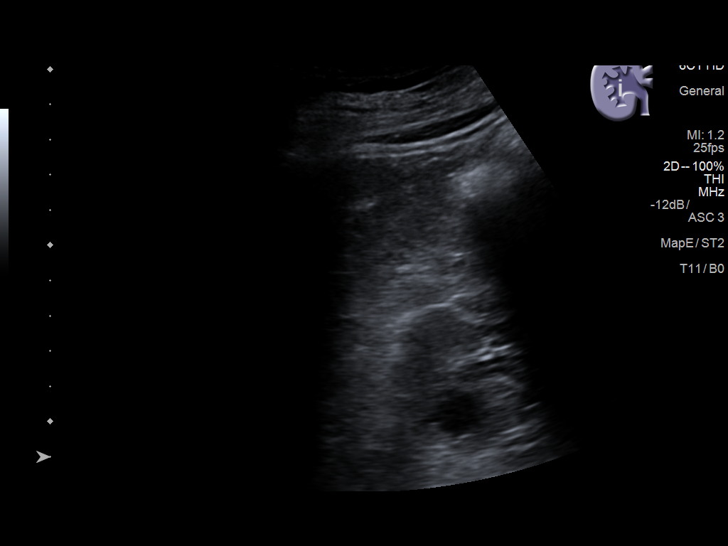
[im 13/34]
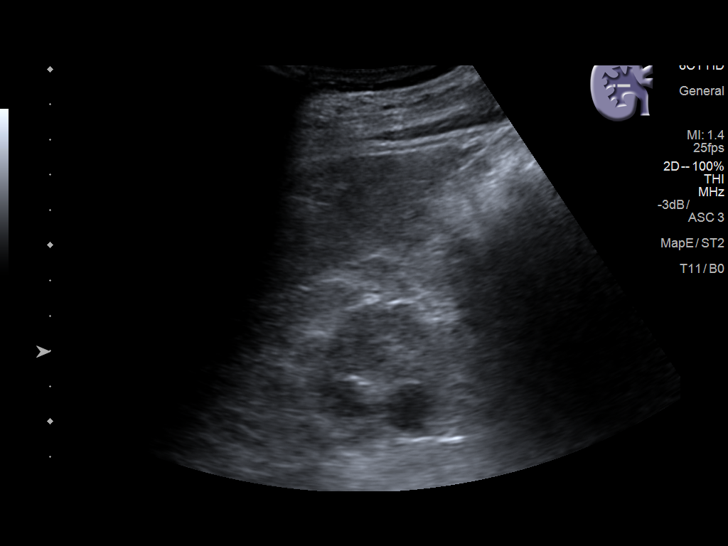
[im 16/34]
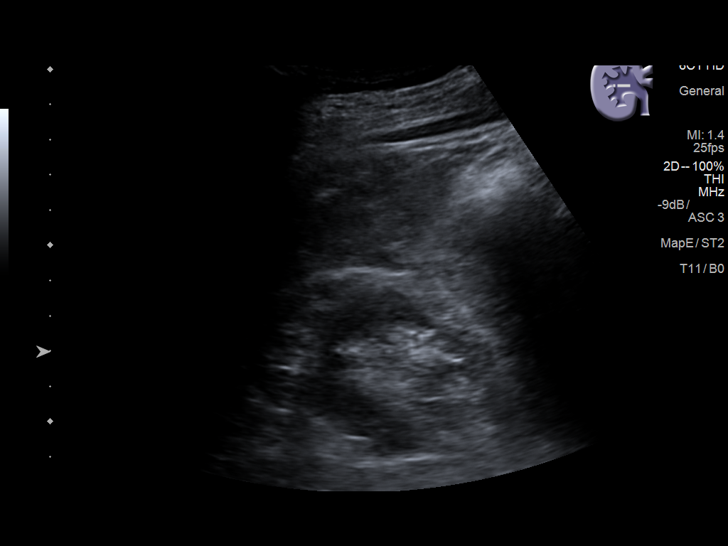
[im 18/34]
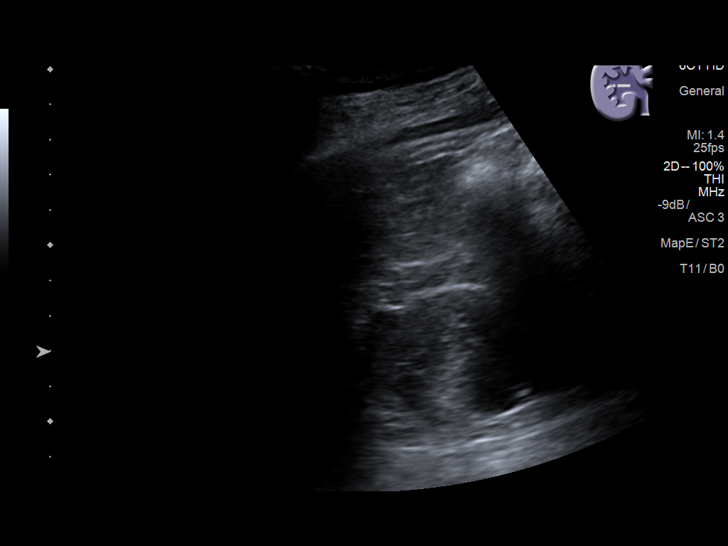
[im 21/34]
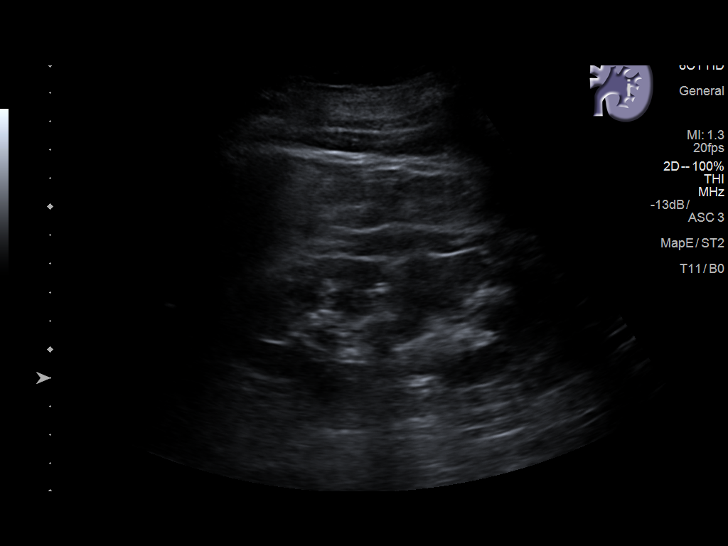
[im 23/34]
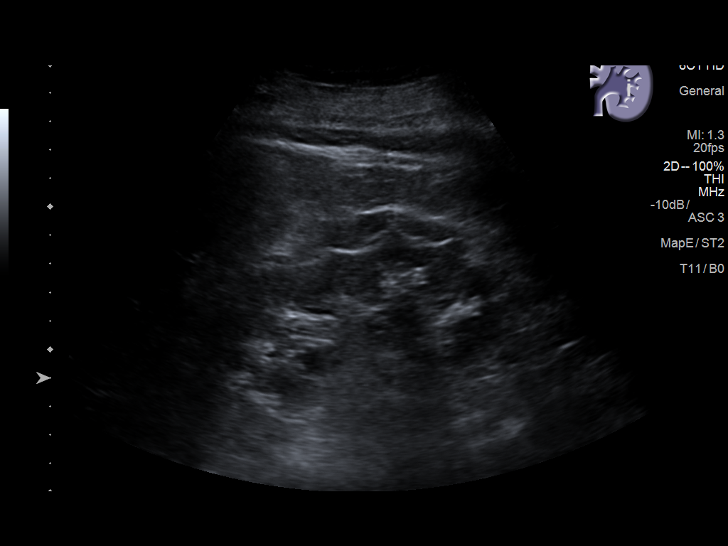
[im 25/34]
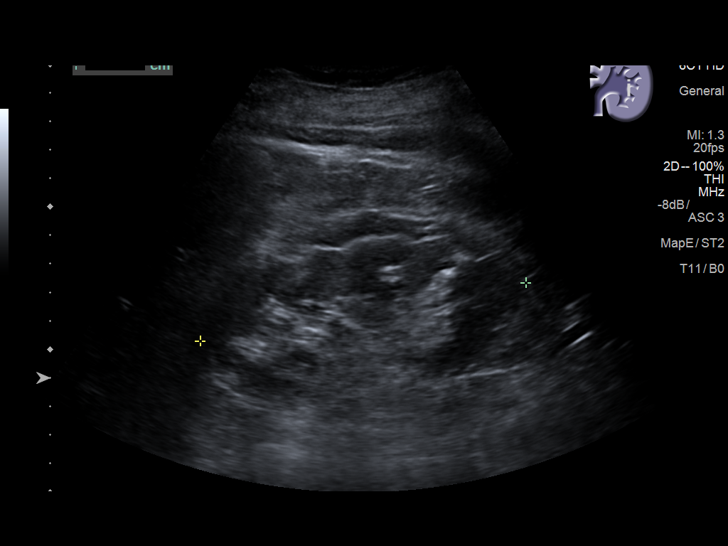
[im 28/34]
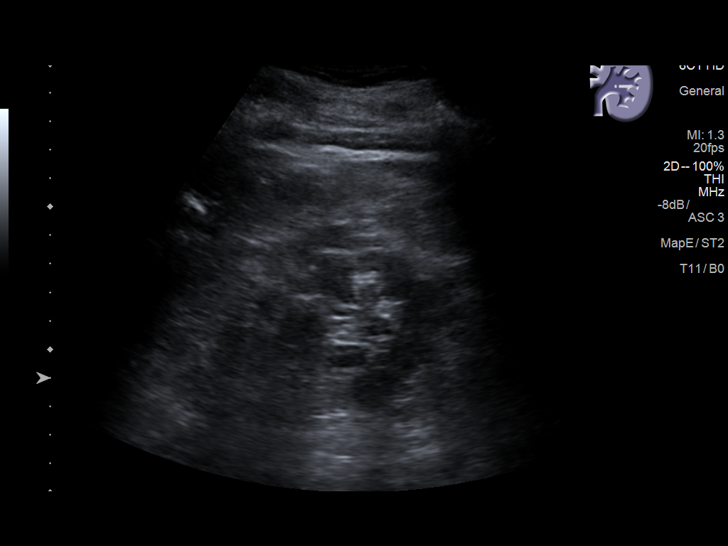
[im 31/34]
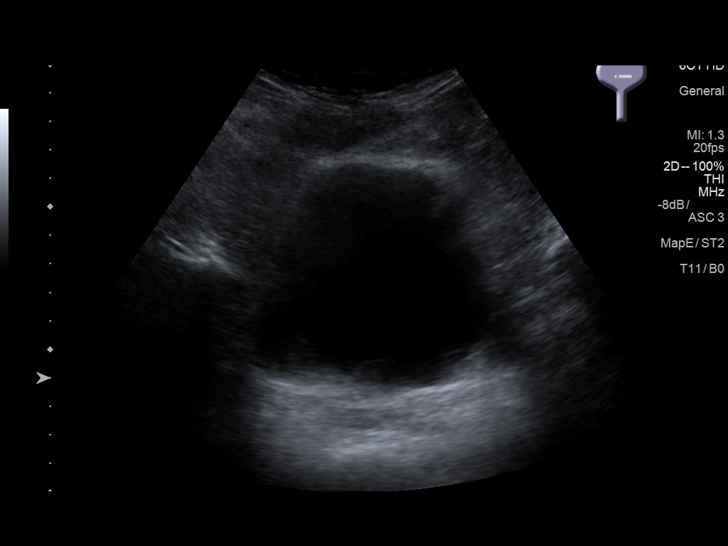
[im 34/34]
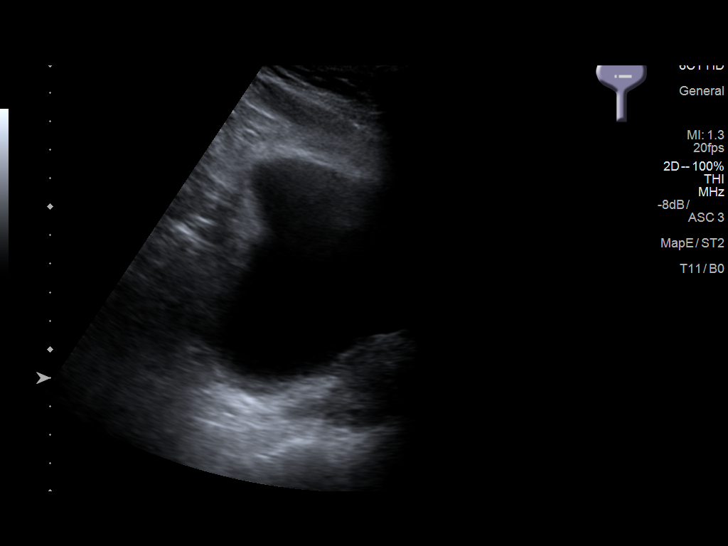

[14 of 25 positions shown; findings below may reference images not displayed]

FINDINGS: Right Kidney:

Length: 9.5 cm. Renal contours are lobular. Cyst arising from the
lower pole measures 4.1 x 4.5 x 5.8 cm. Smaller cyst in the upper
kidney measures 1.5 cm. There is thinning of the renal parenchyma.
Small stones on prior CT are not seen sonographically.

Left Kidney:

Length: 11.6 cm. Renal contours are lobular and there is thinning of
the renal parenchymal. Renal calcifications on prior CT are not seen
sonographically.

Bladder:

Appears normal for degree of bladder distention.
IMPRESSION: 1. No obstructive uropathy.
2. Thinning of the bilateral renal parenchyma with lobular contours,
chronic.
3. Right renal cysts.

## 2018-05-24 ENCOUNTER — Other Ambulatory Visit: Payer: Self-pay

## 2018-05-24 DIAGNOSIS — R748 Abnormal levels of other serum enzymes: Secondary | ICD-10-CM

## 2018-05-25 LAB — HEPATIC FUNCTION PANEL
ALT: 23 IU/L (ref 0–44)
AST: 24 IU/L (ref 0–40)
Albumin: 4 g/dL (ref 3.6–4.8)
Alkaline Phosphatase: 92 IU/L (ref 39–117)
Bilirubin Total: 0.7 mg/dL (ref 0.0–1.2)
Bilirubin, Direct: 0.15 mg/dL (ref 0.00–0.40)
TOTAL PROTEIN: 6.5 g/dL (ref 6.0–8.5)

## 2018-06-02 ENCOUNTER — Ambulatory Visit (INDEPENDENT_AMBULATORY_CARE_PROVIDER_SITE_OTHER): Payer: Self-pay | Admitting: *Deleted

## 2018-06-02 DIAGNOSIS — I472 Ventricular tachycardia, unspecified: Secondary | ICD-10-CM

## 2018-06-02 NOTE — Progress Notes (Signed)
Remote ICD transmission.   

## 2018-06-10 ENCOUNTER — Encounter: Payer: Self-pay | Admitting: Cardiology

## 2018-06-13 ENCOUNTER — Other Ambulatory Visit: Payer: Self-pay | Admitting: Family Medicine

## 2018-06-13 DIAGNOSIS — M5489 Other dorsalgia: Secondary | ICD-10-CM

## 2018-06-13 MED ORDER — HYDROCODONE-ACETAMINOPHEN 10-325 MG PO TABS: 1.0000 | ORAL_TABLET | Freq: Three times a day (TID) | ORAL | 0 refills | Status: DC | PRN

## 2018-06-13 NOTE — Telephone Encounter (Signed)
Pt contacted office for refill request on the following medications:  1. HYDROcodone-acetaminophen (NORCO) 10-325 MG tablet   2. predniSONE (DELTASONE) 10 MG tablet  [id number] [place] [place] - [address] [phone] (Phone) [phone] (Fax)  Please advise. Thanks TNP

## 2018-06-14 ENCOUNTER — Other Ambulatory Visit: Payer: Self-pay | Admitting: Family Medicine

## 2018-06-14 DIAGNOSIS — S39012A Strain of muscle, fascia and tendon of lower back, initial encounter: Secondary | ICD-10-CM

## 2018-06-17 IMAGING — DX DG CHEST 1V PORT
1 series · 1 of 1 positions shown · non-contrast
Comparison: 10/13/2017

CLINICAL DATA: V tach. Hx of defibrillator, chronic combined
systolic and diastolic CHF, hypertension, ischemic cardiomyopathy,
MI(3959), v tach ablation(10/07/16), cardiac catheterization(5388),
CABG(x3, 5388). Nonsmoker.

EXAM:
PORTABLE CHEST 1 VIEW

[chest ap]
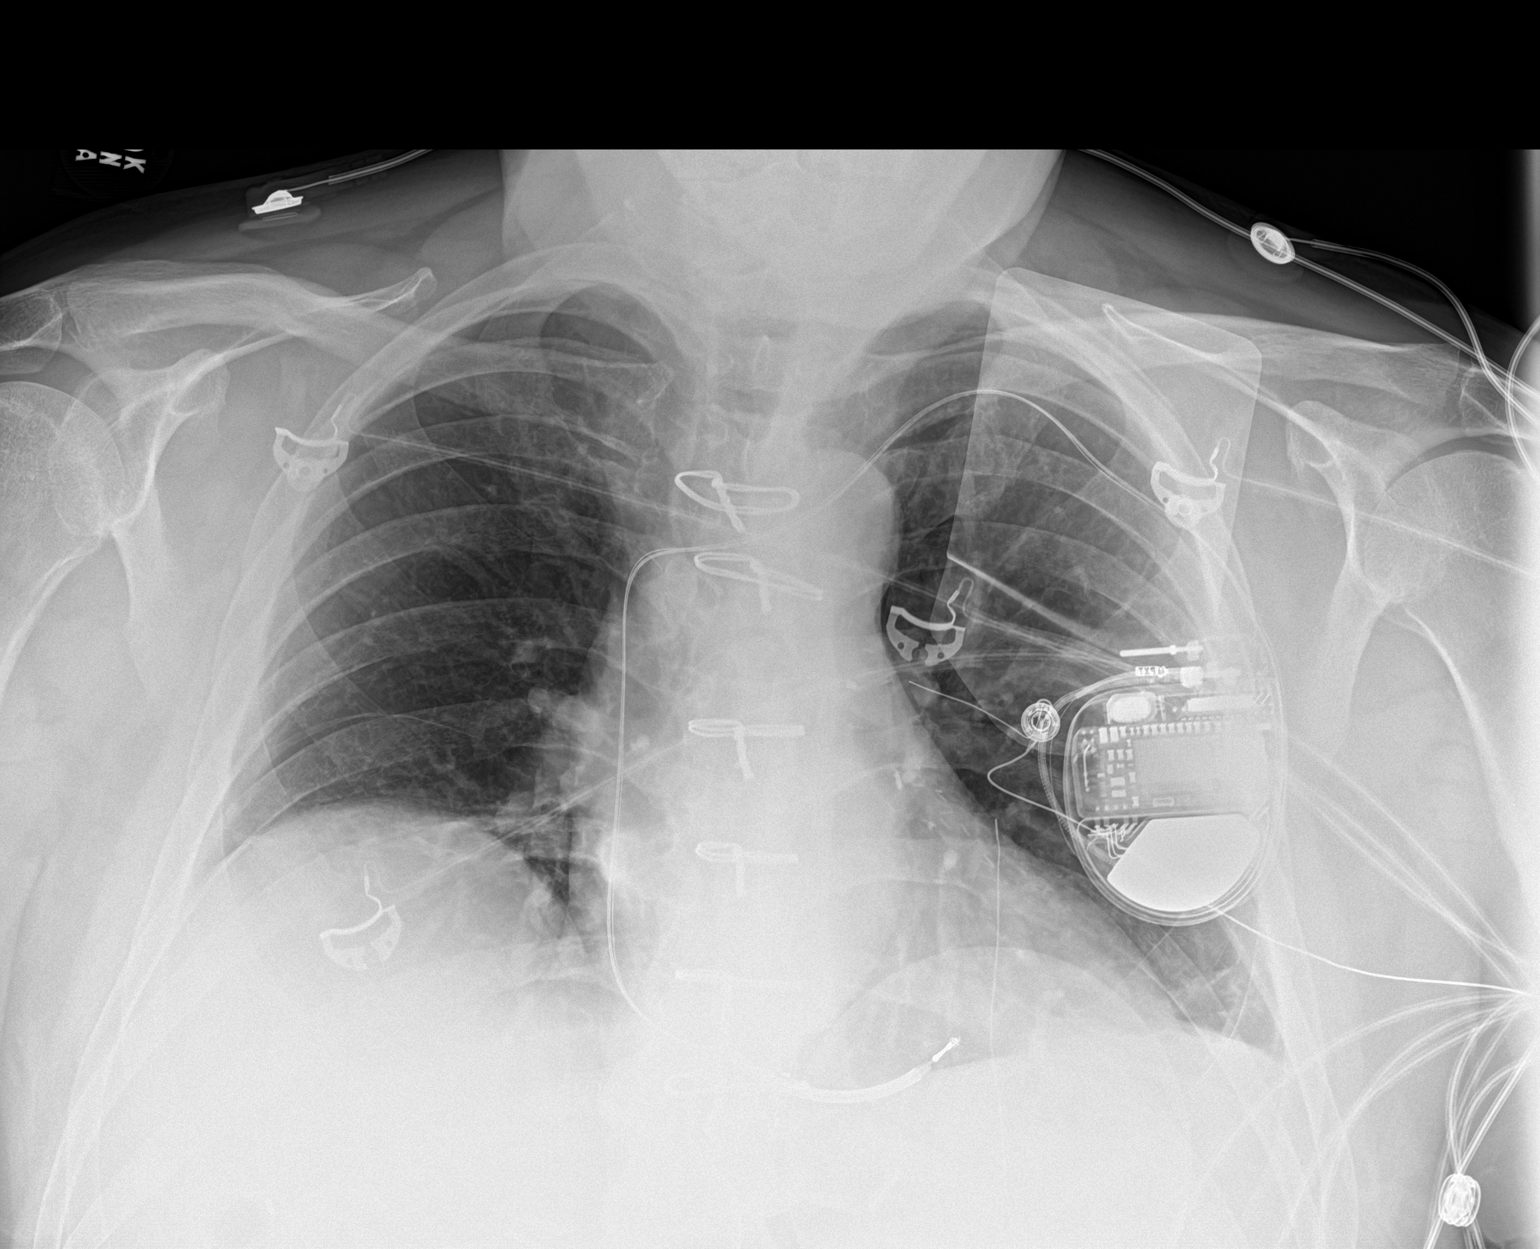

[1 of 1 positions shown; findings below may reference images not displayed]

FINDINGS: LEFT-sided pacemaker overlies normal cardiac silhouette. Midline
sternotomy. Fibrillation heads upper LEFT chest. Low lung volumes.
Mild LEFT basilar atelectasis. No effusion, infiltrate pneumothorax.
IMPRESSION: Low lung volumes.  No pulmonary edema.  No change from prior.
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
## 2018-06-21 ENCOUNTER — Encounter: Payer: Self-pay | Admitting: Physician Assistant

## 2018-07-06 ENCOUNTER — Other Ambulatory Visit: Payer: Self-pay | Admitting: Family Medicine

## 2018-07-06 DIAGNOSIS — G4700 Insomnia, unspecified: Secondary | ICD-10-CM

## 2018-07-06 MED ORDER — FUROSEMIDE 40 MG PO TABS: 40.0000 mg | ORAL_TABLET | Freq: Every day | ORAL | 12 refills | Status: DC | PRN

## 2018-07-06 NOTE — Telephone Encounter (Signed)
Patient needs refill on [facility]uro[facility] [address]

## 2018-07-11 ENCOUNTER — Ambulatory Visit (INDEPENDENT_AMBULATORY_CARE_PROVIDER_SITE_OTHER): Payer: Self-pay | Admitting: Family Medicine

## 2018-07-11 ENCOUNTER — Encounter: Payer: Self-pay | Admitting: Family Medicine

## 2018-07-11 VITALS — BP 106/64 | HR 84 | Temp 97.9°F | Resp 16 | Wt 188.0 lb

## 2018-07-11 DIAGNOSIS — F411 Generalized anxiety disorder: Secondary | ICD-10-CM

## 2018-07-11 DIAGNOSIS — N529 Male erectile dysfunction, unspecified: Secondary | ICD-10-CM

## 2018-07-11 DIAGNOSIS — G4700 Insomnia, unspecified: Secondary | ICD-10-CM

## 2018-07-11 MED ORDER — CLONAZEPAM 1 MG PO TABS: ORAL_TABLET | ORAL | 5 refills | Status: DC

## 2018-07-11 MED ORDER — TADALAFIL 10 MG PO TABS: 10.0000 mg | ORAL_TABLET | Freq: Every day | ORAL | 2 refills | Status: DC | PRN

## 2018-07-11 NOTE — Progress Notes (Signed)
Patient: Richard Cook Male    DOB: [birth year]   62 y.o.   MRN: [id number] Visit Date: 07/11/2018  Today's Provider: Lelon Huh, MD   Chief Complaint  Patient presents with  . Anxiety   Subjective:    Anxiety  Presents for follow-up (Worsening in the last month.  Pt is needing to take an extra 1/2 - 1 Clonazepam 1mg  a day.  ) visit. Symptoms include excessive worry, insomnia and nervous/anxious behavior. Patient reports no chest pain, decreased concentration, depressed mood, dizziness, muscle tension, nausea, palpitations, panic, restlessness, shortness of breath or suicidal ideas. The quality of sleep is poor.    States has been taking one clonazepam in the afternoon and 1 1/2 at bedtime which helps him get sleep. He does feel groggy in the morning, but nothing else has helped with insomnia. He is running out early and requests change in prescription refill for 1 tablet BID to 1 in the day and 1 1/2 hs.   He also requests medication for ED. States he is unable to maintain adequate erection for intercourse.      Allergies  Allergen Reactions  . Codeine Other (See Comments)    Tolerates Hydrocodone (??)  . Colchicine Nausea And Vomiting     Current Outpatient Medications:  .  allopurinol (ZYLOPRIM) 100 MG tablet, Take 1 tablet (100 mg total) by mouth daily., Disp: 30 tablet, Rfl: 5 .  amiodarone (PACERONE) 200 MG tablet, Take 2 tablets (400 mg total) by mouth daily. You may take this in the morning, Disp: 60 tablet, Rfl: 6 .  aspirin EC 81 MG tablet, Take 81 mg by mouth daily., Disp: , Rfl:  .  BEE POLLEN PO, Take 1 capsule by mouth daily. , Disp: , Rfl:  .  clonazePAM (KLONOPIN) 1 MG tablet, Take 1 tablet (1 mg total) by mouth 2 (two) times daily., Disp: 60 tablet, Rfl: 3 .  furosemide (LASIX) 40 MG tablet, Take 1 tablet (40 mg total) by mouth daily as needed for fluid., Disp: 30 tablet, Rfl: 12 .  HYDROcodone-acetaminophen (NORCO) 10-325 MG tablet, Take 1  tablet by mouth every 8 (eight) hours as needed., Disp: 60 tablet, Rfl: 0 .  Omega-3 Fatty Acids (FISH OIL PO), Take 1 capsule by mouth daily., Disp: , Rfl:  .  predniSONE (DELTASONE) 10 MG tablet, PLEASE SEE ATTACHED FOR DETAILED DIRECTIONS, Disp: 21 tablet, Rfl: 0 .  tiZANidine (ZANAFLEX) 4 MG capsule, Take 1 capsule (4 mg total) by mouth 3 (three) times daily as needed for muscle spasms. (Patient not taking: Reported on 05/02/2018), Disp: 60 capsule, Rfl: 0  Review of Systems  Constitutional: Negative.   Respiratory: Negative.  Negative for shortness of breath.   Cardiovascular: Negative.  Negative for chest pain and palpitations.  Gastrointestinal: Negative.  Negative for nausea.  Endocrine: Negative.   Musculoskeletal: Negative for neck pain and neck stiffness.  Neurological: Negative for dizziness.  Psychiatric/Behavioral: Positive for sleep disturbance. Negative for agitation, decreased concentration, dysphoric mood, hallucinations, self-injury and suicidal ideas. The patient is nervous/anxious and has insomnia. The patient is not hyperactive.     Social History   Tobacco Use  . Smoking status: Never Smoker  . Smokeless tobacco: Never Used  Substance Use Topics  . Alcohol use: No    Comment: 10/07/2016 "quit in the early 1990s"   Objective:   BP 106/64 (BP Location: Right Arm, Patient Position: Sitting, Cuff Size: Normal)   Pulse 84   Temp  97.9 F (36.6 C) (Oral)   Resp 16   Wt 188 lb (85.3 kg)   BMI 26.98 kg/m     Physical Exam   General Appearance:    Alert, cooperative, no distress  Eyes:    PERRL, conjunctiva/corneas clear, EOM's intact       Lungs:     Clear to auscultation bilaterally, respirations unlabored  Heart:    Regular rate and rhythm  Neurologic:   Awake, alert, oriented x 3. No apparent focal neurological           defect.          Assessment & Plan:     1. Anxiety state change- clonazePAM (KLONOPIN) 1 MG tablet;  To Take one tablet every  afternoon and 1 1/2 at bedtime  Dispense: 75 tablet; Refill: 5  2. Insomnia, unspecified type Better with extra 1/2 tablet at bedtime.   3. Erectile dysfunction, unspecified erectile dysfunction type Counseled on potential adverse effects ED medications including hypotension and syncope. To take cautiously, not stand up quickly, and drink plenty of fluids when taking medication.        Lelon Huh, MD  [facility]

## 2018-07-13 ENCOUNTER — Telehealth: Payer: Self-pay | Admitting: Family Medicine

## 2018-07-13 MED ORDER — CEPHALEXIN 500 MG PO CAPS: 500.0000 mg | ORAL_CAPSULE | Freq: Four times a day (QID) | ORAL | 0 refills | Status: AC

## 2018-07-13 NOTE — Telephone Encounter (Signed)
Pt advised.   Thanks,   -Laura  

## 2018-07-13 NOTE — Telephone Encounter (Signed)
Pt woke up with cellulitis in leg yesterday.  Asking if something could be called in.  He was just in for a visit on Mon.  [id number] [address], [place] [phone] (Phone) [phone] (Fax)   Please advise.  Thanks, [employer]

## 2018-07-13 NOTE — Telephone Encounter (Signed)
Please advise   Thanks,    -Laura  

## 2018-07-13 NOTE — Telephone Encounter (Signed)
[facility] O.V if not improving in 1-2 days, or if any fevers, chills, or sweats.

## 2018-07-14 ENCOUNTER — Other Ambulatory Visit: Payer: Self-pay | Admitting: Family Medicine

## 2018-07-14 DIAGNOSIS — M5489 Other dorsalgia: Secondary | ICD-10-CM

## 2018-07-14 NOTE — Telephone Encounter (Signed)
Pt needing a refill on:  HYDROcodone-acetaminophen (NORCO) 10-325 MG tablet    Please let pt know when the Rx is ready for him to come by to pick the hard copy.  Thanks, [employer]

## 2018-07-14 NOTE — Telephone Encounter (Signed)
Does he need it printed? Or can it just be sent to his pharmacy electronically.

## 2018-07-14 NOTE — Telephone Encounter (Signed)
Tried calling patient. Left message to call back. 

## 2018-07-15 MED ORDER — HYDROCODONE-ACETAMINOPHEN 10-325 MG PO TABS: 1.0000 | ORAL_TABLET | Freq: Three times a day (TID) | ORAL | 0 refills | Status: DC | PRN

## 2018-07-15 NOTE — Telephone Encounter (Signed)
Pt wants the Rx sent to his pharmacy. Pt has been without the medication for 2 days. Please call him back to let him know this has been sent in.  Thanks, TGh

## 2018-07-25 ENCOUNTER — Other Ambulatory Visit: Payer: Self-pay | Admitting: Internal Medicine

## 2018-07-25 MED ORDER — AMIODARONE HCL 200 MG PO TABS: 400.0000 mg | ORAL_TABLET | Freq: Every day | ORAL | 7 refills | Status: DC

## 2018-07-25 NOTE — Telephone Encounter (Signed)
Pt's medication was sent to pt's pharmacy as requested. Confirmation received.  °

## 2018-07-29 ENCOUNTER — Telehealth: Payer: Self-pay | Admitting: Family Medicine

## 2018-07-29 DIAGNOSIS — S39012A Strain of muscle, fascia and tendon of lower back, initial encounter: Secondary | ICD-10-CM

## 2018-07-29 MED ORDER — PREDNISONE 10 MG PO TABS: ORAL_TABLET | ORAL | 0 refills | Status: AC

## 2018-07-29 NOTE — Telephone Encounter (Signed)
Please review. Thanks!  

## 2018-07-29 NOTE — Telephone Encounter (Signed)
Pt is needing a call back because his knot on elbow is growing.  He is concerned because he couldn't be seen until next Fri with Fisher. Only wanted to see Dr. Caryn Section.  Thanks, [employer]

## 2018-07-29 NOTE — Telephone Encounter (Signed)
Patient has called office back requesting prescription for gout flare? Patient states that Dr. Caryn Section has him on Allopurinol but it has not been helping . KW

## 2018-07-31 LAB — CUP PACEART REMOTE DEVICE CHECK
Battery Voltage: 2.99 V
Date Time Interrogation Session: 20191031062824
HIGH POWER IMPEDANCE MEASURED VALUE: 79 Ohm
Implantable Lead Implant Date: 20150318
Implantable Lead Serial Number: 327195
Lead Channel Pacing Threshold Amplitude: 1 V
Lead Channel Pacing Threshold Pulse Width: 0.4 ms
Lead Channel Sensing Intrinsic Amplitude: 8.5 mV
Lead Channel Sensing Intrinsic Amplitude: 8.5 mV
Lead Channel Setting Pacing Amplitude: 2.25 V
MDC IDC LEAD LOCATION: 753860
MDC IDC MSMT BATTERY REMAINING LONGEVITY: 72 mo
MDC IDC MSMT LEADCHNL RV IMPEDANCE VALUE: 608 Ohm
MDC IDC MSMT LEADCHNL RV IMPEDANCE VALUE: 608 Ohm
MDC IDC PG IMPLANT DT: 20150318
MDC IDC SET LEADCHNL RV PACING PULSEWIDTH: 0.4 ms
MDC IDC SET LEADCHNL RV SENSING SENSITIVITY: 0.3 mV
MDC IDC STAT BRADY RV PERCENT PACED: 0.01 %

## 2018-08-05 ENCOUNTER — Ambulatory Visit: Payer: Self-pay | Admitting: Family Medicine

## 2018-08-11 ENCOUNTER — Other Ambulatory Visit: Payer: Self-pay | Admitting: Family Medicine

## 2018-08-11 ENCOUNTER — Telehealth: Payer: Self-pay

## 2018-08-11 DIAGNOSIS — M5489 Other dorsalgia: Secondary | ICD-10-CM

## 2018-08-11 MED ORDER — HYDROCODONE-ACETAMINOPHEN 10-325 MG PO TABS: 1.0000 | ORAL_TABLET | Freq: Three times a day (TID) | ORAL | 0 refills | Status: DC | PRN

## 2018-08-11 NOTE — Telephone Encounter (Signed)
Patient reports that he had a diarrhea that started last Thursday last week and took some Pepto Bismol and reports that his stool are black. Patient reports no more diarrhea, abdominal pain, no sweats, no blood clots or any sign of blood just the black stool. Patient was advised that Dr.Fisher said is from the [facility] and to give up to 2 days for it to clear up. If not and patient still concern gives [place] a call back and schedule an appointment.

## 2018-08-11 NOTE — Telephone Encounter (Signed)
Pt needing a refill on:  HYDROcodone-acetaminophen ([place]) 10-325 MG tablet  [facility] fill at: [id number] [address], [place] [phone] (Phone) [phone] (Fax)   Thanks,  [place]

## 2018-08-24 ENCOUNTER — Encounter: Payer: Self-pay | Admitting: Internal Medicine

## 2018-09-01 ENCOUNTER — Other Ambulatory Visit: Payer: Self-pay | Admitting: Family Medicine

## 2018-09-01 ENCOUNTER — Ambulatory Visit (INDEPENDENT_AMBULATORY_CARE_PROVIDER_SITE_OTHER): Payer: Self-pay

## 2018-09-01 DIAGNOSIS — I255 Ischemic cardiomyopathy: Secondary | ICD-10-CM

## 2018-09-02 LAB — CUP PACEART REMOTE DEVICE CHECK
Battery Remaining Longevity: 68 mo
Date Time Interrogation Session: 20200130083524
HIGH POWER IMPEDANCE MEASURED VALUE: 80 Ohm
Implantable Lead Implant Date: 20150318
Implantable Lead Serial Number: 327195
Implantable Pulse Generator Implant Date: 20150318
Lead Channel Impedance Value: 532 Ohm
Lead Channel Pacing Threshold Amplitude: 0.875 V
Lead Channel Sensing Intrinsic Amplitude: 9.5 mV
Lead Channel Sensing Intrinsic Amplitude: 9.5 mV
MDC IDC LEAD LOCATION: 753860
MDC IDC MSMT BATTERY VOLTAGE: 2.99 V
MDC IDC MSMT LEADCHNL RV IMPEDANCE VALUE: 551 Ohm
MDC IDC MSMT LEADCHNL RV PACING THRESHOLD PULSEWIDTH: 0.4 ms
MDC IDC SET LEADCHNL RV PACING AMPLITUDE: 2 V
MDC IDC SET LEADCHNL RV PACING PULSEWIDTH: 0.4 ms
MDC IDC SET LEADCHNL RV SENSING SENSITIVITY: 0.3 mV
MDC IDC STAT BRADY RV PERCENT PACED: 0.01 %

## 2018-09-05 ENCOUNTER — Encounter: Payer: Self-pay | Admitting: Physician Assistant

## 2018-09-06 ENCOUNTER — Ambulatory Visit: Payer: Self-pay | Admitting: Family Medicine

## 2018-09-08 ENCOUNTER — Encounter (INDEPENDENT_AMBULATORY_CARE_PROVIDER_SITE_OTHER): Payer: Self-pay

## 2018-09-08 ENCOUNTER — Encounter: Payer: Self-pay | Admitting: Internal Medicine

## 2018-09-08 ENCOUNTER — Ambulatory Visit: Payer: PPO | Admitting: Internal Medicine

## 2018-09-08 VITALS — BP 114/80 | HR 73 | Ht 70.0 in | Wt 187.8 lb

## 2018-09-08 DIAGNOSIS — Z95810 Presence of automatic (implantable) cardiac defibrillator: Secondary | ICD-10-CM | POA: Diagnosis not present

## 2018-09-08 DIAGNOSIS — I472 Ventricular tachycardia, unspecified: Secondary | ICD-10-CM

## 2018-09-08 DIAGNOSIS — R7989 Other specified abnormal findings of blood chemistry: Secondary | ICD-10-CM

## 2018-09-08 DIAGNOSIS — I255 Ischemic cardiomyopathy: Secondary | ICD-10-CM

## 2018-09-08 MED ORDER — AMIODARONE HCL 200 MG PO TABS: 300.0000 mg | ORAL_TABLET | Freq: Every day | ORAL | 3 refills | Status: DC

## 2018-09-08 MED ORDER — MEXILETINE HCL 200 MG PO CAPS: 200.0000 mg | ORAL_CAPSULE | Freq: Three times a day (TID) | ORAL | 3 refills | Status: DC

## 2018-09-08 MED ORDER — AMIODARONE HCL 200 MG PO TABS: 400.0000 mg | ORAL_TABLET | Freq: Every day | ORAL | 3 refills | Status: DC
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
## 2018-09-08 NOTE — Progress Notes (Signed)
Remote ICD transmission.   

## 2018-09-08 NOTE — Patient Instructions (Addendum)
Medication Instructions:  Your physician has recommended you make the following change in your medication:   Decrease you amiodarone to 300mg , 1 1/2 tablets, once daily.   Labwork: You will have labs drawn today: CMET, TSH  Testing/Procedures: None ordered.  Follow-Up:  Your physician recommends that you schedule a follow-up appointment in:   6 months with Dr [name]  Any Other Special Instructions Will Be Listed Below (If Applicable).     If you need a refill on your cardiac medications before your next appointment, please call your pharmacy.

## 2018-09-09 ENCOUNTER — Other Ambulatory Visit: Payer: Self-pay | Admitting: Family Medicine

## 2018-09-09 DIAGNOSIS — M5489 Other dorsalgia: Secondary | ICD-10-CM

## 2018-09-09 LAB — COMPREHENSIVE METABOLIC PANEL
A/G RATIO: 2 (ref 1.2–2.2)
ALT: 25 IU/L (ref 0–44)
AST: 24 IU/L (ref 0–40)
Albumin: 4.7 g/dL (ref 3.8–4.8)
Alkaline Phosphatase: 132 IU/L — ABNORMAL HIGH (ref 39–117)
BUN/Creatinine Ratio: 9 — ABNORMAL LOW (ref 10–24)
BUN: 29 mg/dL — AB (ref 8–27)
Bilirubin Total: 0.5 mg/dL (ref 0.0–1.2)
CALCIUM: 9.8 mg/dL (ref 8.6–10.2)
CO2: 17 mmol/L — ABNORMAL LOW (ref 20–29)
CREATININE: 3.12 mg/dL — AB (ref 0.76–1.27)
Chloride: 100 mmol/L (ref 96–106)
GFR, EST AFRICAN AMERICAN: 24 mL/min/{1.73_m2} — AB (ref >59)
GFR, EST NON AFRICAN AMERICAN: 20 mL/min/{1.73_m2} — AB (ref >59)
Globulin, Total: 2.3 g/dL (ref 1.5–4.5)
Glucose: 68 mg/dL (ref 65–99)
POTASSIUM: 4.4 mmol/L (ref 3.5–5.2)
Sodium: 144 mmol/L (ref 134–144)
TOTAL PROTEIN: 7 g/dL (ref 6.0–8.5)

## 2018-09-09 LAB — CUP PACEART INCLINIC DEVICE CHECK
Battery Voltage: 2.99 V
Brady Statistic RV Percent Paced: 0.21 %
Date Time Interrogation Session: 20200206201556
HighPow Impedance: 87 Ohm
Implantable Lead Implant Date: 20150318
Implantable Lead Model: 181
Implantable Lead Serial Number: 327195
Lead Channel Impedance Value: 646 Ohm
Lead Channel Impedance Value: 646 Ohm
Lead Channel Sensing Intrinsic Amplitude: 11.25 mV
Lead Channel Sensing Intrinsic Amplitude: 9 mV
Lead Channel Setting Pacing Amplitude: 2 V
Lead Channel Setting Pacing Pulse Width: 0.4 ms
MDC IDC LEAD LOCATION: 753860
MDC IDC MSMT BATTERY REMAINING LONGEVITY: 68 mo
MDC IDC MSMT LEADCHNL RV PACING THRESHOLD AMPLITUDE: 1 V
MDC IDC MSMT LEADCHNL RV PACING THRESHOLD PULSEWIDTH: 0.4 ms
MDC IDC PG IMPLANT DT: 20150318
MDC IDC SET LEADCHNL RV SENSING SENSITIVITY: 0.3 mV

## 2018-09-09 LAB — TSH: TSH: 4.94 u[IU]/mL — ABNORMAL HIGH (ref 0.450–4.500)

## 2018-09-09 MED ORDER — HYDROCODONE-ACETAMINOPHEN 10-325 MG PO TABS: 1.0000 | ORAL_TABLET | Freq: Three times a day (TID) | ORAL | 0 refills | Status: DC | PRN

## 2018-09-09 NOTE — Telephone Encounter (Signed)
Pt needs refil on his hydrocodone 10-325  [facility] [address]  [phone]  Thanks Richard Cook

## 2018-09-12 ENCOUNTER — Encounter: Payer: Self-pay | Admitting: Cardiology

## 2018-09-13 ENCOUNTER — Encounter: Payer: Self-pay | Admitting: Family Medicine

## 2018-09-13 ENCOUNTER — Ambulatory Visit (INDEPENDENT_AMBULATORY_CARE_PROVIDER_SITE_OTHER): Payer: PPO | Admitting: Family Medicine

## 2018-09-13 VITALS — BP 112/74 | HR 81 | Temp 98.4°F | Wt 188.0 lb

## 2018-09-13 DIAGNOSIS — R5383 Other fatigue: Secondary | ICD-10-CM | POA: Diagnosis not present

## 2018-09-13 DIAGNOSIS — M7022 Olecranon bursitis, left elbow: Secondary | ICD-10-CM

## 2018-09-13 DIAGNOSIS — M109 Gout, unspecified: Secondary | ICD-10-CM | POA: Diagnosis not present

## 2018-09-13 MED ORDER — PREDNISONE 10 MG PO TABS: ORAL_TABLET | ORAL | 0 refills | Status: DC

## 2018-09-13 MED ORDER — AMOXICILLIN 875 MG PO TABS: 875.0000 mg | ORAL_TABLET | Freq: Two times a day (BID) | ORAL | 0 refills | Status: DC

## 2018-09-13 NOTE — Progress Notes (Signed)
Patient: Richard Cook Male    DOB: 16-Jan-1956   63 y.o.   MRN: [id number] Visit Date: 09/13/2018  Today's Provider: Lelon Huh, MD   Chief Complaint  Patient presents with  . Joint Swelling    Left elbow  . Gout    Left Thumb   Subjective:     HPI   Gout: Patient here for evaluation of acute gouty arthritis. The patient reports onset of an acute gout attack involving the left thumb beginning 2 days, and being treated with Hydrocodone. Patient reports his chronic pain is worse, his joint stiffness is worse and his joint swelling is worse. Limitation on activities include unable to bend his thumb  Joint Swelling  Pt states he has a "knot" on his left elbow.  It has been there for about a month he was prescribed prednisone which helped some but has never completely gone away.   He is also concerned he has low testosterone.  Pt states since July he has noticed Increase hair loss, ED, reduced muscle mass, and trouble sleeping.    He also complains of persistent fatigue and difficulty maintaining muscle mass despite regular vigorous exercise.   Allergies  Allergen Reactions  . Codeine Other (See Comments)    Tolerates Hydrocodone (??)  . Colchicine Nausea And Vomiting     Current Outpatient Medications:  .  allopurinol (ZYLOPRIM) 100 MG tablet, TAKE 1 TABLET BY MOUTH EVERY DAY, Disp: 30 tablet, Rfl: 11 .  amiodarone (PACERONE) 200 MG tablet, Take 1.5 tablets (300 mg total) by mouth daily. You may take this in the morning, Disp: 125 tablet, Rfl: 3 .  aspirin EC 81 MG tablet, Take 81 mg by mouth daily., Disp: , Rfl:  .  BEE POLLEN PO, Take 1 capsule by mouth daily. , Disp: , Rfl:  .  clonazePAM (KLONOPIN) 1 MG tablet, Take one tablet every afternoon and 1 1/2 at bedtime, Disp: 75 tablet, Rfl: 5 .  furosemide (LASIX) 40 MG tablet, Take 1 tablet (40 mg total) by mouth daily as needed for fluid., Disp: 30 tablet, Rfl: 12 .  HYDROcodone-acetaminophen (NORCO) 10-325  MG tablet, Take 1 tablet by mouth every 8 (eight) hours as needed., Disp: 60 tablet, Rfl: 0 .  mexiletine (MEXITIL) 200 MG capsule, Take 1 capsule (200 mg total) by mouth 3 (three) times daily., Disp: 180 capsule, Rfl: 3 .  Omega-3 Fatty Acids (FISH OIL PO), Take 1 capsule by mouth daily., Disp: , Rfl:  .  tadalafil (CIALIS) 10 MG tablet, Take 1 tablet (10 mg total) by mouth daily as needed for erectile dysfunction., Disp: 10 tablet, Rfl: 2  Review of Systems  Constitutional: Positive for fatigue. Negative for activity change, appetite change, chills, diaphoresis, fever and unexpected weight change.  HENT: Negative for sore throat, trouble swallowing and voice change.   Respiratory: Negative.   Cardiovascular: Negative.   Gastrointestinal: Positive for abdominal pain. Negative for abdominal distention, anal bleeding, blood in stool, constipation, diarrhea, nausea, rectal pain and vomiting.  Endocrine: Negative.   Musculoskeletal: Positive for arthralgias and joint swelling. Negative for back pain, gait problem, myalgias, neck pain and neck stiffness.  Neurological: Negative for dizziness, light-headedness and headaches.  Psychiatric/Behavioral: Positive for sleep disturbance.    Social History   Tobacco Use  . Smoking status: Never Smoker  . Smokeless tobacco: Never Used  Substance Use Topics  . Alcohol use: No    Comment: 10/07/2016 "quit in the early 1990s"  Objective:   BP 112/74 (BP Location: Right Arm, Patient Position: Sitting, Cuff Size: Normal)   Pulse 81   Temp 98.4 F (36.9 C) (Oral)   Wt 188 lb (85.3 kg)   BMI 26.98 kg/m  Vitals:   09/13/18 1610  BP: 112/74  Pulse: 81  Temp: 98.4 F (36.9 C)  TempSrc: Oral  Weight: 188 lb (85.3 kg)     Physical Exam  General appearance: alert, well developed, well nourished, cooperative and in no distress Head: Normocephalic, without obvious abnormality, atraumatic Respiratory: Respirations even and unlabored, normal  respiratory rate Extremities: Red and tender left 1st MCP c/w gout. Small fluctuate mass, non tender over left olecranon. Slightly erythematous    Assessment & Plan    1. Fatigue, unspecified type  - Testosterone,Free and Total  2. Acute gout of left hand, unspecified cause  - Uric acid - predniSONE (DELTASONE) 10 MG tablet; 6 tablets for 1 day, then 5 for 1 day, then 4 for 1 day, then 3 for 1 day, then 2 for 1 day then 1 for 1 day.  Dispense: 21 tablet; Refill: 0  3. Olecranon bursitis of left elbow  - amoxicillin (AMOXIL) 875 MG tablet; Take 1 tablet (875 mg total) by mouth 2 (two) times daily.  Dispense: 20 tablet; Refill: 0     Lelon Huh, MD  [facility]

## 2018-09-13 NOTE — Patient Instructions (Signed)
.   Please review the attached list of medications and notify my office if there are any errors.   . Please bring all of your medications to every appointment so we can make sure that our medication list is the same as yours.   

## 2018-09-14 DIAGNOSIS — M109 Gout, unspecified: Secondary | ICD-10-CM | POA: Diagnosis not present

## 2018-09-14 DIAGNOSIS — R5383 Other fatigue: Secondary | ICD-10-CM | POA: Diagnosis not present

## 2018-09-15 LAB — TESTOSTERONE,FREE AND TOTAL
Testosterone, Free: 6.8 pg/mL (ref 6.6–18.1)
Testosterone: 990 ng/dL — ABNORMAL HIGH (ref 264–916)

## 2018-09-15 LAB — URIC ACID: Uric Acid: 8.5 mg/dL (ref 3.7–8.6)

## 2018-09-16 ENCOUNTER — Telehealth: Payer: Self-pay

## 2018-09-16 DIAGNOSIS — M1A09X0 Idiopathic chronic gout, multiple sites, without tophus (tophi): Secondary | ICD-10-CM

## 2018-09-16 MED ORDER — ALLOPURINOL 300 MG PO TABS: 300.0000 mg | ORAL_TABLET | Freq: Every day | ORAL | 1 refills | Status: DC

## 2018-09-16 NOTE — Telephone Encounter (Signed)
Patient advised and agrees with treatment plan. Patient wants to call back at a later date to schedule 3 month follow up appointment. Prescription sent into pharmacy.

## 2018-09-16 NOTE — Telephone Encounter (Signed)
-----   Message from Birdie Sons, MD sent at 09/15/2018 12:16 PM EST ----- Testosterone levels are on the high end. defiinately not Low.  Uric acid levels are high which is why he keeps having gout. Need to change allopurinol to 300mg  daily, #90, rf x 1. Need to follow up in 3 months and recheck uric acid levels at follow up.

## 2018-09-26 ENCOUNTER — Telehealth: Payer: Self-pay | Admitting: Family Medicine

## 2018-09-26 DIAGNOSIS — M109 Gout, unspecified: Secondary | ICD-10-CM

## 2018-09-26 MED ORDER — PREDNISONE 10 MG PO TABS: ORAL_TABLET | ORAL | 1 refills | Status: DC

## 2018-09-26 NOTE — Telephone Encounter (Signed)
Pt has had the gout return in his wrist in 3 days after finishing the prednisone.  Pt was supposed to have refills on the prednisone.  Please fill at:   [id number] - [place], [place] [phone] (Phone) [phone] (Fax)   Thanks, [facility]

## 2018-09-26 NOTE — Telephone Encounter (Signed)
Pt calling back to check on the status of refill/s. In severe pain.  Please advise.  Thanks, [employer]

## 2018-10-06 ENCOUNTER — Other Ambulatory Visit: Payer: Self-pay

## 2018-10-06 DIAGNOSIS — M5489 Other dorsalgia: Secondary | ICD-10-CM

## 2018-10-06 MED ORDER — HYDROCODONE-ACETAMINOPHEN 10-325 MG PO TABS: 1.0000 | ORAL_TABLET | Freq: Three times a day (TID) | ORAL | 0 refills | Status: DC | PRN

## 2018-10-06 NOTE — Telephone Encounter (Signed)
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 Patient request refill. Patient will be out of medication on Saturday 10/08/2018. Please advise.

## 2018-10-24 ENCOUNTER — Ambulatory Visit (INDEPENDENT_AMBULATORY_CARE_PROVIDER_SITE_OTHER): Payer: PPO | Admitting: Family Medicine

## 2018-10-24 ENCOUNTER — Encounter: Payer: Self-pay | Admitting: Family Medicine

## 2018-10-24 ENCOUNTER — Other Ambulatory Visit: Payer: Self-pay

## 2018-10-24 VITALS — BP 120/80 | HR 82 | Temp 98.5°F | Resp 16 | Wt 189.0 lb

## 2018-10-24 DIAGNOSIS — L0390 Cellulitis, unspecified: Secondary | ICD-10-CM | POA: Diagnosis not present

## 2018-10-24 DIAGNOSIS — N185 Chronic kidney disease, stage 5: Secondary | ICD-10-CM | POA: Diagnosis not present

## 2018-10-24 DIAGNOSIS — M1A9XX0 Chronic gout, unspecified, without tophus (tophi): Secondary | ICD-10-CM

## 2018-10-24 DIAGNOSIS — L989 Disorder of the skin and subcutaneous tissue, unspecified: Secondary | ICD-10-CM

## 2018-10-24 MED ORDER — CEPHALEXIN 500 MG PO CAPS: 500.0000 mg | ORAL_CAPSULE | Freq: Four times a day (QID) | ORAL | 0 refills | Status: DC

## 2018-10-24 NOTE — Progress Notes (Signed)
Patient: Richard Cook Male    DOB: 15-May-1956   63 y.o.   MRN: [id number] Visit Date: 10/24/2018  Today's Provider: Lelon Huh, MD   Chief Complaint  Patient presents with  . Mass   Subjective:     HPI Skin Lesion: Patient comes in today for and evaluation of a facial skin lesion. The lesion first appeared 6 months ago and recently has increased in size become sore. Patient has tried rubbing the lesion off with a disinfectant wipe, only to find that the lesion return the next day.   He also has CKD and frequent gout flares. Is no longer seeing nephrology and states that his cardiologist is following his kidney disease. Is taking allopurinol consistently and tolerating well.   Allergies  Allergen Reactions  . Codeine Other (See Comments)    Tolerates Hydrocodone (??)  . Colchicine Nausea And Vomiting     Current Outpatient Medications:  .  allopurinol (ZYLOPRIM) 300 MG tablet, Take 1 tablet (300 mg total) by mouth daily., Disp: 90 tablet, Rfl: 1 .  amiodarone (PACERONE) 200 MG tablet, Take 1.5 tablets (300 mg total) by mouth daily. You may take this in the morning, Disp: 125 tablet, Rfl: 3 .  aspirin EC 81 MG tablet, Take 81 mg by mouth daily., Disp: , Rfl:  .  BEE POLLEN PO, Take 1 capsule by mouth daily. , Disp: , Rfl:  .  clonazePAM (KLONOPIN) 1 MG tablet, Take one tablet every afternoon and 1 1/2 at bedtime, Disp: 75 tablet, Rfl: 5 .  furosemide (LASIX) 40 MG tablet, Take 1 tablet (40 mg total) by mouth daily as needed for fluid., Disp: 30 tablet, Rfl: 12 .  HYDROcodone-acetaminophen (NORCO) 10-325 MG tablet, Take 1 tablet by mouth every 8 (eight) hours as needed., Disp: 60 tablet, Rfl: 0 .  mexiletine (MEXITIL) 200 MG capsule, Take 1 capsule (200 mg total) by mouth 3 (three) times daily., Disp: 180 capsule, Rfl: 3 .  Omega-3 Fatty Acids (FISH OIL PO), Take 1 capsule by mouth daily., Disp: , Rfl:  .  tadalafil (CIALIS) 10 MG tablet, Take 1 tablet (10 mg  total) by mouth daily as needed for erectile dysfunction., Disp: 10 tablet, Rfl: 2  Review of Systems  Constitutional: Negative for appetite change, chills and fever.  Respiratory: Negative for chest tightness, shortness of breath and wheezing.   Cardiovascular: Negative for chest pain and palpitations.  Gastrointestinal: Negative for abdominal pain, nausea and vomiting.  Musculoskeletal: Positive for arthralgias (left wrist pain).  Skin:       Skin lesion on the left side of face  Psychiatric/Behavioral: Positive for confusion.    Social History   Tobacco Use  . Smoking status: Never Smoker  . Smokeless tobacco: Never Used  Substance Use Topics  . Alcohol use: No    Comment: 10/07/2016 "quit in the early 1990s"      Objective:   BP 120/80 (BP Location: Left Arm, Patient Position: Sitting, Cuff Size: Large)   Pulse 82   Temp 98.5 F (36.9 C) (Oral)   Resp 16   Wt 189 lb (85.7 kg)   SpO2 96% Comment: room air  BMI 27.12 kg/m  Vitals:   10/24/18 1346  BP: 120/80  Pulse: 82  Resp: 16  Temp: 98.5 F (36.9 C)  TempSrc: Oral  SpO2: 96%  Weight: 189 lb (85.7 kg)     Physical Exam  General appearance: alert, well developed, well nourished, cooperative and  in no distress Head: Normocephalic, without obvious abnormality, atraumatic Respiratory: Respirations even and unlabored, normal respiratory rate Skin: slightly erythematous about 3-4 irregular hyperkeratotic skin lesion left cheek.      Assessment & Plan    1. Skin lesion of cheek  - Ambulatory referral to Dermatology  2. Cellulitis, unspecified cellulitis site Cephalexin 500 QID   3. Chronic gout without tophus, unspecified cause, unspecified site Tolerating allopurinol - Uric acid  4. 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, stage 5 ([place])  - Renal function panel - VITAMIN D 25 Hydroxy (Vit-D Deficiency, Fractures)     Lelon Huh, MD  [facility]

## 2018-10-25 ENCOUNTER — Telehealth: Payer: Self-pay

## 2018-10-25 LAB — RENAL FUNCTION PANEL
Albumin: 4.1 g/dL (ref 3.8–4.8)
BUN/Creatinine Ratio: 14 (ref 10–24)
BUN: 48 mg/dL — ABNORMAL HIGH (ref 8–27)
CO2: 24 mmol/L (ref 20–29)
CREATININE: 3.38 mg/dL — AB (ref 0.76–1.27)
Calcium: 9.6 mg/dL (ref 8.6–10.2)
Chloride: 100 mmol/L (ref 96–106)
GFR calc Af Amer: 21 mL/min/{1.73_m2} — ABNORMAL LOW (ref >59)
GFR calc non Af Amer: 18 mL/min/{1.73_m2} — ABNORMAL LOW (ref >59)
Glucose: 94 mg/dL (ref 65–99)
Phosphorus: 2.7 mg/dL — ABNORMAL LOW (ref 2.8–4.1)
Potassium: 4.1 mmol/L (ref 3.5–5.2)
Sodium: 143 mmol/L (ref 134–144)

## 2018-10-25 LAB — URIC ACID: Uric Acid: 6.7 mg/dL (ref 3.7–8.6)

## 2018-10-25 LAB — VITAMIN D 25 HYDROXY (VIT D DEFICIENCY, FRACTURES): Vit D, 25-Hydroxy: 21.2 ng/mL — ABNORMAL LOW (ref 30.0–100.0)

## 2018-10-25 NOTE — Telephone Encounter (Signed)
Pt advised.  He says he will call back and schedule when it gets closer to three months.   Thanks,   -Mickel Baas

## 2018-10-25 NOTE — Telephone Encounter (Signed)
-----   Message from Birdie Sons, MD sent at 10/25/2018  8:41 AM EDT ----- Uric acid is much better, continue current dose of allopurinol. Kidney functions are a little worse. Need to drink more water. Otherwise labs are normal. Need to schedule follow up in 3 months.

## 2018-10-26 ENCOUNTER — Telehealth: Payer: Self-pay

## 2018-10-26 MED ORDER — DOXYCYCLINE HYCLATE 100 MG PO TABS: 100.0000 mg | ORAL_TABLET | Freq: Two times a day (BID) | ORAL | 0 refills | Status: DC

## 2018-10-26 NOTE — Telephone Encounter (Signed)
Pt was seen 10/24/2018 for a skin lesion on his cheek.  He was prescribed Keflex. Pt states the antibiotic is causing severe diarrhea.  He would like a different one sent to [facility].     Thanks,   -Mickel Baas

## 2018-10-29 NOTE — Patient Instructions (Signed)
.   Please review the attached list of medications and notify my office if there are any errors.   . Please bring all of your medications to every appointment so we can make sure that our medication list is the same as yours.   

## 2018-11-07 ENCOUNTER — Other Ambulatory Visit: Payer: Self-pay

## 2018-11-07 DIAGNOSIS — M5489 Other dorsalgia: Secondary | ICD-10-CM

## 2018-11-07 MED ORDER — HYDROCODONE-ACETAMINOPHEN 10-325 MG PO TABS: 1.0000 | ORAL_TABLET | Freq: Three times a day (TID) | ORAL | 0 refills | Status: DC | PRN

## 2018-11-25 ENCOUNTER — Ambulatory Visit (INDEPENDENT_AMBULATORY_CARE_PROVIDER_SITE_OTHER): Payer: PPO | Admitting: Family Medicine

## 2018-11-25 ENCOUNTER — Other Ambulatory Visit: Payer: Self-pay

## 2018-11-25 ENCOUNTER — Encounter: Payer: Self-pay | Admitting: Family Medicine

## 2018-11-25 VITALS — BP 117/81 | HR 76 | Temp 98.7°F | Wt 188.0 lb

## 2018-11-25 DIAGNOSIS — R110 Nausea: Secondary | ICD-10-CM | POA: Diagnosis not present

## 2018-11-25 DIAGNOSIS — N185 Chronic kidney disease, stage 5: Secondary | ICD-10-CM | POA: Diagnosis not present

## 2018-11-25 MED ORDER — ONDANSETRON 4 MG PO TBDP: 4.0000 mg | ORAL_TABLET | Freq: Three times a day (TID) | ORAL | 0 refills | Status: DC | PRN

## 2018-11-25 NOTE — Patient Instructions (Signed)
.   Please review the attached list of medications and notify my office if there are any errors.   . Please bring all of your medications to every appointment so we can make sure that our medication list is the same as yours.   
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
## 2018-11-29 ENCOUNTER — Telehealth: Payer: Self-pay

## 2018-11-29 DIAGNOSIS — M109 Gout, unspecified: Secondary | ICD-10-CM

## 2018-11-29 MED ORDER — PREDNISONE 10 MG PO TABS: ORAL_TABLET | ORAL | 1 refills | Status: DC

## 2018-11-29 NOTE — Telephone Encounter (Signed)
Patient is requesting a prescription for Prednisone for right elbow inflammation that may possibly be gout.

## 2018-12-01 ENCOUNTER — Ambulatory Visit (INDEPENDENT_AMBULATORY_CARE_PROVIDER_SITE_OTHER): Payer: PPO | Admitting: *Deleted

## 2018-12-01 ENCOUNTER — Other Ambulatory Visit: Payer: Self-pay

## 2018-12-01 DIAGNOSIS — I472 Ventricular tachycardia, unspecified: Secondary | ICD-10-CM

## 2018-12-01 DIAGNOSIS — I255 Ischemic cardiomyopathy: Secondary | ICD-10-CM

## 2018-12-01 LAB — CUP PACEART REMOTE DEVICE CHECK
Battery Remaining Longevity: 57 mo
Battery Voltage: 2.99 V
Brady Statistic RV Percent Paced: 0.01 %
Date Time Interrogation Session: 20200430042204
HighPow Impedance: 85 Ohm
Implantable Lead Implant Date: 20150318
Implantable Lead Location: 753860
Implantable Lead Model: 181
Implantable Lead Serial Number: 327195
Implantable Pulse Generator Implant Date: 20150318
Lead Channel Impedance Value: 589 Ohm
Lead Channel Impedance Value: 589 Ohm
Lead Channel Pacing Threshold Amplitude: 0.875 V
Lead Channel Pacing Threshold Pulse Width: 0.4 ms
Lead Channel Sensing Intrinsic Amplitude: 7.5 mV
Lead Channel Setting Pacing Amplitude: 2 V
Lead Channel Setting Pacing Pulse Width: 0.4 ms
Lead Channel Setting Sensing Sensitivity: 0.3 mV

## 2018-12-08 ENCOUNTER — Other Ambulatory Visit: Payer: Self-pay | Admitting: Family Medicine

## 2018-12-08 ENCOUNTER — Ambulatory Visit: Payer: PPO

## 2018-12-08 ENCOUNTER — Telehealth: Payer: Self-pay

## 2018-12-08 DIAGNOSIS — M5489 Other dorsalgia: Secondary | ICD-10-CM

## 2018-12-08 MED ORDER — HYDROCODONE-ACETAMINOPHEN 10-325 MG PO TABS: 1.0000 | ORAL_TABLET | Freq: Three times a day (TID) | ORAL | 0 refills | Status: DC | PRN

## 2018-12-08 NOTE — Telephone Encounter (Signed)
Patient advised as below and agrees with plan. Patient states he is going to call back tomorrow to schedule a follow up office visit with Dr. Caryn Section.

## 2018-12-08 NOTE — Telephone Encounter (Signed)
Pt needs a refill on   Hydrocodone  10-325  [facility]  [pt says he is retaining fluid even with the Lasix  Thanks teri

## 2018-12-08 NOTE — Telephone Encounter (Signed)
Patient called reporting that he has been retaining more fluid in his legs, even while taking Lasix. He says he has been on Lasix for 23 years, and he feels that it isn't helping him.  Patient reports that he is currently taking 40mg  of Lasix every other day. The directions in his chart states 1 tablet daily as needed. Patient says that his Cardiologist told him that he should not take Lasix everyday since he has a history of A-FIB, and the lasix could dehydrate him and aggravate that problem. He has gained 8 pounds within the past 3 weeks.  He says when he takes the Lasix, it usually knocks off 1 pound, but he regains that pound the next day.His blood pressure has been averaging 116/80.  Please advise of what patient should do.

## 2018-12-08 NOTE — Telephone Encounter (Signed)
Patient request a refill of the Hydrocodone.

## 2018-12-08 NOTE — Telephone Encounter (Signed)
With his heart failure and significant chronic kidney disease, probably need the Lasix 40 mg increased to daily use for a few days to help the swelling to come down. Should continue to drink fluids when taking diuretics to prevent dehydration. Be sure to restrict salt intake so this won't cause more fluid retention. Should follow up with Dr. Caryn Section and have recheck of labs.

## 2018-12-08 NOTE — Telephone Encounter (Signed)
Patient is returning Roshena's call.

## 2018-12-09 NOTE — Progress Notes (Signed)
Remote ICD transmission.   

## 2018-12-14 DIAGNOSIS — L578 Other skin changes due to chronic exposure to nonionizing radiation: Secondary | ICD-10-CM | POA: Diagnosis not present

## 2018-12-14 DIAGNOSIS — D692 Other nonthrombocytopenic purpura: Secondary | ICD-10-CM | POA: Diagnosis not present

## 2018-12-14 DIAGNOSIS — L820 Inflamed seborrheic keratosis: Secondary | ICD-10-CM | POA: Diagnosis not present

## 2018-12-16 ENCOUNTER — Encounter: Payer: Self-pay | Admitting: Family Medicine

## 2018-12-16 ENCOUNTER — Other Ambulatory Visit: Payer: Self-pay

## 2018-12-16 ENCOUNTER — Ambulatory Visit (INDEPENDENT_AMBULATORY_CARE_PROVIDER_SITE_OTHER): Payer: PPO | Admitting: Family Medicine

## 2018-12-16 ENCOUNTER — Telehealth: Payer: Self-pay

## 2018-12-16 VITALS — BP 118/72 | HR 72 | Temp 98.9°F | Resp 16 | Ht 70.0 in | Wt 192.0 lb

## 2018-12-16 DIAGNOSIS — R110 Nausea: Secondary | ICD-10-CM

## 2018-12-16 DIAGNOSIS — N185 Chronic kidney disease, stage 5: Secondary | ICD-10-CM

## 2018-12-16 DIAGNOSIS — K5909 Other constipation: Secondary | ICD-10-CM

## 2018-12-16 DIAGNOSIS — R609 Edema, unspecified: Secondary | ICD-10-CM

## 2018-12-16 MED ORDER — PROMETHAZINE HCL 25 MG PO TABS: 25.0000 mg | ORAL_TABLET | Freq: Three times a day (TID) | ORAL | 0 refills | Status: DC | PRN

## 2018-12-16 MED ORDER — LINACLOTIDE 145 MCG PO CAPS: 290.0000 ug | ORAL_CAPSULE | Freq: Every day | ORAL | 0 refills | Status: DC

## 2018-12-16 NOTE — Telephone Encounter (Signed)
Pt is checking on this. Please advise.  Thanks,   -Mickel Baas

## 2018-12-16 NOTE — Progress Notes (Signed)
Patient: Richard Cook Male    DOB: May 30, 1956   63 y.o.   MRN: [id number] Visit Date: 12/16/2018  Today's Provider: Lelon Huh, MD   Chief Complaint  Patient presents with  . Edema   Subjective:     HPI Patient comes in today to discuss his diuretic (furosemide 40mg  once daily). He reports that he does not feel that it is helping. He reports that both legs are swelling, and he has gained weight in the last 3 weeks.  Wt Readings from Last 3 Encounters:  12/16/18 192 lb (87.1 kg)  11/25/18 188 lb (85.3 kg)  10/24/18 189 lb (85.7 kg)   BP Readings from Last 3 Encounters:  12/16/18 118/72  11/25/18 117/81  10/24/18 120/80   Labs from 3-23 showed worsening kidney functions with creatinine of 3.38. referral was entered to nephrology, but our referral coordinator has not been able to contact patient and he has not called back after being advised of need to see nephrology. .  He has remained very physically active, is hiking 3 times a week.   He also states he is constipated all the time. Take metamucil every day and has tried Senna which he states made him sick on his stomach.     Allergies  Allergen Reactions  . Codeine Other (See Comments)    Tolerates Hydrocodone (??)  . Colchicine Nausea And Vomiting     Current Outpatient Medications:  .  allopurinol (ZYLOPRIM) 300 MG tablet, Take 1 tablet (300 mg total) by mouth daily., Disp: 90 tablet, Rfl: 1 .  amiodarone (PACERONE) 200 MG tablet, Take 1.5 tablets (300 mg total) by mouth daily. You may take this in the morning, Disp: 125 tablet, Rfl: 3 .  aspirin EC 81 MG tablet, Take 81 mg by mouth daily., Disp: , Rfl:  .  BEE POLLEN PO, Take 1 capsule by mouth daily. , Disp: , Rfl:  .  clonazePAM (KLONOPIN) 1 MG tablet, Take one tablet every afternoon and 1 1/2 at bedtime, Disp: 75 tablet, Rfl: 5 .  furosemide (LASIX) 40 MG tablet, Take 1 tablet (40 mg total) by mouth daily as needed for fluid., Disp: 30 tablet,  Rfl: 12 .  HYDROcodone-acetaminophen (NORCO) 10-325 MG tablet, Take 1 tablet by mouth every 8 (eight) hours as needed., Disp: 60 tablet, Rfl: 0 .  mexiletine (MEXITIL) 200 MG capsule, Take 1 capsule (200 mg total) by mouth 3 (three) times daily., Disp: 180 capsule, Rfl: 3 .  Omega-3 Fatty Acids (FISH OIL PO), Take 1 capsule by mouth daily., Disp: , Rfl:  .  ondansetron (ZOFRAN ODT) 4 MG disintegrating tablet, Take 1 tablet (4 mg total) by mouth every 8 (eight) hours as needed for nausea or vomiting., Disp: 20 tablet, Rfl: 0 .  tadalafil (CIALIS) 10 MG tablet, Take 1 tablet (10 mg total) by mouth daily as needed for erectile dysfunction., Disp: 10 tablet, Rfl: 2  Review of Systems  Constitutional: Negative for activity change, appetite change, fatigue, fever and unexpected weight change.  Respiratory: Negative for cough and shortness of breath.   Cardiovascular: Positive for leg swelling. Negative for chest pain and palpitations.  Musculoskeletal: Positive for arthralgias. Negative for myalgias, neck pain and neck stiffness.  Skin: Negative for color change, pallor, rash and wound.  Neurological: Negative for dizziness, tremors, syncope, weakness, light-headedness and headaches.    Social History   Tobacco Use  . Smoking status: Never Smoker  . Smokeless tobacco: Never Used  Substance Use Topics  . Alcohol use: No    Comment: 10/07/2016 "quit in the early 1990s"      Objective:   BP 118/72 (BP Location: Left Arm, Patient Position: Sitting, Cuff Size: Normal)   Pulse 72   Temp 98.9 F (37.2 C)   Resp 16   Ht 5\' 10"  (1.778 m)   Wt 192 lb (87.1 kg)   SpO2 95%   BMI 27.55 kg/m  Vitals:   12/16/18 1344  BP: 118/72  Pulse: 72  Resp: 16  Temp: 98.9 F (37.2 C)  SpO2: 95%  Weight: 192 lb (87.1 kg)  Height: 5\' 10"  (1.778 m)     Physical Exam   General Appearance:    Alert, cooperative, no distress  Eyes:    PERRL, conjunctiva/corneas clear, EOM's intact       Lungs:      Clear to auscultation bilaterally, respirations unlabored  Heart:    Regular rate and rhythm  Ext::   Wearing compression stocking, 1+ pitting above indentation line of stockings.          Assessment & Plan    1. Edema, unspecified type Likely secondary to CKD as below. Continue wearing support stocking during the day   2. 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, stage 5 [facility]) Given patient telephone number for referral coordinator and stressed importance of following up with renal.   3. Chronic constipation try- linaclotide (LINZESS) 145 MCG CAPS capsule; Take 2 capsules (290 mcg total) by mouth daily before breakfast.  Dispense: 20 capsule; Refill: 0 Has not had colon screening, anticipate referral when he is feeling better.   4. Nausea try - promethazine (PHENERGAN) 25 MG tablet; Take 1 tablet (25 mg total) by mouth every 8 (eight) hours as needed for nausea or vomiting.  Dispense: 20 tablet; Refill: 0      Lelon Huh, MD  [facility]

## 2018-12-16 NOTE — Telephone Encounter (Signed)
Please review. Which medication would you like the patient to have?

## 2018-12-16 NOTE — Telephone Encounter (Signed)
Pt was just seen today in the office.  He states he was prescribed promethazine for "Stomach Issue"  He states he is at the pharmacy and they do not have the prescription.  After reviewing the chart I advised him it looked like linzess.  He states he has used that in the past and it did not help.  Pt states he is at the pharmacy now and would like a call back.  Please advise.  Thanks,   -Mickel Baas

## 2018-12-16 NOTE — Telephone Encounter (Signed)
Prescription should be there, was seen a few hours ago.

## 2018-12-16 NOTE — Patient Instructions (Addendum)
.   Please review the attached list of medications and notify my office if there are any errors.    Please call our referral coordinator at [phone] as soon as possible regarding your referral to a kidney specialist

## 2018-12-27 ENCOUNTER — Telehealth: Payer: Self-pay | Admitting: Student

## 2018-12-27 MED ORDER — METOPROLOL TARTRATE 25 MG PO TABS: ORAL_TABLET | ORAL | 3 refills | Status: DC

## 2018-12-27 NOTE — Telephone Encounter (Signed)
-----   Message from [name], MD sent at 12/26/2018 10:20 AM EDT ----- Remote reviewed. This remote is abnormal for freq fast HR which I assume track with him going hiking Suggest he take lopressor 25 mg po q6 while hiking

## 2018-12-30 ENCOUNTER — Telehealth: Payer: Self-pay | Admitting: Family Medicine

## 2018-12-30 NOTE — Telephone Encounter (Signed)
Please check with patient and see if he was able to get appointment with nephrology, we've been trying to get him in for a few months now.

## 2019-01-02 ENCOUNTER — Other Ambulatory Visit: Payer: Self-pay

## 2019-01-02 DIAGNOSIS — K5909 Other constipation: Secondary | ICD-10-CM

## 2019-01-02 MED ORDER — LINACLOTIDE 145 MCG PO CAPS: 290.0000 ug | ORAL_CAPSULE | Freq: Every day | ORAL | 1 refills | Status: DC

## 2019-01-02 NOTE — Telephone Encounter (Signed)
LMTCB 01/02/2019  Thanks,   -Leanord Thibeau  

## 2019-01-02 NOTE — Telephone Encounter (Signed)
Spoke with pt and he has a appt sometime next week but he cant remember date right off.  dbs

## 2019-01-05 ENCOUNTER — Other Ambulatory Visit: Payer: Self-pay | Admitting: Student

## 2019-01-05 ENCOUNTER — Other Ambulatory Visit: Payer: Self-pay

## 2019-01-05 DIAGNOSIS — K5909 Other constipation: Secondary | ICD-10-CM

## 2019-01-06 MED ORDER — LINACLOTIDE 145 MCG PO CAPS: 290.0000 ug | ORAL_CAPSULE | Freq: Every day | ORAL | 3 refills | Status: DC

## 2019-01-10 ENCOUNTER — Other Ambulatory Visit: Payer: Self-pay | Admitting: Family Medicine

## 2019-01-10 DIAGNOSIS — M5489 Other dorsalgia: Secondary | ICD-10-CM

## 2019-01-10 NOTE — Telephone Encounter (Signed)
Pt needs a refill on  Hydrocodone 10-325  [facility]

## 2019-01-11 ENCOUNTER — Other Ambulatory Visit: Payer: Self-pay | Admitting: Family Medicine

## 2019-01-11 DIAGNOSIS — F411 Generalized anxiety disorder: Secondary | ICD-10-CM

## 2019-01-11 MED ORDER — HYDROCODONE-ACETAMINOPHEN 10-325 MG PO TABS: 1.0000 | ORAL_TABLET | Freq: Three times a day (TID) | ORAL | 0 refills | Status: DC | PRN

## 2019-01-23 ENCOUNTER — Other Ambulatory Visit: Payer: Self-pay

## 2019-01-23 ENCOUNTER — Telehealth: Payer: PPO | Admitting: Internal Medicine

## 2019-01-24 NOTE — Telephone Encounter (Signed)
Patient was given linzess samples at last visit for constipation. Please check and see if these worked and if he needs prescription.   Also, was supposed to have seen nephrologist last week, but we haven't gotten report. Please see if this appointment was kept.

## 2019-01-24 NOTE — Telephone Encounter (Signed)
LMTCB 01/24/2019  Thanks,   -Mickel Baas

## 2019-01-27 ENCOUNTER — Other Ambulatory Visit: Payer: Self-pay | Admitting: Student

## 2019-01-27 NOTE — Telephone Encounter (Signed)
Spoke with [name].  He states his nephrologist apt is July 13th.     He also states he is having trouble with both diarrhea and constipation.  The linzess is not really helping much.    Thanks,   -Mickel Baas

## 2019-02-09 ENCOUNTER — Other Ambulatory Visit: Payer: Self-pay

## 2019-02-09 DIAGNOSIS — M5489 Other dorsalgia: Secondary | ICD-10-CM

## 2019-02-09 NOTE — Telephone Encounter (Signed)
Patient request refill

## 2019-02-10 MED ORDER — HYDROCODONE-ACETAMINOPHEN 10-325 MG PO TABS: 1.0000 | ORAL_TABLET | Freq: Three times a day (TID) | ORAL | 0 refills | Status: DC | PRN

## 2019-02-16 DIAGNOSIS — R809 Proteinuria, unspecified: Secondary | ICD-10-CM | POA: Diagnosis not present

## 2019-02-16 DIAGNOSIS — I129 Hypertensive chronic kidney disease with stage 1 through stage 4 chronic kidney disease, or unspecified chronic kidney disease: Secondary | ICD-10-CM | POA: Diagnosis not present

## 2019-02-16 DIAGNOSIS — N184 Chronic kidney disease, stage 4 (severe): Secondary | ICD-10-CM | POA: Diagnosis not present

## 2019-02-16 DIAGNOSIS — M109 Gout, unspecified: Secondary | ICD-10-CM | POA: Diagnosis not present

## 2019-02-23 ENCOUNTER — Other Ambulatory Visit: Payer: Self-pay | Admitting: Nephrology

## 2019-02-23 DIAGNOSIS — N184 Chronic kidney disease, stage 4 (severe): Secondary | ICD-10-CM

## 2019-02-27 DIAGNOSIS — L578 Other skin changes due to chronic exposure to nonionizing radiation: Secondary | ICD-10-CM | POA: Diagnosis not present

## 2019-02-27 DIAGNOSIS — L820 Inflamed seborrheic keratosis: Secondary | ICD-10-CM | POA: Diagnosis not present

## 2019-02-27 DIAGNOSIS — L821 Other seborrheic keratosis: Secondary | ICD-10-CM | POA: Diagnosis not present

## 2019-03-02 ENCOUNTER — Ambulatory Visit (INDEPENDENT_AMBULATORY_CARE_PROVIDER_SITE_OTHER): Payer: PPO | Admitting: *Deleted

## 2019-03-02 DIAGNOSIS — I255 Ischemic cardiomyopathy: Secondary | ICD-10-CM | POA: Diagnosis not present

## 2019-03-02 LAB — CUP PACEART REMOTE DEVICE CHECK
Battery Remaining Longevity: 62 mo
Battery Voltage: 2.99 V
Brady Statistic RV Percent Paced: 0.11 %
Date Time Interrogation Session: 20200730062722
HighPow Impedance: 83 Ohm
Implantable Lead Implant Date: 20150318
Implantable Lead Location: 753860
Implantable Lead Model: 181
Implantable Lead Serial Number: 327195
Implantable Pulse Generator Implant Date: 20150318
Lead Channel Impedance Value: 589 Ohm
Lead Channel Impedance Value: 608 Ohm
Lead Channel Pacing Threshold Amplitude: 1 V
Lead Channel Pacing Threshold Pulse Width: 0.4 ms
Lead Channel Sensing Intrinsic Amplitude: 9 mV
Lead Channel Setting Pacing Amplitude: 2 V
Lead Channel Setting Pacing Pulse Width: 0.4 ms
Lead Channel Setting Sensing Sensitivity: 0.3 mV

## 2019-03-06 ENCOUNTER — Ambulatory Visit: Payer: PPO

## 2019-03-09 ENCOUNTER — Other Ambulatory Visit: Payer: Self-pay | Admitting: Family Medicine

## 2019-03-09 DIAGNOSIS — M1A09X0 Idiopathic chronic gout, multiple sites, without tophus (tophi): Secondary | ICD-10-CM

## 2019-03-10 ENCOUNTER — Encounter: Payer: Self-pay | Admitting: Cardiology

## 2019-03-10 NOTE — Progress Notes (Signed)
Remote ICD transmission.   

## 2019-03-13 ENCOUNTER — Other Ambulatory Visit: Payer: Self-pay

## 2019-03-13 ENCOUNTER — Other Ambulatory Visit: Payer: Self-pay | Admitting: Internal Medicine

## 2019-03-13 DIAGNOSIS — M5489 Other dorsalgia: Secondary | ICD-10-CM

## 2019-03-13 MED ORDER — HYDROCODONE-ACETAMINOPHEN 10-325 MG PO TABS: 1.0000 | ORAL_TABLET | Freq: Three times a day (TID) | ORAL | 0 refills | Status: DC | PRN

## 2019-03-14 ENCOUNTER — Ambulatory Visit: Payer: PPO | Attending: Nephrology

## 2019-03-16 MED ORDER — MEXILETINE HCL 250 MG PO CAPS: 250.0000 mg | ORAL_CAPSULE | Freq: Three times a day (TID) | ORAL | 3 refills | Status: DC

## 2019-03-16 NOTE — Telephone Encounter (Signed)
Need refill for mexiletine but it is on backorder. Pharmacy would like an alternative sent in please.

## 2019-03-16 NOTE — Telephone Encounter (Signed)
Spoke with pt and he agrees to try a different pharmacy for his mexilitine refill.   Follow up appt was also made at this time.

## 2019-03-23 ENCOUNTER — Telehealth: Payer: Self-pay | Admitting: Family Medicine

## 2019-03-23 NOTE — Telephone Encounter (Signed)
lmtcb

## 2019-03-23 NOTE — Telephone Encounter (Signed)
Pt needing a call back regarding the linaclotide (LINZESS) 145 MCG CAPS capsule. He started taking it for constipation but when he takes it he has severe diarrhea. Pt stops taking it and he goes back to constipation.  Please advise.  Thanks, [employer]

## 2019-03-23 NOTE — Telephone Encounter (Signed)
Need to reduce dose to 72 mg a day, #30, rf x 3, or one take every other day instead of every day.

## 2019-03-24 ENCOUNTER — Ambulatory Visit
Admission: RE | Admit: 2019-03-24 | Discharge: 2019-03-24 | Disposition: A | Discharge status: Home / Self Care | Payer: PPO | Source: Ambulatory Visit | Attending: Nephrology | Admitting: Nephrology

## 2019-03-24 ENCOUNTER — Other Ambulatory Visit: Payer: Self-pay

## 2019-03-24 DIAGNOSIS — N184 Chronic kidney disease, stage 4 (severe): Secondary | ICD-10-CM | POA: Diagnosis not present

## 2019-03-24 DIAGNOSIS — N200 Calculus of kidney: Secondary | ICD-10-CM | POA: Diagnosis not present

## 2019-03-24 MED ORDER — LINACLOTIDE 72 MCG PO CAPS: 72.0000 ug | ORAL_CAPSULE | Freq: Every day | ORAL | 3 refills | Status: DC

## 2019-03-24 NOTE — Telephone Encounter (Signed)
Pt advised.  He would like to try 14mcg a day.  [facility]   Thanks,   -Mickel Baas

## 2019-03-27 DIAGNOSIS — I129 Hypertensive chronic kidney disease with stage 1 through stage 4 chronic kidney disease, or unspecified chronic kidney disease: Secondary | ICD-10-CM | POA: Diagnosis not present

## 2019-03-27 DIAGNOSIS — N2581 Secondary hyperparathyroidism of renal origin: Secondary | ICD-10-CM | POA: Diagnosis not present

## 2019-03-27 DIAGNOSIS — N179 Acute kidney failure, unspecified: Secondary | ICD-10-CM | POA: Diagnosis not present

## 2019-03-27 DIAGNOSIS — N184 Chronic kidney disease, stage 4 (severe): Secondary | ICD-10-CM | POA: Diagnosis not present

## 2019-04-18 ENCOUNTER — Telehealth: Payer: Self-pay | Admitting: Family Medicine

## 2019-04-18 DIAGNOSIS — F411 Generalized anxiety disorder: Secondary | ICD-10-CM

## 2019-04-18 DIAGNOSIS — Z1211 Encounter for screening for malignant neoplasm of colon: Secondary | ICD-10-CM

## 2019-04-18 DIAGNOSIS — M5489 Other dorsalgia: Secondary | ICD-10-CM

## 2019-04-18 MED ORDER — CLONAZEPAM 1 MG PO TABS: ORAL_TABLET | ORAL | 2 refills | Status: DC

## 2019-04-18 MED ORDER — HYDROCODONE-ACETAMINOPHEN 10-325 MG PO TABS: 1.0000 | ORAL_TABLET | Freq: Three times a day (TID) | ORAL | 0 refills | Status: DC | PRN

## 2019-04-18 NOTE — Telephone Encounter (Signed)
Please review last time patient was seen for Anxiety was 07/11/18. KW

## 2019-04-18 NOTE — Telephone Encounter (Signed)
Pt calling back regarding a message about him needing a colon exam.  Please call pt back.  Thanks, [employer]

## 2019-04-18 NOTE — Telephone Encounter (Signed)
Pt needing a refill on:  HYDROcodone-acetaminophen (NORCO) 10-325 MG tablet  clonazePAM (KLONOPIN) 1 MG tablet   [facility]:  [id number] [address] [place] [phone] (Phone) [phone] (Fax)   Thanks, [place]

## 2019-04-18 NOTE — Telephone Encounter (Signed)
Patient advised and agrees to do Cologaurd. Please sign order. I placed order on your desk. Please review order. It  looks slightly different than what I am used to seeing.

## 2019-04-18 NOTE — Telephone Encounter (Signed)
I would recommend a Cologuard for him. Considering his cardiac history it would be safer than a screening colonoscopy.

## 2019-04-18 NOTE — Telephone Encounter (Signed)
Patient states he received a call from someone in our office who told him that he was due for a colon cancer screen. I didn't see any message or documentation about this. Patient would like to have a colon cancer screening done. He says he has never had a colonoscopy. Is it ok to order referral to GI for colonoscopy?

## 2019-05-01 DIAGNOSIS — Z1211 Encounter for screening for malignant neoplasm of colon: Secondary | ICD-10-CM | POA: Diagnosis not present

## 2019-05-04 ENCOUNTER — Telehealth: Payer: Self-pay | Admitting: Internal Medicine

## 2019-05-04 NOTE — Telephone Encounter (Signed)
Pt c/o medication issue:  1. Name of Medication: metoprolol tartrate (LOPRESSOR) 25 MG tablet  2. How are you currently taking this medication (dosage and times per day)? As needed. He takes it on the days he goes hiking   3. Are you having a reaction (difficulty breathing--STAT)?   4. What is your medication issue? HR continues to climb when he goes hiking. It went as high as 86 yesterday. Dr. [name] put him on this medication because without it, his Hr was in the 100s range, and Dr. [name] wanted to lower his HR.  He does have some labored breathing that was not as noticeable in the past.  He would just like to know what to do

## 2019-05-08 LAB — COLOGUARD: Cologuard: NEGATIVE

## 2019-05-08 NOTE — Telephone Encounter (Signed)
I spoke to the patient who is concerned because his HR elevates into the 80s while hiking and climbing.  He is experiencing labored breathing, while in the 80s.  He is prescribed Lopressor 25 mg as needed for elevated HR.  He is under the impression that 80 is too high for him, but I mentioned with activity, HR will increase.    He takes his Lopressor prior to activity otherwise his HR may increase to the 100s and worries him.  He wanted to know if Dr [name] has other recommendations.

## 2019-05-10 NOTE — Telephone Encounter (Signed)
Agree HR 80s while hiking is ok, it may in fact be too slow,and there has been a slowing of his HR over the last few months prob related to meds KEEP HIKING :)))

## 2019-05-12 NOTE — Telephone Encounter (Signed)
LPM with Dr Olin Pia recommendation.  10/9

## 2019-05-15 ENCOUNTER — Telehealth: Payer: Self-pay

## 2019-05-15 NOTE — Telephone Encounter (Signed)
Patient advised.KW 

## 2019-05-15 NOTE — Telephone Encounter (Signed)
-----   Message from Birdie Sons, MD sent at 05/15/2019  4:44 PM EDT ----- Cologuard test is negative. Repeat in 3 years.

## 2019-05-18 ENCOUNTER — Telehealth: Payer: Self-pay | Admitting: Pain Medicine

## 2019-05-18 NOTE — Telephone Encounter (Signed)
Patient is having significant neck and shoulder pain, would like to come in for a procedure. Need to know what kind of procedure we can set him up for please.

## 2019-05-18 NOTE — Telephone Encounter (Signed)
Attempted to call patient to discuss pain area so that we can get a procedure order from Dr Dossie Arbour. Left message to call office.

## 2019-05-19 ENCOUNTER — Other Ambulatory Visit: Payer: Self-pay

## 2019-05-19 DIAGNOSIS — M5489 Other dorsalgia: Secondary | ICD-10-CM

## 2019-05-19 MED ORDER — HYDROCODONE-ACETAMINOPHEN 10-325 MG PO TABS: 1.0000 | ORAL_TABLET | Freq: Three times a day (TID) | ORAL | 0 refills | Status: DC | PRN

## 2019-05-19 NOTE — Telephone Encounter (Addendum)
Patient left vmail stating his pain in in lower small of back mostly right sided. Not sure where pain is as patient has given 2 different sites.

## 2019-05-22 NOTE — Telephone Encounter (Signed)
Left vmail informing patient of appt scheduled for Wed at 2:30. Asked him to confirm this appt.

## 2019-05-22 NOTE — Telephone Encounter (Signed)
I do not see where this patient was scheduled for a follow up visit from his last procedure on 03-30-19 (should have been 2 week follow up)  He is having new problem and needs to have an appointment to discuss last procedure visit and to schedule for a new procedure.  His pain is now located in his low back, mostly right side.  Patient denies radiation into legs.

## 2019-05-23 ENCOUNTER — Encounter: Payer: Self-pay | Admitting: Pain Medicine

## 2019-05-24 ENCOUNTER — Ambulatory Visit: Payer: PPO | Attending: Pain Medicine | Admitting: Pain Medicine

## 2019-05-24 ENCOUNTER — Other Ambulatory Visit: Payer: Self-pay

## 2019-05-24 DIAGNOSIS — M545 Low back pain: Secondary | ICD-10-CM | POA: Diagnosis not present

## 2019-05-24 DIAGNOSIS — M62830 Muscle spasm of back: Secondary | ICD-10-CM | POA: Diagnosis not present

## 2019-05-24 DIAGNOSIS — M5137 Other intervertebral disc degeneration, lumbosacral region: Secondary | ICD-10-CM | POA: Diagnosis not present

## 2019-05-24 DIAGNOSIS — M5134 Other intervertebral disc degeneration, thoracic region: Secondary | ICD-10-CM | POA: Insufficient documentation

## 2019-05-24 DIAGNOSIS — G8929 Other chronic pain: Secondary | ICD-10-CM | POA: Diagnosis not present
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
## 2019-05-24 NOTE — Patient Instructions (Signed)
____________________________________________________________________________________________  Preparing for your procedure (without sedation)  Procedure appointments are limited to planned procedures: . No Prescription Refills. . No disability issues will be discussed. . No medication changes will be discussed.  Instructions: . Oral Intake: Do not eat or drink anything for at least 3 hours prior to your procedure. . Transportation: Unless otherwise stated by your physician, you may drive yourself after the procedure. . Blood Pressure Medicine: Take your blood pressure medicine with a sip of water the morning of the procedure. . Blood thinners: Notify our staff if you are taking any blood thinners. Depending on which one you take, there will be specific instructions on how and when to stop it. . Diabetics on insulin: Notify the staff so that you can be scheduled 1st case in the morning. If your diabetes requires high dose insulin, take only  of your normal insulin dose the morning of the procedure and notify the staff that you have done so. . Preventing infections: Shower with an antibacterial soap the morning of your procedure.  . Build-up your immune system: Take 1000 mg of Vitamin C with every meal (3 times a day) the day prior to your procedure. . Antibiotics: Inform the staff if you have a condition or reason that requires you to take antibiotics before dental procedures. . Pregnancy: If you are pregnant, call and cancel the procedure. . Sickness: If you have a cold, fever, or any active infections, call and cancel the procedure. . Arrival: You must be in the facility at least 30 minutes prior to your scheduled procedure. . Children: Do not bring any children with you. . Dress appropriately: Bring dark clothing that you would not mind if they get stained. . Valuables: Do not bring any jewelry or valuables.  Reasons to call and reschedule or cancel your procedure: (Following these  recommendations will minimize the risk of a serious complication.) . Surgeries: Avoid having procedures within 2 weeks of any surgery. (Avoid for 2 weeks before or after any surgery). . Flu Shots: Avoid having procedures within 2 weeks of a flu shots or . (Avoid for 2 weeks before or after immunizations). . Barium: Avoid having a procedure within 7-10 days after having had a radiological study involving the use of radiological contrast. (Myelograms, Barium swallow or enema study). . Heart attacks: Avoid any elective procedures or surgeries for the initial 6 months after a "Myocardial Infarction" (Heart Attack). . Blood thinners: It is imperative that you stop these medications before procedures. Let us know if you if you take any blood thinner.  . Infection: Avoid procedures during or within two weeks of an infection (including chest colds or gastrointestinal problems). Symptoms associated with infections include: Localized redness, fever, chills, night sweats or profuse sweating, burning sensation when voiding, cough, congestion, stuffiness, runny nose, sore throat, diarrhea, nausea, vomiting, cold or Flu symptoms, recent or current infections. It is specially important if the infection is over the area that we intend to treat. . Heart and lung problems: Symptoms that may suggest an active cardiopulmonary problem include: cough, chest pain, breathing difficulties or shortness of breath, dizziness, ankle swelling, uncontrolled high or unusually low blood pressure, and/or palpitations. If you are experiencing any of these symptoms, cancel your procedure and contact your primary care physician for an evaluation.  Remember:  Regular Business hours are:  Monday to Thursday 8:00 AM to 4:00 PM  Provider's Schedule: Reyanne Hussar, MD:  Procedure days: Tuesday and Thursday 7:30 AM to 4:00 PM  Bilal   Lateef, MD:  Procedure days: Monday and Wednesday 7:30 AM to 4:00  PM ____________________________________________________________________________________________    

## 2019-05-25 ENCOUNTER — Ambulatory Visit (INDEPENDENT_AMBULATORY_CARE_PROVIDER_SITE_OTHER): Payer: PPO | Admitting: Internal Medicine

## 2019-05-25 ENCOUNTER — Other Ambulatory Visit: Payer: Self-pay

## 2019-05-25 ENCOUNTER — Encounter: Payer: Self-pay | Admitting: Internal Medicine

## 2019-05-25 VITALS — BP 114/78 | HR 56 | Ht 70.0 in | Wt 187.0 lb

## 2019-05-25 DIAGNOSIS — I5042 Chronic combined systolic (congestive) and diastolic (congestive) heart failure: Secondary | ICD-10-CM

## 2019-05-25 DIAGNOSIS — I472 Ventricular tachycardia, unspecified: Secondary | ICD-10-CM

## 2019-05-25 DIAGNOSIS — I255 Ischemic cardiomyopathy: Secondary | ICD-10-CM

## 2019-05-25 DIAGNOSIS — Z79899 Other long term (current) drug therapy: Secondary | ICD-10-CM | POA: Diagnosis not present

## 2019-05-25 DIAGNOSIS — Z95810 Presence of automatic (implantable) cardiac defibrillator: Secondary | ICD-10-CM

## 2019-05-25 DIAGNOSIS — I959 Hypotension, unspecified: Secondary | ICD-10-CM | POA: Diagnosis not present

## 2019-05-25 NOTE — Patient Instructions (Addendum)
Medication Instructions:  Your physician recommends that you continue on your current medications as directed. Please refer to the Current Medication list given to you today.  *If you need a refill on your cardiac medications before your next appointment, please call your pharmacy*  Labwork: Amiodarone surveillance lab work today: TSH & LFTs If you have labs (blood work) drawn today and your tests are completely normal, you will receive your results only by:  [place] (if you have MyChart) OR  A paper copy in the mail If you have any lab test that is abnormal or we need to change your treatment, we will call you to review the results.  Testing/Procedures: None ordered  Follow-Up: Remote monitoring is used to monitor your Pacemaker or ICD from home. This monitoring reduces the number of office visits required to check your device to one time per year. It allows [place] to keep an eye on the functioning of your device to ensure it is working properly. You are scheduled for a device check from home on 06/01/2019, 08/31/2019. You may send your transmission at any time that day. If you have a wireless device, the transmission will be sent automatically. After your physician reviews your transmission, you will receive a postcard with your next transmission date.  Your physician recommends that you schedule a follow-up appointment in: March with Dr. [name].  You are scheduled for 10/25/2019 @ 1:45 pm  Thank you for choosing [facility]!!   Trinidad Curet, RN [phone]  Any Other Special Instructions Will Be Listed Below (If Applicable).

## 2019-05-25 NOTE — Progress Notes (Signed)
Tried to call patient, had to leave vmail asking him to call for right-sided LESI
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
## 2019-05-26 ENCOUNTER — Telehealth: Payer: Self-pay

## 2019-05-26 LAB — HEPATIC FUNCTION PANEL
ALT: 30 IU/L (ref 0–44)
AST: 30 IU/L (ref 0–40)
Albumin: 4.9 g/dL — ABNORMAL HIGH (ref 3.8–4.8)
Alkaline Phosphatase: 160 IU/L — ABNORMAL HIGH (ref 39–117)
Bilirubin Total: 1 mg/dL (ref 0.0–1.2)
Bilirubin, Direct: 0.22 mg/dL (ref 0.00–0.40)
Total Protein: 8 g/dL (ref 6.0–8.5)

## 2019-05-26 LAB — TSH: TSH: 12.5 u[IU]/mL — ABNORMAL HIGH (ref 0.450–4.500)

## 2019-05-26 MED ORDER — LEVOTHYROXINE SODIUM 25 MCG PO TABS: 25.0000 ug | ORAL_TABLET | Freq: Every day | ORAL | 3 refills | Status: DC

## 2019-05-26 NOTE — Telephone Encounter (Signed)
Per Dr. [name], pt is to begin Synthroid, 84mcg, once daily for elevated TSH. He will follow up with his PCP next week regarding titration.  He had no additional questions.

## 2019-05-28 ENCOUNTER — Telehealth: Payer: Self-pay | Admitting: Family Medicine

## 2019-05-28 DIAGNOSIS — E039 Hypothyroidism, unspecified: Secondary | ICD-10-CM

## 2019-05-28 NOTE — Telephone Encounter (Signed)
Please contact patient and advise we got copy of labs from last week showing that he is hypothyroid. Dr. [name] sent in rx for levothyroxine when patient needs to start, and we need to schedule follow up in 4-5 weeks to recheck thyroid functions.

## 2019-05-29 NOTE — Telephone Encounter (Signed)
LMTCB

## 2019-05-31 NOTE — Telephone Encounter (Signed)
Patient advised as directed below. Patient already started the medication. Stated that he is going to call back to schedule the follow-up appointment. Didn't want to schedule the follow up at this time. Told patient that it was necessary to follow-up and recheck the thyroid functions to see if the medication is helping.

## 2019-06-01 ENCOUNTER — Ambulatory Visit (INDEPENDENT_AMBULATORY_CARE_PROVIDER_SITE_OTHER): Payer: PPO | Admitting: *Deleted

## 2019-06-01 DIAGNOSIS — I472 Ventricular tachycardia, unspecified: Secondary | ICD-10-CM

## 2019-06-01 DIAGNOSIS — I5042 Chronic combined systolic (congestive) and diastolic (congestive) heart failure: Secondary | ICD-10-CM | POA: Diagnosis not present

## 2019-06-01 LAB — CUP PACEART REMOTE DEVICE CHECK
Battery Remaining Longevity: 57 mo
Battery Voltage: 2.98 V
Brady Statistic RV Percent Paced: 0.05 %
Date Time Interrogation Session: 20201029041604
HighPow Impedance: 90 Ohm
Implantable Lead Implant Date: 20150318
Implantable Lead Location: 753860
Implantable Lead Model: 181
Implantable Lead Serial Number: 327195
Implantable Pulse Generator Implant Date: 20150318
Lead Channel Impedance Value: 646 Ohm
Lead Channel Impedance Value: 646 Ohm
Lead Channel Pacing Threshold Amplitude: 0.875 V
Lead Channel Pacing Threshold Pulse Width: 0.4 ms
Lead Channel Sensing Intrinsic Amplitude: 8.75 mV
Lead Channel Setting Pacing Amplitude: 2 V
Lead Channel Setting Pacing Pulse Width: 0.4 ms
Lead Channel Setting Sensing Sensitivity: 0.3 mV

## 2019-06-05 ENCOUNTER — Telehealth: Payer: Self-pay | Admitting: Family Medicine

## 2019-06-05 MED ORDER — PREDNISONE 10 MG PO TABS: ORAL_TABLET | ORAL | 0 refills | Status: AC

## 2019-06-05 NOTE — Telephone Encounter (Signed)
Pt has a flare up of gout. Pt is needing prednisone to be called into:  [id number] [address], [place] [phone] (Phone) [phone] (Fax)   Thanks, [place]

## 2019-06-05 NOTE — Telephone Encounter (Signed)
Patient states he has a gout  flare up in the big toe of his right foot. He complains of pain in his right big toe that goes down into his heel. Flare up started 3 days ago and has worsened. He has been taking Allopurinol 300mg  daily as prescribed. Patient admits to eating more red meats within the last week or so, which may have caused this gout flare up. Patient is requesting a prescription for Prednisone. He says he cannot take Colchicine because it makes him "sick as a dog". Please advise.

## 2019-06-06 ENCOUNTER — Ambulatory Visit: Payer: PPO | Admitting: Pain Medicine

## 2019-06-07 ENCOUNTER — Other Ambulatory Visit: Payer: Self-pay | Admitting: Family Medicine

## 2019-06-07 DIAGNOSIS — G4700 Insomnia, unspecified: Secondary | ICD-10-CM

## 2019-06-15 ENCOUNTER — Other Ambulatory Visit: Payer: Self-pay

## 2019-06-15 ENCOUNTER — Ambulatory Visit
Admission: RE | Admit: 2019-06-15 | Discharge: 2019-06-15 | Disposition: A | Discharge status: Home / Self Care | Payer: PPO | Source: Ambulatory Visit | Attending: Pain Medicine | Admitting: Pain Medicine

## 2019-06-15 ENCOUNTER — Encounter: Payer: Self-pay | Admitting: Pain Medicine

## 2019-06-15 ENCOUNTER — Ambulatory Visit (HOSPITAL_BASED_OUTPATIENT_CLINIC_OR_DEPARTMENT_OTHER): Payer: PPO | Admitting: Pain Medicine

## 2019-06-15 VITALS — BP 140/99 | HR 77 | Temp 98.1°F | Resp 16 | Ht 70.0 in | Wt 185.0 lb

## 2019-06-15 DIAGNOSIS — M47816 Spondylosis without myelopathy or radiculopathy, lumbar region: Secondary | ICD-10-CM

## 2019-06-15 DIAGNOSIS — G8929 Other chronic pain: Secondary | ICD-10-CM

## 2019-06-15 DIAGNOSIS — M5137 Other intervertebral disc degeneration, lumbosacral region: Secondary | ICD-10-CM

## 2019-06-15 DIAGNOSIS — M545 Low back pain, unspecified: Secondary | ICD-10-CM

## 2019-06-15 DIAGNOSIS — M79605 Pain in left leg: Secondary | ICD-10-CM | POA: Diagnosis not present

## 2019-06-15 DIAGNOSIS — M51379 Other intervertebral disc degeneration, lumbosacral region without mention of lumbar back pain or lower extremity pain: Secondary | ICD-10-CM

## 2019-06-15 MED ORDER — LIDOCAINE HCL 2 % IJ SOLN
20.0000 mL | Freq: Once | INTRAMUSCULAR | Status: AC
  Administered 2019-06-15: 13:00:00 400 mg
  Filled 2019-06-15: qty 20

## 2019-06-15 MED ORDER — ROPIVACAINE HCL 2 MG/ML IJ SOLN
2.0000 mL | Freq: Once | INTRAMUSCULAR | Status: AC
  Administered 2019-06-15: 2 mL via EPIDURAL
  Filled 2019-06-15: qty 10

## 2019-06-15 MED ORDER — IOHEXOL 180 MG/ML  SOLN
10.0000 mL | Freq: Once | INTRAMUSCULAR | Status: AC
  Administered 2019-06-15: 10 mL via EPIDURAL
  Filled 2019-06-15: qty 20

## 2019-06-15 MED ORDER — TRIAMCINOLONE ACETONIDE 40 MG/ML IJ SUSP
40.0000 mg | Freq: Once | INTRAMUSCULAR | Status: AC
  Administered 2019-06-15: 40 mg
  Filled 2019-06-15: qty 1

## 2019-06-15 MED ORDER — SODIUM CHLORIDE 0.9% FLUSH
2.0000 mL | Freq: Once | INTRAVENOUS | Status: AC
  Administered 2019-06-15: 2 mL

## 2019-06-15 NOTE — Progress Notes (Signed)
Safety precautions to be maintained throughout the outpatient stay will include: orient to surroundings, keep bed in low position, maintain call bell within reach at all times, provide assistance with transfer out of bed and ambulation.  

## 2019-06-15 NOTE — Patient Instructions (Signed)

## 2019-06-15 NOTE — Progress Notes (Signed)
Patient's Name: Richard Cook  MRN: [id number]  Referring Provider: Birdie Sons, MD  DOB: 11/14/1955  PCP: Birdie Sons, MD  DOS: 06/15/2019  Note by: Gaspar Cola, MD  Service setting: Ambulatory outpatient  Specialty: Interventional Pain Management  Patient type: Established  Location: [facility] (AMB) Pain Management Facility  Visit type: Interventional Procedure   Primary Reason for Visit: Interventional Pain Management Treatment. CC: Back Pain (low and right) and Leg Pain (posterior and to knee)  Procedure:          Anesthesia, Analgesia, Anxiolysis:  Type: Diagnostic Inter-Laminar Epidural Steroid Injection  #1  Region: Lumbar Level: L4-5 Level. Laterality: Right-Sided Paramedial  Type: Local Anesthesia Indication(s): Analgesia         Route: Infiltration ([place]/IM) IV Access: Declined Sedation: Declined  Local Anesthetic: Lidocaine 1-2%  Position: Prone with head of the table was raised to facilitate breathing.   Indications: 1. DDD (degenerative disc disease), lumbosacral   2. Lumbar spondylosis (L4-5 and L5-S1 bulging disks)   3. Chronic lower extremity pain (referred) (Secondary source of pain) (Left)   4. Chronic low back pain (Bilateral) (R>L) w/o sciatica    Pain Score: Pre-procedure: 10-Worst pain ever/10 Post-procedure: 0-No pain/10   Pre-op Assessment:  Richard Cook is a 63 y.o. (year old), male patient, seen today for interventional treatment. He  has a past surgical history that includes Cholecystectomy open (1980's); Skin graft (Left, 1986); Mandible fracture surgery (1986); Vascular surgery (Left, 1986); Tibia fracture surgery (Right, 1986); left heart catheterization with coronary angiogram (N/A, 08/10/2011); percutaneous coronary stent intervention (pci-s) (08/10/2011); left heart catheterization with coronary/graft angiogram (N/A, 10/17/2013); implantable cardioverter defibrillator implant (N/A, 10/18/2013); Split night study (12/19/2015); Fracture  surgery; Inguinal hernia repair (Bilateral, ~ 08/2016); Coronary angioplasty with stent (2013); Cardiac catheterization (2014); Ventricular ablation surgery (10/07/2016); Coronary artery bypass graft (2014); V TACH ABLATION (N/A, 10/07/2016); LEFT HEART CATH AND CORS/GRAFTS ANGIOGRAPHY (N/A, 11/17/2017); and V TACH ABLATION (N/A, 12/08/2017). Richard Cook has a current medication list which includes the following prescription(s): allopurinol, amiodarone, aspirin ec, bee pollen, clonazepam, furosemide, hydrocodone-acetaminophen, levothyroxine, linaclotide, mexiletine, and omega-3 fatty acids. His primarily concern today is the Back Pain (low and right) and Leg Pain (posterior and to knee)  Initial Vital Signs:  Pulse/HCG Rate: 78  Temp: 98.1 F (36.7 C) Resp: 18 BP: (!) 143/94 SpO2: 100 %  BMI: Estimated body mass index is 26.54 kg/m as calculated from the following:   Height as of this encounter: 5\' 10"  (1.778 m).   Weight as of this encounter: 185 lb (83.9 kg).  Risk Assessment: Allergies: Reviewed. He is allergic to codeine and colchicine.  Allergy Precautions: None required Coagulopathies: Reviewed. None identified.  Blood-thinner therapy: None at this time Active Infection(s): Reviewed. None identified. Richard Cook is afebrile  Site Confirmation: Richard Cook was asked to confirm the procedure and laterality before marking the site Procedure checklist: Completed Consent: Before the procedure and under the influence of no sedative(s), amnesic(s), or anxiolytics, the patient was informed of the treatment options, risks and possible complications. To fulfill our ethical and legal obligations, as recommended by the American Medical Association's Code of Ethics, I have informed the patient of my clinical impression; the nature and purpose of the treatment or procedure; the risks, benefits, and possible complications of the intervention; the alternatives, including doing nothing; the risk(s) and  benefit(s) of the alternative treatment(s) or procedure(s); and the risk(s) and benefit(s) of doing nothing. The patient was provided information about the general risks  and possible complications associated with the procedure. These may include, but are not limited to: failure to achieve desired goals, infection, bleeding, organ or nerve damage, allergic reactions, paralysis, and death. In addition, the patient was informed of those risks and complications associated to Spine-related procedures, such as failure to decrease pain; infection (i.e.: Meningitis, epidural or intraspinal abscess); bleeding (i.e.: epidural hematoma, subarachnoid hemorrhage, or any other type of intraspinal or peri-dural bleeding); organ or nerve damage (i.e.: Any type of peripheral nerve, nerve root, or spinal cord injury) with subsequent damage to sensory, motor, and/or autonomic systems, resulting in permanent pain, numbness, and/or weakness of one or several areas of the body; allergic reactions; (i.e.: anaphylactic reaction); and/or death. Furthermore, the patient was informed of those risks and complications associated with the medications. These include, but are not limited to: allergic reactions (i.e.: anaphylactic or anaphylactoid reaction(s)); adrenal axis suppression; blood sugar elevation that in diabetics may result in ketoacidosis or comma; water retention that in patients with history of congestive heart failure may result in shortness of breath, pulmonary edema, and decompensation with resultant heart failure; weight gain; swelling or edema; medication-induced neural toxicity; particulate matter embolism and blood vessel occlusion with resultant organ, and/or nervous system infarction; and/or aseptic necrosis of one or more joints. Finally, the patient was informed that Medicine is not an exact science; therefore, there is also the possibility of unforeseen or unpredictable risks and/or possible complications that may  result in a catastrophic outcome. The patient indicated having understood very clearly. We have given the patient no guarantees and we have made no promises. Enough time was given to the patient to ask questions, all of which were answered to the patient's satisfaction. Richard Cook has indicated that he wanted to continue with the procedure. Attestation: I, the ordering provider, attest that I have discussed with the patient the benefits, risks, side-effects, alternatives, likelihood of achieving goals, and potential problems during recovery for the procedure that I have provided informed consent. Date  Time: 06/15/2019 12:24 PM  Pre-Procedure Preparation:  Monitoring: As per clinic protocol. Respiration, ETCO2, SpO2, BP, heart rate and rhythm monitor placed and checked for adequate function Safety Precautions: Patient was assessed for positional comfort and pressure points before starting the procedure. Time-out: I initiated and conducted the "Time-out" before starting the procedure, as per protocol. The patient was asked to participate by confirming the accuracy of the "Time Out" information. Verification of the correct person, site, and procedure were performed and confirmed by me, the nursing staff, and the patient. "Time-out" conducted as per Joint Commission's Universal Protocol (UP.01.01.01). Time: 1240  Description of Procedure:          Target Area: The interlaminar space, initially targeting the lower laminar border of the superior vertebral body. Approach: Paramedial approach. Area Prepped: Entire Posterior Lumbar Region Prepping solution: DuraPrep (Iodine Povacrylex [0.7% available iodine] and Isopropyl Alcohol, 74% w/w) Safety Precautions: Aspiration looking for blood return was conducted prior to all injections. At no point did we inject any substances, as a needle was being advanced. No attempts were made at seeking any paresthesias. Safe injection practices and needle disposal  techniques used. Medications properly checked for expiration dates. SDV (single dose vial) medications used. Description of the Procedure: Protocol guidelines were followed. The procedure needle was introduced through the skin, ipsilateral to the reported pain, and advanced to the target area. Bone was contacted and the needle walked caudad, until the lamina was cleared. The epidural space was identified using "loss-of-resistance technique"  with 2-3 ml of PF-NaCl (0.9% NSS), in a 5cc LOR glass syringe.  Vitals:   06/15/19 1224 06/15/19 1239 06/15/19 1247  BP: (!) 143/94 (!) 144/98 (!) 140/99  Pulse: 78 72 77  Resp: 18 16 16   Temp: 98.1 F (36.7 C)    TempSrc: Temporal    SpO2: 100% 98% 98%  Weight: 185 lb (83.9 kg)    Height: 5\' 10"  (1.778 m)      Start Time: 1241 hrs. End Time: 1247 hrs.  Materials:  Needle(s) Type: Epidural needle Gauge: 17G Length: 3.5-in Medication(s): Please see orders for medications and dosing details.  Imaging Guidance (Spinal):          Type of Imaging Technique: Fluoroscopy Guidance (Spinal) Indication(s): Assistance in needle guidance and placement for procedures requiring needle placement in or near specific anatomical locations not easily accessible without such assistance. Exposure Time: Please see nurses notes. Contrast: Before injecting any contrast, we confirmed that the patient did not have an allergy to iodine, shellfish, or radiological contrast. Once satisfactory needle placement was completed at the desired level, radiological contrast was injected. Contrast injected under live fluoroscopy. No contrast complications. See chart for type and volume of contrast used. Fluoroscopic Guidance: I was personally present during the use of fluoroscopy. "Tunnel Vision Technique" used to obtain the best possible view of the target area. Parallax error corrected before commencing the procedure. "Direction-depth-direction" technique used to introduce the needle  under continuous pulsed fluoroscopy. Once target was reached, antero-posterior, oblique, and lateral fluoroscopic projection used confirm needle placement in all planes. Images permanently stored in EMR. Interpretation: I personally interpreted the imaging intraoperatively. Adequate needle placement confirmed in multiple planes. Appropriate spread of contrast into desired area was observed. No evidence of afferent or efferent intravascular uptake. No intrathecal or subarachnoid spread observed. Permanent images saved into the patient's record.  Antibiotic Prophylaxis:   Anti-infectives (From admission, onward)   None     Indication(s): None identified  Post-operative Assessment:  Post-procedure Vital Signs:  Pulse/HCG Rate: 77  Temp: 98.1 F (36.7 C) Resp: 16 BP: (!) 140/99 SpO2: 98 %  EBL: None  Complications: No immediate post-treatment complications observed by team, or reported by patient.  Note: The patient tolerated the entire procedure well. A repeat set of vitals were taken after the procedure and the patient was kept under observation following institutional policy, for this type of procedure. Post-procedural neurological assessment was performed, showing return to baseline, prior to discharge. The patient was provided with post-procedure discharge instructions, including a section on how to identify potential problems. Should any problems arise concerning this procedure, the patient was given instructions to immediately contact us, at any time, without hesitation. In any case, we plan to contact the patient by telephone for a follow-up status report regarding this interventional procedure.  Comments:  No additional relevant information.  Plan of Care  Orders:  Orders Placed This Encounter  Procedures  . LESI (Today)    Scheduling Instructions:     Procedure: Interlaminar LESI L4-5     Laterality: Right-sided     Sedation: Patient's choice     Timeframe:  Today    Order  Specific Question:   Where will this procedure be performed?    Answer:   [facility]  . Fluoro (C-Arm) (<60 min) (No Report)    Intraoperative interpretation by procedural physician at [facility].    Standing Status:   Standing    Number of Occurrences:   1  Order Specific Question:   Reason for exam:    Answer:   Assistance in needle guidance and placement for procedures requiring needle placement in or near specific anatomical locations not easily accessible without such assistance.  . Consent: LESI    Provider Attestation: I, Demarest Dossie Arbour, MD, (Pain Management Specialist), the physician/practitioner, attest that I have discussed with the patient the benefits, risks, side effects, alternatives, likelihood of achieving goals and potential problems during recovery for the procedure that I have provided informed consent.    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     Transcribe to consent form and obtain patient signature.  Marland Kitchen Epidural Tray    Equipment required: Single use, disposable, "Epidural Tray" Epidural Catheter: NOT required    Standing Status:   Standing    Number of Occurrences:   1    Order Specific Question:   Specify    Answer:   Epidural Tray   Chronic Opioid Analgesic:  No opioid analgesics prescribed by our practice.   Medications ordered for procedure: Meds ordered this encounter  Medications  . iohexol (OMNIPAQUE) 180 MG/ML injection 10 mL    Must be Myelogram-compatible. If not available, you may substitute with a water-soluble, non-ionic, hypoallergenic, myelogram-compatible radiological contrast medium.  Marland Kitchen lidocaine (XYLOCAINE) 2 % (with pres) injection 400 mg  . sodium chloride flush (NS) 0.9 % injection 2 mL   . ropivacaine (PF) 2 mg/mL (0.2%) (NAROPIN) injection 2 mL  . triamcinolone acetonide (KENALOG-40) injection 40 mg   Medications administered: We administered iohexol, lidocaine, sodium chloride flush, ropivacaine (PF) 2 mg/mL (0.2%), and triamcinolone acetonide.  See the medical record for exact dosing, route, and time of administration.  Follow-up plan:   Return in about 2 weeks (around 06/29/2019) for (VV), (PP).       Interventional Therapies:  Considering:   Palliative interventions    PRN Procedures:   Palliative/therapeutic right T11-12 TESI #2  Palliative right L4-5 LESI #2  Palliative left L4-5 LESI #2  Palliative TPI/MNB     Recent Visits Date Type Provider Dept  05/24/19 Office Visit Milinda Pointer, MD [facility]  Showing recent visits within past 90 days and meeting all other requirements   Today's Visits Date Type Provider Dept  06/15/19 Procedure visit Milinda Pointer, MD [facility]  Showing today's visits and meeting all other requirements   Future Appointments Date Type Provider Dept  07/03/19 Appointment Milinda Pointer, MD [facility]  Showing future appointments within next 90 days and meeting all other requirements   Disposition: Discharge home  Discharge Date & Time: 06/15/2019; 1253 hrs.   Primary Care Physician: Birdie Sons, MD Location: [facility] Note by: Gaspar Cola, MD Date: 06/15/2019; Time: 1:19 PM  Disclaimer:  Medicine is not an Chief Strategy Officer. The only guarantee in medicine is that nothing is guaranteed. It is important to note that the decision to proceed with this intervention was based on the information collected from the patient. The Data and conclusions were drawn from the patient's questionnaire, the interview, and the physical examination. Because the information was provided in large part by the patient, it cannot be guaranteed that it has not been  purposely or unconsciously manipulated. Every effort has been made  to obtain as much relevant data as possible for this evaluation. It is important to note that the conclusions that lead to this procedure are derived in large part from the available data. Always take into account that the treatment will also be dependent on availability of resources and existing treatment guidelines, considered by other Pain Management Practitioners as being common knowledge and practice, at the time of the intervention. For Medico-Legal purposes, it is also important to point out that variation in procedural techniques and pharmacological choices are the acceptable norm. The indications, contraindications, technique, and results of the above procedure should only be interpreted and judged by a Board-Certified Interventional Pain Specialist with extensive familiarity and expertise in the same exact procedure and technique.

## 2019-06-16 ENCOUNTER — Telehealth: Payer: Self-pay

## 2019-06-16 NOTE — Telephone Encounter (Signed)
Post procedure phone call.  Patient states he is a little sore today.  Instructed to use heat today and to call us for any questions or concerns.

## 2019-06-19 ENCOUNTER — Telehealth: Payer: Self-pay | Admitting: Pain Medicine

## 2019-06-19 ENCOUNTER — Telehealth: Payer: Self-pay | Admitting: Family Medicine

## 2019-06-19 ENCOUNTER — Other Ambulatory Visit: Payer: Self-pay | Admitting: Family Medicine

## 2019-06-19 DIAGNOSIS — E039 Hypothyroidism, unspecified: Secondary | ICD-10-CM

## 2019-06-19 DIAGNOSIS — M5489 Other dorsalgia: Secondary | ICD-10-CM

## 2019-06-19 MED ORDER — HYDROCODONE-ACETAMINOPHEN 10-325 MG PO TABS: 1.0000 | ORAL_TABLET | Freq: Three times a day (TID) | ORAL | 0 refills | Status: DC | PRN

## 2019-06-19 NOTE — Telephone Encounter (Signed)
Agrees to come for Toradol/Norflex injection.

## 2019-06-19 NOTE — Telephone Encounter (Signed)
Patient lvmail stating he had procedure on Thurs. On Friday we was in [place] parking lot trying to help someone unlock their car and upper back locked up. He is in quite a lot of pain. Is there anything can be done?

## 2019-06-19 NOTE — Telephone Encounter (Unsigned)
Copied from [place] [id number]. Topic: Quick Communication - Rx Refill/Question >> Jun 19, 2019 10:42 AM Yvette Rack wrote: Medication: HYDROcodone-acetaminophen (NORCO) 10-325 MG tablet  Has the patient contacted their pharmacy? no  Preferred Pharmacy (with phone number or street name): [id number] [place], [place] [phone] (Phone)  [phone] (Fax)  Agent: Please be advised that RX refills may take up to 3 business days. We ask that you follow-up with your pharmacy.

## 2019-06-19 NOTE — Telephone Encounter (Signed)
I send a bubble message to Dr. Dossie Arbour asking about Toradol/Norflex injection.

## 2019-06-19 NOTE — Telephone Encounter (Signed)
Please advise patient it is time to check thyroid functions since starting levothyroxine last month. Please print order and leave at lab.

## 2019-06-19 NOTE — Telephone Encounter (Signed)
Patient advised. Lab slipped printed at front desk in [address].

## 2019-06-20 ENCOUNTER — Ambulatory Visit: Payer: PPO | Admitting: Pain Medicine

## 2019-06-23 NOTE — Progress Notes (Signed)
Remote ICD transmission.   

## 2019-06-27 ENCOUNTER — Ambulatory Visit: Payer: PPO | Admitting: Pain Medicine

## 2019-07-03 ENCOUNTER — Ambulatory Visit: Payer: PPO | Admitting: Pain Medicine

## 2019-07-03 ENCOUNTER — Telehealth: Payer: Self-pay

## 2019-07-03 ENCOUNTER — Other Ambulatory Visit: Payer: Self-pay

## 2019-07-03 NOTE — Telephone Encounter (Signed)
Attempted to call patient to  Get med recon and post procedural information. No answer, immediately when to voicemail on first ring. Left message for patient to call us back today and if unable to keep appt to call and reschedule. I did also instructed patient that Dr Dossie Arbour will be calling this number today and he should answer the phone.Marland Kitchen

## 2019-07-03 NOTE — Progress Notes (Deleted)
Cancelled appointment

## 2019-07-05 ENCOUNTER — Telehealth: Payer: Self-pay | Admitting: Internal Medicine

## 2019-07-05 NOTE — Telephone Encounter (Signed)
Spoke with the patient at length regarding his current condition. He is concerned that his HR was up to 120 bpm. Pt is currently hiking on the [employer]. He just completed a 3 mile climb and the temperature is 9 degrees. Pt states his HR has never been this high and he began to feel dizzy so he sat down to rest for about an hour. His HR is now 95. He also missed his Metoprolol this morning.   I advised this pt that a HR of 120 while exercising is appropriate and he more than likely has not seen this high of  HR when exercising in the past d/t his Metoprolol lowering his underlying HR. Pt wants to know a specific HR in which he needs to stop hiking? I advised pt to listen to his body and if he is symptomatic to stop and take breaks, stay hydrated, continue monitoring vitals when he is symptomatic. Pt assures me he is not hiking alone and has been watching his BP and HR throughout his hike.   Will route to [place] for a specific parameter for HR while exercising.

## 2019-07-05 NOTE — Telephone Encounter (Signed)
New Message     STAT if HR is under 50 or over 120 (normal HR is 60-100 beats per minute)  1) What is your heart rate? Was 120 about 1 hr ago and now it is 95   2) Do you have a log of your heart rate readings (document readings)? Yes   3) Do you have any other symptoms? Pt is back packing on trails and he said he felt dizzy and his legs were wobbly. His HR was 120 and BP 132/80 and he has been sitting, resting on a rock  For about an hour and it is now 95. Pt feels concerned and worried

## 2019-07-05 NOTE — Telephone Encounter (Signed)
For him it seems like 120 is too high and 9 degrees is too cold :) Lets plan to target 105 thx sk

## 2019-07-07 NOTE — Telephone Encounter (Signed)
LMTCB regarding pts tager HR per Dr. [name] it is 105 bpm-see previous phone note

## 2019-07-10 DIAGNOSIS — I129 Hypertensive chronic kidney disease with stage 1 through stage 4 chronic kidney disease, or unspecified chronic kidney disease: Secondary | ICD-10-CM | POA: Diagnosis not present

## 2019-07-10 DIAGNOSIS — E559 Vitamin D deficiency, unspecified: Secondary | ICD-10-CM | POA: Diagnosis not present

## 2019-07-10 DIAGNOSIS — N184 Chronic kidney disease, stage 4 (severe): Secondary | ICD-10-CM | POA: Diagnosis not present

## 2019-07-10 NOTE — Telephone Encounter (Signed)
Pt aware of recommendations and pt wanted to let Dr [name] know that he actually had forgotten to take his Metoprolol that am and stated once had taken med within 30 mins HR was in the 70's .

## 2019-07-13 ENCOUNTER — Other Ambulatory Visit: Payer: Self-pay | Admitting: Student

## 2019-07-13 NOTE — Telephone Encounter (Signed)
Pt's pharmacy is requesting a refill on metoprolol. This medication was D/C. Does pt suppose to still be taking this medication? Please address

## 2019-07-17 ENCOUNTER — Telehealth: Payer: Self-pay | Admitting: Family Medicine

## 2019-07-17 DIAGNOSIS — N184 Chronic kidney disease, stage 4 (severe): Secondary | ICD-10-CM | POA: Diagnosis not present

## 2019-07-17 DIAGNOSIS — I129 Hypertensive chronic kidney disease with stage 1 through stage 4 chronic kidney disease, or unspecified chronic kidney disease: Secondary | ICD-10-CM | POA: Diagnosis not present

## 2019-07-17 DIAGNOSIS — E039 Hypothyroidism, unspecified: Secondary | ICD-10-CM | POA: Diagnosis not present

## 2019-07-17 DIAGNOSIS — N2581 Secondary hyperparathyroidism of renal origin: Secondary | ICD-10-CM | POA: Diagnosis not present

## 2019-07-17 DIAGNOSIS — R110 Nausea: Secondary | ICD-10-CM

## 2019-07-17 MED ORDER — PROMETHAZINE HCL 25 MG PO TABS: 25.0000 mg | ORAL_TABLET | Freq: Three times a day (TID) | ORAL | 0 refills | Status: DC | PRN

## 2019-07-17 NOTE — Telephone Encounter (Signed)
Please advise. It looks like patient was prescribed Phenergan 25mg  back on 12/16/2018 for nausea. Patient is requesting refill on nausea medication.

## 2019-07-17 NOTE — Telephone Encounter (Signed)
Patient walked in requesting refill on the "nausea" medicine you gave him "6 mths" ago.  [facility] [address]

## 2019-07-17 NOTE — Telephone Encounter (Signed)
Noted  

## 2019-07-18 ENCOUNTER — Other Ambulatory Visit: Payer: Self-pay | Admitting: Family Medicine

## 2019-07-18 ENCOUNTER — Telehealth: Payer: Self-pay

## 2019-07-18 DIAGNOSIS — M5489 Other dorsalgia: Secondary | ICD-10-CM

## 2019-07-18 LAB — T4, FREE: Free T4: 1.01 ng/dL (ref 0.82–1.77)

## 2019-07-18 LAB — TSH: TSH: 5.85 u[IU]/mL — ABNORMAL HIGH (ref 0.450–4.500)

## 2019-07-18 MED ORDER — HYDROCODONE-ACETAMINOPHEN 10-325 MG PO TABS: 1.0000 | ORAL_TABLET | Freq: Three times a day (TID) | ORAL | 0 refills | Status: DC | PRN

## 2019-07-18 NOTE — Telephone Encounter (Signed)
Medication Refill - Medication: HYDROcodone-acetaminophen (NORCO) 10-325 MG tablet   Has the patient contacted their pharmacy? Yes.   (Agent: If no, request that the patient contact the pharmacy for the refill.) (Agent: If yes, when and what did the pharmacy advise?)  Preferred Pharmacy (with phone number or street name):  [id number] [place], [place] - [address]  [address] [place] [address]  Phone: [phone] Fax: [phone]     Agent: Please be advised that RX refills may take up to 3 business days. We ask that you follow-up with your pharmacy.  

## 2019-07-18 NOTE — Telephone Encounter (Signed)
-----   Message from Birdie Sons, MD sent at 07/18/2019  8:03 AM EST ----- Still slightly hypothyroid, but much better. Need to stay on 40mcg levothyroxine for now and recheck thyroid functions in 3-4 months.

## 2019-07-18 NOTE — Telephone Encounter (Signed)
Requested medication (s) are due for refill today: yes  Requested medication (s) are on the active medication list: yes  Last refill:  06/19/2019  Future visit scheduled: no  Notes to clinic:  refill cannot be delegated    Requested Prescriptions  Pending Prescriptions Disp Refills   HYDROcodone-acetaminophen (NORCO) 10-325 MG tablet 60 tablet 0    Sig: Take 1 tablet by mouth every 8 (eight) hours as needed.      Not Delegated - Analgesics:  Opioid Agonist Combinations Failed - 07/18/2019  1:12 PM      Failed - This refill cannot be delegated      Failed - Urine Drug Screen completed in last 360 days.      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

## 2019-07-18 NOTE — Telephone Encounter (Signed)
PEC 

## 2019-07-18 NOTE — Telephone Encounter (Signed)
Pt given lab results per notes of Dr Caryn Section on 07/18/19. Pt verbalized understanding. Advised pt to come in March or April of 2021 for lab draw. Please enter order for future bloodwrok

## 2019-07-18 NOTE — Telephone Encounter (Signed)
Tried calling patient. Left message to call back. Okay for [facility] triage nurse to advise patient of message.

## 2019-07-19 ENCOUNTER — Encounter: Payer: Self-pay | Admitting: Adult Health

## 2019-07-19 ENCOUNTER — Ambulatory Visit (INDEPENDENT_AMBULATORY_CARE_PROVIDER_SITE_OTHER): Payer: PPO | Admitting: Adult Health

## 2019-07-19 ENCOUNTER — Other Ambulatory Visit: Payer: Self-pay

## 2019-07-19 DIAGNOSIS — S0031XA Abrasion of nose, initial encounter: Secondary | ICD-10-CM

## 2019-07-19 DIAGNOSIS — J019 Acute sinusitis, unspecified: Secondary | ICD-10-CM

## 2019-07-19 DIAGNOSIS — N185 Chronic kidney disease, stage 5: Secondary | ICD-10-CM | POA: Diagnosis not present

## 2019-07-19 DIAGNOSIS — Z20822 Contact with and (suspected) exposure to covid-19: Secondary | ICD-10-CM

## 2019-07-19 DIAGNOSIS — Z20828 Contact with and (suspected) exposure to other viral communicable diseases: Secondary | ICD-10-CM

## 2019-07-19 MED ORDER — MUPIROCIN 2 % EX OINT: 1.0000 "application " | TOPICAL_OINTMENT | Freq: Two times a day (BID) | CUTANEOUS | 0 refills | Status: DC

## 2019-07-19 MED ORDER — DOXYCYCLINE HYCLATE 100 MG PO TABS: 100.0000 mg | ORAL_TABLET | Freq: Two times a day (BID) | ORAL | 0 refills | Status: DC

## 2019-07-19 MED ORDER — PREDNISONE 10 MG (21) PO TBPK: ORAL_TABLET | ORAL | 0 refills | Status: DC

## 2019-07-19 NOTE — Progress Notes (Addendum)
Patient: Richard Cook Male    DOB: July 03, 1956   63 y.o.   MRN: [id number] Visit Date: 07/19/2019  Today's Provider: Marcille Buffy, FNP   Chief Complaint  Patient presents with  . Sinus Problem   Subjective:    Virtual Visit via telephone   I connected with Richard Cook on 07/19/19 at  2:20 PM EST by a video enabled telemedicine application and verified that I am speaking with the correct person using two identifiers.  Location: Patient: at home  Provider: Provider: Provider's office at  [facility], [place].     I discussed the limitations of evaluation and management by telemedicine and the availability of in person appointments. The patient expressed understanding and agreed to proceed.   I discussed the assessment and treatment plan with the patient. The patient was provided an opportunity to ask questions and all were answered. The patient agreed with the plan and demonstrated an understanding of the instructions.   The patient was advised to call back or seek an in-person evaluation if the symptoms worsen or if the condition fails to improve as anticipated.  I provided 15 minutes of non-face-to-face time during this encounter.   Sinus Problem This is a new problem. The current episode started 1 to 4 weeks ago. The problem is unchanged. There has been no fever. Associated symptoms include sinus pressure and sneezing. Pertinent negatives include no chills, congestion, coughing, diaphoresis, ear pain, headaches, hoarse voice, neck pain, shortness of breath, sore throat or swollen glands. Treatments tried: Benadryl. The treatment provided no relief.   Onset 3 weeks ago. Left sided nasal area feels sore. Has had mild bleeding from this spot inside his nose. He feels he could have scratched it inside.  He has sinus pressure in near his nose and on bilateral cheeks.  He has been cold in the mornings the past few days. He saw  nephrology yesterday for follow up. He has noCough.  Denies any sore throat or any difficulty swallowing.  Denies fever. Does report he has been cold the past few mornings upon wakening and it takes him a few hours to warm up. Denies any fever.   He has been having constipation ongoing  Thought to be related to hypothyroidism.TSH improving. Denies any allergies. He is improving. Denies any new or changing symptoms. He has nausea that has been chronic. Denies vomiting. Denies any abdominal pain. Urinating normally.   Patient  denies any fever, body aches, rash, chest pain, shortness of breath, vomiting, or diarrhea.   Denies any exposure to Covid.    Allergies  Allergen Reactions  . Codeine Other (See Comments)    Tolerates Hydrocodone (??)  . Colchicine Nausea And Vomiting     Current Outpatient Medications:  .  allopurinol (ZYLOPRIM) 300 MG tablet, TAKE 1 TABLET BY MOUTH EVERY DAY, Disp: 90 tablet, Rfl: 4 .  amiodarone (PACERONE) 200 MG tablet, Take 1.5 tablets (300 mg total) by mouth daily. You may take this in the morning, Disp: 125 tablet, Rfl: 3 .  aspirin EC 81 MG tablet, Take 81 mg by mouth daily., Disp: , Rfl:  .  BEE POLLEN PO, Take 1 capsule by mouth daily. , Disp: , Rfl:  .  clonazePAM (KLONOPIN) 1 MG tablet, TAKE ONE TABLET EVERY AFTERNOON AND 1 & 1/2 TABLET AT BEDTIME, Disp: 75 tablet, Rfl: 2 .  furosemide (LASIX) 40 MG tablet, TAKE 1 TABLET (40 MG TOTAL) BY MOUTH DAILY  AS NEEDED FOR FLUID., Disp: 90 tablet, Rfl: 4 .  HYDROcodone-acetaminophen (NORCO) 10-325 MG tablet, Take 1 tablet by mouth every 8 (eight) hours as needed., Disp: 60 tablet, Rfl: 0 .  levothyroxine (SYNTHROID) 25 MCG tablet, Take 1 tablet (25 mcg total) by mouth daily before breakfast., Disp: 90 tablet, Rfl: 3 .  linaclotide (LINZESS) 72 MCG capsule, Take 1 capsule (72 mcg total) by mouth daily before breakfast., Disp: 30 capsule, Rfl: 3 .  mexiletine (MEXITIL) 250 MG capsule, Take 1 capsule (250 mg total)  by mouth 3 (three) times daily., Disp: 180 capsule, Rfl: 3 .  Omega-3 Fatty Acids (FISH OIL PO), Take 1 capsule by mouth daily., Disp: , Rfl:   Review of Systems  Constitutional: Negative for chills and diaphoresis.  HENT: Positive for sinus pressure and sneezing. Negative for congestion, ear pain, hoarse voice and sore throat.   Respiratory: Negative for cough and shortness of breath.   Musculoskeletal: Negative for neck pain.  Neurological: Negative for headaches.    Social History   Tobacco Use  . Smoking status: Never Smoker  . Smokeless tobacco: Never Used  Substance Use Topics  . Alcohol use: No    Comment: 10/07/2016 "quit in the early 1990s"      Objective:   There were no vitals taken for this visit. There were no vitals filed for this visit.There is no height or weight on file to calculate BMI.   Physical Exam   Patient is alert and oriented and responsive to questions Engages in conversation with provider. Speaks in full sentences without any pauses without any shortness of breath or distress.   No results found for any visits on 07/19/19.     Assessment & Plan     1. Acute non-recurrent sinusitis, unspecified location Meds ordered this encounter  Medications  . doxycycline (VIBRA-TABS) 100 MG tablet    Sig: Take 1 tablet (100 mg total) by mouth 2 (two) times daily.    Dispense:  20 tablet    Refill:  0  . mupirocin ointment (BACTROBAN) 2 %    Sig: Apply 1 application topically 2 (two) times daily. Right nare apply small amount.    Dispense:  22 g    Refill:  0  . predniSONE (STERAPRED UNI-PAK 21 TAB) 10 MG (21) TBPK tablet    Sig: PO: Take 6 tablets on day 1:Take 5 tablets day 2:Take 4 tablets day 3: Take 3 tablets day 4:Take 2 tablets day five: 5 Take 1 tablet day 6    Dispense:  21 tablet    Refill:  0    2. Nasal abrasion, initial encounter Bactroban as above. If persists longer than two weeks follow up.   3. Chronic kidney disease with active  medical management without dialysis, stage 5 (HCC) Avoid NSAIDS. Report any new symptoms.   4. Suspected Covid- recomnend testing at [facility] information to schedule appointment given.   Advised patient call the office or your primary care doctor for an appointment if no improvement within 72 hours or if any symptoms change or worsen at any time  Advised ER or urgent Care if after hours or on weekend. Call 911 for emergency symptoms at any time.Patinet verbalized understanding of all instructions given/reviewed and treatment plan and has no further questions or concerns at this time.       The entirety of the information documented in the History of Present Illness, Review of Systems and Physical Exam were personally obtained by me. Portions  of this information were initially documented by the  Certified Medical Assistant whose name is documented in [place] and reviewed by me for thoroughness and accuracy.  I have personally performed the exam and reviewed the chart and it is accurate to the best of my knowledge.  Haematologist has been used and any errors in dictation or transcription are unintentional.  Kelby Aline. [place], [facility]

## 2019-07-19 NOTE — Patient Instructions (Addendum)
Sinusitis, Adult Sinusitis is soreness and swelling (inflammation) of your sinuses. Sinuses are hollow spaces in the bones around your face. They are located:  Around your eyes.  In the middle of your forehead.  Behind your nose.  In your cheekbones. Your sinuses and nasal passages are lined with a fluid called mucus. Mucus drains out of your sinuses. Swelling can trap mucus in your sinuses. This lets germs (bacteria, virus, or fungus) grow, which leads to infection. Most of the time, this condition is caused by a virus. What are the causes? This condition is caused by:  Allergies.  Asthma.  Germs.  Things that block your nose or sinuses.  Growths in the nose (nasal polyps).  Chemicals or irritants in the air.  Fungus (rare). What increases the risk? You are more likely to develop this condition if:  You have a weak body defense system (immune system).  You do a lot of swimming or diving.  You use nasal sprays too much.  You smoke. What are the signs or symptoms? The main symptoms of this condition are pain and a feeling of pressure around the sinuses. Other symptoms include:  Stuffy nose (congestion).  Runny nose (drainage).  Swelling and warmth in the sinuses.  Headache.  Toothache.  A cough that may get worse at night.  Mucus that collects in the throat or the back of the nose (postnasal drip).  Being unable to smell and taste.  Being very tired (fatigue).  A fever.  Sore throat.  Bad breath. How is this diagnosed? This condition is diagnosed based on:  Your symptoms.  Your medical history.  A physical exam.  Tests to find out if your condition is short-term (acute) or long-term (chronic). Your doctor may: ? Check your nose for growths (polyps). ? Check your sinuses using a tool that has a light (endoscope). ? Check for allergies or germs. ? Do imaging tests, such as an MRI or CT scan. How is this treated? Treatment for this condition  depends on the cause and whether it is short-term or long-term.  If caused by a virus, your symptoms should go away on their own within 10 days. You may be given medicines to relieve symptoms. They include: ? Medicines that shrink swollen tissue in the nose. ? Medicines that treat allergies (antihistamines). ? A spray that treats swelling of the nostrils. ? Rinses that help get rid of thick mucus in your nose (nasal saline washes).  If caused by bacteria, your doctor may wait to see if you will get better without treatment. You may be given antibiotic medicine if you have: ? A very bad infection. ? A weak body defense system.  If caused by growths in the nose, you may need to have surgery. Follow these instructions at home: Medicines  Take, use, or apply over-the-counter and prescription medicines only as told by your doctor. These may include nasal sprays.  If you were prescribed an antibiotic medicine, take it as told by your doctor. Do not stop taking the antibiotic even if you start to feel better. Hydrate and humidify   Drink enough water to keep your pee (urine) pale yellow.  Use a cool mist humidifier to keep the humidity level in your home above 50%.  Breathe in steam for 10-15 minutes, 3-4 times a day, or as told by your doctor. You can do this in the bathroom while a hot shower is running.  Try not to spend time in cool or dry air.   Rest  Rest as much as you can.  Sleep with your head raised (elevated).  Make sure you get enough sleep each night. General instructions   Put a warm, moist washcloth on your face 3-4 times a day, or as often as told by your doctor. This will help with discomfort.  Wash your hands often with soap and water. If there is no soap and water, use hand sanitizer.  Do not smoke. Avoid being around people who are smoking (secondhand smoke).  Keep all follow-up visits as told by your doctor. This is important. Contact a doctor if:  You  have a fever.  Your symptoms get worse.  Your symptoms do not get better within 10 days. Get help right away if:  You have a very bad headache.  You cannot stop throwing up (vomiting).  You have very bad pain or swelling around your face or eyes.  You have trouble seeing.  You feel confused.  Your neck is stiff.  You have trouble breathing. Summary  Sinusitis is swelling of your sinuses. Sinuses are hollow spaces in the bones around your face.  This condition is caused by tissues in your nose that become inflamed or swollen. This traps germs. These can lead to infection.  If you were prescribed an antibiotic medicine, take it as told by your doctor. Do not stop taking it even if you start to feel better.  Keep all follow-up visits as told by your doctor. This is important. This information is not intended to replace advice given to you by your health care provider. Make sure you discuss any questions you have with your health care provider. Document Released: 01/06/2008 Document Revised: 12/20/2017 Document Reviewed: 12/20/2017 Elsevier Patient Education  [place]. Doxycycline tablets or capsules What is this medicine? DOXYCYCLINE (dox i SYE kleen) is a tetracycline antibiotic. It kills certain bacteria or stops their growth. It is used to treat many kinds of infections, like dental, skin, respiratory, and urinary tract infections. It also treats acne, Lyme disease, malaria, and certain sexually transmitted infections. This medicine may be used for other purposes; ask your health care provider or pharmacist if you have questions. COMMON BRAND NAME(S): Acticlate, Adoxa, Adoxa CK, Adoxa Pak, Adoxa TT, Alodox, Avidoxy, Doxal, LYMEPAK, Mondoxyne NL, Monodox, Morgidox 1x, Morgidox 1x Kit, Morgidox 2x, Morgidox 2x Kit, NutriDox, Ocudox, TARGADOX, Vibra-Tabs, Vibramycin What should I tell my health care provider before I take this medicine? They need to know if you have any  of these conditions:  liver disease  long exposure to sunlight like working outdoors  stomach problems like colitis  an unusual or allergic reaction to doxycycline, tetracycline antibiotics, other medicines, foods, dyes, or preservatives  pregnant or trying to get pregnant  breast-feeding How should I use this medicine? Take this medicine by mouth with a full glass of water. Follow the directions on the prescription label. It is best to take this medicine without food, but if it upsets your stomach take it with food. Take your medicine at regular intervals. Do not take your medicine more often than directed. Take all of your medicine as directed even if you think you are better. Do not skip doses or stop your medicine early. Talk to your pediatrician regarding the use of this medicine in children. While this drug may be prescribed for selected conditions, precautions do apply. Overdosage: If you think you have taken too much of this medicine contact a poison control center or emergency room at once. NOTE:  This medicine is only for you. Do not share this medicine with others. What if I miss a dose? If you miss a dose, take it as soon as you can. If it is almost time for your next dose, take only that dose. Do not take double or extra doses. What may interact with this medicine?  antacids  barbiturates  birth control pills  bismuth subsalicylate  carbamazepine  methoxyflurane  other antibiotics  phenytoin  vitamins that contain iron  warfarin This list may not describe all possible interactions. Give your health care provider a list of all the medicines, herbs, non-prescription drugs, or dietary supplements you use. Also tell them if you smoke, drink alcohol, or use illegal drugs. Some items may interact with your medicine. What should I watch for while using this medicine? Tell your doctor or health care professional if your symptoms do not improve. Do not treat diarrhea  with over the counter products. Contact your doctor if you have diarrhea that lasts more than 2 days or if it is severe and watery. Do not take this medicine just before going to bed. It may not dissolve properly when you lay down and can cause pain in your throat. Drink plenty of fluids while taking this medicine to also help reduce irritation in your throat. This medicine can make you more sensitive to the sun. Keep out of the sun. If you cannot avoid being in the sun, wear protective clothing and use sunscreen. Do not use sun lamps or tanning beds/booths. Birth control pills may not work properly while you are taking this medicine. Talk to your doctor about using an extra method of birth control. If you are being treated for a sexually transmitted infection, avoid sexual contact until you have finished your treatment. Your sexual partner may also need treatment. Avoid antacids, aluminum, calcium, magnesium, and iron products for 4 hours before and 2 hours after taking a dose of this medicine. If you are using this medicine to prevent malaria, you should still protect yourself from contact with mosquitos. Stay in screened-in areas, use mosquito nets, keep your body covered, and use an insect repellent. What side effects may I notice from receiving this medicine? Side effects that you should report to your doctor or health care professional as soon as possible:  allergic reactions like skin rash, itching or hives, swelling of the face, lips, or tongue  difficulty breathing  fever  itching in the rectal or genital area  pain on swallowing  rash, fever, and swollen lymph nodes  redness, blistering, peeling or loosening of the skin, including inside the mouth  severe stomach pain or cramps  unusual bleeding or bruising  unusually weak or tired  yellowing of the eyes or skin Side effects that usually do not require medical attention (report to your doctor or health care professional if  they continue or are bothersome):  diarrhea  loss of appetite  nausea, vomiting This list may not describe all possible side effects. Call your doctor for medical advice about side effects. You may report side effects to FDA at 1-800-FDA-1088. Where should I keep my medicine? Keep out of the reach of children. Store at room temperature, below 30 degrees C (86 degrees F). Protect from light. Keep container tightly closed. Throw away any unused medicine after the expiration date. Taking this medicine after the expiration date can make you seriously ill. NOTE: This sheet is a summary. It may not cover all possible information. If you have questions  about this medicine, talk to your doctor, pharmacist, or health care provider.  2020 Elsevier/Gold Standard (2018-10-20 13:44:53) COVID-19 Frequently Asked Questions COVID-19 (coronavirus disease) is an infection that is caused by a large family of viruses. Some viruses cause illness in people and others cause illness in animals like camels, cats, and bats. In some cases, the viruses that cause illness in animals can spread to humans. Where did the coronavirus come from? In December 2019, [place] told the [facility]) of several cases of lung disease (human respiratory illness). These cases were linked to an open seafood and livestock market in the city of [place]. The link to the seafood and livestock market suggests that the virus may have spread from animals to humans. However, since that first outbreak in December, the virus has also been shown to spread from person to person. What is the name of the disease and the virus? Disease name Early on, this disease was called novel coronavirus. This is because scientists determined that the disease was caused by a new (novel) respiratory virus. The World Health Organization [facility]) has now named the disease COVID-19, or coronavirus disease. Virus name The virus that causes the disease is  called severe acute respiratory syndrome coronavirus 2 (SARS-CoV-2). More information on disease and virus naming World Health Organization [facility]): www.who.int/emergencies/diseases/novel-coronavirus-2019/technical-guidance/naming-the-coronavirus-disease-(covid-2019)-and-the-virus-that-causes-it Who is at risk for complications from coronavirus disease? Some people may be at higher risk for complications from coronavirus disease. This includes older adults and people who have chronic diseases, such as heart disease, diabetes, and lung disease. If you are at higher risk for complications, take these extra precautions:  Avoid close contact with people who are sick or have a fever or cough. Stay at least 3-6 ft (1-2 m) away from them, if possible.  Wash your hands often with soap and water for at least 20 seconds.  Avoid touching your face, mouth, nose, or eyes.  Keep supplies on hand at home, such as food, medicine, and cleaning supplies.  Stay home as much as possible.  Avoid social gatherings and travel. How does coronavirus disease spread? The virus that causes coronavirus disease spreads easily from person to person (is contagious). There are also cases of community-spread disease. This means the disease has spread to:  People who have no known contact with other infected people.  People who have not traveled to areas where there are known cases. It appears to spread from one person to another through droplets from coughing or sneezing. Can I get the virus from touching surfaces or objects? There is still a lot that we do not know about the virus that causes coronavirus disease. Scientists are basing a lot of information on what they know about similar viruses, such as:  Viruses cannot generally survive on surfaces for long. They need a human body (host) to survive.  It is more likely that the virus is spread by close contact with people who are sick (direct contact), such as  through: ? Shaking hands or hugging. ? Breathing in respiratory droplets that travel through the air. This can happen when an infected person coughs or sneezes on or near other people.  It is less likely that the virus is spread when a person touches a surface or object that has the virus on it (indirect contact). The virus may be able to enter the body if the person touches a surface or object and then touches his or her face, eyes, nose, or mouth. Can a person spread the  virus without having symptoms of the disease? It may be possible for the virus to spread before a person has symptoms of the disease, but this is most likely not the main way the virus is spreading. It is more likely for the virus to spread by being in close contact with people who are sick and breathing in the respiratory droplets of a sick person's cough or sneeze. What are the symptoms of coronavirus disease? Symptoms vary from person to person and can range from mild to severe. Symptoms may include:  Fever.  Cough.  Tiredness, weakness, or fatigue.  Fast breathing or feeling short of breath. These symptoms can appear anywhere from 2 to 14 days after you have been exposed to the virus. If you develop symptoms, call your health care provider. People with severe symptoms may need hospital care. If I am exposed to the virus, how long does it take before symptoms start? Symptoms of coronavirus disease may appear anywhere from 2 to 14 days after a person has been exposed to the virus. If you develop symptoms, call your health care provider. Should I be tested for this virus? Your health care provider will decide whether to test you based on your symptoms, history of exposure, and your risk factors. How does a health care provider test for this virus? Health care providers will collect samples to send for testing. Samples may include:  Taking a swab of fluid from the nose.  Taking fluid from the lungs by having you cough up  mucus (sputum) into a sterile cup.  Taking a blood sample.  Taking a stool or urine sample. Is there a treatment or vaccine for this virus? Currently, there is no vaccine to prevent coronavirus disease. Also, there are no medicines like antibiotics or antivirals to treat the virus. A person who becomes sick is given supportive care, which means rest and fluids. A person may also relieve his or her symptoms by using over-the-counter medicines that treat sneezing, coughing, and runny nose. These are the same medicines that a person takes for the common cold. If you develop symptoms, call your health care provider. People with severe symptoms may need hospital care. What can I do to protect myself and my family from this virus?     You can protect yourself and your family by taking the same actions that you would take to prevent the spread of other viruses. Take the following actions:  Wash your hands often with soap and water for at least 20 seconds. If soap and water are not available, use alcohol-based hand sanitizer.  Avoid touching your face, mouth, nose, or eyes.  Cough or sneeze into a tissue, sleeve, or elbow. Do not cough or sneeze into your hand or the air. ? If you cough or sneeze into a tissue, throw it away immediately and wash your hands.  Disinfect objects and surfaces that you frequently touch every day.  Avoid close contact with people who are sick or have a fever or cough. Stay at least 3-6 ft (1-2 m) away from them, if possible.  Stay home if you are sick, except to get medical care. Call your health care provider before you get medical care.  Make sure your vaccines are up to date. Ask your health care provider what vaccines you need. What should I do if I need to travel? Follow travel recommendations from your local health authority, the CDC, and WHO. Travel information and advice  Centers for Disease Control and Prevention (  CDC):  BodyEditor.hu  World Health Organization [facility]): ThirdIncome.ca Know the risks and take action to protect your health  You are at higher risk of getting coronavirus disease if you are traveling to areas with an outbreak or if you are exposed to travelers from areas with an outbreak.  Wash your hands often and practice good hygiene to lower the risk of catching or spreading the virus. What should I do if I am sick? General instructions to stop the spread of infection  Wash your hands often with soap and water for at least 20 seconds. If soap and water are not available, use alcohol-based hand sanitizer.  Cough or sneeze into a tissue, sleeve, or elbow. Do not cough or sneeze into your hand or the air.  If you cough or sneeze into a tissue, throw it away immediately and wash your hands.  Stay home unless you must get medical care. Call your health care provider or local health authority before you get medical care.  Avoid public areas. Do not take public transportation, if possible.  If you can, wear a mask if you must go out of the house or if you are in close contact with someone who is not sick. Keep your home clean  Disinfect objects and surfaces that are frequently touched every day. This may include: ? Counters and tables. ? Doorknobs and light switches. ? Sinks and faucets. ? Electronics such as phones, remote controls, keyboards, computers, and tablets.  Wash dishes in hot, soapy water or use a dishwasher. Air-dry your dishes.  Wash laundry in hot water. Prevent infecting other household members  Let healthy household members care for children and pets, if possible. If you have to care for children or pets, wash your hands often and wear a mask.  Sleep in a different bedroom or bed, if possible.  Do not share personal items, such as razors, toothbrushes, deodorant, combs,  brushes, towels, and washcloths. Where to find more information Centers for Disease Control and Prevention (CDC)  Information and news updates: https://www.butler-gonzalez.com/ World Health Organization [facility])  Information and news updates: MissExecutive.com.ee  Coronavirus health topic: https://www.castaneda.info/  Questions and answers on COVID-19: OpportunityDebt.at  Global tracker: who.sprinklr.com American Academy of Pediatrics (AAP)  Information for families: www.healthychildren.org/English/health-issues/conditions/chest-lungs/Pages/2019-Novel-Coronavirus.aspx The coronavirus situation is changing rapidly. Check your local health authority website or the CDC and [facility] websites for updates and news. When should I contact a health care provider?  Contact your health care provider if you have symptoms of an infection, such as fever or cough, and you: ? Have been near anyone who is known to have coronavirus disease. ? Have come into contact with a person who is suspected to have coronavirus disease. ? Have traveled outside of the country. When should I get emergency medical care?  Get help right away by calling your local emergency services (911 in the [place] if you have: ? Trouble breathing. ? Pain or pressure in your chest. ? Confusion. ? Blue-tinged lips and fingernails. ? Difficulty waking from sleep. ? Symptoms that get worse. Let the emergency medical personnel know if you think you have coronavirus disease. Summary  A new respiratory virus is spreading from person to person and causing COVID-19 (coronavirus disease).  The virus that causes COVID-19 appears to spread easily. It spreads from one person to another through droplets from coughing or sneezing.  Older adults and those with chronic diseases are at higher risk of disease. If you are at higher risk for complications, take extra  precautions.  There is currently no vaccine to prevent coronavirus disease. There are no medicines, such as antibiotics or antivirals, to treat the virus.  You can protect yourself and your family by washing your hands often, avoiding touching your face, and covering your coughs and sneezes. This information is not intended to replace advice given to you by your health care provider. Make sure you discuss any questions you have with your health care provider. Document Released: 11/15/2018 Document Revised: 11/15/2018 Document Reviewed: 11/15/2018 Elsevier Patient Education  [place].

## 2019-08-09 ENCOUNTER — Other Ambulatory Visit: Payer: Self-pay | Admitting: Student

## 2019-08-09 NOTE — Telephone Encounter (Signed)
Patient requesting refill for Metoprolol, discontinued in Oct 2020. Is it ok to refill? Please advise.

## 2019-08-21 ENCOUNTER — Other Ambulatory Visit: Payer: Self-pay | Admitting: Family Medicine

## 2019-08-21 DIAGNOSIS — M5489 Other dorsalgia: Secondary | ICD-10-CM

## 2019-08-21 NOTE — Telephone Encounter (Signed)
Requested medication (s) are due for refill today: yes  Requested medication (s) are on the active medication list: yes  Last refill:07/18/2019  Future visit scheduled: no  Notes to clinic:  Not delegated    Requested Prescriptions  Pending Prescriptions Disp Refills   HYDROcodone-acetaminophen (NORCO) 10-325 MG tablet 60 tablet 0    Sig: Take 1 tablet by mouth every 8 (eight) hours as needed.      Not Delegated - Analgesics:  Opioid Agonist Combinations Failed - 08/21/2019  6:04 PM      Failed - This refill cannot be delegated      Failed - Urine Drug Screen completed in last 360 days.      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

## 2019-08-21 NOTE — Telephone Encounter (Unsigned)
Copied from [place] [phone]. Topic: Quick Communication - Rx Refill/Question >> Aug 21, 2019  4:59 PM Yvette Rack wrote: Medication: HYDROcodone-acetaminophen (NORCO) 10-325 MG tablet  Has the patient contacted their pharmacy? yes   Preferred Pharmacy (with phone number or street name): [id number] [place], [place] Phone: [phone]   Fax: [phone]  Agent: Please be advised that RX refills may take up to 3 business days. We ask that you follow-up with your pharmacy.

## 2019-08-22 MED ORDER — HYDROCODONE-ACETAMINOPHEN 10-325 MG PO TABS: 1.0000 | ORAL_TABLET | Freq: Three times a day (TID) | ORAL | 0 refills | Status: DC | PRN

## 2019-08-25 ENCOUNTER — Other Ambulatory Visit: Payer: Self-pay | Admitting: Family Medicine

## 2019-08-25 DIAGNOSIS — F411 Generalized anxiety disorder: Secondary | ICD-10-CM

## 2019-08-31 ENCOUNTER — Other Ambulatory Visit: Payer: Self-pay | Admitting: Internal Medicine

## 2019-08-31 MED ORDER — MEXILETINE HCL 250 MG PO CAPS: 250.0000 mg | ORAL_CAPSULE | Freq: Three times a day (TID) | ORAL | 2 refills | Status: DC

## 2019-08-31 NOTE — Telephone Encounter (Signed)
Pt's medication was sent to pt's pharmacy as requested. Confirmation received.  °

## 2019-09-08 ENCOUNTER — Ambulatory Visit (INDEPENDENT_AMBULATORY_CARE_PROVIDER_SITE_OTHER): Payer: PPO | Admitting: *Deleted

## 2019-09-08 DIAGNOSIS — I5042 Chronic combined systolic (congestive) and diastolic (congestive) heart failure: Secondary | ICD-10-CM

## 2019-09-08 LAB — CUP PACEART REMOTE DEVICE CHECK
Battery Remaining Longevity: 64 mo
Battery Voltage: 2.99 V
Brady Statistic RV Percent Paced: 0.03 %
Date Time Interrogation Session: 20210205165743
HighPow Impedance: 84 Ohm
Implantable Lead Implant Date: 20150318
Implantable Lead Location: 753860
Implantable Lead Model: 181
Implantable Lead Serial Number: 327195
Implantable Pulse Generator Implant Date: 20150318
Lead Channel Impedance Value: 608 Ohm
Lead Channel Impedance Value: 646 Ohm
Lead Channel Pacing Threshold Amplitude: 1 V
Lead Channel Pacing Threshold Pulse Width: 0.4 ms
Lead Channel Sensing Intrinsic Amplitude: 8.75 mV
Lead Channel Sensing Intrinsic Amplitude: 8.75 mV
Lead Channel Setting Pacing Amplitude: 2.25 V
Lead Channel Setting Pacing Pulse Width: 0.4 ms
Lead Channel Setting Sensing Sensitivity: 0.3 mV

## 2019-09-08 NOTE — Progress Notes (Signed)
ICD Remote  

## 2019-09-26 ENCOUNTER — Telehealth: Payer: Self-pay | Admitting: Family Medicine

## 2019-09-26 DIAGNOSIS — M5489 Other dorsalgia: Secondary | ICD-10-CM

## 2019-09-26 NOTE — Telephone Encounter (Signed)
RX REFILL HYDROcodone-acetaminophen ([place]) 10-325 MG tablet  PHARMACY [id number] [place], [place] - [address] [place] Phone:  [phone]  Fax:  [phone]

## 2019-09-27 MED ORDER — HYDROCODONE-ACETAMINOPHEN 10-325 MG PO TABS: 1.0000 | ORAL_TABLET | Freq: Three times a day (TID) | ORAL | 0 refills | Status: DC | PRN

## 2019-10-03 DIAGNOSIS — N2581 Secondary hyperparathyroidism of renal origin: Secondary | ICD-10-CM | POA: Diagnosis not present

## 2019-10-03 DIAGNOSIS — N184 Chronic kidney disease, stage 4 (severe): Secondary | ICD-10-CM | POA: Diagnosis not present

## 2019-10-03 DIAGNOSIS — I129 Hypertensive chronic kidney disease with stage 1 through stage 4 chronic kidney disease, or unspecified chronic kidney disease: Secondary | ICD-10-CM | POA: Diagnosis not present

## 2019-10-05 ENCOUNTER — Other Ambulatory Visit: Payer: Self-pay | Admitting: Student

## 2019-10-09 DIAGNOSIS — N184 Chronic kidney disease, stage 4 (severe): Secondary | ICD-10-CM | POA: Diagnosis not present

## 2019-10-09 DIAGNOSIS — I129 Hypertensive chronic kidney disease with stage 1 through stage 4 chronic kidney disease, or unspecified chronic kidney disease: Secondary | ICD-10-CM | POA: Diagnosis not present

## 2019-10-09 DIAGNOSIS — N2581 Secondary hyperparathyroidism of renal origin: Secondary | ICD-10-CM | POA: Diagnosis not present

## 2019-10-24 DIAGNOSIS — Z95810 Presence of automatic (implantable) cardiac defibrillator: Secondary | ICD-10-CM | POA: Insufficient documentation

## 2019-10-24 IMAGING — US US RENAL
1 series · 14 of 25 positions shown · non-contrast
Comparison: CT dated March 16, 2017

CLINICAL DATA: CKD stage 4

EXAM:
RENAL / URINARY TRACT ULTRASOUND COMPLETE

[Series 1: us renal · 0.24mm/px · 14 of 42 slices shown]
[im 1/42]
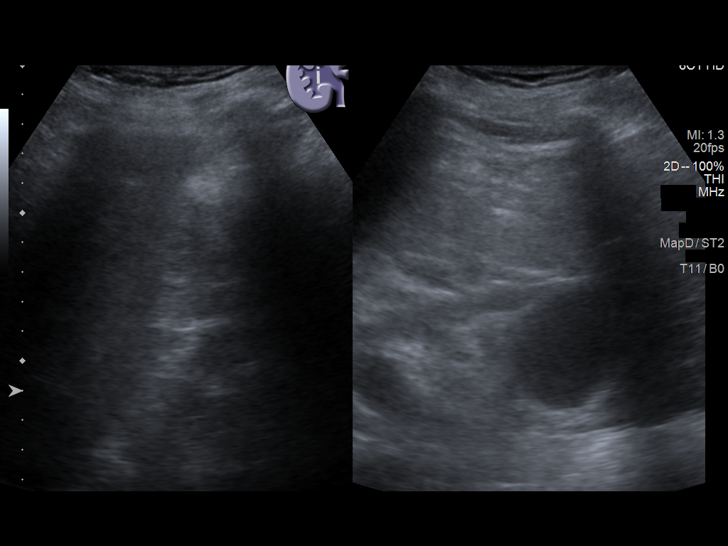
[im 4/42]
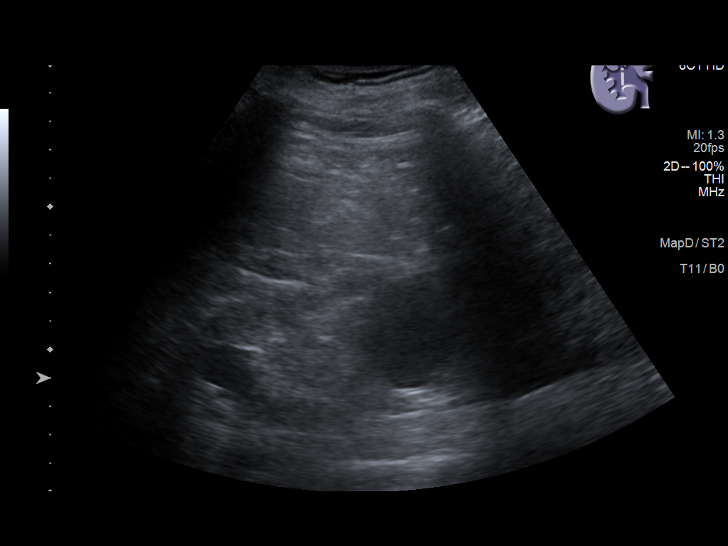
[im 7/42]
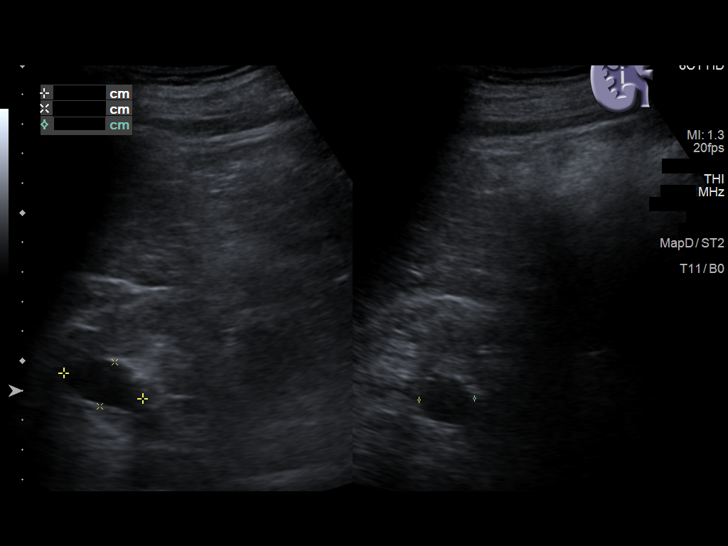
[im 11/42]
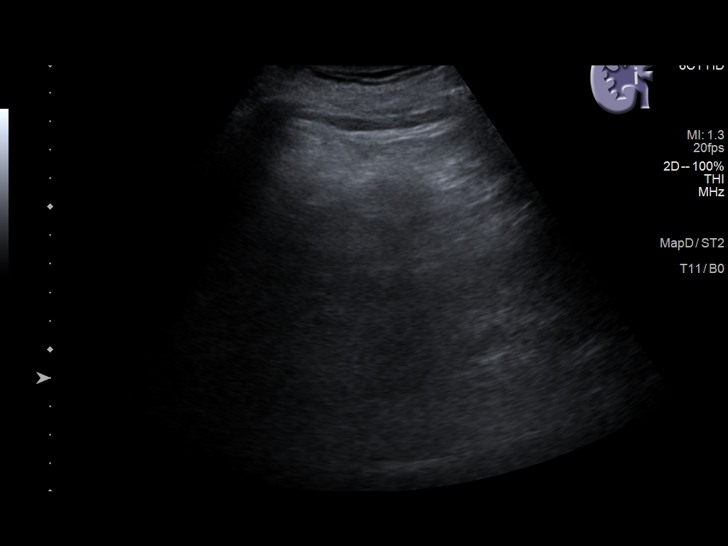
[im 14/42]
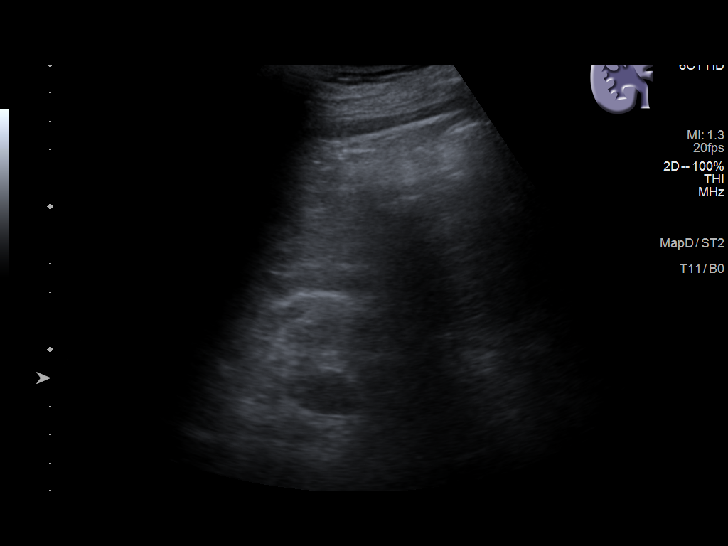
[im 16/42]
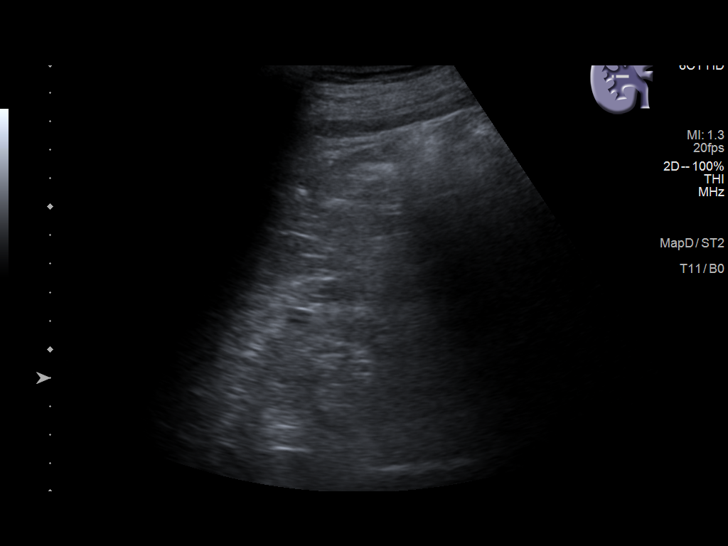
[im 19/42]
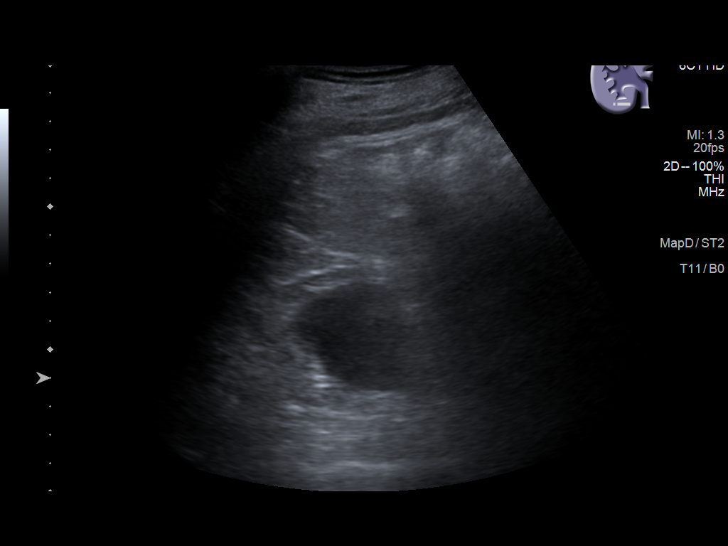
[im 23/42]
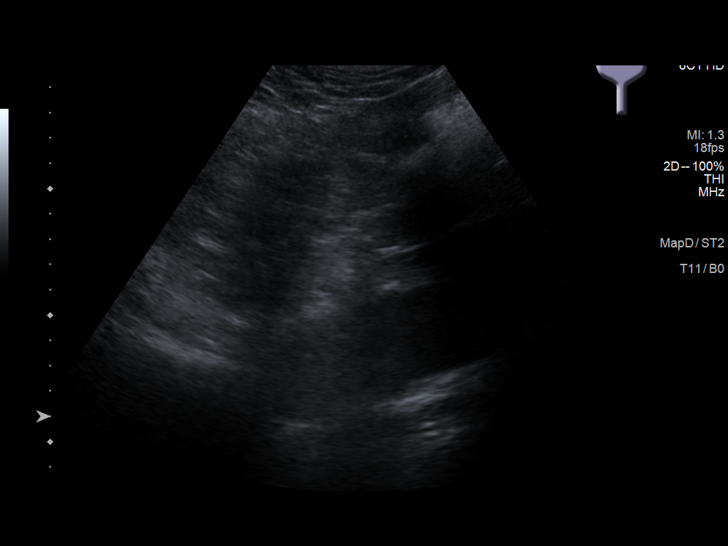
[im 26/42]
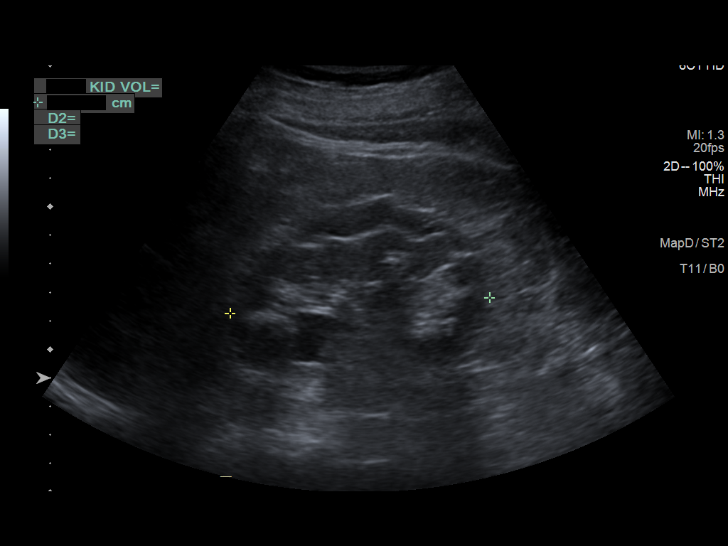
[im 28/42]
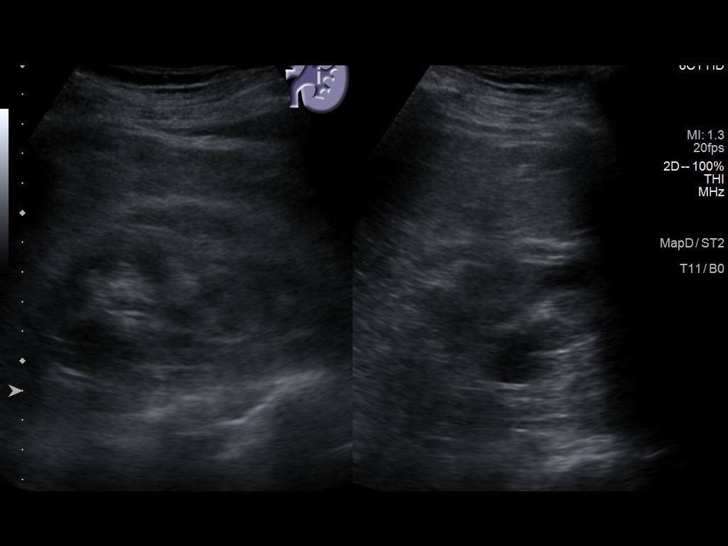
[im 31/42]
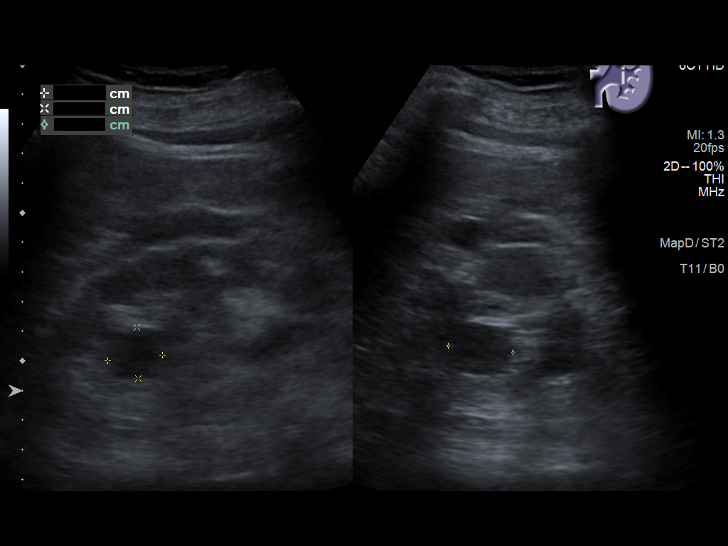
[im 35/42]
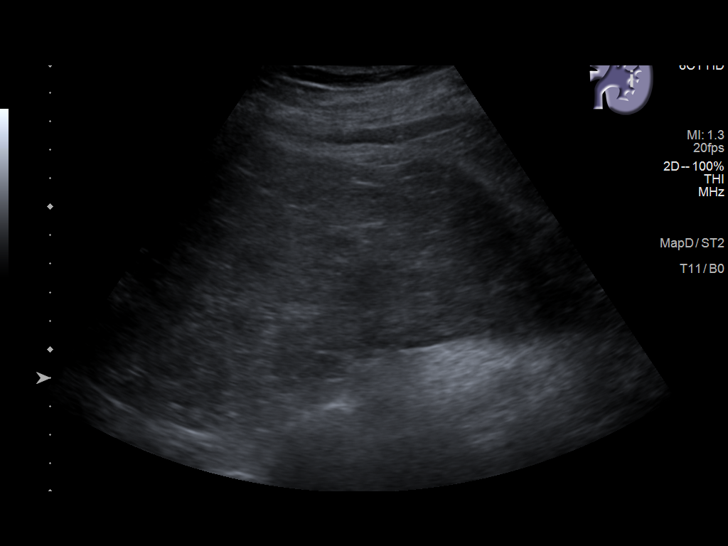
[im 38/42]
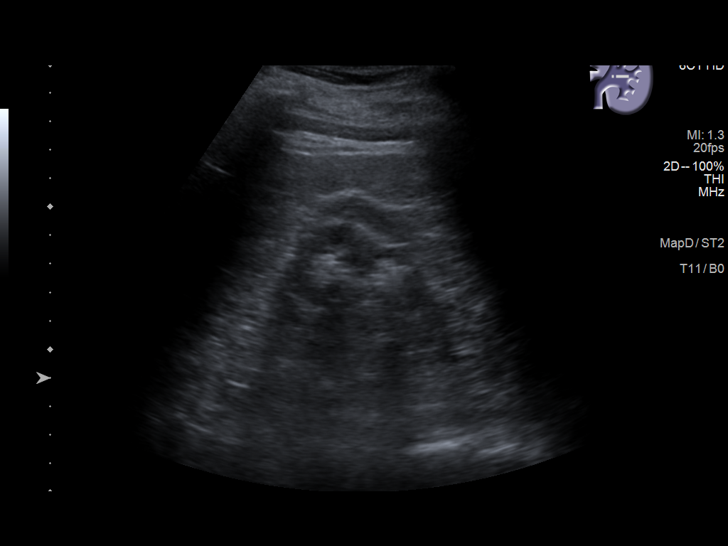
[im 42/42]
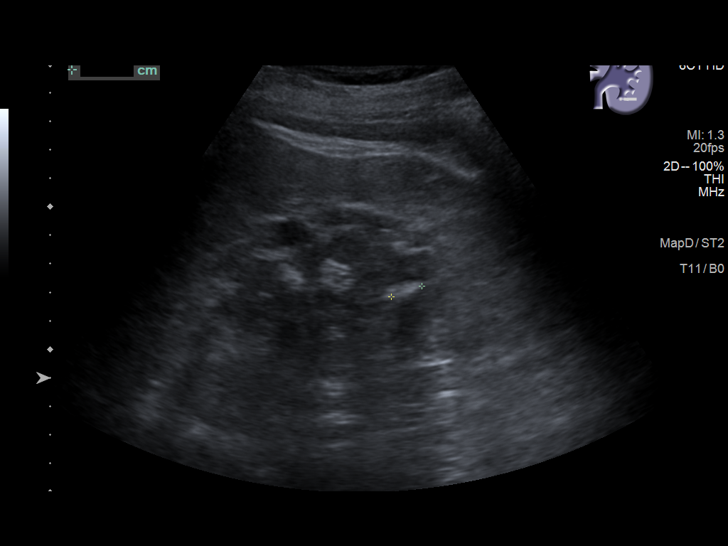

[14 of 25 positions shown; findings below may reference images not displayed]

FINDINGS: Right Kidney:

Renal measurements: 10.2 x 4.8 x 4.6 cm = volume: 119 mL. The
cortical echogenicity is increased. The cortex is lobular in
contour. There are multiple cystic structures measuring up to
approximately 4 cm arising from the lower pole. There is no
hydronephrosis.

Left Kidney:

Renal measurements: 9.1 x 4.5 x 5.2 cm = volume: 112 mL. There is a
lower pole stone measuring approximately 1 cm. There is no
hydronephrosis. The cortical echogenicity is increased. The contour
is lobular. Upper pole cysts are noted.

Bladder:

The bladder was decompressed and therefore poorly evaluated.
IMPRESSION: 1. No hydronephrosis.
2. Echogenic kidneys bilaterally which can be seen in patients with
medical renal disease.
3. Nonobstructing 1 cm stone in the lower pole the left kidney.
4. Lobular contour of both kidneys, similar to prior CT.
5. Bladder not evaluated secondary to underdistention.

## 2019-10-25 ENCOUNTER — Encounter: Payer: Self-pay | Admitting: Internal Medicine

## 2019-10-25 ENCOUNTER — Encounter (INDEPENDENT_AMBULATORY_CARE_PROVIDER_SITE_OTHER): Payer: Self-pay

## 2019-10-25 ENCOUNTER — Other Ambulatory Visit: Payer: Self-pay

## 2019-10-25 ENCOUNTER — Ambulatory Visit: Payer: PPO | Admitting: Internal Medicine

## 2019-10-25 VITALS — BP 126/92 | HR 59 | Ht 70.0 in | Wt 187.0 lb

## 2019-10-25 DIAGNOSIS — R001 Bradycardia, unspecified: Secondary | ICD-10-CM | POA: Diagnosis not present

## 2019-10-25 DIAGNOSIS — I472 Ventricular tachycardia, unspecified: Secondary | ICD-10-CM

## 2019-10-25 DIAGNOSIS — Z79899 Other long term (current) drug therapy: Secondary | ICD-10-CM | POA: Diagnosis not present

## 2019-10-25 DIAGNOSIS — E039 Hypothyroidism, unspecified: Secondary | ICD-10-CM | POA: Diagnosis not present

## 2019-10-25 DIAGNOSIS — I255 Ischemic cardiomyopathy: Secondary | ICD-10-CM

## 2019-10-25 DIAGNOSIS — Z95810 Presence of automatic (implantable) cardiac defibrillator: Secondary | ICD-10-CM | POA: Diagnosis not present

## 2019-10-25 LAB — CUP PACEART INCLINIC DEVICE CHECK
Date Time Interrogation Session: 20210324165733
Implantable Lead Implant Date: 20150318
Implantable Lead Location: 753860
Implantable Lead Model: 181
Implantable Lead Serial Number: 327195
Implantable Pulse Generator Implant Date: 20150318
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
## 2019-10-25 NOTE — Patient Instructions (Signed)
Medication Instructions:  Your physician recommends that you continue on your current medications as directed. Please refer to the Current Medication list given to you today.  Labwork: You will have labs drawn today:TSH Testing/Procedures: None ordered.  Follow-Up: Your physician wants you to follow-up in: 6 months with Dr [name]. You will receive a reminder letter in the mail two months in advance. If you don't receive a letter, please call our office to schedule the follow-up appointment.  Remote monitoring is used to monitor your Pacemaker of ICD from home. This monitoring reduces the number of office visits required to check your device to one time per year. It allows [place] to keep an eye on the functioning of your device to ensure it is working properly.   Any Other Special Instructions Will Be Listed Below (If Applicable).  If you need a refill on your cardiac medications before your next appointment, please call your pharmacy.

## 2019-10-26 LAB — TSH: TSH: 7.38 u[IU]/mL — ABNORMAL HIGH (ref 0.450–4.500)

## 2019-10-27 ENCOUNTER — Telehealth: Payer: Self-pay

## 2019-10-27 NOTE — Telephone Encounter (Signed)
-----   Message from [name], MD sent at 10/26/2019  9:36 PM EDT ----- Please Inform Patient  Thyroid Labs are still a little abnormal   plz increase his synthroid to 37.5 and have him followup with his PCP #  Thanks

## 2019-10-27 NOTE — Telephone Encounter (Signed)
Attempted to call patient. LMTCB 10/27/2019

## 2019-10-30 NOTE — Telephone Encounter (Signed)
Attempted to call patient. LMTCB 10/30/2019   

## 2019-10-31 NOTE — Telephone Encounter (Signed)
Attempted to call patient. LMTCB 10/31/2019  3rd attempt. Letter mailed.

## 2019-11-01 MED ORDER — LEVOTHYROXINE SODIUM 25 MCG PO TABS: 37.5000 ug | ORAL_TABLET | Freq: Every day | ORAL | 3 refills | Status: DC

## 2019-11-01 MED ORDER — METOPROLOL TARTRATE 25 MG PO TABS: 25.0000 mg | ORAL_TABLET | Freq: Every day | ORAL | 3 refills | Status: DC

## 2019-11-01 NOTE — Addendum Note (Signed)
Addended by: Verlon Au on: 11/01/2019 04:17 PM   Modules accepted: Orders

## 2019-11-01 NOTE — Telephone Encounter (Signed)
Call to patient to review labs.    Pt verbalized understanding and has no further questions at this time.    Advised pt to call for any further questions or concerns.  No further orders.   Rx updated.

## 2019-11-01 NOTE — Telephone Encounter (Signed)
Patient returning call also would like refill sent in   *STAT* If patient is at the pharmacy, call can be transferred to refill team.   1. Which medications need to be refilled? (please list name of each medication and dose if known) metoprolol 25 MG daily  2. Which pharmacy/location (including street and city if local pharmacy) is medication [facility] sent to? [facility] [address]   3. Do they need a 30 day or 90 day supply? 90 day

## 2019-11-08 ENCOUNTER — Other Ambulatory Visit: Payer: Self-pay | Admitting: Family Medicine

## 2019-11-08 DIAGNOSIS — M5489 Other dorsalgia: Secondary | ICD-10-CM

## 2019-11-08 NOTE — Telephone Encounter (Signed)
Copied from [place] [id number]. Topic: Quick Communication - Rx Refill/Question >> Nov 08, 2019 10:37 AM Leward Quan A wrote: Medication: HYDROcodone-acetaminophen (NORCO) 10-325 MG tablet   Has the patient contacted their pharmacy? Yes.   (Agent: If no, request that the patient contact the pharmacy for the refill.) (Agent: If yes, when and what did the pharmacy advise?)  Preferred Pharmacy (with phone number or street name): [id number] [place], [place]  Phone:  [phone] Fax:  [phone]     Agent: Please be advised that RX refills may take up to 3 business days. We ask that you follow-up with your pharmacy.

## 2019-11-08 NOTE — Telephone Encounter (Signed)
Requested medication (s) are due for refill today: yes  Requested medication (s) are on the active medication list: yes  Last refill:  2/24//21  Future visit scheduled: no  Notes to clinic:  not delegated    Requested Prescriptions  Pending Prescriptions Disp Refills   HYDROcodone-acetaminophen (NORCO) 10-325 MG tablet 60 tablet 0    Sig: Take 1 tablet by mouth every 8 (eight) hours as needed.      Not Delegated - Analgesics:  Opioid Agonist Combinations Failed - 11/08/2019 10:43 AM      Failed - This refill cannot be delegated      Failed - Urine Drug Screen completed in last 360 days.      Passed - Valid encounter within last 6 months    Recent Outpatient Visits           3 months ago Acute non-recurrent sinusitis, unspecified location   [place], Kelby Aline, FNP   10 months ago Edema, unspecified type   [facility] Birdie Sons, MD   11 months ago 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, stage 5 [facility])   [facility] Birdie Sons, MD   1 year ago Skin lesion of cheek   [facility] Birdie Sons, MD   1 year ago Fatigue, unspecified type   [facility] Birdie Sons, MD

## 2019-11-09 MED ORDER — HYDROCODONE-ACETAMINOPHEN 10-325 MG PO TABS: 1.0000 | ORAL_TABLET | Freq: Three times a day (TID) | ORAL | 0 refills | Status: DC | PRN

## 2019-12-03 ENCOUNTER — Other Ambulatory Visit: Payer: Self-pay | Admitting: Internal Medicine

## 2019-12-08 ENCOUNTER — Ambulatory Visit (INDEPENDENT_AMBULATORY_CARE_PROVIDER_SITE_OTHER): Payer: PPO | Admitting: *Deleted

## 2019-12-08 DIAGNOSIS — I472 Ventricular tachycardia, unspecified: Secondary | ICD-10-CM

## 2019-12-08 DIAGNOSIS — I5023 Acute on chronic systolic (congestive) heart failure: Secondary | ICD-10-CM

## 2019-12-08 LAB — CUP PACEART REMOTE DEVICE CHECK
Battery Remaining Longevity: 58 mo
Battery Voltage: 2.99 V
Brady Statistic RV Percent Paced: 0.08 %
Date Time Interrogation Session: 20210507001704
HighPow Impedance: 78 Ohm
Implantable Lead Implant Date: 20150318
Implantable Lead Location: 753860
Implantable Lead Model: 181
Implantable Lead Serial Number: 327195
Implantable Pulse Generator Implant Date: 20150318
Lead Channel Impedance Value: 608 Ohm
Lead Channel Impedance Value: 646 Ohm
Lead Channel Pacing Threshold Amplitude: 1.125 V
Lead Channel Pacing Threshold Pulse Width: 0.4 ms
Lead Channel Sensing Intrinsic Amplitude: 8.875 mV
Lead Channel Sensing Intrinsic Amplitude: 8.875 mV
Lead Channel Setting Pacing Amplitude: 2.25 V
Lead Channel Setting Pacing Pulse Width: 0.4 ms
Lead Channel Setting Sensing Sensitivity: 0.3 mV

## 2019-12-08 NOTE — Progress Notes (Signed)
Remote ICD transmission.   

## 2019-12-14 ENCOUNTER — Other Ambulatory Visit: Payer: Self-pay | Admitting: Family Medicine

## 2019-12-14 DIAGNOSIS — M5489 Other dorsalgia: Secondary | ICD-10-CM

## 2019-12-14 NOTE — Telephone Encounter (Signed)
Medication Refill - Medication: HYDROcodone-acetaminophen (NORCO) 10-325 MG tablet   Has the patient contacted their pharmacy? No. (Agent: If no, request that the patient contact the pharmacy for the refill.) (Agent: If yes, when and what did the pharmacy advise?)  Preferred Pharmacy (with phone number or street name):  [id number] [place], [place] Phone:  [phone]  Fax:  [phone]       Agent: Please be advised that RX refills may take up to 3 business days. We ask that you follow-up with your pharmacy.

## 2019-12-15 MED ORDER — HYDROCODONE-ACETAMINOPHEN 10-325 MG PO TABS: 1.0000 | ORAL_TABLET | Freq: Three times a day (TID) | ORAL | 0 refills | Status: DC | PRN

## 2019-12-21 NOTE — Progress Notes (Signed)
Trena Platt Cummings,acting as a scribe for Lelon Huh, MD.,have documented all relevant documentation on the behalf of Lelon Huh, MD,as directed by  Lelon Huh, MD while in the presence of Lelon Huh, MD. Established patient visit   Patient: Richard Cook   DOB: Dec 13, 1955   64 y.o. Male  MRN: [id number] Visit Date: 12/22/2019  Today's healthcare provider: Lelon Huh, MD   Chief Complaint  Patient presents with  . Hypothyroidism   Subjective    HPI Hypothyroid, follow-up  Lab Results  Component Value Date   TSH 7.380 (H) 10/25/2019   TSH 5.850 (H) 07/17/2019   TSH 12.500 (H) 05/25/2019   FREET4 1.01 07/17/2019   FREET4 1.28 10/25/2017   Wt Readings from Last 3 Encounters:  12/22/19 191 lb 3.2 oz (86.7 kg)  10/25/19 187 lb (84.8 kg)  06/15/19 185 lb (83.9 kg)    Had labs done by Dr, [name] in March with TSH as above.  Management since that visit includes labs done. Patient advised to increase Levothyroxine to 37.5 mg on 10/25/2019. He reports excellent compliance with treatment. He is not having side effects.   Symptoms: No change in energy level No constipation  No diarrhea No heat / cold intolerance  No nervousness No palpitations  No weight changes    -----------------------------------------------------------------------------------------   Social History   Tobacco Use  . Smoking status: Never Smoker  . Smokeless tobacco: Never Used  Substance Use Topics  . Alcohol use: No    Comment: 10/07/2016 "quit in the early 1990s"  . Drug use: No       Medications: Outpatient Medications Prior to Visit  Medication Sig  . allopurinol (ZYLOPRIM) 300 MG tablet TAKE 1 TABLET BY MOUTH EVERY DAY  . amiodarone (PACERONE) 200 MG tablet Take 1.5 tablets (300 mg total) by mouth daily. You may take this in the morning  . aspirin EC 81 MG tablet Take 81 mg by mouth daily.  Marland Kitchen BEE POLLEN PO Take 1 capsule by mouth daily.   . bumetanide (BUMEX) 2 MG  tablet Take 2 mg by mouth daily.  . clonazePAM (KLONOPIN) 1 MG tablet TAKE ONE TABLET EVERY AFTERNOON AND 1 & 1/2 TABLET AT BEDTIME  . HYDROcodone-acetaminophen (NORCO) 10-325 MG tablet Take 1 tablet by mouth every 8 (eight) hours as needed.  Marland Kitchen levothyroxine (SYNTHROID) 25 MCG tablet Take 1.5 tablets (37.5 mcg total) by mouth daily before breakfast.  . metoprolol tartrate (LOPRESSOR) 25 MG tablet Take 1 tablet (25 mg total) by mouth daily.  Marland Kitchen mexiletine (MEXITIL) 250 MG capsule Take 1 capsule (250 mg total) by mouth 3 (three) times daily.  . Omega-3 Fatty Acids (FISH OIL PO) Take 1 capsule by mouth daily.  . furosemide (LASIX) 40 MG tablet TAKE 1 TABLET (40 MG TOTAL) BY MOUTH DAILY AS NEEDED FOR FLUID. (Patient not taking: Reported on 12/22/2019)  . linaclotide (LINZESS) 72 MCG capsule Take 1 capsule (72 mcg total) by mouth daily before breakfast. (Patient not taking: Reported on 12/22/2019)  . mupirocin ointment (BACTROBAN) 2 % Apply 1 application topically 2 (two) times daily. Right nare apply small amount. (Patient not taking: Reported on 12/22/2019)  . [DISCONTINUED] doxycycline (VIBRA-TABS) 100 MG tablet Take 1 tablet (100 mg total) by mouth 2 (two) times daily. (Patient not taking: Reported on 12/22/2019)  . [DISCONTINUED] predniSONE (STERAPRED UNI-PAK 21 TAB) 10 MG (21) TBPK tablet PO: Take 6 tablets on day 1:Take 5 tablets day 2:Take 4 tablets day 3: Take 3  tablets day 4:Take 2 tablets day five: 5 Take 1 tablet day 6 (Patient not taking: Reported on 12/22/2019)   No facility-administered medications prior to visit.        Objective    BP 106/67 (BP Location: Left Arm, Patient Position: Sitting, Cuff Size: Normal)   Pulse (!) 51   Temp (!) 97.1 F (36.2 C) (Temporal)   Ht 5\' 10"  (1.778 m)   Wt 191 lb 3.2 oz (86.7 kg)   BMI 27.43 kg/m     Physical Exam   General: Appearance:     Overweight male in no acute distress  Eyes:    PERRL, conjunctiva/corneas clear, EOM's intact         Lungs:     Clear to auscultation bilaterally, respirations unlabored  Heart:    Bradycardic. Normal rhythm. No murmurs, rubs, or gallops.   MS:   All extremities are intact.   Neurologic:   Awake, alert, oriented x 3. No apparent focal neurological           defect.        No results found for any visits on 12/22/19.  Assessment & Plan     1. Chronic combined systolic and diastolic CHF (congestive heart failure) ([place]) Stable continue routine follow up with Dr, [name].   2. 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, stage 5 ([facility]) Stable, continue routine follow up with Dr. Juleen China  3. CAD- PCI to RCA 08/10/11, CABG in [place] 5/14 Asymptomatic. Compliant with medication.  Continue aggressive risk factor modification.   - CBC - Lipid panel  4. Hypothyroidism, unspecified type  - TSH  5. Hypomagnesemia  - Magnesium  6. Vitamin D deficiency Secondary to CKD managed by Dr. Juleen China     The entirety of the information documented in the History of Present Illness, Review of Systems and Physical Exam were personally obtained by me. Portions of this information were initially documented by the CMA and reviewed by me for thoroughness and accuracy.      Lelon Huh, MD  [facility] [phone] (phone) [phone] (fax)  [place]

## 2019-12-22 ENCOUNTER — Encounter: Payer: Self-pay | Admitting: Family Medicine

## 2019-12-22 ENCOUNTER — Ambulatory Visit (INDEPENDENT_AMBULATORY_CARE_PROVIDER_SITE_OTHER): Payer: PPO | Admitting: Family Medicine

## 2019-12-22 ENCOUNTER — Other Ambulatory Visit: Payer: Self-pay

## 2019-12-22 VITALS — BP 106/67 | HR 51 | Temp 97.1°F | Ht 70.0 in | Wt 191.2 lb

## 2019-12-22 DIAGNOSIS — E039 Hypothyroidism, unspecified: Secondary | ICD-10-CM

## 2019-12-22 DIAGNOSIS — I5042 Chronic combined systolic (congestive) and diastolic (congestive) heart failure: Secondary | ICD-10-CM

## 2019-12-22 DIAGNOSIS — I2510 Atherosclerotic heart disease of native coronary artery without angina pectoris: Secondary | ICD-10-CM

## 2019-12-22 DIAGNOSIS — N185 Chronic kidney disease, stage 5: Secondary | ICD-10-CM

## 2019-12-22 DIAGNOSIS — E559 Vitamin D deficiency, unspecified: Secondary | ICD-10-CM

## 2019-12-23 LAB — CBC
Hematocrit: 46.2 % (ref 37.5–51.0)
Hemoglobin: 15.8 g/dL (ref 13.0–17.7)
MCH: 33.4 pg — ABNORMAL HIGH (ref 26.6–33.0)
MCHC: 34.2 g/dL (ref 31.5–35.7)
MCV: 98 fL — ABNORMAL HIGH (ref 79–97)
Platelets: 164 10*3/uL (ref 150–450)
RBC: 4.73 x10E6/uL (ref 4.14–5.80)
RDW: 13.1 % (ref 11.6–15.4)
WBC: 5.3 10*3/uL (ref 3.4–10.8)

## 2019-12-23 LAB — LIPID PANEL
Chol/HDL Ratio: 5.5 ratio — ABNORMAL HIGH (ref 0.0–5.0)
Cholesterol, Total: 249 mg/dL — ABNORMAL HIGH (ref 100–199)
HDL: 45 mg/dL (ref >39)
LDL Chol Calc (NIH): 182 mg/dL — ABNORMAL HIGH (ref 0–99)
Triglycerides: 124 mg/dL (ref 0–149)
VLDL Cholesterol Cal: 22 mg/dL (ref 5–40)

## 2019-12-23 LAB — MAGNESIUM: Magnesium: 2 mg/dL (ref 1.6–2.3)

## 2019-12-23 LAB — TSH: TSH: 7.68 u[IU]/mL — ABNORMAL HIGH (ref 0.450–4.500)

## 2019-12-25 ENCOUNTER — Telehealth: Payer: Self-pay

## 2019-12-25 DIAGNOSIS — E039 Hypothyroidism, unspecified: Secondary | ICD-10-CM

## 2019-12-25 NOTE — Telephone Encounter (Signed)
Attempted to contact patient, no answer left a voicemail. Okay for PEC to advise patient.  

## 2019-12-25 NOTE — Telephone Encounter (Signed)
Patient called, left VM to return the call to the office for results.  

## 2019-12-25 NOTE — Telephone Encounter (Signed)
-----   Message from Birdie Sons, MD sent at 12/25/2019  1:18 PM EDT ----- Cholesterol is very high. It looks like the rosuvastatin was taken off his medication list about 2 years ago. Did have any side effects from rosuvastatin? If not then needs to restart 40mg  daily #30 and refill x 12. Otherwise we will need to try a different cholesterol medication.

## 2020-01-02 NOTE — Telephone Encounter (Signed)
Tried calling patient. Left message to call back. 

## 2020-01-03 NOTE — Telephone Encounter (Signed)
Is slightly hyPOthyroid. If he's currently taking 1 1/2 of the 31mcg tablets then I would recommend changing to one 18mcg tablet daily. Ok to send in #30 and rf x 3. He can finish up the 25s by taking 2 tablets daily.  Schedule a follow up in 2-3 months to check thyroid and lipids.

## 2020-01-03 NOTE — Telephone Encounter (Signed)
Patient had a question about TSH levels and whether you wanted to make any changes. Please advise.

## 2020-01-03 NOTE — Telephone Encounter (Signed)
Attempted to call pt.  Left vm to return call to office to discuss recent lab results/ recommendations.

## 2020-01-03 NOTE — Telephone Encounter (Signed)
Result note read to pt, verbalizes understanding. Pt states he did not have any side effects from rosuvastatin and is not sure why it was removed from med profile. Pt is agreeable to start med.   Also questioning if Dr. Caryn Section reviewed TSH levels and any changes necessary.

## 2020-01-05 ENCOUNTER — Ambulatory Visit: Payer: Self-pay | Admitting: *Deleted

## 2020-01-05 MED ORDER — ROSUVASTATIN CALCIUM 40 MG PO TABS: 40.0000 mg | ORAL_TABLET | Freq: Every day | ORAL | 12 refills | Status: DC

## 2020-01-05 MED ORDER — LEVOTHYROXINE SODIUM 50 MCG PO TABS: 50.0000 ug | ORAL_TABLET | Freq: Every day | ORAL | 3 refills | Status: DC

## 2020-01-05 NOTE — Telephone Encounter (Signed)
Pt called in requesting his thyroid level and had a question about restarting his cholesterol medication.  "I've been on the [place] and I don't get a cell signal out here".   "I'm out here with students".  I read him Dr. Maralyn Sago note dated 01/03/2020 at 3:49 PM.   He verbalized understanding to take 2 of the thyroid pills he is currently taking to equal the 50 mcg prescribed.    I let him know the change in dose would be called into the pharmacy so he would have the new dose on his next refill.  He also asked about the cholesterol medication being restarted.  "I was on it before and he wants me to restart it".   Has this been called in also?  I forwarded this information to Dr. Maralyn Sago office.

## 2020-01-05 NOTE — Telephone Encounter (Signed)
Marijo Conception, RN 55 minutes ago (1:43 PM)     Pt called in requesting his thyroid level and had a question about restarting his cholesterol medication.  "I've been on the [place] and I don't get a cell signal out here".   "I'm out here with students".  I read him Dr. Maralyn Sago note dated 01/03/2020 at 3:49 PM.   He verbalized understanding to take 2 of the thyroid pills he is currently taking to equal the 50 mcg prescribed.    I let him know the change in dose would be called into the pharmacy so he would have the new dose on his next refill.  He also asked about the cholesterol medication being restarted.  "I was on it before and he wants me to restart it".   Has this been called in also?  I forwarded this information to Dr. Maralyn Sago office.

## 2020-01-05 NOTE — Addendum Note (Signed)
Addended by: Randal Buba on: 01/05/2020 02:47 PM   Modules accepted: Orders

## 2020-01-05 NOTE — Telephone Encounter (Signed)
Prescription for Levothyroxine 37mcg and Rosuvastatin sent into pharmacy.

## 2020-01-05 NOTE — Telephone Encounter (Signed)
Tried calling patient. Left message to call back. OK for [facility] triage to advise patient of message and rout message back to the office with patients response.

## 2020-01-10 DIAGNOSIS — N2581 Secondary hyperparathyroidism of renal origin: Secondary | ICD-10-CM | POA: Diagnosis not present

## 2020-01-10 DIAGNOSIS — N184 Chronic kidney disease, stage 4 (severe): Secondary | ICD-10-CM | POA: Diagnosis not present

## 2020-01-10 DIAGNOSIS — I129 Hypertensive chronic kidney disease with stage 1 through stage 4 chronic kidney disease, or unspecified chronic kidney disease: Secondary | ICD-10-CM | POA: Diagnosis not present

## 2020-01-15 DIAGNOSIS — I129 Hypertensive chronic kidney disease with stage 1 through stage 4 chronic kidney disease, or unspecified chronic kidney disease: Secondary | ICD-10-CM | POA: Diagnosis not present

## 2020-01-15 DIAGNOSIS — N2581 Secondary hyperparathyroidism of renal origin: Secondary | ICD-10-CM | POA: Diagnosis not present

## 2020-01-15 DIAGNOSIS — N184 Chronic kidney disease, stage 4 (severe): Secondary | ICD-10-CM | POA: Diagnosis not present

## 2020-01-17 ENCOUNTER — Other Ambulatory Visit: Payer: Self-pay | Admitting: Family Medicine

## 2020-01-17 DIAGNOSIS — M5489 Other dorsalgia: Secondary | ICD-10-CM

## 2020-01-17 NOTE — Telephone Encounter (Signed)
Medication Refill - Medication: HYDROcodone-acetaminophen ([name]) 10-325 MG tablet [[id number]]    Preferred Pharmacy (with phone number or street name):  [id number] - [place], [place] [address]  Phone: [phone] Fax: [phone]     Agent: Please be advised that RX refills may take up to 3 business days. We ask that you follow-up with your pharmacy.

## 2020-01-17 NOTE — Telephone Encounter (Signed)
Requested medication (s) are due for refill today - yes  Requested medication (s) are on the active medication list -yes  Future visit scheduled -no  Last refill: 12/15/19  Notes to clinic: Request for non delegated Rx  Requested Prescriptions  Pending Prescriptions Disp Refills   HYDROcodone-acetaminophen (NORCO) 10-325 MG tablet 60 tablet 0    Sig: Take 1 tablet by mouth every 8 (eight) hours as needed.      Not Delegated - Analgesics:  Opioid Agonist Combinations Failed - 01/17/2020 11:39 AM      Failed - This refill cannot be delegated      Failed - Urine Drug Screen completed in last 360 days.      Passed - Valid encounter within last 6 months    Recent Outpatient Visits           3 weeks ago CAD- PCI to RCA 08/10/11, CABG in [place] 5/14   [place], Kirstie Peri, MD   6 months ago Acute non-recurrent sinusitis, unspecified location   [facility] [place], Kelby Aline, FNP   1 year ago Edema, unspecified type   [facility] Birdie Sons, MD   1 year ago 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, stage 5 [facility])   [facility] Birdie Sons, MD   1 year ago Skin lesion of Turner, Donald E, MD                  Requested Prescriptions  Pending Prescriptions Disp Refills   HYDROcodone-acetaminophen (NORCO) 10-325 MG tablet 60 tablet 0    Sig: Take 1 tablet by mouth every 8 (eight) hours as needed.      Not Delegated - Analgesics:  Opioid Agonist Combinations Failed - 01/17/2020 11:39 AM      Failed - This refill cannot be delegated      Failed - Urine Drug Screen completed in last 360 days.      Passed - Valid encounter within last 6 months    Recent Outpatient Visits           3 weeks ago CAD- PCI to RCA 08/10/11, CABG in [place] 5/14   [place], Kirstie Peri, MD   6 months ago Acute non-recurrent sinusitis, unspecified  location   [place], Kelby Aline, FNP   1 year ago Edema, unspecified type   [facility] Birdie Sons, MD   1 year ago 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, stage 5 [facility])   [facility] Birdie Sons, MD   1 year ago Skin lesion of cheek   [facility] Birdie Sons, MD

## 2020-01-18 MED ORDER — HYDROCODONE-ACETAMINOPHEN 10-325 MG PO TABS: 1.0000 | ORAL_TABLET | Freq: Three times a day (TID) | ORAL | 0 refills | Status: DC | PRN

## 2020-01-19 NOTE — Progress Notes (Signed)
Established patient visit   Patient: Richard Cook   DOB: 1956-02-05   64 y.o. Male  MRN: [id number] Visit Date: 01/22/2020  Today's healthcare provider: Lelon Huh, MD   Chief Complaint  Patient presents with  . Insomnia   I,Latasha Walston,acting as a scribe for Lelon Huh, MD.,have documented all relevant documentation on the behalf of Lelon Huh, MD,as directed by  Lelon Huh, MD while in the presence of Lelon Huh, MD.  Subjective    HPI Pruritus: Patient complains of itchy scalp. He states he has tried dry scalp shampoo with no relief. Has been going on for the last month. He has been out in the sun quite a bit but does not usually wear a hat.   Follow up for Insomnia:   The patient was last seen for this several times over the last 2-3 years  He has tried multiple medications which he eight did not tolerate or were not effective. He has been taking clonazepam for many years which he states used to make him sleepy, but does not help him sleep anymore despite making him feel very groggy the next day.  He reports good compliance with treatment. He feels that condition is Unchanged. He is not having side effects. He reports he had a sleep study that was normal.   He states he does feels like he has some anxiety and PTSD issues related to his medical problems and social issues over his life, which he feels is contributing to anxiety and inability to sleep.  -----------------------------------------------------------------------------------------      Medications: Outpatient Medications Prior to Visit  Medication Sig  . allopurinol (ZYLOPRIM) 300 MG tablet TAKE 1 TABLET BY MOUTH EVERY DAY  . amiodarone (PACERONE) 200 MG tablet Take 1.5 tablets (300 mg total) by mouth daily. You may take this in the morning  . aspirin EC 81 MG tablet Take 81 mg by mouth daily.  Marland Kitchen BEE POLLEN PO Take 1 capsule by mouth daily.   . bumetanide (BUMEX) 2 MG tablet Take 2  mg by mouth daily.  . clonazePAM (KLONOPIN) 1 MG tablet TAKE ONE TABLET EVERY AFTERNOON AND 1 & 1/2 TABLET AT BEDTIME  . furosemide (LASIX) 40 MG tablet TAKE 1 TABLET (40 MG TOTAL) BY MOUTH DAILY AS NEEDED FOR FLUID.  Marland Kitchen HYDROcodone-acetaminophen (NORCO) 10-325 MG tablet Take 1 tablet by mouth every 8 (eight) hours as needed.  Marland Kitchen levothyroxine (SYNTHROID) 50 MCG tablet Take 1 tablet (50 mcg total) by mouth daily.  Marland Kitchen linaclotide (LINZESS) 72 MCG capsule Take 1 capsule (72 mcg total) by mouth daily before breakfast.  . metoprolol tartrate (LOPRESSOR) 25 MG tablet Take 1 tablet (25 mg total) by mouth daily.  Marland Kitchen mexiletine (MEXITIL) 250 MG capsule Take 1 capsule (250 mg total) by mouth 3 (three) times daily.  . mupirocin ointment (BACTROBAN) 2 % Apply 1 application topically 2 (two) times daily. Right nare apply small amount.  . Omega-3 Fatty Acids (FISH OIL PO) Take 1 capsule by mouth daily.  . rosuvastatin (CRESTOR) 40 MG tablet Take 1 tablet (40 mg total) by mouth daily.   No facility-administered medications prior to visit.    Review of Systems  Constitutional: Negative for appetite change, chills and fever.  Respiratory: Negative for chest tightness, shortness of breath and wheezing.   Cardiovascular: Negative for chest pain and palpitations.  Gastrointestinal: Negative for abdominal pain, nausea and vomiting.  Skin:       Itchy scalp  Objective    BP (!) 91/59 (BP Location: Right Arm, Patient Position: Sitting, Cuff Size: Normal)   Pulse (!) 53   Temp (!) 97.3 F (36.3 C) (Temporal)   Wt 186 lb (84.4 kg)   BMI 26.69 kg/m    Physical Exam   General appearance:  Well developed, well nourished male, cooperative and in no acute distress Head: Normocephalic, without obvious abnormality, atraumatic Skin: Mild flaking and redness of scalp.  Psych: Appropriate mood and affect. Neurologic: Mental status: Alert, oriented to person, place, and time, thought content appropriate.    No results found for any visits on 01/22/20.  Assessment & Plan     1. Seborrhea capitis  - hydrocortisone 1 % lotion; Apply 1 application topically 2 (two) times daily.  Dispense: 118 mL; Refill: 1 Recommended referral to dermatology if not improving in 7-10 days. Strongly encouraged cover scalp with hat when outside during the day  2. Insomnia, unspecified type Failed or did not tolerate several prescription sleep aides. Will try- ramelteon (ROZEREM) 8 MG tablet; Take 1 tablet (8 mg total) by mouth at bedtime.  Dispense: 30 tablet; Refill: 5  3. PTSD (post-traumatic stress disorder)  - Ambulatory referral to Psychology   No follow-ups on file.      The entirety of the information documented in the History of Present Illness, Review of Systems and Physical Exam were personally obtained by me. Portions of this information were initially documented by the CMA and reviewed by me for thoroughness and accuracy.      Lelon Huh, MD  [facility] [phone] (phone) [phone] (fax)  [place]

## 2020-01-22 ENCOUNTER — Encounter: Payer: Self-pay | Admitting: Family Medicine

## 2020-01-22 ENCOUNTER — Other Ambulatory Visit: Payer: Self-pay

## 2020-01-22 ENCOUNTER — Ambulatory Visit (INDEPENDENT_AMBULATORY_CARE_PROVIDER_SITE_OTHER): Payer: PPO | Admitting: Family Medicine

## 2020-01-22 ENCOUNTER — Telehealth: Payer: Self-pay | Admitting: Family Medicine

## 2020-01-22 VITALS — BP 91/59 | HR 53 | Temp 97.3°F | Wt 186.0 lb

## 2020-01-22 DIAGNOSIS — L210 Seborrhea capitis: Secondary | ICD-10-CM

## 2020-01-22 DIAGNOSIS — G4700 Insomnia, unspecified: Secondary | ICD-10-CM | POA: Diagnosis not present

## 2020-01-22 DIAGNOSIS — F4310 Post-traumatic stress disorder, unspecified: Secondary | ICD-10-CM

## 2020-01-22 MED ORDER — RAMELTEON 8 MG PO TABS: 8.0000 mg | ORAL_TABLET | Freq: Every day | ORAL | 5 refills | Status: DC

## 2020-01-22 MED ORDER — HYDROCORTISONE 1 % EX LOTN: 1.0000 "application " | TOPICAL_LOTION | Freq: Two times a day (BID) | CUTANEOUS | 1 refills | Status: DC

## 2020-01-22 NOTE — Telephone Encounter (Signed)
No, that's the only one he has already tried.

## 2020-01-22 NOTE — Patient Instructions (Addendum)
•   Please review the attached list of medications and notify my office if there are any errors.    Be sure to wear a hat whenever you are outside in the sun.    Call for dermatology referral if the itching in your scalp is not better in a week of two

## 2020-01-22 NOTE — Telephone Encounter (Signed)
  Pt was prescribed ramelteon (ROZEREM) 8 MG tablet [[id number]]   today [id number] . Pt states medication to costly ( $400) and he isn't able to purchase . He is wanting to know if there is another option . Please advise

## 2020-01-23 ENCOUNTER — Telehealth: Payer: Self-pay

## 2020-01-23 NOTE — Telephone Encounter (Signed)
Called patient back. He was upset because there was not any alternate medications for him to try. He stated "he will just find a new doctor"

## 2020-01-23 NOTE — Telephone Encounter (Signed)
Copied from [place] [phone]. Topic: Complaint - Provider (non-sensitive) >> Jan 23, 2020  2:28 PM Sheran Luz wrote: Patient requesting call back from practice admin to discuss recent office visit.

## 2020-01-23 NOTE — Telephone Encounter (Signed)
Resent to office for pt callback.  Per initial encounter, "Pt was prescribed ramelteon (ROZEREM) 8 MG tablet [[id number]]  today [id number] . Pt states medication to costly ( $400) and he isn't able to purchase . He is wanting to know if there is another option . Please advise" ; the pt states that has tried several different medications; he would like to know if there is a different option because he can not afford to pay for the ramelteon; he would like a call back regarding this; the pt can be contacted at [phone].

## 2020-01-23 NOTE — Telephone Encounter (Signed)
Message below from Dr. Caryn Section states there is not any alternate medications that patient has not already tried.

## 2020-01-23 NOTE — Telephone Encounter (Signed)
Copied from [place] [phone]. Topic: Complaint - Provider (non-sensitive) >> Jan 23, 2020  2:28 PM Richard Cook wrote: Patient requesting call back from practice admin to discuss recent office visit.

## 2020-01-23 NOTE — Telephone Encounter (Signed)
Attempted to contact patient, no answer left a voicemail. Okay for [facility] triage to advise patient there is not any alternate medications that he has not already tried.

## 2020-01-23 NOTE — Telephone Encounter (Signed)
Per initial encounter, "Pt was prescribed ramelteon (ROZEREM) 8 MG tablet [[id number]]  today [id number] . Pt states medication to costly ( $400) and he isn't able to purchase . He is wanting to know if there is another option . Please advise" ; the pt states that has tried several different medications; he would like to know if there is a different option because he can not afford to pay for the ramelteon; he would like a call back regarding this; the pt can be contacted at [phone].

## 2020-01-24 NOTE — Telephone Encounter (Signed)
Patient called again and wanted to speak with Arbie Cookey in regards to a visit.  He did not want to give any other information.  Please advise and call patient back at [phone]

## 2020-01-25 NOTE — Telephone Encounter (Signed)
Patient requested update on psychology referral.

## 2020-01-25 NOTE — Telephone Encounter (Signed)
Called and spoke the patient.  He stated he didn't want to find another provider and he felt like the provider wasn't concerned about his medical needs.    He wants to know why there isn't anything else for him to take for sleeping or what his options are.  Also, he wanted to make sure his referral to psychology.    Pt requested to speak to the provider regarding this concern.   [facility] [address]  [phone]

## 2020-01-26 ENCOUNTER — Telehealth: Payer: Self-pay | Admitting: Family Medicine

## 2020-01-26 MED ORDER — HYDROXYZINE HCL 25 MG PO TABS: 25.0000 mg | ORAL_TABLET | Freq: Every evening | ORAL | 0 refills | Status: DC | PRN

## 2020-01-26 NOTE — Telephone Encounter (Signed)
Have sent prescription for hydroxyzine to pharmacy to see if it will help sleep, but he shouldn't take it more that twice a week. We can always try the Lunesta or Ambien again, but there's not really any other sleep medications that are safe to take with his cardiac medications.  Richard Cook is working on the psychology referral. The psychologist should be able to help with sleep issues too.

## 2020-01-26 NOTE — Telephone Encounter (Signed)
Patient advised.

## 2020-01-29 ENCOUNTER — Telehealth: Payer: Self-pay

## 2020-01-29 NOTE — Telephone Encounter (Signed)
No answer, left message to call in regards to [place] visits.

## 2020-01-30 ENCOUNTER — Other Ambulatory Visit: Payer: Self-pay

## 2020-01-30 ENCOUNTER — Ambulatory Visit: Payer: PPO | Attending: Pain Medicine | Admitting: Pain Medicine

## 2020-01-30 ENCOUNTER — Encounter: Payer: Self-pay | Admitting: Pain Medicine

## 2020-01-30 VITALS — BP 121/94 | HR 99 | Temp 98.4°F | Resp 16 | Ht 70.0 in | Wt 179.0 lb

## 2020-01-30 DIAGNOSIS — M545 Low back pain, unspecified: Secondary | ICD-10-CM

## 2020-01-30 DIAGNOSIS — M47816 Spondylosis without myelopathy or radiculopathy, lumbar region: Secondary | ICD-10-CM

## 2020-01-30 DIAGNOSIS — M7918 Myalgia, other site: Secondary | ICD-10-CM | POA: Diagnosis not present

## 2020-01-30 DIAGNOSIS — G8929 Other chronic pain: Secondary | ICD-10-CM | POA: Diagnosis not present

## 2020-01-30 DIAGNOSIS — G894 Chronic pain syndrome: Secondary | ICD-10-CM

## 2020-01-30 DIAGNOSIS — M79605 Pain in left leg: Secondary | ICD-10-CM | POA: Diagnosis not present

## 2020-01-30 DIAGNOSIS — M25552 Pain in left hip: Secondary | ICD-10-CM | POA: Diagnosis not present

## 2020-01-30 DIAGNOSIS — M25551 Pain in right hip: Secondary | ICD-10-CM | POA: Diagnosis not present

## 2020-01-30 DIAGNOSIS — M5459 Other low back pain: Secondary | ICD-10-CM | POA: Insufficient documentation

## 2020-01-30 MED ORDER — ROPIVACAINE HCL 2 MG/ML IJ SOLN
INTRAMUSCULAR | Status: AC
  Filled 2020-01-30: qty 10

## 2020-01-30 MED ORDER — LIDOCAINE HCL 2 % IJ SOLN
INTRAMUSCULAR | Status: AC
  Filled 2020-01-30: qty 20

## 2020-01-30 MED ORDER — TRIAMCINOLONE ACETONIDE 40 MG/ML IJ SUSP
40.0000 mg | Freq: Once | INTRAMUSCULAR | Status: AC
  Administered 2020-01-30: 40 mg

## 2020-01-30 MED ORDER — LIDOCAINE HCL 2 % IJ SOLN
20.0000 mL | Freq: Once | INTRAMUSCULAR | Status: AC
  Administered 2020-01-30: 400 mg

## 2020-01-30 MED ORDER — TRIAMCINOLONE ACETONIDE 40 MG/ML IJ SUSP
INTRAMUSCULAR | Status: AC
  Filled 2020-01-30: qty 1

## 2020-01-30 MED ORDER — ROPIVACAINE HCL 2 MG/ML IJ SOLN
9.0000 mL | Freq: Once | INTRAMUSCULAR | Status: AC
  Administered 2020-01-30: 9 mL

## 2020-01-30 NOTE — Progress Notes (Signed)
PROVIDER NOTE: Information contained herein reflects review and annotations entered in association with encounter. Interpretation of such information and data should be left to medically-trained personnel. Information provided to patient can be located elsewhere in the medical record under "Patient Instructions". Document created using STT-dictation technology, any transcriptional errors that may result from process are unintentional.    Patient: Richard Cook  Service Category: Procedure  Provider: Gaspar Cola, MD  DOB: 08-07-1955  DOS: 01/30/2020  Location: [facility]  MRN: [id number]  Setting: Ambulatory - outpatient  Referring Provider: Birdie Sons, MD  Type: Established Patient  Specialty: Interventional Pain Management  PCP: Birdie Sons, MD   Primary Reason for Visit: Interventional Pain Management Treatment. CC: Back Pain (lower)  Procedure:          Anesthesia, Analgesia, Anxiolysis:  Type: Left lumbar paravertebral Trigger Point Injection (1-2 muscle groups) #1  CPT: 20552 Primary Purpose: Diagnostic/therapeutic Region: Lower Lumbosacral Level: PSIS Target Area: Erector spinae muscle and quadratus lumborum muscle Trigger Point Approach: Percutaneous, ipsilateral approach. Laterality: Left-Sided Paravertebral  Type: Local Anesthesia Indication(s): Analgesia         Local Anesthetic: Lidocaine 1-2% Route: Infiltration (Anderson/IM) IV Access: Declined Sedation: Declined   Position: Prone   Indications: 1. Acute left-sided low back pain low back pain radiating to left lower extremity   2. Lumbar trigger point syndrome (Left)   3. Chronic low back pain (Bilateral) (R>L) w/o sciatica   4. Lumbar facet syndrome (Bilateral) (L>R)   5. Lumbar spondylosis (L4-5 and L5-S1 bulging disks)   6. Chronic hip pain (Bilateral) (L>R)   7. Chronic hip pain (Left)   8. Myofascial pain    Pain Score: Pre-procedure: 6 /10 Post-procedure: 0-No pain/10    Note: The patient comes into the clinic today indicating an acute episode of low back pain with pain being referred towards the left buttocks area and down through the lateral aspect of his left leg, stopping at the knee level.  Provocative exam today was positive for exact reproduction of the pain with palpation of a trigger point around the left PSIS area.  In addition, I had the patient performed a hyperextension and rotation maneuver which was also positive for pain in the left lower back worse when turning towards that side.  Patrick maneuver was positive bilaterally for hip arthralgia and positive on the left side for sacroiliac joint pain.  Today I will go ahead and do a trigger point injection of the left lumbar paravertebral muscles and see if that gives him complete relief of the pain.  If it does, then we will not need to do anything else.  However, if it does not help, then I will bring him back for a diagnostic left lumbar facet block under fluoroscopic guidance.  To further investigate the area I will go ahead and order an x-ray of the lumbar spine with flexion-extension views to check on the possibility of any instability since he also had acute low back pain with hyperextension.  I will also order x-rays of both of his hips since the Patrick maneuver proved to be positive bilaterally for hip arthralgia.  Pre-op Assessment:  Richard Cook is a 64 y.o. (year old), male patient, seen today for interventional treatment. He  has a past surgical history that includes Cholecystectomy open (1980's); Skin graft (Left, 1986); Mandible fracture surgery (1986); Vascular surgery (Left, 1986); Tibia fracture surgery (Right, 1986); left heart catheterization with coronary angiogram (N/A, 08/10/2011); percutaneous coronary stent  intervention (pci-s) (08/10/2011); left heart catheterization with coronary/graft angiogram (N/A, 10/17/2013); implantable cardioverter defibrillator implant (N/A, 10/18/2013); Split night  study (12/19/2015); Fracture surgery; Inguinal hernia repair (Bilateral, ~ 08/2016); Coronary angioplasty with stent (2013); Cardiac catheterization (2014); Ventricular ablation surgery (10/07/2016); Coronary artery bypass graft (2014); V TACH ABLATION (N/A, 10/07/2016); LEFT HEART CATH AND CORS/GRAFTS ANGIOGRAPHY (N/A, 11/17/2017); and V TACH ABLATION (N/A, 12/08/2017). Mr. Westberg has a current medication list which includes the following prescription(s): allopurinol, amiodarone, aspirin ec, bee pollen, bumetanide, clonazepam, furosemide, hydrocodone-acetaminophen, hydrocortisone, hydroxyzine, levothyroxine, linaclotide, metoprolol tartrate, mexiletine, mupirocin ointment, omega-3 fatty acids, ramelteon, and rosuvastatin. His primarily concern today is the Back Pain (lower)  Initial Vital Signs:  Pulse/HCG Rate: 99  Temp: 98.4 F (36.9 C) Resp: 16 BP: (!) 121/94 SpO2: 96 %  BMI: Estimated body mass index is 25.68 kg/m as calculated from the following:   Height as of this encounter: 5\' 10"  (1.778 m).   Weight as of this encounter: 179 lb (81.2 kg).  Risk Assessment: Allergies: Reviewed. He is allergic to codeine and colchicine.  Allergy Precautions: None required Coagulopathies: Reviewed. None identified.  Blood-thinner therapy: None at this time Active Infection(s): Reviewed. None identified. Mr. Bollman is afebrile  Site Confirmation: Mr. Liuzzi was asked to confirm the procedure and laterality before marking the site Procedure checklist: Completed Consent: Before the procedure and under the influence of no sedative(s), amnesic(s), or anxiolytics, the patient was informed of the treatment options, risks and possible complications. To fulfill our ethical and legal obligations, as recommended by the American Medical Association's Code of Ethics, I have informed the patient of my clinical impression; the nature and purpose of the treatment or procedure; the risks, benefits, and possible  complications of the intervention; the alternatives, including doing nothing; the risk(s) and benefit(s) of the alternative treatment(s) or procedure(s); and the risk(s) and benefit(s) of doing nothing. The patient was provided information about the general risks and possible complications associated with the procedure. These may include, but are not limited to: failure to achieve desired goals, infection, bleeding, organ or nerve damage, allergic reactions, paralysis, and death. In addition, the patient was informed of those risks and complications associated to the procedure, such as failure to decrease pain; infection; bleeding; organ or nerve damage with subsequent damage to sensory, motor, and/or autonomic systems, resulting in permanent pain, numbness, and/or weakness of one or several areas of the body; allergic reactions; (i.e.: anaphylactic reaction); and/or death. Furthermore, the patient was informed of those risks and complications associated with the medications. These include, but are not limited to: allergic reactions (i.e.: anaphylactic or anaphylactoid reaction(s)); adrenal axis suppression; blood sugar elevation that in diabetics may result in ketoacidosis or comma; water retention that in patients with history of congestive heart failure may result in shortness of breath, pulmonary edema, and decompensation with resultant heart failure; weight gain; swelling or edema; medication-induced neural toxicity; particulate matter embolism and blood vessel occlusion with resultant organ, and/or nervous system infarction; and/or aseptic necrosis of one or more joints. Finally, the patient was informed that Medicine is not an exact science; therefore, there is also the possibility of unforeseen or unpredictable risks and/or possible complications that may result in a catastrophic outcome. The patient indicated having understood very clearly. We have given the patient no guarantees and we have made no  promises. Enough time was given to the patient to ask questions, all of which were answered to the patient's satisfaction. Mr. Kaus has indicated that he wanted to continue with  the procedure. Attestation: I, the ordering provider, attest that I have discussed with the patient the benefits, risks, side-effects, alternatives, likelihood of achieving goals, and potential problems during recovery for the procedure that I have provided informed consent. Date  Time: 01/30/2020  2:34 PM  Pre-Procedure Preparation:  Monitoring: As per clinic protocol. Respiration, ETCO2, SpO2, BP, heart rate and rhythm monitor placed and checked for adequate function Safety Precautions: Patient was assessed for positional comfort and pressure points before starting the procedure. Time-out: I initiated and conducted the "Time-out" before starting the procedure, as per protocol. The patient was asked to participate by confirming the accuracy of the "Time Out" information. Verification of the correct person, site, and procedure were performed and confirmed by me, the nursing staff, and the patient. "Time-out" conducted as per Joint Commission's Universal Protocol (UP.01.01.01). Time: 1531  Description of Procedure:          Area Prepped: Entire             Region DuraPrep (Iodine Povacrylex [0.7% available iodine] and Isopropyl Alcohol, 74% w/w) Safety Precautions: Aspiration looking for blood return was conducted prior to all injections. At no point did we inject any substances, as a needle was being advanced. No attempts were made at seeking any paresthesias. Safe injection practices and needle disposal techniques used. Medications properly checked for expiration dates. SDV (single dose vial) medications used. Description of the Procedure: Protocol guidelines were followed. The patient was placed in position over the fluoroscopy table. The target area was identified and the area prepped in the usual manner. Skin & deeper  tissues infiltrated with local anesthetic. Appropriate amount of time allowed to pass for local anesthetics to take effect. The procedure needles were then advanced to the target area. Proper needle placement secured. Negative aspiration confirmed. Solution injected in intermittent fashion, asking for systemic symptoms every 0.5cc of injectate. The needles were then removed and the area cleansed, making sure to leave some of the prepping solution back to take advantage of its long term bactericidal properties.  Vitals:   01/30/20 1433  BP: (!) 121/94  Pulse: 99  Resp: 16  Temp: 98.4 F (36.9 C)  SpO2: 96%  Weight: 179 lb (81.2 kg)  Height: 5\' 10"  (1.778 m)    Start Time: 1531 hrs. End Time: 1534 hrs. Materials:  Needle(s) Type: Epidural needle Gauge: 25G Length: 1.5-in Medication(s): Please see orders for medications and dosing details.  Imaging Guidance:          Type of Imaging Technique: None used Indication(s): N/A Exposure Time: No patient exposure Contrast: None used. Fluoroscopic Guidance: N/A Ultrasound Guidance: N/A Interpretation: N/A  Antibiotic Prophylaxis:   Anti-infectives (From admission, onward)   None     Indication(s): None identified  Post-operative Assessment:  Post-procedure Vital Signs:  Pulse/HCG Rate: 99  Temp: 98.4 F (36.9 C) Resp: 16 BP: (!) 121/94 SpO2: 96 %  EBL: None  Complications: No immediate post-treatment complications observed by team, or reported by patient.  Note: The patient tolerated the entire procedure well. A repeat set of vitals were taken after the procedure and the patient was kept under observation following institutional policy, for this type of procedure. Post-procedural neurological assessment was performed, showing return to baseline, prior to discharge. The patient was provided with post-procedure discharge instructions, including a section on how to identify potential problems. Should any problems arise concerning  this procedure, the patient was given instructions to immediately contact us, at any time, without hesitation. In any  case, we plan to contact the patient by telephone for a follow-up status report regarding this interventional procedure.  Comments:  No additional relevant information.  Plan of Care  Orders:  Orders Placed This Encounter  Procedures  . TRIGGER POINT INJECTION    Scheduling Instructions:     Area: Lower Back     Side: Left     Sedation: No Sedation.     Timeframe: Today    Order Specific Question:   Where will this procedure be performed?    Answer:   [facility]  . LUMBAR FACET(MEDIAL BRANCH NERVE BLOCK) MBNB    Standing Status:   Future    Standing Expiration Date:   02/29/2020    Scheduling Instructions:     Procedure: Lumbar facet block (AKA.: Lumbosacral medial branch nerve block)     Side: Left-sided     Level: L3-4, L4-5, & L5-S1 Facets (L2, L3, L4, L5, & S1 Medial Branch Nerves)     Sedation: Patient's choice.     Timeframe: in about 7 days from now.    Order Specific Question:   Where will this procedure be performed?    Answer:   [facility]  . DG HIP UNILAT W OR W/O PELVIS 2-3 VIEWS LEFT    Standing Status:   Future    Standing Expiration Date:   01/29/2021    Scheduling Instructions:     Please describe any evidence of DJD, such as joint narrowing, asymmetry, cysts, or any anomalies in bone density, production, or erosion.    Order Specific Question:   Reason for Exam (SYMPTOM  OR DIAGNOSIS REQUIRED)    Answer:   Left hip pain/arthralgia    Order Specific Question:   Preferred imaging location?    Answer:   [facility]    Order Specific Question:   Call Results- Best Contact Number?    Answer:   [phone] (Pain Clinic facility) (Dr. Dossie Arbour)  . DG Lumbar Spine Complete W/Bend    Patient presents with axial pain with possible radicular component.  In addition to any acute findings, please report on:  1. Facet  (Zygapophyseal) joint DJD (Hypertrophy, space narrowing, subchondral sclerosis, and/or osteophyte formation) 2. DDD and/or IVDD (Loss of disc height, desiccation or "Black disc disease") 3. Pars defects 4. Spondylolisthesis, spondylosis, and/or spondyloarthropathies (include Degree/Grade of displacement in mm) 5. Vertebral body Fractures, including age (old, new/acute) 56. Modic Type Changes 7. Demineralization 8. Bone pathology 9. Central, Lateral Recess, and/or Foraminal Stenosis (include AP diameter of stenosis in mm) 10. Surgical changes (hardware type, status, and presence of fibrosis)  NOTE: Please specify level(s) and laterality. If applicable: Please indicate ROM and/or evidence of instability (>17mm displacement between flexion and extension views)    Standing Status:   Future    Standing Expiration Date:   05/01/2020    Order Specific Question:   Reason for Exam (SYMPTOM  OR DIAGNOSIS REQUIRED)    Answer:   Low back pain    Order Specific Question:   Preferred imaging location?    Answer:   [facility]    Order Specific Question:   Call Results- Best Contact Number?    Answer:   [phone] ([facility])    Order Specific Question:   Radiology Contrast Protocol - do NOT remove file path    Answer:   [email or web address]  . DG HIP UNILAT W OR W/O PELVIS 2-3 VIEWS RIGHT    Standing Status:   Future  Standing Expiration Date:   01/29/2021    Scheduling Instructions:     Please describe any evidence of DJD, such as joint narrowing, asymmetry, cysts, or any anomalies in bone density, production, or erosion.    Order Specific Question:   Reason for Exam (SYMPTOM  OR DIAGNOSIS REQUIRED)    Answer:   Right hip pain/arthralgia    Order Specific Question:   Preferred imaging location?    Answer:   [facility]    Order Specific Question:   Call Results- Best Contact Number?    Answer:   [phone] (Pain Clinic facility) (Dr.  Dossie Arbour)  . Informed Consent Details: Physician/Practitioner Attestation; Transcribe to consent form and obtain patient signature    Provider Attestation: I, [place] Dossie Arbour, MD, (Pain Management Specialist), the physician/practitioner, attest that I have discussed with the patient the benefits, risks, side effects, alternatives, likelihood of achieving goals and potential problems during recovery for the procedure that I have provided informed consent.    Scheduling Instructions:     Procedure: Myoneural Block (Trigger Point injection)     Indications: Musculoskeletal pain/myofascial pain secondary to trigger point     Note: Always confirm laterality of pain with Mr. Guerrant, before procedure.     Transcribe to consent form and obtain patient signature.  . Provide equipment / supplies at bedside    Equipment required: Single use, disposable, "Block Tray"    Standing Status:   Standing    Number of Occurrences:   1    Order Specific Question:   Specify    Answer:   Block Tray   Chronic Opioid Analgesic:  No opioid analgesics prescribed by our practice.   Medications ordered for procedure: Meds ordered this encounter  Medications  . triamcinolone acetonide (KENALOG-40) injection 40 mg  . ropivacaine (PF) 2 mg/mL (0.2%) (NAROPIN) injection 9 mL  . lidocaine (XYLOCAINE) 2 % (with pres) injection 400 mg   Medications administered: We administered triamcinolone acetonide, ropivacaine (PF) 2 mg/mL (0.2%), and lidocaine.  See the medical record for exact dosing, route, and time of administration.  Follow-up plan:   Return in about 1 week (around 02/06/2020) for Procedure (w/ sedation): (L) L-FCT BLK #1.       Interventional Therapies:  Considering:   Diagnostic left-sided lumbar facet block #1    PRN Procedures:   Palliative/therapeutic right T11-12 TESI #2  Palliative right L4-5 LESI #2  Palliative left L4-5 LESI #2  Palliative TPI/MNB     Recent Visits No visits were found  meeting these conditions. Showing recent visits within past 90 days and meeting all other requirements Today's Visits Date Type Provider Dept  01/30/20 Office Visit Milinda Pointer, MD [facility]  Showing today's visits and meeting all other requirements Future Appointments Date Type Provider Dept  02/06/20 Appointment Milinda Pointer, MD [facility]  Showing future appointments within next 90 days and meeting all other requirements  Disposition: Discharge home  Discharge (Date  Time): 01/30/2020; 1540 hrs.   Primary Care Physician: Birdie Sons, MD Location: [facility] Note by: Gaspar Cola, MD Date: 01/30/2020; Time: 3:43 PM  Disclaimer:  Medicine is not an Chief Strategy Officer. The only guarantee in medicine is that nothing is guaranteed. It is important to note that the decision to proceed with this intervention was based on the information collected from the patient. The Data and conclusions were drawn from the patient's questionnaire, the interview, and the physical examination. Because the information  was provided in large part by the patient, it cannot be guaranteed that it has not been purposely or unconsciously manipulated. Every effort has been made to obtain as much relevant data as possible for this evaluation. It is important to note that the conclusions that lead to this procedure are derived in large part from the available data. Always take into account that the treatment will also be dependent on availability of resources and existing treatment guidelines, considered by other Pain Management Practitioners as being common knowledge and practice, at the time of the intervention. For Medico-Legal purposes, it is also important to point out that variation in procedural techniques and pharmacological choices are the acceptable norm. The indications, contraindications, technique, and results of the above procedure should only  be interpreted and judged by a Board-Certified Interventional Pain Specialist with extensive familiarity and expertise in the same exact procedure and technique.

## 2020-01-30 NOTE — Patient Instructions (Addendum)
____________________________________________________________________________________________  Post-Procedure Discharge Instructions  Instructions:  Apply ice:   Purpose: This will minimize any swelling and discomfort after procedure.   When: Day of procedure, as soon as you get home.  How: Fill a plastic sandwich bag with crushed ice. Cover it with a small towel and apply to injection site.  How long: (15 min on, 15 min off) Apply for 15 minutes then remove x 15 minutes.  Repeat sequence on day of procedure, until you go to bed.  Apply heat:   Purpose: To treat any soreness and discomfort from the procedure.  When: Starting the next day after the procedure.  How: Apply heat to procedure site starting the day following the procedure.  How long: May continue to repeat daily, until discomfort goes away.  Food intake: Start with clear liquids (like water) and advance to regular food, as tolerated.   Physical activities: Keep activities to a minimum for the first 8 hours after the procedure. After that, then as tolerated.  Driving: If you have received any sedation, be responsible and do not drive. You are not allowed to drive for 24 hours after having sedation.  Blood thinner: (Applies only to those taking blood thinners) You may restart your blood thinner 6 hours after your procedure.  Insulin: (Applies only to Diabetic patients taking insulin) As soon as you can eat, you may resume your normal dosing schedule.  Infection prevention: Keep procedure site clean and dry. Shower daily and clean area with soap and water.  Post-procedure Pain Diary: Extremely important that this be done correctly and accurately. Recorded information will be used to determine the next step in treatment. For the purpose of accuracy, follow these rules:  Evaluate only the area treated. Do not report or include pain from an untreated area. For the purpose of this evaluation, ignore all other areas of pain,  except for the treated area.  After your procedure, avoid taking a long nap and attempting to complete the pain diary after you wake up. Instead, set your alarm clock to go off every hour, on the hour, for the initial 8 hours after the procedure. Document the duration of the numbing medicine, and the relief you are getting from it.  Do not go to sleep and attempt to complete it later. It will not be accurate. If you received sedation, it is likely that you were given a medication that may cause amnesia. Because of this, completing the diary at a later time may cause the information to be inaccurate. This information is needed to plan your care.  Follow-up appointment: Keep your post-procedure follow-up evaluation appointment after the procedure (usually 2 weeks for most procedures, 6 weeks for radiofrequencies). DO NOT FORGET to bring you pain diary with you.   Expect: (What should I expect to see with my procedure?)  From numbing medicine (AKA: Local Anesthetics): Numbness or decrease in pain. You may also experience some weakness, which if present, could last for the duration of the local anesthetic.  Onset: Full effect within 15 minutes of injected.  Duration: It will depend on the type of local anesthetic used. On the average, 1 to 8 hours.   From steroids (Applies only if steroids were used): Decrease in swelling or inflammation. Once inflammation is improved, relief of the pain will follow.  Onset of benefits: Depends on the amount of swelling present. The more swelling, the longer it will take for the benefits to be seen. In some cases, up to 10 days.    Duration: Steroids will stay in the system x 2 weeks. Duration of benefits will depend on multiple posibilities including persistent irritating factors.  Side-effects: If present, they may typically last 2 weeks (the duration of the steroids).  Frequent: Cramps (if they occur, drink Gatorade and take over-the-counter Magnesium 450-500 mg  once to twice a day); water retention with temporary weight gain; increases in blood sugar; decreased immune system response; increased appetite.  Occasional: Facial flushing (red, warm cheeks); mood swings; menstrual changes.  Uncommon: Long-term decrease or suppression of natural hormones; bone thinning. (These are more common with higher doses or more frequent use. This is why we prefer that our patients avoid having any injection therapies in other practices.)   Very Rare: Severe mood changes; psychosis; aseptic necrosis.  From procedure: Some discomfort is to be expected once the numbing medicine wears off. This should be minimal if ice and heat are applied as instructed.  Call if: (When should I call?)  You experience numbness and weakness that gets worse with time, as opposed to wearing off.  New onset bowel or bladder incontinence. (Applies only to procedures done in the spine)  Emergency Numbers:  Durning business hours (Monday - Thursday, 8:00 AM - 4:00 PM) (Friday, 9:00 AM - 12:00 Noon): [phone]  After hours: [phone]  NOTE: If you are having a problem and are unable connect with, or to talk to a provider, then go to your nearest urgent care or emergency department. If the problem is serious and urgent, please call 911. ____________________________________________________________________________________________  ____________________________________________________________________________________________  Preparing for your procedure (without sedation)  Procedure appointments are limited to planned procedures: . No Prescription Refills. . No disability issues will be discussed. . No medication changes will be discussed.  Instructions: . Oral Intake: Do not eat or drink anything for at least 6 hours prior to your procedure. (Exception: Blood Pressure Medication. See below.) . Transportation: Unless otherwise stated by your physician, you may drive yourself  after the procedure. . Blood Pressure Medicine: Do not forget to take your blood pressure medicine with a sip of water the morning of the procedure. If your Diastolic (lower reading)is above 100 mmHg, elective cases will be cancelled/rescheduled. . Blood thinners: These will need to be stopped for procedures. Notify our staff if you are taking any blood thinners. Depending on which one you take, there will be specific instructions on how and when to stop it. . Diabetics on insulin: Notify the staff so that you can be scheduled 1st case in the morning. If your diabetes requires high dose insulin, take only  of your normal insulin dose the morning of the procedure and notify the staff that you have done so. . Preventing infections: Shower with an antibacterial soap the morning of your procedure.  . Build-up your immune system: Take 1000 mg of Vitamin C with every meal (3 times a day) the day prior to your procedure. Marland Kitchen Antibiotics: Inform the staff if you have a condition or reason that requires you to take antibiotics before dental procedures. . Pregnancy: If you are pregnant, call and cancel the procedure. . Sickness: If you have a cold, fever, or any active infections, call and cancel the procedure. . Arrival: You must be in the facility at least 30 minutes prior to your scheduled procedure. . Children: Do not bring any children with you. . Dress appropriately: Bring dark clothing that you would not mind if they get stained. . Valuables: Do not bring any jewelry or  valuables.  Reasons to call and reschedule or cancel your procedure: (Following these recommendations will minimize the risk of a serious complication.) . Surgeries: Avoid having procedures within 2 weeks of any surgery. (Avoid for 2 weeks before or after any surgery). . Flu Shots: Avoid having procedures within 2 weeks of a flu shots or . (Avoid for 2 weeks before or after immunizations). . Barium: Avoid having a procedure within 7-10  days after having had a radiological study involving the use of radiological contrast. (Myelograms, Barium swallow or enema study). . Heart attacks: Avoid any elective procedures or surgeries for the initial 6 months after a "Myocardial Infarction" (Heart Attack). . Blood thinners: It is imperative that you stop these medications before procedures. Let us know if you if you take any blood thinner.  . Infection: Avoid procedures during or within two weeks of an infection (including chest colds or gastrointestinal problems). Symptoms associated with infections include: Localized redness, fever, chills, night sweats or profuse sweating, burning sensation when voiding, cough, congestion, stuffiness, runny nose, sore throat, diarrhea, nausea, vomiting, cold or Flu symptoms, recent or current infections. It is specially important if the infection is over the area that we intend to treat. Marland Kitchen Heart and lung problems: Symptoms that may suggest an active cardiopulmonary problem include: cough, chest pain, breathing difficulties or shortness of breath, dizziness, ankle swelling, uncontrolled high or unusually low blood pressure, and/or palpitations. If you are experiencing any of these symptoms, cancel your procedure and contact your primary care physician for an evaluation.  Remember:  Regular Business hours are:  Monday to Thursday 8:00 AM to 4:00 PM  Provider's Schedule: Milinda Pointer, MD:  Procedure days: Tuesday and Thursday 7:30 AM to 4:00 PM  Gillis Santa, MD:  Procedure days: Monday and Wednesday 7:30 AM to 4:00 PM ____________________________________________________________________________________________  ____________________________________________________________________________________________  Pain Prevention Technique  Definition:   A technique used to minimize the effects of an activity known to cause inflammation or swelling, which in turn leads to an increase in pain.  Purpose: To  prevent swelling from occurring. It is based on the fact that it is easier to prevent swelling from happening than it is to get rid of it, once it occurs.  Contraindications: 1. Anyone with allergy or hypersensitivity to the recommended medications. 2. Anyone taking anticoagulants (Blood Thinners) (e.g., Coumadin, Warfarin, Plavix, etc.). 3. Patients in Renal Failure.  Technique: Before you undertake an activity known to cause pain, or a flare-up of your chronic pain, and before you experience any pain, do the following:  1. On a full stomach, take 4 (four) over the counter Ibuprofens 200mg  tablets (Motrin), for a total of 800 mg. 2. In addition, take over the counter Magnesium 400 to 500 mg, before doing the activity.  3. Six (6) hours later, again on a full stomach, repeat the Ibuprofen. 4. That night, take a warm shower and stretch under the running warm water.  This technique may be sufficient to abort the pain and discomfort before it happens. Keep in mind that it takes a lot less medication to prevent swelling than it takes to eliminate it once it occurs.  ____________________________________________________________________________________________    Facet Joint Block The facet joints connect the bones of the spine (vertebrae). They make it possible for you to bend, twist, and make other movements with your spine. They also keep you from bending too far, twisting too far, and making other extreme movements. A facet joint block is a procedure in which a numbing  medicine (anesthetic) is injected into a facet joint. In many cases, an anti-inflammatory medicine (steroid) is also injected. A facet joint block may be done:  To diagnose neck or back pain. If the pain gets better after a facet joint block, it means the pain is probably coming from the facet joint. If the pain does not get better, it means the pain is probably not coming from the facet joint.  To relieve neck or back pain that  is caused by an inflamed facet joint. A facet joint block is only done to relieve pain if the pain does not improve with other methods, such as medicine, exercise programs, and physical therapy. Tell a health care provider about:  Any allergies you have.  All medicines you are taking, including vitamins, herbs, eye drops, creams, and over-the-counter medicines.  Any problems you or family members have had with anesthetic medicines.  Any blood disorders you have.  Any surgeries you have had.  Any medical conditions you have or have had.  Whether you are pregnant or may be pregnant. What are the risks? Generally, this is a safe procedure. However, problems may occur, including:  Bleeding.  Injury to a nerve near the injection site.  Pain at the injection site.  Weakness or numbness in areas controlled by nerves near the injection site.  Infection.  Temporary fluid retention.  Allergic reactions to medicines or dyes.  Injury to other structures or organs near the injection site. What happens before the procedure? Medicines Ask your health care provider about:  Changing or stopping your regular medicines. This is especially important if you are taking diabetes medicines or blood thinners.  Taking medicines such as aspirin and ibuprofen. These medicines can thin your blood. Do not take these medicines unless your health care provider tells you to take them.  Taking over-the-counter medicines, vitamins, herbs, and supplements. Eating and drinking Follow instructions from your health care provider about eating and drinking, which may include:  8 hours before the procedure - stop eating heavy meals or foods, such as meat, fried foods, or fatty foods.  6 hours before the procedure - stop eating light meals or foods, such as toast or cereal.  6 hours before the procedure - stop drinking milk or drinks that contain milk.  2 hours before the procedure - stop drinking clear  liquids. Staying hydrated Follow instructions from your health care provider about hydration, which may include:  Up to 2 hours before the procedure - you may continue to drink clear liquids, such as water, clear fruit juice, black coffee, and plain tea. General instructions  Do not use any products that contain nicotine or tobacco for at least 4-6 weeks before the procedure. These products include cigarettes, e-cigarettes, and chewing tobacco. If you need help quitting, ask your health care provider.  Plan to have someone take you home from the hospital or clinic.  Ask your health care provider: ? How your surgery site will be marked. ? What steps will be taken to help prevent infection. These may include:  Removing hair at the surgery site.  Washing skin with a germ-killing soap.  Receiving antibiotic medicine. What happens during the procedure?   You will put on a hospital gown.  You will lie on your stomach on an X-ray table. You may be asked to lie in a different position if an injection will be made in your neck.  Machines will be used to monitor your oxygen levels, heart rate, and blood  pressure.  Your skin will be cleaned.  If an injection will be made in your neck, an IV will be inserted into one of your veins. Fluids and medicine will flow directly into your body through the IV.  A numbing medicine (local anesthetic) will be applied to your skin. Your skin may sting or burn for a moment.  A video X-ray machine (fluoroscopy) will be used to find the joint. In some cases, a CT scan may be used.  A contrast dye may be injected into the facet joint area to help find the joint.  When the joint is located, an anesthetic will be injected into the joint through the needle.  Your health care provider will ask you whether you feel pain relief. ? If you feel relief, a steroid may be injected to provide pain relief for a longer period of time. ? If you do not feel relief or  feel only partial relief, additional injections of an anesthetic may be made in other facet joints.  The needle will be removed.  Your skin will be cleaned.  A bandage (dressing) will be applied over each injection site. The procedure may vary among health care providers and hospitals. What happens after the procedure?  Your blood pressure, heart rate, breathing rate, and blood oxygen level will be monitored until you leave the hospital or clinic.  You will lie down and rest for a period of time. Summary  A facet joint block is a procedure in which a numbing medicine (anesthetic) is injected into a facet joint. An anti-inflammatory medicine (stereoid) may also be injected.  Follow instructions from your health care provider about medicines and eating and drinking before the procedure.  Do not use any products that contain nicotine or tobacco for at least 4-6 weeks before the procedure.  You will lie on your stomach for the procedure, but you may be asked to lie in a different position if an injection will be made in your neck.  When the joint is located, an anesthetic will be injected into the joint through the needle. This information is not intended to replace advice given to you by your health care provider. Make sure you discuss any questions you have with your health care provider. Document Revised: 11/10/2018 Document Reviewed: 06/24/2018 Elsevier Patient Education  [employer].

## 2020-02-01 ENCOUNTER — Telehealth: Payer: Self-pay

## 2020-02-02 ENCOUNTER — Other Ambulatory Visit: Payer: Self-pay | Admitting: Family Medicine

## 2020-02-02 NOTE — Telephone Encounter (Signed)
He called questioning appt. All questions answered

## 2020-02-02 NOTE — Telephone Encounter (Signed)
Pt has been called several times.Leland at [facility] [employer] they will call again today to try to schedule. I left message for pt to return my call to advise.Phone: [phone]

## 2020-02-02 NOTE — Telephone Encounter (Signed)
° °  Notes to clinic: Patient requesting a 90 day Review for change   Requested Prescriptions  Pending Prescriptions Disp Refills   hydrOXYzine (ATARAX/VISTARIL) 25 MG tablet [Pharmacy Med Name: HYDROXYZINE HCL 25 MG TABLET] 180 tablet 1    Sig: TAKE 1-2 TABLETS BY MOUTH AT BEDTIME AS NEEDED.      Ear, Nose, and Throat:  Antihistamines Passed - 02/02/2020  8:32 AM      Passed - Valid encounter within last 12 months    Recent Outpatient Visits           1 week ago Seborrhea capitis   [facility] Birdie Sons, MD   1 month ago CAD- PCI to RCA 08/10/11, CABG in [place] 5/14   [place], Kirstie Peri, MD   6 months ago Acute non-recurrent sinusitis, unspecified location   [facility] Fl[place], Kelby Aline, FNP   1 year ago Edema, unspecified type   [facility] Birdie Sons, MD   1 year ago 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, stage 5 [facility])   [place], Donald E, MD       Future Appointments             In 1 month Fisher, Kirstie Peri, MD [facility], [place]

## 2020-02-06 ENCOUNTER — Encounter: Payer: Self-pay | Admitting: Pain Medicine

## 2020-02-06 ENCOUNTER — Ambulatory Visit (HOSPITAL_BASED_OUTPATIENT_CLINIC_OR_DEPARTMENT_OTHER): Payer: PPO | Admitting: Pain Medicine

## 2020-02-06 ENCOUNTER — Ambulatory Visit
Admission: RE | Admit: 2020-02-06 | Discharge: 2020-02-06 | Disposition: A | Discharge status: Home / Self Care | Payer: PPO | Source: Ambulatory Visit | Attending: Pain Medicine | Admitting: Pain Medicine

## 2020-02-06 ENCOUNTER — Other Ambulatory Visit: Payer: Self-pay

## 2020-02-06 VITALS — BP 135/87 | HR 41 | Temp 98.1°F | Resp 11 | Ht 70.0 in | Wt 179.0 lb

## 2020-02-06 DIAGNOSIS — M545 Low back pain: Secondary | ICD-10-CM | POA: Diagnosis not present

## 2020-02-06 DIAGNOSIS — M5137 Other intervertebral disc degeneration, lumbosacral region: Secondary | ICD-10-CM | POA: Diagnosis not present

## 2020-02-06 DIAGNOSIS — G8929 Other chronic pain: Secondary | ICD-10-CM | POA: Diagnosis not present

## 2020-02-06 DIAGNOSIS — M47816 Spondylosis without myelopathy or radiculopathy, lumbar region: Secondary | ICD-10-CM

## 2020-02-06 DIAGNOSIS — M47817 Spondylosis without myelopathy or radiculopathy, lumbosacral region: Secondary | ICD-10-CM | POA: Insufficient documentation

## 2020-02-06 MED ORDER — TRIAMCINOLONE ACETONIDE 40 MG/ML IJ SUSP
40.0000 mg | Freq: Once | INTRAMUSCULAR | Status: AC
  Administered 2020-02-06: 40 mg
  Filled 2020-02-06: qty 1

## 2020-02-06 MED ORDER — LACTATED RINGERS IV SOLN: 1000.0000 mL | Freq: Once | INTRAVENOUS | Status: DC

## 2020-02-06 MED ORDER — MIDAZOLAM HCL 5 MG/5ML IJ SOLN: 1.0000 mg | INTRAMUSCULAR | Status: DC | PRN

## 2020-02-06 MED ORDER — FENTANYL CITRATE (PF) 100 MCG/2ML IJ SOLN: 25.0000 ug | INTRAMUSCULAR | Status: DC | PRN

## 2020-02-06 MED ORDER — LIDOCAINE HCL 2 % IJ SOLN
20.0000 mL | Freq: Once | INTRAMUSCULAR | Status: AC
  Administered 2020-02-06: 400 mg
  Filled 2020-02-06: qty 10

## 2020-02-06 MED ORDER — ROPIVACAINE HCL 2 MG/ML IJ SOLN
9.0000 mL | Freq: Once | INTRAMUSCULAR | Status: AC
  Administered 2020-02-06: 9 mL via PERINEURAL
  Filled 2020-02-06: qty 10

## 2020-02-06 NOTE — Patient Instructions (Signed)

## 2020-02-06 NOTE — Progress Notes (Signed)
Safety precautions to be maintained throughout the outpatient stay will include: orient to surroundings, keep bed in low position, maintain call bell within reach at all times, provide assistance with transfer out of bed and ambulation.  

## 2020-02-06 NOTE — Progress Notes (Signed)
PROVIDER NOTE: Information contained herein reflects review and annotations entered in association with encounter. Interpretation of such information and data should be left to medically-trained personnel. Information provided to patient can be located elsewhere in the medical record under "Patient Instructions". Document created using STT-dictation technology, any transcriptional errors that may result from process are unintentional.    Patient: Richard Cook  Service Category: Procedure  Provider: Gaspar Cola, MD  DOB: 1955-11-21  DOS: 02/06/2020  Location: [facility]  MRN: [id number]  Setting: Ambulatory - outpatient  Referring Provider: Birdie Sons, MD  Type: Established Patient  Specialty: Interventional Pain Management  PCP: Birdie Sons, MD   Primary Reason for Visit: Interventional Pain Management Treatment. CC: Back Pain (left, lower)  Procedure:          Anesthesia, Analgesia, Anxiolysis:  Type: Lumbar Facet, Medial Branch Block(s) #1  Primary Purpose: Diagnostic Region: Posterolateral Lumbosacral Spine Level: L2, L3, L4, L5, & S1 Medial Branch Level(s). Injecting these levels blocks the L3-4, L4-5, and L5-S1 lumbar facet joints. Laterality: Right  Type: Local Anesthesia Indication(s): Analgesia         Route: Infiltration ([place]/IM) IV Access: Declined Sedation: Declined  Local Anesthetic: Lidocaine 1-2%  Position: Prone   Indications: 1. Spondylosis without myelopathy or radiculopathy, lumbosacral region   2. Lumbar facet syndrome (Bilateral) (L>R)   3. DDD (degenerative disc disease), lumbosacral   4. Chronic low back pain (Bilateral) (R>L) w/o sciatica    Pain Score: Pre-procedure: 2 /10 Post-procedure: 0-No pain/10   The patient returns to the clinic today for the treatment of his low back pain.  On his last visit to the clinics on 01/30/2020 he had a left-sided trigger point injection done which apparently did provide him with some  relief of the pain on the left side, but now his pain is on the right.  He refers that he has been having a lot of problems with the muscle tightening up.  Today's physical exam was positive for lumbar facet and sacroiliac joint pain.  Today we have not scheduled to come in for a diagnostic lumbar facet block on the left side.  However, upon arrival to the clinic he refers that the trigger point injection seems to have helped and his primary pain now is on the right side.  Post-Procedure Evaluation  Procedure: (01/30/2020) diagnostic/therapeutic left erector spinae muscle and quadratus lumborum muscle trigger point injection #1, no fluoroscopy or IV sedation Pre-procedure pain level: 6/10 Post-procedure: 0/10 (100% relief)  Sedation: None.  Effectiveness during initial hour after procedure(Ultra-Short Term Relief):   100%.  Local anesthetic used: Long-acting (4-6 hours) Effectiveness: Defined as any analgesic benefit obtained secondary to the administration of local anesthetics. This carries significant diagnostic value as to the etiological location, or anatomical origin, of the pain. Duration of benefit is expected to coincide with the duration of the local anesthetic used.  Effectiveness during initial 4-6 hours after procedure(Short-Term Relief):   100%.  Long-term benefit: Defined as any relief past the pharmacologic duration of the local anesthetics.  Effectiveness past the initial 6 hours after procedure(Long-Term Relief):   85-90%.  Current benefits: Defined as benefit that persist at this time.   Analgesia:  90-100% better Function: Richard Cook reports improvement in function ROM: Richard Cook reports improvement in ROM  Pre-op Assessment:  Richard Cook is a 64 y.o. (year old), male patient, seen today for interventional treatment. He  has a past surgical history that includes Cholecystectomy open (1980's);  Skin graft (Left, 1986); Mandible fracture surgery (1986); Vascular surgery (Left,  1986); Tibia fracture surgery (Right, 1986); left heart catheterization with coronary angiogram (N/A, 08/10/2011); percutaneous coronary stent intervention (pci-s) (08/10/2011); left heart catheterization with coronary/graft angiogram (N/A, 10/17/2013); implantable cardioverter defibrillator implant (N/A, 10/18/2013); Split night study (12/19/2015); Fracture surgery; Inguinal hernia repair (Bilateral, ~ 08/2016); Coronary angioplasty with stent (2013); Cardiac catheterization (2014); Ventricular ablation surgery (10/07/2016); Coronary artery bypass graft (2014); V TACH ABLATION (N/A, 10/07/2016); LEFT HEART CATH AND CORS/GRAFTS ANGIOGRAPHY (N/A, 11/17/2017); and V TACH ABLATION (N/A, 12/08/2017). Richard Cook has a current medication list which includes the following prescription(s): allopurinol, amiodarone, aspirin ec, bee pollen, bumetanide, clonazepam, furosemide, hydrocodone-acetaminophen, hydrocortisone, hydroxyzine, levothyroxine, linaclotide, metoprolol tartrate, mexiletine, mupirocin ointment, omega-3 fatty acids, ramelteon, and rosuvastatin. His primarily concern today is the Back Pain (left, lower)  Initial Vital Signs:  Pulse/HCG Rate: (!) 41ECG Heart Rate: (!) 45 Temp: 98.1 F (36.7 C) Resp: 16 BP: 132/84 SpO2: 98 %  BMI: Estimated body mass index is 25.68 kg/m as calculated from the following:   Height as of this encounter: 5\' 10"  (1.778 m).   Weight as of this encounter: 179 lb (81.2 kg).  Risk Assessment: Allergies: Reviewed. He is allergic to codeine and colchicine.  Allergy Precautions: None required Coagulopathies: Reviewed. None identified.  Blood-thinner therapy: None at this time Active Infection(s): Reviewed. None identified. Richard Cook is afebrile  Site Confirmation: Richard Cook was asked to confirm the procedure and laterality before marking the site Procedure checklist: Completed Consent: Before the procedure and under the influence of no sedative(s), amnesic(s), or anxiolytics,  the patient was informed of the treatment options, risks and possible complications. To fulfill our ethical and legal obligations, as recommended by the American Medical Association's Code of Ethics, I have informed the patient of my clinical impression; the nature and purpose of the treatment or procedure; the risks, benefits, and possible complications of the intervention; the alternatives, including doing nothing; the risk(s) and benefit(s) of the alternative treatment(s) or procedure(s); and the risk(s) and benefit(s) of doing nothing. The patient was provided information about the general risks and possible complications associated with the procedure. These may include, but are not limited to: failure to achieve desired goals, infection, bleeding, organ or nerve damage, allergic reactions, paralysis, and death. In addition, the patient was informed of those risks and complications associated to Spine-related procedures, such as failure to decrease pain; infection (i.e.: Meningitis, epidural or intraspinal abscess); bleeding (i.e.: epidural hematoma, subarachnoid hemorrhage, or any other type of intraspinal or peri-dural bleeding); organ or nerve damage (i.e.: Any type of peripheral nerve, nerve root, or spinal cord injury) with subsequent damage to sensory, motor, and/or autonomic systems, resulting in permanent pain, numbness, and/or weakness of one or several areas of the body; allergic reactions; (i.e.: anaphylactic reaction); and/or death. Furthermore, the patient was informed of those risks and complications associated with the medications. These include, but are not limited to: allergic reactions (i.e.: anaphylactic or anaphylactoid reaction(s)); adrenal axis suppression; blood sugar elevation that in diabetics may result in ketoacidosis or comma; water retention that in patients with history of congestive heart failure may result in shortness of breath, pulmonary edema, and decompensation with  resultant heart failure; weight gain; swelling or edema; medication-induced neural toxicity; particulate matter embolism and blood vessel occlusion with resultant organ, and/or nervous system infarction; and/or aseptic necrosis of one or more joints. Finally, the patient was informed that Medicine is not an exact science; therefore, there is also the possibility  of unforeseen or unpredictable risks and/or possible complications that may result in a catastrophic outcome. The patient indicated having understood very clearly. We have given the patient no guarantees and we have made no promises. Enough time was given to the patient to ask questions, all of which were answered to the patient's satisfaction. Mr. Vaeth has indicated that he wanted to continue with the procedure. Attestation: I, the ordering provider, attest that I have discussed with the patient the benefits, risks, side-effects, alternatives, likelihood of achieving goals, and potential problems during recovery for the procedure that I have provided informed consent. Date  Time: 02/06/2020 12:46 PM  Pre-Procedure Preparation:  Monitoring: As per clinic protocol. Respiration, ETCO2, SpO2, BP, heart rate and rhythm monitor placed and checked for adequate function Safety Precautions: Patient was assessed for positional comfort and pressure points before starting the procedure. Time-out: I initiated and conducted the "Time-out" before starting the procedure, as per protocol. The patient was asked to participate by confirming the accuracy of the "Time Out" information. Verification of the correct person, site, and procedure were performed and confirmed by me, the nursing staff, and the patient. "Time-out" conducted as per Joint Commission's Universal Protocol (UP.01.01.01). Time: 1343  Description of Procedure:          Laterality: Right Levels:  L2, L3, L4, L5, & S1 Medial Branch Level(s) Area Prepped: Posterior Lumbosacral Region DuraPrep  (Iodine Povacrylex [0.7% available iodine] and Isopropyl Alcohol, 74% w/w) Safety Precautions: Aspiration looking for blood return was conducted prior to all injections. At no point did we inject any substances, as a needle was being advanced. Before injecting, the patient was told to immediately notify me if he was experiencing any new onset of "ringing in the ears, or metallic taste in the mouth". No attempts were made at seeking any paresthesias. Safe injection practices and needle disposal techniques used. Medications properly checked for expiration dates. SDV (single dose vial) medications used. After the completion of the procedure, all disposable equipment used was discarded in the proper designated medical waste containers. Local Anesthesia: Protocol guidelines were followed. The patient was positioned over the fluoroscopy table. The area was prepped in the usual manner. The time-out was completed. The target area was identified using fluoroscopy. A 12-in long, straight, sterile hemostat was used with fluoroscopic guidance to locate the targets for each level blocked. Once located, the skin was marked with an approved surgical skin marker. Once all sites were marked, the skin (epidermis, dermis, and hypodermis), as well as deeper tissues (fat, connective tissue and muscle) were infiltrated with a small amount of a short-acting local anesthetic, loaded on a 10cc syringe with a 25G, 1.5-in  Needle. An appropriate amount of time was allowed for local anesthetics to take effect before proceeding to the next step. Local Anesthetic: Lidocaine 2.0% The unused portion of the local anesthetic was discarded in the proper designated containers. Technical explanation of process:  L2 Medial Branch Nerve Block (MBB): The target area for the L2 medial branch is at the junction of the postero-lateral aspect of the superior articular process and the superior, posterior, and medial edge of the transverse process of L3.  Under fluoroscopic guidance, a Quincke needle was inserted until contact was made with os over the superior postero-lateral aspect of the pedicular shadow (target area). After negative aspiration for blood, 0.5 mL of the nerve block solution was injected without difficulty or complication. The needle was removed intact. L3 Medial Branch Nerve Block (MBB): The target area for  the L3 medial branch is at the junction of the postero-lateral aspect of the superior articular process and the superior, posterior, and medial edge of the transverse process of L4. Under fluoroscopic guidance, a Quincke needle was inserted until contact was made with os over the superior postero-lateral aspect of the pedicular shadow (target area). After negative aspiration for blood, 0.5 mL of the nerve block solution was injected without difficulty or complication. The needle was removed intact. L4 Medial Branch Nerve Block (MBB): The target area for the L4 medial branch is at the junction of the postero-lateral aspect of the superior articular process and the superior, posterior, and medial edge of the transverse process of L5. Under fluoroscopic guidance, a Quincke needle was inserted until contact was made with os over the superior postero-lateral aspect of the pedicular shadow (target area). After negative aspiration for blood, 0.5 mL of the nerve block solution was injected without difficulty or complication. The needle was removed intact. L5 Medial Branch Nerve Block (MBB): The target area for the L5 medial branch is at the junction of the postero-lateral aspect of the superior articular process and the superior, posterior, and medial edge of the sacral ala. Under fluoroscopic guidance, a Quincke needle was inserted until contact was made with os over the superior postero-lateral aspect of the pedicular shadow (target area). After negative aspiration for blood, 0.5 mL of the nerve block solution was injected without difficulty or  complication. The needle was removed intact. S1 Medial Branch Nerve Block (MBB): The target area for the S1 medial branch is at the posterior and inferior 6 o'clock position of the L5-S1 facet joint. Under fluoroscopic guidance, the Quincke needle inserted for the L5 MBB was redirected until contact was made with os over the inferior and postero aspect of the sacrum, at the 6 o' clock position under the L5-S1 facet joint (Target area). After negative aspiration for blood, 0.5 mL of the nerve block solution was injected without difficulty or complication. The needle was removed intact.  Nerve block solution: 0.2% PF-Ropivacaine + Triamcinolone (40 mg/mL) diluted to a final concentration of 4 mg of Triamcinolone/mL of Ropivacaine The unused portion of the solution was discarded in the proper designated containers. Procedural Needles: 22-gauge, 3.5-inch, Quincke needles used for all levels.  Once the entire procedure was completed, the treated area was cleaned, making sure to leave some of the prepping solution back to take advantage of its long term bactericidal properties.   Illustration of the posterior view of the lumbar spine and the posterior neural structures. Laminae of L2 through S1 are labeled. DPRL5, dorsal primary ramus of L5; DPRS1, dorsal primary ramus of S1; DPR3, dorsal primary ramus of L3; FJ, facet (zygapophyseal) joint L3-L4; I, inferior articular process of L4; LB1, lateral branch of dorsal primary ramus of L1; IAB, inferior articular branches from L3 medial branch (supplies L4-L5 facet joint); IBP, intermediate branch plexus; MB3, medial branch of dorsal primary ramus of L3; NR3, third lumbar nerve root; S, superior articular process of L5; SAB, superior articular branches from L4 (supplies L4-5 facet joint also); TP3, transverse process of L3.  Vitals:   02/06/20 1245 02/06/20 1341 02/06/20 1344 02/06/20 1349  BP: 132/84 125/72 121/68 135/87  Pulse: (!) 41     Resp: 16 12 11 11     Temp: 98.1 F (36.7 C)     TempSrc: Temporal     SpO2: 98% 97% 97% 100%  Weight: 179 lb (81.2 kg)     Height: 5'  10" (1.778 m)        Start Time: 1343 hrs. End Time: 1348 hrs.  Imaging Guidance (Spinal):          Type of Imaging Technique: Fluoroscopy Guidance (Spinal) Indication(s): Assistance in needle guidance and placement for procedures requiring needle placement in or near specific anatomical locations not easily accessible without such assistance. Exposure Time: Please see nurses notes. Contrast: None used. Fluoroscopic Guidance: I was personally present during the use of fluoroscopy. "Tunnel Vision Technique" used to obtain the best possible view of the target area. Parallax error corrected before commencing the procedure. "Direction-depth-direction" technique used to introduce the needle under continuous pulsed fluoroscopy. Once target was reached, antero-posterior, oblique, and lateral fluoroscopic projection used confirm needle placement in all planes. Images permanently stored in EMR. Interpretation: No contrast injected. I personally interpreted the imaging intraoperatively. Adequate needle placement confirmed in multiple planes. Permanent images saved into the patient's record.  Antibiotic Prophylaxis:   Anti-infectives (From admission, onward)   None     Indication(s): None identified  Post-operative Assessment:  Post-procedure Vital Signs:  Pulse/HCG Rate: (!) 41(!) 47 Temp: 98.1 F (36.7 C) Resp: 11 BP: 135/87 SpO2: 100 %  EBL: None  Complications: No immediate post-treatment complications observed by team, or reported by patient.  Note: The patient tolerated the entire procedure well. A repeat set of vitals were taken after the procedure and the patient was kept under observation following institutional policy, for this type of procedure. Post-procedural neurological assessment was performed, showing return to baseline, prior to discharge. The patient was  provided with post-procedure discharge instructions, including a section on how to identify potential problems. Should any problems arise concerning this procedure, the patient was given instructions to immediately contact us, at any time, without hesitation. In any case, we plan to contact the patient by telephone for a follow-up status report regarding this interventional procedure.  Comments:  No additional relevant information.  Plan of Care  Orders:  Orders Placed This Encounter  Procedures  . LUMBAR FACET(MEDIAL BRANCH NERVE BLOCK) MBNB    Scheduling Instructions:     Procedure: Lumbar facet block (AKA.: Lumbosacral medial branch nerve block)     Side: Left-sided     Level: L3-4, L4-5, & L5-S1 Facets (L2, L3, L4, L5, & S1 Medial Branch Nerves)     Sedation: Patient's choice.     Timeframe: Today    Order Specific Question:   Where will this procedure be performed?    Answer:   [facility]  . DG PAIN [facility] C-ARM 1-60 MIN NO REPORT    Intraoperative interpretation by procedural physician at [facility].    Standing Status:   Standing    Number of Occurrences:   1    Order Specific Question:   Reason for exam:    Answer:   Assistance in needle guidance and placement for procedures requiring needle placement in or near specific anatomical locations not easily accessible without such assistance.  . Informed Consent Details: Physician/Practitioner Attestation; Transcribe to consent form and obtain patient signature    Nursing Order: Transcribe to consent form and obtain patient signature. Note: Always confirm laterality of pain with Mr. Smiles, before procedure. Procedure: Lumbar Facet Block  under fluoroscopic guidance Indication/Reason: Low Back Pain, with our without leg pain, due to Facet Joint Arthralgia (Joint Pain) known as Lumbar Facet Syndrome, secondary to Lumbar, and/or Lumbosacral Spondylosis (Arthritis of the Spine), without myelopathy or radiculopathy  (Nerve Damage). Provider Attestation: I,  Dashayla Theissen A. Dossie Arbour, MD, (Pain Management Specialist), the physician/practitioner, attest that I have discussed with the patient the benefits, risks, side effects, alternatives, likelihood of achieving goals and potential problems during recovery for the procedure that I have provided informed consent.  . Provide equipment / supplies at bedside    Equipment required: Single use, disposable, "Block Tray"    Standing Status:   Standing    Number of Occurrences:   1    Order Specific Question:   Specify    Answer:   Block Tray   Chronic Opioid Analgesic:  No opioid analgesics prescribed by our practice.   Medications ordered for procedure: Meds ordered this encounter  Medications  . lidocaine (XYLOCAINE) 2 % (with pres) injection 400 mg  . DISCONTD: lactated ringers infusion 1,000 mL  . DISCONTD: midazolam (VERSED) 5 MG/5ML injection 1-2 mg    Make sure Flumazenil is available in the pyxis when using this medication. If oversedation occurs, administer 0.2 mg IV over 15 sec. If after 45 sec no response, administer 0.2 mg again over 1 min; may repeat at 1 min intervals; not to exceed 4 doses (1 mg)  . DISCONTD: fentaNYL (SUBLIMAZE) injection 25-50 mcg    Make sure Narcan is available in the pyxis when using this medication. In the event of respiratory depression (RR< 8/min): Titrate NARCAN (naloxone) in increments of 0.1 to 0.2 mg IV at 2-3 minute intervals, until desired degree of reversal.  . ropivacaine (PF) 2 mg/mL (0.2%) (NAROPIN) injection 9 mL  . triamcinolone acetonide (KENALOG-40) injection 40 mg   Medications administered: We administered lidocaine, ropivacaine (PF) 2 mg/mL (0.2%), and triamcinolone acetonide.  See the medical record for exact dosing, route, and time of administration.  Follow-up plan:   Return in about 2 weeks (around 02/20/2020) for F2F(20-min), (PP).       Interventional Therapies:  Considering:   Diagnostic left-sided  lumbar facet block #1    PRN Procedures:   Palliative/therapeutic right T11-12 TESI #2  Palliative right L4-5 LESI #2  Palliative left L4-5 LESI #2  Palliative TPI/MNB      Recent Visits Date Type Provider Dept  01/30/20 Office Visit Milinda Pointer, MD [facility]  Showing recent visits within past 90 days and meeting all other requirements Today's Visits Date Type Provider Dept  02/06/20 Procedure visit Milinda Pointer, MD [facility]  Showing today's visits and meeting all other requirements Future Appointments Date Type Provider Dept  02/26/20 Appointment Milinda Pointer, MD [facility]  Showing future appointments within next 90 days and meeting all other requirements  Disposition: Discharge home  Discharge (Date  Time): 02/06/2020; 1408 hrs.   Primary Care Physician: Birdie Sons, MD Location: [facility] Note by: Gaspar Cola, MD Date: 02/06/2020; Time: 4:47 PM  Disclaimer:  Medicine is not an Chief Strategy Officer. The only guarantee in medicine is that nothing is guaranteed. It is important to note that the decision to proceed with this intervention was based on the information collected from the patient. The Data and conclusions were drawn from the patient's questionnaire, the interview, and the physical examination. Because the information was provided in large part by the patient, it cannot be guaranteed that it has not been purposely or unconsciously manipulated. Every effort has been made to obtain as much relevant data as possible for this evaluation. It is important to note that the conclusions that lead to this procedure are derived in large part from the available  data. Always take into account that the treatment will also be dependent on availability of resources and existing treatment guidelines, considered by other Pain Management Practitioners as being common knowledge and practice, at the time of  the intervention. For Medico-Legal purposes, it is also important to point out that variation in procedural techniques and pharmacological choices are the acceptable norm. The indications, contraindications, technique, and results of the above procedure should only be interpreted and judged by a Board-Certified Interventional Pain Specialist with extensive familiarity and expertise in the same exact procedure and technique.

## 2020-02-07 ENCOUNTER — Telehealth: Payer: Self-pay | Admitting: *Deleted

## 2020-02-07 NOTE — Telephone Encounter (Signed)
Attempted to call for post procedure follow-up. Message left. 

## 2020-02-08 ENCOUNTER — Other Ambulatory Visit: Payer: Self-pay

## 2020-02-08 MED ORDER — AMIODARONE HCL 200 MG PO TABS: 300.0000 mg | ORAL_TABLET | Freq: Every day | ORAL | 2 refills | Status: DC

## 2020-02-08 NOTE — Telephone Encounter (Signed)
Pt's medication was sent to pt's pharmacy as requested. Confirmation received.  °

## 2020-02-12 ENCOUNTER — Telehealth: Payer: Self-pay

## 2020-02-12 NOTE — Telephone Encounter (Signed)
Copied from [place] [id number]. Topic: General - Other >> Feb 12, 2020 12:00 PM Cook, Richard wrote: Reason for CRM: Pt stated he really needs the practice administrator Sarah to call him back. Pt stated it is very important and urgent that she returns his call. Cb# [phone]

## 2020-02-20 ENCOUNTER — Other Ambulatory Visit: Payer: Self-pay | Admitting: Family Medicine

## 2020-02-20 DIAGNOSIS — M5489 Other dorsalgia: Secondary | ICD-10-CM

## 2020-02-20 MED ORDER — HYDROCODONE-ACETAMINOPHEN 10-325 MG PO TABS: 1.0000 | ORAL_TABLET | Freq: Three times a day (TID) | ORAL | 0 refills | Status: DC | PRN

## 2020-02-20 NOTE — Telephone Encounter (Signed)
Medication Refill - Medication: HYDROcodone-acetaminophen (NORCO) 10-325 MG tablet   Has the patient contacted their pharmacy? Yes.   (Agent: If no, request that the patient contact the pharmacy for the refill.) (Agent: If yes, when and what did the pharmacy advise?)  Preferred Pharmacy (with phone number or street name):  [id number] - [place], [place] [address]  Phone: [phone] Fax: [phone]     Agent: Please be advised that RX refills may take up to 3 business days. We ask that you follow-up with your pharmacy.

## 2020-02-25 NOTE — Progress Notes (Deleted)
No show

## 2020-02-26 ENCOUNTER — Ambulatory Visit: Payer: PPO | Admitting: Pain Medicine

## 2020-02-27 ENCOUNTER — Other Ambulatory Visit: Payer: Self-pay | Admitting: Family Medicine

## 2020-02-27 DIAGNOSIS — F411 Generalized anxiety disorder: Secondary | ICD-10-CM

## 2020-02-27 NOTE — Telephone Encounter (Signed)
Requested medication (s) are due for refill today: no  Requested medication (s) are on the active medication list: yes  Last refill:  12/26/2019  Future visit scheduled: yes  Notes to clinic: this refill cannot be delegated   Requested Prescriptions  Pending Prescriptions Disp Refills   clonazePAM (KLONOPIN) 1 MG tablet [Pharmacy Med Name: CLONAZEPAM 1 MG TABLET] 75 tablet     Sig: TAKE ONE TABLET EVERY AFTERNOON AND 1 & 1/2 TABLET AT BEDTIME      Not Delegated - Psychiatry:  Anxiolytics/Hypnotics Failed - 02/27/2020  2:33 PM      Failed - This refill cannot be delegated      Failed - Urine Drug Screen completed in last 360 days.      Passed - Valid encounter within last 6 months    Recent Outpatient Visits           1 month ago Seborrhea capitis   [facility] Birdie Sons, MD   2 months ago CAD- PCI to RCA 08/10/11, CABG in [place] 5/14   [place], Kirstie Peri, MD   7 months ago Acute non-recurrent sinusitis, unspecified location   [facility] Flinchum, Kelby Aline, FNP   1 year ago Edema, unspecified type   [facility] Birdie Sons, MD   1 year ago 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, stage 5 [facility])   Armstrong, Donald E, MD       Future Appointments             In 3 weeks Fisher, Kirstie Peri, MD [facility]

## 2020-03-08 ENCOUNTER — Ambulatory Visit (INDEPENDENT_AMBULATORY_CARE_PROVIDER_SITE_OTHER): Payer: PPO | Admitting: *Deleted

## 2020-03-08 DIAGNOSIS — I472 Ventricular tachycardia, unspecified: Secondary | ICD-10-CM

## 2020-03-08 LAB — CUP PACEART REMOTE DEVICE CHECK
Battery Remaining Longevity: 56 mo
Battery Voltage: 2.98 V
Brady Statistic RV Percent Paced: 0.4 %
Date Time Interrogation Session: 20210806123423
HighPow Impedance: 86 Ohm
Implantable Lead Implant Date: 20150318
Implantable Lead Location: 753860
Implantable Lead Model: 181
Implantable Lead Serial Number: 327195
Implantable Pulse Generator Implant Date: 20150318
Lead Channel Impedance Value: 665 Ohm
Lead Channel Impedance Value: 665 Ohm
Lead Channel Pacing Threshold Amplitude: 0.875 V
Lead Channel Pacing Threshold Pulse Width: 0.4 ms
Lead Channel Sensing Intrinsic Amplitude: 8.125 mV
Lead Channel Sensing Intrinsic Amplitude: 8.125 mV
Lead Channel Setting Pacing Amplitude: 2 V
Lead Channel Setting Pacing Pulse Width: 0.4 ms
Lead Channel Setting Sensing Sensitivity: 0.3 mV

## 2020-03-11 NOTE — Progress Notes (Signed)
Remote ICD transmission.   

## 2020-03-14 ENCOUNTER — Other Ambulatory Visit: Payer: Self-pay | Admitting: Family Medicine

## 2020-03-14 DIAGNOSIS — M1A09X0 Idiopathic chronic gout, multiple sites, without tophus (tophi): Secondary | ICD-10-CM

## 2020-03-25 ENCOUNTER — Other Ambulatory Visit: Payer: Self-pay

## 2020-03-25 ENCOUNTER — Ambulatory Visit (INDEPENDENT_AMBULATORY_CARE_PROVIDER_SITE_OTHER): Payer: PPO | Admitting: Family Medicine

## 2020-03-25 ENCOUNTER — Encounter: Payer: Self-pay | Admitting: Family Medicine

## 2020-03-25 VITALS — BP 107/70 | HR 58 | Temp 98.6°F | Wt 191.6 lb

## 2020-03-25 DIAGNOSIS — I10 Essential (primary) hypertension: Secondary | ICD-10-CM | POA: Diagnosis not present

## 2020-03-25 DIAGNOSIS — E039 Hypothyroidism, unspecified: Secondary | ICD-10-CM | POA: Diagnosis not present

## 2020-03-25 DIAGNOSIS — M5489 Other dorsalgia: Secondary | ICD-10-CM | POA: Diagnosis not present

## 2020-03-25 DIAGNOSIS — G4700 Insomnia, unspecified: Secondary | ICD-10-CM

## 2020-03-25 DIAGNOSIS — E785 Hyperlipidemia, unspecified: Secondary | ICD-10-CM | POA: Diagnosis not present

## 2020-03-25 DIAGNOSIS — I5042 Chronic combined systolic (congestive) and diastolic (congestive) heart failure: Secondary | ICD-10-CM | POA: Diagnosis not present

## 2020-03-25 MED ORDER — HYDROCODONE-ACETAMINOPHEN 10-325 MG PO TABS: 1.0000 | ORAL_TABLET | Freq: Three times a day (TID) | ORAL | 0 refills | Status: DC | PRN

## 2020-03-25 NOTE — Progress Notes (Signed)
Established patient visit   Patient: Richard Cook   DOB: Jun 25, 1956   64 y.o. Male  MRN: [id number] Visit Date: 03/25/2020  Today's healthcare provider: Lelon Huh, MD   Chief Complaint  Patient presents with  . Insomnia   Subjective    HPI  Follow up for Insomnia:  The patient was last seen for this 2 months ago. Changes made at last visit include prescribing ROZEREM 8 MG tablet at bedtime. Patient was unable to afford the out of pocket cost of this medication.   He reports poor compliance with treatment. Patient reports that he not taking the Rozerem 8 MG. He feels that condition is Unchanged. He states taking 2 1/2 clonazepam does get him a good nights sleep, but he feels very groggy when he gets up. He has tried multiple other medications for sleep, none except clonazepam have been helpful.  He is not having side effects.   ----------------------------------------------------------------------------------------- Follow up hyperlipidemia.  Lab Results  Component Value Date   CHOL 249 (H) 12/22/2019   HDL 45 12/22/2019   LDLCALC 182 (H) 12/22/2019   TRIG 124 12/22/2019   CHOLHDL 5.5 (H) 12/22/2019   He started back on rosuvastatin since having labs checked in May and is tolerating well.      Medications: Outpatient Medications Prior to Visit  Medication Sig  . allopurinol (ZYLOPRIM) 300 MG tablet TAKE 1 TABLET BY MOUTH EVERY DAY  . amiodarone (PACERONE) 200 MG tablet Take 1.5 tablets (300 mg total) by mouth daily. You may take this in the morning  . aspirin EC 81 MG tablet Take 81 mg by mouth daily.  Marland Kitchen BEE POLLEN PO Take 1 capsule by mouth daily.   . bumetanide (BUMEX) 2 MG tablet Take 2 mg by mouth daily.  . clonazePAM (KLONOPIN) 1 MG tablet TAKE ONE TABLET EVERY AFTERNOON AND 1 & 1/2 TABLET AT BEDTIME  . furosemide (LASIX) 40 MG tablet TAKE 1 TABLET (40 MG TOTAL) BY MOUTH DAILY AS NEEDED FOR FLUID.  Marland Kitchen HYDROcodone-acetaminophen (NORCO) 10-325 MG  tablet Take 1 tablet by mouth every 8 (eight) hours as needed.  . hydrocortisone 1 % lotion Apply 1 application topically 2 (two) times daily.  . hydrOXYzine (ATARAX/VISTARIL) 25 MG tablet TAKE 1-2 TABLETS BY MOUTH AT BEDTIME AS NEEDED.  Marland Kitchen levothyroxine (SYNTHROID) 50 MCG tablet Take 1 tablet (50 mcg total) by mouth daily.  Marland Kitchen linaclotide (LINZESS) 72 MCG capsule Take 1 capsule (72 mcg total) by mouth daily before breakfast.  . metoprolol tartrate (LOPRESSOR) 25 MG tablet Take 1 tablet (25 mg total) by mouth daily.  Marland Kitchen mexiletine (MEXITIL) 250 MG capsule Take 1 capsule (250 mg total) by mouth 3 (three) times daily.  . mupirocin ointment (BACTROBAN) 2 % Apply 1 application topically 2 (two) times daily. Right nare apply small amount.  . Omega-3 Fatty Acids (FISH OIL PO) Take 1 capsule by mouth daily.  . rosuvastatin (CRESTOR) 40 MG tablet Take 1 tablet (40 mg total) by mouth daily.  . ramelteon (ROZEREM) 8 MG tablet Take 1 tablet (8 mg total) by mouth at bedtime. (Patient not taking: Reported on 03/25/2020)   No facility-administered medications prior to visit.    Review of Systems  Constitutional: Negative for activity change and fatigue.  Cardiovascular: Negative for chest pain, palpitations and leg swelling.  Neurological: Negative for dizziness, weakness and light-headedness.  Psychiatric/Behavioral: Positive for sleep disturbance. Negative for behavioral problems, confusion, decreased concentration and self-injury. The patient is not nervous/anxious.  Objective    BP 107/70 (BP Location: Right Arm, Patient Position: Sitting, Cuff Size: Normal)   Pulse (!) 58   Temp 98.6 F (37 C) (Oral)   Wt 191 lb 9.6 oz (86.9 kg)   SpO2 97%   BMI 27.49 kg/m    Physical Exam   General: Appearance:     Overweight male in no acute distress  Eyes:    PERRL, conjunctiva/corneas clear, EOM's intact       Lungs:     Clear to auscultation bilaterally, respirations unlabored  Heart:     Bradycardic. Normal rhythm. No murmurs, rubs, or gallops.   MS:   All extremities are intact.   Neurologic:   Awake, alert, oriented x 3. No apparent focal neurological           defect.          Assessment & Plan     1. Insomnia, unspecified type Only thing that allows him to sleep through the night is current clonazepam regiment. Failed multiple medications and options are limited due to potential interactions with his chronic cardiac medications. Will continue current dose of clonazepam for now.   2. Chronic combined systolic and diastolic CHF (congestive heart failure) (HCC) Stable on current regiment. Is to continue routine cardiology follow up.   3. Essential (primary) hypertension Well controlled.  Continue current medications.    4. Hypothyroidism, unspecified type  - TSH  5. Hypomagnesemia  - Magnesium  6. Hyperlipidemia, unspecified hyperlipidemia type He is tolerating rosuvastatin well with no adverse effects.   - Comprehensive metabolic panel - Lipid panel - CBC  7. Back pain without sciatica refill- HYDROcodone-acetaminophen (NORCO) 10-325 MG tablet; Take 1 tablet by mouth every 8 (eight) hours as needed.  Dispense: 60 tablet; Refill: 0       The entirety of the information documented in the History of Present Illness, Review of Systems and Physical Exam were personally obtained by me. Portions of this information were initially documented by the CMA and reviewed by me for thoroughness and accuracy.      Lelon Huh, MD  [facility] [phone] (phone) [phone] (fax)  [place]

## 2020-03-26 LAB — COMPREHENSIVE METABOLIC PANEL
ALT: 16 IU/L (ref 0–44)
AST: 17 IU/L (ref 0–40)
Albumin/Globulin Ratio: 1.8 (ref 1.2–2.2)
Albumin: 4.2 g/dL (ref 3.8–4.8)
Alkaline Phosphatase: 105 IU/L (ref 48–121)
BUN/Creatinine Ratio: 12 (ref 10–24)
BUN: 56 mg/dL — ABNORMAL HIGH (ref 8–27)
Bilirubin Total: 0.7 mg/dL (ref 0.0–1.2)
CO2: 22 mmol/L (ref 20–29)
Calcium: 9.5 mg/dL (ref 8.6–10.2)
Chloride: 101 mmol/L (ref 96–106)
Creatinine, Ser: 4.8 mg/dL — ABNORMAL HIGH (ref 0.76–1.27)
GFR calc Af Amer: 14 mL/min/{1.73_m2} — ABNORMAL LOW (ref >59)
GFR calc non Af Amer: 12 mL/min/{1.73_m2} — ABNORMAL LOW (ref >59)
Globulin, Total: 2.3 g/dL (ref 1.5–4.5)
Glucose: 91 mg/dL (ref 65–99)
Potassium: 4 mmol/L (ref 3.5–5.2)
Sodium: 142 mmol/L (ref 134–144)
Total Protein: 6.5 g/dL (ref 6.0–8.5)

## 2020-03-26 LAB — CBC
Hematocrit: 44 % (ref 37.5–51.0)
Hemoglobin: 15 g/dL (ref 13.0–17.7)
MCH: 32.1 pg (ref 26.6–33.0)
MCHC: 34.1 g/dL (ref 31.5–35.7)
MCV: 94 fL (ref 79–97)
Platelets: 133 10*3/uL — ABNORMAL LOW (ref 150–450)
RBC: 4.68 x10E6/uL (ref 4.14–5.80)
RDW: 14.1 % (ref 11.6–15.4)
WBC: 7.2 10*3/uL (ref 3.4–10.8)

## 2020-03-26 LAB — TSH: TSH: 8.46 u[IU]/mL — ABNORMAL HIGH (ref 0.450–4.500)

## 2020-03-26 LAB — LIPID PANEL
Chol/HDL Ratio: 2.8 ratio (ref 0.0–5.0)
Cholesterol, Total: 154 mg/dL (ref 100–199)
HDL: 55 mg/dL (ref >39)
LDL Chol Calc (NIH): 86 mg/dL (ref 0–99)
Triglycerides: 62 mg/dL (ref 0–149)
VLDL Cholesterol Cal: 13 mg/dL (ref 5–40)

## 2020-03-26 LAB — MAGNESIUM: Magnesium: 2.2 mg/dL (ref 1.6–2.3)

## 2020-03-27 ENCOUNTER — Telehealth: Payer: Self-pay

## 2020-03-27 DIAGNOSIS — N2581 Secondary hyperparathyroidism of renal origin: Secondary | ICD-10-CM | POA: Diagnosis not present

## 2020-03-27 DIAGNOSIS — N184 Chronic kidney disease, stage 4 (severe): Secondary | ICD-10-CM | POA: Diagnosis not present

## 2020-03-27 DIAGNOSIS — I129 Hypertensive chronic kidney disease with stage 1 through stage 4 chronic kidney disease, or unspecified chronic kidney disease: Secondary | ICD-10-CM | POA: Diagnosis not present

## 2020-03-27 DIAGNOSIS — E039 Hypothyroidism, unspecified: Secondary | ICD-10-CM

## 2020-03-27 MED ORDER — LEVOTHYROXINE SODIUM 75 MCG PO TABS: 75.0000 ug | ORAL_TABLET | Freq: Every day | ORAL | 3 refills | Status: DC

## 2020-03-27 NOTE — Telephone Encounter (Signed)
Patient has been advised and prescription has been sent to pharmacy. KW

## 2020-03-27 NOTE — Telephone Encounter (Signed)
-----   Message from Birdie Sons, MD sent at 03/26/2020  8:19 AM EDT ----- Is hypothyroid which can contribute to fatigue. Kidney functions are a little worse. Be sure to keep appt with Dr. Juleen China in September. Increase levothyroxine to 57mcg daily, can send in prescription for #30 and 3 refills. Schedule follow up in 12 weeks.

## 2020-03-29 ENCOUNTER — Other Ambulatory Visit: Payer: Self-pay | Admitting: Family Medicine

## 2020-03-29 DIAGNOSIS — E039 Hypothyroidism, unspecified: Secondary | ICD-10-CM

## 2020-04-03 ENCOUNTER — Telehealth: Payer: Self-pay | Admitting: Internal Medicine

## 2020-04-03 NOTE — Telephone Encounter (Signed)
New Message   Patient is calling because he is feeling very weak, tired, legs shaking and sometimes breathing heavy. He states that his PCP believe its his thyroid. But he is concerned that it its his heart and when he did an internet search and  it did say his thyroid as well. He would like to speak with the nurse about his concerns as well. Please call to discuss. He is scheduled to see Dr. [name] 10/01 I did offer a PA he declined.

## 2020-04-03 NOTE — Telephone Encounter (Signed)
Spoke with pt and he has noted over the last month legs shaking,breathing hard ,energy zapped Per pt had episode yesterday after 30-45 of activity. Per pt is currently having thyroid treated hypothyroid Synthroid was recently increased to 75 mcg.Pt is also on Amiodarone .Will forward to Dr [name] for review and recommendations./cy

## 2020-04-05 ENCOUNTER — Telehealth: Payer: Self-pay

## 2020-04-05 NOTE — Telephone Encounter (Signed)
I called and spoke with patient. Patient states that during his last office visit he was told that he had a high thyroid. He states that he informed Dr. Caryn Section that he was having symptoms of fatigue, weakness and shortness of breath. Per patient,  Dr. Caryn Section increased his thyroid medication and told him that his symptoms could be coming from exposure to the sun and him getting older. Patient states that his symptoms are not getting any better. 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 Patient says the last trail he hiked on he became extremely short of breath, fatigued and weak. He states he fell over on the ground due to  weakness and was unable to pull himself up. Patient says he googled side effects of a "high thyroid" and it list all the symptoms that he has been having. Patient says he is upset that he had to do a google search to find this information out. Patient wants to know what he should do about his symptoms and whether he needs to see a specialist? Please advise.

## 2020-04-05 NOTE — Telephone Encounter (Signed)
Patient advised and verbalized understanding. Patient states he doesn't know why this wasn't explained to him during the last office visit after he told Dr. Caryn Section about his symptoms. Patient states that he has had a bad experience the past 2 times he was here in the office. Patient states that he plans to have his Cardiologist refer him to a new PCP.

## 2020-04-05 NOTE — Telephone Encounter (Signed)
His TSH was high, which means his thyroid hormone is low. This is why we increased his thyroid hormone dose (levothyroxine) on 8-25. It takes 1-2 months after a dosing adjustment for the thyroid levels to stabilize, so he should not expect to feel any better for several weeks.

## 2020-04-05 NOTE — Telephone Encounter (Signed)
Copied from [place] [id number]. Topic: General - Inquiry >> Apr 05, 2020  2:08 PM Gillis Ends D wrote: Reason for CRM: Patient request a call back from Dr. Caryn Section or his nurse. He has some concerns about his Tyroid. Call him at [phone]. Please advise

## 2020-04-08 ENCOUNTER — Other Ambulatory Visit: Payer: Self-pay | Admitting: Family Medicine

## 2020-04-08 DIAGNOSIS — M1A09X0 Idiopathic chronic gout, multiple sites, without tophus (tophi): Secondary | ICD-10-CM

## 2020-04-08 NOTE — Telephone Encounter (Signed)
He will need to get seen  either by his PCP or APP  or me as scheduled  Doylene Canning can we put him on cancellation move up Thanks SK

## 2020-04-09 NOTE — Telephone Encounter (Signed)
Attempted phone call to pt and left voicemail message to contact RN at [phone]. 

## 2020-04-10 NOTE — Telephone Encounter (Signed)
Lm to call back ./cy 

## 2020-04-10 NOTE — Progress Notes (Signed)
Established patient visit   Patient: Richard Cook   DOB: 09/05/1955   64 y.o. Male  MRN: [id number] Visit Date: 04/11/2020  Today's healthcare provider: Trinna Post, PA-C   Chief Complaint  Patient presents with  . Hypothyroidism   Subjective    HPI   Patient has a history of CKD IV, ischemic cardiomyopathy with AICD, LVF 25-30% on 2019 echo, ventricular tachycardia with history of ablation presents today with concerns over syncope that occurred two weeks ago. 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 He reports one day, without warning, he felt dizzy and passed out. He saw his PCP about it and reports he was told it was old age and sun exposure. He feels it is due to his thyroid as he has googled the symptoms and is upset this was not addressed more. He wants to know why he passed out and what he can do about his thyroid. He denies any SOB or palpitations.   He has a history of subclinical hypothyroidism with TSH ranging from 4-12 over the past several years. His follow up T3 and T4 have been in normal range. He was started on synthroid several months ago and on 03/25/2020 had his dose increased to 75 mcg daily. He is followed by nephrology for CKD IV and on recent CMET his serum creatinine has increased significantly.   Lab Results  Component Value Date   TSH 8.460 (H) 03/25/2020   TSH 7.680 (H) 12/22/2019   TSH 7.380 (H) 10/25/2019   FREET4 1.01 07/17/2019   FREET4 1.28 10/25/2017   Wt Readings from Last 3 Encounters:  04/11/20 187 lb (84.8 kg)  03/25/20 191 lb 9.6 oz (86.9 kg)  02/06/20 179 lb (81.2 kg)    He was last seen for hypothyroid 1 months ago.  Management since that visit includes increasing levothyroxine to 32mcg daily. He reports good compliance with treatment.  Symptoms: Yes change in energy level No constipation  No diarrhea No heat / cold intolerance  No nervousness No palpitations  No weight changes    CKD IV  CMP  Latest Ref Rng & Units 03/25/2020 05/25/2019 10/24/2018  Glucose 65 - 99 mg/dL 91 - 94  BUN 8 - 27 mg/dL 56(H) - 48(H)  Creatinine 0.76 - 1.27 mg/dL 4.80(H) - 3.38(H)  Sodium 134 - 144 mmol/L 142 - 143  Potassium 3.5 - 5.2 mmol/L 4.0 - 4.1  Chloride 96 - 106 mmol/L 101 - 100  CO2 20 - 29 mmol/L 22 - 24  Calcium 8.6 - 10.2 mg/dL 9.5 - 9.6  Total Protein 6.0 - 8.5 g/dL 6.5 8.0 -  Total Bilirubin 0.0 - 1.2 mg/dL 0.7 1.0 -  Alkaline Phos 48 - 121 IU/L 105 160(H) -  AST 0 - 40 IU/L 17 30 -  ALT 0 - 44 IU/L 16 30 -        Medications: Outpatient Medications Prior to Visit  Medication Sig  . allopurinol (ZYLOPRIM) 300 MG tablet TAKE 1 TABLET BY MOUTH EVERY DAY  . amiodarone (PACERONE) 200 MG tablet Take 1.5 tablets (300 mg total) by mouth daily. You may take this in the morning  . aspirin EC 81 MG tablet Take 81 mg by mouth daily.  Marland Kitchen BEE POLLEN PO Take 1 capsule by mouth daily.   . bumetanide (BUMEX) 2 MG tablet Take 2 mg by mouth daily.  . clonazePAM (KLONOPIN) 1 MG tablet TAKE ONE TABLET EVERY AFTERNOON AND  1 & 1/2 TABLET AT BEDTIME  . furosemide (LASIX) 40 MG tablet TAKE 1 TABLET (40 MG TOTAL) BY MOUTH DAILY AS NEEDED FOR FLUID.  Marland Kitchen HYDROcodone-acetaminophen (NORCO) 10-325 MG tablet Take 1 tablet by mouth every 8 (eight) hours as needed.  . hydrocortisone 1 % lotion Apply 1 application topically 2 (two) times daily.  . hydrOXYzine (ATARAX/VISTARIL) 25 MG tablet TAKE 1-2 TABLETS BY MOUTH AT BEDTIME AS NEEDED.  Marland Kitchen levothyroxine (SYNTHROID) 75 MCG tablet Take 1 tablet (75 mcg total) by mouth daily.  Marland Kitchen linaclotide (LINZESS) 72 MCG capsule Take 1 capsule (72 mcg total) by mouth daily before breakfast.  . metoprolol tartrate (LOPRESSOR) 25 MG tablet Take 1 tablet (25 mg total) by mouth daily.  Marland Kitchen mexiletine (MEXITIL) 250 MG capsule Take 1 capsule (250 mg total) by mouth 3 (three) times daily.  . mupirocin ointment (BACTROBAN) 2 % Apply 1 application topically 2 (two) times daily. Right nare  apply small amount.  . Omega-3 Fatty Acids (FISH OIL PO) Take 1 capsule by mouth daily.  . rosuvastatin (CRESTOR) 40 MG tablet Take 1 tablet (40 mg total) by mouth daily.   No facility-administered medications prior to visit.    Review of Systems    Objective    BP 98/66 (BP Location: Right Arm)   Pulse (!) 58   Temp 98.6 F (37 C)   Ht 5\' 10"  (1.778 m)   Wt 187 lb (84.8 kg)   BMI 26.83 kg/m    Physical Exam Vitals reviewed: Exam declined.       No results found for any visits on 04/11/20.  Assessment & Plan    1. Syncope, unspecified syncope type  Counseled patient he has subclinical hypothyroidism and I feel this unlikely to be the cause of his symptoms, particularly since his TSH has been higher in the past and he did not have issues. I think his symptoms are more likely related uremia 2/2 CKD which has worsened significantly or cardiac 2/2 heart failure, possibly arrythmia. Moreover, he is currently being treated with synthroid which has been adjusted on 03/25/2020. I have counseled him it is too early to check labs in order to adjust his medications.   Patient is very upset by this answer. He perseverates on his thyroid being the root cause of this issue. He reports his cardiologist told him he can walk 10,000 miles on the [place] trail if he wanted to and I am making it sound like he will die at any minute. He reports any arrhythmias in his heart have been fixed by ablation. He is aware his kidney function has worsened but does not seem convinced this could be a cause of his symptoms.  He reports he will not be seeing his PCP again or anyone at this clinic. He leaves the exam room before I am able to do an exam or make any further treatment recommendations.  2. ICD (implantable cardioverter-defibrillator) in place   3. 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, stage 5 ([place])  F/u with nephrology.  4. Chronic combined systolic and  diastolic CHF (congestive heart failure) ([place])  F/u with nephrology.      Return if symptoms worsen or fail to improve.      ITrinna Post, PA-C, have reviewed all documentation for this visit. The documentation on 04/11/20 for the exam, diagnosis, procedures, and orders are all accurate and complete.  The entirety of the information documented in the History of Present Illness, Review  of Systems and Physical Exam were personally obtained by me. Portions of this information were initially documented by Wilburt Finlay, CMA and reviewed by me for thoroughness and accuracy.   I spent 40 minutes dedicated to the care of this patient on the date of this encounter to include pre-visit review of records, face-to-face time with the patient discussing causes of syncope, chronic illness, and post visit ordering of testing.    Paulene Floor  [facility] [phone] (phone) [phone] (fax)  [place]

## 2020-04-11 ENCOUNTER — Other Ambulatory Visit: Payer: Self-pay

## 2020-04-11 ENCOUNTER — Encounter: Payer: Self-pay | Admitting: Physician Assistant

## 2020-04-11 ENCOUNTER — Ambulatory Visit (INDEPENDENT_AMBULATORY_CARE_PROVIDER_SITE_OTHER): Payer: PPO | Admitting: Physician Assistant

## 2020-04-11 VITALS — BP 98/66 | HR 58 | Temp 98.6°F | Ht 70.0 in | Wt 187.0 lb

## 2020-04-11 DIAGNOSIS — N185 Chronic kidney disease, stage 5: Secondary | ICD-10-CM | POA: Diagnosis not present

## 2020-04-11 DIAGNOSIS — I5042 Chronic combined systolic (congestive) and diastolic (congestive) heart failure: Secondary | ICD-10-CM

## 2020-04-11 DIAGNOSIS — R55 Syncope and collapse: Secondary | ICD-10-CM | POA: Diagnosis not present

## 2020-04-11 DIAGNOSIS — Z95810 Presence of automatic (implantable) cardiac defibrillator: Secondary | ICD-10-CM | POA: Diagnosis not present

## 2020-04-11 NOTE — Patient Instructions (Signed)
     Syncope Syncope is when you pass out (faint) for a short time. It is caused by a sudden decrease in blood flow to the brain. Signs that you may be about to pass out include:  Feeling dizzy or light-headed.  Feeling sick to your stomach (nauseous).  Seeing all white or all black.  Having cold, clammy skin. If you pass out, get help right away. Call your local emergency services (911 in the [place] Do not drive yourself to the hospital. Follow these instructions at home: Watch for any changes in your symptoms. Take these actions to stay safe and help with your symptoms: Lifestyle  Do not drive, use machinery, or play sports until your doctor says it is okay.  Do not drink alcohol.  Do not use any products that contain nicotine or tobacco, such as cigarettes and e-cigarettes. If you need help quitting, ask your doctor.  Drink enough fluid to keep your pee (urine) pale yellow. General instructions  Take over-the-counter and prescription medicines only as told by your doctor.  If you are taking blood pressure or heart medicine, sit up and stand up slowly. Spend a few minutes getting ready to sit and then stand. This can help you feel less dizzy.  Have someone stay with you until you feel stable.  If you start to feel like you might pass out, lie down right away and raise (elevate) your feet above the level of your heart. Breathe deeply and steadily. Wait until all of the symptoms are gone.  Keep all follow-up visits as told by your doctor. This is important. Get help right away if:  You have a very bad headache.  You pass out once or more than once.  You have pain in your chest, belly, or back.  You have a very fast or uneven heartbeat (palpitations).  It hurts to breathe.  You are bleeding from your mouth or your bottom (rectum).  You have black or tarry poop (stool).  You have jerky movements that you cannot control (seizure).  You are confused.  You have  trouble walking.  You are very weak.  You have vision problems. These symptoms may be an emergency. Do not wait to see if the symptoms will go away. Get medical help right away. Call your local emergency services (911 in the [place] Do not drive yourself to the hospital. Summary  Syncope is when you pass out (faint) for a short time. It is caused by a sudden decrease in blood flow to the brain.  Signs that you may be about to faint include feeling dizzy, light-headed, or sick to your stomach, seeing all white or all black, or having cold, clammy skin.  If you start to feel like you might pass out, lie down right away and raise (elevate) your feet above the level of your heart. Breathe deeply and steadily. Wait until all of the symptoms are gone. This information is not intended to replace advice given to you by your health care provider. Make sure you discuss any questions you have with your health care provider. Document Revised: 09/01/2017 Document Reviewed: 09/01/2017 Elsevier Patient Education  2020 [employer]  

## 2020-04-16 NOTE — Telephone Encounter (Signed)
Spoke with pt who continues to complain of weakness and fatigue.  Pt states his PCP does not believe his problem is his thyroid.  Pt advised per Dr [name] he would like for pt to see APP in office sooner if possible than pt's appt with Dr [name] scheduled for 05/03/2020.  Pt advised will have Dr Olin Pia scheduler, Doylene Canning contact him to schedule appointment.  Pt verbalizes understanding and agrees with current plan.
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
## 2020-04-18 ENCOUNTER — Other Ambulatory Visit: Payer: Self-pay

## 2020-04-18 ENCOUNTER — Telehealth: Payer: Self-pay | Admitting: Internal Medicine

## 2020-04-18 ENCOUNTER — Ambulatory Visit: Payer: PPO | Admitting: Nurse Practitioner

## 2020-04-18 VITALS — BP 112/68 | HR 61 | Ht 70.0 in | Wt 188.0 lb

## 2020-04-18 DIAGNOSIS — E039 Hypothyroidism, unspecified: Secondary | ICD-10-CM

## 2020-04-18 DIAGNOSIS — Z7689 Persons encountering health services in other specified circumstances: Secondary | ICD-10-CM | POA: Diagnosis not present

## 2020-04-18 DIAGNOSIS — I5042 Chronic combined systolic (congestive) and diastolic (congestive) heart failure: Secondary | ICD-10-CM

## 2020-04-18 DIAGNOSIS — I255 Ischemic cardiomyopathy: Secondary | ICD-10-CM

## 2020-04-18 DIAGNOSIS — I472 Ventricular tachycardia, unspecified: Secondary | ICD-10-CM

## 2020-04-18 LAB — CUP PACEART INCLINIC DEVICE CHECK
Battery Remaining Longevity: 55 mo
Battery Voltage: 2.99 V
Brady Statistic RV Percent Paced: 0.24 %
Date Time Interrogation Session: 20210916101716
HighPow Impedance: 98 Ohm
Implantable Lead Implant Date: 20150318
Implantable Lead Location: 753860
Implantable Lead Model: 181
Implantable Lead Serial Number: 327195
Implantable Pulse Generator Implant Date: 20150318
Lead Channel Impedance Value: 722 Ohm
Lead Channel Impedance Value: 722 Ohm
Lead Channel Pacing Threshold Amplitude: 1.125 V
Lead Channel Pacing Threshold Pulse Width: 0.4 ms
Lead Channel Sensing Intrinsic Amplitude: 8.625 mV
Lead Channel Sensing Intrinsic Amplitude: 9.875 mV
Lead Channel Setting Pacing Amplitude: 2.25 V
Lead Channel Setting Pacing Pulse Width: 0.4 ms
Lead Channel Setting Sensing Sensitivity: 0.3 mV

## 2020-04-18 MED ORDER — AMIODARONE HCL 200 MG PO TABS: ORAL_TABLET | ORAL | 2 refills | Status: DC

## 2020-04-18 NOTE — Patient Instructions (Addendum)
Medication Instructions:  Your physician has recommended you make the following change in your medication:  --DECREASE AMIODARONE -- Take 1 tablet (200 mg) by mouth 5 days a week (Monday - Friday)  *If you need a refill on your cardiac medications before your next appointment, please call your pharmacy*  Testing/Procedures: Your physician has recommended that you have a cardiopulmonary stress test (CPX). CPX testing is a non-invasive measurement of heart and lung function. It replaces a traditional treadmill stress test. This type of test provides a tremendous amount of information that relates not only to your present condition but also for future outcomes. This test combines measurements of you ventilation, respiratory gas exchange in the lungs, electrocardiogram (EKG), blood pressure and physical response before, during, and following an exercise protocol. -- YOU MAY NEED TO HAVE A COVID SCREENING TEST BEFORE THIS TESTING CAN BE COMPLETED--   Follow-Up: At [facility], you and your health needs are our priority.  As part of our continuing mission to provide you with exceptional heart care, we have created designated Provider Care Teams.  These Care Teams include your primary Cardiologist (physician) and Advanced Practice Providers (APPs -  Physician Assistants and Nurse Practitioners) who all work together to provide you with the care you need, when you need it.  We recommend signing up for the patient portal called "MyChart".  Sign up information is provided on this After Visit Summary.  MyChart is used to connect with patients for Virtual Visits (Telemedicine).  Patients are able to view lab/test results, encounter notes, upcoming appointments, etc.  Non-urgent messages can be sent to your provider as well.   To learn more about what you can do with MyChart, go to [email or web address].    Your next appointment:   Your physician recommends that you keep your scheduled follow-up appointment  in October 2021 with Dr. [name].  The format for your next appointment:   In Person with Virl Axe, MD  -- A referral has been sent to [facility] for evaluation and treatment for hypothyroidism --  -- A referral has been sent to [facility] at [place] to establish primary care --

## 2020-04-18 NOTE — Telephone Encounter (Signed)
Left message for patient regarding the CPX test he needed scheduled. Told him his appt was for Oct 25,2021 at 2:00 pm. Told him if this appt did not work to call the number I gave [phone]). Will send out letter with appt time, date,and instructions.

## 2020-04-24 DIAGNOSIS — N2581 Secondary hyperparathyroidism of renal origin: Secondary | ICD-10-CM | POA: Diagnosis not present

## 2020-04-24 DIAGNOSIS — I129 Hypertensive chronic kidney disease with stage 1 through stage 4 chronic kidney disease, or unspecified chronic kidney disease: Secondary | ICD-10-CM | POA: Diagnosis not present

## 2020-04-24 DIAGNOSIS — N184 Chronic kidney disease, stage 4 (severe): Secondary | ICD-10-CM | POA: Diagnosis not present

## 2020-04-25 NOTE — Telephone Encounter (Signed)
Pt was seen by Chanetta Marshall NP on 04/18/20 .Adonis Housekeeper

## 2020-04-29 ENCOUNTER — Other Ambulatory Visit: Payer: Self-pay | Admitting: Family Medicine

## 2020-04-29 DIAGNOSIS — N184 Chronic kidney disease, stage 4 (severe): Secondary | ICD-10-CM | POA: Diagnosis not present

## 2020-04-29 DIAGNOSIS — I129 Hypertensive chronic kidney disease with stage 1 through stage 4 chronic kidney disease, or unspecified chronic kidney disease: Secondary | ICD-10-CM | POA: Diagnosis not present

## 2020-04-29 DIAGNOSIS — M5489 Other dorsalgia: Secondary | ICD-10-CM

## 2020-04-29 DIAGNOSIS — N2581 Secondary hyperparathyroidism of renal origin: Secondary | ICD-10-CM | POA: Diagnosis not present

## 2020-04-29 MED ORDER — HYDROCODONE-ACETAMINOPHEN 10-325 MG PO TABS
1.0000 | ORAL_TABLET | Freq: Three times a day (TID) | ORAL | 0 refills | Status: DC | PRN
Start: 2020-04-29 — End: 2020-06-03

## 2020-04-29 NOTE — Telephone Encounter (Signed)
Requested medication (s) are due for refill today: yes  Requested medication (s) are on the active medication list: yes  Last refill:03/25/20  #60  0 refills  Future visit scheduled 05/02/20  Nurse Health Provider  Notes to clinic: not delegated  Requested Prescriptions  Pending Prescriptions Disp Refills   HYDROcodone-acetaminophen (NORCO) 10-325 MG tablet 60 tablet 0    Sig: Take 1 tablet by mouth every 8 (eight) hours as needed.      Not Delegated - Analgesics:  Opioid Agonist Combinations Failed - 04/29/2020  2:47 PM      Failed - This refill cannot be delegated      Failed - Urine Drug Screen completed in last 360 days.      Passed - Valid encounter within last 6 months    Recent Outpatient Visits           2 weeks ago Syncope, unspecified syncope type   [place], [place], [place]   1 month ago Insomnia, unspecified type   [facility] Birdie Sons, MD   3 months ago Seborrhea capitis   [facility] Birdie Sons, MD   4 months ago CAD- PCI to RCA 08/10/11, CABG in [place] 5/14   [facility] Birdie Sons, MD   9 months ago Acute non-recurrent sinusitis, unspecified location   [place], Kelby Aline, FNP

## 2020-04-29 NOTE — Telephone Encounter (Signed)
Copied from [place] [id number]. Topic: Quick Communication - Rx Refill/Question >> Apr 29, 2020  2:37 PM Mcneil, Ja-Kwan wrote: Medication: HYDROcodone-acetaminophen (NORCO) 10-325 MG tablet  Has the patient contacted their pharmacy? no  Preferred Pharmacy (with phone number or street name): [id number] - [place], [place] Phone: [phone]  Fax: [phone]  Agent: Please be advised that RX refills may take up to 3 business days. We ask that you follow-up with your pharmacy.

## 2020-05-01 ENCOUNTER — Other Ambulatory Visit: Payer: Self-pay

## 2020-05-01 ENCOUNTER — Encounter: Payer: Self-pay | Admitting: Internal Medicine

## 2020-05-01 ENCOUNTER — Ambulatory Visit (INDEPENDENT_AMBULATORY_CARE_PROVIDER_SITE_OTHER): Payer: PPO | Admitting: Internal Medicine

## 2020-05-01 VITALS — BP 116/80 | HR 68 | Ht 70.0 in | Wt 187.6 lb

## 2020-05-01 DIAGNOSIS — E039 Hypothyroidism, unspecified: Secondary | ICD-10-CM
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
## 2020-05-01 NOTE — Patient Instructions (Signed)
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
## 2020-05-02 ENCOUNTER — Ambulatory Visit (INDEPENDENT_AMBULATORY_CARE_PROVIDER_SITE_OTHER): Payer: PPO

## 2020-05-02 DIAGNOSIS — R55 Syncope and collapse: Secondary | ICD-10-CM | POA: Insufficient documentation

## 2020-05-02 DIAGNOSIS — Z0000 Encounter for general adult medical examination without abnormal findings: Secondary | ICD-10-CM | POA: Diagnosis not present

## 2020-05-02 DIAGNOSIS — I5022 Chronic systolic (congestive) heart failure: Secondary | ICD-10-CM | POA: Insufficient documentation

## 2020-05-02 NOTE — Patient Instructions (Signed)
[name] , Thank you for taking time to come for your Medicare Wellness Visit. I appreciate your ongoing commitment to your health goals. Please review the following plan we discussed and let me know if I can assist you in the future.   Screening recommendations/referrals: Colonoscopy: Cologuard completed, due 04/2022 Recommended yearly ophthalmology/optometry visit for glaucoma screening and checkup Recommended yearly dental visit for hygiene and checkup  Vaccinations: Influenza vaccine: Currently due, pt to receive at pharmacy.  Tdap vaccine: Up to date, due 05/2027 Shingles vaccine: Shingrix discussed. Please contact your pharmacy for coverage information.     Advanced directives: Currently on file.  Conditions/risks identified: Fall risk preventatives discussed today.   Next appointment: None. Declined scheduling a follow up with PCP or an AWV for 2022 at this time.   Preventive Care 9 Years and Older, Male Preventive care refers to lifestyle choices and visits with your health care provider that can promote health and wellness. What does preventive care include?  A yearly physical exam. This is also called an annual well check.  Dental exams once or twice a year.  Routine eye exams. Ask your health care provider how often you should have your eyes checked.  Personal lifestyle choices, including:  Daily care of your teeth and gums.  Regular physical activity.  Eating a healthy diet.  Avoiding tobacco and drug use.  Limiting alcohol use.  Practicing safe sex.  Taking low doses of aspirin every day.  Taking vitamin and mineral supplements as recommended by your health care provider. What happens during an annual well check? The services and screenings done by your health care provider during your annual well check will depend on your age, overall health, lifestyle risk factors, and family history of disease. Counseling  Your health care provider may ask you  questions about your:  Alcohol use.  Tobacco use.  Drug use.  Emotional well-being.  Home and relationship well-being.  Sexual activity.  Eating habits.  History of falls.  Memory and ability to understand (cognition).  Work and work Statistician. Screening  You may have the following tests or measurements:  Height, weight, and BMI.  Blood pressure.  Lipid and cholesterol levels. These may be checked every 5 years, or more frequently if you are over 50 years old.  Skin check.  Lung cancer screening. You may have this screening every year starting at age 46 if you have a 30-pack-year history of smoking and currently smoke or have quit within the past 15 years.  Fecal occult blood test (FOBT) of the stool. You may have this test every year starting at age 34.  Flexible sigmoidoscopy or colonoscopy. You may have a sigmoidoscopy every 5 years or a colonoscopy every 10 years starting at age 24.  Prostate cancer screening. Recommendations will vary depending on your family history and other risks.  Hepatitis C blood test.  Hepatitis B blood test.  Sexually transmitted disease (STD) testing.  Diabetes screening. This is done by checking your blood sugar (glucose) after you have not eaten for a while (fasting). You may have this done every 1-3 years.  Abdominal aortic aneurysm (AAA) screening. You may need this if you are a current or former smoker.  Osteoporosis. You may be screened starting at age 58 if you are at high risk. Talk with your health care provider about your test results, treatment options, and if necessary, the need for more tests. Vaccines  Your health care provider may recommend certain vaccines, such as:  Influenza vaccine. This is recommended every year.  Tetanus, diphtheria, and acellular pertussis (Tdap, Td) vaccine. You may need a Td booster every 10 years.  Zoster vaccine. You may need this after age 68.  Pneumococcal 13-valent conjugate  (PCV13) vaccine. One dose is recommended after age 20.  Pneumococcal polysaccharide (PPSV23) vaccine. One dose is recommended after age 78. Talk to your health care provider about which screenings and vaccines you need and how often you need them. This information is not intended to replace advice given to you by your health care provider. Make sure you discuss any questions you have with your health care provider. Document Released: 08/16/2015 Document Revised: 04/08/2016 Document Reviewed: 05/21/2015 Elsevier Interactive Patient Education  2017 [place] Prevention in the Home Falls can cause injuries. They can happen to people of all ages. There are many things you can do to make your home safe and to help prevent falls. What can I do on the outside of my home?  Regularly fix the edges of walkways and driveways and fix any cracks.  Remove anything that might make you trip as you walk through a door, such as a raised step or threshold.  Trim any bushes or trees on the path to your home.  Use bright outdoor lighting.  Clear any walking paths of anything that might make someone trip, such as rocks or tools.  Regularly check to see if handrails are loose or broken. Make sure that both sides of any steps have handrails.  Any raised decks and porches should have guardrails on the edges.  Have any leaves, snow, or ice cleared regularly.  Use sand or salt on walking paths during winter.  Clean up any spills in your garage right away. This includes oil or grease spills. What can I do in the bathroom?  Use night lights.  Install grab bars by the toilet and in the tub and shower. Do not use towel bars as grab bars.  Use non-skid mats or decals in the tub or shower.  If you need to sit down in the shower, use a plastic, non-slip stool.  Keep the floor dry. Clean up any water that spills on the floor as soon as it happens.  Remove soap buildup in the tub or shower  regularly.  Attach bath mats securely with double-sided non-slip rug tape.  Do not have throw rugs and other things on the floor that can make you trip. What can I do in the bedroom?  Use night lights.  Make sure that you have a light by your bed that is easy to reach.  Do not use any sheets or blankets that are too big for your bed. They should not hang down onto the floor.  Have a firm chair that has side arms. You can use this for support while you get dressed.  Do not have throw rugs and other things on the floor that can make you trip. What can I do in the kitchen?  Clean up any spills right away.  Avoid walking on wet floors.  Keep items that you use a lot in easy-to-reach places.  If you need to reach something above you, use a strong step stool that has a grab bar.  Keep electrical cords out of the way.  Do not use floor polish or wax that makes floors slippery. If you must use wax, use non-skid floor wax.  Do not have throw rugs and other things on the floor that  can make you trip. What can I do with my stairs?  Do not leave any items on the stairs.  Make sure that there are handrails on both sides of the stairs and use them. Fix handrails that are broken or loose. Make sure that handrails are as long as the stairways.  Check any carpeting to make sure that it is firmly attached to the stairs. Fix any carpet that is loose or worn.  Avoid having throw rugs at the top or bottom of the stairs. If you do have throw rugs, attach them to the floor with carpet tape.  Make sure that you have a light switch at the top of the stairs and the bottom of the stairs. If you do not have them, ask someone to add them for you. What else can I do to help prevent falls?  Wear shoes that:  Do not have high heels.  Have rubber bottoms.  Are comfortable and fit you well.  Are closed at the toe. Do not wear sandals.  If you use a stepladder:  Make sure that it is fully  opened. Do not climb a closed stepladder.  Make sure that both sides of the stepladder are locked into place.  Ask someone to hold it for you, if possible.  Clearly mark and make sure that you can see:  Any grab bars or handrails.  First and last steps.  Where the edge of each step is.  Use tools that help you move around (mobility aids) if they are needed. These include:  Canes.  Walkers.  Scooters.  Crutches.  Turn on the lights when you go into a dark area. Replace any light bulbs as soon as they burn out.  Set up your furniture so you have a clear path. Avoid moving your furniture around.  If any of your floors are uneven, fix them.  If there are any pets around you, be aware of where they are.  Review your medicines with your doctor. Some medicines can make you feel dizzy. This can increase your chance of falling. Ask your doctor what other things that you can do to help prevent falls. This information is not intended to replace advice given to you by your health care provider. Make sure you discuss any questions you have with your health care provider. Document Released: 05/16/2009 Document Revised: 12/26/2015 Document Reviewed: 08/24/2014 Elsevier Interactive Patient Education  2017 [employer].

## 2020-05-03 ENCOUNTER — Encounter: Payer: Self-pay | Admitting: Internal Medicine

## 2020-05-03 ENCOUNTER — Other Ambulatory Visit: Payer: Self-pay

## 2020-05-03 ENCOUNTER — Ambulatory Visit (INDEPENDENT_AMBULATORY_CARE_PROVIDER_SITE_OTHER): Payer: PPO | Admitting: Internal Medicine

## 2020-05-03 VITALS — BP 98/68 | HR 54 | Ht 70.0 in | Wt 186.6 lb

## 2020-05-03 DIAGNOSIS — I255 Ischemic cardiomyopathy: Secondary | ICD-10-CM | POA: Diagnosis not present

## 2020-05-03 DIAGNOSIS — I472 Ventricular tachycardia, unspecified: Secondary | ICD-10-CM

## 2020-05-03 DIAGNOSIS — Z95810 Presence of automatic (implantable) cardiac defibrillator: Secondary | ICD-10-CM

## 2020-05-03 DIAGNOSIS — I5022 Chronic systolic (congestive) heart failure: Secondary | ICD-10-CM

## 2020-05-03 DIAGNOSIS — E032 Hypothyroidism due to medicaments and other exogenous substances: Secondary | ICD-10-CM

## 2020-05-03 DIAGNOSIS — R55 Syncope and collapse: Secondary | ICD-10-CM | POA: Diagnosis not present

## 2020-05-03 LAB — T4, FREE: Free T4: 1.75 ng/dL (ref 0.82–1.77)

## 2020-05-03 LAB — TSH: TSH: 5.98 u[IU]/mL — ABNORMAL HIGH (ref 0.450–4.500)

## 2020-05-03 LAB — T3, FREE: T3, Free: 2.2 pg/mL (ref 2.0–4.4)

## 2020-05-03 NOTE — Patient Instructions (Signed)
Medication Instructions:  Your physician has recommended you make the following change in your medication:   ** Stop Metoprolol  *If you need a refill on your cardiac medications before your next appointment, please call your pharmacy*   Lab Work: TSH, Free T3, Free T4 today  If you have labs (blood work) drawn today and your tests are completely normal, you will receive your results only by: Marland Kitchen MyChart Message (if you have MyChart) OR . A paper copy in the mail If you have any lab test that is abnormal or we need to change your treatment, we will call you to review the results.   Testing/Procedures: None ordered.   Follow-Up: At [facility], you and your health needs are our priority.  As part of our continuing mission to provide you with exceptional heart care, we have created designated Provider Care Teams.  These Care Teams include your primary Cardiologist (physician) and Advanced Practice Providers (APPs -  Physician Assistants and Nurse Practitioners) who all work together to provide you with the care you need, when you need it.  We recommend signing up for the patient portal called "MyChart".  Sign up information is provided on this After Visit Summary.  MyChart is used to connect with patients for Virtual Visits (Telemedicine).  Patients are able to view lab/test results, encounter notes, upcoming appointments, etc.  Non-urgent messages can be sent to your provider as well.   To learn more about what you can do with MyChart, go to [email or web address].    Your next appointment:   3 month(s)  The format for your next appointment:   In Person  Provider:   Virl Axe, MD
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
## 2020-05-06 ENCOUNTER — Telehealth: Payer: Self-pay | Admitting: Internal Medicine

## 2020-05-06 MED ORDER — LEVOTHYROXINE SODIUM 88 MCG PO TABS: 88.0000 ug | ORAL_TABLET | Freq: Every day | ORAL | 3 refills | Status: DC

## 2020-05-06 NOTE — Telephone Encounter (Signed)
Left a message to stop levothyroxine 75 mcg and start levothyroxine 88 mcg daily   Results for SAL, SPRATLEY (MRN [id number]) as of 05/06/2020 13:28  Ref. Range 05/03/2020 11:40  TSH Latest Ref Range: 0.450 - 4.500 uIU/mL 5.980 (H)  Triiodothyronine,Free,Serum Latest Ref Range: 2.0 - 4.4 pg/mL 2.2  T4,Free(Direct) Latest Ref Range: 0.82 - 1.77 ng/dL 1.75    Abby Nena Jordan, MD  [facility] Endocrinology  [address] [place], [place] [address] Phone: Interior: [phone]

## 2020-05-09 ENCOUNTER — Telehealth: Payer: Self-pay

## 2020-05-09 NOTE — Telephone Encounter (Signed)
Spoke with pt and advised per Dr [name] labs are" pretty normal" but have been forwarded to Dr Kelton Pillar.  Pt verbalizes understanding and thanked Therapist, sports for call.

## 2020-05-09 NOTE — Telephone Encounter (Signed)
-----   Message from [name], MD sent at 05/04/2020  6:12 PM EDT ----- Please Inform Patient that labs are pretty normal  Thanks Forwarding to endocrine MD.. DR La Homa

## 2020-05-27 ENCOUNTER — Encounter (HOSPITAL_COMMUNITY): Payer: PPO | Attending: Nurse Practitioner

## 2020-05-27 ENCOUNTER — Other Ambulatory Visit: Payer: Self-pay

## 2020-05-27 ENCOUNTER — Encounter (HOSPITAL_COMMUNITY): Payer: PPO

## 2020-05-27 DIAGNOSIS — I472 Ventricular tachycardia, unspecified: Secondary | ICD-10-CM

## 2020-05-27 DIAGNOSIS — I5042 Chronic combined systolic (congestive) and diastolic (congestive) heart failure: Secondary | ICD-10-CM

## 2020-05-27 DIAGNOSIS — Z01812 Encounter for preprocedural laboratory examination: Secondary | ICD-10-CM | POA: Diagnosis not present

## 2020-05-30 ENCOUNTER — Telehealth: Payer: Self-pay | Admitting: Internal Medicine

## 2020-05-30 NOTE — Telephone Encounter (Signed)
Patient calling to check on the status of his results from test done on Monday 05/27/2020. Please call/advise.   Thank you!

## 2020-05-30 NOTE — Telephone Encounter (Signed)
Advised Pt study had not been resulted yet.  Advised may not be ready until beginning of next week.  Pt indicates understanding.

## 2020-06-03 ENCOUNTER — Other Ambulatory Visit: Payer: Self-pay | Admitting: Family Medicine

## 2020-06-03 DIAGNOSIS — M5489 Other dorsalgia: Secondary | ICD-10-CM

## 2020-06-03 MED ORDER — HYDROCODONE-ACETAMINOPHEN 10-325 MG PO TABS: 1.0000 | ORAL_TABLET | Freq: Three times a day (TID) | ORAL | 0 refills | Status: DC | PRN

## 2020-06-03 NOTE — Telephone Encounter (Signed)
Medication Refill - Medication: Hydrocodone 10/325  Has the patient contacted their pharmacy? No. (Agent: If no, request that the patient contact the pharmacy for the refill.) (Agent: If yes, when and what did the pharmacy advise?)  Preferred Pharmacy [facility] phone number [id number] [place] [place]: Please be advised that RX refills may take up to 3 business days. We ask that you follow-up with your pharmacy.

## 2020-06-03 NOTE — Telephone Encounter (Signed)
Please review for refill. Refill not delegated per protocol.  Last refill 04/29/20 # 60 Next OV: 06/14/20

## 2020-06-07 ENCOUNTER — Ambulatory Visit (INDEPENDENT_AMBULATORY_CARE_PROVIDER_SITE_OTHER): Payer: PPO

## 2020-06-07 DIAGNOSIS — I472 Ventricular tachycardia, unspecified: Secondary | ICD-10-CM

## 2020-06-07 LAB — CUP PACEART REMOTE DEVICE CHECK
Battery Remaining Longevity: 52 mo
Battery Voltage: 2.98 V
Brady Statistic RV Percent Paced: 0.08 %
Date Time Interrogation Session: 20211105063523
HighPow Impedance: 79 Ohm
Implantable Lead Implant Date: 20150318
Implantable Lead Location: 753860
Implantable Lead Model: 181
Implantable Lead Serial Number: 327195
Implantable Pulse Generator Implant Date: 20150318
Lead Channel Impedance Value: 551 Ohm
Lead Channel Impedance Value: 589 Ohm
Lead Channel Pacing Threshold Amplitude: 1 V
Lead Channel Pacing Threshold Pulse Width: 0.4 ms
Lead Channel Sensing Intrinsic Amplitude: 7.875 mV
Lead Channel Sensing Intrinsic Amplitude: 7.875 mV
Lead Channel Setting Pacing Amplitude: 2 V
Lead Channel Setting Pacing Pulse Width: 0.4 ms
Lead Channel Setting Sensing Sensitivity: 0.3 mV

## 2020-06-07 NOTE — Progress Notes (Signed)
Remote ICD transmission.   

## 2020-06-07 NOTE — Telephone Encounter (Signed)
Pt following up on results

## 2020-06-10 NOTE — Telephone Encounter (Signed)
LMTCB for CPX results and recommendations

## 2020-06-10 NOTE — Telephone Encounter (Signed)
Pt is aware and agreeable to CPX results  pt was instructed to d/c metop but he has continued to take it on the days he is active or hiking because he states he is afraid that his heart rate will increase to much and send him into an arhythmia.  Pt is agreeable to follow up with Dr. [name] in 12/9 to discuss other plans for treatment of chronotropic incompetence.

## 2020-06-11 DIAGNOSIS — I129 Hypertensive chronic kidney disease with stage 1 through stage 4 chronic kidney disease, or unspecified chronic kidney disease: Secondary | ICD-10-CM | POA: Diagnosis not present

## 2020-06-11 DIAGNOSIS — N184 Chronic kidney disease, stage 4 (severe): Secondary | ICD-10-CM | POA: Diagnosis not present

## 2020-06-11 DIAGNOSIS — N2581 Secondary hyperparathyroidism of renal origin: Secondary | ICD-10-CM | POA: Diagnosis not present

## 2020-06-14 ENCOUNTER — Encounter: Payer: Self-pay | Admitting: Family Medicine

## 2020-06-14 ENCOUNTER — Other Ambulatory Visit: Payer: Self-pay

## 2020-06-14 ENCOUNTER — Ambulatory Visit (INDEPENDENT_AMBULATORY_CARE_PROVIDER_SITE_OTHER): Payer: PPO | Admitting: Family Medicine

## 2020-06-14 VITALS — BP 114/79 | HR 85 | Temp 98.5°F | Resp 16 | Wt 192.0 lb

## 2020-06-14 DIAGNOSIS — F321 Major depressive disorder, single episode, moderate: Secondary | ICD-10-CM | POA: Diagnosis not present

## 2020-06-14 DIAGNOSIS — E039 Hypothyroidism, unspecified: Secondary | ICD-10-CM

## 2020-06-14 MED ORDER — BUPROPION HCL ER (SR) 150 MG PO TB12: 150.0000 mg | ORAL_TABLET | Freq: Two times a day (BID) | ORAL | 1 refills | Status: DC

## 2020-06-14 NOTE — Progress Notes (Signed)
Established patient visit   Patient: Richard Cook   DOB: [birth year]   64 y.o. Male  MRN: [id number] Visit Date: 06/14/2020  Today's healthcare provider: Lelon Huh, MD   Chief Complaint  Patient presents with  . Depression   Subjective    Depression        This is a new problem.  Episode onset: 2 weeks ago.   The onset quality is gradual.   The problem has been gradually worsening since onset.  Associated symptoms include helplessness and decreased interest.  Associated symptoms include no appetite change.  Past treatments include nothing.  Patient states he has had a loss of interest in things he usually enjoys. Doesn't feel like getting out of bed in the morning. Has no motivation. Feels fatigued.    Depression screen [facility] 2/9 06/14/2020 05/02/2020 06/15/2019  Decreased Interest 3 0 0  Down, Depressed, Hopeless 3 0 0  PHQ - 2 Score 6 0 0  Altered sleeping 3 - -  Tired, decreased energy 3 - -  Change in appetite 3 - -  Feeling bad or failure about yourself  0 - -  Trouble concentrating 0 - -  Moving slowly or fidgety/restless 0 - -  Suicidal thoughts 0 - -  PHQ-9 Score 15 - -  Difficult doing work/chores Not difficult at all - -  Some recent data might be hidden   He would also like to follow up on hypothyroid. He saw an endocrinologist at the end of September and TSH was 5.980, was increased from 66mcg to 88 mcg levothyroxine.      Medications: Outpatient Medications Prior to Visit  Medication Sig  . allopurinol (ZYLOPRIM) 300 MG tablet TAKE 1 TABLET BY MOUTH EVERY DAY  . amiodarone (PACERONE) 200 MG tablet Take 1 tablet (200 mg) by mouth Monday - Friday. DO NOT TAKE ON SAT OR SUNDAY  . aspirin EC 81 MG tablet Take 81 mg by mouth daily.  Marland Kitchen BEE POLLEN PO Take 1 capsule by mouth daily.   . bumetanide (BUMEX) 2 MG tablet Take 2 mg by mouth daily.  . clonazePAM (KLONOPIN) 1 MG tablet TAKE ONE TABLET EVERY AFTERNOON AND 1 & 1/2 TABLET AT BEDTIME  .  furosemide (LASIX) 40 MG tablet TAKE 1 TABLET (40 MG TOTAL) BY MOUTH DAILY AS NEEDED FOR FLUID.  Marland Kitchen HYDROcodone-acetaminophen (NORCO) 10-325 MG tablet Take 1 tablet by mouth every 8 (eight) hours as needed.  . hydrocortisone 1 % lotion Apply 1 application topically 2 (two) times daily.  . hydrOXYzine (ATARAX/VISTARIL) 25 MG tablet TAKE 1-2 TABLETS BY MOUTH AT BEDTIME AS NEEDED.  Marland Kitchen levothyroxine (SYNTHROID) 88 MCG tablet Take 1 tablet (88 mcg total) by mouth daily.  Marland Kitchen linaclotide (LINZESS) 72 MCG capsule Take 1 capsule (72 mcg total) by mouth daily before breakfast.  . metolazone (ZAROXOLYN) 2.5 MG tablet Take 2.5 mg by mouth as needed.  . mexiletine (MEXITIL) 250 MG capsule Take 1 capsule (250 mg total) by mouth 3 (three) times daily.  . mupirocin ointment (BACTROBAN) 2 % Apply 1 application topically 2 (two) times daily. Right nare apply small amount.  . Omega-3 Fatty Acids (FISH OIL PO) Take 1 capsule by mouth daily.  . rosuvastatin (CRESTOR) 40 MG tablet Take 1 tablet (40 mg total) by mouth daily.   No facility-administered medications prior to visit.    Review of Systems  Constitutional: Negative for appetite change, chills and fever.  Respiratory: Negative for chest tightness, shortness of  breath and wheezing.   Cardiovascular: Negative for chest pain and palpitations.  Gastrointestinal: Negative for abdominal pain, nausea and vomiting.  Psychiatric/Behavioral: Positive for depression, dysphoric mood and sleep disturbance. The patient is nervous/anxious.       Objective    BP 114/79 (BP Location: Right Arm, Patient Position: Sitting, Cuff Size: Large)   Pulse 85   Temp 98.5 F (36.9 C) (Oral)   Resp 16   Wt 192 lb (87.1 kg)   BMI 27.55 kg/m    Physical Exam    General: Appearance:     Well developed, well nourished male in no acute distress  Eyes:    PERRL, conjunctiva/corneas clear, EOM's intact       Lungs:     Clear to auscultation bilaterally, respirations unlabored    Heart:    Normal heart rate. Normal rhythm. No murmurs, rubs, or gallops.   MS:   All extremities are intact.   Neurologic:   Awake, alert, oriented x 3. No apparent focal neurological           defect.          Assessment & Plan     1. Depression, major, single episode, moderate (HCC) Start - buPROPion (WELLBUTRIN SR) 150 MG 12 hr tablet; Take 1 tablet (150 mg total) by mouth 2 (two) times daily. Take one tablet every morning for 3 days, then increase to twice a day  Dispense: 60 tablet; Refill: 1  2. Hypothyroidism, unspecified type On 88 mcg levothyroxine since early October.  - T4, free - TSH         The entirety of the information documented in the History of Present Illness, Review of Systems and Physical Exam were personally obtained by me. Portions of this information were initially documented by the CMA and reviewed by me for thoroughness and accuracy.      Lelon Huh, MD  [facility] [phone] (phone) [phone] (fax)  [place]

## 2020-06-15 LAB — T4, FREE: Free T4: 1.81 ng/dL — ABNORMAL HIGH (ref 0.82–1.77)

## 2020-06-15 LAB — TSH: TSH: 2.05 u[IU]/mL (ref 0.450–4.500)

## 2020-06-18 ENCOUNTER — Encounter (HOSPITAL_COMMUNITY): Payer: PPO

## 2020-06-18 ENCOUNTER — Telehealth: Payer: Self-pay

## 2020-06-18 NOTE — Telephone Encounter (Signed)
-----   Message from Birdie Sons, MD sent at 06/15/2020  8:14 AM EST ----- Thyroid level is very slightly high. I recommend continue the same dose of the levothyroxine and recheck in 3-4 months.

## 2020-06-18 NOTE — Telephone Encounter (Signed)
Patient advised and verbalized understanding. Patient says he will be seeing a thyroid specialist in January 2022.

## 2020-06-19 DIAGNOSIS — D631 Anemia in chronic kidney disease: Secondary | ICD-10-CM | POA: Diagnosis not present

## 2020-06-19 DIAGNOSIS — I129 Hypertensive chronic kidney disease with stage 1 through stage 4 chronic kidney disease, or unspecified chronic kidney disease: Secondary | ICD-10-CM | POA: Diagnosis not present

## 2020-06-19 DIAGNOSIS — N2581 Secondary hyperparathyroidism of renal origin: Secondary | ICD-10-CM | POA: Diagnosis not present

## 2020-06-19 DIAGNOSIS — N184 Chronic kidney disease, stage 4 (severe): Secondary | ICD-10-CM | POA: Diagnosis not present

## 2020-07-02 ENCOUNTER — Other Ambulatory Visit: Payer: Self-pay | Admitting: Family Medicine

## 2020-07-02 ENCOUNTER — Telehealth: Payer: Self-pay | Admitting: Family Medicine

## 2020-07-02 DIAGNOSIS — F411 Generalized anxiety disorder: Secondary | ICD-10-CM

## 2020-07-02 NOTE — Telephone Encounter (Signed)
Requested medication (s) are due for refill today: yes   Requested medication (s) are on the active medication list: yes   Last refill: 05/31/2020  Future visit scheduled: no  Notes to clinic:  this refill cannot be delegated    Requested Prescriptions  Pending Prescriptions Disp Refills   clonazePAM (KLONOPIN) 1 MG tablet [Pharmacy Med Name: CLONAZEPAM 1 MG TABLET] 75 tablet 3    Sig: TAKE ONE TABLET EVERY AFTERNOON AND 1 & 1/2 TABLET AT BEDTIME      Not Delegated - Psychiatry:  Anxiolytics/Hypnotics Failed - 07/02/2020 10:22 AM      Failed - This refill cannot be delegated      Failed - Urine Drug Screen completed in last 360 days      Passed - Valid encounter within last 6 months    Recent Outpatient Visits           2 weeks ago Depression, major, single episode, moderate ([place])   [facility] Birdie Sons, MD   2 months ago Syncope, unspecified syncope type   [place], [place], PA-C   3 months ago Insomnia, unspecified type   [facility] Birdie Sons, MD   5 months ago Seborrhea capitis   [facility] Birdie Sons, MD   6 months ago CAD- PCI to RCA 08/10/11, CABG in [place] 5/14   [place], Kirstie Peri, MD

## 2020-07-02 NOTE — Telephone Encounter (Signed)
Recommend he go back down to one tablet a day instead of two. If still having trouble after a week, then we could change to something different.

## 2020-07-02 NOTE — Telephone Encounter (Signed)
Patient advised and verbalized understanding. Patient agrees with this plan.

## 2020-07-02 NOTE — Telephone Encounter (Signed)
Pt seen dr Caryn Section on 06-14-2020 and was prescribed bupropion for depression. Pt would like the dr Risk manager nurse to return has call. Pt states the medication is giving him anxiety. [facility] [address] [facility] [place]

## 2020-07-06 ENCOUNTER — Other Ambulatory Visit: Payer: Self-pay | Admitting: Family Medicine

## 2020-07-06 DIAGNOSIS — F321 Major depressive disorder, single episode, moderate: Secondary | ICD-10-CM

## 2020-07-08 ENCOUNTER — Other Ambulatory Visit: Payer: Self-pay | Admitting: Family Medicine

## 2020-07-08 DIAGNOSIS — M5489 Other dorsalgia: Secondary | ICD-10-CM

## 2020-07-08 MED ORDER — HYDROCODONE-ACETAMINOPHEN 10-325 MG PO TABS: 1.0000 | ORAL_TABLET | Freq: Three times a day (TID) | ORAL | 0 refills | Status: DC | PRN

## 2020-07-08 NOTE — Telephone Encounter (Signed)
Requested medication (s) are due for refill today: yes   Requested medication (s) are on the active medication list:yes   Last refill:  06/03/2020  Future visit scheduled: no  Notes to clinic:  this refill cannot be delegated    Requested Prescriptions  Pending Prescriptions Disp Refills   HYDROcodone-acetaminophen (NORCO) 10-325 MG tablet 60 tablet 0    Sig: Take 1 tablet by mouth every 8 (eight) hours as needed.      Not Delegated - Analgesics:  Opioid Agonist Combinations Failed - 07/08/2020 11:28 AM      Failed - This refill cannot be delegated      Failed - Urine Drug Screen completed in last 360 days      Passed - Valid encounter within last 6 months    Recent Outpatient Visits           3 weeks ago Depression, major, single episode, moderate ([place])   [facility] Birdie Sons, MD   2 months ago Syncope, unspecified syncope type   [place], O'Brien, PA-C   3 months ago Insomnia, unspecified type   [facility] Birdie Sons, MD   5 months ago Seborrhea capitis   [facility] Birdie Sons, MD   6 months ago CAD- PCI to RCA 08/10/11, CABG in [place] 5/14   [place], Kirstie Peri, MD

## 2020-07-08 NOTE — Telephone Encounter (Signed)
HYDROcodone-acetaminophen (NORCO) 10-325 MG tablet Medication Date: 06/03/2020 Department: [place] Ordering/Authorizing: Birdie Sons, MD   [id number] [address], [place] Phone:  [phone]  Fax:  [phone]     Please refill

## 2020-07-10 ENCOUNTER — Ambulatory Visit: Payer: Self-pay

## 2020-07-10 ENCOUNTER — Other Ambulatory Visit: Payer: Self-pay | Admitting: Family Medicine

## 2020-07-10 DIAGNOSIS — F321 Major depressive disorder, single episode, moderate: Secondary | ICD-10-CM

## 2020-07-10 MED ORDER — VENLAFAXINE HCL ER 37.5 MG PO CP24: 37.5000 mg | ORAL_CAPSULE | Freq: Every day | ORAL | 1 refills | Status: DC

## 2020-07-10 NOTE — Telephone Encounter (Signed)
Patient advised and verbalized understanding. Patient agrees with treatment plan below.

## 2020-07-10 NOTE — Addendum Note (Signed)
Addended by: Birdie Sons on: 07/10/2020 02:16 PM   Modules accepted: Orders

## 2020-07-10 NOTE — Telephone Encounter (Signed)
Returned call to patient who states that he has been taking Bupropion for depression and started to have anxiety. He through a message to office decreased his dose as instructed by Dr Caryn Section. He states that it has been a week and he still feels the anxiety. He has no thought of suicide or homicide. He states it is hard to sleep, and he feels very anxious "out of body" feeling.  He was offered appointment but refused stating that Dr Caryn Section usually just sends something.  Per protocol office was consulted. Will send message per patient request.  Reason for Disposition . [1] Caller has URGENT medicine question about med that PCP or specialist prescribed AND [2] triager unable to answer question  Answer Assessment - Initial Assessment Questions 1. NAME of MEDICATION: "What medicine are you calling about?"     bupropion 2. QUESTION: "What is your question?" (e.g., medication refill, side effect)     Still giving anxiey 3. PRESCRIBING HCP: "Who prescribed it?" Reason: if prescribed by specialist, call should be referred to that group.    Dr Caryn Section 4. SYMPTOMS: "Do you have any symptoms?"     anxiety 5. SEVERITY: If symptoms are present, ask "Are they mild, moderate or severe?"     Interferes with sleep feels very anxious out of body 6. PREGNANCY:  "Is there any chance that you are pregnant?" "When was your last menstrual period?"     N/A  Protocols used: MEDICATION QUESTION CALL-A-AH

## 2020-07-10 NOTE — Telephone Encounter (Signed)
Will change to venlafaxine. Have sent order to his pharmacy. Can just stop the bupropion. Does not have to wean.

## 2020-07-11 ENCOUNTER — Other Ambulatory Visit: Payer: Self-pay

## 2020-07-11 ENCOUNTER — Telehealth (INDEPENDENT_AMBULATORY_CARE_PROVIDER_SITE_OTHER): Payer: PPO | Admitting: Internal Medicine

## 2020-07-11 VITALS — Ht 70.0 in | Wt 185.0 lb

## 2020-07-11 DIAGNOSIS — I255 Ischemic cardiomyopathy: Secondary | ICD-10-CM

## 2020-07-11 DIAGNOSIS — I4589 Other specified conduction disorders: Secondary | ICD-10-CM | POA: Diagnosis not present

## 2020-07-11 DIAGNOSIS — Z95810 Presence of automatic (implantable) cardiac defibrillator: Secondary | ICD-10-CM | POA: Diagnosis not present

## 2020-07-11 DIAGNOSIS — I5022 Chronic systolic (congestive) heart failure: Secondary | ICD-10-CM | POA: Diagnosis not present

## 2020-07-11 DIAGNOSIS — I472 Ventricular tachycardia, unspecified: Secondary | ICD-10-CM
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
## 2020-07-11 NOTE — Patient Instructions (Signed)
Medication Instructions:   Your physician recommends that you continue on your current medications as directed. Please refer to the Current Medication list given to you today.   If you need a refill on your cardiac medications before your next appointment, please call your pharmacy*   Lab Work: None ordered.  If you have labs (blood work) drawn today and your tests are completely normal, you will receive your results only by: Marland Kitchen MyChart Message (if you have MyChart) OR . A paper copy in the mail If you have any lab test that is abnormal or we need to change your treatment, we will call you to review the results.   Testing/Procedures: Your physician has requested that you have an exercise tolerance test. For further information please visit [email or web address]. Please also follow instruction sheet, as given.  Your physician has requested that you have an echocardiogram. Echocardiography is a painless test that uses sound waves to create images of your heart. It provides your doctor with information about the size and shape of your heart and how well your heart's chambers and valves are working. This procedure takes approximately one hour. There are no restrictions for this procedure.  Follow-Up: At [facility], you and your health needs are our priority.  As part of our continuing mission to provide you with exceptional heart care, we have created designated Provider Care Teams.  These Care Teams include your primary Cardiologist (physician) and Advanced Practice Providers (APPs -  Physician Assistants and Nurse Practitioners) who all work together to provide you with the care you need, when you need it.  We recommend signing up for the patient portal called "MyChart".  Sign up information is provided on this After Visit Summary.  MyChart is used to connect with patients for Virtual Visits (Telemedicine).  Patients are able to view lab/test results, encounter notes, upcoming appointments,  etc.  Non-urgent messages can be sent to your provider as well.   To learn more about what you can do with MyChart, go to [email or web address].    Your next appointment:   6 month(s)  The format for your next appointment:   In Person  Provider:   Virl Axe, MD

## 2020-07-24 DIAGNOSIS — N2581 Secondary hyperparathyroidism of renal origin: Secondary | ICD-10-CM | POA: Diagnosis not present

## 2020-07-24 DIAGNOSIS — N184 Chronic kidney disease, stage 4 (severe): Secondary | ICD-10-CM | POA: Diagnosis not present

## 2020-07-24 DIAGNOSIS — I129 Hypertensive chronic kidney disease with stage 1 through stage 4 chronic kidney disease, or unspecified chronic kidney disease: Secondary | ICD-10-CM | POA: Diagnosis not present

## 2020-07-31 ENCOUNTER — Telehealth: Payer: Self-pay

## 2020-07-31 DIAGNOSIS — N2581 Secondary hyperparathyroidism of renal origin: Secondary | ICD-10-CM | POA: Diagnosis not present

## 2020-07-31 DIAGNOSIS — D631 Anemia in chronic kidney disease: Secondary | ICD-10-CM | POA: Diagnosis not present

## 2020-07-31 DIAGNOSIS — I129 Hypertensive chronic kidney disease with stage 1 through stage 4 chronic kidney disease, or unspecified chronic kidney disease: Secondary | ICD-10-CM | POA: Diagnosis not present

## 2020-07-31 DIAGNOSIS — N184 Chronic kidney disease, stage 4 (severe): Secondary | ICD-10-CM | POA: Diagnosis not present

## 2020-07-31 NOTE — Telephone Encounter (Addendum)
Carelink alert received- Optivol level elevated in November.  Thoracic impendence declining.  Spoke with pt at length regarding fluid retention and symptoms.  Pt denies any new or recent increase in SOB.  Pt is scheduled for Echo and Stress test in coming weeks.    Current meds include PRN doses ofboth Furosemide and Metolazone.  Pt dfenies taking either medication recently.  Advised pt he should take Furosemide for the next couple of days.

## 2020-08-01 ENCOUNTER — Telehealth: Payer: Self-pay | Admitting: Family Medicine

## 2020-08-01 DIAGNOSIS — R2689 Other abnormalities of gait and mobility: Secondary | ICD-10-CM

## 2020-08-01 DIAGNOSIS — W19XXXA Unspecified fall, initial encounter: Secondary | ICD-10-CM

## 2020-08-01 NOTE — Telephone Encounter (Signed)
Please advise patient we heard from Dr. Juleen China about his having a fall and trouble balance. Have put in a referral to neurology to evaluate.

## 2020-08-01 NOTE — Telephone Encounter (Signed)
Patient returned call and was read message from Dr Caryn Section. He verbalized understanding and understands that the neurology office will be in touch to schedule him.

## 2020-08-01 NOTE — Telephone Encounter (Signed)
-----   Message from Lavonia Dana, MD sent at 07/31/2020  2:48 PM EST ----- Gentlemen,  I saw our mutual patient today. He is having an unsteady gait.He reports falling. I made him walk during his visit today. I am wondering if he has cerebral ataxia. Do you think a neurology consultation would be appropriate?   SCK

## 2020-08-01 NOTE — Telephone Encounter (Signed)
Tried calling patient. Left message to call back. OK for [facility] triage to advise of message below.

## 2020-08-02 ENCOUNTER — Other Ambulatory Visit: Payer: Self-pay | Admitting: Family Medicine

## 2020-08-02 DIAGNOSIS — F321 Major depressive disorder, single episode, moderate: Secondary | ICD-10-CM

## 2020-08-05 ENCOUNTER — Ambulatory Visit: Payer: PPO | Admitting: Internal Medicine

## 2020-08-05 ENCOUNTER — Other Ambulatory Visit: Payer: Self-pay

## 2020-08-05 ENCOUNTER — Encounter: Payer: Self-pay | Admitting: Internal Medicine

## 2020-08-05 ENCOUNTER — Telehealth: Payer: Self-pay | Admitting: Family Medicine

## 2020-08-05 VITALS — BP 126/80 | HR 64 | Ht 70.0 in | Wt 187.2 lb

## 2020-08-05 DIAGNOSIS — E039 Hypothyroidism, unspecified: Secondary | ICD-10-CM | POA: Diagnosis not present

## 2020-08-05 DIAGNOSIS — M109 Gout, unspecified: Secondary | ICD-10-CM

## 2020-08-05 LAB — TSH: TSH: 0.81 u[IU]/mL (ref 0.35–4.50)

## 2020-08-05 NOTE — Telephone Encounter (Signed)
Patient is calling to check on the status of the prednisone refill.

## 2020-08-05 NOTE — Telephone Encounter (Signed)
Please review. Ok to fill?

## 2020-08-05 NOTE — Telephone Encounter (Signed)
Please review for refill. Medication not on current med list.  Last OV: 06/14/20

## 2020-08-05 NOTE — Telephone Encounter (Signed)
Agree  we can also arrange followup withLaurie  Short

## 2020-08-05 NOTE — Patient Instructions (Signed)
-   Continue Levothyroxine 88 mcg daily
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
## 2020-08-05 NOTE — Telephone Encounter (Signed)
Medication Refill - Medication: predniSONE (DELTASONE) 10 MG tablet (Patient was advised by PCP to call when he has gout attack in knee and needs medication sent to pharmacy. Patient requesting refill for gout.)   Has the patient contacted their pharmacy?Yes (Agent: If no, request that the patient contact the pharmacy for the refill.) (Agent: If yes, when and what did the pharmacy advise?)Contact PCP  Preferred Pharmacy (with phone number or street name):  [id number] - [place], [place] Phone:  [phone]  Fax:  [phone]      Agent: Please be advised that RX refills may take up to 3 business days. We ask that you follow-up with your pharmacy.

## 2020-08-06 ENCOUNTER — Telehealth: Payer: Self-pay

## 2020-08-06 ENCOUNTER — Other Ambulatory Visit: Payer: Self-pay

## 2020-08-06 ENCOUNTER — Encounter: Payer: Self-pay | Admitting: Internal Medicine

## 2020-08-06 DIAGNOSIS — M5489 Other dorsalgia: Secondary | ICD-10-CM

## 2020-08-06 MED ORDER — PREDNISONE 10 MG PO TABS: ORAL_TABLET | ORAL | 1 refills | Status: DC

## 2020-08-06 MED ORDER — HYDROCODONE-ACETAMINOPHEN 10-325 MG PO TABS: 1.0000 | ORAL_TABLET | Freq: Three times a day (TID) | ORAL | 0 refills | Status: DC | PRN

## 2020-08-06 NOTE — Telephone Encounter (Signed)
Patient advised.

## 2020-08-06 NOTE — Telephone Encounter (Signed)
Patient called requesting refill

## 2020-08-06 NOTE — Telephone Encounter (Signed)
Copied from [place] [id number]. Topic: General - Other >> Aug 06, 2020 11:44 AM Yvette Rack wrote: Reason for CRM: Pt called for an update on the prednisone refill request. Pt requests that Dr. Caryn Section nurse return his call to explain what is going on with the prednisone refill request

## 2020-08-06 NOTE — Telephone Encounter (Signed)
Refill for prednisone was sent into the pharmacy today by Dr. Caryn Section. I tried calling patient to advise him of this. Left message to call back. OK for [facility] triage to advise patient.

## 2020-08-09 ENCOUNTER — Other Ambulatory Visit (HOSPITAL_COMMUNITY): Payer: PPO

## 2020-08-09 NOTE — Telephone Encounter (Signed)
Attempted to reach patient to follow-up on how he is feeling and provide Dr. [name] recommendation for [facility] clinic.  No answer, LVM requesting callback to device clinic.

## 2020-08-16 ENCOUNTER — Emergency Department (HOSPITAL_BASED_OUTPATIENT_CLINIC_OR_DEPARTMENT_OTHER): Payer: PPO

## 2020-08-16 ENCOUNTER — Other Ambulatory Visit: Payer: Self-pay

## 2020-08-16 ENCOUNTER — Inpatient Hospital Stay (HOSPITAL_BASED_OUTPATIENT_CLINIC_OR_DEPARTMENT_OTHER)
Admission: EM | Admit: 2020-08-16 | Discharge: 2020-08-24 | DRG: 178 | Disposition: A | Discharge status: Home / Self Care | Payer: PPO | Attending: Internal Medicine | Admitting: Internal Medicine

## 2020-08-16 ENCOUNTER — Encounter (HOSPITAL_BASED_OUTPATIENT_CLINIC_OR_DEPARTMENT_OTHER): Payer: Self-pay | Admitting: Emergency Medicine

## 2020-08-16 DIAGNOSIS — Z888 Allergy status to other drugs, medicaments and biological substances status: Secondary | ICD-10-CM

## 2020-08-16 DIAGNOSIS — I5042 Chronic combined systolic (congestive) and diastolic (congestive) heart failure: Secondary | ICD-10-CM | POA: Diagnosis not present

## 2020-08-16 DIAGNOSIS — G4730 Sleep apnea, unspecified: Secondary | ICD-10-CM | POA: Diagnosis present

## 2020-08-16 DIAGNOSIS — I951 Orthostatic hypotension: Secondary | ICD-10-CM | POA: Diagnosis not present

## 2020-08-16 DIAGNOSIS — E785 Hyperlipidemia, unspecified: Secondary | ICD-10-CM | POA: Diagnosis present

## 2020-08-16 DIAGNOSIS — E860 Dehydration: Secondary | ICD-10-CM | POA: Diagnosis present

## 2020-08-16 DIAGNOSIS — Z955 Presence of coronary angioplasty implant and graft: Secondary | ICD-10-CM

## 2020-08-16 DIAGNOSIS — Z043 Encounter for examination and observation following other accident: Secondary | ICD-10-CM | POA: Diagnosis not present

## 2020-08-16 DIAGNOSIS — Z86718 Personal history of other venous thrombosis and embolism: Secondary | ICD-10-CM

## 2020-08-16 DIAGNOSIS — I252 Old myocardial infarction: Secondary | ICD-10-CM | POA: Diagnosis not present

## 2020-08-16 DIAGNOSIS — N179 Acute kidney failure, unspecified: Secondary | ICD-10-CM

## 2020-08-16 DIAGNOSIS — Z885 Allergy status to narcotic agent status: Secondary | ICD-10-CM

## 2020-08-16 DIAGNOSIS — N184 Chronic kidney disease, stage 4 (severe): Secondary | ICD-10-CM | POA: Diagnosis present

## 2020-08-16 DIAGNOSIS — U071 COVID-19: Principal | ICD-10-CM

## 2020-08-16 DIAGNOSIS — M069 Rheumatoid arthritis, unspecified: Secondary | ICD-10-CM | POA: Diagnosis present

## 2020-08-16 DIAGNOSIS — S0990XA Unspecified injury of head, initial encounter: Secondary | ICD-10-CM | POA: Diagnosis not present

## 2020-08-16 DIAGNOSIS — W19XXXA Unspecified fall, initial encounter: Secondary | ICD-10-CM | POA: Diagnosis not present

## 2020-08-16 DIAGNOSIS — I2510 Atherosclerotic heart disease of native coronary artery without angina pectoris: Secondary | ICD-10-CM | POA: Diagnosis not present

## 2020-08-16 DIAGNOSIS — R7989 Other specified abnormal findings of blood chemistry: Secondary | ICD-10-CM

## 2020-08-16 DIAGNOSIS — I130 Hypertensive heart and chronic kidney disease with heart failure and stage 1 through stage 4 chronic kidney disease, or unspecified chronic kidney disease: Secondary | ICD-10-CM | POA: Diagnosis not present

## 2020-08-16 DIAGNOSIS — R42 Dizziness and giddiness: Secondary | ICD-10-CM

## 2020-08-16 DIAGNOSIS — R296 Repeated falls: Secondary | ICD-10-CM | POA: Diagnosis not present

## 2020-08-16 DIAGNOSIS — R531 Weakness: Secondary | ICD-10-CM

## 2020-08-16 DIAGNOSIS — M546 Pain in thoracic spine: Secondary | ICD-10-CM | POA: Diagnosis not present

## 2020-08-16 DIAGNOSIS — E038 Other specified hypothyroidism: Secondary | ICD-10-CM | POA: Diagnosis not present

## 2020-08-16 DIAGNOSIS — N200 Calculus of kidney: Secondary | ICD-10-CM | POA: Diagnosis not present

## 2020-08-16 DIAGNOSIS — T502X5A Adverse effect of carbonic-anhydrase inhibitors, benzothiadiazides and other diuretics, initial encounter: Secondary | ICD-10-CM | POA: Diagnosis present

## 2020-08-16 DIAGNOSIS — I255 Ischemic cardiomyopathy: Secondary | ICD-10-CM | POA: Diagnosis present

## 2020-08-16 DIAGNOSIS — G8929 Other chronic pain: Secondary | ICD-10-CM | POA: Diagnosis present

## 2020-08-16 DIAGNOSIS — F411 Generalized anxiety disorder: Secondary | ICD-10-CM | POA: Diagnosis present

## 2020-08-16 DIAGNOSIS — I25810 Atherosclerosis of coronary artery bypass graft(s) without angina pectoris: Secondary | ICD-10-CM | POA: Diagnosis present

## 2020-08-16 DIAGNOSIS — E876 Hypokalemia: Secondary | ICD-10-CM | POA: Diagnosis present

## 2020-08-16 DIAGNOSIS — Z79890 Hormone replacement therapy (postmenopausal): Secondary | ICD-10-CM

## 2020-08-16 DIAGNOSIS — I472 Ventricular tachycardia, unspecified: Secondary | ICD-10-CM

## 2020-08-16 DIAGNOSIS — Z79899 Other long term (current) drug therapy: Secondary | ICD-10-CM

## 2020-08-16 DIAGNOSIS — S199XXA Unspecified injury of neck, initial encounter: Secondary | ICD-10-CM | POA: Diagnosis not present

## 2020-08-16 DIAGNOSIS — K219 Gastro-esophageal reflux disease without esophagitis: Secondary | ICD-10-CM | POA: Diagnosis present

## 2020-08-16 DIAGNOSIS — S025XXA Fracture of tooth (traumatic), initial encounter for closed fracture: Secondary | ICD-10-CM | POA: Diagnosis not present

## 2020-08-16 DIAGNOSIS — Z8249 Family history of ischemic heart disease and other diseases of the circulatory system: Secondary | ICD-10-CM | POA: Diagnosis not present

## 2020-08-16 DIAGNOSIS — Z95810 Presence of automatic (implantable) cardiac defibrillator: Secondary | ICD-10-CM | POA: Diagnosis not present

## 2020-08-16 DIAGNOSIS — N281 Cyst of kidney, acquired: Secondary | ICD-10-CM | POA: Diagnosis not present

## 2020-08-16 DIAGNOSIS — E039 Hypothyroidism, unspecified: Secondary | ICD-10-CM | POA: Diagnosis not present

## 2020-08-16 DIAGNOSIS — Z8619 Personal history of other infectious and parasitic diseases: Secondary | ICD-10-CM | POA: Diagnosis not present

## 2020-08-16 LAB — CBC WITH DIFFERENTIAL/PLATELET
Abs Immature Granulocytes: 0.04 10*3/uL (ref 0.00–0.07)
Basophils Absolute: 0 10*3/uL (ref 0.0–0.1)
Basophils Relative: 0 %
Eosinophils Absolute: 0 10*3/uL (ref 0.0–0.5)
Eosinophils Relative: 0 %
HCT: 51.2 % (ref 39.0–52.0)
Hemoglobin: 16.8 g/dL (ref 13.0–17.0)
Immature Granulocytes: 1 %
Lymphocytes Relative: 3 %
Lymphs Abs: 0.3 10*3/uL — ABNORMAL LOW (ref 0.7–4.0)
MCH: 32 pg (ref 26.0–34.0)
MCHC: 32.8 g/dL (ref 30.0–36.0)
MCV: 97.5 fL (ref 80.0–100.0)
Monocytes Absolute: 0.6 10*3/uL (ref 0.1–1.0)
Monocytes Relative: 8 %
Neutro Abs: 7.1 10*3/uL (ref 1.7–7.7)
Neutrophils Relative %: 88 %
Platelets: 167 10*3/uL (ref 150–400)
RBC: 5.25 MIL/uL (ref 4.22–5.81)
RDW: 13.4 % (ref 11.5–15.5)
WBC: 8 10*3/uL (ref 4.0–10.5)
nRBC: 0 % (ref 0.0–0.2)

## 2020-08-16 LAB — COMPREHENSIVE METABOLIC PANEL
ALT: 55 U/L — ABNORMAL HIGH (ref 0–44)
AST: 72 U/L — ABNORMAL HIGH (ref 15–41)
Albumin: 4.4 g/dL (ref 3.5–5.0)
Alkaline Phosphatase: 129 U/L — ABNORMAL HIGH (ref 38–126)
Anion gap: 15 (ref 5–15)
BUN: 37 mg/dL — ABNORMAL HIGH (ref 8–23)
CO2: 25 mmol/L (ref 22–32)
Calcium: 9.5 mg/dL (ref 8.9–10.3)
Chloride: 96 mmol/L — ABNORMAL LOW (ref 98–111)
Creatinine, Ser: 4.81 mg/dL — ABNORMAL HIGH (ref 0.61–1.24)
GFR, Estimated: 13 mL/min — ABNORMAL LOW (ref >60)
Glucose, Bld: 104 mg/dL — ABNORMAL HIGH (ref 70–99)
Potassium: 3.1 mmol/L — ABNORMAL LOW (ref 3.5–5.1)
Sodium: 136 mmol/L (ref 135–145)
Total Bilirubin: 0.9 mg/dL (ref 0.3–1.2)
Total Protein: 8.7 g/dL — ABNORMAL HIGH (ref 6.5–8.1)

## 2020-08-16 NOTE — ED Triage Notes (Addendum)
Pt here with fall at home hitting head on floor. Pt has abrasion on right side of head. Pt states he has had several falls recently and was seen by his cardiologist due to the falls and taken off of his metoprolol. Pt states his "body locks up" and causes him to fall. Pt states he has not eaten or drank anything today and was going to kitchen to get something to eat.
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
## 2020-08-17 ENCOUNTER — Encounter (HOSPITAL_COMMUNITY): Payer: Self-pay | Admitting: Family Medicine

## 2020-08-17 DIAGNOSIS — M546 Pain in thoracic spine: Secondary | ICD-10-CM

## 2020-08-17 DIAGNOSIS — R296 Repeated falls: Secondary | ICD-10-CM | POA: Diagnosis not present

## 2020-08-17 DIAGNOSIS — I951 Orthostatic hypotension: Secondary | ICD-10-CM | POA: Diagnosis not present

## 2020-08-17 DIAGNOSIS — E876 Hypokalemia: Secondary | ICD-10-CM | POA: Diagnosis present

## 2020-08-17 DIAGNOSIS — M069 Rheumatoid arthritis, unspecified: Secondary | ICD-10-CM | POA: Diagnosis not present

## 2020-08-17 DIAGNOSIS — N184 Chronic kidney disease, stage 4 (severe): Secondary | ICD-10-CM | POA: Diagnosis not present

## 2020-08-17 DIAGNOSIS — E039 Hypothyroidism, unspecified: Secondary | ICD-10-CM | POA: Diagnosis not present

## 2020-08-17 DIAGNOSIS — I130 Hypertensive heart and chronic kidney disease with heart failure and stage 1 through stage 4 chronic kidney disease, or unspecified chronic kidney disease: Secondary | ICD-10-CM | POA: Diagnosis not present

## 2020-08-17 DIAGNOSIS — Z8619 Personal history of other infectious and parasitic diseases: Secondary | ICD-10-CM | POA: Diagnosis not present

## 2020-08-17 DIAGNOSIS — W19XXXA Unspecified fall, initial encounter: Secondary | ICD-10-CM | POA: Diagnosis not present

## 2020-08-17 DIAGNOSIS — U071 COVID-19: Secondary | ICD-10-CM

## 2020-08-17 DIAGNOSIS — E785 Hyperlipidemia, unspecified: Secondary | ICD-10-CM | POA: Diagnosis not present

## 2020-08-17 DIAGNOSIS — G4730 Sleep apnea, unspecified: Secondary | ICD-10-CM | POA: Diagnosis not present

## 2020-08-17 DIAGNOSIS — I5042 Chronic combined systolic (congestive) and diastolic (congestive) heart failure: Secondary | ICD-10-CM | POA: Diagnosis not present

## 2020-08-17 DIAGNOSIS — Z86718 Personal history of other venous thrombosis and embolism: Secondary | ICD-10-CM | POA: Diagnosis not present

## 2020-08-17 DIAGNOSIS — T502X5A Adverse effect of carbonic-anhydrase inhibitors, benzothiadiazides and other diuretics, initial encounter: Secondary | ICD-10-CM | POA: Diagnosis not present

## 2020-08-17 DIAGNOSIS — I25810 Atherosclerosis of coronary artery bypass graft(s) without angina pectoris: Secondary | ICD-10-CM

## 2020-08-17 DIAGNOSIS — R42 Dizziness and giddiness: Secondary | ICD-10-CM

## 2020-08-17 DIAGNOSIS — E860 Dehydration: Secondary | ICD-10-CM | POA: Diagnosis not present

## 2020-08-17 DIAGNOSIS — I472 Ventricular tachycardia: Secondary | ICD-10-CM | POA: Diagnosis not present

## 2020-08-17 DIAGNOSIS — F411 Generalized anxiety disorder: Secondary | ICD-10-CM

## 2020-08-17 DIAGNOSIS — G8929 Other chronic pain: Secondary | ICD-10-CM | POA: Diagnosis not present

## 2020-08-17 DIAGNOSIS — Z8249 Family history of ischemic heart disease and other diseases of the circulatory system: Secondary | ICD-10-CM | POA: Diagnosis not present

## 2020-08-17 DIAGNOSIS — Z95810 Presence of automatic (implantable) cardiac defibrillator: Secondary | ICD-10-CM

## 2020-08-17 DIAGNOSIS — Z955 Presence of coronary angioplasty implant and graft: Secondary | ICD-10-CM | POA: Diagnosis not present

## 2020-08-17 DIAGNOSIS — I252 Old myocardial infarction: Secondary | ICD-10-CM | POA: Diagnosis not present

## 2020-08-17 DIAGNOSIS — N179 Acute kidney failure, unspecified: Secondary | ICD-10-CM | POA: Diagnosis not present

## 2020-08-17 LAB — RESP PANEL BY RT-PCR (FLU A&B, COVID) ARPGX2
Influenza A by PCR: NEGATIVE
Influenza B by PCR: NEGATIVE
SARS Coronavirus 2 by RT PCR: POSITIVE — AB

## 2020-08-17 LAB — CBG MONITORING, ED: Glucose-Capillary: 75 mg/dL (ref 70–99)

## 2020-08-17 LAB — FIBRINOGEN: Fibrinogen: 612 mg/dL — ABNORMAL HIGH (ref 210–475)

## 2020-08-17 LAB — FERRITIN: Ferritin: 617 ng/mL — ABNORMAL HIGH (ref 24–336)

## 2020-08-17 LAB — BRAIN NATRIURETIC PEPTIDE: B Natriuretic Peptide: 71.4 pg/mL (ref 0.0–100.0)

## 2020-08-17 LAB — C-REACTIVE PROTEIN: CRP: 5.2 mg/dL — ABNORMAL HIGH (ref <1.0)

## 2020-08-17 LAB — D-DIMER, QUANTITATIVE: D-Dimer, Quant: 1.2 ug/mL-FEU — ABNORMAL HIGH (ref 0.00–0.50)

## 2020-08-17 MED ORDER — CLONAZEPAM 1 MG PO TABS
1.0000 mg | ORAL_TABLET | Freq: Every day | ORAL | Status: DC
  Administered 2020-08-18 – 2020-08-24 (×7): 1 mg via ORAL
  Filled 2020-08-17 (×8): qty 1

## 2020-08-17 MED ORDER — ASPIRIN EC 81 MG PO TBEC
81.0000 mg | DELAYED_RELEASE_TABLET | Freq: Every day | ORAL | Status: DC
  Administered 2020-08-18 – 2020-08-24 (×7): 81 mg via ORAL
  Filled 2020-08-17 (×7): qty 1

## 2020-08-17 MED ORDER — HYDROXYZINE HCL 25 MG PO TABS
25.0000 mg | ORAL_TABLET | Freq: Every evening | ORAL | Status: DC | PRN
  Administered 2020-08-23: 50 mg via ORAL
  Filled 2020-08-17: qty 2

## 2020-08-17 MED ORDER — GUAIFENESIN-DM 100-10 MG/5ML PO SYRP
10.0000 mL | ORAL_SOLUTION | ORAL | Status: DC | PRN
  Administered 2020-08-18 – 2020-08-22 (×4): 10 mL via ORAL
  Filled 2020-08-17 (×5): qty 10

## 2020-08-17 MED ORDER — ONDANSETRON HCL 4 MG/2ML IJ SOLN
4.0000 mg | Freq: Four times a day (QID) | INTRAMUSCULAR | Status: DC | PRN
  Administered 2020-08-23: 4 mg via INTRAVENOUS
  Filled 2020-08-17: qty 2

## 2020-08-17 MED ORDER — CLONAZEPAM 1 MG PO TABS
1.5000 mg | ORAL_TABLET | Freq: Every day | ORAL | Status: DC
  Administered 2020-08-18 – 2020-08-23 (×7): 1.5 mg via ORAL
  Filled 2020-08-17 (×7): qty 1

## 2020-08-17 MED ORDER — LINACLOTIDE 72 MCG PO CAPS
72.0000 ug | ORAL_CAPSULE | Freq: Every day | ORAL | Status: DC
  Filled 2020-08-17 (×3): qty 1

## 2020-08-17 MED ORDER — POTASSIUM CHLORIDE CRYS ER 20 MEQ PO TBCR
20.0000 meq | EXTENDED_RELEASE_TABLET | Freq: Once | ORAL | Status: AC
  Administered 2020-08-17: 20 meq via ORAL
  Filled 2020-08-17: qty 1

## 2020-08-17 MED ORDER — SODIUM CHLORIDE 0.9 % IV SOLN
250.0000 mL | INTRAVENOUS | Status: DC | PRN
  Administered 2020-08-17: 250 mL via INTRAVENOUS

## 2020-08-17 MED ORDER — ACETAMINOPHEN 650 MG RE SUPP: 650.0000 mg | Freq: Four times a day (QID) | RECTAL | Status: DC | PRN

## 2020-08-17 MED ORDER — LEVOTHYROXINE SODIUM 88 MCG PO TABS
88.0000 ug | ORAL_TABLET | Freq: Every day | ORAL | Status: DC
  Administered 2020-08-18 – 2020-08-24 (×7): 88 ug via ORAL
  Filled 2020-08-17 (×7): qty 1

## 2020-08-17 MED ORDER — POTASSIUM CHLORIDE CRYS ER 20 MEQ PO TBCR
40.0000 meq | EXTENDED_RELEASE_TABLET | Freq: Once | ORAL | Status: AC
  Administered 2020-08-17: 40 meq via ORAL
  Filled 2020-08-17: qty 2

## 2020-08-17 MED ORDER — ACETAMINOPHEN 500 MG PO TABS
1000.0000 mg | ORAL_TABLET | Freq: Once | ORAL | Status: DC
  Filled 2020-08-17: qty 2

## 2020-08-17 MED ORDER — FUROSEMIDE 40 MG PO TABS: 40.0000 mg | ORAL_TABLET | Freq: Every day | ORAL | Status: DC | PRN

## 2020-08-17 MED ORDER — SODIUM CHLORIDE 0.9 % IV SOLN
200.0000 mg | Freq: Once | INTRAVENOUS | Status: AC
  Administered 2020-08-18: 200 mg via INTRAVENOUS
  Filled 2020-08-17: qty 200

## 2020-08-17 MED ORDER — ONDANSETRON HCL 4 MG PO TABS: 4.0000 mg | ORAL_TABLET | Freq: Four times a day (QID) | ORAL | Status: DC | PRN

## 2020-08-17 MED ORDER — HYDROCODONE-ACETAMINOPHEN 10-325 MG PO TABS
1.0000 | ORAL_TABLET | Freq: Three times a day (TID) | ORAL | Status: DC | PRN
  Administered 2020-08-18 (×2): 1 via ORAL
  Filled 2020-08-17 (×2): qty 1

## 2020-08-17 MED ORDER — ALLOPURINOL 300 MG PO TABS: 300.0000 mg | ORAL_TABLET | Freq: Every day | ORAL | Status: DC

## 2020-08-17 MED ORDER — VENLAFAXINE HCL ER 37.5 MG PO CP24
37.5000 mg | ORAL_CAPSULE | Freq: Every day | ORAL | Status: DC
  Filled 2020-08-17 (×2): qty 1

## 2020-08-17 MED ORDER — AMIODARONE HCL 200 MG PO TABS: 200.0000 mg | ORAL_TABLET | Freq: Two times a day (BID) | ORAL | Status: DC

## 2020-08-17 MED ORDER — HYDROCODONE-ACETAMINOPHEN 5-325 MG PO TABS
2.0000 | ORAL_TABLET | Freq: Once | ORAL | Status: AC
  Administered 2020-08-17: 2 via ORAL
  Filled 2020-08-17: qty 2

## 2020-08-17 MED ORDER — SODIUM CHLORIDE 0.9 % IV SOLN: 100.0000 mg | Freq: Every day | INTRAVENOUS | Status: DC

## 2020-08-17 MED ORDER — SENNOSIDES-DOCUSATE SODIUM 8.6-50 MG PO TABS: 1.0000 | ORAL_TABLET | Freq: Every evening | ORAL | Status: DC | PRN

## 2020-08-17 MED ORDER — MEXILETINE HCL 250 MG PO CAPS
250.0000 mg | ORAL_CAPSULE | Freq: Three times a day (TID) | ORAL | Status: DC
  Administered 2020-08-17 – 2020-08-24 (×20): 250 mg via ORAL
  Filled 2020-08-17 (×21): qty 1

## 2020-08-17 MED ORDER — ACETAMINOPHEN 325 MG PO TABS: 650.0000 mg | ORAL_TABLET | Freq: Four times a day (QID) | ORAL | Status: DC | PRN

## 2020-08-17 MED ORDER — HEPARIN SODIUM (PORCINE) 5000 UNIT/ML IJ SOLN
5000.0000 [IU] | Freq: Three times a day (TID) | INTRAMUSCULAR | Status: DC
  Administered 2020-08-18 – 2020-08-24 (×20): 5000 [IU] via SUBCUTANEOUS
  Filled 2020-08-17 (×19): qty 1

## 2020-08-17 MED ORDER — ROSUVASTATIN CALCIUM 10 MG PO TABS
10.0000 mg | ORAL_TABLET | Freq: Every day | ORAL | Status: DC
Start: 2020-08-18 — End: 2020-08-18

## 2020-08-17 MED ORDER — AMIODARONE HCL 200 MG PO TABS
200.0000 mg | ORAL_TABLET | Freq: Two times a day (BID) | ORAL | Status: DC
  Filled 2020-08-17: qty 1

## 2020-08-17 NOTE — ED Notes (Signed)
Patient previously asked staff about location of wallet and keys. Staff did not receive any items from EMS, and informed patient of such.

## 2020-08-17 NOTE — ED Notes (Signed)
Patient walked to bathroom. Patient very weak and unsteady seeming. Endorsed "feeling fuzzy headed". Patient assisted back to room, provider informed. Performed CBG with result of 75. Provider informed. Patient provided dinner meal and drink.

## 2020-08-17 NOTE — ED Notes (Signed)
Unable to interrogate ICD. Provider notified. No new orders.

## 2020-08-17 NOTE — ED Notes (Signed)
Patient is resting comfortably. 

## 2020-08-17 NOTE — ED Notes (Addendum)
Pt resting comfortably, denies needs/complaints. Provided gatorade with ice per request as well as graham crackers and peanut butter. Awaiting bed placement

## 2020-08-17 NOTE — ED Notes (Signed)
Medications reviewed with patient

## 2020-08-17 NOTE — H&P (Addendum)
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 recommended   5. Hypokalemia  - Serum potassium 3.1 in ED  - Replace and repeat chem panel in am    6. Chronic pain  - No pain complaints on admission, continue home medications (Norco)   7. Anxiety  - Continue home medications (Klonopin, as-needed Vistaril)    8. Hypothyroidism  - Continue Synthroid    9. Elevated transaminases  - AST 72 and ALT 55 in ED, previously normal  - Abdominal exam benign, likely secondary to COVID infection, will trend     DVT prophylaxis: sq heparin  Code Status: Full  Family Communication: Discussed with patient  Disposition Plan:  Patient is from: Home  Anticipated d/c is to: TBD Anticipated d/c date is: 08/18/20 Patient currently: Pending orthostatic vitals, PT assessment  Consults called: None  Admission status: Observation     Vianne Bulls, MD [facility]  08/17/2020, 10:09 PM

## 2020-08-17 NOTE — ED Provider Notes (Addendum)
We were unable to interrogate the patient's ICD.  He had his ICD interrogated fashionably about a week ago without significant abnormal findings.  His symptoms are more suggestive of cataplexy than arrhythmia and we will refer to neurology.   3:45 AM The patient has been a unable to stand and ambulate on his own.  He is off balance and feels dizzy.  I do not believe it is safe to send him home at this time.  We will have him admitted for further evaluation and work-up.  Shanon Rosser, MD 08/17/20 [id number]    Shanon Rosser, MD 08/17/20 [id number]

## 2020-08-18 ENCOUNTER — Observation Stay (HOSPITAL_COMMUNITY): Payer: PPO

## 2020-08-18 DIAGNOSIS — I5042 Chronic combined systolic (congestive) and diastolic (congestive) heart failure: Secondary | ICD-10-CM | POA: Diagnosis not present

## 2020-08-18 DIAGNOSIS — R7989 Other specified abnormal findings of blood chemistry: Secondary | ICD-10-CM | POA: Diagnosis not present

## 2020-08-18 DIAGNOSIS — E039 Hypothyroidism, unspecified: Secondary | ICD-10-CM | POA: Diagnosis not present

## 2020-08-18 DIAGNOSIS — T502X5A Adverse effect of carbonic-anhydrase inhibitors, benzothiadiazides and other diuretics, initial encounter: Secondary | ICD-10-CM | POA: Diagnosis present

## 2020-08-18 DIAGNOSIS — I25810 Atherosclerosis of coronary artery bypass graft(s) without angina pectoris: Secondary | ICD-10-CM | POA: Diagnosis not present

## 2020-08-18 DIAGNOSIS — M069 Rheumatoid arthritis, unspecified: Secondary | ICD-10-CM | POA: Diagnosis present

## 2020-08-18 DIAGNOSIS — W19XXXA Unspecified fall, initial encounter: Secondary | ICD-10-CM | POA: Diagnosis not present

## 2020-08-18 DIAGNOSIS — I951 Orthostatic hypotension: Secondary | ICD-10-CM | POA: Diagnosis not present

## 2020-08-18 DIAGNOSIS — N184 Chronic kidney disease, stage 4 (severe): Secondary | ICD-10-CM | POA: Diagnosis not present

## 2020-08-18 DIAGNOSIS — Z86718 Personal history of other venous thrombosis and embolism: Secondary | ICD-10-CM | POA: Diagnosis not present

## 2020-08-18 DIAGNOSIS — I472 Ventricular tachycardia: Secondary | ICD-10-CM | POA: Diagnosis not present

## 2020-08-18 DIAGNOSIS — Z955 Presence of coronary angioplasty implant and graft: Secondary | ICD-10-CM | POA: Diagnosis not present

## 2020-08-18 DIAGNOSIS — E876 Hypokalemia: Secondary | ICD-10-CM | POA: Diagnosis present

## 2020-08-18 DIAGNOSIS — U071 COVID-19: Secondary | ICD-10-CM | POA: Diagnosis not present

## 2020-08-18 DIAGNOSIS — E860 Dehydration: Secondary | ICD-10-CM | POA: Diagnosis not present

## 2020-08-18 DIAGNOSIS — E785 Hyperlipidemia, unspecified: Secondary | ICD-10-CM | POA: Diagnosis not present

## 2020-08-18 DIAGNOSIS — R296 Repeated falls: Secondary | ICD-10-CM | POA: Diagnosis not present

## 2020-08-18 DIAGNOSIS — F411 Generalized anxiety disorder: Secondary | ICD-10-CM | POA: Diagnosis not present

## 2020-08-18 DIAGNOSIS — N179 Acute kidney failure, unspecified: Secondary | ICD-10-CM | POA: Diagnosis not present

## 2020-08-18 DIAGNOSIS — I130 Hypertensive heart and chronic kidney disease with heart failure and stage 1 through stage 4 chronic kidney disease, or unspecified chronic kidney disease: Secondary | ICD-10-CM | POA: Diagnosis not present

## 2020-08-18 DIAGNOSIS — G8929 Other chronic pain: Secondary | ICD-10-CM | POA: Diagnosis present

## 2020-08-18 DIAGNOSIS — Z8249 Family history of ischemic heart disease and other diseases of the circulatory system: Secondary | ICD-10-CM | POA: Diagnosis not present

## 2020-08-18 DIAGNOSIS — G4730 Sleep apnea, unspecified: Secondary | ICD-10-CM | POA: Diagnosis present

## 2020-08-18 DIAGNOSIS — Z8619 Personal history of other infectious and parasitic diseases: Secondary | ICD-10-CM | POA: Diagnosis not present

## 2020-08-18 DIAGNOSIS — Z95810 Presence of automatic (implantable) cardiac defibrillator: Secondary | ICD-10-CM | POA: Diagnosis not present

## 2020-08-18 DIAGNOSIS — I252 Old myocardial infarction: Secondary | ICD-10-CM | POA: Diagnosis not present

## 2020-08-18 DIAGNOSIS — R42 Dizziness and giddiness: Secondary | ICD-10-CM | POA: Diagnosis not present

## 2020-08-18 LAB — COMPREHENSIVE METABOLIC PANEL
ALT: 621 U/L — ABNORMAL HIGH (ref 0–44)
AST: 685 U/L — ABNORMAL HIGH (ref 15–41)
Albumin: 3.7 g/dL (ref 3.5–5.0)
Alkaline Phosphatase: 119 U/L (ref 38–126)
Anion gap: 13 (ref 5–15)
BUN: 39 mg/dL — ABNORMAL HIGH (ref 8–23)
CO2: 25 mmol/L (ref 22–32)
Calcium: 9.2 mg/dL (ref 8.9–10.3)
Chloride: 100 mmol/L (ref 98–111)
Creatinine, Ser: 4.89 mg/dL — ABNORMAL HIGH (ref 0.61–1.24)
GFR, Estimated: 13 mL/min — ABNORMAL LOW (ref >60)
Glucose, Bld: 87 mg/dL (ref 70–99)
Potassium: 4 mmol/L (ref 3.5–5.1)
Sodium: 138 mmol/L (ref 135–145)
Total Bilirubin: 1.1 mg/dL (ref 0.3–1.2)
Total Protein: 7.1 g/dL (ref 6.5–8.1)

## 2020-08-18 LAB — URINALYSIS, ROUTINE W REFLEX MICROSCOPIC
Bacteria, UA: NONE SEEN
Bilirubin Urine: NEGATIVE
Glucose, UA: NEGATIVE mg/dL
Ketones, ur: NEGATIVE mg/dL
Leukocytes,Ua: NEGATIVE
Nitrite: NEGATIVE
Protein, ur: 100 mg/dL — AB
Specific Gravity, Urine: 1.014 (ref 1.005–1.030)
pH: 6 (ref 5.0–8.0)

## 2020-08-18 LAB — D-DIMER, QUANTITATIVE: D-Dimer, Quant: 2.32 ug/mL-FEU — ABNORMAL HIGH (ref 0.00–0.50)

## 2020-08-18 LAB — HEPATITIS PANEL, ACUTE
HCV Ab: NONREACTIVE
Hep A IgM: NONREACTIVE
Hep B C IgM: NONREACTIVE
Hepatitis B Surface Ag: NONREACTIVE

## 2020-08-18 LAB — CBC
HCT: 49.1 % (ref 39.0–52.0)
Hemoglobin: 15.7 g/dL (ref 13.0–17.0)
MCH: 31.5 pg (ref 26.0–34.0)
MCHC: 32 g/dL (ref 30.0–36.0)
MCV: 98.4 fL (ref 80.0–100.0)
Platelets: 132 10*3/uL — ABNORMAL LOW (ref 150–400)
RBC: 4.99 MIL/uL (ref 4.22–5.81)
RDW: 13.7 % (ref 11.5–15.5)
WBC: 6.1 10*3/uL (ref 4.0–10.5)
nRBC: 0 % (ref 0.0–0.2)

## 2020-08-18 LAB — C-REACTIVE PROTEIN: CRP: 9.2 mg/dL — ABNORMAL HIGH (ref <1.0)

## 2020-08-18 LAB — MAGNESIUM: Magnesium: 2.1 mg/dL (ref 1.7–2.4)

## 2020-08-18 LAB — PHOSPHORUS: Phosphorus: 3.4 mg/dL (ref 2.5–4.6)

## 2020-08-18 LAB — HIV ANTIBODY (ROUTINE TESTING W REFLEX): HIV Screen 4th Generation wRfx: NONREACTIVE

## 2020-08-18 LAB — FERRITIN: Ferritin: 2273 ng/mL — ABNORMAL HIGH (ref 24–336)

## 2020-08-18 MED ORDER — OXYCODONE HCL 5 MG PO TABS
5.0000 mg | ORAL_TABLET | Freq: Three times a day (TID) | ORAL | Status: DC | PRN
  Administered 2020-08-18 – 2020-08-19 (×2): 5 mg via ORAL
  Filled 2020-08-18 (×2): qty 1

## 2020-08-18 MED ORDER — AMIODARONE HCL 200 MG PO TABS: 200.0000 mg | ORAL_TABLET | ORAL | Status: DC

## 2020-08-18 MED ORDER — MENTHOL 3 MG MT LOZG
1.0000 | LOZENGE | OROMUCOSAL | Status: DC | PRN
  Filled 2020-08-18 (×2): qty 9

## 2020-08-18 MED ORDER — SODIUM CHLORIDE 0.9 % IV SOLN: 100.0000 mg | Freq: Every day | INTRAVENOUS | Status: DC

## 2020-08-18 MED ORDER — ALLOPURINOL 300 MG PO TABS
150.0000 mg | ORAL_TABLET | Freq: Every day | ORAL | Status: DC
  Administered 2020-08-18: 150 mg via ORAL
  Filled 2020-08-18: qty 1

## 2020-08-18 MED ORDER — LACTATED RINGERS IV SOLN: INTRAVENOUS | Status: AC
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
## 2020-08-18 NOTE — Progress Notes (Addendum)
PROGRESS NOTE    Richard Cook  FBP:[id number] DOB: 1955-10-10 DOA: 08/16/2020 PCP: Birdie Sons, MD  Chief Complaint  Patient presents with  . Fall    Brief Narrative:  Richard Cook is Richard Cook 65 y.o. male with medical history significant for coronary artery disease, chronic systolic and diastolic CHF, ventricular tachycardia with ICD, chronic kidney disease stage IV, hypothyroidism, anxiety, and chronic pain, now presenting to the emergency department after Richard Cook fall at home.  Patient reports frequent falls over the past 1.5 years, usually while walking and feels that his legs give out, but yesterday's fall was different and that it was preceded by nausea and lightheadedness and he has also been feeling generally weak.  General weakness, malaise, and nausea began yesterday, he thought he would feel better if he ate something, but when he began to walk to the kitchen, developed acute lightheadedness and fell, striking his right face and head.  He denies any loss of consciousness.  Denies any focal numbness or weakness.  Had not been coughing until he presented to the hospital and denies any shortness of breath.  Reports frequent left ankle swelling but not currently.  Denies chest pain or palpitations.  [facility] ED Course: Upon arrival to the ED, patient is found to be afebrile, saturating mid 90s on room air, and with stable blood pressure.  EKG features sinus rhythm with nonspecific IVCD.  Chest x-ray is negative for acute cardiopulmonary disease.  Head CT negative for acute intracranial abnormality.  No acute displaced fracture, maxillofacial CT and no acute findings on CT cervical spine.  Chemistry panel notable for potassium 3.1, AST 72, ALT 55, and creatinine 4.81, up from 2.76 in December.  CBC is unremarkable.  COVID-19 PCR is positive.  CRP elevated to 5.2.  Plan was to discharge the patient home from the emergency department but he felt dizzy and off balance while  ambulating and was transferred to [facility] for ongoing evaluation and management.  Assessment & Plan:   Principal Problem:   Dizziness Active Problems:   Chronic combined systolic and diastolic CHF (congestive heart failure) (HCC)   VT (ventricular tachycardia) (HCC)   Anxiety state   Coronary artery disease involving coronary bypass graft without angina pectoris   CKD (chronic kidney disease), stage IV (HCC)   Chronic thoracic back pain (1ry area of Pain) (Right)   Hypothyroid   ICD (implantable cardioverter-defibrillator) in place   Hypokalemia   COVID-19 virus infection   Fall  1. Frequent falls  - Presents after Richard Cook fall at home, reports frequent falls for the past 1.5 years (after extended exertion on trails), but yesterday's event was different in that he had preceding nausea and lightheadedness and has been feeling generally weak (and occurred at home) - No acute findings on CT head, no acute neurologic deficits noted   - Normal device function and no arrhythmias on device interrogation per most recent cardiology note  - He is scheduled for TTE and stress-test on 2/1 and will see neurology in March  - will obtain echo in house - Recent worsening likely related to COVID infection and dehydration   - Check orthostatics (unable to stand for orthostatics yesterday), hold diuretics -> will give 500 cc over 5 hrs (caution given hx HF), continue cardiac monitoring, fall precautions, consult PT for eval and tx    # Fever Likely 2/2 covid infection, continue to monitor  # Elevated LFT's Likely 2/2 covid vs remdesivir given  new onset with hospitalization in setting of covid (with concern for dehydration/orthostasis ?shock liver - vitals don't necessarily support this).  Stop tylenol, hold amiodarone and crestor and allopurinol for now (discussed with pharmacy).   RUQ [place] without abnormality seen in RUQ Follow acute hepatitis panel Trend  2. CKD IV  - SCr is 4.81 in ED, up  from 2.76 in December 2021  - 4.89 today, follow with IVF, holding diuretics - Renally-dose medications, hold diuretics initially, monitor    3. Ischemic cardiomyopathy; VT  - EF 25-30% in June 2019, has ICD in place and managed with amiodarone and mexilitine  - Normal device function and no arrhythmias on recent device interrogation per cardiology note last month  - Appears compensated, hold diuretics initially given increased SCr, monitor weight    4. COVID-19  - Patient received 3rd vaccination 05/29/20  - No significant respiratory symptoms, CXR clear  - Due to age, heart disease, and kidney disease, he is high-risk for severe disease and 3-day course of remdesivir was recommended - stopped now due to LFT bump Elevated dimer, follow LE [place]  COVID-19 Labs  Recent Labs    08/17/20 0638 08/18/20 0334  DDIMER 1.20* 2.32*  FERRITIN 617* 2,273*  CRP 5.2* 9.2*    Lab Results  Component Value Date   SARSCOV2NAA POSITIVE (Richard Cook) 08/17/2020   5. Hypokalemia  - Serum potassium 3.1 in ED  - Replace and repeat chem panel in am    6. Chronic pain  - No pain complaints on admission, continue home medications (Norco)   7. Anxiety  - Continue home medications (Klonopin, as-needed Vistaril)    8. Hypothyroidism  - Continue Synthroid    Age indeterminate fx maxillary teeth Follow outpatient  DVT prophylaxis: heparin Code Status: full  Family Communication: none at bedsdie Disposition:   Status is: Observation  The patient will require care spanning > 2 midnights and should be moved to inpatient because: Inpatient level of care appropriate due to severity of illness  Dispo: The patient is from: Home              Anticipated d/c is to: Home              Anticipated d/c date is: > 3 days              Patient currently is not medically stable to d/c.       Consultants:   none  Procedures:   none  Antimicrobials:  Anti-infectives (From admission, onward)   Start      Dose/Rate Route Frequency Ordered Stop   08/19/20 1000  remdesivir 100 mg in sodium chloride 0.9 % 100 mL IVPB  Status:  Discontinued       "Followed by" Linked Group Details   100 mg 200 mL/hr over 30 Minutes Intravenous Daily 08/18/20 0039 08/18/20 0734   08/18/20 1000  remdesivir 100 mg in sodium chloride 0.9 % 100 mL IVPB  Status:  Discontinued       "Followed by" Linked Group Details   100 mg 200 mL/hr over 30 Minutes Intravenous Daily 08/17/20 2207 08/18/20 0039   08/17/20 2300  remdesivir 200 mg in sodium chloride 0.9% 250 mL IVPB       "Followed by" Linked Group Details   200 mg 580 mL/hr over 30 Minutes Intravenous Once 08/17/20 2207 08/18/20 0037         Subjective: Feels ok, notes cough and sore throat  Objective: Vitals:   08/18/20  0150 08/18/20 0313 08/18/20 0553 08/18/20 1038  BP: 127/83  121/84 (!) 132/91  Pulse: 77  66 82  Resp: 18  (!) 21 14  Temp: 98.9 F (37.2 C)  98 F (36.7 C) 100.2 F (37.9 C)  TempSrc: Oral  Oral Oral  SpO2: 96%  96% 94%  Weight:  80 kg 80 kg   Height:  5\' 10"  (1.778 m)      Intake/Output Summary (Last 24 hours) at 08/18/2020 1349 Last data filed at 08/18/2020 1030 Gross per 24 hour  Intake 412.27 ml  Output 625 ml  Net -212.73 ml   Filed Weights   08/16/20 2033 08/18/20 0313 08/18/20 0553  Weight: 77.6 kg 80 kg 80 kg    Examination:  General exam: Appears calm and comfortable  Respiratory system: Clear to auscultation. Respiratory effort normal. Cardiovascular system: S1 & S2 heard, RRR. Gastrointestinal system: Abdomen is nondistended, soft and nontender.  Central nervous system: Alert and oriented. No focal neurological deficits. Extremities: no LEE Skin: No rashes, lesions or ulcers Psychiatry: Judgement and insight appear normal. Mood & affect appropriate.     Data Reviewed: I have personally reviewed following labs and imaging studies  CBC: Recent Labs  Lab 08/16/20 2323 08/18/20 0334  WBC 8.0 6.1   NEUTROABS 7.1  --   HGB 16.8 15.7  HCT 51.2 49.1  MCV 97.5 98.4  PLT 167 132*    Basic Metabolic Panel: Recent Labs  Lab 08/16/20 2323 08/18/20 0334  NA 136 138  K 3.1* 4.0  CL 96* 100  CO2 25 25  GLUCOSE 104* 87  BUN 37* 39*  CREATININE 4.81* 4.89*  CALCIUM 9.5 9.2  MG  --  2.1  PHOS  --  3.4    GFR: Estimated Creatinine Clearance: 16 mL/min (Iyahna Obriant) (by C-G formula based on SCr of 4.89 mg/dL (H)).  Liver Function Tests: Recent Labs  Lab 08/16/20 2323 08/18/20 0334  AST 72* 685*  ALT 55* 621*  ALKPHOS 129* 119  BILITOT 0.9 1.1  PROT 8.7* 7.1  ALBUMIN 4.4 3.7    CBG: Recent Labs  Lab 08/17/20 0123  GLUCAP 75     Recent Results (from the past 240 hour(s))  Resp Panel by RT-PCR (Flu Myrta Mercer&B, Covid) Nasopharyngeal Swab     Status: Abnormal   Collection Time: 08/17/20  4:26 AM   Specimen: Nasopharyngeal Swab; Nasopharyngeal(NP) swabs in vial transport medium  Result Value Ref Range Status   SARS Coronavirus 2 by RT PCR POSITIVE (Saraphina Lauderbaugh) NEGATIVE Final    Comment: RESULT CALLED TO, READ BACK BY AND VERIFIED WITH: NEAL,K AT 0516 ON [id number] BY CHERESNOWSKY,T (NOTE) SARS-CoV-2 target nucleic acids are DETECTED.  The SARS-CoV-2 RNA is generally detectable in upper respiratory specimens during the acute phase of infection. Positive results are indicative of the presence of the identified virus, but do not rule out bacterial infection or co-infection with other pathogens not detected by the test. Clinical correlation with patient history and other diagnostic information is necessary to determine patient infection status. The expected result is Negative.  Fact Sheet for Patients: EntrepreneurPulse.com.au  Fact Sheet for Healthcare Providers: IncredibleEmployment.be  This test is not yet approved or cleared by the [place] FDA and  has been authorized for detection and/or diagnosis of SARS-CoV-2 by FDA under an Emergency Use  Authorization (EUA).  This EUA will remain in effect (meaning this te st can be used) for the duration of  the COVID-19 declaration under Section 564(b)(1) of the Act,  21 U.S.C. section 360bbb-3(b)(1), unless the authorization is terminated or revoked sooner.     Influenza Kyreese Chio by PCR NEGATIVE NEGATIVE Final   Influenza B by PCR NEGATIVE NEGATIVE Final    Comment: (NOTE) The Xpert Xpress SARS-CoV-2/FLU/RSV plus assay is intended as an aid in the diagnosis of influenza from Nasopharyngeal swab specimens and should not be used as Deric Bocock sole basis for treatment. Nasal washings and aspirates are unacceptable for Xpert Xpress SARS-CoV-2/FLU/RSV testing.  Fact Sheet for Patients: EntrepreneurPulse.com.au  Fact Sheet for Healthcare Providers: IncredibleEmployment.be  This test is not yet approved or cleared by the [place] FDA and has been authorized for detection and/or diagnosis of SARS-CoV-2 by FDA under an Emergency Use Authorization (EUA). This EUA will remain in effect (meaning this test can be used) for the duration of the COVID-19 declaration under Section 564(b)(1) of the Act, 21 U.S.C. section 360bbb-3(b)(1), unless the authorization is terminated or revoked.  Performed at [facility], [address], [place], [place] [address]          Radiology Studies: CT Head Wo Contrast  Result Date: 08/16/2020 CLINICAL DATA:  Status post fall, hitting head on floor. Has had several recent falls . EXAM: CT HEAD WITHOUT CONTRAST CT MAXILLOFACIAL WITHOUT CONTRAST CT CERVICAL SPINE WITHOUT CONTRAST TECHNIQUE: Multidetector CT imaging of the head, cervical spine, and maxillofacial structures were performed using the standard protocol without intravenous contrast. Multiplanar CT image reconstructions of the cervical spine and maxillofacial structures were also generated. COMPARISON:  CT head and cervical spine 04/30/2017 FINDINGS: CT HEAD  FINDINGS Brain: Patchy and confluent areas of decreased attenuation are noted throughout the deep and periventricular white matter of the cerebral hemispheres bilaterally, compatible with chronic microvascular ischemic disease. No evidence of large-territorial acute infarction. No parenchymal hemorrhage. No mass lesion. No extra-axial collection. Similar-appearing retro cerebellar left cerebrospinal fluid density unchanged. No mass effect or midline shift. No hydrocephalus. Basilar cisterns are patent. Vascular: No hyperdense vessel. Skull: No acute fracture or focal lesion. Other: None. CT MAXILLOFACIAL FINDINGS Osseous: Age-indeterminate fracture of several maxillary teeth. No definite periapical lucency to suggest loosening. No fracture or mandibular dislocation. No destructive process. Sinuses/Orbits: Paranasal sinuses and mastoid air cells are clear. The orbits are unremarkable. Soft tissues: Negative. CT CERVICAL SPINE FINDINGS Alignment: Slight straightening of the normal cervical lordosis likely due to positioning. Skull base and vertebrae: Multilevel mild degenerative changes of the spine worse at the C5-C6 and C6-C7 levels. No acute fracture. No aggressive appearing focal osseous lesion or focal pathologic process. Soft tissues and spinal canal: No prevertebral fluid or swelling. No visible canal hematoma. Disc levels:  Maintained. Upper chest: Unremarkable. Other: None. IMPRESSION: 1. No acute intracranial abnormality. 2. No acute displaced facial fracture. 3. No acute displaced fracture or traumatic listhesis of the cervical spine. 4. Age-indeterminate fracture of several maxillary teeth. Recommend clinical correlation with physical exam and history. Electronically Signed   By: Iven Finn M.D.   On: 08/16/2020 23:14   CT Cervical Spine Wo Contrast  Result Date: 08/16/2020 CLINICAL DATA:  Status post fall, hitting head on floor. Has had several recent falls . EXAM: CT HEAD WITHOUT CONTRAST CT  MAXILLOFACIAL WITHOUT CONTRAST CT CERVICAL SPINE WITHOUT CONTRAST TECHNIQUE: Multidetector CT imaging of the head, cervical spine, and maxillofacial structures were performed using the standard protocol without intravenous contrast. Multiplanar CT image reconstructions of the cervical spine and maxillofacial structures were also generated. COMPARISON:  CT head and cervical spine 04/30/2017 FINDINGS: CT HEAD  FINDINGS Brain: Patchy and confluent areas of decreased attenuation are noted throughout the deep and periventricular white matter of the cerebral hemispheres bilaterally, compatible with chronic microvascular ischemic disease. No evidence of large-territorial acute infarction. No parenchymal hemorrhage. No mass lesion. No extra-axial collection. Similar-appearing retro cerebellar left cerebrospinal fluid density unchanged. No mass effect or midline shift. No hydrocephalus. Basilar cisterns are patent. Vascular: No hyperdense vessel. Skull: No acute fracture or focal lesion. Other: None. CT MAXILLOFACIAL FINDINGS Osseous: Age-indeterminate fracture of several maxillary teeth. No definite periapical lucency to suggest loosening. No fracture or mandibular dislocation. No destructive process. Sinuses/Orbits: Paranasal sinuses and mastoid air cells are clear. The orbits are unremarkable. Soft tissues: Negative. CT CERVICAL SPINE FINDINGS Alignment: Slight straightening of the normal cervical lordosis likely due to positioning. Skull base and vertebrae: Multilevel mild degenerative changes of the spine worse at the C5-C6 and C6-C7 levels. No acute fracture. No aggressive appearing focal osseous lesion or focal pathologic process. Soft tissues and spinal canal: No prevertebral fluid or swelling. No visible canal hematoma. Disc levels:  Maintained. Upper chest: Unremarkable. Other: None. IMPRESSION: 1. No acute intracranial abnormality. 2. No acute displaced facial fracture. 3. No acute displaced fracture or traumatic  listhesis of the cervical spine. 4. Age-indeterminate fracture of several maxillary teeth. Recommend clinical correlation with physical exam and history. Electronically Signed   By: Iven Finn M.D.   On: 08/16/2020 23:14   DG Chest Port 1 View  Result Date: 08/16/2020 CLINICAL DATA:  Fall EXAM: PORTABLE CHEST 1 VIEW COMPARISON:  None. FINDINGS: The heart size and mediastinal contours are within normal limits. Isaack Preble left-sided pacemaker seen with the lead tips at the right ventricle. Overlying median sternotomy wires are present. Both lungs are clear. The visualized skeletal structures are unremarkable. IMPRESSION: No active disease. Electronically Signed   By: Prudencio Pair M.D.   On: 08/16/2020 23:12   CT Maxillofacial WO CM  Result Date: 08/16/2020 CLINICAL DATA:  Status post fall, hitting head on floor. Has had several recent falls . EXAM: CT HEAD WITHOUT CONTRAST CT MAXILLOFACIAL WITHOUT CONTRAST CT CERVICAL SPINE WITHOUT CONTRAST TECHNIQUE: Multidetector CT imaging of the head, cervical spine, and maxillofacial structures were performed using the standard protocol without intravenous contrast. Multiplanar CT image reconstructions of the cervical spine and maxillofacial structures were also generated. COMPARISON:  CT head and cervical spine 04/30/2017 FINDINGS: CT HEAD FINDINGS Brain: Patchy and confluent areas of decreased attenuation are noted throughout the deep and periventricular white matter of the cerebral hemispheres bilaterally, compatible with chronic microvascular ischemic disease. No evidence of large-territorial acute infarction. No parenchymal hemorrhage. No mass lesion. No extra-axial collection. Similar-appearing retro cerebellar left cerebrospinal fluid density unchanged. No mass effect or midline shift. No hydrocephalus. Basilar cisterns are patent. Vascular: No hyperdense vessel. Skull: No acute fracture or focal lesion. Other: None. CT MAXILLOFACIAL FINDINGS Osseous: Age-indeterminate  fracture of several maxillary teeth. No definite periapical lucency to suggest loosening. No fracture or mandibular dislocation. No destructive process. Sinuses/Orbits: Paranasal sinuses and mastoid air cells are clear. The orbits are unremarkable. Soft tissues: Negative. CT CERVICAL SPINE FINDINGS Alignment: Slight straightening of the normal cervical lordosis likely due to positioning. Skull base and vertebrae: Multilevel mild degenerative changes of the spine worse at the C5-C6 and C6-C7 levels. No acute fracture. No aggressive appearing focal osseous lesion or focal pathologic process. Soft tissues and spinal canal: No prevertebral fluid or swelling. No visible canal hematoma. Disc levels:  Maintained. Upper chest: Unremarkable. Other: None. IMPRESSION: 1. No  acute intracranial abnormality. 2. No acute displaced facial fracture. 3. No acute displaced fracture or traumatic listhesis of the cervical spine. 4. Age-indeterminate fracture of several maxillary teeth. Recommend clinical correlation with physical exam and history. Electronically Signed   By: Iven Finn M.D.   On: 08/16/2020 23:14   US Abdomen Limited RUQ (LIVER/GB)  Result Date: 08/18/2020 CLINICAL DATA:  Elevated liver function test. EXAM: ULTRASOUND ABDOMEN LIMITED RIGHT UPPER QUADRANT COMPARISON:  March 24, 2019. FINDINGS: Gallbladder: Status post cholecystectomy. Common bile duct: Diameter: 2 mm which is within normal limits. Liver: No focal lesion identified. Within normal limits in parenchymal echogenicity. Portal vein is patent on color Doppler imaging with normal direction of blood flow towards the liver. Other: None. IMPRESSION: Status post cholecystectomy. No other abnormality seen in the right upper quadrant of the abdomen. Electronically Signed   By: Marijo Conception M.D.   On: 08/18/2020 10:26        Scheduled Meds: . allopurinol  150 mg Oral Daily  . aspirin EC  81 mg Oral Daily  . clonazePAM  1 mg Oral Q1500  .  clonazePAM  1.5 mg Oral QHS  . heparin  5,000 Units Subcutaneous Q8H  . levothyroxine  88 mcg Oral Daily  . mexiletine  250 mg Oral TID   Continuous Infusions: . sodium chloride Stopped (08/18/20 0043)  . lactated ringers 100 mL/hr at 08/18/20 1305     LOS: 0 days    Time spent: over 30 min    Fayrene Helper, MD [facility]   To contact the attending provider between 7A-7P or the covering provider during after hours 7P-7A, please log into the web site [email or web address] and access using universal [place] password for that web site. If you do not have the password, please call the hospital operator.  08/18/2020, 1:49 PM

## 2020-08-19 ENCOUNTER — Inpatient Hospital Stay (HOSPITAL_COMMUNITY): Payer: PPO

## 2020-08-19 ENCOUNTER — Telehealth: Payer: Self-pay | Admitting: Internal Medicine

## 2020-08-19 DIAGNOSIS — R7989 Other specified abnormal findings of blood chemistry: Secondary | ICD-10-CM | POA: Diagnosis not present

## 2020-08-19 DIAGNOSIS — U071 COVID-19: Secondary | ICD-10-CM

## 2020-08-19 DIAGNOSIS — I5042 Chronic combined systolic (congestive) and diastolic (congestive) heart failure: Secondary | ICD-10-CM | POA: Diagnosis not present

## 2020-08-19 LAB — COMPREHENSIVE METABOLIC PANEL
ALT: 726 U/L — ABNORMAL HIGH (ref 0–44)
AST: 757 U/L — ABNORMAL HIGH (ref 15–41)
Albumin: 3.5 g/dL (ref 3.5–5.0)
Alkaline Phosphatase: 115 U/L (ref 38–126)
Anion gap: 16 — ABNORMAL HIGH (ref 5–15)
BUN: 43 mg/dL — ABNORMAL HIGH (ref 8–23)
CO2: 22 mmol/L (ref 22–32)
Calcium: 8.8 mg/dL — ABNORMAL LOW (ref 8.9–10.3)
Chloride: 102 mmol/L (ref 98–111)
Creatinine, Ser: 5.09 mg/dL — ABNORMAL HIGH (ref 0.61–1.24)
GFR, Estimated: 12 mL/min — ABNORMAL LOW (ref >60)
Glucose, Bld: 83 mg/dL (ref 70–99)
Potassium: 3.9 mmol/L (ref 3.5–5.1)
Sodium: 140 mmol/L (ref 135–145)
Total Bilirubin: 1 mg/dL (ref 0.3–1.2)
Total Protein: 6.8 g/dL (ref 6.5–8.1)

## 2020-08-19 LAB — ECHOCARDIOGRAM COMPLETE
Area-P 1/2: 3.27 cm2
Calc EF: 40.4 %
Height: 70 in
S' Lateral: 4.1 cm
Single Plane A2C EF: 36.9 %
Single Plane A4C EF: 42.4 %
Weight: 2814.83 oz

## 2020-08-19 LAB — URINALYSIS, ROUTINE W REFLEX MICROSCOPIC
Bilirubin Urine: NEGATIVE
Glucose, UA: NEGATIVE mg/dL
Ketones, ur: NEGATIVE mg/dL
Leukocytes,Ua: NEGATIVE
Nitrite: NEGATIVE
Protein, ur: 100 mg/dL — AB
Specific Gravity, Urine: 1.013 (ref 1.005–1.030)
pH: 6 (ref 5.0–8.0)

## 2020-08-19 LAB — CBC
HCT: 48.2 % (ref 39.0–52.0)
Hemoglobin: 15.4 g/dL (ref 13.0–17.0)
MCH: 32.2 pg (ref 26.0–34.0)
MCHC: 32 g/dL (ref 30.0–36.0)
MCV: 100.6 fL — ABNORMAL HIGH (ref 80.0–100.0)
Platelets: 137 10*3/uL — ABNORMAL LOW (ref 150–400)
RBC: 4.79 MIL/uL (ref 4.22–5.81)
RDW: 13.9 % (ref 11.5–15.5)
WBC: 6.8 10*3/uL (ref 4.0–10.5)
nRBC: 0 % (ref 0.0–0.2)

## 2020-08-19 LAB — C-REACTIVE PROTEIN: CRP: 12.1 mg/dL — ABNORMAL HIGH (ref <1.0)

## 2020-08-19 LAB — FERRITIN: Ferritin: 2036 ng/mL — ABNORMAL HIGH (ref 24–336)

## 2020-08-19 MED ORDER — PERFLUTREN LIPID MICROSPHERE
1.0000 mL | INTRAVENOUS | Status: AC | PRN
Start: 2020-08-19 — End: 2020-08-19
  Administered 2020-08-19: 2 mL via INTRAVENOUS

## 2020-08-19 MED ORDER — LACTATED RINGERS IV BOLUS: 1000.0000 mL | Freq: Once | INTRAVENOUS | Status: DC

## 2020-08-19 MED ORDER — LACTATED RINGERS IV BOLUS
1000.0000 mL | Freq: Once | INTRAVENOUS | Status: AC
  Administered 2020-08-19: 1000 mL via INTRAVENOUS

## 2020-08-19 MED ORDER — OXYCODONE HCL 5 MG PO TABS
5.0000 mg | ORAL_TABLET | Freq: Three times a day (TID) | ORAL | Status: DC
  Administered 2020-08-19 – 2020-08-24 (×14): 5 mg via ORAL
  Filled 2020-08-19 (×14): qty 1

## 2020-08-19 NOTE — Progress Notes (Signed)
Lower extremity venous bilateral study completed.   Please see CV Proc for preliminary results.   Valinda Fedie, RDMS  

## 2020-08-19 NOTE — TOC Initial Note (Signed)
Transition of Care [facility]) - Initial/Assessment Note    Patient Details  Name: Richard Cook MRN: [id number] Date of Birth: July 03, 1956  Transition of Care [facility]) CM/SW Contact:    Trish Mage, LCSW Phone Number: 08/19/2020, 2:11 PM  Clinical Narrative:   Patient seen in follow up to PT recommendation of SNF. 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  Furthermore, although he lives alone, he is not concerned about his safety when he returns home and does not plan to stay with anyone or have anyone come stay with him.  I let him know to ask for me if he changes his mind about any of this. TOC will continue to follow during the course of hospitalization.                 Expected Discharge Plan: Home/Self Care Barriers to Discharge: No Barriers Identified   Patient Goals and CMS Choice Patient states their goals for this hospitalization and ongoing recovery are:: "I will be going home."      Expected Discharge Plan and Services Expected Discharge Plan: Home/Self Care   Discharge Planning Services: CM Consult   Living arrangements for the past 2 months: Hotel/Motel                                      Prior Living Arrangements/Services Living arrangements for the past 2 months: Hotel/Motel Lives with:: Self Patient language and need for interpreter reviewed:: Yes Do you feel safe going back to the place where you live?: Yes      Need for Family Participation in Patient Care: No (Comment) Care giver support system in place?: No (comment)   Criminal Activity/Legal Involvement Pertinent to Current Situation/Hospitalization: No - Comment as needed  Activities of Daily Living Home Assistive Devices/Equipment: None ADL Screening (condition at time of admission) Patient's  cognitive ability adequate to safely complete daily activities?: Yes Is the patient deaf or have difficulty hearing?: No Does the patient have difficulty seeing, even when wearing glasses/contacts?: No Does the patient have difficulty concentrating, remembering, or making decisions?: No Patient able to express need for assistance with ADLs?: Yes Does the patient have difficulty dressing or bathing?: No Independently performs ADLs?: Yes (appropriate for developmental age) Does the patient have difficulty walking or climbing stairs?: No Weakness of Legs: Left Weakness of Arms/Hands: None  Permission Sought/Granted                  Emotional Assessment Appearance:: Appears stated age Attitude/Demeanor/Rapport: Engaged Affect (typically observed): Appropriate Orientation: : Oriented to Self,Oriented to Place,Oriented to  Time,Oriented to Situation Alcohol / Substance Use: Not Applicable Psych Involvement: No (comment)  Admission diagnosis:  Hypokalemia [E87.6] Dizziness [R42] Generalized weakness [R53.1] Fall, initial encounter [W19.XXXA] COVID-19 virus infection [U07.1] Fall [W19.XXXA] Patient Active Problem List   Diagnosis Date Noted  . Dizziness 08/17/2020  . Hypokalemia 08/17/2020  . COVID-19 virus infection   . Fall   . Chronic systolic CHF (congestive heart failure) ([place]) 05/02/2020  . Syncope and collapse 05/02/2020  . Spondylosis without myelopathy or radiculopathy, lumbosacral region 02/06/2020  . Chronic hip pain (Bilateral) (L>R) 01/30/2020  .  ICD (implantable cardioverter-defibrillator) in place 10/24/2019  . Hypothyroid 05/28/2019  . DDD (degenerative disc disease), lumbosacral 05/24/2019  . DDD (degenerative disc disease), thoracic 05/24/2019  . Thoracic facet syndrome 03/29/2018  . Spondylosis without myelopathy or radiculopathy, thoracic region 03/29/2018  . Thoracic radiculitis (Right) 03/29/2018  . Chronic thoracic back pain (1ry area of Pain) (Right)  03/28/2018  . Myofascial pain 03/28/2018  . Depression, major, single episode, moderate ([place]) 10/19/2016  . Chronic pain syndrome   . Acute on chronic systolic CHF (congestive heart failure) ([place])   . CKD (chronic kidney disease), stage IV ([place]) 01/20/2016  . Hypomagnesemia 01/20/2016  . Chronic low back pain (Bilateral) (R>L) w/o sciatica 01/13/2016  . Lumbar spondylosis (L4-5 and L5-S1 bulging disks) 01/13/2016  . Lumbar facet syndrome (Bilateral) (L>R) 01/13/2016  . Chronic lower extremity pain (referred) (2ry area of Pain) (Left) 01/13/2016  . Back spasm 01/13/2016  . Nocturnal hypoxemia 12/30/2015  . Insomnia 03/29/2015  . Edema 03/29/2015  . Chronic combined systolic and diastolic CHF (congestive heart failure) ([place])   . VT (ventricular tachycardia) ([place])   . CAD in native artery   . Chronic upper back pain 02/28/2014  . Cardiomyopathy, ischemic 02/28/2014  . GERD (gastroesophageal reflux disease) 08/14/2011  . Coronary artery disease involving coronary bypass graft without angina pectoris 08/10/2011  . CAD- PCI to RCA 08/10/11, CABG in [place] 5/14   . Anxiety state 06/03/2009  . Essential (primary) hypertension 01/09/2004  . Allergic rhinitis 11/09/2003   PCP:  Birdie Sons, MD Pharmacy:   [id number] [id number]: [phone]: [phone]    Social Determinants of Health (SDOH) Interventions    Readmission Risk Interventions No flowsheet data found.

## 2020-08-19 NOTE — Telephone Encounter (Signed)
    Pt c/o medication issue:  1. Name of Medication: amiodarone, Mexiletine  2. How are you currently taking this medication (dosage and times per day)?   3. Are you having a reaction (difficulty breathing--STAT)? *  4. What is your medication issue? Anh pharmacist with [facility] hospital would like to verify if pt needs to be on these medications.

## 2020-08-19 NOTE — CV Procedure (Signed)
2D echo attempted. Patient in chair eating. Will try later

## 2020-08-19 NOTE — CV Procedure (Signed)
2D echo attempted, but patient in chair, will try later

## 2020-08-19 NOTE — Progress Notes (Signed)
PROGRESS NOTE    Richard Cook  [id number] DOB: [birth year] DOA: 08/16/2020 PCP: Birdie Sons, MD  Chief Complaint  Patient presents with  . Fall    Brief Narrative:  Richard Cook is [name] 65 y.o. male with medical history significant for coronary artery disease, chronic systolic and diastolic CHF, ventricular tachycardia with ICD, chronic kidney disease stage IV, hypothyroidism, anxiety, and chronic pain, now presenting to the emergency department after Richard Cook fall at home.  Patient reports frequent falls over the past 1.5 years, usually while walking and feels that his legs give out, but yesterday's fall was different and that it was preceded by nausea and lightheadedness and he has also been feeling generally weak.  General weakness, malaise, and nausea began yesterday, he thought he would feel better if he ate something, but when he began to walk to the kitchen, developed acute lightheadedness and fell, striking his right face and head.  He denies any loss of consciousness.  Denies any focal numbness or weakness.  Had not been coughing until he presented to the hospital and denies any shortness of breath.  Reports frequent left ankle swelling but not currently.  Denies chest pain or palpitations.  [facility] ED Course: Upon arrival to the ED, patient is found to be afebrile, saturating mid 90s on room air, and with stable blood pressure.  EKG features sinus rhythm with nonspecific IVCD.  Chest x-ray is negative for acute cardiopulmonary disease.  Head CT negative for acute intracranial abnormality.  No acute displaced fracture, maxillofacial CT and no acute findings on CT cervical spine.  Chemistry panel notable for potassium 3.1, AST 72, ALT 55, and creatinine 4.81, up from 2.76 in December.  CBC is unremarkable.  COVID-19 PCR is positive.  CRP elevated to 5.2.  Plan was to discharge the patient home from the emergency department but he felt dizzy and off balance while  ambulating and was transferred to [facility] for ongoing evaluation and management.  Assessment & Plan:   Principal Problem:   Dizziness Active Problems:   Chronic combined systolic and diastolic CHF (congestive heart failure) (HCC)   VT (ventricular tachycardia) (HCC)   Anxiety state   Coronary artery disease involving coronary bypass graft without angina pectoris   CKD (chronic kidney disease), stage IV (HCC)   Chronic thoracic back pain (1ry area of Pain) (Right)   Hypothyroid   ICD (implantable cardioverter-defibrillator) in place   Hypokalemia   COVID-19 virus infection   Fall  1. Frequent falls  Orthostatic Hypotension - Presents after [name] fall at home, reports frequent falls for the past 1.5 years (after extended exertion on trails), but yesterday's event was different in that he had preceding nausea and lightheadedness and has been feeling generally weak (and occurred at home) -> suspect this episode 2/2 orthostatic hypotension in setting of dehydration/hypovolemia - No acute findings on CT head, no acute neurologic deficits noted   - Normal device function and no arrhythmias on device interrogation per most recent cardiology note  - He is scheduled for TTE and stress-test on 2/1 and will see neurology in March  - will obtain echo in house (pending) - Recent worsening likely related to COVID infection and dehydration   - Check orthostatics positive (dropped to 87 systolic when standing [id number]), hold diuretics -> bolus 1 L at 250 cc/hr and follow repeat orthostatics, continue cardiac monitoring, fall precautions, consult PT for eval and tx    # Fever Likely 2/2  covid infection, continue to monitor  # Elevated LFT's Likely 2/2 covid vs remdesivir given new onset with hospitalization in setting of covid (with concern for dehydration/orthostasis ?shock liver - vitals don't necessarily support this).  Stop tylenol, hold amiodarone and crestor and allopurinol for now  (discussed with pharmacy).   RUQ [place] without abnormality seen in RUQ Follow acute hepatitis panel - negative Trend - worsening today, continue to monitor  2. AKI on CKD IV  - SCr is 4.81 in ED, up from 2.76 in December 2021  - 5.09 today, follow after IVF - Renally-dose medications, hold diuretics, monitor    3. Ischemic cardiomyopathy; VT  - EF 25-30% in June 2019, has ICD in place and managed with amiodarone and mexilitine  - Normal device function and no arrhythmias on recent device interrogation per cardiology note last month  - Appears compensated, hold diuretics initially given increased SCr, monitor weight   - repeat echo   4. COVID-19  - Patient received 3rd vaccination 05/29/20  - No significant respiratory symptoms, CXR clear - inflammatory marker rising today, continue to monitor - Due to age, heart disease, and kidney disease, he is high-risk for severe disease and 3-day course of remdesivir was recommended - stopped now due to LFT bump Elevated dimer, follow LE [place] (negative LE [place])  COVID-19 Labs  Recent Labs    08/17/20 0638 08/18/20 0334 08/19/20 0302  DDIMER 1.20* 2.32*  --   FERRITIN 617* 2,273* 2,036*  CRP 5.2* 9.2* 12.1*    Lab Results  Component Value Date   SARSCOV2NAA POSITIVE (Gentry Seeber) 08/17/2020   5. Hypokalemia  - Serum potassium 3.1 in ED  - improved, follow  6. Chronic pain  - No pain complaints on admission, continue home medications (Norco)   7. Anxiety  - Continue home medications (Klonopin, as-needed Vistaril)    8. Hypothyroidism  - Continue Synthroid    Age indeterminate fx maxillary teeth Follow outpatient  DVT prophylaxis: heparin Code Status: full  Family Communication: none at bedsdie Disposition:   Status is: Observation  The patient will require care spanning > 2 midnights and should be moved to inpatient because: Inpatient level of care appropriate due to severity of illness  Dispo: The patient is from: Home               Anticipated d/c is to: Home              Anticipated d/c date is: > 3 days              Patient currently is not medically stable to d/c.       Consultants:   none  Procedures:   none  Antimicrobials:  Anti-infectives (From admission, onward)   Start     Dose/Rate Route Frequency Ordered Stop   08/19/20 1000  remdesivir 100 mg in sodium chloride 0.9 % 100 mL IVPB  Status:  Discontinued       "Followed by" Linked Group Details   100 mg 200 mL/hr over 30 Minutes Intravenous Daily 08/18/20 0039 08/18/20 0734   08/18/20 1000  remdesivir 100 mg in sodium chloride 0.9 % 100 mL IVPB  Status:  Discontinued       "Followed by" Linked Group Details   100 mg 200 mL/hr over 30 Minutes Intravenous Daily 08/17/20 2207 08/18/20 0039   08/17/20 2300  remdesivir 200 mg in sodium chloride 0.9% 250 mL IVPB       "Followed by" Linked Group Details  200 mg 580 mL/hr over 30 Minutes Intravenous Once 08/17/20 2207 08/18/20 0037       Subjective: No new complaints today, feels better after IVF  Objective: Vitals:   08/19/20 0455 08/19/20 1522 08/19/20 1523 08/19/20 1529  BP: 117/81 108/74 105/80 111/77  Pulse: 77 63 68 73  Resp: 20 (!) 22  (!) 22  Temp: 98.4 F (36.9 C)  97.9 F (36.6 C) 97.9 F (36.6 C)  TempSrc: Oral  Oral   SpO2: 94% 95% 98%   Weight: 79.8 kg     Height:        Intake/Output Summary (Last 24 hours) at 08/19/2020 1550 Last data filed at 08/19/2020 1414 Gross per 24 hour  Intake 524.31 ml  Output 400 ml  Net 124.31 ml   Filed Weights   08/18/20 0313 08/18/20 0553 08/19/20 0455  Weight: 80 kg 80 kg 79.8 kg    Examination:  General: No acute distress. Cardiovascular: Heart sounds show Dagmar Adcox regular rate, and rhythm. Lungs: Clear to auscultation bilaterally Abdomen: Soft, nontender, nondistended  Neurological: Alert and oriented 3. Moves all extremities 4 . Cranial nerves II through XII grossly intact. Skin: Warm and dry. No rashes or  lesions. Extremities: No clubbing or cyanosis. No edema.  Data Reviewed: I have personally reviewed following labs and imaging studies  CBC: Recent Labs  Lab 08/16/20 2323 08/18/20 0334 08/19/20 0302  WBC 8.0 6.1 6.8  NEUTROABS 7.1  --   --   HGB 16.8 15.7 15.4  HCT 51.2 49.1 48.2  MCV 97.5 98.4 100.6*  PLT 167 132* 137*    Basic Metabolic Panel: Recent Labs  Lab 08/16/20 2323 08/18/20 0334 08/19/20 0302  NA 136 138 140  K 3.1* 4.0 3.9  CL 96* 100 102  CO2 '25 25 22  '$ GLUCOSE 104* 87 83  BUN 37* 39* 43*  CREATININE 4.81* 4.89* 5.09*  CALCIUM 9.5 9.2 8.8*  MG  --  2.1  --   PHOS  --  3.4  --     GFR: Estimated Creatinine Clearance: 15.3 mL/min (Zhane Bluitt) (by C-G formula based on SCr of 5.09 mg/dL (H)).  Liver Function Tests: Recent Labs  Lab 08/16/20 2323 08/18/20 0334 08/19/20 0302  AST 72* 685* 757*  ALT 55* 621* 726*  ALKPHOS 129* 119 115  BILITOT 0.9 1.1 1.0  PROT 8.7* 7.1 6.8  ALBUMIN 4.4 3.7 3.5    CBG: Recent Labs  Lab 08/17/20 0123  GLUCAP 75     Recent Results (from the past 240 hour(s))  Resp Panel by RT-PCR (Flu Freeda Spivey&B, Covid) Nasopharyngeal Swab     Status: Abnormal   Collection Time: 08/17/20  4:26 AM   Specimen: Nasopharyngeal Swab; Nasopharyngeal(NP) swabs in vial transport medium  Result Value Ref Range Status   SARS Coronavirus 2 by RT PCR POSITIVE (Jenella Craigie) NEGATIVE Final    Comment: RESULT CALLED TO, READ BACK BY AND VERIFIED WITH: NEAL,K AT 0516 ON [id number] BY CHERESNOWSKY,T (NOTE) SARS-CoV-2 target nucleic acids are DETECTED.  The SARS-CoV-2 RNA is generally detectable in upper respiratory specimens during the acute phase of infection. Positive results are indicative of the presence of the identified virus, but do not rule out bacterial infection or co-infection with other pathogens not detected by the test. Clinical correlation with patient history and other diagnostic information is necessary to determine patient infection status. The  expected result is Negative.  Fact Sheet for Patients: EntrepreneurPulse.com.au  Fact Sheet for Healthcare Providers: IncredibleEmployment.be  This test is not  yet approved or cleared by the [place] and  has been authorized for detection and/or diagnosis of SARS-CoV-2 by FDA under an Emergency Use Authorization (EUA).  This EUA will remain in effect (meaning this te st can be used) for the duration of  the COVID-19 declaration under Section 564(b)(1) of the Act, 21 U.S.C. section 360bbb-3(b)(1), unless the authorization is terminated or revoked sooner.     Influenza Unknown [place] by PCR NEGATIVE NEGATIVE Final   Influenza B by PCR NEGATIVE NEGATIVE Final    Comment: (NOTE) The Xpert Xpress SARS-CoV-2/FLU/RSV plus assay is intended as an aid in the diagnosis of influenza from Nasopharyngeal swab specimens and should not be used as Jrue Yambao sole basis for treatment. Nasal washings and aspirates are unacceptable for Xpert Xpress SARS-CoV-2/FLU/RSV testing.  Fact Sheet for Patients: EntrepreneurPulse.com.au  Fact Sheet for Healthcare Providers: IncredibleEmployment.be  This test is not yet approved or cleared by the [place] FDA and has been authorized for detection and/or diagnosis of SARS-CoV-2 by FDA under an Emergency Use Authorization (EUA). This EUA will remain in effect (meaning this test can be used) for the duration of the COVID-19 declaration under Section 564(b)(1) of the Act, 21 U.S.C. section 360bbb-3(b)(1), unless the authorization is terminated or revoked.  Performed at [facility], [address] [place], [place] [address]          Radiology Studies: US RENAL  Result Date: 08/19/2020 CLINICAL DATA:  AKI EXAM: RENAL / URINARY TRACT ULTRASOUND COMPLETE COMPARISON:  03/23/2020 FINDINGS: Right Kidney: Renal measurements: 10.5 x 4.9 x 5.2 cm = volume: 140 mL. Increased cortical  echogenicity. Thinly septated cyst of the inferior pole of the right kidney. No mass or hydronephrosis visualized. Left Kidney: Renal measurements: 12.0 x 5.2 x 3.8 cm = volume: 125 mL. Increased cortical echogenicity. 5 mm shadowing calculus in the midportion. Simple cysts of the superior pole of the left kidney. No mass or hydronephrosis visualized. Bladder: Appears normal for degree of bladder distention. Other: None. IMPRESSION: 1. Increased cortical echogenicity of the kidneys, in keeping with medical renal disease. No hydronephrosis. 2.  Nonobstructive left nephrolithiasis. Electronically Signed   By: Eddie Candle M.D.   On: 08/19/2020 08:21   VAS [place] LOWER EXTREMITY VENOUS (DVT)  Result Date: 08/19/2020  Lower Venous DVT Study Indications: Covid-19, d-dimer.  Comparison Study: No prior studies. Performing Technologist: Darlin Coco RDMS  Examination Guidelines: Harveer Sadler complete evaluation includes B-mode imaging, spectral Doppler, color Doppler, and power Doppler as needed of all accessible portions of each vessel. Bilateral testing is considered an integral part of Flavia Bruss complete examination. Limited examinations for reoccurring indications may be performed as noted. The reflux portion of the exam is performed with the patient in reverse Trendelenburg.  +---------+---------------+---------+-----------+----------+--------------+ RIGHT    CompressibilityPhasicitySpontaneityPropertiesThrombus Aging +---------+---------------+---------+-----------+----------+--------------+ CFV      Full           Yes      Yes                                 +---------+---------------+---------+-----------+----------+--------------+ SFJ      Full                                                        +---------+---------------+---------+-----------+----------+--------------+  FV Prox  Full                                                         +---------+---------------+---------+-----------+----------+--------------+ FV Mid   Full                                                        +---------+---------------+---------+-----------+----------+--------------+ FV DistalFull                                                        +---------+---------------+---------+-----------+----------+--------------+ PFV      Full                                                        +---------+---------------+---------+-----------+----------+--------------+ POP      Full           Yes      Yes                                 +---------+---------------+---------+-----------+----------+--------------+ PTV      Full                                                        +---------+---------------+---------+-----------+----------+--------------+ PERO     Full                                                        +---------+---------------+---------+-----------+----------+--------------+   +---------+---------------+---------+-----------+----------+--------------+ LEFT     CompressibilityPhasicitySpontaneityPropertiesThrombus Aging +---------+---------------+---------+-----------+----------+--------------+ CFV      Full           Yes      Yes                                 +---------+---------------+---------+-----------+----------+--------------+ SFJ      Full                                                        +---------+---------------+---------+-----------+----------+--------------+ FV Prox  Full                                                        +---------+---------------+---------+-----------+----------+--------------+  FV Mid   Full                                                        +---------+---------------+---------+-----------+----------+--------------+ FV DistalFull                                                         +---------+---------------+---------+-----------+----------+--------------+ PFV      Full                                                        +---------+---------------+---------+-----------+----------+--------------+ POP      Full           Yes      Yes                                 +---------+---------------+---------+-----------+----------+--------------+ PTV      Full                                                        +---------+---------------+---------+-----------+----------+--------------+ PERO     Full                                                        +---------+---------------+---------+-----------+----------+--------------+     Summary: RIGHT: - There is no evidence of deep vein thrombosis in the lower extremity.  - No cystic structure found in the popliteal fossa.  LEFT: - There is no evidence of deep vein thrombosis in the lower extremity.  - No cystic structure found in the popliteal fossa.  *See table(s) above for measurements and observations.    Preliminary    US Abdomen Limited RUQ (LIVER/GB)  Result Date: 08/18/2020 CLINICAL DATA:  Elevated liver function test. EXAM: ULTRASOUND ABDOMEN LIMITED RIGHT UPPER QUADRANT COMPARISON:  March 24, 2019. FINDINGS: Gallbladder: Status post cholecystectomy. Common bile duct: Diameter: 2 mm which is within normal limits. Liver: No focal lesion identified. Within normal limits in parenchymal echogenicity. Portal vein is patent on color Doppler imaging with normal direction of blood flow towards the liver. Other: None. IMPRESSION: Status post cholecystectomy. No other abnormality seen in the right upper quadrant of the abdomen. Electronically Signed   By: Marijo Conception M.D.   On: 08/18/2020 10:26        Scheduled Meds: . aspirin EC  81 mg Oral Daily  . clonazePAM  1 mg Oral Q1500  . clonazePAM  1.5 mg Oral QHS  . heparin  5,000 Units Subcutaneous Q8H  . levothyroxine  88 mcg Oral Daily  . mexiletine  250  mg Oral TID  . oxyCODONE  5 mg Oral TID   Continuous Infusions: . sodium chloride Stopped (08/18/20 0043)     LOS: 1 day    Time spent: over 30 min    Fayrene Helper, MD [facility]   To contact the attending provider between 7A-7P or the covering provider during after hours 7P-7A, please log into the web site [email or web address] and access using universal [place] password for that web site. If you do not have the password, please call the hospital operator.  08/19/2020, 3:50 PM

## 2020-08-19 NOTE — CV Procedure (Signed)
2D echo attempted, but patient having procedure.

## 2020-08-19 NOTE — Progress Notes (Signed)
  Echocardiogram 2D Echocardiogram has been performed.  Richard Cook 08/19/2020, 2:14 PM

## 2020-08-20 LAB — COMPREHENSIVE METABOLIC PANEL
ALT: 751 U/L — ABNORMAL HIGH (ref 0–44)
AST: 901 U/L — ABNORMAL HIGH (ref 15–41)
Albumin: 3 g/dL — ABNORMAL LOW (ref 3.5–5.0)
Alkaline Phosphatase: 114 U/L (ref 38–126)
Anion gap: 11 (ref 5–15)
BUN: 47 mg/dL — ABNORMAL HIGH (ref 8–23)
CO2: 22 mmol/L (ref 22–32)
Calcium: 8.7 mg/dL — ABNORMAL LOW (ref 8.9–10.3)
Chloride: 104 mmol/L (ref 98–111)
Creatinine, Ser: 5.02 mg/dL — ABNORMAL HIGH (ref 0.61–1.24)
GFR, Estimated: 12 mL/min — ABNORMAL LOW (ref >60)
Glucose, Bld: 77 mg/dL (ref 70–99)
Potassium: 4 mmol/L (ref 3.5–5.1)
Sodium: 137 mmol/L (ref 135–145)
Total Bilirubin: 1 mg/dL (ref 0.3–1.2)
Total Protein: 5.9 g/dL — ABNORMAL LOW (ref 6.5–8.1)

## 2020-08-20 LAB — CBC
HCT: 41.7 % (ref 39.0–52.0)
Hemoglobin: 13.4 g/dL (ref 13.0–17.0)
MCH: 31.8 pg (ref 26.0–34.0)
MCHC: 32.1 g/dL (ref 30.0–36.0)
MCV: 99 fL (ref 80.0–100.0)
Platelets: 109 10*3/uL — ABNORMAL LOW (ref 150–400)
RBC: 4.21 MIL/uL — ABNORMAL LOW (ref 4.22–5.81)
RDW: 13.8 % (ref 11.5–15.5)
WBC: 5 10*3/uL (ref 4.0–10.5)
nRBC: 0 % (ref 0.0–0.2)

## 2020-08-20 LAB — C-REACTIVE PROTEIN: CRP: 9.3 mg/dL — ABNORMAL HIGH (ref <1.0)

## 2020-08-20 LAB — PROTIME-INR
INR: 1.1 (ref 0.8–1.2)
Prothrombin Time: 13.8 seconds (ref 11.4–15.2)

## 2020-08-20 LAB — D-DIMER, QUANTITATIVE: D-Dimer, Quant: 1.09 ug/mL-FEU — ABNORMAL HIGH (ref 0.00–0.50)

## 2020-08-20 LAB — FERRITIN: Ferritin: 2092 ng/mL — ABNORMAL HIGH (ref 24–336)

## 2020-08-20 MED ORDER — LACTATED RINGERS IV SOLN: INTRAVENOUS | Status: AC

## 2020-08-20 NOTE — Telephone Encounter (Signed)
ICM remote transmission scheduled 09/06/2020 since patient is currently hospitalized.

## 2020-08-20 NOTE — Telephone Encounter (Signed)
Spoke to patient to advise Dr. [name] would like for patient to follow up with [facility]. Patient is currently in the hospital for recent dizziness. Patient agreeable to plan.

## 2020-08-20 NOTE — Telephone Encounter (Signed)
Spoke with  pharmacist and advised per Dr Olin Pia last note 12/21 pt is supposed to be taking Amiodarone and Mexiletine.  Pharmacist thanked RN for phone call and will pass along information as pt has had recent hospital admission.
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
## 2020-08-20 NOTE — Progress Notes (Signed)
PROGRESS NOTE    [name]  [id number] DOB: 12-23-1955 DOA: 08/16/2020 PCP: Birdie Sons, MD  Chief Complaint  Patient presents with  . Fall    Brief Narrative:  Richard Cook is Richard Cook 65 y.o. male with medical history significant for coronary artery disease, chronic systolic and diastolic CHF, ventricular tachycardia with ICD, chronic kidney disease stage IV, hypothyroidism, anxiety, and chronic pain, now presenting to the emergency department after [name] fall at home.  Patient reports frequent falls over the past 1.5 years, usually while walking and feels that his legs give out, but yesterday's fall was different and that it was preceded by nausea and lightheadedness and he has also been feeling generally weak.  General weakness, malaise, and nausea began yesterday, he thought he would feel better if he ate something, but when he began to walk to the kitchen, developed acute lightheadedness and fell, striking his right face and head.  He denies any loss of consciousness.  Denies any focal numbness or weakness.  Had not been coughing until he presented to the hospital and denies any shortness of breath.  Reports frequent left ankle swelling but not currently.  Denies chest pain or palpitations.  [facility] ED Course: Upon arrival to the ED, patient is found to be afebrile, saturating mid 90s on room air, and with stable blood pressure.  EKG features sinus rhythm with nonspecific IVCD.  Chest x-ray is negative for acute cardiopulmonary disease.  Head CT negative for acute intracranial abnormality.  No acute displaced fracture, maxillofacial CT and no acute findings on CT cervical spine.  Chemistry panel notable for potassium 3.1, AST 72, ALT 55, and creatinine 4.81, up from 2.76 in December.  CBC is unremarkable.  COVID-19 PCR is positive.  CRP elevated to 5.2.  Plan was to discharge the patient home from the emergency department but he felt dizzy and off balance while  ambulating and was transferred to [facility] for ongoing evaluation and management.  Assessment & Plan:   Principal Problem:   Dizziness Active Problems:   Chronic combined systolic and diastolic CHF (congestive heart failure) (HCC)   VT (ventricular tachycardia) (HCC)   Anxiety state   Coronary artery disease involving coronary bypass graft without angina pectoris   CKD (chronic kidney disease), stage IV (HCC)   Chronic thoracic back pain (1ry area of Pain) (Right)   Hypothyroid   ICD (implantable cardioverter-defibrillator) in place   Hypokalemia   COVID-19 virus infection   Fall  1. Frequent falls  Orthostatic Hypotension - Presents after Richard Cook fall at home, reports frequent falls for the past 1.5 years (after extended exertion on trails), but yesterday's event was different in that he had preceding nausea and lightheadedness and has been feeling generally weak (and occurred at home) -> suspect this episode 2/2 orthostatic hypotension in setting of dehydration/hypovolemia - No acute findings on CT head, no acute neurologic deficits noted   - Normal device function and no arrhythmias on device interrogation per most recent cardiology note -> consider discussion with cardiology while in house if persistent symptoms - He is scheduled for TTE and stress-test on 2/1 and will see neurology in March  - will obtain echo in house (pending) - Recent worsening likely related to COVID infection and dehydration   - Check orthostatics positive (dropped to 87 systolic when standing [id number]), hold diuretics.  Orthostatics improved 1/17, ordered and pending 1/18.  Some continued LH today, another 500 cc over 5 hrs,  follow symptoms. continue cardiac monitoring, fall precautions, consult PT for eval and tx    # Fever Likely 2/2 covid infection, continue to monitor Last fever 1/16, follow  # Elevated LFT's Likely 2/2 covid vs remdesivir given new onset with hospitalization in setting of covid  (with concern for dehydration/orthostasis ?shock liver - vitals don't necessarily support this).  Stop tylenol, hold amiodarone and crestor and allopurinol for now (discussed with pharmacy).   RUQ [place] without abnormality seen in RUQ Follow acute hepatitis panel - negative Trend - worsening today - still trending up, continue to monitor - informal discussion today with GI, suspect this is likely related to infection/meds/heart failure/hemodynamics - will continue to monitor for now, but if continuing to worsen, consider formal GI consult  2. AKI on CKD IV  - SCr is 4.81 in ED, up from 2.76 in December 2021 in care everywhere (our most recent lab prior to this admission was 4.8 in August 2021) - relatively stable today to 5.02 -> consider renal consult if not improving - renal US with medical renal disease, no hydro, UA with 100 mg/dl protein - Renally-dose medications, hold diuretics, monitor   - adequate UOP charted  3. Ischemic cardiomyopathy; VT  - EF 25-30% in June 2019, has ICD in place and managed with amiodarone and mexilitine  - Normal device function and no arrhythmias on recent device interrogation per cardiology note last month  - Appears compensated, hold diuretics initially given increased SCr, monitor weight   - repeat echo with EF [id number], grade 1 diastolic dysfunction, mildly reduce RVSF, mildly elevated PASP  4. COVID-19  - Patient received 3rd vaccination 05/29/20  - No significant respiratory symptoms, CXR clear - follow inflammatory markers - Due to age, heart disease, and kidney disease, he is high-risk for severe disease and 3-day course of remdesivir was recommended - stopped now due to LFT bump Elevated dimer, follow LE [place] (negative LE [place])  COVID-19 Labs  Recent Labs    08/18/20 0334 08/19/20 0302 08/20/20 0405  DDIMER 2.32*  --  1.09*  FERRITIN 2,273* 2,036* 2,092*  CRP 9.2* 12.1* 9.3*    Lab Results  Component Value Date   SARSCOV2NAA POSITIVE (Jesilyn Cook)  08/17/2020   5. Hypokalemia  - Serum potassium 3.1 in ED  - improved, follow  6. Chronic pain  - No pain complaints on admission, continue home medications (Norco)   7. Anxiety  - Continue home medications (Klonopin, as-needed Vistaril)    8. Hypothyroidism  - Continue Synthroid    Age indeterminate fx maxillary teeth Follow outpatient  # Thrombocytopenia: likely 2/2 covid infection, continue to monitor for now. 4t score is 2 today.  DVT prophylaxis: heparin Code Status: full  Family Communication: none at bedsdie Disposition:   Status is: Observation  The patient will require care spanning > 2 midnights and should be moved to inpatient because: Inpatient level of care appropriate due to severity of illness  Dispo: The patient is from: Home              Anticipated d/c is to: Home              Anticipated d/c date is: > 3 days              Patient currently is not medically stable to d/c.       Consultants:   none  Procedures:   none  Antimicrobials:  Anti-infectives (From admission, onward)   Start     Dose/Rate  Route Frequency Ordered Stop   08/19/20 1000  remdesivir 100 mg in sodium chloride 0.9 % 100 mL IVPB  Status:  Discontinued       "Followed by" Linked Group Details   100 mg 200 mL/hr over 30 Minutes Intravenous Daily 08/18/20 0039 08/18/20 0734   08/18/20 1000  remdesivir 100 mg in sodium chloride 0.9 % 100 mL IVPB  Status:  Discontinued       "Followed by" Linked Group Details   100 mg 200 mL/hr over 30 Minutes Intravenous Daily 08/17/20 2207 08/18/20 0039   08/17/20 2300  remdesivir 200 mg in sodium chloride 0.9% 250 mL IVPB       "Followed by" Linked Group Details   200 mg 580 mL/hr over 30 Minutes Intravenous Once 08/17/20 2207 08/18/20 0037       Subjective: No new complaints Some lightheadedness when up and walking  Objective: Vitals:   08/20/20 0606 08/20/20 1658 08/20/20 1659 08/20/20 1701  BP: 107/71 125/78 113/88 103/79   Pulse: 61 76 82 89  Resp: 16 16 (!) 22 (!) 22  Temp: 97.6 F (36.4 C) 98.3 F (36.8 C) 98.5 F (36.9 C) 98.5 F (36.9 C)  TempSrc: Oral Oral  Oral  SpO2: 95% 95% 96% 95%  Weight:      Height:        Intake/Output Summary (Last 24 hours) at 08/20/2020 1951 Last data filed at 08/20/2020 1839 Gross per 24 hour  Intake 1064 ml  Output 1700 ml  Net -636 ml   Filed Weights   08/18/20 0553 08/19/20 0455 08/20/20 0500  Weight: 80 kg 79.8 kg 78.3 kg    Examination:  General: No acute distress. Cardiovascular: Heart sounds show Noriko Macari regular rate, and rhythm Lungs: Clear to auscultation bilaterally Abdomen: Soft, nontender, nondistended Neurological: Alert and oriented 3. Moves all extremities 4. Cranial nerves II through XII grossly intact. Skin: Warm and dry. No rashes or lesions. Extremities: No clubbing or cyanosis. No edema.    Data Reviewed: I have personally reviewed following labs and imaging studies  CBC: Recent Labs  Lab 08/16/20 2323 08/18/20 0334 08/19/20 0302 08/20/20 0405  WBC 8.0 6.1 6.8 5.0  NEUTROABS 7.1  --   --   --   HGB 16.8 15.7 15.4 13.4  HCT 51.2 49.1 48.2 41.7  MCV 97.5 98.4 100.6* 99.0  PLT 167 132* 137* 109*    Basic Metabolic Panel: Recent Labs  Lab 08/16/20 2323 08/18/20 0334 08/19/20 0302 08/20/20 0405  NA 136 138 140 137  K 3.1* 4.0 3.9 4.0  CL 96* 100 102 104  CO2 '25 25 22 22  '$ GLUCOSE 104* 87 83 77  BUN 37* 39* 43* 47*  CREATININE 4.81* 4.89* 5.09* 5.02*  CALCIUM 9.5 9.2 8.8* 8.7*  MG  --  2.1  --   --   PHOS  --  3.4  --   --     GFR: Estimated Creatinine Clearance: 15.6 mL/min (Carollee Nussbaumer) (by C-G formula based on SCr of 5.02 mg/dL (H)).  Liver Function Tests: Recent Labs  Lab 08/16/20 2323 08/18/20 0334 08/19/20 0302 08/20/20 0405  AST 72* 685* 757* 901*  ALT 55* 621* 726* 751*  ALKPHOS 129* 119 115 114  BILITOT 0.9 1.1 1.0 1.0  PROT 8.7* 7.1 6.8 5.9*  ALBUMIN 4.4 3.7 3.5 3.0*    CBG: Recent Labs  Lab  08/17/20 0123  GLUCAP 75     Recent Results (from the past 240 hour(s))  Resp Panel  by RT-PCR (Flu Glennys Schorsch&B, Covid) Nasopharyngeal Swab     Status: Abnormal   Collection Time: 08/17/20  4:26 AM   Specimen: Nasopharyngeal Swab; Nasopharyngeal(NP) swabs in vial transport medium  Result Value Ref Range Status   SARS Coronavirus 2 by RT PCR POSITIVE (Mukund Weinreb) NEGATIVE Final    Comment: RESULT CALLED TO, READ BACK BY AND VERIFIED WITH: NEAL,K AT 0516 ON [id number] BY CHERESNOWSKY,T (NOTE) SARS-CoV-2 target nucleic acids are DETECTED.  The SARS-CoV-2 RNA is generally detectable in upper respiratory specimens during the acute phase of infection. Positive results are indicative of the presence of the identified virus, but do not rule out bacterial infection or co-infection with other pathogens not detected by the test. Clinical correlation with patient history and other diagnostic information is necessary to determine patient infection status. The expected result is Negative.  Fact Sheet for Patients: EntrepreneurPulse.com.au  Fact Sheet for Healthcare Providers: IncredibleEmployment.be  This test is not yet approved or cleared by the [place] FDA and  has been authorized for detection and/or diagnosis of SARS-CoV-2 by FDA under an Emergency Use Authorization (EUA).  This EUA will remain in effect (meaning this te st can be used) for the duration of  the COVID-19 declaration under Section 564(b)(1) of the Act, 21 U.S.C. section 360bbb-3(b)(1), unless the authorization is terminated or revoked sooner.     Influenza Delontae Lamm by PCR NEGATIVE NEGATIVE Final   Influenza B by PCR NEGATIVE NEGATIVE Final    Comment: (NOTE) The Xpert Xpress SARS-CoV-2/FLU/RSV plus assay is intended as an aid in the diagnosis of influenza from Nasopharyngeal swab specimens and should not be used as Zyheir Daft sole basis for treatment. Nasal washings and aspirates are unacceptable for Xpert  Xpress SARS-CoV-2/FLU/RSV testing.  Fact Sheet for Patients: EntrepreneurPulse.com.au  Fact Sheet for Healthcare Providers: IncredibleEmployment.be  This test is not yet approved or cleared by the [place] FDA and has been authorized for detection and/or diagnosis of SARS-CoV-2 by FDA under an Emergency Use Authorization (EUA). This EUA will remain in effect (meaning this test can be used) for the duration of the COVID-19 declaration under Section 564(b)(1) of the Act, 21 U.S.C. section 360bbb-3(b)(1), unless the authorization is terminated or revoked.  Performed at [facility], [address], [place], [place] [address]          Radiology Studies: US RENAL  Result Date: 08/19/2020 CLINICAL DATA:  AKI EXAM: RENAL / URINARY TRACT ULTRASOUND COMPLETE COMPARISON:  03/23/2020 FINDINGS: Right Kidney: Renal measurements: 10.5 x 4.9 x 5.2 cm = volume: 140 mL. Increased cortical echogenicity. Thinly septated cyst of the inferior pole of the right kidney. No mass or hydronephrosis visualized. Left Kidney: Renal measurements: 12.0 x 5.2 x 3.8 cm = volume: 125 mL. Increased cortical echogenicity. 5 mm shadowing calculus in the midportion. Simple cysts of the superior pole of the left kidney. No mass or hydronephrosis visualized. Bladder: Appears normal for degree of bladder distention. Other: None. IMPRESSION: 1. Increased cortical echogenicity of the kidneys, in keeping with medical renal disease. No hydronephrosis. 2.  Nonobstructive left nephrolithiasis. Electronically Signed   By: Eddie Candle M.D.   On: 08/19/2020 08:21   ECHOCARDIOGRAM COMPLETE  Result Date: 08/19/2020    ECHOCARDIOGRAM REPORT   Patient Name:   MAXIMUM MURNAN Date of Exam: 08/19/2020 Medical Rec #:  [id number]            Height:       70.0 in Accession #:    [id number]  Weight:       175.9 lb Date of Birth:  06-Jun-1956            BSA:          1.977 m  Patient Age:    71 years             BP:           117/81 mmHg Patient Gender: M                    HR:           66 bpm. Exam Location:  Inpatient Procedure: 2D Echo, Color Doppler, Limited Color Doppler and Intracardiac            Opacification Agent Indications:    I50.40* Unspecified combined systolic (congestive) and diastolic                 (congestive) heart failure  History:        Patient has prior history of Echocardiogram examinations, most                 recent 01/12/2018. CHF and Cardiomyopathy, CAD and Previous                 Myocardial Infarction, Defibrillator, Abnormal ECG and Prior                 CABG, Arrythmias:VT, Signs/Symptoms:Syncope; Risk                 Factors:Hypertension and Dyslipidemia.  Sonographer:    Roseanna Rainbow RDCS Referring Phys: [phone] Izaias Krupka CAL[place]  1. Left ventricular ejection fraction, by estimation, is 30 to 35%. The left ventricle has moderately decreased function. The left ventricle demonstrates global hypokinesis. Left ventricular diastolic parameters are consistent with Grade I diastolic dysfunction (impaired relaxation).  2. Right ventricular systolic function is mildly reduced. The right ventricular size is normal. There is mildly elevated pulmonary artery systolic pressure. The estimated right ventricular systolic pressure is Q000111Q mmHg.  3. The mitral valve is grossly normal. Trivial mitral valve regurgitation. No evidence of mitral stenosis.  4. The aortic valve is grossly normal. Aortic valve regurgitation is mild. No aortic stenosis is present.  5. Aortic dilatation noted. There is borderline dilatation of the ascending aorta, measuring 38 mm.  6. The inferior vena cava is dilated in size with <50% respiratory variability, suggesting right atrial pressure of 15 mmHg. Conclusion(s)/Recommendation(s): No left ventricular mural or apical thrombus/thrombi. FINDINGS  Left Ventricle: Left ventricular ejection fraction, by estimation, is 30 to 35%. The  left ventricle has moderately decreased function. The left ventricle demonstrates global hypokinesis. Definity contrast agent was given IV to delineate the left ventricular endocardial borders. The left ventricular internal cavity size was normal in size. There is no left ventricular hypertrophy. Left ventricular diastolic parameters are consistent with Grade I diastolic dysfunction (impaired relaxation).  LV Wall Scoring: The apical lateral segment, apical septal segment, apical anterior segment, and apical inferior segment are akinetic. Right Ventricle: The right ventricular size is normal. No increase in right ventricular wall thickness. Right ventricular systolic function is mildly reduced. There is mildly elevated pulmonary artery systolic pressure. The tricuspid regurgitant velocity  is 2.60 m/s, and with an assumed right atrial pressure of 15 mmHg, the estimated right ventricular systolic pressure is Q000111Q mmHg. Left Atrium: Left atrial size was normal in size. Right Atrium: Right atrial size was normal in size. Pericardium: There is no  evidence of pericardial effusion. Presence of pericardial fat pad. Mitral Valve: The mitral valve is grossly normal. Mild mitral annular calcification. Trivial mitral valve regurgitation. No evidence of mitral valve stenosis. Tricuspid Valve: The tricuspid valve is normal in structure. Tricuspid valve regurgitation is mild . No evidence of tricuspid stenosis. Aortic Valve: The aortic valve is grossly normal. Aortic valve regurgitation is mild. No aortic stenosis is present. Pulmonic Valve: The pulmonic valve was grossly normal. Pulmonic valve regurgitation is trivial. No evidence of pulmonic stenosis. Aorta: Aortic dilatation noted. There is borderline dilatation of the ascending aorta, measuring 38 mm. Venous: The inferior vena cava was not well visualized. The inferior vena cava is dilated in size with less than 50% respiratory variability, suggesting right atrial pressure of  15 mmHg. IAS/Shunts: The interatrial septum was not well visualized. Additional Comments: Frankie Zito pacer wire is visualized in the right atrium and right ventricle.  LEFT VENTRICLE PLAX 2D LVIDd:         5.40 cm      Diastology LVIDs:         4.10 cm      LV e' medial:    3.57 cm/s LV PW:         0.90 cm      LV E/e' medial:  11.7 LV IVS:        0.90 cm      LV e' lateral:   10.60 cm/s LVOT diam:     2.10 cm      LV E/e' lateral: 3.9 LV SV:         46 LV SV Index:   23 LVOT Area:     3.46 cm  LV Volumes (MOD) LV vol d, MOD A2C: 176.0 ml LV vol d, MOD A4C: 147.3 ml LV vol s, MOD A2C: 111.0 ml LV vol s, MOD A4C: 84.9 ml LV SV MOD A2C:     65.0 ml LV SV MOD A4C:     147.3 ml LV SV MOD BP:      66.7 ml RIGHT VENTRICLE            IVC RV S prime:     7.62 cm/s  IVC diam: 2.70 cm TAPSE (M-mode): 1.5 cm LEFT ATRIUM             Index       RIGHT ATRIUM           Index LA diam:        3.60 cm 1.82 cm/m  RA Area:     13.90 cm LA Vol (A2C):   42.7 ml 21.60 ml/m RA Volume:   33.00 ml  16.70 ml/m LA Vol (A4C):   48.9 ml 24.74 ml/m LA Biplane Vol: 50.2 ml 25.40 ml/m  AORTIC VALVE LVOT Vmax:   78.30 cm/s LVOT Vmean:  47.300 cm/s LVOT VTI:    0.133 m  AORTA Ao Root diam: 3.60 cm Ao Asc diam:  3.80 cm MITRAL VALVE               TRICUSPID VALVE MV Area (PHT): 3.27 cm    TR Peak grad:   27.0 mmHg MV Decel Time: 232 msec    TR Vmax:        260.00 cm/s MV E velocity: 41.70 cm/s MV Ellis Mehaffey velocity: 76.20 cm/s  SHUNTS MV E/Margert Edsall ratio:  0.55        Systemic VTI:  0.13 m  Systemic Diam: 2.10 cm Cherlynn Kaiser MD Electronically signed by Cherlynn Kaiser MD Signature Date/Time: 08/19/2020/7:37:21 PM    Final    VAS [place] LOWER EXTREMITY VENOUS (DVT)  Result Date: 08/19/2020  Lower Venous DVT Study Indications: Covid-19, d-dimer.  Comparison Study: No prior studies. Performing Technologist: Darlin Coco RDMS  Examination Guidelines: Reana Chacko complete evaluation includes B-mode imaging, spectral Doppler, color Doppler, and power  Doppler as needed of all accessible portions of each vessel. Bilateral testing is considered an integral part of Tc [place]usta complete examination. Limited examinations for reoccurring indications may be performed as noted. The reflux portion of the exam is performed with the patient in reverse Trendelenburg.  +---------+---------------+---------+-----------+----------+--------------+ RIGHT    CompressibilityPhasicitySpontaneityPropertiesThrombus Aging +---------+---------------+---------+-----------+----------+--------------+ CFV      Full           Yes      Yes                                 +---------+---------------+---------+-----------+----------+--------------+ SFJ      Full                                                        +---------+---------------+---------+-----------+----------+--------------+ FV Prox  Full                                                        +---------+---------------+---------+-----------+----------+--------------+ FV Mid   Full                                                        +---------+---------------+---------+-----------+----------+--------------+ FV DistalFull                                                        +---------+---------------+---------+-----------+----------+--------------+ PFV      Full                                                        +---------+---------------+---------+-----------+----------+--------------+ POP      Full           Yes      Yes                                 +---------+---------------+---------+-----------+----------+--------------+ PTV      Full                                                        +---------+---------------+---------+-----------+----------+--------------+  PERO     Full                                                        +---------+---------------+---------+-----------+----------+--------------+    +---------+---------------+---------+-----------+----------+--------------+ LEFT     CompressibilityPhasicitySpontaneityPropertiesThrombus Aging +---------+---------------+---------+-----------+----------+--------------+ CFV      Full           Yes      Yes                                 +---------+---------------+---------+-----------+----------+--------------+ SFJ      Full                                                        +---------+---------------+---------+-----------+----------+--------------+ FV Prox  Full                                                        +---------+---------------+---------+-----------+----------+--------------+ FV Mid   Full                                                        +---------+---------------+---------+-----------+----------+--------------+ FV DistalFull                                                        +---------+---------------+---------+-----------+----------+--------------+ PFV      Full                                                        +---------+---------------+---------+-----------+----------+--------------+ POP      Full           Yes      Yes                                 +---------+---------------+---------+-----------+----------+--------------+ PTV      Full                                                        +---------+---------------+---------+-----------+----------+--------------+ PERO     Full                                                        +---------+---------------+---------+-----------+----------+--------------+  Summary: RIGHT: - There is no evidence of deep vein thrombosis in the lower extremity.  - No cystic structure found in the popliteal fossa.  LEFT: - There is no evidence of deep vein thrombosis in the lower extremity.  - No cystic structure found in the popliteal fossa.  *See table(s) above for measurements and observations. Electronically signed  by Ruta Hinds MD on 08/19/2020 at 5:17:49 PM.    Final         Scheduled Meds: . aspirin EC  81 mg Oral Daily  . clonazePAM  1 mg Oral Q1500  . clonazePAM  1.5 mg Oral QHS  . heparin  5,000 Units Subcutaneous Q8H  . levothyroxine  88 mcg Oral Daily  . mexiletine  250 mg Oral TID  . oxyCODONE  5 mg Oral TID   Continuous Infusions: . sodium chloride Stopped (08/18/20 0043)  . lactated ringers       LOS: 2 days    Time spent: over 30 min    Fayrene Helper, MD [facility]   To contact the attending provider between 7A-7P or the covering provider during after hours 7P-7A, please log into the web site [email or web address] and access using universal [place] password for that web site. If you do not have the password, please call the hospital operator.  08/20/2020, 7:51 PM

## 2020-08-21 DIAGNOSIS — E038 Other specified hypothyroidism: Secondary | ICD-10-CM

## 2020-08-21 DIAGNOSIS — R7989 Other specified abnormal findings of blood chemistry: Secondary | ICD-10-CM

## 2020-08-21 DIAGNOSIS — N179 Acute kidney failure, unspecified: Secondary | ICD-10-CM

## 2020-08-21 LAB — PATHOLOGIST SMEAR REVIEW

## 2020-08-21 MED ORDER — SODIUM CHLORIDE 0.9 % IV SOLN: INTRAVENOUS | Status: DC

## 2020-08-21 NOTE — Progress Notes (Signed)
PROGRESS NOTE    Brandyn Mccarther  [id number] DOB: 1955/10/23 DOA: 08/16/2020 PCP: Birdie Sons, MD     Brief Narrative:  [name] a 65 y.o.WM PMHx CAD, Chronic systolic and diastolic CHF, ventricular tachycardia with ICD, CKD stage IV, hypothyroidism, anxiety, and chronic pain,   Presenting to the emergency department after a fall at home.Patient reports frequent falls over the past 1.5 years, usually while walking and feels that his legs give out, but yesterday'sfall was different and that it was preceded by nausea and lightheadedness and he has also been feeling generally weak.General weakness, malaise, and nausea began yesterday, he thought he would feel better if he ate something, but when he began to walk to the kitchen, developed acute lightheadedness and fell, striking his right face and head. He denies any loss of consciousness. Denies any focal numbness or weakness. Had not been coughing until he presented to the hospital and denies any shortness of breath. Reports frequent left ankle swelling but not currently. Denies chest pain or palpitations.  [facility]Upon arrival to the ED, patient is found to be afebrile, saturating mid 90s on room air, and with stable blood pressure. EKG features sinus rhythm with nonspecific IVCD. Chest x-ray is negative for acute cardiopulmonary disease. Head CT negative for acute intracranial abnormality. No acute displaced fracture, maxillofacial CT and no acute findings on CT cervical spine. Chemistry panel notable for potassium 3.1, AST 72, ALT 55, and creatinine 4.81, up from 2.76 in December. CBC is unremarkable. COVID-19 PCR is positive. CRP elevated to 5.2. Plan was to discharge the patient home from the emergency department but he felt dizzy and off balance while ambulating and was transferred to [facility] for ongoing evaluation and management.   Subjective: A/O x4,     Assessment & Plan: Covid vaccination; vaccinated 3/3   Principal Problem:   Dizziness Active Problems:   Chronic combined systolic and diastolic CHF (congestive heart failure) (HCC)   VT (ventricular tachycardia) (HCC)   Anxiety state   Coronary artery disease involving coronary bypass graft without angina pectoris   CKD (chronic kidney disease), stage IV (HCC)   Chronic thoracic back pain (1ry area of Pain) (Right)   Hypothyroid   ICD (implantable cardioverter-defibrillator) in place   Hypokalemia   COVID-19 virus infection   Fall   Frequent falls Orthostatic Hypotension -Multifactorial, dehydration from diuretics, CHF (patient does not monitor daily weight), acute on CKD stage IV, COVID infection -Treat underlying causes ,-Presents after a fall at home, reports frequent falls for the past1.5years (after extended exertion on trails),but yesterday's event was different in that he had preceding nausea and lightheadedness and has been feeling generally weak(and occurred at home) -> suspect this episode 2/2 orthostatic hypotension in setting of dehydration/hypovolemia - No acute findings on CT head,noacute neurologic deficits noted  -Normal device function and no arrhythmias on device interrogation per most recent cardiology note-> consider discussion with cardiology while in house if persistent symptoms -He is scheduledfor TTE and stress-test on 2/1 and willsee neurology in March  -Echocardiogram; EF improved to 30 to 35% -Check orthostatics positive (dropped to 87 systolic when standing [id number] diuretics.  Orthostatics improved 1/17, ordered and pending 1/18.  Some continued LH today, another 500 cc over 5 hrs, follow symptoms.continue cardiac monitoring, fall precautions, consult PT for eval and tx - Orthostatic hypotension resolved  Fever -Likely 2/2 covid infection, continue to monitor -Last fever 1/16, follow -Resolved  Elevated LFTs -Likely 2/2  covid + Remdesivir given new onset with hospitalization in setting of covid (with concern for dehydration/orthostasis ?shock liver - vitals don't necessarily support this).   -Stop tylenol, hold Amiodarone and Crestor and Allopurinol for now (discussed with pharmacy).   -RUQ [place] without abnormality seen in RUQ -Acute Hepatitis panel - negative -Trend - worsening today - still trending up, continue to monitor - informal discussion today with GI, suspect this is likely related to infection/meds/heart failure/hemodynamics - will continue to monitor for now, but if continuing to worsen, consider formal GI consult  Acute on CKD stage IV(Baseline Cr 2.76) -SCr is 4.81 in ED, up from 2.76 in December 2021 in care everywhere(our most recent lab prior to this admission was 4.8 in August 2021) -Renal US with medical renal disease, no hydro, UA with 100 mg/dl protein -Renally-dose medications, hold diuretics, monitor -Strict in and out -850.39m -[facility] - 1/19 spoke with Dr. RRoney Jaffenephrology who will see patient in the a.m. -1/19 believes still mildly dehydrated.  Normal saline 587mhr  Ischemic cardiomyopathy VT -EF 25-30% in June 2019, has ICD in place and managed with amiodarone and mexilitine -Normal device function and no arrhythmias on recent device interrogation per cardiology note last month -Appearscompensated, hold diuretics initially given increased SCr, monitor weight - repeat echo with EF [id number] 1 diastolic dysfunction, mildly reduce RVSF, mildly elevated PASP -Strict in and out -850.60m53m Daily weight Filed Weights   08/19/20 0455 08/20/20 0500 08/21/20 0500  Weight: 79.8 kg 78.3 kg 77.4 kg    COVID 19 infection - Patient received 3rd vaccination 05/29/20  -No significant respiratory symptoms, CXR clear - follow inflammatory markers - Due to age, heart disease, and kidney disease, he is  high-risk for severe disease and 3-day course of remdesivir was recommended - stopped now due to LFT bump -Elevated dimer,  -LE US [place]egative LE US)[place]-19 Labs  Recent Labs    08/19/20 0302 08/20/20 0405  DDIMER  --  1.09*  FERRITIN 2,036* 2,092*  CRP 12.1* 9.3*    Lab Results  Component Value Date   SARSCOV2NAA POSITIVE (A) 08/17/2020   Hypokalemia - Potassium goal> 4  Chronic pain -No pain complaints on admission, continue home medications (Norco)   Anxiety -Continue home medications (Klonopin, as-needed Vistaril)   Hypothyroidism -Continue Synthroid   Age indeterminate fx maxillary teeth -Follow outpatient  Thrombocytopenia - Likely 2/2 covid infection, continue to monitor for now. 4t score is 2 today. -1/19 if continues to decline will DC heparin     DVT prophylaxis: Heparin Code Status: Full Family Communication:  Status is: Inpatient    Dispo: The patient is from: Home              Anticipated d/c is to: Home              Anticipated d/c date is: 1/22              Patient currently unstable      Consultants:  Nephrology pending   Procedures/Significant Events:  1/16 US [place] RUQ;Status post cholecystectomy. No other abnormality seen  1/17 Renal US;Koreacreased cortical echogenicity of the kidneys, in keeping with medical renal disease. No hydronephrosis - Nonobstructive LEFT nephrolithiasis 1/17 echocardiogram;Left Ventricle: LVEF= 30 to 35%. The left ventricle has moderately decreased function. The left ventricle demonstrates global hypokinesis.  -Grade I diastolic dysfunction (impaired relaxation).  -LV Wall Scoring:  The apical lateral segment, apical septal segment,  apical anterior  segment,  and apical inferior segment are akinetic.  -Venous: The inferior vena cava was not well visualized. The inferior vena  cava is dilated in size with less than 50% respiratory variability,  suggesting right atrial pressure of 15 mmHg.    I have personally reviewed and interpreted all radiology studies and my findings are as above.  VENTILATOR SETTINGS: Room Air 1/19   Cultures   Antimicrobials: Anti-infectives (From admission, onward)   Start     Ordered Stop   08/19/20 1000  remdesivir 100 mg in sodium chloride 0.9 % 100 mL IVPB  Status:  Discontinued       "Followed by" Linked Group Details   08/18/20 0039 08/18/20 0734   08/18/20 1000  remdesivir 100 mg in sodium chloride 0.9 % 100 mL IVPB  Status:  Discontinued       "Followed by" Linked Group Details   08/17/20 2207 08/18/20 0039   08/17/20 2300  remdesivir 200 mg in sodium chloride 0.9% 250 mL IVPB       "Followed by" Linked Group Details   08/17/20 2207 08/18/20 0037       Devices    LINES / TUBES:      Continuous Infusions: . sodium chloride Stopped (08/18/20 0043)     Objective: Vitals:   08/20/20 1701 08/20/20 2118 08/21/20 0500 08/21/20 0538  BP: 103/79 109/71  101/71  Pulse: 89 67  63  Resp: (!) '22 16  15  '$ Temp: 98.5 F (36.9 C) 98.3 F (36.8 C)  98.2 F (36.8 C)  TempSrc: Oral Oral  Oral  SpO2: 95% 96%  97%  Weight:   77.4 kg   Height:        Intake/Output Summary (Last 24 hours) at 08/21/2020 1155 Last data filed at 08/21/2020 [id number] Gross per 24 hour  Intake 1420 ml  Output 2100 ml  Net -680 ml   Filed Weights   08/19/20 0455 08/20/20 0500 08/21/20 0500  Weight: 79.8 kg 78.3 kg 77.4 kg    Examination:  General: A/O x4 No acute respiratory distress Eyes: negative scleral hemorrhage, negative anisocoria, negative icterus ENT: Negative Runny nose, negative gingival bleeding, Neck:  Negative scars, masses, torticollis, lymphadenopathy, JVD Lungs: Clear to auscultation bilaterally without wheezes or crackles Cardiovascular: Regular rate and rhythm without murmur gallop or rub normal S1 and S2 Abdomen: negative abdominal pain, nondistended, positive soft, bowel sounds, no rebound, no ascites, no appreciable  mass Extremities: No significant cyanosis, clubbing, or edema bilateral lower extremities, left lower extremity in brace (chronic) for foot drop Skin: Negative rashes, lesions, ulcers Psychiatric:  Negative depression, negative anxiety, negative fatigue, negative mania  Central nervous system:  Cranial nerves II through XII intact, tongue/uvula midline, all extremities muscle strength 5/5, sensation intact throughout, negative dysarthria, negative expressive aphasia, negative receptive aphasia.  .     Data Reviewed: Care during the described time interval was provided by me .  I have reviewed this patient's available data, including medical history, events of note, physical examination, and all test results as part of my evaluation.  CBC: Recent Labs  Lab 08/16/20 2323 08/18/20 0334 08/19/20 0302 08/20/20 0405  WBC 8.0 6.1 6.8 5.0  NEUTROABS 7.1  --   --   --   HGB 16.8 15.7 15.4 13.4  HCT 51.2 49.1 48.2 41.7  MCV 97.5 98.4 100.6* 99.0  PLT 167 132* 137* 0000000*   Basic Metabolic Panel: Recent Labs  Lab 08/16/20 2323 08/18/20 0334 08/19/20 0302  08/20/20 0405  NA 136 138 140 137  K 3.1* 4.0 3.9 4.0  CL 96* 100 102 104  CO2 '25 25 22 22  '$ GLUCOSE 104* 87 83 77  BUN 37* 39* 43* 47*  CREATININE 4.81* 4.89* 5.09* 5.02*  CALCIUM 9.5 9.2 8.8* 8.7*  MG  --  2.1  --   --   PHOS  --  3.4  --   --    GFR: Estimated Creatinine Clearance: 15.6 mL/min (A) (by C-G formula based on SCr of 5.02 mg/dL (H)). Liver Function Tests: Recent Labs  Lab 08/16/20 2323 08/18/20 0334 08/19/20 0302 08/20/20 0405  AST 72* 685* 757* 901*  ALT 55* 621* 726* 751*  ALKPHOS 129* 119 115 114  BILITOT 0.9 1.1 1.0 1.0  PROT 8.7* 7.1 6.8 5.9*  ALBUMIN 4.4 3.7 3.5 3.0*   No results for input(s): LIPASE, AMYLASE in the last 168 hours. No results for input(s): AMMONIA in the last 168 hours. Coagulation Profile: Recent Labs  Lab 08/20/20 0405  INR 1.1   Cardiac Enzymes: No results for input(s):  CKTOTAL, CKMB, CKMBINDEX, TROPONINI in the last 168 hours. BNP (last 3 results) No results for input(s): PROBNP in the last 8760 hours. HbA1C: No results for input(s): HGBA1C in the last 72 hours. CBG: Recent Labs  Lab 08/17/20 0123  GLUCAP 75   Lipid Profile: No results for input(s): CHOL, HDL, LDLCALC, TRIG, CHOLHDL, LDLDIRECT in the last 72 hours. Thyroid Function Tests: No results for input(s): TSH, T4TOTAL, FREET4, T3FREE, THYROIDAB in the last 72 hours. Anemia Panel: Recent Labs    08/19/20 0302 08/20/20 0405  FERRITIN 2,036* 2,092*   Sepsis Labs: No results for input(s): PROCALCITON, LATICACIDVEN in the last 168 hours.  Recent Results (from the past 240 hour(s))  Resp Panel by RT-PCR (Flu A&B, Covid) Nasopharyngeal Swab     Status: Abnormal   Collection Time: 08/17/20  4:26 AM   Specimen: Nasopharyngeal Swab; Nasopharyngeal(NP) swabs in vial transport medium  Result Value Ref Range Status   SARS Coronavirus 2 by RT PCR POSITIVE (A) NEGATIVE Final    Comment: RESULT CALLED TO, READ BACK BY AND VERIFIED WITH: NEAL,K AT 0516 ON [id number] BY CHERESNOWSKY,T (NOTE) SARS-CoV-2 target nucleic acids are DETECTED.  The SARS-CoV-2 RNA is generally detectable in upper respiratory specimens during the acute phase of infection. Positive results are indicative of the presence of the identified virus, but do not rule out bacterial infection or co-infection with other pathogens not detected by the test. Clinical correlation with patient history and other diagnostic information is necessary to determine patient infection status. The expected result is Negative.  Fact Sheet for Patients: EntrepreneurPulse.com.au  Fact Sheet for Healthcare Providers: IncredibleEmployment.be  This test is not yet approved or cleared by the [place] FDA and  has been authorized for detection and/or diagnosis of SARS-CoV-2 by FDA under an Emergency Use  Authorization (EUA).  This EUA will remain in effect (meaning this te st can be used) for the duration of  the COVID-19 declaration under Section 564(b)(1) of the Act, 21 U.S.C. section 360bbb-3(b)(1), unless the authorization is terminated or revoked sooner.     Influenza A by PCR NEGATIVE NEGATIVE Final   Influenza B by PCR NEGATIVE NEGATIVE Final    Comment: (NOTE) The Xpert Xpress SARS-CoV-2/FLU/RSV plus assay is intended as an aid in the diagnosis of influenza from Nasopharyngeal swab specimens and should not be used as a sole basis for treatment. Nasal washings and aspirates are unacceptable  for Xpert Xpress SARS-CoV-2/FLU/RSV testing.  Fact Sheet for Patients: EntrepreneurPulse.com.au  Fact Sheet for Healthcare Providers: IncredibleEmployment.be  This test is not yet approved or cleared by the [place] FDA and has been authorized for detection and/or diagnosis of SARS-CoV-2 by FDA under an Emergency Use Authorization (EUA). This EUA will remain in effect (meaning this test can be used) for the duration of the COVID-19 declaration under Section 564(b)(1) of the Act, 21 U.S.C. section 360bbb-3(b)(1), unless the authorization is terminated or revoked.  Performed at [facility], [address], [place], [place] [address]          Radiology Studies: ECHOCARDIOGRAM COMPLETE  Result Date: 08/19/2020    ECHOCARDIOGRAM REPORT   Patient Name:   MYRTLE PITCOCK Date of Exam: 08/19/2020 Medical Rec #:  [id number]            Height:       70.0 in Accession #:    [id number]           Weight:       175.9 lb Date of Birth:  1955/11/22            BSA:          1.977 m Patient Age:    37 years             BP:           117/81 mmHg Patient Gender: M                    HR:           66 bpm. Exam Location:  Inpatient Procedure: 2D Echo, Color Doppler, Limited Color Doppler and Intracardiac            Opacification Agent Indications:     I50.40* Unspecified combined systolic (congestive) and diastolic                 (congestive) heart failure  History:        Patient has prior history of Echocardiogram examinations, most                 recent 01/12/2018. CHF and Cardiomyopathy, CAD and Previous                 Myocardial Infarction, Defibrillator, Abnormal ECG and Prior                 CABG, Arrythmias:VT, Signs/Symptoms:Syncope; Risk                 Factors:Hypertension and Dyslipidemia.  Sonographer:    Roseanna Rainbow RDCS Referring Phys: [phone] A CALDWELL [place]  1. Left ventricular ejection fraction, by estimation, is 30 to 35%. The left ventricle has moderately decreased function. The left ventricle demonstrates global hypokinesis. Left ventricular diastolic parameters are consistent with Grade I diastolic dysfunction (impaired relaxation).  2. Right ventricular systolic function is mildly reduced. The right ventricular size is normal. There is mildly elevated pulmonary artery systolic pressure. The estimated right ventricular systolic pressure is Q000111Q mmHg.  3. The mitral valve is grossly normal. Trivial mitral valve regurgitation. No evidence of mitral stenosis.  4. The aortic valve is grossly normal. Aortic valve regurgitation is mild. No aortic stenosis is present.  5. Aortic dilatation noted. There is borderline dilatation of the ascending aorta, measuring 38 mm.  6. The inferior vena cava is dilated in size with <50% respiratory variability, suggesting right atrial pressure of 15 mmHg.  Conclusion(s)/Recommendation(s): No left ventricular mural or apical thrombus/thrombi. FINDINGS  Left Ventricle: Left ventricular ejection fraction, by estimation, is 30 to 35%. The left ventricle has moderately decreased function. The left ventricle demonstrates global hypokinesis. Definity contrast agent was given IV to delineate the left ventricular endocardial borders. The left ventricular internal cavity size was normal in size. There is no  left ventricular hypertrophy. Left ventricular diastolic parameters are consistent with Grade I diastolic dysfunction (impaired relaxation).  LV Wall Scoring: The apical lateral segment, apical septal segment, apical anterior segment, and apical inferior segment are akinetic. Right Ventricle: The right ventricular size is normal. No increase in right ventricular wall thickness. Right ventricular systolic function is mildly reduced. There is mildly elevated pulmonary artery systolic pressure. The tricuspid regurgitant velocity  is 2.60 m/s, and with an assumed right atrial pressure of 15 mmHg, the estimated right ventricular systolic pressure is Q000111Q mmHg. Left Atrium: Left atrial size was normal in size. Right Atrium: Right atrial size was normal in size. Pericardium: There is no evidence of pericardial effusion. Presence of pericardial fat pad. Mitral Valve: The mitral valve is grossly normal. Mild mitral annular calcification. Trivial mitral valve regurgitation. No evidence of mitral valve stenosis. Tricuspid Valve: The tricuspid valve is normal in structure. Tricuspid valve regurgitation is mild . No evidence of tricuspid stenosis. Aortic Valve: The aortic valve is grossly normal. Aortic valve regurgitation is mild. No aortic stenosis is present. Pulmonic Valve: The pulmonic valve was grossly normal. Pulmonic valve regurgitation is trivial. No evidence of pulmonic stenosis. Aorta: Aortic dilatation noted. There is borderline dilatation of the ascending aorta, measuring 38 mm. Venous: The inferior vena cava was not well visualized. The inferior vena cava is dilated in size with less than 50% respiratory variability, suggesting right atrial pressure of 15 mmHg. IAS/Shunts: The interatrial septum was not well visualized. Additional Comments: A pacer wire is visualized in the right atrium and right ventricle.  LEFT VENTRICLE PLAX 2D LVIDd:         5.40 cm      Diastology LVIDs:         4.10 cm      LV e' medial:     3.57 cm/s LV PW:         0.90 cm      LV E/e' medial:  11.7 LV IVS:        0.90 cm      LV e' lateral:   10.60 cm/s LVOT diam:     2.10 cm      LV E/e' lateral: 3.9 LV SV:         46 LV SV Index:   23 LVOT Area:     3.46 cm  LV Volumes (MOD) LV vol d, MOD A2C: 176.0 ml LV vol d, MOD A4C: 147.3 ml LV vol s, MOD A2C: 111.0 ml LV vol s, MOD A4C: 84.9 ml LV SV MOD A2C:     65.0 ml LV SV MOD A4C:     147.3 ml LV SV MOD BP:      66.7 ml RIGHT VENTRICLE            IVC RV S prime:     7.62 cm/s  IVC diam: 2.70 cm TAPSE (M-mode): 1.5 cm LEFT ATRIUM             Index       RIGHT ATRIUM           Index LA diam:  3.60 cm 1.82 cm/m  RA Area:     13.90 cm LA Vol (A2C):   42.7 ml 21.60 ml/m RA Volume:   33.00 ml  16.70 ml/m LA Vol (A4C):   48.9 ml 24.74 ml/m LA Biplane Vol: 50.2 ml 25.40 ml/m  AORTIC VALVE LVOT Vmax:   78.30 cm/s LVOT Vmean:  47.300 cm/s LVOT VTI:    0.133 m  AORTA Ao Root diam: 3.60 cm Ao Asc diam:  3.80 cm MITRAL VALVE               TRICUSPID VALVE MV Area (PHT): 3.27 cm    TR Peak grad:   27.0 mmHg MV Decel Time: 232 msec    TR Vmax:        260.00 cm/s MV E velocity: 41.70 cm/s MV A velocity: 76.20 cm/s  SHUNTS MV E/A ratio:  0.55        Systemic VTI:  0.13 m                            Systemic Diam: 2.10 cm Cherlynn Kaiser MD Electronically signed by Cherlynn Kaiser MD Signature Date/Time: 08/19/2020/7:37:21 PM    Final         Scheduled Meds: . aspirin EC  81 mg Oral Daily  . clonazePAM  1 mg Oral Q1500  . clonazePAM  1.5 mg Oral QHS  . heparin  5,000 Units Subcutaneous Q8H  . levothyroxine  88 mcg Oral Daily  . mexiletine  250 mg Oral TID  . oxyCODONE  5 mg Oral TID   Continuous Infusions: . sodium chloride Stopped (08/18/20 0043)     LOS: 3 days    Time spent:40 min    WOODS, Geraldo Docker, MD [facility] Pager [phone]  If 7PM-7AM, please contact night-coverage [email or web address] Password [facility] 08/21/2020, 11:55 AM
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
## 2020-08-21 NOTE — Care Management Important Message (Signed)
Important Message  Patient Details IM Letter placed on Patient door Caddy. Name: Finneas Stengel MRN: [id number] Date of Birth: 1955-08-06   Medicare Important Message Given:  Yes     Kerin Salen 08/21/2020, 11:39 AM

## 2020-08-21 NOTE — Progress Notes (Signed)
Poor safety awareness  Patient attempted independent bsc to bed transfer,  Patient lost balance and fell face down on bed.  Patient required help with repositioning.  Reviewed safety precautions, verbalized good understanding. High fall risk, bed alarm activated.

## 2020-08-22 LAB — CBC WITH DIFFERENTIAL/PLATELET
Abs Immature Granulocytes: 0.02 10*3/uL (ref 0.00–0.07)
Basophils Absolute: 0 10*3/uL (ref 0.0–0.1)
Basophils Relative: 0 %
Eosinophils Absolute: 0.2 10*3/uL (ref 0.0–0.5)
Eosinophils Relative: 4 %
HCT: 43.8 % (ref 39.0–52.0)
Hemoglobin: 13.6 g/dL (ref 13.0–17.0)
Immature Granulocytes: 0 %
Lymphocytes Relative: 22 %
Lymphs Abs: 1 10*3/uL (ref 0.7–4.0)
MCH: 31.7 pg (ref 26.0–34.0)
MCHC: 31.1 g/dL (ref 30.0–36.0)
MCV: 102.1 fL — ABNORMAL HIGH (ref 80.0–100.0)
Monocytes Absolute: 0.4 10*3/uL (ref 0.1–1.0)
Monocytes Relative: 9 %
Neutro Abs: 2.9 10*3/uL (ref 1.7–7.7)
Neutrophils Relative %: 65 %
Platelets: 129 10*3/uL — ABNORMAL LOW (ref 150–400)
RBC: 4.29 MIL/uL (ref 4.22–5.81)
RDW: 13.7 % (ref 11.5–15.5)
WBC: 4.5 10*3/uL (ref 4.0–10.5)
nRBC: 0 % (ref 0.0–0.2)

## 2020-08-22 LAB — COMPREHENSIVE METABOLIC PANEL
ALT: 513 U/L — ABNORMAL HIGH (ref 0–44)
AST: 437 U/L — ABNORMAL HIGH (ref 15–41)
Albumin: 2.9 g/dL — ABNORMAL LOW (ref 3.5–5.0)
Alkaline Phosphatase: 153 U/L — ABNORMAL HIGH (ref 38–126)
Anion gap: 10 (ref 5–15)
BUN: 39 mg/dL — ABNORMAL HIGH (ref 8–23)
CO2: 18 mmol/L — ABNORMAL LOW (ref 22–32)
Calcium: 8.8 mg/dL — ABNORMAL LOW (ref 8.9–10.3)
Chloride: 110 mmol/L (ref 98–111)
Creatinine, Ser: 4.33 mg/dL — ABNORMAL HIGH (ref 0.61–1.24)
GFR, Estimated: 15 mL/min — ABNORMAL LOW (ref >60)
Glucose, Bld: 91 mg/dL (ref 70–99)
Potassium: 3.7 mmol/L (ref 3.5–5.1)
Sodium: 138 mmol/L (ref 135–145)
Total Bilirubin: 0.9 mg/dL (ref 0.3–1.2)
Total Protein: 5.8 g/dL — ABNORMAL LOW (ref 6.5–8.1)

## 2020-08-22 LAB — PHOSPHORUS: Phosphorus: 3 mg/dL (ref 2.5–4.6)

## 2020-08-22 LAB — D-DIMER, QUANTITATIVE: D-Dimer, Quant: 0.62 ug/mL-FEU — ABNORMAL HIGH (ref 0.00–0.50)

## 2020-08-22 LAB — MAGNESIUM: Magnesium: 2.2 mg/dL (ref 1.7–2.4)

## 2020-08-22 LAB — FERRITIN: Ferritin: 1061 ng/mL — ABNORMAL HIGH (ref 24–336)

## 2020-08-22 LAB — C-REACTIVE PROTEIN: CRP: 5.1 mg/dL — ABNORMAL HIGH (ref <1.0)

## 2020-08-22 MED ORDER — OXYCODONE HCL 5 MG PO TABS
5.0000 mg | ORAL_TABLET | Freq: Four times a day (QID) | ORAL | Status: DC | PRN
  Administered 2020-08-22: 5 mg via ORAL
  Filled 2020-08-22: qty 1

## 2020-08-22 MED ORDER — MIDODRINE HCL 2.5 MG PO TABS
2.5000 mg | ORAL_TABLET | Freq: Three times a day (TID) | ORAL | Status: DC
  Administered 2020-08-22 – 2020-08-23 (×3): 2.5 mg via ORAL
  Filled 2020-08-22 (×4): qty 1
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
## 2020-08-22 NOTE — Progress Notes (Signed)
PROGRESS NOTE    Richard Cook  [id number] DOB: 1956-05-01 DOA: 08/16/2020 PCP: Birdie Sons, MD     Brief Narrative:  Richard Cook a 65 y.o.WM PMHx CAD, Chronic systolic and diastolic CHF, ventricular tachycardia with ICD, CKD stage IV, hypothyroidism, anxiety, and chronic pain,   Presenting to the emergency department after a fall at home.Patient reports frequent falls over the past 1.5 years, usually while walking and feels that his legs give out, but yesterday'sfall was different and that it was preceded by nausea and lightheadedness and he has also been feeling generally weak.General weakness, malaise, and nausea began yesterday, he thought he would feel better if he ate something, but when he began to walk to the kitchen, developed acute lightheadedness and fell, striking his right face and head. He denies any loss of consciousness. Denies any focal numbness or weakness. Had not been coughing until he presented to the hospital and denies any shortness of breath. Reports frequent left ankle swelling but not currently. Denies chest pain or palpitations.  [facility] arrival to the ED, patient is found to be afebrile, saturating mid 90s on room air, and with stable blood pressure. EKG features sinus rhythm with nonspecific IVCD. Chest x-ray is negative for acute cardiopulmonary disease. Head CT negative for acute intracranial abnormality. No acute displaced fracture, maxillofacial CT and no acute findings on CT cervical spine. Chemistry panel notable for potassium 3.1, AST 72, ALT 55, and creatinine 4.81, up from 2.76 in December. CBC is unremarkable. COVID-19 PCR is positive. CRP elevated to 5.2. Plan was to discharge the patient home from the emergency department but he felt dizzy and off balance while ambulating and was transferred to [facility] for ongoing evaluation and management.   Subjective: 1/20,  afebrile overnight.  No orthostatic hypotension overnight.   Assessment & Plan: Covid vaccination; vaccinated 3/3   Principal Problem:   Dizziness Active Problems:   Chronic combined systolic and diastolic CHF (congestive heart failure) (HCC)   VT (ventricular tachycardia) (HCC)   Anxiety state   Coronary artery disease involving coronary bypass graft without angina pectoris   CKD (chronic kidney disease), stage IV (HCC)   Chronic thoracic back pain (1ry area of Pain) (Right)   Hypothyroid   ICD (implantable cardioverter-defibrillator) in place   Hypokalemia   COVID-19 virus infection   Fall   Frequent falls Orthostatic Hypotension -Multifactorial, dehydration from diuretics, CHF (patient does not monitor daily weight), acute on CKD stage IV, COVID infection -Treat underlying causes ,-Presents after a fall at home, reports frequent falls for the past1.5years (after extended exertion on trails),but yesterday's event was different in that he had preceding nausea and lightheadedness and has been feeling generally weak(and occurred at home) -> suspect this episode 2/2 orthostatic hypotension in setting of dehydration/hypovolemia - No acute findings on CT head,noacute neurologic deficits noted  -Normal device function and no arrhythmias on device interrogation per most recent cardiology note-> consider discussion with cardiology while in house if persistent symptoms -He is scheduledfor TTE and stress-test on 2/1 and willsee neurology in March  -Echocardiogram; EF improved to 30 to 35% -Check orthostatics positive (dropped to 87 systolic when standing [id number] diuretics.  Orthostatics improved 1/17, ordered and pending 1/18.  Some continued LH today, another 500 cc over 5 hrs, follow symptoms.continue cardiac monitoring, fall precautions, consult PT for eval and tx - Orthostatic hypotension resolved -1/20 BP still on the low side start Midodrine 2.5 mg  TID  Bradycardia - 1/20 continue to hold nodal blocking agents  Fever -Likely 2/2 covid infection, continue to monitor -Last fever 1/16, follow -Resolved  Elevated LFTs -Likely 2/2 covid + Remdesivir given new onset with hospitalization in setting of covid (with concern for dehydration/orthostasis ?shock liver - vitals don't necessarily support this).   -Stop tylenol, hold Amiodarone and Crestor and Allopurinol for now (discussed with pharmacy).   -RUQ [place] without abnormality seen in RUQ -Acute Hepatitis panel - negative -Trend - worsening today - still trending up, continue to monitor - informal discussion today with GI, suspect this is likely related to infection/meds/heart failure/hemodynamics - will continue to monitor for now, but if continuing to worsen, consider formal GI consult  Acute on CKD stage IV(Baseline Cr 2.76) Lab Results  Component Value Date   CREATININE 4.33 (H) 08/22/2020   CREATININE 5.02 (H) 08/20/2020   CREATININE 5.09 (H) 08/19/2020   CREATININE 4.89 (H) 08/18/2020   CREATININE 4.81 (H) 08/16/2020  -SCr is 4.81 in ED, up from 2.76 in December 2021 in care everywhere(our most recent lab prior to this admission was 4.8 in August 2021) -Renal US with medical renal disease, no hydro, UA with 100 mg/dl protein -Renally-dose medications, hold diuretics, monitor -Strict in and out -850.57m -[facility] 1/19 spoke with Dr. RRoney Jaffenephrology who will see patient in the a.m. -1/19 believes still mildly dehydrated.  Normal saline 556mhr -1/20 improving with mild hydration  Ischemic cardiomyopathy VT -EF 25-30% in June 2019, has ICD in place and managed with amiodarone and mexilitine -Normal device function and no arrhythmias on recent device interrogation per cardiology note last month -Appearscompensated, hold diuretics initially given increased SCr, monitor weight - repeat echo  with EF [id number] 1 diastolic dysfunction, mildly reduce RVSF, mildly elevated PASP -Strict in and out -850.36m33m Daily weight Filed Weights   08/20/20 0500 08/21/20 0500 08/22/20 0500  Weight: 78.3 kg 77.4 kg 79.5 kg    COVID 19 infection - Patient received 3rd vaccination 05/29/20  -No significant respiratory symptoms, CXR clear - Due to age, heart disease, and kidney disease, he is high-risk for severe disease and 3-day course of remdesivir was recommended - stopped now due to LFT bump COVID-19 Labs  Recent Labs    08/20/20 0405 08/22/20 0422  DDIMER 1.09* 0.62*  FERRITIN 2,092* 1,061*  CRP 9.3* 5.1*    Lab Results  Component Value Date   SARSCOV2NAA POSITIVE (A) 08/17/2020   Elevated dimer,  -LE US [place]egative LE US)[place]oreaHypokalemia - Potassium goal> 4  Chronic pain syndrome -1/20patient unable to take Norco secondary to Tylenol component given his elevated liver enzymes. - 1/20 Oxy IR 5 mgTID -1/20 Oxy IR 5 mg QID PRN  Anxiety -Continue home medications (Klonopin, as-needed Vistaril)   Hypothyroidism -Continue Synthroid   Age indeterminate fx maxillary teeth -Follow outpatient  Thrombocytopenia - Likely 2/2 covid infection, continue to monitor for now. 4t score is 2 today. -1/19 if continues to decline will DC heparin     DVT prophylaxis: Heparin Code Status: Full Family Communication:  Status is: Inpatient    Dispo: The patient is from: Home              Anticipated d/c is to: Home              Anticipated d/c date is: 1/22              Patient currently unstable  Consultants:  Nephrology pending   Procedures/Significant Events:  1/16 US abdomen RUQ;Status post cholecystectomy. No other abnormality seen  1/17 Renal US;Increased cortical echogenicity of the kidneys, in keeping with medical renal disease. No hydronephrosis - Nonobstructive LEFT nephrolithiasis 1/17 echocardiogram;Left Ventricle: LVEF= 30 to 35%. The  left ventricle has moderately decreased function. The left ventricle demonstrates global hypokinesis.  -Grade I diastolic dysfunction (impaired relaxation).  -LV Wall Scoring:  The apical lateral segment, apical septal segment, apical anterior  segment,  and apical inferior segment are akinetic.  -Venous: The inferior vena cava was not well visualized. The inferior vena  cava is dilated in size with less than 50% respiratory variability,  suggesting right atrial pressure of 15 mmHg.   I have personally reviewed and interpreted all radiology studies and my findings are as above.  VENTILATOR SETTINGS: Room Air 1/20   Cultures   Antimicrobials: Anti-infectives (From admission, onward)   Start     Ordered Stop   08/19/20 1000  remdesivir 100 mg in sodium chloride 0.9 % 100 mL IVPB  Status:  Discontinued       "Followed by" Linked Group Details   08/18/20 0039 08/18/20 0734   08/18/20 1000  remdesivir 100 mg in sodium chloride 0.9 % 100 mL IVPB  Status:  Discontinued       "Followed by" Linked Group Details   08/17/20 2207 08/18/20 0039   08/17/20 2300  remdesivir 200 mg in sodium chloride 0.9% 250 mL IVPB       "Followed by" Linked Group Details   08/17/20 2207 08/18/20 0037       Devices    LINES / TUBES:      Continuous Infusions: . sodium chloride Stopped (08/18/20 0043)  . sodium chloride 50 mL/hr at 08/21/20 1827     Objective: Vitals:   08/21/20 2101 08/21/20 2104 08/22/20 0454 08/22/20 0500  BP: 98/68 109/75 107/65   Pulse: 70 70 (!) 58   Resp:   16   Temp:   98.8 F (37.1 C)   TempSrc:   Oral   SpO2: 99% 100% 96%   Weight:    79.5 kg  Height:        Intake/Output Summary (Last 24 hours) at 08/22/2020 [id number] Last data filed at 08/22/2020 0600 Gross per 24 hour  Intake 712.63 ml  Output 900 ml  Net -187.37 ml   Filed Weights   08/20/20 0500 08/21/20 0500 08/22/20 0500  Weight: 78.3 kg 77.4 kg 79.5 kg    Examination:  General: A/O x4 No acute  respiratory distress Eyes: negative scleral hemorrhage, negative anisocoria, negative icterus ENT: Negative Runny nose, negative gingival bleeding, Neck:  Negative scars, masses, torticollis, lymphadenopathy, JVD Lungs: Clear to auscultation bilaterally without wheezes or crackles Cardiovascular: Regular rate and rhythm without murmur gallop or rub normal S1 and S2 Abdomen: negative abdominal pain, nondistended, positive soft, bowel sounds, no rebound, no ascites, no appreciable mass Extremities: No significant cyanosis, clubbing, or edema bilateral lower extremities, left lower extremity in brace (chronic) for foot drop Skin: Negative rashes, lesions, ulcers Psychiatric:  Negative depression, negative anxiety, negative fatigue, negative mania  Central nervous system:  Cranial nerves II through XII intact, tongue/uvula midline, all extremities muscle strength 5/5, sensation intact throughout, negative dysarthria, negative expressive aphasia, negative receptive aphasia.  .     Data Reviewed: Care during the described time interval was provided by me .  I have reviewed this patient's available data, including medical history, events of note,  physical examination, and all test results as part of my evaluation.  CBC: Recent Labs  Lab 08/16/20 2323 08/18/20 0334 08/19/20 0302 08/20/20 0405 08/22/20 0422  WBC 8.0 6.1 6.8 5.0 4.5  NEUTROABS 7.1  --   --   --  2.9  HGB 16.8 15.7 15.4 13.4 13.6  HCT 51.2 49.1 48.2 41.7 43.8  MCV 97.5 98.4 100.6* 99.0 102.1*  PLT 167 132* 137* 109* Q000111Q*   Basic Metabolic Panel: Recent Labs  Lab 08/16/20 2323 08/18/20 0334 08/19/20 0302 08/20/20 0405 08/22/20 0422  NA 136 138 140 137 138  K 3.1* 4.0 3.9 4.0 3.7  CL 96* 100 102 104 110  CO2 '25 25 22 22 '$ 18*  GLUCOSE 104* 87 83 77 91  BUN 37* 39* 43* 47* 39*  CREATININE 4.81* 4.89* 5.09* 5.02* 4.33*  CALCIUM 9.5 9.2 8.8* 8.7* 8.8*  MG  --  2.1  --   --  2.2  PHOS  --  3.4  --   --  3.0    GFR: Estimated Creatinine Clearance: 18 mL/min (A) (by C-G formula based on SCr of 4.33 mg/dL (H)). Liver Function Tests: Recent Labs  Lab 08/16/20 2323 08/18/20 0334 08/19/20 0302 08/20/20 0405 08/22/20 0422  AST 72* 685* 757* 901* 437*  ALT 55* 621* 726* 751* 513*  ALKPHOS 129* 119 115 114 153*  BILITOT 0.9 1.1 1.0 1.0 0.9  PROT 8.7* 7.1 6.8 5.9* 5.8*  ALBUMIN 4.4 3.7 3.5 3.0* 2.9*   No results for input(s): LIPASE, AMYLASE in the last 168 hours. No results for input(s): AMMONIA in the last 168 hours. Coagulation Profile: Recent Labs  Lab 08/20/20 0405  INR 1.1   Cardiac Enzymes: No results for input(s): CKTOTAL, CKMB, CKMBINDEX, TROPONINI in the last 168 hours. BNP (last 3 results) No results for input(s): PROBNP in the last 8760 hours. HbA1C: No results for input(s): HGBA1C in the last 72 hours. CBG: Recent Labs  Lab 08/17/20 0123  GLUCAP 75   Lipid Profile: No results for input(s): CHOL, HDL, LDLCALC, TRIG, CHOLHDL, LDLDIRECT in the last 72 hours. Thyroid Function Tests: No results for input(s): TSH, T4TOTAL, FREET4, T3FREE, THYROIDAB in the last 72 hours. Anemia Panel: Recent Labs    08/20/20 0405 08/22/20 0422  FERRITIN 2,092* 1,061*   Sepsis Labs: No results for input(s): PROCALCITON, LATICACIDVEN in the last 168 hours.  Recent Results (from the past 240 hour(s))  Resp Panel by RT-PCR (Flu A&B, Covid) Nasopharyngeal Swab     Status: Abnormal   Collection Time: 08/17/20  4:26 AM   Specimen: Nasopharyngeal Swab; Nasopharyngeal(NP) swabs in vial transport medium  Result Value Ref Range Status   SARS Coronavirus 2 by RT PCR POSITIVE (A) NEGATIVE Final    Comment: RESULT CALLED TO, READ BACK BY AND VERIFIED WITH: NEAL,K AT 0516 ON [id number] BY CHERESNOWSKY,T (NOTE) SARS-CoV-2 target nucleic acids are DETECTED.  The SARS-CoV-2 RNA is generally detectable in upper respiratory specimens during the acute phase of infection. Positive results  are indicative of the presence of the identified virus, but do not rule out bacterial infection or co-infection with other pathogens not detected by the test. Clinical correlation with patient history and other diagnostic information is necessary to determine patient infection status. The expected result is Negative.  Fact Sheet for Patients: EntrepreneurPulse.com.au  Fact Sheet for Healthcare Providers: IncredibleEmployment.be  This test is not yet approved or cleared by the [place] FDA and  has been authorized for detection and/or diagnosis of SARS-CoV-2  by FDA under an Emergency Use Authorization (EUA).  This EUA will remain in effect (meaning this te st can be used) for the duration of  the COVID-19 declaration under Section 564(b)(1) of the Act, 21 U.S.C. section 360bbb-3(b)(1), unless the authorization is terminated or revoked sooner.     Influenza A by PCR NEGATIVE NEGATIVE Final   Influenza B by PCR NEGATIVE NEGATIVE Final    Comment: (NOTE) The Xpert Xpress SARS-CoV-2/FLU/RSV plus assay is intended as an aid in the diagnosis of influenza from Nasopharyngeal swab specimens and should not be used as a sole basis for treatment. Nasal washings and aspirates are unacceptable for Xpert Xpress SARS-CoV-2/FLU/RSV testing.  Fact Sheet for Patients: EntrepreneurPulse.com.au  Fact Sheet for Healthcare Providers: IncredibleEmployment.be  This test is not yet approved or cleared by the [place] FDA and has been authorized for detection and/or diagnosis of SARS-CoV-2 by FDA under an Emergency Use Authorization (EUA). This EUA will remain in effect (meaning this test can be used) for the duration of the COVID-19 declaration under Section 564(b)(1) of the Act, 21 U.S.C. section 360bbb-3(b)(1), unless the authorization is terminated or revoked.  Performed at [facility], [address], [place], [place] [address]          Radiology Studies: No results found.      Scheduled Meds: . aspirin EC  81 mg Oral Daily  . clonazePAM  1 mg Oral Q1500  . clonazePAM  1.5 mg Oral QHS  . heparin  5,000 Units Subcutaneous Q8H  . levothyroxine  88 mcg Oral Daily  . mexiletine  250 mg Oral TID  . oxyCODONE  5 mg Oral TID   Continuous Infusions: . sodium chloride Stopped (08/18/20 0043)  . sodium chloride 50 mL/hr at 08/21/20 1827     LOS: 4 days    Time spent:40 min    Nimesh Riolo, Geraldo Docker, MD [facility] Pager [phone]  If 7PM-7AM, please contact night-coverage [email or web address] Password [facility] 08/22/2020, 9:36 AM

## 2020-08-23 LAB — CBC WITH DIFFERENTIAL/PLATELET
Abs Immature Granulocytes: 0.02 10*3/uL (ref 0.00–0.07)
Basophils Absolute: 0 10*3/uL (ref 0.0–0.1)
Basophils Relative: 0 %
Eosinophils Absolute: 0.1 10*3/uL (ref 0.0–0.5)
Eosinophils Relative: 3 %
HCT: 44.7 % (ref 39.0–52.0)
Hemoglobin: 14.3 g/dL (ref 13.0–17.0)
Immature Granulocytes: 1 %
Lymphocytes Relative: 23 %
Lymphs Abs: 1 10*3/uL (ref 0.7–4.0)
MCH: 32.4 pg (ref 26.0–34.0)
MCHC: 32 g/dL (ref 30.0–36.0)
MCV: 101.4 fL — ABNORMAL HIGH (ref 80.0–100.0)
Monocytes Absolute: 0.4 10*3/uL (ref 0.1–1.0)
Monocytes Relative: 10 %
Neutro Abs: 2.6 10*3/uL (ref 1.7–7.7)
Neutrophils Relative %: 63 %
Platelets: 107 10*3/uL — ABNORMAL LOW (ref 150–400)
RBC: 4.41 MIL/uL (ref 4.22–5.81)
RDW: 13.7 % (ref 11.5–15.5)
WBC: 4.2 10*3/uL (ref 4.0–10.5)
nRBC: 0 % (ref 0.0–0.2)

## 2020-08-23 LAB — COMPREHENSIVE METABOLIC PANEL
ALT: 391 U/L — ABNORMAL HIGH (ref 0–44)
AST: 251 U/L — ABNORMAL HIGH (ref 15–41)
Albumin: 3 g/dL — ABNORMAL LOW (ref 3.5–5.0)
Alkaline Phosphatase: 169 U/L — ABNORMAL HIGH (ref 38–126)
Anion gap: 10 (ref 5–15)
BUN: 29 mg/dL — ABNORMAL HIGH (ref 8–23)
CO2: 21 mmol/L — ABNORMAL LOW (ref 22–32)
Calcium: 9.2 mg/dL (ref 8.9–10.3)
Chloride: 111 mmol/L (ref 98–111)
Creatinine, Ser: 3.74 mg/dL — ABNORMAL HIGH (ref 0.61–1.24)
GFR, Estimated: 17 mL/min — ABNORMAL LOW (ref >60)
Glucose, Bld: 74 mg/dL (ref 70–99)
Potassium: 4.5 mmol/L (ref 3.5–5.1)
Sodium: 142 mmol/L (ref 135–145)
Total Bilirubin: 0.7 mg/dL (ref 0.3–1.2)
Total Protein: 6 g/dL — ABNORMAL LOW (ref 6.5–8.1)

## 2020-08-23 LAB — C-REACTIVE PROTEIN: CRP: 3.3 mg/dL — ABNORMAL HIGH (ref <1.0)

## 2020-08-23 LAB — D-DIMER, QUANTITATIVE: D-Dimer, Quant: 0.56 ug/mL-FEU — ABNORMAL HIGH (ref 0.00–0.50)

## 2020-08-23 LAB — PHOSPHORUS: Phosphorus: 2.9 mg/dL (ref 2.5–4.6)

## 2020-08-23 LAB — MAGNESIUM: Magnesium: 2 mg/dL (ref 1.7–2.4)

## 2020-08-23 LAB — FERRITIN: Ferritin: 613 ng/mL — ABNORMAL HIGH (ref 24–336)

## 2020-08-23 MED ORDER — MIDODRINE HCL 5 MG PO TABS
5.0000 mg | ORAL_TABLET | Freq: Three times a day (TID) | ORAL | Status: DC
  Administered 2020-08-23 – 2020-08-24 (×4): 5 mg via ORAL
  Filled 2020-08-23 (×5): qty 1

## 2020-08-23 NOTE — Progress Notes (Signed)
[facility] Progress Note  Name: Richard Cook MRN: [id number] DOB: [birth year]  Subjective:  Feels good.  had 1.5 liters UOP Over 1/20.  States he was told he would go home tomorrow.  Has appt with Dr. Juleen China next month.   Has been on 70 ml/hr and tolerating  Review of systems:  Denies shortness of breath or chest pain  Denies n/v ----  Background on consult:  HPI: The patient is a 65 y.o. year-old w/ hx of CAD, chron diast/ syst CHF, hx VT sp ICD, CKD IV and chronic pain  /anxiety presented w/ frequent falls, generalized weakness and nausea. In ED w/u showed creat 4.8, COVID was +. Pt wasn't able to walk on his own (lives alone) so was admitted. Pt felt to be dry and was admitted, IVF"s started.  Diuretics were held. Asked to see for renal failure.  Is f/b Dr Juleen China for his CKD.    Intake/Output Summary (Last 24 hours) at 08/23/2020 1603 Last data filed at 08/23/2020 1500 Gross per 24 hour  Intake 2360.34 ml  Output 1200 ml  Net 1160.34 ml    Vitals:  Vitals:   08/22/20 2000 08/23/20 0437 08/23/20 0844 08/23/20 0847  BP: 115/77 119/75 112/73 93/75  Pulse: (!) 55 61    Resp: 18 19    Temp: 97.9 F (36.6 C) 98.5 F (36.9 C)    TempSrc: Oral     SpO2: 96% 99%    Weight:      Height:         Physical Exam:  General adult male in bed in no acute distress HEENT normocephalic atraumatic extraocular movements intact sclera anicteric Neck supple trachea midline Lungs clear to auscultation bilaterally normal work of breathing at rest  Heart regular rate and rhythm no rubs or gallops appreciated Abdomen soft nontender nondistended Extremities no edema  Psych normal mood and affect Neuro - alert and oriented x 3 provides hx and follows commands  Medications reviewed   Labs:  BMP Latest Ref Rng & Units 08/23/2020 08/22/2020 08/20/2020  Glucose 70 - 99 mg/dL 74 91 77  BUN 8 - 23 mg/dL 29(H) 39(H) 47(H)  Creatinine 0.61 - 1.24 mg/dL 3.74(H) 4.33(H)  5.02(H)  BUN/Creat Ratio 10 - 24 - - -  Sodium 135 - 145 mmol/L 142 138 137  Potassium 3.5 - 5.1 mmol/L 4.5 3.7 4.0  Chloride 98 - 111 mmol/L 111 110 104  CO2 22 - 32 mmol/L 21(L) 18(L) 22  Calcium 8.9 - 10.3 mg/dL 9.2 8.8(L) 8.7(L)     Assessment/Plan:   1. AKI on CKD IV - vs progression to CKD V. B/l creat appears to be 3- 4 range over the last year.  Creat 4.8 1. Creatinine improving with supportive care  2. Would stop IV fluids tomorrow if not discharged 2. COVID + per primary team  3. Falls/ gen'd weakness - was dehydrated, getting IVF.    4. H/o syst / diast CHF - holding diuretics and hydrating cautiously as above  5. VT sp ICD - noted 6. Chronic pain - noted  7. transaminitis - improving   Nephrology will sign off.  Please do not hesitate to contact me with any questions.  He states he has follow-up with Dr. Juleen China next month  Claudia Desanctis, MD 08/23/2020 4:22 PM

## 2020-08-23 NOTE — Progress Notes (Signed)
PROGRESS NOTE    Richard Cook  [id number] DOB: 1956-07-09 DOA: 08/16/2020 PCP: Richard Sons, MD     Brief Narrative:  Richard Cook a 65 y.o.WM PMHx CAD, Chronic systolic and diastolic CHF, ventricular tachycardia with ICD, CKD stage IV, hypothyroidism, anxiety, and chronic pain,   Presenting to the emergency department after a fall at home.Patient reports frequent falls over the past 1.5 years, usually while walking and feels that his legs give out, but yesterday'sfall was different and that it was preceded by nausea and lightheadedness and he has also been feeling generally weak.General weakness, malaise, and nausea began yesterday, he thought he would feel better if he ate something, but when he began to walk to the kitchen, developed acute lightheadedness and fell, striking his right face and head. He denies any loss of consciousness. Denies any focal numbness or weakness. Had not been coughing until he presented to the hospital and denies any shortness of breath. Reports frequent left ankle swelling but not currently. Denies chest pain or palpitations.  [facility]Upon arrival to the ED, patient is found to be afebrile, saturating mid 90s on room air, and with stable blood pressure. EKG features sinus rhythm with nonspecific IVCD. Chest x-ray is negative for acute cardiopulmonary disease. Head CT negative for acute intracranial abnormality. No acute displaced fracture, maxillofacial CT and no acute findings on CT cervical spine. Chemistry panel notable for potassium 3.1, AST 72, ALT 55, and creatinine 4.81, up from 2.76 in December. CBC is unremarkable. COVID-19 PCR is positive. CRP elevated to 5.2. Plan was to discharge the patient home from the emergency department but he felt dizzy and off balance while ambulating and was transferred to [facility] for ongoing evaluation and management.   Subjective: 1/21  afebrile overnight positive orthostatic hypotension this a.m.    Assessment & Plan: Covid vaccination; vaccinated 3/3   Principal Problem:   Dizziness Active Problems:   Chronic combined systolic and diastolic CHF (congestive heart failure) (HCC)   VT (ventricular tachycardia) (HCC)   Anxiety state   Coronary artery disease involving coronary bypass graft without angina pectoris   CKD (chronic kidney disease), stage IV (HCC)   Chronic thoracic back pain (1ry area of Pain) (Right)   Hypothyroid   ICD (implantable cardioverter-defibrillator) in place   Hypokalemia   COVID-19 virus infection   Fall   Frequent falls Orthostatic Hypotension -Multifactorial, dehydration from diuretics, CHF (patient does not monitor daily weight), acute on CKD stage IV, COVID infection -Treat underlying causes ,-Presents after a fall at home, reports frequent falls for the past1.5years (after extended exertion on trails),but yesterday's event was different in that he had preceding nausea and lightheadedness and has been feeling generally weak(and occurred at home) -> suspect this episode 2/2 orthostatic hypotension in setting of dehydration/hypovolemia - No acute findings on CT head,noacute neurologic deficits noted  -Normal device function and no arrhythmias on device interrogation per most recent cardiology note-> consider discussion with cardiology while in house if persistent symptoms -He is scheduledfor TTE and stress-test on 2/1 and willsee neurology in March  -Echocardiogram; EF improved to 30 to 35% -Check orthostatics positive (dropped to 87 systolic when standing [id number] diuretics.  Orthostatics improved 1/17, ordered and pending 1/18.  Some continued LH today, another 500 cc over 5 hrs, follow symptoms.continue cardiac monitoring, fall precautions, consult PT for eval and tx - Orthostatic hypotension resolved -1/21 BP still on the low side increase midodrine 5 mg TID -1/21  AM cortisol pending  Bradycardia - 1/20 continue to hold nodal blocking agents  Fever -Likely 2/2 covid infection, continue to monitor -Last fever 1/16, follow -Resolved  Elevated LFTs -Likely 2/2 covid + Remdesivir given new onset with hospitalization in setting of covid (with concern for dehydration/orthostasis ?shock liver - vitals don't necessarily support this).   -Stop tylenol, hold Amiodarone and Crestor and Allopurinol for now (discussed with pharmacy).   -RUQ [place] without abnormality seen in RUQ -Acute Hepatitis panel - negative -Trend - worsening today - still trending up, continue to monitor - informal discussion today with GI, suspect this is likely related to infection/meds/heart failure/hemodynamics - will continue to monitor for now, but if continuing to worsen, consider formal GI consult  Acute on CKD stage IV(Baseline Cr 2.76) Lab Results  Component Value Date   CREATININE 3.74 (H) 08/23/2020   CREATININE 4.33 (H) 08/22/2020   CREATININE 5.02 (H) 08/20/2020   CREATININE 5.09 (H) 08/19/2020   CREATININE 4.89 (H) 08/18/2020  -SCr is 4.81 in ED, up from 2.76 in December 2021 in care everywhere(our most recent lab prior to this admission was 4.8 in August 2021) -Renal US with medical renal disease, no hydro, UA with 100 mg/dl protein -Renally-dose medications, hold diuretics, monitor -Patient sees Dr Lavonia Dana from [facility] - 1/19 spoke with Dr. Roney Jaffe nephrology who will see patient in the a.m. -1/20 believes still mildly dehydrated.  Nephrology recommended increase normal saline 70 ml/hr -1/21 improving with mild hydration  Ischemic cardiomyopathy VT -EF 25-30% in June 2019, has ICD in place and managed with amiodarone and mexilitine -Normal device function and no arrhythmias on recent device interrogation per cardiology note last month -Appearscompensated, hold diuretics initially given increased SCr, monitor  weight - repeat echo with EF [id number], grade 1 diastolic dysfunction, mildly reduce RVSF, mildly elevated PASP -Strict in and out -363.34m - Daily weight Filed Weights   08/20/20 0500 08/21/20 0500 08/22/20 0500  Weight: 78.3 kg 77.4 kg 79.5 kg    COVID 19 infection - Patient received 3rd vaccination 05/29/20  -No significant respiratory symptoms, CXR clear - Due to age, heart disease, and kidney disease, he is high-risk for severe disease and 3-day course of remdesivir was recommended - stopped now due to LFT bump COVID-19 Labs  Recent Labs    08/22/20 0422 08/23/20 0417  DDIMER 0.62* 0.56*  FERRITIN 1,061* 613*  CRP 5.1* 3.3*    Lab Results  Component Value Date   SARSCOV2NAA POSITIVE (A) 08/17/2020   Elevated dimer,  -LE [place](negative LE [place]  Hypokalemia - Potassium goal> 4  Chronic pain syndrome -1/20patient unable to take Norco secondary to Tylenol component given his elevated liver enzymes. - 1/20 Oxy IR 5 mgTID -1/20 Oxy IR 5 mg QID PRN  Anxiety -Continue home medications (Klonopin, as-needed Vistaril)   Hypothyroidism -Continue Synthroid   Age indeterminate fx maxillary teeth -Follow outpatient  Thrombocytopenia - Likely 2/2 covid infection, continue to monitor for now. 4t score is 2 today. -1/19 if continues to decline will DC heparin -1/21 stable     DVT prophylaxis: Heparin Code Status: Full Family Communication:  Status is: Inpatient    Dispo: The patient is from: Home              Anticipated d/c is to: Home              Anticipated d/c date is: 1/22  Patient currently unstable      Consultants:  Nephrology    Procedures/Significant Events:  1/16 US abdomen RUQ;Status post cholecystectomy. No other abnormality seen  1/17 Renal US;Increased cortical echogenicity of the kidneys, in keeping with medical renal disease. No hydronephrosis - Nonobstructive LEFT nephrolithiasis 1/17 echocardiogram;Left  Ventricle: LVEF= 30 to 35%. The left ventricle has moderately decreased function. The left ventricle demonstrates global hypokinesis.  -Grade I diastolic dysfunction (impaired relaxation).  -LV Wall Scoring:  The apical lateral segment, apical septal segment, apical anterior  segment,  and apical inferior segment are akinetic.  -Venous: The inferior vena cava was not well visualized. The inferior vena  cava is dilated in size with less than 50% respiratory variability,  suggesting right atrial pressure of 15 mmHg.   I have personally reviewed and interpreted all radiology studies and my findings are as above.  VENTILATOR SETTINGS: Room Air 1/21   Cultures   Antimicrobials: Anti-infectives (From admission, onward)   Start     Ordered Stop   08/19/20 1000  remdesivir 100 mg in sodium chloride 0.9 % 100 mL IVPB  Status:  Discontinued       "Followed by" Linked Group Details   08/18/20 0039 08/18/20 0734   08/18/20 1000  remdesivir 100 mg in sodium chloride 0.9 % 100 mL IVPB  Status:  Discontinued       "Followed by" Linked Group Details   08/17/20 2207 08/18/20 0039   08/17/20 2300  remdesivir 200 mg in sodium chloride 0.9% 250 mL IVPB       "Followed by" Linked Group Details   08/17/20 2207 08/18/20 0037       Devices    LINES / TUBES:      Continuous Infusions: . sodium chloride Stopped (08/18/20 0043)  . sodium chloride 70 mL/hr at 08/23/20 0444     Objective: Vitals:   08/22/20 2000 08/23/20 0437 08/23/20 0844 08/23/20 0847  BP: 115/77 119/75 112/73 93/75  Pulse: (!) 55 61    Resp: 18 19    Temp: 97.9 F (36.6 C) 98.5 F (36.9 C)    TempSrc: Oral     SpO2: 96% 99%    Weight:      Height:        Intake/Output Summary (Last 24 hours) at 08/23/2020 [id number] Last data filed at 08/23/2020 0548 Gross per 24 hour  Intake 1660.41 ml  Output 1450 ml  Net 210.41 ml   Filed Weights   08/20/20 0500 08/21/20 0500 08/22/20 0500  Weight: 78.3 kg 77.4 kg 79.5 kg     Examination:  General: A/O x4 No acute respiratory distress Eyes: negative scleral hemorrhage, negative anisocoria, negative icterus ENT: Negative Runny nose, negative gingival bleeding, Neck:  Negative scars, masses, torticollis, lymphadenopathy, JVD Lungs: Clear to auscultation bilaterally without wheezes or crackles Cardiovascular: Regular rate and rhythm without murmur gallop or rub normal S1 and S2 Abdomen: negative abdominal pain, nondistended, positive soft, bowel sounds, no rebound, no ascites, no appreciable mass Extremities: No significant cyanosis, clubbing, or edema bilateral lower extremities, left lower extremity in brace (chronic) for foot drop Skin: Negative rashes, lesions, ulcers Psychiatric:  Negative depression, negative anxiety, negative fatigue, negative mania  Central nervous system:  Cranial nerves II through XII intact, tongue/uvula midline, all extremities muscle strength 5/5, sensation intact throughout, negative dysarthria, negative expressive aphasia, negative receptive aphasia.  .     Data Reviewed: Care during the described time interval was provided by me .  I have reviewed  this patient's available data, including medical history, events of note, physical examination, and all test results as part of my evaluation.  CBC: Recent Labs  Lab 08/16/20 2323 08/18/20 0334 08/19/20 0302 08/20/20 0405 08/22/20 0422 08/23/20 0417  WBC 8.0 6.1 6.8 5.0 4.5 4.2  NEUTROABS 7.1  --   --   --  2.9 2.6  HGB 16.8 15.7 15.4 13.4 13.6 14.3  HCT 51.2 49.1 48.2 41.7 43.8 44.7  MCV 97.5 98.4 100.6* 99.0 102.1* 101.4*  PLT 167 132* 137* 109* 129* [id number]*   Basic Metabolic Panel: Recent Labs  Lab 08/18/20 0334 08/19/20 0302 08/20/20 0405 08/22/20 0422 08/23/20 0417  NA 138 140 137 138 142  K 4.0 3.9 4.0 3.7 4.5  CL 100 102 104 110 111  CO2 '25 22 22 '$ 18* 21*  GLUCOSE 87 83 77 91 74  BUN 39* 43* 47* 39* 29*  CREATININE 4.89* 5.09* 5.02* 4.33* 3.74*  CALCIUM  9.2 8.8* 8.7* 8.8* 9.2  MG 2.1  --   --  2.2 2.0  PHOS 3.4  --   --  3.0 2.9   GFR: Estimated Creatinine Clearance: 20.9 mL/min (A) (by C-G formula based on SCr of 3.74 mg/dL (H)). Liver Function Tests: Recent Labs  Lab 08/18/20 0334 08/19/20 0302 08/20/20 0405 08/22/20 0422 08/23/20 0417  AST 685* 757* 901* 437* 251*  ALT 621* 726* 751* 513* 391*  ALKPHOS 119 115 114 153* 169*  BILITOT 1.1 1.0 1.0 0.9 0.7  PROT 7.1 6.8 5.9* 5.8* 6.0*  ALBUMIN 3.7 3.5 3.0* 2.9* 3.0*   No results for input(s): LIPASE, AMYLASE in the last 168 hours. No results for input(s): AMMONIA in the last 168 hours. Coagulation Profile: Recent Labs  Lab 08/20/20 0405  INR 1.1   Cardiac Enzymes: No results for input(s): CKTOTAL, CKMB, CKMBINDEX, TROPONINI in the last 168 hours. BNP (last 3 results) No results for input(s): PROBNP in the last 8760 hours. HbA1C: No results for input(s): HGBA1C in the last 72 hours. CBG: Recent Labs  Lab 08/17/20 0123  GLUCAP 75   Lipid Profile: No results for input(s): CHOL, HDL, LDLCALC, TRIG, CHOLHDL, LDLDIRECT in the last 72 hours. Thyroid Function Tests: No results for input(s): TSH, T4TOTAL, FREET4, T3FREE, THYROIDAB in the last 72 hours. Anemia Panel: Recent Labs    08/22/20 0422 08/23/20 0417  FERRITIN 1,061* 613*   Sepsis Labs: No results for input(s): PROCALCITON, LATICACIDVEN in the last 168 hours.  Recent Results (from the past 240 hour(s))  Resp Panel by RT-PCR (Flu A&B, Covid) Nasopharyngeal Swab     Status: Abnormal   Collection Time: 08/17/20  4:26 AM   Specimen: Nasopharyngeal Swab; Nasopharyngeal(NP) swabs in vial transport medium  Result Value Ref Range Status   SARS Coronavirus 2 by RT PCR POSITIVE (A) NEGATIVE Final    Comment: RESULT CALLED TO, READ BACK BY AND VERIFIED WITH: NEAL,K AT 0516 ON [id number] BY CHERESNOWSKY,T (NOTE) SARS-CoV-2 target nucleic acids are DETECTED.  The SARS-CoV-2 RNA is generally detectable in upper  respiratory specimens during the acute phase of infection. Positive results are indicative of the presence of the identified virus, but do not rule out bacterial infection or co-infection with other pathogens not detected by the test. Clinical correlation with patient history and other diagnostic information is necessary to determine patient infection status. The expected result is Negative.  Fact Sheet for Patients: EntrepreneurPulse.com.au  Fact Sheet for Healthcare Providers: IncredibleEmployment.be  This test is not yet approved or cleared by the  [place] Architectural technologist and  has been authorized for detection and/or diagnosis of SARS-CoV-2 by FDA under an Print production planner (EUA).  This EUA will remain in effect (meaning this te st can be used) for the duration of  the COVID-19 declaration under Section 564(b)(1) of the Act, 21 U.S.C. section 360bbb-3(b)(1), unless the authorization is terminated or revoked sooner.     Influenza A by PCR NEGATIVE NEGATIVE Final   Influenza B by PCR NEGATIVE NEGATIVE Final    Comment: (NOTE) The Xpert Xpress SARS-CoV-2/FLU/RSV plus assay is intended as an aid in the diagnosis of influenza from Nasopharyngeal swab specimens and should not be used as a sole basis for treatment. Nasal washings and aspirates are unacceptable for Xpert Xpress SARS-CoV-2/FLU/RSV testing.  Fact Sheet for Patients: EntrepreneurPulse.com.au  Fact Sheet for Healthcare Providers: IncredibleEmployment.be  This test is not yet approved or cleared by the [place] FDA and has been authorized for detection and/or diagnosis of SARS-CoV-2 by FDA under an Emergency Use Authorization (EUA). This EUA will remain in effect (meaning this test can be used) for the duration of the COVID-19 declaration under Section 564(b)(1) of the Act, 21 U.S.C. section 360bbb-3(b)(1), unless the authorization is  terminated or revoked.  Performed at [facility], [address], [place], [place] [address]          Radiology Studies: No results found.      Scheduled Meds: . aspirin EC  81 mg Oral Daily  . clonazePAM  1 mg Oral Q1500  . clonazePAM  1.5 mg Oral QHS  . heparin  5,000 Units Subcutaneous Q8H  . levothyroxine  88 mcg Oral Daily  . mexiletine  250 mg Oral TID  . midodrine  2.5 mg Oral TID WC  . oxyCODONE  5 mg Oral TID   Continuous Infusions: . sodium chloride Stopped (08/18/20 0043)  . sodium chloride 70 mL/hr at 08/23/20 0444     LOS: 5 days    Time spent:40 min    Rhylin Venters, Geraldo Docker, MD [facility] Pager [phone]  If 7PM-7AM, please contact night-coverage [email or web address] Password [facility] 08/23/2020, 9:29 AM

## 2020-08-24 LAB — COMPREHENSIVE METABOLIC PANEL
ALT: 308 U/L — ABNORMAL HIGH (ref 0–44)
AST: 169 U/L — ABNORMAL HIGH (ref 15–41)
Albumin: 3.1 g/dL — ABNORMAL LOW (ref 3.5–5.0)
Alkaline Phosphatase: 180 U/L — ABNORMAL HIGH (ref 38–126)
Anion gap: 8 (ref 5–15)
BUN: 26 mg/dL — ABNORMAL HIGH (ref 8–23)
CO2: 20 mmol/L — ABNORMAL LOW (ref 22–32)
Calcium: 9.4 mg/dL (ref 8.9–10.3)
Chloride: 110 mmol/L (ref 98–111)
Creatinine, Ser: 3.24 mg/dL — ABNORMAL HIGH (ref 0.61–1.24)
GFR, Estimated: 21 mL/min — ABNORMAL LOW (ref >60)
Glucose, Bld: 50 mg/dL — ABNORMAL LOW (ref 70–99)
Potassium: 4.5 mmol/L (ref 3.5–5.1)
Sodium: 138 mmol/L (ref 135–145)
Total Bilirubin: 1.1 mg/dL (ref 0.3–1.2)
Total Protein: 6.2 g/dL — ABNORMAL LOW (ref 6.5–8.1)

## 2020-08-24 LAB — GLUCOSE, CAPILLARY
Glucose-Capillary: 45 mg/dL — ABNORMAL LOW (ref 70–99)
Glucose-Capillary: 43 mg/dL — CL (ref 70–99)
Glucose-Capillary: 57 mg/dL — ABNORMAL LOW (ref 70–99)
Glucose-Capillary: 70 mg/dL (ref 70–99)
Glucose-Capillary: 127 mg/dL — ABNORMAL HIGH (ref 70–99)

## 2020-08-24 LAB — CBC WITH DIFFERENTIAL/PLATELET
Abs Immature Granulocytes: 0.02 10*3/uL (ref 0.00–0.07)
Basophils Absolute: 0 10*3/uL (ref 0.0–0.1)
Basophils Relative: 0 %
Eosinophils Absolute: 0.2 10*3/uL (ref 0.0–0.5)
Eosinophils Relative: 4 %
HCT: 46.7 % (ref 39.0–52.0)
Hemoglobin: 14.7 g/dL (ref 13.0–17.0)
Immature Granulocytes: 1 %
Lymphocytes Relative: 21 %
Lymphs Abs: 0.9 10*3/uL (ref 0.7–4.0)
MCH: 32 pg (ref 26.0–34.0)
MCHC: 31.5 g/dL (ref 30.0–36.0)
MCV: 101.5 fL — ABNORMAL HIGH (ref 80.0–100.0)
Monocytes Absolute: 0.4 10*3/uL (ref 0.1–1.0)
Monocytes Relative: 10 %
Neutro Abs: 2.8 10*3/uL (ref 1.7–7.7)
Neutrophils Relative %: 64 %
Platelets: 133 10*3/uL — ABNORMAL LOW (ref 150–400)
RBC: 4.6 MIL/uL (ref 4.22–5.81)
RDW: 13.8 % (ref 11.5–15.5)
WBC: 4.3 10*3/uL (ref 4.0–10.5)
nRBC: 0 % (ref 0.0–0.2)

## 2020-08-24 LAB — FERRITIN: Ferritin: 514 ng/mL — ABNORMAL HIGH (ref 24–336)

## 2020-08-24 LAB — D-DIMER, QUANTITATIVE: D-Dimer, Quant: 0.69 ug/mL-FEU — ABNORMAL HIGH (ref 0.00–0.50)

## 2020-08-24 LAB — MAGNESIUM: Magnesium: 1.9 mg/dL (ref 1.7–2.4)

## 2020-08-24 LAB — C-REACTIVE PROTEIN: CRP: 3.1 mg/dL — ABNORMAL HIGH (ref <1.0)

## 2020-08-24 LAB — PHOSPHORUS: Phosphorus: 2.7 mg/dL (ref 2.5–4.6)

## 2020-08-24 MED ORDER — MIDODRINE HCL 5 MG PO TABS: 5.0000 mg | ORAL_TABLET | Freq: Three times a day (TID) | ORAL | 0 refills | Status: DC

## 2020-08-24 MED ORDER — OXYCODONE HCL 5 MG PO TABS
5.0000 mg | ORAL_TABLET | Freq: Three times a day (TID) | ORAL | 0 refills | Status: AC
Start: 2020-08-24 — End: 2020-08-29

## 2020-08-24 MED ORDER — ASPIRIN 81 MG PO TBEC: 81.0000 mg | DELAYED_RELEASE_TABLET | Freq: Every day | ORAL | 0 refills | Status: DC

## 2020-08-24 MED ORDER — SENNOSIDES-DOCUSATE SODIUM 8.6-50 MG PO TABS: 1.0000 | ORAL_TABLET | Freq: Every evening | ORAL | 0 refills | Status: DC | PRN

## 2020-08-24 MED ORDER — HYDROXYZINE HCL 25 MG PO TABS: 25.0000 mg | ORAL_TABLET | Freq: Every evening | ORAL | 0 refills | Status: DC | PRN

## 2020-08-24 NOTE — Progress Notes (Signed)
Hypoglycemic Event  CBG: 45 and 43 @ 0821 and 0842   Treatment: gave 16 oz of juice and let patient eat some breakfast   Symptoms: None  Follow-up CBG: [id number] CBG Result:57 second  follow up @ [id number] CBG was 70  Possible Reasons for Event: poor oral intake   Comments/MD notified: Dr. Sherral Hammers notified     Richard Cook, Richard Cook [place]

## 2020-08-24 NOTE — TOC Progression Note (Signed)
Transition of Care [facility]) - Progression Note    Patient Details  Name: Richard Cook MRN: [id number] Date of Birth: 01/10/1956  Transition of Care [facility]) CM/SW Contact  Joaquin Courts, RN Phone Number: 08/24/2020, 3:26 PM  Clinical Narrative:    Patient provided with dme rolling walker from [employer] storage closet. [employer] rep Lucrecia made aware.   Expected Discharge Plan: Home/Self Care Barriers to Discharge: No Barriers Identified  Expected Discharge Plan and Services Expected Discharge Plan: Home/Self Care   Discharge Planning Services: CM Consult   Living arrangements for the past 2 months: Hotel/Motel Expected Discharge Date: 08/24/20                                     Social Determinants of Health (SDOH) Interventions    Readmission Risk Interventions No flowsheet data found.

## 2020-08-24 NOTE — Discharge Summary (Signed)
Physician Discharge Summary  Richard Cook [id number] DOB: 1956/07/13 DOA: 08/16/2020  PCP: Birdie Sons, MD  Admit date: 08/16/2020 Discharge date: 08/24/2020  Time spent: 35 minutes  Recommendations for Outpatient Follow-up:   Covid vaccination; vaccinated 3/3  Frequent falls Orthostatic Hypotension -Multifactorial, dehydration from diuretics, CHF (patient does not monitor daily weight), acute on CKD stage IV, COVID infection -Treat underlying causes ,-Presents after a fall at home, reports frequent falls for the past1.5years (after extended exertion on trails),but yesterday's event was different in that he had preceding nausea and lightheadedness and has been feeling generally weak(and occurred at home) ->suspect this episode 2/2 orthostatic hypotension in setting of dehydration/hypovolemia - No acute findings on CT head,noacute neurologic deficits noted  -Normal device function and no arrhythmias on device interrogation per most recent cardiology note -He is scheduledfor TTE and stress-test on 2/1 and willsee neurology in March -Echocardiogram; EF improved to 30 to 35% -Checkorthostatics positive (dropped to 87 systolic when standing [id number] diuretics. Orthostatics improved 1/17, ordered and pending 1/18. - Orthostatic hypotension resolved -Midodrine 5 mg TID -AM cortisol was not drawn in the hospital, would request that for patient's nephrologist obtain A.m. cortisol at follow-up.  Bradycardia - 1/20 continue to hold nodal blocking agents -Cardiologist to decide when/if to restart any nodal blocking agent.  Fever -Likely 2/2 covid infection, continue to monitor -Last fever 1/16, follow -Resolved  Elevated LFTs -Likely 2/2 covid + Remdesivir given new onset with hospitalization in setting of covid (with concern for dehydration/orthostasis ?shock liver  -Stop tylenol, hold Amiodarone and Crestor and Allopurinol for now (discussed with  pharmacy).  -RUQ [place] without abnormality seen in RUQ -Acute Hepatitis panel - negative -Trend improved with hydration although not yet back to normal.   -Schedule follow-up with PCP after 09/07/2020 for lab checks to evaluate LFTs, and renal function.  Acute on CKD stage IV(Baseline Cr 2.76) Lab Results  Component Value Date   CREATININE 3.24 (H) 08/24/2020   CREATININE 3.74 (H) 08/23/2020   CREATININE 4.33 (H) 08/22/2020   CREATININE 5.02 (H) 08/20/2020   CREATININE 5.09 (H) 08/19/2020  SCr is 4.81 in ED, up from 2.76 in December 2021in care everywhere(our most recent lab prior to this admission was 4.8 in August 2021) -Renal US with medical renal disease, no hydro, UA with 100 mg/dl protein -Renally-dose medications, hold diuretics, monitor -Patient sees Dr Lavonia Dana from [facility] - 1/19 spoke with Dr. Roney Jaffe nephrology who will see patient in the a.m. - Creatinine with significant improvement with hydration almost back to baseline.  Patient understands must continue to hydrate.  Ischemic cardiomyopathy VT -EF 25-30% in June 2019, has ICD in place and managed with amiodarone and mexilitine -Normal device function and no arrhythmias on recent device interrogation per cardiology note last month -Appearscompensated, hold diuretics initially given increased SCr, monitor weight - repeat echowith EF [id number], grade 1 diastolic dysfunction, mildly reduce RVSF, mildly elevated PASP -Strict in and out -363.67m - Daily weight Filed Weights   08/21/20 0500 08/22/20 0500 08/24/20 0500  Weight: 77.4 kg 79.5 kg 80.4 kg  -Will be considered patient's base weight.  Has been counseled to weigh himself daily  COVID 19 infection - Patient received 3rd vaccination 05/29/20  -No significant respiratory symptoms, CXR clear - Due to age, heart disease, and kidney disease, he is high-risk for severe disease and 3-day course of remdesivir was  recommended - stopped now due to LFT bump COVID-19 Labs  Recent Labs  08/22/20 0422 08/23/20 0417 08/24/20 0451  DDIMER 0.62* 0.56* 0.69*  FERRITIN 1,061* 613* 514*  CRP 5.1* 3.3* 3.1*    Lab Results  Component Value Date   SARSCOV2NAA POSITIVE (A) 08/17/2020  - Patient will be contagious until 09/07/2020 and will need to take general precautions in order to protect himself and family.  To include wearing a facemask, isolation, frequent washing of hands, etc. see below  Elevated dimer,  -LE [place] (negative LE [place])  Hypokalemia - Potassium goal> 4  Chronic pain syndrome - Would not restart Norco until cleared by his PCP or nephrologist or cardiologist secondary to a containing Tylenol and patient's liver enzymes being still elevated.  Anxiety -Continue home medications (Klonopin, as-needed Vistaril)   Hypothyroidism -Continue Synthroid   Age indeterminate fx maxillary teeth -Follow outpatient  Thrombocytopenia - Likely 2/2 covid infection, continue to monitor for now. 4t score is 2 today. -1/21 stable    Discharge Diagnoses:  Principal Problem:   Dizziness Active Problems:   Chronic combined systolic and diastolic CHF (congestive heart failure) (HCC)   VT (ventricular tachycardia) (HCC)   Anxiety state   Coronary artery disease involving coronary bypass graft without angina pectoris   CKD (chronic kidney disease), stage IV (HCC)   Chronic thoracic back pain (1ry area of Pain) (Right)   Hypothyroid   ICD (implantable cardioverter-defibrillator) in place   Hypokalemia   COVID-19 virus infection   Fall   Discharge Condition: Stable  Diet recommendation: Heart healthy  Filed Weights   08/21/20 0500 08/22/20 0500 08/24/20 0500  Weight: 77.4 kg 79.5 kg 80.4 kg    History of present illness:  Stone Watring a 65 y.o.WM PMHx CAD, Chronic systolic and diastolic CHF, ventricular tachycardia with ICD, CKD stage IV, hypothyroidism, anxiety,  and chronic pain,   Presenting to the emergency department after a fall at home.Patient reports frequent falls over the past 1.5 years, usually while walking and feels that his legs give out, but yesterday'sfall was different and that it was preceded by nausea and lightheadedness and he has also been feeling generally weak.General weakness, malaise, and nausea began yesterday, he thought he would feel better if he ate something, but when he began to walk to the kitchen, developed acute lightheadedness and fell, striking his right face and head. He denies any loss of consciousness. Denies any focal numbness or weakness. Had not been coughing until he presented to the hospital and denies any shortness of breath. Reports frequent left ankle swelling but not currently. Denies chest pain or palpitations.  [facility] arrival to the ED, patient is found to be afebrile, saturating mid 90s on room air, and with stable blood pressure. EKG features sinus rhythm with nonspecific IVCD. Chest x-ray is negative for acute cardiopulmonary disease. Head CT negative for acute intracranial abnormality. No acute displaced fracture, maxillofacial CT and no acute findings on CT cervical spine. Chemistry panel notable for potassium 3.1, AST 72, ALT 55, and creatinine 4.81, up from 2.76 in December. CBC is unremarkable. COVID-19 PCR is positive. CRP elevated to 5.2. Plan was to discharge the patient home from the emergency department but he felt dizzy and off balance while ambulating and was transferred to [facility] for ongoing evaluation and management.  Hospital Course:  See above  Procedures: 1/16 US abdomen RUQ;Status post cholecystectomy. No other abnormality seen  1/17 Renal US;Increased cortical echogenicity of the kidneys, in keeping with medical renal disease. No hydronephrosis - Nonobstructive LEFT nephrolithiasis  1/17 echocardiogram;Left Ventricle: LVEF=  30 to 35%. The left ventricle has moderately decreased function. The left ventricle demonstrates global hypokinesis.  -Grade I diastolic dysfunction (impaired relaxation).  -LV Wall Scoring:  The apical lateral segment, apical septal segment, apical anterior  segment,  and apical inferior segment are akinetic.  -Venous: The inferior vena cava was not well visualized. The inferior vena  cava is dilated in size with less than 50% respiratory variability,  suggesting right atrial pressure of 15 mmHg.   Consultations: Nephrology   Antibiotics Anti-infectives (From admission, onward)   Start     Ordered Stop   08/19/20 1000  remdesivir 100 mg in sodium chloride 0.9 % 100 mL IVPB  Status:  Discontinued       "Followed by" Linked Group Details   08/18/20 0039 08/18/20 0734   08/18/20 1000  remdesivir 100 mg in sodium chloride 0.9 % 100 mL IVPB  Status:  Discontinued       "Followed by" Linked Group Details   08/17/20 2207 08/18/20 0039   08/17/20 2300  remdesivir 200 mg in sodium chloride 0.9% 250 mL IVPB       "Followed by" Linked Group Details   08/17/20 2207 08/18/20 0037       Discharge Exam: Vitals:   08/24/20 1024 08/24/20 1027 08/24/20 1028 08/24/20 1030  BP: 105/67 112/70 99/77 123/78  Pulse: (!) 59 70 79 79  Resp:      Temp:      TempSrc:      SpO2: 95% 100% 100% 100%  Weight:      Height:        General: A/O x4 No acute respiratory distress Eyes: negative scleral hemorrhage, negative anisocoria, negative icterus ENT: Negative Runny nose, negative gingival bleeding, Neck:  Negative scars, masses, torticollis, lymphadenopathy, JVD Lungs: Clear to auscultation bilaterally without wheezes or crackles Cardiovascular: Regular rate and rhythm without murmur gallop or rub normal S1 and S2  Discharge Instructions  Discharge Instructions    Diet - low sodium heart healthy   Complete by: As directed    Discharge instructions   Complete by: As directed    ?   Person  Under Monitoring Name: Monterrius Hafler  Location: [address] [place] [address]   Infection Prevention Recommendations for Individuals Confirmed to have, or Being Evaluated for, 2019 Novel Coronavirus (COVID-19) Infection Who Receive Care at Home  Individuals who are confirmed to have, or are being evaluated for, COVID-19 should follow the prevention steps below until a healthcare provider or local or state health department says they can return to normal activities.  Stay home except to get medical care You should restrict activities outside your home, except for getting medical care. Do not go to work, school, or public areas, and do not use public transportation or taxis.  Call ahead before visiting your doctor Before your medical appointment, call the healthcare provider and tell them that you have, or are being evaluated for, COVID-19 infection. This will help the healthcare provider's office take steps to keep other people from getting infected. Ask your healthcare provider to call the local or state health department.  Monitor your symptoms Seek prompt medical attention if your illness is worsening (e.g., difficulty breathing). Before going to your medical appointment, call the healthcare provider and tell them that you have, or are being evaluated for, COVID-19 infection. Ask your healthcare provider to call the local or state health department.  Wear a facemask You should wear a  facemask that covers your nose and mouth when you are in the same room with other people and when you visit a healthcare provider. People who live with or visit you should also wear a facemask while they are in the same room with you.  Separate yourself from other people in your home As much as possible, you should stay in a different room from other people in your home. Also, you should use a separate bathroom, if available.  Avoid sharing household items You should not share  dishes, drinking glasses, cups, eating utensils, towels, bedding, or other items with other people in your home. After using these items, you should wash them thoroughly with soap and water.  Cover your coughs and sneezes Cover your mouth and nose with a tissue when you cough or sneeze, or you can cough or sneeze into your sleeve. Throw used tissues in a lined trash can, and immediately wash your hands with soap and water for at least 20 seconds or use an alcohol-based hand rub.  Wash your [facility] [employer] your hands often and thoroughly with soap and water for at least 20 seconds. You can use an alcohol-based hand sanitizer if soap and water are not available and if your hands are not visibly dirty. Avoid touching your eyes, nose, and mouth with unwashed hands.   Prevention Steps for Caregivers and Household Members of Individuals Confirmed to have, or Being Evaluated for, COVID-19 Infection Being Cared for in the Home  If you live with, or provide care at home for, a person confirmed to have, or being evaluated for, COVID-19 infection please follow these guidelines to prevent infection:  Follow healthcare provider's instructions Make sure that you understand and can help the patient follow any healthcare provider instructions for all care.  Provide for the patient's basic needs You should help the patient with basic needs in the home and provide support for getting groceries, prescriptions, and other personal needs.  Monitor the patient's symptoms If they are getting sicker, call his or her medical provider and tell them that the patient has, or is being evaluated for, COVID-19 infection. This will help the healthcare provider's office take steps to keep other people from getting infected. Ask the healthcare provider to call the local or state health department.  Limit the number of people who have contact with the patient If possible, have only one caregiver for the patient. Other  household members should stay in another home or place of residence. If this is not possible, they should stay in another room, or be separated from the patient as much as possible. Use a separate bathroom, if available. Restrict visitors who do not have an essential need to be in the home.  Keep older adults, very young children, and other sick people away from the patient Keep older adults, very young children, and those who have compromised immune systems or chronic health conditions away from the patient. This includes people with chronic heart, lung, or kidney conditions, diabetes, and cancer.  Ensure good ventilation Make sure that shared spaces in the home have good air flow, such as from an air conditioner or an opened window, weather permitting.  Wash your hands often Wash your hands often and thoroughly with soap and water for at least 20 seconds. You can use an alcohol based hand sanitizer if soap and water are not available and if your hands are not visibly dirty. Avoid touching your eyes, nose, and mouth with unwashed hands. Use disposable paper  towels to dry your hands. If not available, use dedicated cloth towels and replace them when they become wet.  Wear a facemask and gloves Wear a disposable facemask at all times in the room and gloves when you touch or have contact with the patient's blood, body fluids, and/or secretions or excretions, such as sweat, saliva, sputum, nasal mucus, vomit, urine, or feces.  Ensure the mask fits over your nose and mouth tightly, and do not touch it during use. Throw out disposable facemasks and gloves after using them. Do not reuse. Wash your hands immediately after removing your facemask and gloves. If your personal clothing becomes contaminated, carefully remove clothing and launder. Wash your hands after handling contaminated clothing. Place all used disposable facemasks, gloves, and other waste in a lined container before disposing them with  other household waste. Remove gloves and wash your hands immediately after handling these items.  Do not share dishes, glasses, or other household items with the patient Avoid sharing household items. You should not share dishes, drinking glasses, cups, eating utensils, towels, bedding, or other items with a patient who is confirmed to have, or being evaluated for, COVID-19 infection. After the person uses these items, you should wash them thoroughly with soap and water.  Wash laundry thoroughly Immediately remove and wash clothes or bedding that have blood, body fluids, and/or secretions or excretions, such as sweat, saliva, sputum, nasal mucus, vomit, urine, or feces, on them. Wear gloves when handling laundry from the patient. Read and follow directions on labels of laundry or clothing items and detergent. In general, wash and dry with the warmest temperatures recommended on the label.  Clean all areas the individual has used often Clean all touchable surfaces, such as counters, tabletops, doorknobs, bathroom fixtures, toilets, phones, keyboards, tablets, and bedside tables, every day. Also, clean any surfaces that may have blood, body fluids, and/or secretions or excretions on them. Wear gloves when cleaning surfaces the patient has come in contact with. Use a diluted bleach solution (e.g., dilute bleach with 1 part bleach and 10 parts water) or a household disinfectant with a label that says EPA-registered for coronaviruses. To make a bleach solution at home, add 1 tablespoon of bleach to 1 quart (4 cups) of water. For a larger supply, add  cup of bleach to 1 gallon (16 cups) of water. Read labels of cleaning products and follow recommendations provided on product labels. Labels contain instructions for safe and effective use of the cleaning product including precautions you should take when applying the product, such as wearing gloves or eye protection and making sure you have good ventilation  during use of the product. Remove gloves and wash hands immediately after cleaning.  Monitor yourself for signs and symptoms of illness Caregivers and household members are considered close contacts, should monitor their health, and will be asked to limit movement outside of the home to the extent possible. Follow the monitoring steps for close contacts listed on the symptom monitoring form.   ? If you have additional questions, contact your local health department or call the epidemiologist on call at [phone] (available 24/7). ? This guidance is subject to change. For the most up-to-date guidance from CDC, please refer to their website: YouBlogs.pl   Increase activity slowly   Complete by: As directed      Allergies as of 08/24/2020      Reactions   Codeine Other (See Comments)   Tolerates Hydrocodone (??)   Colchicine Nausea And Vomiting  Medication List    STOP taking these medications   allopurinol 300 MG tablet Commonly known as: ZYLOPRIM   amiodarone 200 MG tablet Commonly known as: PACERONE   bumetanide 2 MG tablet Commonly known as: BUMEX   HYDROcodone-acetaminophen 10-325 MG tablet Commonly known as: NORCO   rosuvastatin 40 MG tablet Commonly known as: CRESTOR     TAKE these medications   aspirin 81 MG EC tablet Take 1 tablet (81 mg total) by mouth daily. Swallow whole. Start taking on: August 25, 2020 What changed: additional instructions   clonazePAM 1 MG tablet Commonly known as: KLONOPIN TAKE ONE TABLET EVERY AFTERNOON AND 1 & 1/2 TABLET AT BEDTIME   FISH OIL PO Take 1 capsule by mouth daily.   hydrocortisone 1 % lotion Apply 1 application topically 2 (two) times daily.   hydrOXYzine 25 MG tablet Commonly known as: ATARAX/VISTARIL Take 1-2 tablets (25-50 mg total) by mouth at bedtime as needed for anxiety. What changed: reasons to take this   levothyroxine 88 MCG  tablet Commonly known as: SYNTHROID Take 1 tablet (88 mcg total) by mouth daily.   mexiletine 250 MG capsule Commonly known as: MEXITIL Take 1 capsule (250 mg total) by mouth 3 (three) times daily.   midodrine 5 MG tablet Commonly known as: PROAMATINE Take 1 tablet (5 mg total) by mouth 3 (three) times daily with meals.   mupirocin ointment 2 % Commonly known as: Bactroban Apply 1 application topically 2 (two) times daily. Right nare apply small amount.   oxyCODONE 5 MG immediate release tablet Commonly known as: Oxy IR/ROXICODONE Take 1 tablet (5 mg total) by mouth 3 (three) times daily for 5 days.   senna-docusate 8.6-50 MG tablet Commonly known as: Senokot-S Take 1 tablet by mouth at bedtime as needed for mild constipation.      Allergies  Allergen Reactions  . Codeine Other (See Comments)    Tolerates Hydrocodone (??)  . Colchicine Nausea And Vomiting      The results of significant diagnostics from this hospitalization (including imaging, microbiology, ancillary and laboratory) are listed below for reference.    Significant Diagnostic Studies: CT Head Wo Contrast  Result Date: 08/16/2020 CLINICAL DATA:  Status post fall, hitting head on floor. Has had several recent falls . EXAM: CT HEAD WITHOUT CONTRAST CT MAXILLOFACIAL WITHOUT CONTRAST CT CERVICAL SPINE WITHOUT CONTRAST TECHNIQUE: Multidetector CT imaging of the head, cervical spine, and maxillofacial structures were performed using the standard protocol without intravenous contrast. Multiplanar CT image reconstructions of the cervical spine and maxillofacial structures were also generated. COMPARISON:  CT head and cervical spine 04/30/2017 FINDINGS: CT HEAD FINDINGS Brain: Patchy and confluent areas of decreased attenuation are noted throughout the deep and periventricular white matter of the cerebral hemispheres bilaterally, compatible with chronic microvascular ischemic disease. No evidence of large-territorial acute  infarction. No parenchymal hemorrhage. No mass lesion. No extra-axial collection. Similar-appearing retro cerebellar left cerebrospinal fluid density unchanged. No mass effect or midline shift. No hydrocephalus. Basilar cisterns are patent. Vascular: No hyperdense vessel. Skull: No acute fracture or focal lesion. Other: None. CT MAXILLOFACIAL FINDINGS Osseous: Age-indeterminate fracture of several maxillary teeth. No definite periapical lucency to suggest loosening. No fracture or mandibular dislocation. No destructive process. Sinuses/Orbits: Paranasal sinuses and mastoid air cells are clear. The orbits are unremarkable. Soft tissues: Negative. CT CERVICAL SPINE FINDINGS Alignment: Slight straightening of the normal cervical lordosis likely due to positioning. Skull base and vertebrae: Multilevel mild degenerative changes of the spine worse at the  C5-C6 and C6-C7 levels. No acute fracture. No aggressive appearing focal osseous lesion or focal pathologic process. Soft tissues and spinal canal: No prevertebral fluid or swelling. No visible canal hematoma. Disc levels:  Maintained. Upper chest: Unremarkable. Other: None. IMPRESSION: 1. No acute intracranial abnormality. 2. No acute displaced facial fracture. 3. No acute displaced fracture or traumatic listhesis of the cervical spine. 4. Age-indeterminate fracture of several maxillary teeth. Recommend clinical correlation with physical exam and history. Electronically Signed   By: Iven Finn M.D.   On: 08/16/2020 23:14   CT Cervical Spine Wo Contrast  Result Date: 08/16/2020 CLINICAL DATA:  Status post fall, hitting head on floor. Has had several recent falls . EXAM: CT HEAD WITHOUT CONTRAST CT MAXILLOFACIAL WITHOUT CONTRAST CT CERVICAL SPINE WITHOUT CONTRAST TECHNIQUE: Multidetector CT imaging of the head, cervical spine, and maxillofacial structures were performed using the standard protocol without intravenous contrast. Multiplanar CT image reconstructions  of the cervical spine and maxillofacial structures were also generated. COMPARISON:  CT head and cervical spine 04/30/2017 FINDINGS: CT HEAD FINDINGS Brain: Patchy and confluent areas of decreased attenuation are noted throughout the deep and periventricular white matter of the cerebral hemispheres bilaterally, compatible with chronic microvascular ischemic disease. No evidence of large-territorial acute infarction. No parenchymal hemorrhage. No mass lesion. No extra-axial collection. Similar-appearing retro cerebellar left cerebrospinal fluid density unchanged. No mass effect or midline shift. No hydrocephalus. Basilar cisterns are patent. Vascular: No hyperdense vessel. Skull: No acute fracture or focal lesion. Other: None. CT MAXILLOFACIAL FINDINGS Osseous: Age-indeterminate fracture of several maxillary teeth. No definite periapical lucency to suggest loosening. No fracture or mandibular dislocation. No destructive process. Sinuses/Orbits: Paranasal sinuses and mastoid air cells are clear. The orbits are unremarkable. Soft tissues: Negative. CT CERVICAL SPINE FINDINGS Alignment: Slight straightening of the normal cervical lordosis likely due to positioning. Skull base and vertebrae: Multilevel mild degenerative changes of the spine worse at the C5-C6 and C6-C7 levels. No acute fracture. No aggressive appearing focal osseous lesion or focal pathologic process. Soft tissues and spinal canal: No prevertebral fluid or swelling. No visible canal hematoma. Disc levels:  Maintained. Upper chest: Unremarkable. Other: None. IMPRESSION: 1. No acute intracranial abnormality. 2. No acute displaced facial fracture. 3. No acute displaced fracture or traumatic listhesis of the cervical spine. 4. Age-indeterminate fracture of several maxillary teeth. Recommend clinical correlation with physical exam and history. Electronically Signed   By: Iven Finn M.D.   On: 08/16/2020 23:14   US RENAL  Result Date:  08/19/2020 CLINICAL DATA:  AKI EXAM: RENAL / URINARY TRACT ULTRASOUND COMPLETE COMPARISON:  03/23/2020 FINDINGS: Right Kidney: Renal measurements: 10.5 x 4.9 x 5.2 cm = volume: 140 mL. Increased cortical echogenicity. Thinly septated cyst of the inferior pole of the right kidney. No mass or hydronephrosis visualized. Left Kidney: Renal measurements: 12.0 x 5.2 x 3.8 cm = volume: 125 mL. Increased cortical echogenicity. 5 mm shadowing calculus in the midportion. Simple cysts of the superior pole of the left kidney. No mass or hydronephrosis visualized. Bladder: Appears normal for degree of bladder distention. Other: None. IMPRESSION: 1. Increased cortical echogenicity of the kidneys, in keeping with medical renal disease. No hydronephrosis. 2.  Nonobstructive left nephrolithiasis. Electronically Signed   By: Eddie Candle M.D.   On: 08/19/2020 08:21   DG Chest Port 1 View  Result Date: 08/16/2020 CLINICAL DATA:  Fall EXAM: PORTABLE CHEST 1 VIEW COMPARISON:  None. FINDINGS: The heart size and mediastinal contours are within normal limits. A left-sided pacemaker  seen with the lead tips at the right ventricle. Overlying median sternotomy wires are present. Both lungs are clear. The visualized skeletal structures are unremarkable. IMPRESSION: No active disease. Electronically Signed   By: Prudencio Pair M.D.   On: 08/16/2020 23:12   ECHOCARDIOGRAM COMPLETE  Result Date: 08/19/2020    ECHOCARDIOGRAM REPORT   Patient Name:   KEILON BEEK Newbury Date of Exam: 08/19/2020 Medical Rec #:  [id number]            Height:       70.0 in Accession #:    [id number]           Weight:       175.9 lb Date of Birth:  1955-12-25            BSA:          1.977 m Patient Age:    13 years             BP:           117/81 mmHg Patient Gender: M                    HR:           66 bpm. Exam Location:  Inpatient Procedure: 2D Echo, Color Doppler, Limited Color Doppler and Intracardiac            Opacification Agent Indications:    I50.40*  Unspecified combined systolic (congestive) and diastolic                 (congestive) heart failure  History:        Patient has prior history of Echocardiogram examinations, most                 recent 01/12/2018. CHF and Cardiomyopathy, CAD and Previous                 Myocardial Infarction, Defibrillator, Abnormal ECG and Prior                 CABG, Arrythmias:VT, Signs/Symptoms:Syncope; Risk                 Factors:Hypertension and Dyslipidemia.  Sonographer:    Roseanna Rainbow RDCS Referring Phys: [phone] A CALDWELL Stanley  1. Left ventricular ejection fraction, by estimation, is 30 to 35%. The left ventricle has moderately decreased function. The left ventricle demonstrates global hypokinesis. Left ventricular diastolic parameters are consistent with Grade I diastolic dysfunction (impaired relaxation).  2. Right ventricular systolic function is mildly reduced. The right ventricular size is normal. There is mildly elevated pulmonary artery systolic pressure. The estimated right ventricular systolic pressure is Q000111Q mmHg.  3. The mitral valve is grossly normal. Trivial mitral valve regurgitation. No evidence of mitral stenosis.  4. The aortic valve is grossly normal. Aortic valve regurgitation is mild. No aortic stenosis is present.  5. Aortic dilatation noted. There is borderline dilatation of the ascending aorta, measuring 38 mm.  6. The inferior vena cava is dilated in size with <50% respiratory variability, suggesting right atrial pressure of 15 mmHg. Conclusion(s)/Recommendation(s): No left ventricular mural or apical thrombus/thrombi. FINDINGS  Left Ventricle: Left ventricular ejection fraction, by estimation, is 30 to 35%. The left ventricle has moderately decreased function. The left ventricle demonstrates global hypokinesis. Definity contrast agent was given IV to delineate the left ventricular endocardial borders. The left ventricular internal cavity size was normal in size. There is no left  ventricular hypertrophy. Left ventricular  diastolic parameters are consistent with Grade I diastolic dysfunction (impaired relaxation).  LV Wall Scoring: The apical lateral segment, apical septal segment, apical anterior segment, and apical inferior segment are akinetic. Right Ventricle: The right ventricular size is normal. No increase in right ventricular wall thickness. Right ventricular systolic function is mildly reduced. There is mildly elevated pulmonary artery systolic pressure. The tricuspid regurgitant velocity  is 2.60 m/s, and with an assumed right atrial pressure of 15 mmHg, the estimated right ventricular systolic pressure is Q000111Q mmHg. Left Atrium: Left atrial size was normal in size. Right Atrium: Right atrial size was normal in size. Pericardium: There is no evidence of pericardial effusion. Presence of pericardial fat pad. Mitral Valve: The mitral valve is grossly normal. Mild mitral annular calcification. Trivial mitral valve regurgitation. No evidence of mitral valve stenosis. Tricuspid Valve: The tricuspid valve is normal in structure. Tricuspid valve regurgitation is mild . No evidence of tricuspid stenosis. Aortic Valve: The aortic valve is grossly normal. Aortic valve regurgitation is mild. No aortic stenosis is present. Pulmonic Valve: The pulmonic valve was grossly normal. Pulmonic valve regurgitation is trivial. No evidence of pulmonic stenosis. Aorta: Aortic dilatation noted. There is borderline dilatation of the ascending aorta, measuring 38 mm. Venous: The inferior vena cava was not well visualized. The inferior vena cava is dilated in size with less than 50% respiratory variability, suggesting right atrial pressure of 15 mmHg. IAS/Shunts: The interatrial septum was not well visualized. Additional Comments: A pacer wire is visualized in the right atrium and right ventricle.  LEFT VENTRICLE PLAX 2D LVIDd:         5.40 cm      Diastology LVIDs:         4.10 cm      LV e' medial:    3.57  cm/s LV PW:         0.90 cm      LV E/e' medial:  11.7 LV IVS:        0.90 cm      LV e' lateral:   10.60 cm/s LVOT diam:     2.10 cm      LV E/e' lateral: 3.9 LV SV:         46 LV SV Index:   23 LVOT Area:     3.46 cm  LV Volumes (MOD) LV vol d, MOD A2C: 176.0 ml LV vol d, MOD A4C: 147.3 ml LV vol s, MOD A2C: 111.0 ml LV vol s, MOD A4C: 84.9 ml LV SV MOD A2C:     65.0 ml LV SV MOD A4C:     147.3 ml LV SV MOD BP:      66.7 ml RIGHT VENTRICLE            IVC RV S prime:     7.62 cm/s  IVC diam: 2.70 cm TAPSE (M-mode): 1.5 cm LEFT ATRIUM             Index       RIGHT ATRIUM           Index LA diam:        3.60 cm 1.82 cm/m  RA Area:     13.90 cm LA Vol (A2C):   42.7 ml 21.60 ml/m RA Volume:   33.00 ml  16.70 ml/m LA Vol (A4C):   48.9 ml 24.74 ml/m LA Biplane Vol: 50.2 ml 25.40 ml/m  AORTIC VALVE LVOT Vmax:   78.30 cm/s LVOT Vmean:  47.300 cm/s LVOT VTI:  0.133 m  AORTA Ao Root diam: 3.60 cm Ao Asc diam:  3.80 cm MITRAL VALVE               TRICUSPID VALVE MV Area (PHT): 3.27 cm    TR Peak grad:   27.0 mmHg MV Decel Time: 232 msec    TR Vmax:        260.00 cm/s MV E velocity: 41.70 cm/s MV A velocity: 76.20 cm/s  SHUNTS MV E/A ratio:  0.55        Systemic VTI:  0.13 m                            Systemic Diam: 2.10 cm Cherlynn Kaiser MD Electronically signed by Cherlynn Kaiser MD Signature Date/Time: 08/19/2020/7:37:21 PM    Final    VAS [place] LOWER EXTREMITY VENOUS (DVT)  Result Date: 08/19/2020  Lower Venous DVT Study Indications: Covid-19, d-dimer.  Comparison Study: No prior studies. Performing Technologist: Darlin Coco RDMS  Examination Guidelines: A complete evaluation includes B-mode imaging, spectral Doppler, color Doppler, and power Doppler as needed of all accessible portions of each vessel. Bilateral testing is considered an integral part of a complete examination. Limited examinations for reoccurring indications may be performed as noted. The reflux portion of the exam is performed with the patient  in reverse Trendelenburg.  +---------+---------------+---------+-----------+----------+--------------+ RIGHT    CompressibilityPhasicitySpontaneityPropertiesThrombus Aging +---------+---------------+---------+-----------+----------+--------------+ CFV      Full           Yes      Yes                                 +---------+---------------+---------+-----------+----------+--------------+ SFJ      Full                                                        +---------+---------------+---------+-----------+----------+--------------+ FV Prox  Full                                                        +---------+---------------+---------+-----------+----------+--------------+ FV Mid   Full                                                        +---------+---------------+---------+-----------+----------+--------------+ FV DistalFull                                                        +---------+---------------+---------+-----------+----------+--------------+ PFV      Full                                                        +---------+---------------+---------+-----------+----------+--------------+  POP      Full           Yes      Yes                                 +---------+---------------+---------+-----------+----------+--------------+ PTV      Full                                                        +---------+---------------+---------+-----------+----------+--------------+ PERO     Full                                                        +---------+---------------+---------+-----------+----------+--------------+   +---------+---------------+---------+-----------+----------+--------------+ LEFT     CompressibilityPhasicitySpontaneityPropertiesThrombus Aging +---------+---------------+---------+-----------+----------+--------------+ CFV      Full           Yes      Yes                                  +---------+---------------+---------+-----------+----------+--------------+ SFJ      Full                                                        +---------+---------------+---------+-----------+----------+--------------+ FV Prox  Full                                                        +---------+---------------+---------+-----------+----------+--------------+ FV Mid   Full                                                        +---------+---------------+---------+-----------+----------+--------------+ FV DistalFull                                                        +---------+---------------+---------+-----------+----------+--------------+ PFV      Full                                                        +---------+---------------+---------+-----------+----------+--------------+ POP      Full           Yes      Yes                                 +---------+---------------+---------+-----------+----------+--------------+  PTV      Full                                                        +---------+---------------+---------+-----------+----------+--------------+ PERO     Full                                                        +---------+---------------+---------+-----------+----------+--------------+     Summary: RIGHT: - There is no evidence of deep vein thrombosis in the lower extremity.  - No cystic structure found in the popliteal fossa.  LEFT: - There is no evidence of deep vein thrombosis in the lower extremity.  - No cystic structure found in the popliteal fossa.  *See table(s) above for measurements and observations. Electronically signed by Ruta Hinds MD on 08/19/2020 at 5:17:49 PM.    Final    CT Maxillofacial WO CM  Result Date: 08/16/2020 CLINICAL DATA:  Status post fall, hitting head on floor. Has had several recent falls . EXAM: CT HEAD WITHOUT CONTRAST CT MAXILLOFACIAL WITHOUT CONTRAST CT CERVICAL SPINE WITHOUT CONTRAST  TECHNIQUE: Multidetector CT imaging of the head, cervical spine, and maxillofacial structures were performed using the standard protocol without intravenous contrast. Multiplanar CT image reconstructions of the cervical spine and maxillofacial structures were also generated. COMPARISON:  CT head and cervical spine 04/30/2017 FINDINGS: CT HEAD FINDINGS Brain: Patchy and confluent areas of decreased attenuation are noted throughout the deep and periventricular white matter of the cerebral hemispheres bilaterally, compatible with chronic microvascular ischemic disease. No evidence of large-territorial acute infarction. No parenchymal hemorrhage. No mass lesion. No extra-axial collection. Similar-appearing retro cerebellar left cerebrospinal fluid density unchanged. No mass effect or midline shift. No hydrocephalus. Basilar cisterns are patent. Vascular: No hyperdense vessel. Skull: No acute fracture or focal lesion. Other: None. CT MAXILLOFACIAL FINDINGS Osseous: Age-indeterminate fracture of several maxillary teeth. No definite periapical lucency to suggest loosening. No fracture or mandibular dislocation. No destructive process. Sinuses/Orbits: Paranasal sinuses and mastoid air cells are clear. The orbits are unremarkable. Soft tissues: Negative. CT CERVICAL SPINE FINDINGS Alignment: Slight straightening of the normal cervical lordosis likely due to positioning. Skull base and vertebrae: Multilevel mild degenerative changes of the spine worse at the C5-C6 and C6-C7 levels. No acute fracture. No aggressive appearing focal osseous lesion or focal pathologic process. Soft tissues and spinal canal: No prevertebral fluid or swelling. No visible canal hematoma. Disc levels:  Maintained. Upper chest: Unremarkable. Other: None. IMPRESSION: 1. No acute intracranial abnormality. 2. No acute displaced facial fracture. 3. No acute displaced fracture or traumatic listhesis of the cervical spine. 4. Age-indeterminate fracture of  several maxillary teeth. Recommend clinical correlation with physical exam and history. Electronically Signed   By: Iven Finn M.D.   On: 08/16/2020 23:14   US Abdomen Limited RUQ (LIVER/GB)  Result Date: 08/18/2020 CLINICAL DATA:  Elevated liver function test. EXAM: ULTRASOUND ABDOMEN LIMITED RIGHT UPPER QUADRANT COMPARISON:  March 24, 2019. FINDINGS: Gallbladder: Status post cholecystectomy. Common bile duct: Diameter: 2 mm which is within normal limits. Liver: No focal lesion identified. Within normal limits in parenchymal echogenicity. Portal vein is patent on color Doppler imaging with normal direction  of blood flow towards the liver. Other: None. IMPRESSION: Status post cholecystectomy. No other abnormality seen in the right upper quadrant of the abdomen. Electronically Signed   By: Marijo Conception M.D.   On: 08/18/2020 10:26    Microbiology: Recent Results (from the past 240 hour(s))  Resp Panel by RT-PCR (Flu A&B, Covid) Nasopharyngeal Swab     Status: Abnormal   Collection Time: 08/17/20  4:26 AM   Specimen: Nasopharyngeal Swab; Nasopharyngeal(NP) swabs in vial transport medium  Result Value Ref Range Status   SARS Coronavirus 2 by RT PCR POSITIVE (A) NEGATIVE Final    Comment: RESULT CALLED TO, READ BACK BY AND VERIFIED WITH: NEAL,K AT 0516 ON [id number] BY CHERESNOWSKY,T (NOTE) SARS-CoV-2 target nucleic acids are DETECTED.  The SARS-CoV-2 RNA is generally detectable in upper respiratory specimens during the acute phase of infection. Positive results are indicative of the presence of the identified virus, but do not rule out bacterial infection or co-infection with other pathogens not detected by the test. Clinical correlation with patient history and other diagnostic information is necessary to determine patient infection status. The expected result is Negative.  Fact Sheet for Patients: EntrepreneurPulse.com.au  Fact Sheet for Healthcare  Providers: IncredibleEmployment.be  This test is not yet approved or cleared by the [place] FDA and  has been authorized for detection and/or diagnosis of SARS-CoV-2 by FDA under an Emergency Use Authorization (EUA).  This EUA will remain in effect (meaning this te st can be used) for the duration of  the COVID-19 declaration under Section 564(b)(1) of the Act, 21 U.S.C. section 360bbb-3(b)(1), unless the authorization is terminated or revoked sooner.     Influenza A by PCR NEGATIVE NEGATIVE Final   Influenza B by PCR NEGATIVE NEGATIVE Final    Comment: (NOTE) The Xpert Xpress SARS-CoV-2/FLU/RSV plus assay is intended as an aid in the diagnosis of influenza from Nasopharyngeal swab specimens and should not be used as a sole basis for treatment. Nasal washings and aspirates are unacceptable for Xpert Xpress SARS-CoV-2/FLU/RSV testing.  Fact Sheet for Patients: EntrepreneurPulse.com.au  Fact Sheet for Healthcare Providers: IncredibleEmployment.be  This test is not yet approved or cleared by the [place] FDA and has been authorized for detection and/or diagnosis of SARS-CoV-2 by FDA under an Emergency Use Authorization (EUA). This EUA will remain in effect (meaning this test can be used) for the duration of the COVID-19 declaration under Section 564(b)(1) of the Act, 21 U.S.C. section 360bbb-3(b)(1), unless the authorization is terminated or revoked.  Performed at [facility], [address] [place], [place] [address]      Labs: Basic Metabolic Panel: Recent Labs  Lab 08/18/20 0334 08/19/20 0302 08/20/20 0405 08/22/20 0422 08/23/20 0417 08/24/20 0451  NA 138 140 137 138 142 138  K 4.0 3.9 4.0 3.7 4.5 4.5  CL 100 102 104 110 111 110  CO2 '25 22 22 '$ 18* 21* 20*  GLUCOSE 87 83 77 91 74 50*  BUN 39* 43* 47* 39* 29* 26*  CREATININE 4.89* 5.09* 5.02* 4.33* 3.74* 3.24*  CALCIUM 9.2 8.8* 8.7* 8.8*  9.2 9.4  MG 2.1  --   --  2.2 2.0 1.9  PHOS 3.4  --   --  3.0 2.9 2.7   Liver Function Tests: Recent Labs  Lab 08/19/20 0302 08/20/20 0405 08/22/20 0422 08/23/20 0417 08/24/20 0451  AST 757* 901* 437* 251* 169*  ALT 726* 751* 513* 391* 308*  ALKPHOS 115 114 153* 169* 180*  BILITOT 1.0 1.0 0.9 0.7 1.1  PROT 6.8 5.9* 5.8* 6.0* 6.2*  ALBUMIN 3.5 3.0* 2.9* 3.0* 3.1*   No results for input(s): LIPASE, AMYLASE in the last 168 hours. No results for input(s): AMMONIA in the last 168 hours. CBC: Recent Labs  Lab 08/19/20 0302 08/20/20 0405 08/22/20 0422 08/23/20 0417 08/24/20 0451  WBC 6.8 5.0 4.5 4.2 4.3  NEUTROABS  --   --  2.9 2.6 2.8  HGB 15.4 13.4 13.6 14.3 14.7  HCT 48.2 41.7 43.8 44.7 46.7  MCV 100.6* 99.0 102.1* 101.4* 101.5*  PLT 137* 109* 129* 107* 133*   Cardiac Enzymes: No results for input(s): CKTOTAL, CKMB, CKMBINDEX, TROPONINI in the last 168 hours. BNP: BNP (last 3 results) Recent Labs    08/17/20 0638  BNP 71.4    ProBNP (last 3 results) No results for input(s): PROBNP in the last 8760 hours.  CBG: Recent Labs  Lab 08/24/20 0821 08/24/20 0842 08/24/20 0858 08/24/20 0932  GLUCAP 45* 43* 57* 70       Signed:  Dia Crawford, MD [facility] [phone] pager

## 2020-08-25 LAB — CORTISOL-AM, BLOOD: Cortisol - AM: 5.5 ug/dL — ABNORMAL LOW (ref 6.7–22.6)

## 2020-08-27 ENCOUNTER — Telehealth: Payer: Self-pay | Admitting: Internal Medicine

## 2020-08-27 NOTE — Telephone Encounter (Signed)
Spoke with pt who states he was recently discharged from 8 day hospital admission after passing out at home.  Pt was diagnosed with Covid.  Pt states he will be unable to complete GXT next Tuesday but will be able to attend his OV with Dr [name].  Would like to have Dr [name] review his echo that was completed during hospital stay as he did not understand how hospitalist explained results.  Pt advised will forward information to dr [name] and will cancel appt for GXT.  Pt verbalizes understanding and agrees with current plan.

## 2020-08-27 NOTE — Telephone Encounter (Signed)
Richard Cook is calling requesting a callback from a nurse to discuss his upcoming appointments due to recently being hospitalized and them not being able to determine what exactly is wrong. Please advise.

## 2020-08-29 NOTE — Telephone Encounter (Signed)
GXT and covid screening appointments canceled per pt request.

## 2020-08-30 ENCOUNTER — Other Ambulatory Visit (HOSPITAL_COMMUNITY): Payer: PPO

## 2020-09-03 ENCOUNTER — Ambulatory Visit: Payer: PPO | Admitting: Internal Medicine

## 2020-09-03 ENCOUNTER — Other Ambulatory Visit: Payer: Self-pay

## 2020-09-03 ENCOUNTER — Encounter: Payer: Self-pay | Admitting: Internal Medicine

## 2020-09-03 ENCOUNTER — Other Ambulatory Visit (HOSPITAL_COMMUNITY): Payer: PPO

## 2020-09-03 VITALS — BP 100/72 | HR 85 | Ht 70.0 in | Wt 171.0 lb

## 2020-09-03 DIAGNOSIS — I255 Ischemic cardiomyopathy: Secondary | ICD-10-CM | POA: Diagnosis not present

## 2020-09-03 DIAGNOSIS — Z79899 Other long term (current) drug therapy: Secondary | ICD-10-CM | POA: Diagnosis not present

## 2020-09-03 DIAGNOSIS — Z95810 Presence of automatic (implantable) cardiac defibrillator: Secondary | ICD-10-CM | POA: Diagnosis not present

## 2020-09-03 DIAGNOSIS — I472 Ventricular tachycardia, unspecified: Secondary | ICD-10-CM

## 2020-09-03 DIAGNOSIS — I5022 Chronic systolic (congestive) heart failure: Secondary | ICD-10-CM

## 2020-09-03 DIAGNOSIS — R748 Abnormal levels of other serum enzymes: Secondary | ICD-10-CM
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
## 2020-09-03 NOTE — Patient Instructions (Addendum)
Medication Instructions:  Your physician has recommended you make the following change in your medication:   ** Resume Amiodarone as previously prescribed  *If you need a refill on your cardiac medications before your next appointment, please call your pharmacy*   Lab Work: Liver Panel today If you have labs (blood work) drawn today and your tests are completely normal, you will receive your results only by: Marland Kitchen MyChart Message (if you have MyChart) OR . A paper copy in the mail If you have any lab test that is abnormal or we need to change your treatment, we will call you to review the results.   Testing/Procedures: None ordered.    Follow-Up: At [facility], you and your health needs are our priority.  As part of our continuing mission to provide you with exceptional heart care, we have created designated Provider Care Teams.  These Care Teams include your primary Cardiologist (physician) and Advanced Practice Providers (APPs -  Physician Assistants and Nurse Practitioners) who all work together to provide you with the care you need, when you need it.  We recommend signing up for the patient portal called "MyChart".  Sign up information is provided on this After Visit Summary.  MyChart is used to connect with patients for Virtual Visits (Telemedicine).  Patients are able to view lab/test results, encounter notes, upcoming appointments, etc.  Non-urgent messages can be sent to your provider as well.   To learn more about what you can do with MyChart, go to [email or web address].    Your next appointment:   4 month(s)  The format for your next appointment:   In Person  Provider:   Virl Axe, MD

## 2020-09-04 ENCOUNTER — Telehealth: Payer: Self-pay | Admitting: Internal Medicine

## 2020-09-04 LAB — CUP PACEART INCLINIC DEVICE CHECK
Battery Remaining Longevity: 56 mo
Battery Voltage: 2.97 V
Brady Statistic RV Percent Paced: 0.03 %
Date Time Interrogation Session: 20220201164200
HighPow Impedance: 91 Ohm
Implantable Lead Implant Date: 20150318
Implantable Lead Location: 753860
Implantable Lead Model: 181
Implantable Lead Serial Number: 327195
Implantable Pulse Generator Implant Date: 20150318
Lead Channel Impedance Value: 703 Ohm
Lead Channel Impedance Value: 703 Ohm
Lead Channel Pacing Threshold Amplitude: 0.75 V
Lead Channel Pacing Threshold Pulse Width: 0.4 ms
Lead Channel Sensing Intrinsic Amplitude: 11.5 mV
Lead Channel Sensing Intrinsic Amplitude: 9.375 mV
Lead Channel Setting Pacing Amplitude: 2 V
Lead Channel Setting Pacing Pulse Width: 0.4 ms
Lead Channel Setting Sensing Sensitivity: 0.3 mV

## 2020-09-04 LAB — HEPATIC FUNCTION PANEL
ALT: 28 IU/L (ref 0–44)
AST: 18 IU/L (ref 0–40)
Albumin: 4.5 g/dL (ref 3.8–4.8)
Alkaline Phosphatase: 248 IU/L — ABNORMAL HIGH (ref 44–121)
Bilirubin Total: 0.6 mg/dL (ref 0.0–1.2)
Bilirubin, Direct: 0.24 mg/dL (ref 0.00–0.40)
Total Protein: 7.3 g/dL (ref 6.0–8.5)

## 2020-09-04 NOTE — Telephone Encounter (Signed)
Patient states at his appointment they were supposed to discuss a stress test, because when his HR goes up his vitals go down. Please advise.

## 2020-09-04 NOTE — Telephone Encounter (Signed)
Patient states that he has to cancel a previously scheduled stress test because he was in the hospital. He saw Dr. [name] in office yesterday and ordering a new test was not discussed. He is wondering if this is something he should re schedule or if Dr. [name] has an alternate test to recommend?    Sending to Dr. [name] and his RN for advisement.

## 2020-09-05 NOTE — Telephone Encounter (Signed)
Let him know that I dont think I want to do it Thanks SK

## 2020-09-06 ENCOUNTER — Ambulatory Visit (INDEPENDENT_AMBULATORY_CARE_PROVIDER_SITE_OTHER): Payer: PPO

## 2020-09-06 DIAGNOSIS — I5022 Chronic systolic (congestive) heart failure: Secondary | ICD-10-CM

## 2020-09-06 DIAGNOSIS — I472 Ventricular tachycardia, unspecified: Secondary | ICD-10-CM

## 2020-09-06 LAB — CUP PACEART REMOTE DEVICE CHECK
Battery Remaining Longevity: 55 mo
Battery Voltage: 2.96 V
Brady Statistic RV Percent Paced: 0.02 %
Date Time Interrogation Session: 20220204001707
HighPow Impedance: 82 Ohm
Implantable Lead Implant Date: 20150318
Implantable Lead Location: 753860
Implantable Lead Model: 181
Implantable Lead Serial Number: 327195
Implantable Pulse Generator Implant Date: 20150318
Lead Channel Impedance Value: 551 Ohm
Lead Channel Impedance Value: 589 Ohm
Lead Channel Pacing Threshold Amplitude: 1 V
Lead Channel Pacing Threshold Pulse Width: 0.4 ms
Lead Channel Sensing Intrinsic Amplitude: 8 mV
Lead Channel Sensing Intrinsic Amplitude: 8 mV
Lead Channel Setting Pacing Amplitude: 2 V
Lead Channel Setting Pacing Pulse Width: 0.4 ms
Lead Channel Setting Sensing Sensitivity: 0.3 mV

## 2020-09-06 NOTE — Telephone Encounter (Signed)
Spoke with pt and advised per Dr [name] will not be pursing GXT at this time.  Pt verbalizes understanding and thanked Therapist, sports for the call.

## 2020-09-10 NOTE — Progress Notes (Signed)
Remote ICD transmission.   

## 2020-09-11 ENCOUNTER — Other Ambulatory Visit: Payer: Self-pay | Admitting: Family Medicine

## 2020-09-11 DIAGNOSIS — M5489 Other dorsalgia: Secondary | ICD-10-CM

## 2020-09-11 MED ORDER — HYDROCODONE-ACETAMINOPHEN 10-325 MG PO TABS: 1.0000 | ORAL_TABLET | Freq: Three times a day (TID) | ORAL | 0 refills | Status: DC | PRN

## 2020-09-11 NOTE — Telephone Encounter (Signed)
Requested medication (s) are due for refill today: no  Requested medication (s) are on the active medication list: yes  Future visit scheduled: yes  Notes to clinic: medication is being requested was filled by a historical provider  Review for refill    Requested Prescriptions  Pending Prescriptions Disp Refills   HYDROcodone-Acetaminophen 10-325 MG/15ML SOLN      Sig: Take by mouth as needed.      Not Delegated - Analgesics:  Opioid Agonist Combinations Failed - 09/11/2020 10:28 AM      Failed - This refill cannot be delegated      Failed - Urine Drug Screen completed in last 360 days      Passed - Valid encounter within last 6 months    Recent Outpatient Visits           2 months ago Depression, major, single episode, moderate ([place])   [facility] Birdie Sons, MD   5 months ago Syncope, unspecified syncope type   [place], [place]rix, PA-C   5 months ago Insomnia, unspecified type   [facility] Birdie Sons, MD   7 months ago Seborrhea capitis   [facility] Birdie Sons, MD   8 months ago CAD- PCI to RCA 08/10/11, CABG in [place] 5/14   [place], Kirstie Peri, MD       Future Appointments             In 5 days [place], Kelby Aline, [place], [place]

## 2020-09-11 NOTE — Telephone Encounter (Signed)
Copied from [place] [id number]. Topic: Quick Communication - Rx Refill/Question >> Sep 11, 2020 10:25 AM Tessa Lerner A wrote: Medication: HYDROcodone-Acetaminophen 10-325 MG/15ML   Has the patient contacted their pharmacy? No. Patient has not contacted pharmacy   Preferred Pharmacy (with phone number or street name): [id number] [place] [place] Phone:  [phone] Agent: Please be advised that RX refills may take up to 3 business days. We ask that you follow-up with your pharmacy.

## 2020-09-16 ENCOUNTER — Telehealth: Payer: Self-pay

## 2020-09-16 ENCOUNTER — Ambulatory Visit: Payer: PPO | Admitting: Adult Health

## 2020-09-16 ENCOUNTER — Ambulatory Visit: Payer: PPO | Admitting: Physician Assistant

## 2020-09-16 DIAGNOSIS — Z91199 Patient's noncompliance with other medical treatment and regimen due to unspecified reason: Secondary | ICD-10-CM | POA: Insufficient documentation

## 2020-09-16 DIAGNOSIS — Z5329 Procedure and treatment not carried out because of patient's decision for other reasons: Secondary | ICD-10-CM | POA: Insufficient documentation

## 2020-09-16 NOTE — Telephone Encounter (Signed)
Pt referred to [facility] clinic by Dr [name].  Attempted ICM referral call and left message to return cal with contact number.

## 2020-09-16 NOTE — Progress Notes (Signed)
No show

## 2020-09-18 ENCOUNTER — Telehealth: Payer: Self-pay

## 2020-09-18 NOTE — Telephone Encounter (Signed)
-----   Message from [name], MD sent at 09/10/2020  1:50 PM EST -----  Please inform patient that drug surveillance labs are normal  This is great news Thanks SK

## 2020-09-18 NOTE — Telephone Encounter (Signed)
Attempted phone call to pt.  Per Epic ok to leave voicemail message.  Left voicemail adviseing pt per Dr Adine Madura are normal.  This is great news!  Please call [phone] for any additional questions or needs.

## 2020-10-02 NOTE — Telephone Encounter (Signed)
Attempted ICM Intro call and left message with direct number for return call.

## 2020-10-07 NOTE — Telephone Encounter (Signed)
Spoke with patient and ICM intro given.  Explained reason for call and he agreed to monthly ICM call.  Patient reports compliance with taking Bumex every other day but does't understand how he can still have fluid after taking diuretic because he feels drained of fluid the next day.  Education given diet and fluid intake can cause fluid fluctuations.  Explained previous device reports show history of fluid accumulation.  Advised will schedule Carelink Device report on 3/11 to check fluid levels.  He says he has several medications prescribed by Dr [name] that need refilling but does not have the meds with him.  Advised to call pharmacy and pharmacy will get refill prescriptions but he reports the pharmacy told him to call the physician.  Advised to call back and will send refill request to refill department.  Pt has ICM direct number.

## 2020-10-10 ENCOUNTER — Ambulatory Visit: Payer: Self-pay | Admitting: Neurology

## 2020-10-10 ENCOUNTER — Telehealth: Payer: Self-pay | Admitting: *Deleted

## 2020-10-10 ENCOUNTER — Encounter: Payer: Self-pay | Admitting: Neurology

## 2020-10-10 NOTE — Telephone Encounter (Signed)
No showed new patient appointment. 

## 2020-10-11 ENCOUNTER — Telehealth: Payer: Self-pay

## 2020-10-11 ENCOUNTER — Ambulatory Visit: Payer: PPO

## 2020-10-11 DIAGNOSIS — I5022 Chronic systolic (congestive) heart failure: Secondary | ICD-10-CM

## 2020-10-11 DIAGNOSIS — Z95810 Presence of automatic (implantable) cardiac defibrillator: Secondary | ICD-10-CM

## 2020-10-11 NOTE — Progress Notes (Signed)
EPIC Encounter for ICM Monitoring  Patient Name: Richard Cook is a 65 y.o. male Date: 10/11/2020 Primary Care Physican: Birdie Sons, MD Primary Cardiologist: [name] Electrophysiologist: [name] 09/03/2020 Office Weight: 171 lbs        1st ICM remote transmission.  Attempted call to patient and unable to reach.  Left detailed message per DPR regarding transmission. Transmission reviewed.    Optivol thoracic impedance suggesting possible fluid accumulation from 08/17/20 - until transmission date of 10/11/20.   Prescribed: Bumetanide 2 mg take 1 tablet daily  Recommendations: Left voice mail with ICM number and encouraged to call if experiencing any fluid symptoms..  Follow-up plan: ICM clinic phone appointment on 10/28/2020.   91 day device clinic remote transmission 12/06/2020.    EP/Cardiology Office Visits: Recall 01/01/2021 with Dr. [name].    Copy of ICM check sent to Dr. [name].   3 month ICM trend: 10/11/2020.    1 Year ICM trend:       Rosalene Billings, RN 10/11/2020 12:46 PM

## 2020-10-11 NOTE — Telephone Encounter (Signed)
Remote ICM transmission received.  Attempted call to patient regarding ICM remote transmission and left detailed message per DPR.  Advised to return call for any fluid symptoms or questions. Next ICM remote transmission scheduled 10/28/2020.

## 2020-10-21 ENCOUNTER — Other Ambulatory Visit: Payer: Self-pay | Admitting: Family Medicine

## 2020-10-21 DIAGNOSIS — M5489 Other dorsalgia: Secondary | ICD-10-CM

## 2020-10-21 MED ORDER — HYDROCODONE-ACETAMINOPHEN 10-325 MG PO TABS: 1.0000 | ORAL_TABLET | Freq: Three times a day (TID) | ORAL | 0 refills | Status: DC | PRN

## 2020-10-21 NOTE — Telephone Encounter (Signed)
Requested medication (s) are due for refill today: yes  Requested medication (s) are on the active medication list: yes  Last refill: 09/11/20  Future visit scheduled:no  Notes to clinic: not delegated   Requested Prescriptions  Pending Prescriptions Disp Refills   HYDROcodone-acetaminophen (NORCO) 10-325 MG tablet 60 tablet 0    Sig: Take 1 tablet by mouth every 8 (eight) hours as needed.      Not Delegated - Analgesics:  Opioid Agonist Combinations Failed - 10/21/2020 12:01 PM      Failed - This refill cannot be delegated      Failed - Urine Drug Screen completed in last 360 days      Passed - Valid encounter within last 6 months    Recent Outpatient Visits           1 month ago No-show for appointment   [place], FNP   4 months ago Depression, major, single episode, moderate [facility])   [facility] Birdie Sons, MD   6 months ago Syncope, unspecified syncope type   [place], [place], PA-C   7 months ago Insomnia, unspecified type   [facility] Birdie Sons, MD   9 months ago Seborrhea capitis   [facility] Caryn Section, Kirstie Peri, MD

## 2020-10-21 NOTE — Telephone Encounter (Signed)
Medication: HYDROcodone-acetaminophen (NORCO) 10-325 MG tablet Has the pt contacted their pharmacy? no [id number] [place], [place]  Please be advised refills may take up to 3 business days.  We ask that you follow up with your pharmacy.

## 2020-10-28 ENCOUNTER — Telehealth: Payer: Self-pay

## 2020-10-28 ENCOUNTER — Ambulatory Visit: Payer: Self-pay

## 2020-10-28 ENCOUNTER — Telehealth: Payer: Self-pay | Admitting: Internal Medicine

## 2020-10-28 ENCOUNTER — Ambulatory Visit (INDEPENDENT_AMBULATORY_CARE_PROVIDER_SITE_OTHER): Payer: PPO

## 2020-10-28 DIAGNOSIS — I5022 Chronic systolic (congestive) heart failure: Secondary | ICD-10-CM

## 2020-10-28 DIAGNOSIS — Z95810 Presence of automatic (implantable) cardiac defibrillator: Secondary | ICD-10-CM | POA: Diagnosis not present

## 2020-10-28 NOTE — Progress Notes (Signed)
EPIC Encounter for ICM Monitoring  Patient Name: Richard Cook is a 65 y.o. male Date: 10/28/2020 Primary Care Physican: Birdie Sons, MD Primary Cardiologist: [name] Electrophysiologist: [name] 09/03/2020 Office Weight: 171 lbs                                                            Attempted call to patient and unable to reach.  Transmission reviewed.    Optivol thoracic impedance suggesting possible ongoing fluid accumulation starting 08/17/2020.   Prescribed: Bumetanide 2 mg take 1 tablet daily  Recommendations: Unable to reach.    Follow-up plan: ICM clinic phone appointment on 11/11/2020 to recheck fluid levels.   91 day device clinic remote transmission 12/06/2020.    EP/Cardiology Office Visits: Recall 01/01/2021 with Dr. [name].    Copy of ICM check sent to Dr. [name].   3 month ICM trend: 10/28/2020.    1 Year ICM trend:       Rosalene Billings, RN 10/28/2020 12:15 PM

## 2020-10-28 NOTE — Telephone Encounter (Addendum)
Spoke with pt and advised according to referral in pt's chart he has been referred to neurology d/t history of falls and balance issues.  Encouraged pt to keep appointment with neurology for further evaluation.  Pt states he fell down 8 stairs earlier today and hit his elbow and his head. He did not lose consciousness. Pt states he contacted his PCP and asked if he should go to ED but has not heard back yet.  Pt states he is "bruised and banged up" but believes he is OK.  Pt advised he should at the very least be seen by his PCP or urgent care for evaluation.  Pt verbalizes understanding and is agreeable.  Pt will contact his PCP office again re: follow up for fall.

## 2020-10-28 NOTE — Telephone Encounter (Signed)
New Message:      Pt said he have been referred to a Neurologist. He wanted to know if Dr [name] knew why he was transferred to a  Neurologist. He knows Dr [name] did not refer him, he just did not understand why he needs to see a Neurologist.

## 2020-10-28 NOTE — Telephone Encounter (Signed)
Remote ICM transmission received.  Attempted call to patient regarding ICM remote transmission and no answer or voice mail option.  

## 2020-10-28 NOTE — Telephone Encounter (Signed)
Carrying laundry foot caught either step or laundry and fell down  8  Concrete steps. Pt stated he hit the right side of his forehead, wrist cut, right  elbow and hip, back spasms. Pt stated he  took Advil and hydrocodone. Pt stated he has a mild headache, and difficulty maintaining balance. Pt had difficulty recalling his phone prefix and day of the month. Pt knew his location and that he was talking to a nurse.  Pt did not have LOC or nausea.  Care advice given and pt plans to call friend to take him to [facility] ED.   Reason for Disposition . [1] ACUTE NEURO SYMPTOM AND [2] now fine  (DEFINITION: difficult to awaken OR confused thinking and talking OR slurred speech OR weakness of arms OR unsteady walking)  Answer Assessment - Initial Assessment Questions 1. MECHANISM: "How did the injury happen?" For falls, ask: "What height did you fall from?" and "What surface did you fall against?"      Tripped on laundry and fell down 8 flights of steps" 2. ONSET: "When did the injury happen?" (Minutes or hours ago)      1030 this am 3. NEUROLOGIC SYMPTOMS: "Was there any loss of consciousness?" "Are there any other neurological symptoms?"      No- headache-loss of balance 4. MENTAL STATUS: "Does the person know who he is, who you are, and where he is?"      Home- phone number- 5. LOCATION: "What part of the head was hit?"      Forehead on right hand side 6. SCALP APPEARANCE: "What does the scalp look like? Is it bleeding now?" If Yes, ask: "Is it difficult to stop?"      no 7. SIZE: For cuts, bruises, or swelling, ask: "How large is it?" (e.g., inches or centimeters)      Dime size right wrist- bruises on right wrist 8. PAIN: "Is there any pain?" If Yes, ask: "How bad is it?"  (e.g., Scale 1-10; or mild, moderate, severe)     Mild to moderate headache, dizziness, off balance 9. TETANUS: For any breaks in the skin, ask: "When was the last tetanus booster?"     unknown 10. OTHER SYMPTOMS: "Do you have  any other symptoms?" (e.g., neck pain, vomiting)       Back and hip pain, cut wrist cant recall date. Slow to respond to correct phone number 11. PREGNANCY: "Is there any chance you are pregnant?" "When was your last menstrual period?"       n/a  Protocols used: HEAD INJURY-A-AH

## 2020-10-31 ENCOUNTER — Telehealth: Payer: Self-pay

## 2020-10-31 NOTE — Telephone Encounter (Signed)
Copied from [place] [id number]. Topic: Complaint - Billing/Coding >> Oct 31, 2020 11:56 AM Erick Blinks wrote: DOS: 09/16/2020 Details of complaint: Pt received a bill for a no show fee  How would the patient like to see this issue resolved? Pt wants to dispute this with office, he says he was not made aware of this appt. "It's listed no where on his records from the doctor" please advise  Best contact: [phone]  Route to Practice Administrator.

## 2020-11-01 ENCOUNTER — Other Ambulatory Visit: Payer: Self-pay | Admitting: *Deleted

## 2020-11-01 MED ORDER — MEXILETINE HCL 250 MG PO CAPS: 250.0000 mg | ORAL_CAPSULE | Freq: Three times a day (TID) | ORAL | 2 refills | Status: DC

## 2020-11-02 ENCOUNTER — Other Ambulatory Visit: Payer: Self-pay | Admitting: Family Medicine

## 2020-11-02 DIAGNOSIS — G4700 Insomnia, unspecified: Secondary | ICD-10-CM

## 2020-11-04 DIAGNOSIS — N2581 Secondary hyperparathyroidism of renal origin: Secondary | ICD-10-CM | POA: Diagnosis not present

## 2020-11-04 DIAGNOSIS — N184 Chronic kidney disease, stage 4 (severe): Secondary | ICD-10-CM | POA: Diagnosis not present

## 2020-11-04 DIAGNOSIS — D631 Anemia in chronic kidney disease: Secondary | ICD-10-CM | POA: Diagnosis not present

## 2020-11-04 DIAGNOSIS — I129 Hypertensive chronic kidney disease with stage 1 through stage 4 chronic kidney disease, or unspecified chronic kidney disease: Secondary | ICD-10-CM | POA: Diagnosis not present

## 2020-11-04 NOTE — Telephone Encounter (Signed)
Copied from [place] [id number]. Topic: Complaint - Billing/Coding >> Oct 31, 2020 11:56 AM Erick Blinks wrote: DOS: 09/16/2020 Details of complaint: Pt received a bill for a no show fee  How would the patient like to see this issue resolved? Pt wants to dispute this with office, he says he was not made aware of this appt. "It's listed no where on his records from the doctor" please advise  Best contact: [phone]  Route to Practice Administrator.

## 2020-11-04 NOTE — Telephone Encounter (Signed)
Copied from [place] [phone]. Topic: Complaint - Billing/Coding >> Oct 31, 2020 11:56 AM Erick Blinks wrote: DOS: 09/16/2020 Details of complaint: Pt received a bill for a no show fee  How would the patient like to see this issue resolved? Pt wants to dispute this with office, he says he was not made aware of this appt. "It's listed no where on his records from the doctor" please advise  Best contact: [phone]  Route to Practice Administrator.

## 2020-11-04 NOTE — Telephone Encounter (Signed)
Copied from [place] [phone]. Topic: Complaint - Billing/Coding >> Oct 31, 2020 11:56 AM Erick Blinks wrote: DOS: 09/16/2020 Details of complaint: Pt received a bill for a no show fee  How would the patient like to see this issue resolved? Pt wants to dispute this with office, he says he was not made aware of this appt. "It's listed no where on his records from the doctor" please advise  Best contact: [phone]  Route to Practice Administrator.

## 2020-11-06 DIAGNOSIS — I129 Hypertensive chronic kidney disease with stage 1 through stage 4 chronic kidney disease, or unspecified chronic kidney disease: Secondary | ICD-10-CM | POA: Diagnosis not present

## 2020-11-06 DIAGNOSIS — N184 Chronic kidney disease, stage 4 (severe): Secondary | ICD-10-CM | POA: Diagnosis not present

## 2020-11-06 DIAGNOSIS — D631 Anemia in chronic kidney disease: Secondary | ICD-10-CM | POA: Diagnosis not present

## 2020-11-06 DIAGNOSIS — N2581 Secondary hyperparathyroidism of renal origin: Secondary | ICD-10-CM | POA: Diagnosis not present

## 2020-11-07 ENCOUNTER — Telehealth: Payer: Self-pay

## 2020-11-07 ENCOUNTER — Telehealth: Payer: Self-pay | Admitting: Internal Medicine

## 2020-11-07 NOTE — Telephone Encounter (Signed)
Attempted to call patient to assist with sending a transmission. No answer, LMOVM.

## 2020-11-07 NOTE — Telephone Encounter (Signed)
Spent 32 minutes on phone w/ pt. 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 After leaving while driving he began to feel "deathly ill, sick to stomach". Denies any CP, sob, palpitations, dizziness. Near end of conversation he then reports this is same feeling he had prior to his last shock.   Advised pt to send in manual transmission for [place] to review.  Pt reports not a home currently but will send when he gets home. He reports balance issues since his ablation many years ago.  States "could he have nicked a nerve or something that could have caused me to have balance issues"?  When asked if has discussed this with Dr. [name] he reports that he has.  Advised that if Dr [name] felt that his balance issues were r/t to his heart/ablation he would have stated so and ordered further testing. Pt continues to state he just wants to know why he is having issues with balance. He stated he was going to have a stress test beginning of June.  I informed that he was to f/u with Dr. [name] beginning of June and no stress was ordered.  Reminded pt of the phone call 2/4 when Rosann Auerbach, RN informed him that Dr. [name] did want to pursue GXT at this time. Pt reports at first RN did not tell him that, he then stated he didn't remember if she did. Educated pt that with his current reported balance issues/fall he should be walking on a treadmill anyway.  He argues that he still hikes and can walk on a treadmill -- informed pt will leave it up to MD if he would like to reschedule testing. He also reports that his kidney doctor increased his Midodrine yesterday d/t ortho hypo.  Reports sitting BP was 113/73 and dropped to 97/65 when standing. Advised pt that he should follow up w/ his PCP on this matter (issue has been ongoing since Covid infection earlier this year), along w/ neurology.  Advised that I don't  think Dr. [name] would be following this for him at this point but would leave it up to Dr. [name] to decide if he wants to further evaluate/follow this issue. Discussed the importance of neurology referral to further evaluate. Aware I will forward note to device clinic to be on the lookout for transmission. Aware I will forward to Dr. [name] and his nurse to discuss what, if any, cardiology needs/appt.

## 2020-11-07 NOTE — Telephone Encounter (Signed)
Patient states he feels really sick to his stomach, nauseas, and has trouble swallowing. He states he is not having any chest pain or SOB. He states he is not having any other symptoms and has not started any new medications. 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

## 2020-11-07 NOTE — Telephone Encounter (Signed)
I am not aware of conversations about balance issues, but known hypotension and orthostatic hypotension.  CT 1/22 showed chronic vascular  ischemic changes which might explain some of the problems that he has had   Neurology is a good thing but we will work with his team about the orthostatic symptoms

## 2020-11-07 NOTE — Telephone Encounter (Signed)
Clarified with patient and he reports he is feeling better. Patient aware to call device clinic if symptoms begin again.

## 2020-11-07 NOTE — Telephone Encounter (Signed)
Spoke with pt who states he has tried to send device transmission.  He states he receives a code of 3230 when sending. Will forward information to device clinic to assist pt.  Pt verbalizes understanding.

## 2020-11-07 NOTE — Telephone Encounter (Signed)
Called patient to see if I could help him with sending a transmission. Patient states his monitor is lighting up an orange light with error code 3230 & 3248. Advised patient he will need to call [employer] to see if they are able to assist him. Advised patient to call device clinic to give [place] update.

## 2020-11-11 ENCOUNTER — Encounter: Payer: Self-pay | Admitting: Neurology

## 2020-11-11 ENCOUNTER — Telehealth: Payer: Self-pay

## 2020-11-11 ENCOUNTER — Other Ambulatory Visit: Payer: Self-pay

## 2020-11-11 ENCOUNTER — Ambulatory Visit (INDEPENDENT_AMBULATORY_CARE_PROVIDER_SITE_OTHER): Payer: PPO

## 2020-11-11 ENCOUNTER — Ambulatory Visit: Payer: PPO | Admitting: Neurology

## 2020-11-11 VITALS — Ht 70.0 in | Wt 173.0 lb

## 2020-11-11 DIAGNOSIS — Z95810 Presence of automatic (implantable) cardiac defibrillator: Secondary | ICD-10-CM

## 2020-11-11 DIAGNOSIS — I5022 Chronic systolic (congestive) heart failure: Secondary | ICD-10-CM

## 2020-11-11 DIAGNOSIS — M545 Low back pain, unspecified: Secondary | ICD-10-CM | POA: Diagnosis not present

## 2020-11-11 DIAGNOSIS — G8929 Other chronic pain: Secondary | ICD-10-CM

## 2020-11-11 DIAGNOSIS — R269 Unspecified abnormalities of gait and mobility: Secondary | ICD-10-CM
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
## 2020-11-11 NOTE — Progress Notes (Signed)
EPIC Encounter for ICM Monitoring  Patient Name: Richard Cook is a 65 y.o. male Date: 11/11/2020 Primary Care Physican: Birdie Sons, MD Primary Cardiologist:Klein Electrophysiologist:Klein 2/1/2022OfficeWeight:171lbs    Attempted call to patient and unable to reach.   Transmission reviewed.   Optivol thoracic impedance suggesting possible ongoing fluid accumulation starting 08/17/2020.    Prescribed:Bumetanide 2 mg take 1 tablet daily  Labs: 08/24/2020 Creatinine 3.24, BUN 26, Potassium 4.5, Sodium 138, GFR 21 08/23/2020 Creatinine 3.74, BUN 29, Potassium 4.5, Sodium 142, GFR 17  08/22/2020 Creatinine 4.33, BUN 39, Potassium 3.7, Sodium 138, GFR 15  08/20/2020 Creatinine 5.02, BUN 47, Potassium 4.0, Sodium 137, GFR 12  08/19/2020 Creatinine 2.09, BUN 43, Potassium 3.9, Sodium 140, GFR 12  A complete set of results can be found in Results Review.  Recommendations:Unable to reach.    Follow-up plan: ICM clinic phone appointment on5/09/2020. 91 day device clinic remote transmission 12/06/2020.   EP/Cardiology Office Visits:Recall 01/01/2021 with Dr.Klein.   Copy of ICM check sent to [place].   3 month ICM trend: 11/11/2020.    1 Year ICM trend:       Rosalene Billings, RN 11/11/2020 10:29 AM

## 2020-11-11 NOTE — Telephone Encounter (Signed)
Remote ICM transmission received.  Attempted call to patient regarding ICM remote transmission and no answer or voice mail option.  

## 2020-11-12 ENCOUNTER — Telehealth: Payer: Self-pay | Admitting: Neurology

## 2020-11-12 NOTE — Telephone Encounter (Signed)
Pt transmitted 11/11/2020

## 2020-11-12 NOTE — Telephone Encounter (Signed)
health team order sent to GI. No auth they will reach out to the patient to schedule.  

## 2020-11-14 ENCOUNTER — Telehealth: Payer: Self-pay | Admitting: *Deleted

## 2020-11-14 LAB — COMPREHENSIVE METABOLIC PANEL
ALT: 17 IU/L (ref 0–44)
AST: 15 IU/L (ref 0–40)
Albumin/Globulin Ratio: 1.7 (ref 1.2–2.2)
Albumin: 4.6 g/dL (ref 3.8–4.8)
Alkaline Phosphatase: 157 IU/L — ABNORMAL HIGH (ref 44–121)
BUN/Creatinine Ratio: 10 (ref 10–24)
BUN: 48 mg/dL — ABNORMAL HIGH (ref 8–27)
Bilirubin Total: 0.7 mg/dL (ref 0.0–1.2)
CO2: 19 mmol/L — ABNORMAL LOW (ref 20–29)
Calcium: 9.6 mg/dL (ref 8.6–10.2)
Chloride: 102 mmol/L (ref 96–106)
Creatinine, Ser: 4.84 mg/dL — ABNORMAL HIGH (ref 0.76–1.27)
Globulin, Total: 2.7 g/dL (ref 1.5–4.5)
Glucose: 82 mg/dL (ref 65–99)
Potassium: 3.8 mmol/L (ref 3.5–5.2)
Sodium: 142 mmol/L (ref 134–144)
Total Protein: 7.3 g/dL (ref 6.0–8.5)
eGFR: 13 mL/min/{1.73_m2} — ABNORMAL LOW (ref >59)

## 2020-11-14 LAB — COPPER, SERUM: Copper: 111 ug/dL (ref 69–132)

## 2020-11-14 LAB — RPR: RPR Ser Ql: NONREACTIVE

## 2020-11-14 LAB — VITAMIN B12: Vitamin B-12: 364 pg/mL (ref 232–1245)

## 2020-11-14 LAB — CK: Total CK: 57 U/L (ref 41–331)

## 2020-11-14 LAB — AMMONIA: Ammonia: 64 ug/dL (ref 40–200)

## 2020-11-14 NOTE — Telephone Encounter (Signed)
Spoke with patient and informed him labs showed blood work is unremarkable with exception of continued worsening of renal function, stage IV renal failure, liver enzymes have improved, alkaline phosphatase is still elevated slightly, but significantly improved. All other blood work is unremarkable.  He stated he is feeling much better since beginning tapering clonazepam on Monday. 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 He wanted Dr Jannifer Franklin to be aware he is feeling better. I advised will send him note. Patient verbalized understanding, appreciation.

## 2020-11-15 ENCOUNTER — Other Ambulatory Visit: Payer: Self-pay | Admitting: Family Medicine

## 2020-11-15 DIAGNOSIS — F411 Generalized anxiety disorder: Secondary | ICD-10-CM

## 2020-11-15 NOTE — Telephone Encounter (Signed)
Requested medication (s) are due for refill today: Yes  Requested medication (s) are on the active medication list: Yes  Last refill:  07/02/20  Future visit scheduled: No  Notes to clinic:  See request.    Requested Prescriptions  Pending Prescriptions Disp Refills   clonazePAM (KLONOPIN) 1 MG tablet [Pharmacy Med Name: CLONAZEPAM 1 MG TABLET] 75 tablet 3    Sig: TAKE ONE TABLET EVERY AFTERNOON AND 1 & 1/2 TABLET AT BEDTIME      Not Delegated - Psychiatry:  Anxiolytics/Hypnotics Failed - 11/15/2020  3:38 PM      Failed - This refill cannot be delegated      Failed - Urine Drug Screen completed in last 360 days      Passed - Valid encounter within last 6 months    Recent Outpatient Visits           2 months ago No-show for appointment   [facility] Flinchum, Kelby Aline, FNP   5 months ago Depression, major, single episode, moderate [facility])   [facility] Birdie Sons, MD   7 months ago Syncope, unspecified syncope type   [place], [place], PA-C   7 months ago Insomnia, unspecified type   [facility] Birdie Sons, MD   9 months ago Seborrhea capitis   [facility] Caryn Section, Kirstie Peri, MD

## 2020-11-15 NOTE — Telephone Encounter (Signed)
Pt called in to follow up on refill request for clonazePAM (KLONOPIN) 1 MG tablet. Pt says that he thought he had a refill left at pharmacy, pt says that he has 1 pill left and hope to not be out over the weekend.     [facility]  [id number] [place]:  [phone] Fax:  [phone]    Please assist

## 2020-11-15 NOTE — Telephone Encounter (Signed)
Requested medication (s) are due for refill today: Yes  Requested medication (s) are on the active medication list: Yes  Last refill:  07/02/20  Future visit scheduled: No  Notes to clinic:  See request.    Requested Prescriptions  Pending Prescriptions Disp Refills   clonazePAM (KLONOPIN) 1 MG tablet [Pharmacy Med Name: CLONAZEPAM 1 MG TABLET] 75 tablet 3    Sig: TAKE ONE TABLET EVERY AFTERNOON AND 1 & 1/2 TABLET AT BEDTIME      Not Delegated - Psychiatry:  Anxiolytics/Hypnotics Failed - 11/15/2020  3:47 PM      Failed - This refill cannot be delegated      Failed - Urine Drug Screen completed in last 360 days      Passed - Valid encounter within last 6 months    Recent Outpatient Visits           2 months ago No-show for appointment   [facility] Flinchum, Kelby Aline, FNP   5 months ago Depression, major, single episode, moderate [facility])   [facility] Birdie Sons, MD   7 months ago Syncope, unspecified syncope type   [place], [place], PA-C   7 months ago Insomnia, unspecified type   [facility] Birdie Sons, MD   9 months ago Seborrhea capitis   [facility] Caryn Section, Kirstie Peri, MD

## 2020-11-18 ENCOUNTER — Telehealth: Payer: Self-pay

## 2020-11-18 NOTE — Telephone Encounter (Signed)
Copied from [place] [id number]. Topic: General - Other >> Nov 18, 2020  1:04 PM Celene Kras wrote: Reason for CRM: Pt calling stating that he should not be receiving a no show fee due to him not coming to his appt on 09/16/20. He states that he specifically asked for PCP and instead got another provider. Please advise.

## 2020-11-21 ENCOUNTER — Ambulatory Visit
Admission: RE | Admit: 2020-11-21 | Discharge: 2020-11-21 | Disposition: A | Discharge status: Home / Self Care | Payer: PPO | Source: Ambulatory Visit | Attending: Neurology | Admitting: Neurology

## 2020-11-21 DIAGNOSIS — R269 Unspecified abnormalities of gait and mobility: Secondary | ICD-10-CM

## 2020-11-21 DIAGNOSIS — M545 Low back pain, unspecified: Secondary | ICD-10-CM | POA: Diagnosis not present

## 2020-11-22 ENCOUNTER — Telehealth: Payer: Self-pay | Admitting: Neurology

## 2020-11-22 NOTE — Telephone Encounter (Signed)
  I called the patient.  CT of the lumbar spine shows only mild spinal stenosis at the L3-4 and L4-5 levels, some moderate right neuroforaminal stenosis at the L4-5 level, would not explain gait instability.  The patient will be trying to come down off of his clonazepam, this may be at least in part to the source of some of his gait instability.   CT lumbar 11/21/20:  IMPRESSION: 1. Diffuse lumbar disc and facet degeneration with mild spinal stenosis at L3-4 and L4-5. 2. Moderate right neural foraminal stenosis at L4-5. 3. Mild-to-moderate right neural foraminal stenosis at L3-4. 4. Aortic Atherosclerosis (ICD10-I70.0).

## 2020-11-28 ENCOUNTER — Other Ambulatory Visit: Payer: Self-pay | Admitting: Family Medicine

## 2020-11-28 DIAGNOSIS — M5489 Other dorsalgia: Secondary | ICD-10-CM

## 2020-11-28 MED ORDER — HYDROCODONE-ACETAMINOPHEN 10-325 MG PO TABS: 1.0000 | ORAL_TABLET | Freq: Three times a day (TID) | ORAL | 0 refills | Status: DC | PRN

## 2020-11-28 NOTE — Telephone Encounter (Signed)
Requested medication (s) are due for refill today: Yes  Requested medication (s) are on the active medication list: Yes  Last refill:  10/21/20  Future visit scheduled: No  Notes to clinic:  See request.    Requested Prescriptions  Pending Prescriptions Disp Refills   HYDROcodone-acetaminophen (NORCO) 10-325 MG tablet 60 tablet 0    Sig: Take 1 tablet by mouth every 8 (eight) hours as needed.      Not Delegated - Analgesics:  Opioid Agonist Combinations Failed - 11/28/2020 11:50 AM      Failed - This refill cannot be delegated      Failed - Urine Drug Screen completed in last 360 days      Passed - Valid encounter within last 6 months    Recent Outpatient Visits           2 months ago No-show for appointment   [place], FNP   5 months ago Depression, major, single episode, moderate [facility])   [facility] Birdie Sons, MD   7 months ago Syncope, unspecified syncope type   [place], [place], PA-C   8 months ago Insomnia, unspecified type   [facility] Birdie Sons, MD   10 months ago Seborrhea capitis   [facility] Caryn Section, Kirstie Peri, MD

## 2020-11-28 NOTE — Telephone Encounter (Signed)
Medication: HYDROcodone-acetaminophen (NORCO) 10-325 MG tablet  Has the pt contacted their pharmacy? no [id number] [place] [place] Please be advised refills may take up to 3 business days.  We ask that you follow up with your pharmacy.

## 2020-12-02 ENCOUNTER — Ambulatory Visit (INDEPENDENT_AMBULATORY_CARE_PROVIDER_SITE_OTHER): Payer: PPO

## 2020-12-02 DIAGNOSIS — I5022 Chronic systolic (congestive) heart failure: Secondary | ICD-10-CM | POA: Diagnosis not present

## 2020-12-02 DIAGNOSIS — Z95810 Presence of automatic (implantable) cardiac defibrillator: Secondary | ICD-10-CM | POA: Diagnosis not present

## 2020-12-04 ENCOUNTER — Telehealth: Payer: Self-pay

## 2020-12-04 NOTE — Progress Notes (Signed)
EPIC Encounter for ICM Monitoring  Patient Name: Richard Cook is a 65 y.o. male Date: 12/04/2020 Primary Care Physican: Birdie Sons, MD Primary Cardiologist:Klein Electrophysiologist:Klein 2/1/2022OfficeWeight:171lbs    Attempted call to patient and unable to reach.  Left detailed message per DPR regarding transmission. Transmission reviewed.   Optivol thoracic impedance suggesting fluid levels returned to normal after a long period of possiblefluid accumulationstarting1/15/2022.    Prescribed:Bumetanide 2 mg take 1 tablet daily  Labs: 08/24/2020 Creatinine 3.24, BUN 26, Potassium 4.5, Sodium 138, GFR 21 08/23/2020 Creatinine 3.74, BUN 29, Potassium 4.5, Sodium 142, GFR 17  08/22/2020 Creatinine 4.33, BUN 39, Potassium 3.7, Sodium 138, GFR 15  08/20/2020 Creatinine 5.02, BUN 47, Potassium 4.0, Sodium 137, GFR 12  08/19/2020 Creatinine 2.09, BUN 43, Potassium 3.9, Sodium 140, GFR 12  A complete set of results can be found in Results Review.  Recommendations:Left voice mail with ICM number and encouraged to call if experiencing any fluid symptoms.  Follow-up plan: ICM clinic phone appointment on6/13/2022. 91 day device clinic remote transmission 12/06/2020.   EP/Cardiology Office Visits: 01/20/2021 with Dr.Klein.   Copy of ICM check sent to [place].  3 month ICM trend: 12/02/2020.    1 Year ICM trend:       Rosalene Billings, RN 12/04/2020 12:08 PM

## 2020-12-04 NOTE — Telephone Encounter (Signed)
Remote ICM transmission received.  Attempted call to patient regarding ICM remote transmission and left detailed message per DPR.  Advised to return call for any fluid symptoms or questions. Next ICM remote transmission scheduled 01/13/2021.     

## 2020-12-06 ENCOUNTER — Ambulatory Visit (INDEPENDENT_AMBULATORY_CARE_PROVIDER_SITE_OTHER): Payer: PPO

## 2020-12-06 DIAGNOSIS — I472 Ventricular tachycardia, unspecified: Secondary | ICD-10-CM

## 2020-12-06 LAB — CUP PACEART REMOTE DEVICE CHECK
Battery Remaining Longevity: 50 mo
Battery Voltage: 2.97 V
Brady Statistic RV Percent Paced: 0.08 %
Date Time Interrogation Session: 20220506044224
HighPow Impedance: 87 Ohm
Implantable Lead Implant Date: 20150318
Implantable Lead Location: 753860
Implantable Lead Model: 181
Implantable Lead Serial Number: 327195
Implantable Pulse Generator Implant Date: 20150318
Lead Channel Impedance Value: 646 Ohm
Lead Channel Impedance Value: 646 Ohm
Lead Channel Pacing Threshold Amplitude: 0.875 V
Lead Channel Pacing Threshold Pulse Width: 0.4 ms
Lead Channel Sensing Intrinsic Amplitude: 9.375 mV
Lead Channel Sensing Intrinsic Amplitude: 9.375 mV
Lead Channel Setting Pacing Amplitude: 2 V
Lead Channel Setting Pacing Pulse Width: 0.4 ms
Lead Channel Setting Sensing Sensitivity: 0.3 mV

## 2020-12-13 DIAGNOSIS — N2581 Secondary hyperparathyroidism of renal origin: Secondary | ICD-10-CM | POA: Diagnosis not present

## 2020-12-13 DIAGNOSIS — I129 Hypertensive chronic kidney disease with stage 1 through stage 4 chronic kidney disease, or unspecified chronic kidney disease: Secondary | ICD-10-CM | POA: Diagnosis not present

## 2020-12-13 DIAGNOSIS — N184 Chronic kidney disease, stage 4 (severe): Secondary | ICD-10-CM | POA: Diagnosis not present

## 2020-12-13 DIAGNOSIS — D631 Anemia in chronic kidney disease: Secondary | ICD-10-CM | POA: Diagnosis not present

## 2020-12-17 ENCOUNTER — Ambulatory Visit: Payer: Self-pay | Admitting: Adult Health

## 2020-12-18 DIAGNOSIS — N2581 Secondary hyperparathyroidism of renal origin: Secondary | ICD-10-CM | POA: Diagnosis not present

## 2020-12-18 DIAGNOSIS — D631 Anemia in chronic kidney disease: Secondary | ICD-10-CM | POA: Diagnosis not present

## 2020-12-18 DIAGNOSIS — I129 Hypertensive chronic kidney disease with stage 1 through stage 4 chronic kidney disease, or unspecified chronic kidney disease: Secondary | ICD-10-CM | POA: Diagnosis not present

## 2020-12-18 DIAGNOSIS — N184 Chronic kidney disease, stage 4 (severe): Secondary | ICD-10-CM | POA: Diagnosis not present

## 2020-12-19 NOTE — Progress Notes (Signed)
Remote ICD transmission.   

## 2021-01-02 ENCOUNTER — Ambulatory Visit: Payer: Self-pay | Admitting: Neurology

## 2021-01-06 ENCOUNTER — Other Ambulatory Visit: Payer: Self-pay | Admitting: Family Medicine

## 2021-01-06 DIAGNOSIS — M5489 Other dorsalgia: Secondary | ICD-10-CM

## 2021-01-06 MED ORDER — HYDROCODONE-ACETAMINOPHEN 10-325 MG PO TABS: 1.0000 | ORAL_TABLET | Freq: Three times a day (TID) | ORAL | 0 refills | Status: DC | PRN

## 2021-01-06 NOTE — Telephone Encounter (Signed)
Requested medication (s) are due for refill today - unsure  Requested medication (s) are on the active medication list -yes  Future visit scheduled -no  Last refill: 1 month  Notes to clinic: Request RF- non delegated Rx  Requested Prescriptions  Pending Prescriptions Disp Refills   HYDROcodone-acetaminophen (NORCO) 10-325 MG tablet 60 tablet 0    Sig: Take 1 tablet by mouth every 8 (eight) hours as needed.      Not Delegated - Analgesics:  Opioid Agonist Combinations Failed - 01/06/2021 11:35 AM      Failed - This refill cannot be delegated      Failed - Urine Drug Screen completed in last 360 days      Failed - Valid encounter within last 6 months    Recent Outpatient Visits           3 months ago No-show for appointment   [facility] Fl[place], Kelby Aline, FNP   6 months ago Depression, major, single episode, moderate [facility])   [facility] Birdie Sons, MD   9 months ago Syncope, unspecified syncope type   [facility] Carles Collet M, PA-C   9 months ago Insomnia, unspecified type   [facility] Birdie Sons, MD   11 months ago Seborrhea capitis   [place], MD                    Requested Prescriptions  Pending Prescriptions Disp Refills   HYDROcodone-acetaminophen (NORCO) 10-325 MG tablet 60 tablet 0    Sig: Take 1 tablet by mouth every 8 (eight) hours as needed.      Not Delegated - Analgesics:  Opioid Agonist Combinations Failed - 01/06/2021 11:35 AM      Failed - This refill cannot be delegated      Failed - Urine Drug Screen completed in last 360 days      Failed - Valid encounter within last 6 months    Recent Outpatient Visits           3 months ago No-show for appointment   [place], FNP   6 months ago Depression, major, single episode, moderate [facility])   [facility] Birdie Sons, MD   9  months ago Syncope, unspecified syncope type   [place], PA-C   9 months ago Insomnia, unspecified type   [facility] Birdie Sons, MD   11 months ago Seborrhea capitis   [facility] Caryn Section, Kirstie Peri, MD

## 2021-01-06 NOTE — Telephone Encounter (Signed)
Medication Refill - Medication: HYDROcodone-acetaminophen (NORCO) 10-325 MG tablet    Preferred Pharmacy (with phone number or street name[id number] [place] [place] Please be advised that RX refills may take up to 3 business days. We ask that you follow-up with your pharmacy.

## 2021-01-10 ENCOUNTER — Telehealth: Payer: Self-pay | Admitting: Internal Medicine

## 2021-01-10 ENCOUNTER — Other Ambulatory Visit: Payer: Self-pay | Admitting: Internal Medicine

## 2021-01-10 NOTE — Telephone Encounter (Signed)
*  STAT* If patient is at the pharmacy, call can be transferred to refill team.   1. Which medications need to be refilled? (please list name of each medication and dose if known)  amiodarone (PACERONE) 200 MG tablet allopurinol (ZYLOPRIM) tablet 300 mg  metoprolol tartrate (LOPRESSOR) 25 MG tablet  2. Which pharmacy/location (including street and city if local pharmacy) is medication to be sent to? [id number] [place] [place] [address]. Do they need a 30 day or 90 day supply? 30 with refills   Patient called for refills of these three medications but none were on his current med list. Patient has been taking daily as directed. He is scheduled to see Dr. KCaryl Comes6/20/22

## 2021-01-10 NOTE — Telephone Encounter (Signed)
Called pt to inform him that the medications that he has requested is no longer on his medication list and that they were D/C by provider and that he is not supposed to still be taking these medications. Pt has an upcoming appt with Dr. [name] on 01/20/21 and that pt could discuss this matter with Dr. [name] at the appt. I advised the pt that if he has any other problems, questions or concerns, to give our office a call back. Pt verbalized understanding.

## 2021-01-17 NOTE — Progress Notes (Signed)
No ICM remote transmission received for 01/13/2021 and next ICM transmission scheduled for 01/27/2021.

## 2021-01-20 ENCOUNTER — Other Ambulatory Visit: Payer: Self-pay

## 2021-01-20 ENCOUNTER — Encounter: Payer: Self-pay | Admitting: Internal Medicine

## 2021-01-20 ENCOUNTER — Ambulatory Visit: Payer: PPO | Admitting: Internal Medicine

## 2021-01-20 VITALS — BP 146/81 | HR 71 | Ht 70.0 in | Wt 177.8 lb

## 2021-01-20 DIAGNOSIS — E032 Hypothyroidism due to medicaments and other exogenous substances: Secondary | ICD-10-CM

## 2021-01-20 DIAGNOSIS — I5042 Chronic combined systolic (congestive) and diastolic (congestive) heart failure: Secondary | ICD-10-CM | POA: Diagnosis not present

## 2021-01-20 DIAGNOSIS — I255 Ischemic cardiomyopathy: Secondary | ICD-10-CM | POA: Diagnosis not present

## 2021-01-20 DIAGNOSIS — Z95810 Presence of automatic (implantable) cardiac defibrillator: Secondary | ICD-10-CM

## 2021-01-20 DIAGNOSIS — I472 Ventricular tachycardia, unspecified: Secondary | ICD-10-CM

## 2021-01-20 MED ORDER — MIDODRINE HCL 5 MG PO TABS: 2.5000 mg | ORAL_TABLET | Freq: Three times a day (TID) | ORAL | 0 refills | Status: DC

## 2021-01-20 NOTE — Patient Instructions (Addendum)
Medication Instructions:  Your physician has recommended you make the following change in your medication:  **  Decrease Midodrine '5mg'$  to 1/2 tablet (2.'5mg'$ ) 3 times daily  *If you need a refill on your cardiac medications before your next appointment, please call your pharmacy*   Lab Work: TSH today  If you have labs (blood work) drawn today and your tests are completely normal, you will receive your results only by: [place] (if you have MyChart) OR A paper copy in the mail If you have any lab test that is abnormal or we need to change your treatment, we will call you to review the results.   Testing/Procedures: None ordered.    Follow-Up: At [facility], you and your health needs are our priority.  As part of our continuing mission to provide you with exceptional heart care, we have created designated Provider Care Teams.  These Care Teams include your primary Cardiologist (physician) and Advanced Practice Providers (APPs -  Physician Assistants and Nurse Practitioners) who all work together to provide you with the care you need, when you need it.  We recommend signing up for the patient portal called "MyChart".  Sign up information is provided on this After Visit Summary.  MyChart is used to connect with patients for Virtual Visits (Telemedicine).  Patients are able to view lab/test results, encounter notes, upcoming appointments, etc.  Non-urgent messages can be sent to your provider as well.   To learn more about what you can do with MyChart, go to [email or web address].    Your next appointment:   6 month(s)  The format for your next appointment:   In Person  Provider:   Virl Axe, MD
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
## 2021-01-21 ENCOUNTER — Other Ambulatory Visit: Payer: Self-pay | Admitting: Family Medicine

## 2021-01-21 LAB — TSH: TSH: 1.81 u[IU]/mL (ref 0.450–4.500)

## 2021-01-30 ENCOUNTER — Telehealth: Payer: Self-pay

## 2021-01-30 NOTE — Telephone Encounter (Signed)
Spoke with pt and advised per Dr [name] labs are normal.  Pt verbalized understanding and thanked Therapist, sports for the phone call.

## 2021-01-30 NOTE — Telephone Encounter (Signed)
-----   Message from [name], MD sent at 01/28/2021 10:02 PM EDT -----  Please inform patient that drug surveillance labs are normal

## 2021-01-31 ENCOUNTER — Telehealth: Payer: Self-pay | Admitting: Family Medicine

## 2021-01-31 NOTE — Telephone Encounter (Signed)
Referral Request - Has patient seen PCP for this complaint? Yes.   Pt states that PCP sent him to a Dermatologist, and he couldn't remember who.    Referral for which specialty: Dermatologist  Preferred provider/office: Any  Reason for referral: N/A

## 2021-01-31 NOTE — Telephone Encounter (Signed)
Need more information on the reason for referral.I don't see that we referred him to Dermatology recently. Tried calling patient. Left message to call back. OK for [facility] triage to speak with patient. Patient may need office visit before referral can me made.

## 2021-01-31 NOTE — Progress Notes (Signed)
No ICM remote transmission received for 01/27/2021 and next ICM transmission scheduled for 03/03/2021.

## 2021-02-04 NOTE — Telephone Encounter (Signed)
I called and spoke with patient. Patient is erquesting the name and contact information of the Dermatologist we last referred him to. I checked his chart and the last Dermatology referral was placed on 10/24/2018 to Dr. Sarina Ser. I gave this information to patient. He says that he has some places on his back that feel like soft scabs. Patient says that these lesions have been on his back for more than 1 year. He says that he scratches the sores off and they reappear 1 week later. Patient denies any pain, itching or bleeding from the lesions. Patient says he doesn't want a referral right now, he just wants the contact information to the last Dermatologist we sent him to.   Patient did request an appointment to discuss feeling off balance. He says sometimes his legs "go rubber" and he has to hold on to something to prevent from falling out. He says he was seen by a urologist who suggested that the Clonazepam may be contributing to him feeling off balanced. Patient states that he has cut the Clonazepam back to 1 tablet daily. This has helped, but he still feels off balance, especially when hiking. Patient scheduled and appointment to come into the office to discuss this problem on 02/24/2021 which was the soonest appointment available.

## 2021-02-04 NOTE — Telephone Encounter (Signed)
Attempted to call patient to get information requested from office- left message to call office.

## 2021-02-04 NOTE — Telephone Encounter (Signed)
Pt returned call, was made aware that more information is needed to consider his referral request. Pt declined to share any information, states he wants to speak to [place] privately. (Confidential)   Best contact: [phone]

## 2021-02-05 ENCOUNTER — Ambulatory Visit: Payer: PPO | Admitting: Internal Medicine

## 2021-02-24 ENCOUNTER — Ambulatory Visit (INDEPENDENT_AMBULATORY_CARE_PROVIDER_SITE_OTHER): Payer: PPO | Admitting: Family Medicine

## 2021-02-24 ENCOUNTER — Encounter: Payer: Self-pay | Admitting: Family Medicine

## 2021-02-24 ENCOUNTER — Other Ambulatory Visit: Payer: Self-pay

## 2021-02-24 VITALS — BP 137/92 | HR 90 | Temp 98.4°F | Resp 16 | Wt 178.4 lb

## 2021-02-24 DIAGNOSIS — R109 Unspecified abdominal pain: Secondary | ICD-10-CM | POA: Diagnosis not present

## 2021-02-24 DIAGNOSIS — L821 Other seborrheic keratosis: Secondary | ICD-10-CM | POA: Diagnosis not present

## 2021-02-24 DIAGNOSIS — F5101 Primary insomnia: Secondary | ICD-10-CM

## 2021-02-24 DIAGNOSIS — R110 Nausea: Secondary | ICD-10-CM | POA: Diagnosis not present

## 2021-02-24 DIAGNOSIS — F411 Generalized anxiety disorder: Secondary | ICD-10-CM

## 2021-02-24 MED ORDER — CLONAZEPAM 1 MG PO TABS: ORAL_TABLET | ORAL | Status: DC

## 2021-02-24 NOTE — Progress Notes (Signed)
Established patient visit   Patient: Richard Cook   DOB: [birth year]   65 y.o. Male  MRN: [id number] Visit Date: 02/24/2021  Today's healthcare provider: Lelon Huh, MD   Chief Complaint  Patient presents with   Dizziness   skin lesion   Subjective    Dizziness This is a new problem. Associated symptoms include weakness (in legs). Pertinent negatives include no abdominal pain, chest pain, chills, fever, nausea or vomiting.   Patient states he often feels off balanced. He says his legs suddenly "go rubber" and he has to hold on to something to prevent from falling out.   He was seen here for gait and balance problems in December and referred to neurology. He was seen by Dr. Jannifer Franklin at [facility] neurology for gait abnormality in April.  Dr. Jannifer Franklin had him taper clonazepam. MRI of lumbar spine revealed the following  1. Diffuse lumbar disc and facet degeneration with mild spinal stenosis at L3-4 and L4-5. 2. Moderate right neural foraminal stenosis at L4-5. 3. Mild-to-moderate right neural foraminal stenosis at L3-4.  this was not felt to contribute to gait instability. Dr. Jannifer Franklin referred him to neuro rehab. At Dr. Jannifer Franklin recommendation he has been cut the Clonazepam back to 1 tablet every evening, helps him get to sleep but still feels groggy in the morning. He states he was doing much better since cutting back dose, but has since had two episodes of getting weak and off balance in his legs while hiking and nearly fell. He wants to get off of clonazepam. However he does state he is having more anxiety since cutting back on clonazepam.   Also complains of nausea and burning in stomach  Skin Lesion: Patient complains of a soft brown skin lesion on his back. He states the lesions have been on his back for more than 1 year. He scratches the sores off and they reappear 1 week later. Patient denies any pain, itching or bleeding from the lesions     Medications: Outpatient  Medications Prior to Visit  Medication Sig   bumetanide (BUMEX) 2 MG tablet Take by mouth daily in the afternoon.   cholecalciferol (VITAMIN D3) 25 MCG (1000 UNIT) tablet Take 1,000 Units by mouth daily.   clonazePAM (KLONOPIN) 1 MG tablet TAKE ONE TABLET EVERY AFTERNOON AND 1 & 1/2 TABLET AT BEDTIME   HYDROcodone-acetaminophen (NORCO) 10-325 MG tablet Take 1 tablet by mouth every 8 (eight) hours as needed.   levothyroxine (SYNTHROID) 88 MCG tablet Take 1 tablet (88 mcg total) by mouth daily.   mexiletine (MEXITIL) 250 MG capsule Take 1 capsule (250 mg total) by mouth 3 (three) times daily.   Omega-3 Fatty Acids (FISH OIL PO) Take 1 capsule by mouth daily.   midodrine (PROAMATINE) 5 MG tablet Take 0.5 tablets (2.5 mg total) by mouth 3 (three) times daily with meals. (Patient not taking: Reported on 02/24/2021)   No facility-administered medications prior to visit.    Review of Systems  Constitutional:  Negative for appetite change, chills and fever.  Respiratory:  Negative for chest tightness, shortness of breath and wheezing.   Cardiovascular:  Negative for chest pain and palpitations.  Gastrointestinal:  Negative for abdominal pain, nausea and vomiting.  Neurological:  Positive for dizziness and weakness (in legs).       Feels off balance      Objective    BP (!) 137/92 (BP Location: Left Arm, Patient Position: Sitting, Cuff Size: Normal)   Pulse  90   Temp 98.4 F (36.9 C) (Temporal)   Resp 16   Wt 178 lb 6.4 oz (80.9 kg)   BMI 25.60 kg/m     Physical Exam   General: Appearance:     Well developed, well nourished male in no acute distress  Skin:    Occasional SK scattered across trunk. Non-inflamed.    MS:   All extremities are intact.    Neurologic:   Awake, alert, oriented x 3. No apparent focal neurological defect.        Assessment & Plan     1. Anxiety state Is a little worse since stopping daytime dose of clonazepam, but he thinks he can manage for now.   2.  Primary insomnia Currently taking 1 clonazepam at bedtime. Tried and failed several other sleep aids in the past. However he doesn't feel he has as much trouble sleeping as he used to. Will reduce to clonazePAM (KLONOPIN) 1 MG tablet; 1/2 tablet every day at bedtime  3. Abdominal pain, unspecified abdominal location  - Comprehensive metabolic panel - CBC - H. pylori breath test - Lipase  4. Nausea  - Comprehensive metabolic panel - CBC - H. pylori breath test - Lipase  5. Seborrheic keratosis Reassurance.         The entirety of the information documented in the History of Present Illness, Review of Systems and Physical Exam were personally obtained by me. Portions of this information were initially documented by the CMA and reviewed by me for thoroughness and accuracy.     Lelon Huh, MD  [facility] [phone] (phone) [phone] (fax)  [place]

## 2021-02-24 NOTE — Patient Instructions (Signed)
Please review the attached list of medications and notify my office if there are any errors.   Reduce clonazepam to 1/2 tablet at bedtime. Let me know if you want to change to a 0.'5mg'$  tablet

## 2021-02-26 LAB — COMPREHENSIVE METABOLIC PANEL WITH GFR
ALT: 16 IU/L (ref 0–44)
AST: 16 IU/L (ref 0–40)
Albumin/Globulin Ratio: 2.1 (ref 1.2–2.2)
Albumin: 4.9 g/dL — ABNORMAL HIGH (ref 3.8–4.8)
Alkaline Phosphatase: 151 IU/L — ABNORMAL HIGH (ref 44–121)
BUN/Creatinine Ratio: 10 (ref 10–24)
BUN: 36 mg/dL — ABNORMAL HIGH (ref 8–27)
Bilirubin Total: 0.6 mg/dL (ref 0.0–1.2)
CO2: 25 mmol/L (ref 20–29)
Calcium: 9.9 mg/dL (ref 8.6–10.2)
Chloride: 101 mmol/L (ref 96–106)
Creatinine, Ser: 3.44 mg/dL — ABNORMAL HIGH (ref 0.76–1.27)
Globulin, Total: 2.3 g/dL (ref 1.5–4.5)
Glucose: 87 mg/dL (ref 65–99)
Potassium: 5.2 mmol/L (ref 3.5–5.2)
Sodium: 144 mmol/L (ref 134–144)
Total Protein: 7.2 g/dL (ref 6.0–8.5)
eGFR: 19 mL/min/1.73 — ABNORMAL LOW

## 2021-02-26 LAB — CBC
Hematocrit: 50.5 % (ref 37.5–51.0)
Hemoglobin: 17 g/dL (ref 13.0–17.7)
MCH: 32.1 pg (ref 26.6–33.0)
MCHC: 33.7 g/dL (ref 31.5–35.7)
MCV: 96 fL (ref 79–97)
Platelets: 153 x10E3/uL (ref 150–450)
RBC: 5.29 x10E6/uL (ref 4.14–5.80)
RDW: 13.8 % (ref 11.6–15.4)
WBC: 6.1 x10E3/uL (ref 3.4–10.8)

## 2021-02-26 LAB — H. PYLORI BREATH TEST: H pylori Breath Test: NEGATIVE

## 2021-02-26 LAB — LIPASE: Lipase: 40 U/L (ref 13–78)

## 2021-03-04 ENCOUNTER — Other Ambulatory Visit: Payer: Self-pay | Admitting: Family Medicine

## 2021-03-04 DIAGNOSIS — M5489 Other dorsalgia: Secondary | ICD-10-CM

## 2021-03-04 NOTE — Telephone Encounter (Signed)
Requested medication (s) are due for refill today: yes  Requested medication (s) are on the active medication list: yes  Last refill: 01/06/21   #60  0 refills  Future visit scheduled no  Notes to clinic Not delegated Requested Prescriptions  Pending Prescriptions Disp Refills   HYDROcodone-acetaminophen (NORCO) 10-325 MG tablet 60 tablet 0    Sig: Take 1 tablet by mouth every 8 (eight) hours as needed.      Not Delegated - Analgesics:  Opioid Agonist Combinations Failed - 03/04/2021  4:00 PM      Failed - This refill cannot be delegated      Failed - Urine Drug Screen completed in last 360 days      Passed - Valid encounter within last 6 months    Recent Outpatient Visits           1 week ago Primary insomnia   [facility] Birdie Sons, MD   5 months ago No-show for appointment   [place], FNP   8 months ago Depression, major, single episode, moderate [facility])   [facility] Birdie Sons, MD   10 months ago Syncope, unspecified syncope type   [place], [place], [place]   11 months ago Insomnia, unspecified type   [facility] Birdie Sons, MD       Future Appointments             In 2 months Ralene Bathe, MD [place]

## 2021-03-04 NOTE — Telephone Encounter (Signed)
Copied from [place] [id number]. Topic: Quick Communication - Rx Refill/Question >> Mar 04, 2021  2:31 PM Leward Quan A wrote: Medication: HYDROcodone-acetaminophen (NORCO) 10-325 MG tablet   Has the patient contacted their pharmacy? No. (Agent: If no, request that the patient contact the pharmacy for the refill.) (Agent: If yes, when and what did the pharmacy advise?)  Preferred Pharmacy (with phone number or street name): [id number] [address], [place] Phone:  [phone]:  [phone]    Agent: Please be advised that RX refills may take up to 3 business days. We ask that you follow-up with your pharmacy.

## 2021-03-05 MED ORDER — HYDROCODONE-ACETAMINOPHEN 10-325 MG PO TABS: 1.0000 | ORAL_TABLET | Freq: Three times a day (TID) | ORAL | 0 refills | Status: DC | PRN

## 2021-03-13 ENCOUNTER — Telehealth: Payer: Self-pay

## 2021-03-13 NOTE — Telephone Encounter (Signed)
Pt spoke with [employer] and he will be receiving a new monitor in 2 weeks.

## 2021-03-13 NOTE — Telephone Encounter (Signed)
LMOVM for patient to send missed ICM transmission. 

## 2021-03-14 NOTE — Progress Notes (Signed)
No ICM remote transmission received for 03/10/2021 and next ICM transmission scheduled for 03/31/2021.  Waiting on arrival of new home monitor.

## 2021-03-18 ENCOUNTER — Ambulatory Visit: Payer: Self-pay | Admitting: *Deleted

## 2021-03-18 ENCOUNTER — Ambulatory Visit: Payer: PPO | Admitting: Neurology

## 2021-03-18 DIAGNOSIS — R110 Nausea: Secondary | ICD-10-CM

## 2021-03-18 MED ORDER — ONDANSETRON 4 MG PO TBDP: 4.0000 mg | ORAL_TABLET | Freq: Three times a day (TID) | ORAL | 0 refills | Status: DC | PRN

## 2021-03-18 NOTE — Telephone Encounter (Signed)
[facility],  but also recommend referral to GI for chronic nausea if he likes.

## 2021-03-18 NOTE — Telephone Encounter (Signed)
Per agent: "Patient called in with stomach issues, nausea and a little pain, wants to know wht he can do. He says he was told to all back if he is still having issues"  Pt saw Dr. Caryn Section 02/24/21 for Insomnia. States reported abdominal discomfort, told to CB if unresolved.  Pt states "Maybe a little worse." States burning sensation"Going down stomach and nausea"  Has been taking Prilosec for 7 days "Helps a little." Nausea, no vomiting. States "I feel sick 80% of the time." States PCP had mentioned GI referral "But maybe first  try a stronger med if there is one, since Prilosec helped some."  ALso reports takes Bumex 3 times weekly "And usually feels worse on those days. Has appt with kidney MD tomorrow and states will mention that. Pt requesting refill of anti nausea med prescribed before, does not recall name. States "Phenergan helps more but makes me sleepy." Assured pt NT would route to practice for PCPs review and final disposition.   Please advise: CB# [phone]     Reason for Disposition  Age > 60 years  Answer Assessment - Initial Assessment Questions 1. LOCATION: "Where does it hurt?"      Please see summary, Follow up call 2. RADIATION: "Does the pain shoot anywhere else?" (e.g., chest, back)     *No Answer* 3. ONSET: "When did the pain begin?" (Minutes, hours or days ago)      *No Answer* 4. SUDDEN: "Gradual or sudden onset?"     *No Answer* 5. PATTERN "Does the pain come and go, or is it constant?"    - If constant: "Is it getting better, staying the same, or worsening?"      (Note: Constant means the pain never goes away completely; most serious pain is constant and it progresses)     - If intermittent: "How long does it last?" "Do you have pain now?"     (Note: Intermittent means the pain goes away completely between bouts)     *No Answer* 6. SEVERITY: "How bad is the pain?"  (e.g., Scale 1-10; mild, moderate, or severe)    - MILD (1-3): doesn't interfere with normal  activities, abdomen soft and not tender to touch     - MODERATE (4-7): interferes with normal activities or awakens from sleep, abdomen tender to touch     - SEVERE (8-10): excruciating pain, doubled over, unable to do any normal activities       *No Answer* 7. RECURRENT SYMPTOM: "Have you ever had this type of stomach pain before?" If Yes, ask: "When was the last time?" and "What happened that time?"      *No Answer* 8. CAUSE: "What do you think is causing the stomach pain?"     *No Answer* 9. RELIEVING/AGGRAVATING FACTORS: "What makes it better or worse?" (e.g., movement, antacids, bowel movement)     *No Answer* 10. OTHER SYMPTOMS: "Do you have any other symptoms?" (e.g., back pain, diarrhea, fever, urination pain, vomiting)       *No Answer*  Protocols used: Abdominal Pain - Male-A-AH

## 2021-03-18 NOTE — Addendum Note (Signed)
Addended by: Birdie Sons on: 03/18/2021 05:03 PM   Modules accepted: Orders

## 2021-03-19 DIAGNOSIS — N2581 Secondary hyperparathyroidism of renal origin: Secondary | ICD-10-CM | POA: Diagnosis not present

## 2021-03-19 DIAGNOSIS — I129 Hypertensive chronic kidney disease with stage 1 through stage 4 chronic kidney disease, or unspecified chronic kidney disease: Secondary | ICD-10-CM | POA: Diagnosis not present

## 2021-03-19 DIAGNOSIS — N184 Chronic kidney disease, stage 4 (severe): Secondary | ICD-10-CM | POA: Diagnosis not present

## 2021-03-19 DIAGNOSIS — D631 Anemia in chronic kidney disease: Secondary | ICD-10-CM | POA: Diagnosis not present

## 2021-03-19 NOTE — Telephone Encounter (Signed)
Pt advised.  He would like to proceed with a referral to GI.   Thanks,   -Mickel Baas

## 2021-03-19 NOTE — Addendum Note (Signed)
Addended by: Ashley Royalty E on: 03/19/2021 09:03 AM   Modules accepted: Orders

## 2021-03-25 ENCOUNTER — Ambulatory Visit (INDEPENDENT_AMBULATORY_CARE_PROVIDER_SITE_OTHER): Payer: PPO

## 2021-03-25 ENCOUNTER — Ambulatory Visit: Payer: PPO | Admitting: Neurology

## 2021-03-25 DIAGNOSIS — I5042 Chronic combined systolic (congestive) and diastolic (congestive) heart failure: Secondary | ICD-10-CM | POA: Diagnosis not present

## 2021-03-25 DIAGNOSIS — Z95810 Presence of automatic (implantable) cardiac defibrillator: Secondary | ICD-10-CM | POA: Diagnosis not present

## 2021-03-26 NOTE — Progress Notes (Signed)
EPIC Encounter for ICM Monitoring  Patient Name: Berlin Filipiak is a 65 y.o. male Date: 03/26/2021 Primary Care Physican: Birdie Sons, MD Primary Cardiologist: [name] Electrophysiologist: [name] 09/03/2020 Office Weight: 171 lbs                                                             Transmission reviewed.   Optivol thoracic impedance suggesting normal fluid levels.     Prescribed: Bumetanide 2 mg take 1 tablet daily (per 8/17 epic kidney note, patient takes every 3-4 days for edema)   Labs: 02/24/2021 Creatinine 3.44, BUN 36, Potassium 5.2, Sodium 144, GFR 19 11/11/2020 Creatinine 4.84, BUN 48, Potassium 3.8, Sodium 142, GFR 13  A complete set of results can be found in Results Review.   Recommendations:  No changes   Follow-up plan: ICM clinic phone appointment on 05/05/2021.   91 day device clinic remote transmission 06/06/2021.     EP/Cardiology Office Visits:  Recall 07/19/2021 with Dr. [name].     Copy of ICM check sent to Dr. [name].   3 month ICM trend: 03/24/2021.    1 Year ICM trend:       Rosalene Billings, RN 03/26/2021 8:12 AM

## 2021-03-31 ENCOUNTER — Telehealth: Payer: Self-pay

## 2021-03-31 DIAGNOSIS — M109 Gout, unspecified: Secondary | ICD-10-CM

## 2021-03-31 DIAGNOSIS — R110 Nausea: Secondary | ICD-10-CM

## 2021-03-31 MED ORDER — PROMETHAZINE HCL 25 MG PO TABS: 25.0000 mg | ORAL_TABLET | Freq: Three times a day (TID) | ORAL | 0 refills | Status: DC | PRN

## 2021-03-31 MED ORDER — PREDNISONE 10 MG PO TABS: ORAL_TABLET | ORAL | 1 refills | Status: DC

## 2021-03-31 NOTE — Telephone Encounter (Signed)
Mr. Betzer reports that Zofran does not help with his nausea and would like to try promethazine. ([facility])    Pt states he had prednisone on hand from a previous prescription.  He took one a day for three days and felt better, but once he ran out he started feeling bad.   He also mention a change in his bowels.  He describes "sticky discharge"  (Mucus?).  He denies bloody black tarry stool.    He tried calling GI today to get scheduled but was on hold for over 25 minutes before having to hand up.  He will try again later today or tomorrow.   Please advise.   Thanks,   -Mickel Baas

## 2021-03-31 NOTE — Telephone Encounter (Signed)
Copied from [place] [id number]. Topic: General - Other >> Mar 31, 2021 12:20 PM Celene Kras wrote: Reason for CRM: Pt called and is requesting to speak with PCP nurse. He states that he has started to develop other symptoms and is requesting to have advice. Pt ws not comfortable discussing what other symptoms he was having. He also states that he is needing to have a change to the nausea medication as it is not helping him at all. He states that he did try prednisone that he took, but after he stopped taking those he began to feel bad again. Please advise.

## 2021-04-02 ENCOUNTER — Encounter: Payer: Self-pay | Admitting: Internal Medicine

## 2021-04-09 ENCOUNTER — Other Ambulatory Visit: Payer: Self-pay | Admitting: Family Medicine

## 2021-04-09 DIAGNOSIS — M5489 Other dorsalgia: Secondary | ICD-10-CM

## 2021-04-09 NOTE — Telephone Encounter (Signed)
Requested medication (s) are due for refill today: yes  Requested medication (s) are on the active medication list: yes  Last refill:  03/05/21 #60  Future visit scheduled: no  Notes to clinic:  Please review for refill. Refill not delegated per protocol.     Requested Prescriptions  Pending Prescriptions Disp Refills   HYDROcodone-acetaminophen (NORCO) 10-325 MG tablet 60 tablet 0    Sig: Take 1 tablet by mouth every 8 (eight) hours as needed.     Not Delegated - Analgesics:  Opioid Agonist Combinations Failed - 04/09/2021 11:50 AM      Failed - This refill cannot be delegated      Failed - Urine Drug Screen completed in last 360 days      Passed - Valid encounter within last 6 months    Recent Outpatient Visits           1 month ago Primary insomnia   [facility] Birdie Sons, MD   6 months ago No-show for appointment   [place], FNP   9 months ago Depression, major, single episode, moderate [facility])   [facility] Birdie Sons, MD   12 months ago Syncope, unspecified syncope type   [place], Wendee Beavers, [place]   1 year ago Insomnia, unspecified type   [facility] Birdie Sons, MD       Future Appointments             In 1 month Ralene Bathe, MD [place]

## 2021-04-09 NOTE — Telephone Encounter (Signed)
Medication: HYDROcodone-acetaminophen (NORCO) 10-325 MG tablet  Has the pt contacted their pharmacy? no [id number] [place] Please be advised refills may take up to 3 business days.  We ask that you follow up with your pharmacy.

## 2021-04-10 MED ORDER — HYDROCODONE-ACETAMINOPHEN 10-325 MG PO TABS: 1.0000 | ORAL_TABLET | Freq: Three times a day (TID) | ORAL | 0 refills | Status: DC | PRN

## 2021-04-11 ENCOUNTER — Encounter: Payer: Self-pay | Admitting: Internal Medicine

## 2021-04-11 ENCOUNTER — Ambulatory Visit: Payer: PPO | Admitting: Internal Medicine

## 2021-04-11 ENCOUNTER — Other Ambulatory Visit (INDEPENDENT_AMBULATORY_CARE_PROVIDER_SITE_OTHER): Payer: PPO

## 2021-04-11 VITALS — BP 110/70 | HR 94 | Ht 70.0 in | Wt 178.5 lb

## 2021-04-11 DIAGNOSIS — K5900 Constipation, unspecified: Secondary | ICD-10-CM | POA: Diagnosis not present

## 2021-04-11 DIAGNOSIS — R109 Unspecified abdominal pain: Secondary | ICD-10-CM

## 2021-04-11 DIAGNOSIS — R110 Nausea: Secondary | ICD-10-CM

## 2021-04-11 LAB — CORTISOL: Cortisol, Plasma: 14 ug/dL

## 2021-04-11 MED ORDER — SUCRALFATE 1 GM/10ML PO SUSP: 1.0000 g | Freq: Four times a day (QID) | ORAL | 1 refills | Status: DC

## 2021-04-11 MED ORDER — OMEPRAZOLE 40 MG PO CPDR: 40.0000 mg | DELAYED_RELEASE_CAPSULE | Freq: Two times a day (BID) | ORAL | 3 refills | Status: DC
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
## 2021-04-11 NOTE — Patient Instructions (Signed)
If you are age 64 or younger, your body mass index should be between 19-25. Your Body mass index is 25.61 kg/m. If this is out of the aformentioned range listed, please consider follow up with your Primary Care Provider.   __________________________________________________________  The [place] [facility] providers would like to encourage you to use [facility] to communicate with providers for non-urgent requests or questions.  Due to long hold times on the telephone, sending your provider a message by [facility] may be a faster and more efficient way to get a response.  Please allow 48 business hours for a response.  Please remember that this is for non-urgent requests.   You have been scheduled for a gastric emptying scan at [facility] on 04/24/21 at 7:30am. Please arrive at least 30 minutes prior to your appointment for registration. Please make certain not to have anything to eat or drink after midnight the night before your test. Hold all stomach medications (ex: Zofran, phenergan, Reglan) 48 hours prior to your test. If you need to reschedule your appointment, please contact radiology scheduling at [phone]. _____________________________________________________________________ A gastric-emptying study measures how long it takes for food to move through your stomach. There are several ways to measure stomach emptying. In the most common test, you eat food that contains a small amount of radioactive material. A scanner that detects the movement of the radioactive material is placed over your abdomen to monitor the rate at which food leaves your stomach. This test normally takes about 4 hours to complete. _____________________________________________________________________  Your provider has requested that you go to the basement level for lab work before leaving today. Press "B" on the elevator. The lab is located at the first door on the left as you exit the elevator.  Due to recent changes in  healthcare laws, you may see the results of your imaging and laboratory studies on MyChart before your provider has had a chance to review them.  We understand that in some cases there may be results that are confusing or concerning to you. Not all laboratory results come back in the same time frame and the provider may be waiting for multiple results in order to interpret others.  Please give [place] 48 hours in order for your provider to thoroughly review all the results before contacting the office for clarification of your results.   We have sent the following medications to your pharmacy for you to pick up at your convenience:  START: omeprazole '40mg'$  one capsule twice daily 20 to 30 minutes before breakfast and dinner meals each day.  START: Carafate suspension 1g four times daily.  You are scheduled to follow up on 06-10-2021 at 1:30pm.  Thank you for entrusting me with your care and choosing [facility].  Dr Lorenso Courier

## 2021-04-12 NOTE — Progress Notes (Signed)
Serum cortisol was normal

## 2021-04-16 ENCOUNTER — Other Ambulatory Visit: Payer: Self-pay | Admitting: Family Medicine

## 2021-04-16 NOTE — Telephone Encounter (Signed)
Requested medication (s) are due for refill today - no  Requested medication (s) are on the active medication list -no  Future visit scheduled -no  Last refill: 03/13/20  Notes to clinic: Request RF: medication not on current medication list  Requested Prescriptions  Pending Prescriptions Disp Refills   furosemide (LASIX) 40 MG tablet [Pharmacy Med Name: FUROSEMIDE 40 MG TABLET] 90 tablet 4    Sig: TAKE 1 TABLET (40 MG TOTAL) BY MOUTH DAILY AS NEEDED FOR FLUID.     Cardiovascular:  Diuretics - Loop Failed - 04/16/2021 12:02 PM      Failed - Cr in normal range and within 360 days    Creat  Date Value Ref Range Status  04/07/2017 3.47 (H) 0.70 - 1.25 mg/dL Final    Comment:    For patients >27 years of age, the reference limit for Creatinine is approximately 13% higher for people identified as African-American. .    Creatinine, Ser  Date Value Ref Range Status  02/24/2021 3.44 (H) 0.76 - 1.27 mg/dL Final   Creatinine, Urine  Date Value Ref Range Status  05/13/2016 29.78 mg/dL Final          Passed - K in normal range and within 360 days    Potassium  Date Value Ref Range Status  02/24/2021 5.2 3.5 - 5.2 mmol/L Final          Passed - Ca in normal range and within 360 days    Calcium  Date Value Ref Range Status  02/24/2021 9.9 8.6 - 10.2 mg/dL Final   Calcium, Ion  Date Value Ref Range Status  02/28/2014 1.20 1.12 - 1.23 mmol/L Final          Passed - Na in normal range and within 360 days    Sodium  Date Value Ref Range Status  02/24/2021 144 134 - 144 mmol/L Final          Passed - Last BP in normal range    BP Readings from Last 1 Encounters:  04/11/21 110/70          Passed - Valid encounter within last 6 months    Recent Outpatient Visits           1 month ago Primary insomnia   [facility] Birdie Sons, MD   7 months ago No-show for appointment   [place], FNP   10 months ago  Depression, major, single episode, moderate [facility])   [facility] Birdie Sons, MD   1 year ago Syncope, unspecified syncope type   [place], Wendee Beavers, PA-C   1 year ago Insomnia, unspecified type   [facility] Birdie Sons, MD       Future Appointments             In 3 weeks Ralene Bathe, MD [place]               Requested Prescriptions  Pending Prescriptions Disp Refills   furosemide (LASIX) 40 MG tablet [Pharmacy Med Name: FUROSEMIDE 40 MG TABLET] 90 tablet 4    Sig: TAKE 1 TABLET (40 MG TOTAL) BY MOUTH DAILY AS NEEDED FOR FLUID.     Cardiovascular:  Diuretics - Loop Failed - 04/16/2021 12:02 PM      Failed - Cr in normal range and within 360 days    Creat  Date Value Ref Range Status  04/07/2017 3.47 (H)  0.70 - 1.25 mg/dL Final    Comment:    For patients >10 years of age, the reference limit for Creatinine is approximately 13% higher for people identified as African-American. .    Creatinine, Ser  Date Value Ref Range Status  02/24/2021 3.44 (H) 0.76 - 1.27 mg/dL Final   Creatinine, Urine  Date Value Ref Range Status  05/13/2016 29.78 mg/dL Final          Passed - K in normal range and within 360 days    Potassium  Date Value Ref Range Status  02/24/2021 5.2 3.5 - 5.2 mmol/L Final          Passed - Ca in normal range and within 360 days    Calcium  Date Value Ref Range Status  02/24/2021 9.9 8.6 - 10.2 mg/dL Final   Calcium, Ion  Date Value Ref Range Status  02/28/2014 1.20 1.12 - 1.23 mmol/L Final          Passed - Na in normal range and within 360 days    Sodium  Date Value Ref Range Status  02/24/2021 144 134 - 144 mmol/L Final          Passed - Last BP in normal range    BP Readings from Last 1 Encounters:  04/11/21 110/70          Passed - Valid encounter within last 6 months    Recent Outpatient Visits           1 month ago Primary insomnia    [facility] Birdie Sons, MD   7 months ago No-show for appointment   [place], FNP   10 months ago Depression, major, single episode, moderate [facility])   [facility] Birdie Sons, MD   1 year ago Syncope, unspecified syncope type   [place], Wendee Beavers, PA-C   1 year ago Insomnia, unspecified type   [facility] Birdie Sons, MD       Future Appointments             In 3 weeks Ralene Bathe, MD [place]

## 2021-04-18 ENCOUNTER — Telehealth: Payer: Self-pay | Admitting: Internal Medicine

## 2021-04-18 DIAGNOSIS — K219 Gastro-esophageal reflux disease without esophagitis: Secondary | ICD-10-CM

## 2021-04-18 MED ORDER — SUCRALFATE 1 G PO TABS: 1.0000 g | ORAL_TABLET | Freq: Four times a day (QID) | ORAL | 1 refills | Status: DC | PRN

## 2021-04-18 NOTE — Telephone Encounter (Signed)
Inbound call from patient. States medication given, Omeprazole '40mg'$ , is not working for him. Also states he could not afford Carafate. Best contact number [phone]

## 2021-04-18 NOTE — Telephone Encounter (Signed)
Please review. I don't see this on his med list.   Thanks,   -Mickel Baas

## 2021-04-18 NOTE — Telephone Encounter (Signed)
Called the patient, reviewed symptoms with him.  He has been taking omeprazole for the past week without much relief.  Was given Carafate liquid suspension however insurance will cover this.  Discussed options with him.  Is currently Friday evening.  He is scheduled for a gastric emptying study next week.  I will switch his Carafate suspension to Carafate tablets which are typically much cheaper and this should be covered.  He can take the tablet form or he can make a slurry and dissolve it, I discussed with him how to do that.  He is agreeable and I put that to the pharmacy for him this evening.  Hopefully this will help him.  If not and symptoms persist or worsen he should call us back.  Charlsie Quest

## 2021-04-18 NOTE — Telephone Encounter (Signed)
Returned call to patient.  He states he is taking omeprazole '40mg'$  BID with no relief.  He states he is "sick on his stomach with a burning sensation that awakens him in the night.  Stomach seems to calm down after eating a meal, but a few hours after eating he becomes nauseated."  Patient was not able to afford Carafate suspension.  With insurance he states medicine was $150.00.  Dr Lorenso Courier note from 04-11-2021    ASSESSMENT AND PLAN: Nausea Abdominal pain Constipation Not entirely clear what is causing his episodic symptoms at this time, though could consider GERD, PUD, or gastroparesis. I feel inclined to try medical therapy first due to his cardiac history and then move to EGD if symptoms do not improve. Opioid use could be playing a role in slowing down his gut motility in general as the patient describes some issues with constipation and has issues with abdominal fullness. An alternative cause for gastroparesis could include the cardiac ablations that he has had in the past. Patient has been getting cologuards for colon cancer screening. - Check AM cortisol - PPI BID - Trial of carafate 1 g suspension QID - GES - RTC 2 months - If still symptomatic at follow up visit, then plan for EGD in the hospital in 07/2021 (based upon EF of 30-35% cannot be done in [place]). Could theoretically consider adding on colonoscopy at the same time if patient is interested in switching from stool tests to colonoscopies. - Consider daily Miralax in the future and discussion about opioid cessation.  Please advise as you are the DOD.

## 2021-04-24 ENCOUNTER — Other Ambulatory Visit: Payer: Self-pay

## 2021-04-24 ENCOUNTER — Encounter (HOSPITAL_COMMUNITY)
Admission: RE | Admit: 2021-04-24 | Discharge: 2021-04-24 | Disposition: A | Discharge status: Home / Self Care | Payer: PPO | Source: Ambulatory Visit | Attending: Internal Medicine | Admitting: Internal Medicine

## 2021-04-24 DIAGNOSIS — R110 Nausea: Secondary | ICD-10-CM | POA: Diagnosis not present

## 2021-04-24 DIAGNOSIS — R109 Unspecified abdominal pain: Secondary | ICD-10-CM | POA: Insufficient documentation

## 2021-04-24 DIAGNOSIS — R6881 Early satiety: Secondary | ICD-10-CM | POA: Diagnosis not present

## 2021-04-24 DIAGNOSIS — R112 Nausea with vomiting, unspecified: Secondary | ICD-10-CM | POA: Diagnosis not present

## 2021-04-24 MED ORDER — TECHNETIUM TC 99M SULFUR COLLOID
2.1000 | Freq: Once | INTRAVENOUS | Status: AC
  Administered 2021-04-24: 2.1 via INTRAVENOUS

## 2021-04-24 NOTE — Progress Notes (Signed)
Hi Ammie, please call the patient and let him know that his gastric emptying study was normal. Thanks!

## 2021-04-26 ENCOUNTER — Encounter (HOSPITAL_COMMUNITY): Payer: Self-pay | Admitting: Emergency Medicine

## 2021-04-26 ENCOUNTER — Other Ambulatory Visit: Payer: Self-pay

## 2021-04-26 ENCOUNTER — Emergency Department (HOSPITAL_COMMUNITY)
Admission: EM | Admit: 2021-04-26 | Discharge: 2021-04-26 | Disposition: A | Discharge status: Home / Self Care | Payer: PPO | Attending: Emergency Medicine | Admitting: Emergency Medicine

## 2021-04-26 DIAGNOSIS — Z5321 Procedure and treatment not carried out due to patient leaving prior to being seen by health care provider: Secondary | ICD-10-CM | POA: Insufficient documentation

## 2021-04-26 DIAGNOSIS — M79605 Pain in left leg: Secondary | ICD-10-CM | POA: Diagnosis not present

## 2021-04-26 DIAGNOSIS — L538 Other specified erythematous conditions: Secondary | ICD-10-CM | POA: Insufficient documentation

## 2021-04-26 DIAGNOSIS — R509 Fever, unspecified: Secondary | ICD-10-CM | POA: Insufficient documentation

## 2021-04-26 LAB — CBC WITH DIFFERENTIAL/PLATELET
Abs Immature Granulocytes: 0.08 10*3/uL — ABNORMAL HIGH (ref 0.00–0.07)
Basophils Absolute: 0 10*3/uL (ref 0.0–0.1)
Basophils Relative: 0 %
Eosinophils Absolute: 0 10*3/uL (ref 0.0–0.5)
Eosinophils Relative: 0 %
HCT: 51.4 % (ref 39.0–52.0)
Hemoglobin: 16.8 g/dL (ref 13.0–17.0)
Immature Granulocytes: 0 %
Lymphocytes Relative: 2 %
Lymphs Abs: 0.4 10*3/uL — ABNORMAL LOW (ref 0.7–4.0)
MCH: 32.4 pg (ref 26.0–34.0)
MCHC: 32.7 g/dL (ref 30.0–36.0)
MCV: 99.2 fL (ref 80.0–100.0)
Monocytes Absolute: 0.9 10*3/uL (ref 0.1–1.0)
Monocytes Relative: 5 %
Neutro Abs: 16.8 10*3/uL — ABNORMAL HIGH (ref 1.7–7.7)
Neutrophils Relative %: 93 %
Platelets: 132 10*3/uL — ABNORMAL LOW (ref 150–400)
RBC: 5.18 MIL/uL (ref 4.22–5.81)
RDW: 12.5 % (ref 11.5–15.5)
WBC: 18.2 10*3/uL — ABNORMAL HIGH (ref 4.0–10.5)
nRBC: 0 % (ref 0.0–0.2)

## 2021-04-26 LAB — COMPREHENSIVE METABOLIC PANEL
ALT: 15 U/L (ref 0–44)
AST: 25 U/L (ref 15–41)
Albumin: 3.9 g/dL (ref 3.5–5.0)
Alkaline Phosphatase: 96 U/L (ref 38–126)
Anion gap: 13 (ref 5–15)
BUN: 43 mg/dL — ABNORMAL HIGH (ref 8–23)
CO2: 22 mmol/L (ref 22–32)
Calcium: 9.3 mg/dL (ref 8.9–10.3)
Chloride: 104 mmol/L (ref 98–111)
Creatinine, Ser: 3.34 mg/dL — ABNORMAL HIGH (ref 0.61–1.24)
GFR, Estimated: 20 mL/min — ABNORMAL LOW (ref >60)
Glucose, Bld: 86 mg/dL (ref 70–99)
Potassium: 4.2 mmol/L (ref 3.5–5.1)
Sodium: 139 mmol/L (ref 135–145)
Total Bilirubin: 1.5 mg/dL — ABNORMAL HIGH (ref 0.3–1.2)
Total Protein: 7 g/dL (ref 6.5–8.1)

## 2021-04-26 LAB — LACTIC ACID, PLASMA: Lactic Acid, Venous: 2.7 mmol/L (ref 0.5–1.9)

## 2021-04-26 MED ORDER — ACETAMINOPHEN 325 MG PO TABS
650.0000 mg | ORAL_TABLET | Freq: Once | ORAL | Status: AC
  Administered 2021-04-26: 650 mg via ORAL
  Filled 2021-04-26: qty 2

## 2021-04-26 NOTE — ED Notes (Signed)
Lactic 2.7.  Dr. Vanita Panda notified.

## 2021-04-26 NOTE — ED Provider Notes (Signed)
Emergency Medicine Provider Triage Evaluation Note  Richard Cook , a 65 y.o. male  was evaluated in triage.  Pt complains of left leg infection.  Patient states he noticed this first yesterday while hiking the [place] trail.  Sensation intact, no drainage noted.  History of leg injury, no history of diabetes.  Is febrile in triage.  Review of Systems  Positive: Fevers, chills, left leg pain Negative: Nausea, vomiting  Physical Exam  BP (!) 154/100 (BP Location: Left Arm)   Pulse (!) 119   Temp (!) 101 F (38.3 C)   Resp 16   SpO2 98%  Gen:   Awake, no distress   Resp:  Normal effort  MSK:   Moves extremities without difficulty  Other:  Distal pulses intact, erythema thematous wound noted to lower leg with several surgical scars, no purulence noted  Medical Decision Making  Medically screening exam initiated at 4:38 PM.  Appropriate orders placed.  Richard Cook was informed that the remainder of the evaluation will be completed by another provider, this initial triage assessment does not replace that evaluation, and the importance of remaining in the ED until their evaluation is complete.    Nestor Lewandowsky 04/26/21 1641    Carmin Muskrat, MD 04/26/21 [id number]

## 2021-04-26 NOTE — ED Notes (Addendum)
Pt states that leg cleared up after medications, and was leaving.

## 2021-04-26 NOTE — ED Triage Notes (Signed)
Pt c/o redness to left leg, c/o fever and chills starting today. Hx cellulitis.

## 2021-04-28 ENCOUNTER — Ambulatory Visit: Payer: Self-pay | Admitting: *Deleted

## 2021-04-28 DIAGNOSIS — L03119 Cellulitis of unspecified part of limb: Secondary | ICD-10-CM

## 2021-04-28 MED ORDER — CEPHALEXIN 500 MG PO CAPS: 500.0000 mg | ORAL_CAPSULE | Freq: Four times a day (QID) | ORAL | 0 refills | Status: AC

## 2021-04-28 NOTE — Telephone Encounter (Signed)
Pt advised.   Thanks,   -Carleena Mires  

## 2021-04-28 NOTE — Addendum Note (Signed)
Addended by: Birdie Sons on: 04/28/2021 04:55 PM   Modules accepted: Orders

## 2021-04-28 NOTE — Telephone Encounter (Signed)
Summary: leg inflammed red, not swelling   Patient called asking for cream to be sent in that he had before for redness on his leg that's inflamed. Please call back      Reason for Disposition  [1] Looks infected (spreading redness, pus) AND [2] no fever  Answer Assessment - Initial Assessment Questions 1. APPEARANCE of RASH: "Describe the rash."      Redness- lower leg- inside 2. LOCATION: "Where is the rash located?"      Left leg 3. NUMBER: "How many spots are there?"      Starting to break up- one warm area- lower calf 4. SIZE: "How big are the spots?" (Inches, centimeters or compare to size of a coin)      12'x4" 5. ONSET: "When did the rash start?"      Saturday 12 6. ITCHING: "Does the rash itch?" If Yes, ask: "How bad is the itch?"  (Scale 0-10; or none, mild, moderate, severe)     no 7. PAIN: "Does the rash hurt?" If Yes, ask: "How bad is the pain?"  (Scale 0-10; or none, mild, moderate, severe)    - NONE (0): no pain    - MILD (1-3): doesn't interfere with normal activities     - MODERATE (4-7): interferes with normal activities or awakens from sleep     - SEVERE (8-10): excruciating pain, unable to do any normal activities     no 8. OTHER SYMPTOMS: "Do you have any other symptoms?" (e.g., fever)     Did have fever- no fever since Saturday night 9. PREGNANCY: "Is there any chance you are pregnant?" "When was your last menstrual period?"     N/a  Protocols used: Rash or Redness - Localized-A-AH

## 2021-04-28 NOTE — Telephone Encounter (Signed)
Have sent prescription for cephalexin to [facility]. Call if not improving in 3-4 days or go to ER if it gets any worse.

## 2021-04-28 NOTE — Telephone Encounter (Signed)
Patient is calling to report he has a red inflamed area on lower left leg. Patient states he has had this before- cellulitis of lower leg and PCP gave him antibiotic cream to apply. Patient is requesting Rx. Patient states he started Saturday with chills and then developed a fever. Patient had Prednisone- so he started that and the fever has gone away- and the redness in breaking up- but seems to be at stall in healing- patient is requesting antibiotic cream. Patient advised PCP may want to check his leg- but patient states it is not anything he has not seen before and he does not want to waste his time. Patient advised he still might have to be seen because it has been so long since last flare.

## 2021-04-29 ENCOUNTER — Telehealth: Payer: Self-pay

## 2021-04-29 NOTE — Telephone Encounter (Signed)
Prescription was sent yesterday and patient was advised by CMA Ashley Royalty at 5:04 pm

## 2021-04-29 NOTE — Telephone Encounter (Signed)
Copied from [place] [id number]. Topic: Quick Communication - Rx Refill/Question >> Apr 28, 2021  4:29 PM Pawlus, Brayton Layman A wrote: Pt called in earlier today, spoke with NT, and wants to know if the antibiotic cream for his leg is going to be sent in. Pt did not want to make an appt and wanted a call back as soon as possible.

## 2021-05-05 ENCOUNTER — Ambulatory Visit (INDEPENDENT_AMBULATORY_CARE_PROVIDER_SITE_OTHER): Payer: PPO

## 2021-05-05 DIAGNOSIS — Z95810 Presence of automatic (implantable) cardiac defibrillator: Secondary | ICD-10-CM

## 2021-05-05 DIAGNOSIS — I5042 Chronic combined systolic (congestive) and diastolic (congestive) heart failure: Secondary | ICD-10-CM

## 2021-05-08 NOTE — Telephone Encounter (Signed)
Returned pt call. States he is still having unresolved nausea w/o emesis and a burning sensation in the throat. Denies s/sx of dysphagia, or chest discomfort. Confirmed he is taking omeprazole BID as Rx'd. Does get slight relief at times from use of Carafate. States he stopped Keflex 1 week ago as his cellulitis had improved. Denies having any relief from use of either Zofran or Phenergan. States he has 1 soft stool per day. Denies drinking adequate fluids or having any kind of an appetite because his too sick to eat or drink. States the only medication providing any sort of relief is PeptoBismol. Most recent labs and GES appear unremarkable. Given his symptoms, advised I will forward this note to Dr. Lorenso Courier for further recommendations and to determine if she would like to proceed with scheduling EGD/[facility]. Verbalized acceptance and understanding.

## 2021-05-08 NOTE — Telephone Encounter (Signed)
Patient called states he is still really sick and is seeking further advise.

## 2021-05-09 ENCOUNTER — Other Ambulatory Visit: Payer: Self-pay

## 2021-05-09 ENCOUNTER — Ambulatory Visit (INDEPENDENT_AMBULATORY_CARE_PROVIDER_SITE_OTHER): Payer: PPO

## 2021-05-09 DIAGNOSIS — I255 Ischemic cardiomyopathy: Secondary | ICD-10-CM | POA: Diagnosis not present

## 2021-05-09 DIAGNOSIS — R109 Unspecified abdominal pain: Secondary | ICD-10-CM

## 2021-05-09 DIAGNOSIS — R110 Nausea: Secondary | ICD-10-CM

## 2021-05-09 LAB — CUP PACEART REMOTE DEVICE CHECK
Battery Remaining Longevity: 42 mo
Battery Voltage: 2.97 V
Brady Statistic RV Percent Paced: 0.01 %
Date Time Interrogation Session: 20221006174132
HighPow Impedance: 83 Ohm
Implantable Lead Implant Date: 20150318
Implantable Lead Location: 753860
Implantable Lead Model: 181
Implantable Lead Serial Number: 327195
Implantable Pulse Generator Implant Date: 20150318
Lead Channel Impedance Value: 665 Ohm
Lead Channel Impedance Value: 665 Ohm
Lead Channel Pacing Threshold Amplitude: 1 V
Lead Channel Pacing Threshold Pulse Width: 0.4 ms
Lead Channel Sensing Intrinsic Amplitude: 8 mV
Lead Channel Sensing Intrinsic Amplitude: 8 mV
Lead Channel Setting Pacing Amplitude: 2 V
Lead Channel Setting Pacing Pulse Width: 0.4 ms
Lead Channel Setting Sensing Sensitivity: 0.3 mV

## 2021-05-09 NOTE — Telephone Encounter (Signed)
Called pt and advised he has been scheduled for EGD at [facility] on 07/10/21 @ 1030am, arrival 9am. Prep instructions have been mailed to his home address. Advised we are still awaiting response from Dr. Juleen China re: additional testing. No further recommendations at this time. Verbalized acceptance and understanding. Amb ref placed for auth purposes.

## 2021-05-09 NOTE — Progress Notes (Signed)
EPIC Encounter for ICM Monitoring  Patient Name: Richard Cook is a 65 y.o. 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   Optivol thoracic impedance suggesting normal fluid levels.  Message sent to device clinic triage to review 10/5 report for SVT and VTNS   Prescribed: Bumetanide 2 mg take 1 tablet daily (per 8/17 epic kidney note, patient takes every 3-4 days for edema)   Labs: 02/24/2021 Creatinine 3.44, BUN 36, Potassium 5.2, Sodium 144, GFR 19 11/11/2020 Creatinine 4.84, BUN 48, Potassium 3.8, Sodium 142, GFR 13  A complete set of results can be found in Results Review.   Recommendations:  No changes   Follow-up plan: ICM clinic phone appointment on 06/09/2021.   91 day device clinic remote transmission 06/06/2021.     EP/Cardiology Office Visits:  Recall 07/19/2021 with Dr. [name].     Copy of ICM check sent to Dr. [name].    3 month ICM trend: 05/07/2021.    1 Year ICM trend:       Rosalene Billings, RN 05/09/2021 9:28 AM

## 2021-05-09 NOTE — Telephone Encounter (Signed)
Pt scheduled as indicated below. Once response received from Dr. Juleen China, please advise with further orders, if needed.  Future Admission Information   Expected Admission Date & Time: 07/10/2021 10:30 AM     Pending Admission Date & Time:     PCP: Birdie Sons, MD  Admission Date & Time:     Admitting Provider: Sharyn Creamer, MD    Unit: [facility]-ENDOSCOPY Room & Bed:      Patient Class: [facility] Service: Surgery  Primary Admission Diagnosis: Nausea, abd pain

## 2021-05-09 NOTE — Progress Notes (Signed)
Message from device clinic triage.  Benedetto Goad, RN  Diondre Pulis Panda, RN Good Morning [name],  After doing a little digging, it appears most of the NSVT episodes occurred on 04/26/21. Patient presented to the ED on this day c/o leg infection. Also noted at that time he had been hiking the [place] trail prior to which would account for the SVT 1:1 tachycardia. We will continue to monitor since all can be explained and seemed to be situational. Thanks!

## 2021-05-09 NOTE — Telephone Encounter (Signed)
Called Endo Sched to inquire if Dr. Lorenso Courier could add pt BEFORE or AFTER 1pm case on 10/13. Dr. Ardis Hughs follows and will not allow ample time to schedule case either before or after on 10/13. Reviewed hosp availabilities for ALL providers. However, first availability is with Dr. Fuller Plan on 07/03/21 @ 8:30am. Routing this message to Dr. Lorenso Courier to determine how she would like to proceed given pt symptoms and lack of available hospital time for providers.

## 2021-05-12 ENCOUNTER — Other Ambulatory Visit: Payer: Self-pay

## 2021-05-12 DIAGNOSIS — R109 Unspecified abdominal pain: Secondary | ICD-10-CM

## 2021-05-12 NOTE — Telephone Encounter (Signed)
Orders placed for exam as requested. Sent message to [facility] to get pt scheduled for sometime this week. Awaiting their response. Called pt to inform about new orders and to inform about possibility of him getting a call from [facility] about this exam. LVM requesting returned call.

## 2021-05-13 ENCOUNTER — Telehealth: Payer: Self-pay | Admitting: Internal Medicine

## 2021-05-13 ENCOUNTER — Ambulatory Visit: Payer: PPO | Admitting: Dermatology

## 2021-05-13 DIAGNOSIS — N184 Chronic kidney disease, stage 4 (severe): Secondary | ICD-10-CM | POA: Diagnosis not present

## 2021-05-13 DIAGNOSIS — N2581 Secondary hyperparathyroidism of renal origin: Secondary | ICD-10-CM | POA: Diagnosis not present

## 2021-05-13 DIAGNOSIS — D631 Anemia in chronic kidney disease: Secondary | ICD-10-CM | POA: Diagnosis not present

## 2021-05-13 DIAGNOSIS — I129 Hypertensive chronic kidney disease with stage 1 through stage 4 chronic kidney disease, or unspecified chronic kidney disease: Secondary | ICD-10-CM | POA: Diagnosis not present

## 2021-05-13 NOTE — Telephone Encounter (Signed)
Received notification from [employer]. Advised me to provide pt with their contact # [phone] for scheduling purposes. Called pt and provided him with this # as well as rationale for exam. Verbalized acceptance and understanding.

## 2021-05-13 NOTE — Telephone Encounter (Signed)
Addressed in previously created encounter 

## 2021-05-13 NOTE — Telephone Encounter (Signed)
Inbound call from pt stating that he is returning a phone call. Thank you.

## 2021-05-13 NOTE — Telephone Encounter (Signed)
SECOND ATTEMPT:  Called pt to make him aware of new orders and scheduling process. LVM requesting returned call. Appears vascular is trying to reach pt as well in attempts to schedule exam. Will cont efforts to reach pt.

## 2021-05-14 NOTE — Telephone Encounter (Signed)
Appointment Information  Name: Demetrous, Guillette MRN: [id number]  Date: 05/16/2021 Status: Sch  Time: 9:00 AM Length: 60  Visit Type: MESENTERIC [id number] Copay: $0.00  Provider: [facility] US [facility] Department: [facility]  Referring Provider: Sharyn Creamer CSN: [id number]  Notes: Mesenteric Duplex must be NPO/abdominal pain/Ying Dorsey/no call report needed/NPR per Seara Hinesley/dsc  Made On: Change Notes: 05/13/2021 3:49 PM 05/13/2021 3:49 PM By: By: Venetia Night, Coolidge Breeze

## 2021-05-16 ENCOUNTER — Ambulatory Visit (HOSPITAL_COMMUNITY): Admission: RE | Admit: 2021-05-16 | Payer: PPO | Source: Ambulatory Visit

## 2021-05-16 ENCOUNTER — Emergency Department (HOSPITAL_COMMUNITY): Payer: PPO

## 2021-05-16 ENCOUNTER — Emergency Department (HOSPITAL_COMMUNITY)
Admission: EM | Admit: 2021-05-16 | Discharge: 2021-05-16 | Disposition: A | Discharge status: Home / Self Care | Payer: PPO | Attending: Emergency Medicine | Admitting: Emergency Medicine

## 2021-05-16 ENCOUNTER — Emergency Department (HOSPITAL_BASED_OUTPATIENT_CLINIC_OR_DEPARTMENT_OTHER): Payer: PPO

## 2021-05-16 ENCOUNTER — Other Ambulatory Visit: Payer: Self-pay

## 2021-05-16 DIAGNOSIS — I2510 Atherosclerotic heart disease of native coronary artery without angina pectoris: Secondary | ICD-10-CM | POA: Diagnosis not present

## 2021-05-16 DIAGNOSIS — R609 Edema, unspecified: Secondary | ICD-10-CM | POA: Insufficient documentation

## 2021-05-16 DIAGNOSIS — M7989 Other specified soft tissue disorders: Secondary | ICD-10-CM | POA: Diagnosis not present

## 2021-05-16 DIAGNOSIS — Z8616 Personal history of COVID-19: Secondary | ICD-10-CM | POA: Insufficient documentation

## 2021-05-16 DIAGNOSIS — Z79899 Other long term (current) drug therapy: Secondary | ICD-10-CM | POA: Diagnosis not present

## 2021-05-16 DIAGNOSIS — Z951 Presence of aortocoronary bypass graft: Secondary | ICD-10-CM | POA: Diagnosis not present

## 2021-05-16 DIAGNOSIS — L03116 Cellulitis of left lower limb: Secondary | ICD-10-CM | POA: Insufficient documentation

## 2021-05-16 DIAGNOSIS — Z20822 Contact with and (suspected) exposure to covid-19: Secondary | ICD-10-CM | POA: Insufficient documentation

## 2021-05-16 DIAGNOSIS — N183 Chronic kidney disease, stage 3 unspecified: Secondary | ICD-10-CM | POA: Diagnosis not present

## 2021-05-16 DIAGNOSIS — L03115 Cellulitis of right lower limb: Secondary | ICD-10-CM | POA: Diagnosis not present

## 2021-05-16 DIAGNOSIS — Z95810 Presence of automatic (implantable) cardiac defibrillator: Secondary | ICD-10-CM | POA: Diagnosis not present

## 2021-05-16 DIAGNOSIS — I130 Hypertensive heart and chronic kidney disease with heart failure and stage 1 through stage 4 chronic kidney disease, or unspecified chronic kidney disease: Secondary | ICD-10-CM | POA: Insufficient documentation

## 2021-05-16 DIAGNOSIS — I5042 Chronic combined systolic (congestive) and diastolic (congestive) heart failure: Secondary | ICD-10-CM | POA: Diagnosis not present

## 2021-05-16 DIAGNOSIS — L03114 Cellulitis of left upper limb: Secondary | ICD-10-CM | POA: Diagnosis not present

## 2021-05-16 DIAGNOSIS — R600 Localized edema: Secondary | ICD-10-CM | POA: Diagnosis not present

## 2021-05-16 DIAGNOSIS — E039 Hypothyroidism, unspecified: Secondary | ICD-10-CM | POA: Diagnosis not present

## 2021-05-16 DIAGNOSIS — L03119 Cellulitis of unspecified part of limb: Secondary | ICD-10-CM

## 2021-05-16 LAB — BASIC METABOLIC PANEL
Anion gap: 13 (ref 5–15)
BUN: 35 mg/dL — ABNORMAL HIGH (ref 8–23)
CO2: 19 mmol/L — ABNORMAL LOW (ref 22–32)
Calcium: 9 mg/dL (ref 8.9–10.3)
Chloride: 106 mmol/L (ref 98–111)
Creatinine, Ser: 3.33 mg/dL — ABNORMAL HIGH (ref 0.61–1.24)
GFR, Estimated: 20 mL/min — ABNORMAL LOW (ref >60)
Glucose, Bld: 113 mg/dL — ABNORMAL HIGH (ref 70–99)
Potassium: 3.5 mmol/L (ref 3.5–5.1)
Sodium: 138 mmol/L (ref 135–145)

## 2021-05-16 LAB — CBC WITH DIFFERENTIAL/PLATELET
Abs Immature Granulocytes: 0.07 10*3/uL (ref 0.00–0.07)
Basophils Absolute: 0 10*3/uL (ref 0.0–0.1)
Basophils Relative: 0 %
Eosinophils Absolute: 0.2 10*3/uL (ref 0.0–0.5)
Eosinophils Relative: 2 %
HCT: 46.9 % (ref 39.0–52.0)
Hemoglobin: 15.6 g/dL (ref 13.0–17.0)
Immature Granulocytes: 1 %
Lymphocytes Relative: 6 %
Lymphs Abs: 0.5 10*3/uL — ABNORMAL LOW (ref 0.7–4.0)
MCH: 32.4 pg (ref 26.0–34.0)
MCHC: 33.3 g/dL (ref 30.0–36.0)
MCV: 97.3 fL (ref 80.0–100.0)
Monocytes Absolute: 0.7 10*3/uL (ref 0.1–1.0)
Monocytes Relative: 7 %
Neutro Abs: 7.8 10*3/uL — ABNORMAL HIGH (ref 1.7–7.7)
Neutrophils Relative %: 84 %
Platelets: 195 10*3/uL (ref 150–400)
RBC: 4.82 MIL/uL (ref 4.22–5.81)
RDW: 12.2 % (ref 11.5–15.5)
WBC: 9.2 10*3/uL (ref 4.0–10.5)
nRBC: 0 % (ref 0.0–0.2)

## 2021-05-16 LAB — URINALYSIS, ROUTINE W REFLEX MICROSCOPIC
Bacteria, UA: NONE SEEN
Bilirubin Urine: NEGATIVE
Glucose, UA: NEGATIVE mg/dL
Ketones, ur: NEGATIVE mg/dL
Leukocytes,Ua: NEGATIVE
Nitrite: NEGATIVE
Protein, ur: NEGATIVE mg/dL
Specific Gravity, Urine: 1.005 (ref 1.005–1.030)
pH: 5 (ref 5.0–8.0)

## 2021-05-16 LAB — RESP PANEL BY RT-PCR (FLU A&B, COVID) ARPGX2
Influenza A by PCR: NEGATIVE
Influenza B by PCR: NEGATIVE
SARS Coronavirus 2 by RT PCR: NEGATIVE

## 2021-05-16 LAB — LACTIC ACID, PLASMA: Lactic Acid, Venous: 1.6 mmol/L (ref 0.5–1.9)

## 2021-05-16 LAB — TROPONIN I (HIGH SENSITIVITY)
Troponin I (High Sensitivity): 30 ng/L — ABNORMAL HIGH (ref <18)
Troponin I (High Sensitivity): 33 ng/L — ABNORMAL HIGH (ref <18)

## 2021-05-16 LAB — BRAIN NATRIURETIC PEPTIDE: B Natriuretic Peptide: 375.6 pg/mL — ABNORMAL HIGH (ref 0.0–100.0)

## 2021-05-16 MED ORDER — FUROSEMIDE 10 MG/ML IJ SOLN
40.0000 mg | Freq: Once | INTRAMUSCULAR | Status: AC
  Administered 2021-05-16: 40 mg via INTRAVENOUS
  Filled 2021-05-16: qty 4

## 2021-05-16 MED ORDER — DEXTROSE 5 % IV SOLN
1125.0000 mg | Freq: Once | INTRAVENOUS | Status: AC
  Administered 2021-05-16: 1125 mg via INTRAVENOUS
  Filled 2021-05-16: qty 56.25

## 2021-05-16 MED ORDER — ACETAMINOPHEN 500 MG PO TABS
1000.0000 mg | ORAL_TABLET | Freq: Once | ORAL | Status: AC
  Administered 2021-05-16: 1000 mg via ORAL
  Filled 2021-05-16: qty 2

## 2021-05-16 MED ORDER — SODIUM CHLORIDE 0.9 % IV SOLN
12.5000 mg | Freq: Four times a day (QID) | INTRAVENOUS | Status: DC | PRN
  Administered 2021-05-16: 12.5 mg via INTRAVENOUS
  Filled 2021-05-16: qty 12.5
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
## 2021-05-16 NOTE — Discharge Instructions (Addendum)
Please follow up with your PCP regarding ED visit today. We have given you a one time dose of IV antibiotics that do not require additional outpatient oral antibiotics.   Continue taking your fluid pills as directed for swelling.   Return to the ED for any new/worsening symptoms

## 2021-05-16 NOTE — Progress Notes (Signed)
Lower extremity venous bilateral study completed.  Preliminary results relayed to [place], [place].  See CV Proc for preliminary results report.   Darlin Coco, RDMS, RVT

## 2021-05-16 NOTE — Progress Notes (Signed)
Pharmacy Note:  Dalbavancin for Acute Bacterial Skin and Skin Structure Infection (ABSSSI) Patients to [facility] Discharge Richard Cook is an 65 y.o. male who presented to [facility] on 05/16/2021 with an Acute Bacterial Skin and Skin Structure Infection  Inclusion criteria - Indication '[]'$  Moderately large skin lesion (>=75 cm2 or larger - about the size of a baseball) '[x]'$  Cellulitis  Inclusion Criteria - at least one SIRS criteria present '[]'$  WBC > 12,000 or < 4000 '[]'$  temp >100.9 or < 96.8 '[x]'$  heart rate >90'[x]'$  respiratory rate >20  Patient was evaluated for the following exclusion criteria and no exclusions were found  Hardware involvement, Hypotension / shock, Elevated lactate (>2) without other explanation, ram-negative infection risk factors (bites, water exposure, infection after trauma, infection after skin graft, neutropenia, burns, severe immunocompromise), necrotizing fasciitis possible or confirmed, Known or suspected osteomyelitis or septic arthritis, endocarditis, diabetic foot infection, ischemic ulcers, post-operative wound infection, perirectal infections, need for drainage in the operating room, hand or facial infections, injection drug users with a fever, bacteremia, pregnancy or breastfeeding, allergy to related antibiotics like vancomycin, known liver disease (t.bili >2x ULN or AST/ALT 3x ULN)  Joetta Manners, PharmD, [facility] Emergency Medicine Clinical Pharmacist ED RPh Phone: [phone] Main RX: [phone]

## 2021-05-16 NOTE — ED Notes (Signed)
Patient transported to Ultrasound 

## 2021-05-16 NOTE — ED Triage Notes (Addendum)
Pt c/o leg swelling that's been ongoing since Monday. Pt endorses cardiac hx. Pt endorses having a defibrillator. Pt states swelling has been increasing and accompanied with pain and weakness.Pt has no c/o SOB or chest pain at this time.

## 2021-05-16 NOTE — ED Provider Notes (Signed)
Emergency Medicine Provider Triage Evaluation Note  Richard Cook , a 65 y.o. male  was evaluated in triage.  Pt complains of LE swelling.  States lower legs and ankles are swollen about twice as big as normal causing his legs to hurt badly.  He has stage IV CKD and is followed by nephrology.  Reports nephrology and cardiology were concerned about fluid buildup around his heart.  Denies SOB at present.  Review of Systems  Positive: Leg swelling Negative: SOB  Physical Exam  BP (!) 120/93 (BP Location: Right Arm)   Pulse (!) 112   Temp (!) 103 F (39.4 C) (Oral)   SpO2 96%   Gen:   Awake, no distress   Resp:  Normal effort  MSK:   Moves extremities without difficulty  Other:  1+ edema lower extremities/ankles  Medical Decision Making  Medically screening exam initiated at 2:40 AM.  Appropriate orders placed.  Musab Chesley Noon was informed that the remainder of the evaluation will be completed by another provider, this initial triage assessment does not replace that evaluation, and the importance of remaining in the ED until their evaluation is complete.  LE edema.  Reports specialists were concerned for CHF.  Also found to be febrile to 103F, tachy to 112.  BP stable.  EKG, labs, CXR, covid screen.  Tylenol for fever.   Larene Pickett, PA-C 05/16/21 0245    Veryl Speak, MD 05/16/21 [phone]

## 2021-05-19 NOTE — Progress Notes (Signed)
Remote ICD transmission.   

## 2021-05-21 ENCOUNTER — Other Ambulatory Visit: Payer: Self-pay

## 2021-05-21 ENCOUNTER — Ambulatory Visit (HOSPITAL_COMMUNITY)
Admission: RE | Admit: 2021-05-21 | Discharge: 2021-05-21 | Disposition: A | Discharge status: Home / Self Care | Payer: PPO | Source: Ambulatory Visit | Attending: Internal Medicine | Admitting: Internal Medicine

## 2021-05-21 ENCOUNTER — Other Ambulatory Visit: Payer: Self-pay | Admitting: Internal Medicine

## 2021-05-21 ENCOUNTER — Telehealth: Payer: Self-pay | Admitting: Internal Medicine

## 2021-05-21 DIAGNOSIS — K559 Vascular disorder of intestine, unspecified: Secondary | ICD-10-CM

## 2021-05-21 DIAGNOSIS — K551 Chronic vascular disorders of intestine: Secondary | ICD-10-CM

## 2021-05-21 DIAGNOSIS — R109 Unspecified abdominal pain: Secondary | ICD-10-CM | POA: Diagnosis not present

## 2021-05-21 MED ORDER — ASPIRIN EC 81 MG PO TBEC: 81.0000 mg | DELAYED_RELEASE_TABLET | Freq: Every day | ORAL | 11 refills | Status: AC

## 2021-05-21 NOTE — Progress Notes (Signed)
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 This is likely the cause of his GI symptoms. We will refer him to vascular surgery for consideration of therapies for mesenteric ischemia (please place order for this). Make sure that he is on an aspirin 81 mg QD. Thanks!

## 2021-05-21 NOTE — Progress Notes (Signed)
Mesenteric artery duplex has been completed.   Preliminary results in CV Proc.   Jinny Blossom Chancey Cullinane 05/21/2021 10:25 AM

## 2021-05-21 NOTE — Telephone Encounter (Signed)
Discussed this case with interventional radiologist Dr. Earleen Newport with [facility]. He states that they would be able to arrange for an IR clinic appointment relatively quickly to be able to discuss follow up imaging and potential endovascular therapy for his chronic mesenteric ischemic. Will CC Dr. Earleen Newport to this telephone note.

## 2021-05-22 ENCOUNTER — Other Ambulatory Visit: Payer: Self-pay

## 2021-05-22 ENCOUNTER — Encounter: Payer: Self-pay | Admitting: *Deleted

## 2021-05-22 ENCOUNTER — Ambulatory Visit
Admission: RE | Admit: 2021-05-22 | Discharge: 2021-05-22 | Disposition: A | Discharge status: Home / Self Care | Payer: PPO | Source: Ambulatory Visit | Attending: Internal Medicine | Admitting: Internal Medicine

## 2021-05-22 DIAGNOSIS — K551 Chronic vascular disorders of intestine: Secondary | ICD-10-CM

## 2021-05-22 DIAGNOSIS — I774 Celiac artery compression syndrome: Secondary | ICD-10-CM | POA: Diagnosis not present

## 2021-05-22 DIAGNOSIS — R1084 Generalized abdominal pain: Secondary | ICD-10-CM | POA: Diagnosis not present

## 2021-05-22 HISTORY — PX: IR RADIOLOGIST EVAL & MGMT: IMG5224
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
## 2021-05-23 ENCOUNTER — Other Ambulatory Visit (HOSPITAL_COMMUNITY): Payer: Self-pay | Admitting: Interventional Radiology

## 2021-05-23 DIAGNOSIS — K551 Chronic vascular disorders of intestine: Secondary | ICD-10-CM

## 2021-05-27 ENCOUNTER — Other Ambulatory Visit (HOSPITAL_COMMUNITY): Payer: Self-pay | Admitting: Physician Assistant

## 2021-05-28 ENCOUNTER — Other Ambulatory Visit: Payer: Self-pay | Admitting: Radiology

## 2021-05-28 DIAGNOSIS — N2581 Secondary hyperparathyroidism of renal origin: Secondary | ICD-10-CM | POA: Diagnosis not present

## 2021-05-28 DIAGNOSIS — I129 Hypertensive chronic kidney disease with stage 1 through stage 4 chronic kidney disease, or unspecified chronic kidney disease: Secondary | ICD-10-CM | POA: Diagnosis not present

## 2021-05-28 DIAGNOSIS — D631 Anemia in chronic kidney disease: Secondary | ICD-10-CM | POA: Diagnosis not present

## 2021-05-28 DIAGNOSIS — N184 Chronic kidney disease, stage 4 (severe): Secondary | ICD-10-CM | POA: Diagnosis not present
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
## 2021-05-29 ENCOUNTER — Encounter (HOSPITAL_COMMUNITY): Payer: Self-pay

## 2021-05-29 ENCOUNTER — Other Ambulatory Visit (HOSPITAL_COMMUNITY): Payer: Self-pay | Admitting: Interventional Radiology

## 2021-05-29 ENCOUNTER — Ambulatory Visit (HOSPITAL_COMMUNITY)
Admission: RE | Admit: 2021-05-29 | Discharge: 2021-05-29 | Disposition: A | Discharge status: Home / Self Care | Payer: PPO | Source: Ambulatory Visit | Attending: Interventional Radiology | Admitting: Interventional Radiology

## 2021-05-29 ENCOUNTER — Other Ambulatory Visit: Payer: Self-pay

## 2021-05-29 DIAGNOSIS — I2510 Atherosclerotic heart disease of native coronary artery without angina pectoris: Secondary | ICD-10-CM | POA: Insufficient documentation

## 2021-05-29 DIAGNOSIS — N183 Chronic kidney disease, stage 3 unspecified: Secondary | ICD-10-CM | POA: Insufficient documentation

## 2021-05-29 DIAGNOSIS — K551 Chronic vascular disorders of intestine: Secondary | ICD-10-CM | POA: Insufficient documentation

## 2021-05-29 DIAGNOSIS — Z95810 Presence of automatic (implantable) cardiac defibrillator: Secondary | ICD-10-CM | POA: Insufficient documentation

## 2021-05-29 DIAGNOSIS — I5042 Chronic combined systolic (congestive) and diastolic (congestive) heart failure: Secondary | ICD-10-CM | POA: Insufficient documentation

## 2021-05-29 DIAGNOSIS — Z79890 Hormone replacement therapy (postmenopausal): Secondary | ICD-10-CM | POA: Diagnosis not present

## 2021-05-29 DIAGNOSIS — Z888 Allergy status to other drugs, medicaments and biological substances status: Secondary | ICD-10-CM | POA: Diagnosis not present

## 2021-05-29 DIAGNOSIS — I130 Hypertensive heart and chronic kidney disease with heart failure and stage 1 through stage 4 chronic kidney disease, or unspecified chronic kidney disease: Secondary | ICD-10-CM | POA: Diagnosis not present

## 2021-05-29 DIAGNOSIS — Z7982 Long term (current) use of aspirin: Secondary | ICD-10-CM | POA: Diagnosis not present

## 2021-05-29 DIAGNOSIS — Z951 Presence of aortocoronary bypass graft: Secondary | ICD-10-CM | POA: Diagnosis not present

## 2021-05-29 DIAGNOSIS — I774 Celiac artery compression syndrome: Secondary | ICD-10-CM | POA: Diagnosis not present

## 2021-05-29 DIAGNOSIS — Z79899 Other long term (current) drug therapy: Secondary | ICD-10-CM | POA: Diagnosis not present

## 2021-05-29 HISTORY — PX: IR INTRAVASCULAR ULTRASOUND NON CORONARY: IMG6085

## 2021-05-29 HISTORY — PX: IR ANGIOGRAM VISCERAL SELECTIVE: IMG657

## 2021-05-29 HISTORY — PX: IR US GUIDE VASC ACCESS RIGHT: IMG2390

## 2021-05-29 LAB — BASIC METABOLIC PANEL
Anion gap: 10 (ref 5–15)
BUN: 33 mg/dL — ABNORMAL HIGH (ref 8–23)
CO2: 21 mmol/L — ABNORMAL LOW (ref 22–32)
Calcium: 9.2 mg/dL (ref 8.9–10.3)
Chloride: 104 mmol/L (ref 98–111)
Creatinine, Ser: 2.84 mg/dL — ABNORMAL HIGH (ref 0.61–1.24)
GFR, Estimated: 24 mL/min — ABNORMAL LOW (ref >60)
Glucose, Bld: 100 mg/dL — ABNORMAL HIGH (ref 70–99)
Potassium: 3.1 mmol/L — ABNORMAL LOW (ref 3.5–5.1)
Sodium: 135 mmol/L (ref 135–145)

## 2021-05-29 LAB — CBC
HCT: 41.7 % (ref 39.0–52.0)
Hemoglobin: 14.1 g/dL (ref 13.0–17.0)
MCH: 32.1 pg (ref 26.0–34.0)
MCHC: 33.8 g/dL (ref 30.0–36.0)
MCV: 95 fL (ref 80.0–100.0)
Platelets: 228 10*3/uL (ref 150–400)
RBC: 4.39 MIL/uL (ref 4.22–5.81)
RDW: 12 % (ref 11.5–15.5)
WBC: 9.2 10*3/uL (ref 4.0–10.5)
nRBC: 0 % (ref 0.0–0.2)

## 2021-05-29 LAB — PROTIME-INR
INR: 1 (ref 0.8–1.2)
Prothrombin Time: 13.6 seconds (ref 11.4–15.2)

## 2021-05-29 MED ORDER — FENTANYL CITRATE (PF) 100 MCG/2ML IJ SOLN
INTRAMUSCULAR | Status: AC
  Filled 2021-05-29: qty 4

## 2021-05-29 MED ORDER — HEPARIN SODIUM (PORCINE) 1000 UNIT/ML IJ SOLN
INTRAMUSCULAR | Status: AC
  Filled 2021-05-29: qty 1

## 2021-05-29 MED ORDER — ONDANSETRON HCL 4 MG/2ML IJ SOLN
INTRAMUSCULAR | Status: AC
  Filled 2021-05-29: qty 2

## 2021-05-29 MED ORDER — MIDAZOLAM HCL 2 MG/2ML IJ SOLN
INTRAMUSCULAR | Status: AC | PRN
Start: 2021-05-29 — End: 2021-05-29
  Administered 2021-05-29: .5 mg via INTRAVENOUS
  Administered 2021-05-29: 1 mg via INTRAVENOUS
  Administered 2021-05-29 (×2): .5 mg via INTRAVENOUS

## 2021-05-29 MED ORDER — SODIUM CHLORIDE 0.9 % IV SOLN: INTRAVENOUS | Status: DC

## 2021-05-29 MED ORDER — MIDAZOLAM HCL 2 MG/2ML IJ SOLN
INTRAMUSCULAR | Status: AC
  Filled 2021-05-29: qty 4

## 2021-05-29 MED ORDER — ONDANSETRON HCL 4 MG/2ML IJ SOLN
INTRAMUSCULAR | Status: AC | PRN
  Administered 2021-05-29: 4 mg via INTRAVENOUS

## 2021-05-29 MED ORDER — FENTANYL CITRATE (PF) 100 MCG/2ML IJ SOLN
INTRAMUSCULAR | Status: AC | PRN
  Administered 2021-05-29: 50 ug via INTRAVENOUS
  Administered 2021-05-29 (×3): 25 ug via INTRAVENOUS

## 2021-05-29 MED ORDER — LIDOCAINE HCL 1 % IJ SOLN
INTRAMUSCULAR | Status: AC
  Administered 2021-05-29: 10 mL via INTRADERMAL
  Filled 2021-05-29: qty 20

## 2021-05-29 NOTE — Procedures (Signed)
Interventional Radiology Procedure Note  Procedure:  1) CO2 abdominal aortogram 2) CO2 celiac angiogram 3) Intravascular ultrasound 4) Arterial manometry  Findings: Please refer to procedural dictation for full description.  CO2 angiography and IVUS eval of celiac trunk demonstrate focal stenosis exacerbated most by deep inspiration.  No significant atherosclerotic disease on IVUS or evidence of calcium radiographically.  Small mean pressure gradient across stenosis, 3 mmHg at rest, 8 mmHg with inspiration, and 9 mmHg with expiration.    R CFA access, 8 Fr Angioseal closure.  Complications: None immediate  Estimated Blood Loss: < 5 mL  Recommendations: Strict 4 hour bedrest, flat for 2 hours, head of bed up to 30 degrees for 2 hours. Plan to discharge home today. Will arrange for Vascular Surgery consultation regarding MALS.   Ruthann Cancer, MD Pager: [phone]

## 2021-05-29 NOTE — Sedation Documentation (Signed)
Angio Seal deployed right femoral artery at 1036 by Dr Serafina Royals.

## 2021-05-29 NOTE — Progress Notes (Signed)
Dr Serafina Royals aware patient would like to speak to him re the procedure and what the plan is moving forward.

## 2021-05-29 NOTE — Sedation Documentation (Signed)
Has intermittant right shoulder pain due to positioning. Repositioned arm with good effect. Also has intermittant abdominal discomfort after CO2 via right femoral catheter per Dr Serafina Royals

## 2021-05-29 NOTE — Sedation Documentation (Addendum)
Distal Celiac Artery pressure 94/80 Mean 87  Proximal Celiac Artery pressure 98/81 Mean 90  Distal Celiac Artery pressure inspiration 88/74 Mean 82  Distal Celiac Artery pressure expiration 84/76 Mean 81  Abdominal Aorta pressure 88/79 Mean 84

## 2021-06-03 DIAGNOSIS — N2581 Secondary hyperparathyroidism of renal origin: Secondary | ICD-10-CM | POA: Diagnosis not present

## 2021-06-03 DIAGNOSIS — N184 Chronic kidney disease, stage 4 (severe): Secondary | ICD-10-CM | POA: Diagnosis not present

## 2021-06-03 DIAGNOSIS — D631 Anemia in chronic kidney disease: Secondary | ICD-10-CM | POA: Diagnosis not present

## 2021-06-03 DIAGNOSIS — I129 Hypertensive chronic kidney disease with stage 1 through stage 4 chronic kidney disease, or unspecified chronic kidney disease: Secondary | ICD-10-CM | POA: Diagnosis not present

## 2021-06-04 ENCOUNTER — Encounter: Payer: PPO | Admitting: Vascular Surgery

## 2021-06-04 ENCOUNTER — Other Ambulatory Visit: Payer: Self-pay | Admitting: Family Medicine

## 2021-06-04 DIAGNOSIS — M5489 Other dorsalgia: Secondary | ICD-10-CM

## 2021-06-04 MED ORDER — HYDROCODONE-ACETAMINOPHEN 10-325 MG PO TABS: 1.0000 | ORAL_TABLET | Freq: Three times a day (TID) | ORAL | 0 refills | Status: DC | PRN

## 2021-06-04 NOTE — Telephone Encounter (Signed)
Medication Refill - Medication: HYDROcodone-acetaminophen (NORCO) 10-325 MG tablet   Has the patient contacted their pharmacy? No. (Agent: If no, request that the patient contact the pharmacy for the refill. If patient does not wish to contact the pharmacy document the reason why and proceed with request.) (Agent: If yes, when and what did the pharmacy advise?)Pt states he has to call this RX in every month   Preferred Pharmacy (with phone number or street name): [id number] [address], [place], [place] [address]  Phone:  [phone]  Fax:  [phone]  Has the patient been seen for an appointment in the last year OR does the patient have an upcoming appointment? Yes.    Agent: Please be advised that RX refills may take up to 3 business days. We ask that you follow-up with your pharmacy.

## 2021-06-04 NOTE — Telephone Encounter (Signed)
Requested medication (s) are due for refill today: yes  Requested medication (s) are on the active medication list: {yes   Last refill: 04/10/21  #60  0 refills  Future visit scheduled no  Notes to clinic: not delegated  Requested Prescriptions  Pending Prescriptions Disp Refills   HYDROcodone-acetaminophen (NORCO) 10-325 MG tablet 60 tablet 0    Sig: Take 1 tablet by mouth every 8 (eight) hours as needed.     Not Delegated - Analgesics:  Opioid Agonist Combinations Failed - 06/04/2021 10:39 AM      Failed - This refill cannot be delegated      Failed - Urine Drug Screen completed in last 360 days      Passed - Valid encounter within last 6 months    Recent Outpatient Visits           3 months ago Primary insomnia   [facility] Birdie Sons, MD   8 months ago No-show for appointment   [place], FNP   11 months ago Depression, major, single episode, moderate [facility])   [facility] Birdie Sons, MD   1 year ago Syncope, unspecified syncope type   [facility] Trinna Post, PA-C   1 year ago Insomnia, unspecified type   [facility] Birdie Sons, MD

## 2021-06-09 ENCOUNTER — Telehealth: Payer: Self-pay

## 2021-06-09 ENCOUNTER — Ambulatory Visit (INDEPENDENT_AMBULATORY_CARE_PROVIDER_SITE_OTHER): Payer: PPO

## 2021-06-09 DIAGNOSIS — Z95810 Presence of automatic (implantable) cardiac defibrillator: Secondary | ICD-10-CM

## 2021-06-09 DIAGNOSIS — I5042 Chronic combined systolic (congestive) and diastolic (congestive) heart failure: Secondary | ICD-10-CM

## 2021-06-09 NOTE — Telephone Encounter (Signed)
Remote ICM transmission received.  Attempted call to patient regarding ICM remote transmission and left detailed message per DPR.  Advised to return call for any fluid symptoms or questions. Next ICM remote transmission scheduled 06/17/2021.

## 2021-06-09 NOTE — Progress Notes (Signed)
EPIC Encounter for ICM Monitoring  Patient Name: Richard Cook is a 65 y.o. male Date: 06/09/2021 Primary Care Physican: Birdie Sons, MD Primary Cardiologist: [name] Electrophysiologist: [name] 03/19/2021 Office Weight: 177 lbs   Since 08-May-2021  VT-NS (>4 beats, >120 bpm)    95 SVT: VT/VF Rx Withheld           70                                                            Attempted call to patient and unable to reach.  Left detailed message per DPR regarding transmission. Transmission reviewed.    Optivol thoracic impedance suggesting possible fluid accumulation since 10/24.   Prescribed: Furosemide 40 mg take 2 tablets (80 mg total) by mouth daily.   Labs: 05/28/2021 Creatinine 2.84, BUN 33, Potassium 3.1, Sodium 135, GFR 24 05/16/2021 Creatinine 3.33, BUN 35, Potassium 3.5, Sodium 138, GFR 20  04/26/2021 Creatinine 2.24, BUN 43, Potassium 4.2, Sodium 139, GFR 20  02/24/2021 Creatinine 3.44, BUN 36, Potassium 5.2, Sodium 144, GFR 19 11/11/2020 Creatinine 4.84, BUN 48, Potassium 3.8, Sodium 142, GFR 13  A complete set of results can be found in Results Review.   Recommendations:  Left voice mail with ICM number and encouraged to call if experiencing any fluid symptoms.   Follow-up plan: ICM clinic phone appointment on 06/17/2021 (manual) to recheck fluid levels.   91 day device clinic remote transmission 08/08/2021.     EP/Cardiology Office Visits:  Recall 07/19/2021 with Dr. [name].     Copy of ICM check sent to Dr. [name].     3 month ICM trend: 06/09/2021.    1 Year ICM trend:       Rosalene Billings, RN 06/09/2021 4:14 PM

## 2021-06-10 ENCOUNTER — Ambulatory Visit (INDEPENDENT_AMBULATORY_CARE_PROVIDER_SITE_OTHER): Payer: PPO | Admitting: Internal Medicine

## 2021-06-10 ENCOUNTER — Encounter: Payer: Self-pay | Admitting: Internal Medicine

## 2021-06-10 VITALS — BP 134/72 | HR 70 | Ht 70.0 in | Wt 176.0 lb

## 2021-06-10 DIAGNOSIS — R110 Nausea: Secondary | ICD-10-CM | POA: Diagnosis not present

## 2021-06-10 DIAGNOSIS — I774 Celiac artery compression syndrome: Secondary | ICD-10-CM

## 2021-06-10 DIAGNOSIS — R109 Unspecified abdominal pain: Secondary | ICD-10-CM | POA: Diagnosis not present

## 2021-06-10 NOTE — Patient Instructions (Addendum)
If you are age 64 or younger, your body mass index should be between 19-25. Your Body mass index is 25.25 kg/m. If this is out of the aformentioned range listed, please consider follow up with your Primary Care Provider.   ________________________________________________________  The [facility] providers would like to encourage you to use [facility] to communicate with providers for non-urgent requests or questions.  Due to long hold times on the telephone, sending your provider a message by [facility] may be a faster and more efficient way to get a response.  Please allow 48 business hours for a response.  Please remember that this is for non-urgent requests.  _______________________________________________________   Your EGD has been cancelled for 07-10-21.  You are scheduled for Vascular Surgery Consult on 06-20-21 at 9am. Please arrive at 8:45 for registration.   You will need to follow up with our office in 6 months (May 2023).  We will contact you to schedule this appointment.  Thank you for entrusting me with your care and choosing [facility].  Dr Lorenso Courier

## 2021-06-10 NOTE — Progress Notes (Signed)
Chief Complaint: Nausea, ab pain  HPI : 65 year old male with history of CAD, HFrEF (EF 30-35%), V-tach s/p ICD, CKD, DVT, OSA, rheumatoid arthritis, gout, hypothyroidism, prior C dif presented with nausea and abdominal pain.  Interval History: Since his last visit with me, he had a mesenteric duplex performed that showed 70 to 99% stenosis in the celiac artery.  He was evaluated by IR who performed arteriogram that did not show clear signs of chronic mesenteric ischemia.  Instead the arteriogram seemed to suggest that he may have median arcuate ligament syndrome.  He was started on a baby aspirin by IR, which he believes has help with his symptoms. Denies any issues with nausea or abdominal pain at this time.  He has been able to eat without issues.  He stopped the carafate and PPI, which he felt like were just making his symptoms worse. He is still staying pretty active. 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   Wt Readings from Last 3 Encounters:  06/10/21 176 lb (79.8 kg)  05/29/21 169 lb (76.7 kg)  05/16/21 173 lb (78.5 kg)   Current Outpatient Medications  Medication Sig Dispense Refill   aspirin EC 81 MG tablet Take 1 tablet (81 mg total) by mouth daily. Swallow whole. 30 tablet 11   cholecalciferol (VITAMIN D3) 25 MCG (1000 UNIT) tablet Take 1,000 Units by mouth daily.     clonazePAM (KLONOPIN) 1 MG tablet 1/2 tablet every day at bedtime (Patient taking differently: Take 1 mg by mouth at bedtime.)     furosemide (LASIX) 40 MG tablet Take 80 mg by mouth daily.     HYDROcodone-acetaminophen (NORCO) 10-325 MG tablet Take 1 tablet by mouth every 8 (eight) hours as needed. 60 tablet 0   ibuprofen (ADVIL) 200 MG tablet Take 400 mg by mouth every 6 (six) hours as needed for headache or moderate pain.     levothyroxine (SYNTHROID) 88 MCG tablet Take 1 tablet (88 mcg total) by mouth daily. 90 tablet 3    mexiletine (MEXITIL) 250 MG capsule Take 250 mg by mouth 3 (three) times daily.     Omega-3 Fatty Acids (FISH OIL) 1200 MG CAPS Take 1,200 mg by mouth daily.     No current facility-administered medications for this visit.    Review of Systems: All systems reviewed and negative except where noted in HPI.   Physical Exam: BP 134/72   Pulse 70   Ht '5\' 10"'  (1.778 m)   Wt 176 lb (79.8 kg)   BMI 25.25 kg/m  Constitutional: Pleasant,well-developed, male in no acute distress. HEENT: Normocephalic and atraumatic Cardiovascular: Normal rate Pulmonary/chest: Effort normal Neurological: Alert and oriented to person place and time. Psychiatric: Normal mood and affect. Behavior is normal.  Labs 04/2019: Negative cologuard  Labs 01/20/21: Nml TSH  Labs 02/24/21: Negative H pylori breath test, nml lipase, nml CBC, mildly elevated alk phos of 151  Renal U/S 03/24/19: IMPRESSION: 1. No hydronephrosis. 2. Echogenic kidneys bilaterally which can be seen in patients with medical renal disease. 3. Nonobstructing 1 cm stone in the lower pole the left kidney. 4. Lobular contour of both kidneys, similar to prior CT. 5. Bladder not evaluated secondary to underdistention.  RUQ U/S 08/18/20: IMPRESSION: Status post cholecystectomy. No other abnormality seen in the right upper quadrant of the abdomen.  TTE 08/19/20: 1. Left ventricular ejection fraction, by estimation, is 30 to  35%. The  left ventricle has moderately decreased function. The left ventricle  demonstrates global hypokinesis. Left ventricular diastolic parameters are consistent with Grade I diastolic dysfunction (impaired relaxation).   2. Right ventricular systolic function is mildly reduced. The right  ventricular size is normal. There is mildly elevated pulmonary artery  systolic pressure. The estimated right ventricular systolic pressure is  70.0 mmHg.   3. The mitral valve is grossly normal. Trivial mitral valve  regurgitation. No  evidence of mitral stenosis.   4. The aortic valve is grossly normal. Aortic valve regurgitation is  mild. No aortic stenosis is present.   5. Aortic dilatation noted. There is borderline dilatation of the  ascending aorta, measuring 38 mm.   6. The inferior vena cava is dilated in size with <50% respiratory  variability, suggesting right atrial pressure of 15 mmHg  Mesenteric duplex 05/21/21: Summary:  Mesenteric:  Normal Superior Mesenteric artery and Inferior Mesenteric artery findings.  70 to 99% stenosis in the celiac artery.  IR angiogram visceral selective 05/29/21: IMPRESSION: Findings most suggestive of median arcuate ligament compression upon the celiac trunk. No evidence of atherosclerotic stenosis. Angiographically, there was improved patency with expiration, however on IVUS and with manometry, there is similar worsening focal stenosis with deep inspiration and expiration. PLAN: Will refer to Vascular Surgery for consideration of median arcuate ligament release.  ASSESSMENT AND PLAN: Median arcuate ligament syndrome Nausea Abdominal pain Patient has had episodic nausea and abdominal pain.  Work-up thus far has revealed possible median arcuate ligament syndrome based upon angiogram performed by IR.  He has not responded to a trial of PPI or sucralfate therapy.  Patient is no longer interested in getting a follow-up EGD since his abdominal pain and nausea has improved.  He feels that baby aspirin he was started on has helped significantly with the symptoms.  He has follow up with vascular surgery for evaluation of possible median arcuate ligament syndrome. Patient will let me know if his ab pain recurs before our next schedule clinic appt. - Cancel EGD - Has appointment with vascular surgery - Colon cancer screening next due in 04/2022 - RTC in 6 month to check in  Christia Reading, MD  I spent 42 minutes of time, including in depth chart review, independent review of  results as outlined above, communicating results with the patient directly, face-to-face time with the patient, coordinating care, ordering studies and medications as appropriate, and documentation.

## 2021-06-13 ENCOUNTER — Other Ambulatory Visit: Payer: Self-pay | Admitting: Internal Medicine

## 2021-06-13 ENCOUNTER — Other Ambulatory Visit: Payer: Self-pay | Admitting: Family Medicine

## 2021-06-13 DIAGNOSIS — F411 Generalized anxiety disorder: Secondary | ICD-10-CM

## 2021-06-13 DIAGNOSIS — F5101 Primary insomnia: Secondary | ICD-10-CM

## 2021-06-14 NOTE — Telephone Encounter (Signed)
Requested medications are due for refill today NO  Requested medications are on the active medication list  Dose and signature inconsistent with current med list, please assess.  Last refill 04/20/21  Last visit 02/24/21  Future visit scheduled no  Notes to clinic not delegated, and dose/sig inconsistent with current med list. Requested Prescriptions  Pending Prescriptions Disp Refills   clonazePAM (KLONOPIN) 1 MG tablet [Pharmacy Med Name: CLONAZEPAM 1 MG TABLET] 75 tablet     Sig: TAKE ONE TABLET EVERY AFTERNOON AND 1 & 1/2 TABLET AT BEDTIME     Not Delegated - Psychiatry:  Anxiolytics/Hypnotics Failed - 06/13/2021  1:21 PM      Failed - This refill cannot be delegated      Failed - Urine Drug Screen completed in last 360 days      Passed - Valid encounter within last 6 months    Recent Outpatient Visits           3 months ago Primary insomnia   [facility] Birdie Sons, MD   9 months ago No-show for appointment   [facility] Flinchum, Kelby Aline, FNP   1 year ago Depression, major, single episode, moderate [facility])   [facility] Birdie Sons, MD   1 year ago Syncope, unspecified syncope type   [facility], Wendee Beavers, PA-C   1 year ago Insomnia, unspecified type   [facility] Birdie Sons, MD
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
## 2021-06-20 ENCOUNTER — Encounter: Payer: Self-pay | Admitting: Vascular Surgery

## 2021-06-20 ENCOUNTER — Other Ambulatory Visit: Payer: Self-pay

## 2021-06-20 ENCOUNTER — Ambulatory Visit: Payer: PPO | Admitting: Vascular Surgery

## 2021-06-20 VITALS — BP 105/76 | HR 70 | Temp 97.9°F | Resp 20 | Ht 70.0 in | Wt 178.0 lb

## 2021-06-20 DIAGNOSIS — R110 Nausea: Secondary | ICD-10-CM

## 2021-06-20 NOTE — Progress Notes (Signed)
No ICM remote transmission received for 06/17/2021 and next ICM transmission scheduled for 07/14/2021.

## 2021-06-23 ENCOUNTER — Telehealth: Payer: Self-pay | Admitting: Internal Medicine

## 2021-06-23 NOTE — Telephone Encounter (Signed)
Transmission reviewed. NSVT and SVT wavelet appear to be same morphology. Successful telephone call to patient. Patient admits to hiking yesterday and today for 10 miles during tachycardia episodes noted below. He denies any and all symptoms of a tachyrhythmia. Patient IS NOT taking his lasix daily and states he can't because it makes him "sick". He is encouraged to take all medications as prescribed. Discussed role of fluid retention on stretch and irritability of the heart. Patient is due for OV 07/2021 with Dr. [name]. Recall sent. Patient is instructed to call office tomorrow to be scheduled as he has a lot of medication questions device RN is unable to answer. Will route to Dr. [name] for review of EGMs to determine if rhythm is SVT or slow VT below detection as patient has history of slow VT. Amio discontinued 08/2020. Patient compliant with mexiletine as prescribed.

## 2021-06-23 NOTE — Telephone Encounter (Signed)
Attempted ICM call to patient.  Left detailed message regarding fluid levels.  Advised transmission suggesting pattern of possible fluid accumulation x 3-4 days with return to baseline for 1 day.  The pattern correlates with taking Furosemide every 3-4 days instead of daily as prescribed. Advised to take Furosemide daily as prescribed which may prevent fluid symptoms he described in previous call.     Advised device clinic will contact him regarding his elevated HF and will review with him.

## 2021-06-23 NOTE — Telephone Encounter (Signed)
STAT if HR is under 50 or over 120 (normal HR is 60-100 beats per minute)  What is your heart rate? 112 (just got done walking )  Do you have a log of your heart rate readings (document readings)? no  Do you have any other symptoms? [place] [address] [place] and hr got up to 145.. pt says device clinic is also saying that he has fluid buildup around his heart please advise   Pt c/o swelling: STAT is pt has developed SOB within 24 hours  If swelling, where is the swelling located? In feet   How much weight have you gained and in what time span? 2 pounds a day until he takes a fluid pill   Have you gained 3 pounds in a day or 5 pounds in a week? yes  Do you have a log of your daily weights (if so, list)? 168, 170, 173, 175  Are you currently taking a fluid pill? yes  Are you currently SOB? no  Have you traveled recently? Just to the [employer]

## 2021-06-23 NOTE — Telephone Encounter (Signed)
Spoke with pt who states his HR became elevated at 145 yesterday while hiking at about 6000 feet and carrying a 23 pound back pack.  Pt also reports swelling of feet and an increase in weight of about 4 pounds over the past 3 days.  He states he last took his Furosemide 80mg  on Saturday as he cannot take medication daily as it makes him "feel so bad."  He reports he takes Furosemide about every 3-4 days.  He denies current CP,SOB or dizziness.  He did cough some while on phone call but states it is not productive.  Pt is most concerned because he has never seen his heart elevate as much as it did yesterday.  He reports no other symptoms at that time. Will forward to device clinic for the review of transmission he states he sent 30 minutes ago as well as Deniece Ree.  Pt verbalizes understanding and agrees with current plan.

## 2021-07-02 NOTE — Telephone Encounter (Signed)
Most consistent with sinus tach   when he comes in we can reprogram his discriminators Thanks SK

## 2021-07-07 ENCOUNTER — Other Ambulatory Visit: Payer: Self-pay | Admitting: Internal Medicine

## 2021-07-09 ENCOUNTER — Other Ambulatory Visit: Payer: Self-pay | Admitting: Family Medicine

## 2021-07-09 DIAGNOSIS — M5489 Other dorsalgia: Secondary | ICD-10-CM

## 2021-07-09 NOTE — Telephone Encounter (Signed)
Medication Refill - Medication: HYDROcodone-acetaminophen (NORCO) 10-325 MG tablet   Has the patient contacted their pharmacy? No. (Agent: If no, request that the patient contact the pharmacy for the refill. If patient does not wish to contact the pharmacy document the reason why and proceed with request.) (Agent: If yes, when and what did the pharmacy advise?) Pt calls this in every month  Preferred Pharmacy (with phone number or street name): [id number] [address], [place], [place] [address]  Phone:  [phone]  Fax:  [phone]  Has the patient been seen for an appointment in the last year OR does the patient have an upcoming appointment? Yes.    Agent: Please be advised that RX refills may take up to 3 business days. We ask that you follow-up with your pharmacy.

## 2021-07-09 NOTE — Telephone Encounter (Signed)
Requested medication (s) are due for refill today: yes  Requested medication (s) are on the active medication list: yes  Last refill:  06/04/21 #60 0 refills  Future visit scheduled: no  Notes to clinic:  not delegated per protocol. Do you want to refill Rx?     Requested Prescriptions  Pending Prescriptions Disp Refills   HYDROcodone-acetaminophen (NORCO) 10-325 MG tablet 60 tablet 0    Sig: Take 1 tablet by mouth every 8 (eight) hours as needed.     Not Delegated - Analgesics:  Opioid Agonist Combinations Failed - 07/09/2021  1:18 PM      Failed - This refill cannot be delegated      Failed - Urine Drug Screen completed in last 360 days      Passed - Valid encounter within last 6 months    Recent Outpatient Visits           4 months ago Primary insomnia   [facility] Birdie Sons, MD   9 months ago No-show for appointment   [facility] Fl[facility]um, Kelby Aline, FNP   1 year ago Depression, major, single episode, moderate [facility])   [facility] Birdie Sons, MD   1 year ago Syncope, unspecified syncope type   [facility] Trinna Post, PA-C   1 year ago Insomnia, unspecified type   [facility] Birdie Sons, MD

## 2021-07-10 ENCOUNTER — Encounter (HOSPITAL_COMMUNITY): Payer: Self-pay

## 2021-07-10 ENCOUNTER — Ambulatory Visit (HOSPITAL_COMMUNITY): Admit: 2021-07-10 | Payer: PPO | Admitting: Internal Medicine

## 2021-07-10 SURGERY — ESOPHAGOGASTRODUODENOSCOPY (EGD) WITH PROPOFOL
Anesthesia: Monitor Anesthesia Care

## 2021-07-10 MED ORDER — HYDROCODONE-ACETAMINOPHEN 10-325 MG PO TABS: 1.0000 | ORAL_TABLET | Freq: Three times a day (TID) | ORAL | 0 refills | Status: DC | PRN

## 2021-07-14 ENCOUNTER — Ambulatory Visit (INDEPENDENT_AMBULATORY_CARE_PROVIDER_SITE_OTHER): Payer: PPO

## 2021-07-14 DIAGNOSIS — Z95810 Presence of automatic (implantable) cardiac defibrillator: Secondary | ICD-10-CM | POA: Diagnosis not present

## 2021-07-14 DIAGNOSIS — I5042 Chronic combined systolic (congestive) and diastolic (congestive) heart failure: Secondary | ICD-10-CM

## 2021-07-15 NOTE — Progress Notes (Signed)
EPIC Encounter for ICM Monitoring  Patient Name: Richard Cook is a 65 y.o. male Date: 07/15/2021 Primary Care Physican: Birdie Sons, MD Primary Cardiologist: [name] Electrophysiologist: [name] 03/19/2021 Office Weight: 177 lbs    Since 23-Jun-2021 VT-NS (>4 beats, >120 bpm)    58 SVT: VT/VF Rx Withheld           29                                                            Transmission reviewed.    Optivol thoracic impedance suggesting normal fluid levels since ~11/23.   Prescribed: Furosemide 40 mg take 2 tablets (80 mg total) by mouth daily.   Labs: 05/28/2021 Creatinine 2.84, BUN 33, Potassium 3.1, Sodium 135, GFR 24 05/16/2021 Creatinine 3.33, BUN 35, Potassium 3.5, Sodium 138, GFR 20  04/26/2021 Creatinine 2.24, BUN 43, Potassium 4.2, Sodium 139, GFR 20  02/24/2021 Creatinine 3.44, BUN 36, Potassium 5.2, Sodium 144, GFR 19 11/11/2020 Creatinine 4.84, BUN 48, Potassium 3.8, Sodium 142, GFR 13  A complete set of results can be found in Results Review.   Recommendations:  No changes.   Follow-up plan: ICM clinic phone appointment on 08/18/2021.   91 day device clinic remote transmission 08/08/2021.     EP/Cardiology Office Visits:  07/17/2021 with Dr. [name].     Copy of ICM check sent to Dr. [name].     3 month ICM trend: 04/23/2021.    12-14 Month ICM trend:       Rosalene Billings, RN 07/15/2021 9:18 AM

## 2021-07-17 ENCOUNTER — Ambulatory Visit: Payer: PPO | Admitting: Internal Medicine

## 2021-07-17 ENCOUNTER — Encounter: Payer: Self-pay | Admitting: Internal Medicine

## 2021-07-17 ENCOUNTER — Other Ambulatory Visit: Payer: Self-pay

## 2021-07-17 VITALS — BP 108/68 | HR 86 | Ht 70.0 in | Wt 175.0 lb

## 2021-07-17 DIAGNOSIS — I255 Ischemic cardiomyopathy: Secondary | ICD-10-CM | POA: Diagnosis not present

## 2021-07-17 DIAGNOSIS — Z95810 Presence of automatic (implantable) cardiac defibrillator: Secondary | ICD-10-CM | POA: Diagnosis not present

## 2021-07-17 DIAGNOSIS — I472 Ventricular tachycardia, unspecified: Secondary | ICD-10-CM

## 2021-07-17 DIAGNOSIS — I5022 Chronic systolic (congestive) heart failure: Secondary | ICD-10-CM

## 2021-07-17 LAB — CUP PACEART INCLINIC DEVICE CHECK
Battery Remaining Longevity: 42 mo
Battery Voltage: 2.94 V
Brady Statistic RV Percent Paced: 0.01 %
Date Time Interrogation Session: 20221215161812
HighPow Impedance: 86 Ohm
Implantable Lead Implant Date: 20150318
Implantable Lead Location: 753860
Implantable Lead Model: 181
Implantable Lead Serial Number: 327195
Implantable Pulse Generator Implant Date: 20150318
Lead Channel Impedance Value: 703 Ohm
Lead Channel Impedance Value: 722 Ohm
Lead Channel Pacing Threshold Amplitude: 0.875 V
Lead Channel Pacing Threshold Pulse Width: 0.4 ms
Lead Channel Sensing Intrinsic Amplitude: 9.125 mV
Lead Channel Sensing Intrinsic Amplitude: 9.125 mV
Lead Channel Setting Pacing Amplitude: 2 V
Lead Channel Setting Pacing Pulse Width: 0.4 ms
Lead Channel Setting Sensing Sensitivity: 0.3 mV

## 2021-07-17 MED ORDER — BISOPROLOL FUMARATE 5 MG PO TABS: 2.5000 mg | ORAL_TABLET | Freq: Every day | ORAL | 3 refills | Status: DC
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
## 2021-07-17 NOTE — Patient Instructions (Addendum)
Medication Instructions:  Your physician has recommended you make the following change in your medication:   ** Begin Bisoprolol 5 mg - 1/2 tablet by mouth daily.  *If you need a refill on your cardiac medications before your next appointment, please call your pharmacy*   Lab Work: BMET today  If you have labs (blood work) drawn today and your tests are completely normal, you will receive your results only by: [place] (if you have MyChart) OR A paper copy in the mail If you have any lab test that is abnormal or we need to change your treatment, we will call you to review the results.   Testing/Procedures: Your physician has requested that you have an echocardiogram. Echocardiography is a painless test that uses sound waves to create images of your heart. It provides your doctor with information about the size and shape of your heart and how well your hearts chambers and valves are working. This procedure takes approximately one hour. There are no restrictions for this procedure.     Follow-Up: At [facility], you and your health needs are our priority.  As part of our continuing mission to provide you with exceptional heart care, we have created designated Provider Care Teams.  These Care Teams include your primary Cardiologist (physician) and Advanced Practice Providers (APPs -  Physician Assistants and Nurse Practitioners) who all work together to provide you with the care you need, when you need it.  We recommend signing up for the patient portal called "MyChart".  Sign up information is provided on this After Visit Summary.  MyChart is used to connect with patients for Virtual Visits (Telemedicine).  Patients are able to view lab/test results, encounter notes, upcoming appointments, etc.  Non-urgent messages can be sent to your provider as well.   To learn more about what you can do with MyChart, go to [email or web address].    Your next appointment:  11/18/2021 at  145pm with Dr [name]

## 2021-07-18 ENCOUNTER — Telehealth: Payer: Self-pay

## 2021-07-18 LAB — BASIC METABOLIC PANEL
BUN/Creatinine Ratio: 12 (ref 10–24)
BUN: 47 mg/dL — ABNORMAL HIGH (ref 8–27)
CO2: 16 mmol/L — ABNORMAL LOW (ref 20–29)
Calcium: 10.2 mg/dL (ref 8.6–10.2)
Chloride: 100 mmol/L (ref 96–106)
Creatinine, Ser: 3.79 mg/dL — ABNORMAL HIGH (ref 0.76–1.27)
Glucose: 94 mg/dL (ref 70–99)
Potassium: 3.8 mmol/L (ref 3.5–5.2)
Sodium: 141 mmol/L (ref 134–144)
eGFR: 17 mL/min/{1.73_m2} — ABNORMAL LOW (ref >59)

## 2021-07-18 NOTE — Telephone Encounter (Signed)
Attempted phone call to pt.  Per Epic OK to leave detailed voicemail message.  Pt advised per Dr [name] K+ is normal but Creatinine is elevated and he should contact his kidney doctor and take his diuretic only every 3rd day until then.  Pt may call [phone] for further questions.

## 2021-07-18 NOTE — Telephone Encounter (Signed)
-----   Message from [name], MD sent at 07/18/2021 12:09 PM EST ----- Please Inform Patient that potassium is normal  Cr 3.79  he should call his renal MD and take his diuretic only every 3rd day until then  Thanks

## 2021-07-21 NOTE — Telephone Encounter (Signed)
Spoke with pt and reviewed lab results with Dr Olin Pia comments with pt.  Advised pt to contact his renal provider to discuss results and get direction re: fluid medication.  Pt verbalizes understanding and states he will call "kidney Dr" now.

## 2021-07-25 ENCOUNTER — Ambulatory Visit: Payer: PPO | Admitting: Podiatry

## 2021-07-25 ENCOUNTER — Other Ambulatory Visit: Payer: Self-pay

## 2021-07-25 DIAGNOSIS — M7752 Other enthesopathy of left foot: Secondary | ICD-10-CM

## 2021-08-05 ENCOUNTER — Encounter: Payer: Self-pay | Admitting: Podiatry

## 2021-08-05 ENCOUNTER — Other Ambulatory Visit: Payer: Self-pay

## 2021-08-05 ENCOUNTER — Ambulatory Visit: Payer: PPO | Admitting: Podiatry

## 2021-08-05 DIAGNOSIS — M21372 Foot drop, left foot: Secondary | ICD-10-CM | POA: Diagnosis not present

## 2021-08-05 DIAGNOSIS — M2042 Other hammer toe(s) (acquired), left foot: Secondary | ICD-10-CM

## 2021-08-05 DIAGNOSIS — L989 Disorder of the skin and subcutaneous tissue, unspecified: Secondary | ICD-10-CM | POA: Diagnosis not present

## 2021-08-05 MED ORDER — BETAMETHASONE SOD PHOS & ACET 6 (3-3) MG/ML IJ SUSP: 3.0000 mg | Freq: Once | INTRAMUSCULAR | Status: DC
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
## 2021-08-06 ENCOUNTER — Ambulatory Visit (HOSPITAL_COMMUNITY): Payer: PPO | Attending: Cardiology

## 2021-08-06 DIAGNOSIS — I255 Ischemic cardiomyopathy: Secondary | ICD-10-CM | POA: Diagnosis not present

## 2021-08-06 DIAGNOSIS — I472 Ventricular tachycardia, unspecified: Secondary | ICD-10-CM | POA: Insufficient documentation

## 2021-08-06 DIAGNOSIS — I5022 Chronic systolic (congestive) heart failure: Secondary | ICD-10-CM | POA: Diagnosis not present

## 2021-08-06 LAB — ECHOCARDIOGRAM COMPLETE
Area-P 1/2: 1.68 cm2
S' Lateral: 4.6 cm

## 2021-08-06 MED ORDER — PERFLUTREN LIPID MICROSPHERE
1.0000 mL | INTRAVENOUS | Status: AC | PRN
  Administered 2021-08-06: 2 mL via INTRAVENOUS

## 2021-08-08 ENCOUNTER — Ambulatory Visit (INDEPENDENT_AMBULATORY_CARE_PROVIDER_SITE_OTHER): Payer: PPO

## 2021-08-08 DIAGNOSIS — I255 Ischemic cardiomyopathy: Secondary | ICD-10-CM

## 2021-08-08 LAB — CUP PACEART REMOTE DEVICE CHECK
Battery Remaining Longevity: 40 mo
Battery Voltage: 2.97 V
Brady Statistic RV Percent Paced: 0.69 %
Date Time Interrogation Session: 20230106022823
HighPow Impedance: 77 Ohm
Implantable Lead Implant Date: 20150318
Implantable Lead Location: 753860
Implantable Lead Model: 181
Implantable Lead Serial Number: 327195
Implantable Pulse Generator Implant Date: 20150318
Lead Channel Impedance Value: 608 Ohm
Lead Channel Impedance Value: 646 Ohm
Lead Channel Pacing Threshold Amplitude: 0.875 V
Lead Channel Pacing Threshold Pulse Width: 0.4 ms
Lead Channel Sensing Intrinsic Amplitude: 8.25 mV
Lead Channel Sensing Intrinsic Amplitude: 8.25 mV
Lead Channel Setting Pacing Amplitude: 2 V
Lead Channel Setting Pacing Pulse Width: 0.4 ms
Lead Channel Setting Sensing Sensitivity: 0.3 mV

## 2021-08-11 ENCOUNTER — Telehealth: Payer: Self-pay | Admitting: Internal Medicine

## 2021-08-11 NOTE — Telephone Encounter (Signed)
Patient returning call to go over Echo results from 08/06/21

## 2021-08-12 NOTE — Telephone Encounter (Signed)
Spoke with pt and advised per Dr [name] echo shows stable heart muscle function.  Pt states HR is much better taking beta blocker.

## 2021-08-12 NOTE — Telephone Encounter (Signed)
-----   Message from [name], MD sent at 08/08/2021  1:59 PM EST ----- Please Inform Patient Echo showed  stable heart muscle function  What is his HR doing  ? Now off amio Thanks SK

## 2021-08-14 ENCOUNTER — Other Ambulatory Visit: Payer: Self-pay | Admitting: Family Medicine

## 2021-08-14 DIAGNOSIS — M5489 Other dorsalgia: Secondary | ICD-10-CM

## 2021-08-14 NOTE — Telephone Encounter (Signed)
Please advise refill?  LOV: 02/24/2021 NOV: None Last refill: 07/10/2021 #60 w/0 refills

## 2021-08-14 NOTE — Telephone Encounter (Signed)
Copied from [place] [id number]. Topic: Quick Communication - Rx Refill/Question >> Aug 14, 2021 12:37 PM Yvette Rack wrote: Medication: HYDROcodone-acetaminophen (NORCO) 10-325 MG tablet  Has the patient contacted their pharmacy? No. (Agent: If no, request that the patient contact the pharmacy for the refill. If patient does not wish to contact the pharmacy document the reason why and proceed with request.) (Agent: If yes, when and what did the pharmacy advise?)  Preferred Pharmacy (with phone number or street name): [id number] - [place], [place]  Phone:  [phone] Fax:  [phone]  Has the patient been seen for an appointment in the last year OR does the patient have an upcoming appointment? Yes.    Agent: Please be advised that RX refills may take up to 3 business days. We ask that you follow-up with your pharmacy.

## 2021-08-14 NOTE — Telephone Encounter (Signed)
Requested medication (s) are due for refill today: yes  Requested medication (s) are on the active medication list: yes  Last refill:  07/10/21 #60/0  Future visit scheduled: no  Notes to clinic:  Unable to refill per protocol, cannot delegate.      Requested Prescriptions  Pending Prescriptions Disp Refills   HYDROcodone-acetaminophen (NORCO) 10-325 MG tablet 60 tablet 0    Sig: Take 1 tablet by mouth every 8 (eight) hours as needed.     Not Delegated - Analgesics:  Opioid Agonist Combinations Failed - 08/14/2021  2:00 PM      Failed - This refill cannot be delegated      Failed - Urine Drug Screen completed in last 360 days      Passed - Valid encounter within last 6 months    Recent Outpatient Visits           5 months ago Primary insomnia   [facility] Birdie Sons, MD   11 months ago No-show for appointment   [facility] Flinchum, Kelby Aline, FNP   1 year ago Depression, major, single episode, moderate [facility])   [facility] Birdie Sons, MD   1 year ago Syncope, unspecified syncope type   [facility] Trinna Post, PA-C   1 year ago Insomnia, unspecified type   [facility] Birdie Sons, MD

## 2021-08-15 ENCOUNTER — Telehealth: Payer: Self-pay

## 2021-08-15 MED ORDER — HYDROCODONE-ACETAMINOPHEN 10-325 MG PO TABS: 1.0000 | ORAL_TABLET | Freq: Three times a day (TID) | ORAL | 0 refills | Status: DC | PRN

## 2021-08-15 NOTE — Telephone Encounter (Signed)
It is Dr Dossie Arbour policy and he has not been here.  He can make a virtual visit with Dr Dossie Arbour. Please schedule.

## 2021-08-15 NOTE — Telephone Encounter (Signed)
The patient called in and said he comes in every year for a procedure with Dr. Dossie Arbour and he wants to schedule one now. I told him we would need to schedule an eval, a VV at the least, but he said he has never had to do that before, that Dr. Dossie Arbour lets him come in for a procedure. He was last seen in July 2021. Can you let me know how I need to proceed with this?
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
## 2021-08-18 ENCOUNTER — Telehealth: Payer: PPO | Admitting: Pain Medicine

## 2021-08-18 ENCOUNTER — Ambulatory Visit (INDEPENDENT_AMBULATORY_CARE_PROVIDER_SITE_OTHER): Payer: PPO

## 2021-08-18 DIAGNOSIS — Z95810 Presence of automatic (implantable) cardiac defibrillator: Secondary | ICD-10-CM

## 2021-08-18 DIAGNOSIS — I5022 Chronic systolic (congestive) heart failure: Secondary | ICD-10-CM

## 2021-08-18 NOTE — Progress Notes (Signed)
Remote ICD transmission.   
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
## 2021-08-20 ENCOUNTER — Ambulatory Visit: Payer: PPO | Attending: Pain Medicine | Admitting: Pain Medicine

## 2021-08-20 ENCOUNTER — Other Ambulatory Visit: Payer: Self-pay

## 2021-08-20 DIAGNOSIS — M545 Low back pain, unspecified: Secondary | ICD-10-CM

## 2021-08-20 DIAGNOSIS — G8929 Other chronic pain: Secondary | ICD-10-CM | POA: Insufficient documentation

## 2021-08-20 DIAGNOSIS — M47817 Spondylosis without myelopathy or radiculopathy, lumbosacral region: Secondary | ICD-10-CM | POA: Diagnosis not present

## 2021-08-20 DIAGNOSIS — M47816 Spondylosis without myelopathy or radiculopathy, lumbar region: Secondary | ICD-10-CM | POA: Diagnosis not present

## 2021-08-20 DIAGNOSIS — R937 Abnormal findings on diagnostic imaging of other parts of musculoskeletal system: Secondary | ICD-10-CM | POA: Diagnosis not present

## 2021-08-20 DIAGNOSIS — M62830 Muscle spasm of back: Secondary | ICD-10-CM | POA: Diagnosis not present

## 2021-08-20 NOTE — Patient Instructions (Signed)
______________________________________________________________________  Preparing for your procedure (without sedation)  Procedure appointments are limited to planned procedures: No Prescription Refills. No disability issues will be discussed. No medication changes will be discussed.  Instructions: Oral Intake: Do not eat or drink anything for at least 6 hours prior to your procedure. (Exception: Blood Pressure Medication. See below.) Transportation: Unless otherwise stated by your physician, you may drive yourself after the procedure. Blood Pressure Medicine: Do not forget to take your blood pressure medicine with a sip of water the morning of the procedure. If your Diastolic (lower reading)is above 100 mmHg, elective cases will be cancelled/rescheduled. Blood thinners: These will need to be stopped for procedures. Notify our staff if you are taking any blood thinners. Depending on which one you take, there will be specific instructions on how and when to stop it. Diabetics on insulin: Notify the staff so that you can be scheduled 1st case in the morning. If your diabetes requires high dose insulin, take only  of your normal insulin dose the morning of the procedure and notify the staff that you have done so. Preventing infections: Shower with an antibacterial soap the morning of your procedure.  Build-up your immune system: Take 1000 mg of Vitamin C with every meal (3 times a day) the day prior to your procedure. Antibiotics: Inform the staff if you have a condition or reason that requires you to take antibiotics before dental procedures. Pregnancy: If you are pregnant, call and cancel the procedure. Sickness: If you have a cold, fever, or any active infections, call and cancel the procedure. Arrival: You must be in the facility at least 30 minutes prior to your scheduled procedure. Children: Do not bring any children with you. Dress appropriately: Bring dark clothing that you would not mind  if they get stained. Valuables: Do not bring any jewelry or valuables.  Reasons to call and reschedule or cancel your procedure: (Following these recommendations will minimize the risk of a serious complication.) Surgeries: Avoid having procedures within 2 weeks of any surgery. (Avoid for 2 weeks before or after any surgery). Flu Shots: Avoid having procedures within 2 weeks of a flu shots or . (Avoid for 2 weeks before or after immunizations). Barium: Avoid having a procedure within 7-10 days after having had a radiological study involving the use of radiological contrast. (Myelograms, Barium swallow or enema study). Heart attacks: Avoid any elective procedures or surgeries for the initial 6 months after a "Myocardial Infarction" (Heart Attack). Blood thinners: It is imperative that you stop these medications before procedures. Let us know if you if you take any blood thinner.  Infection: Avoid procedures during or within two weeks of an infection (including chest colds or gastrointestinal problems). Symptoms associated with infections include: Localized redness, fever, chills, night sweats or profuse sweating, burning sensation when voiding, cough, congestion, stuffiness, runny nose, sore throat, diarrhea, nausea, vomiting, cold or Flu symptoms, recent or current infections. It is specially important if the infection is over the area that we intend to treat. Heart and lung problems: Symptoms that may suggest an active cardiopulmonary problem include: cough, chest pain, breathing difficulties or shortness of breath, dizziness, ankle swelling, uncontrolled high or unusually low blood pressure, and/or palpitations. If you are experiencing any of these symptoms, cancel your procedure and contact your primary care physician for an evaluation.  Remember:  Regular Business hours are:  Monday to Thursday 8:00 AM to 4:00 PM  Provider's Schedule: Kirsty Monjaraz, MD:  Procedure days: Tuesday and Thursday    7:30 AM to 4:00 PM  Bilal Lateef, MD:  Procedure days: Monday and Wednesday 7:30 AM to 4:00 PM ______________________________________________________________________  ____________________________________________________________________________________________  General Risks and Possible Complications  Patient Responsibilities: It is important that you read this as it is part of your informed consent. It is our duty to inform you of the risks and possible complications associated with treatments offered to you. It is your responsibility as a patient to read this and to ask questions about anything that is not clear or that you believe was not covered in this document.  Patient's Rights: You have the right to refuse treatment. You also have the right to change your mind, even after initially having agreed to have the treatment done. However, under this last option, if you wait until the last second to change your mind, you may be charged for the materials used up to that point.  Introduction: Medicine is not an exact science. Everything in Medicine, including the lack of treatment(s), carries the potential for danger, harm, or loss (which is by definition: Risk). In Medicine, a complication is a secondary problem, condition, or disease that can aggravate an already existing one. All treatments carry the risk of possible complications. The fact that a side effects or complications occurs, does not imply that the treatment was conducted incorrectly. It must be clearly understood that these can happen even when everything is done following the highest safety standards.  No treatment: You can choose not to proceed with the proposed treatment alternative. The "PRO(s)" would include: avoiding the risk of complications associated with the therapy. The "CON(s)" would include: not getting any of the treatment benefits. These benefits fall under one of three categories: diagnostic; therapeutic; and/or palliative.  Diagnostic benefits include: getting information which can ultimately lead to improvement of the disease or symptom(s). Therapeutic benefits are those associated with the successful treatment of the disease. Finally, palliative benefits are those related to the decrease of the primary symptoms, without necessarily curing the condition (example: decreasing the pain from a flare-up of a chronic condition, such as incurable terminal cancer).  General Risks and Complications: These are associated to most interventional treatments. They can occur alone, or in combination. They fall under one of the following six (6) categories: no benefit or worsening of symptoms; bleeding; infection; nerve damage; allergic reactions; and/or death. No benefits or worsening of symptoms: In Medicine there are no guarantees, only probabilities. No healthcare provider can ever guarantee that a medical treatment will work, they can only state the probability that it may. Furthermore, there is always the possibility that the condition may worsen, either directly, or indirectly, as a consequence of the treatment. Bleeding: This is more common if the patient is taking a blood thinner, either prescription or over the counter (example: Goody Powders, Fish oil, Aspirin, Garlic, etc.), or if suffering a condition associated with impaired coagulation (example: Hemophilia, cirrhosis of the liver, low platelet counts, etc.). However, even if you do not have one on these, it can still happen. If you have any of these conditions, or take one of these drugs, make sure to notify your treating physician. Infection: This is more common in patients with a compromised immune system, either due to disease (example: diabetes, cancer, human immunodeficiency virus [HIV], etc.), or due to medications or treatments (example: therapies used to treat cancer and rheumatological diseases). However, even if you do not have one on these, it can still happen. If you  have any of these conditions, or take   one of these drugs, make sure to notify your treating physician. Nerve Damage: This is more common when the treatment is an invasive one, but it can also happen with the use of medications, such as those used in the treatment of cancer. The damage can occur to small secondary nerves, or to large primary ones, such as those in the spinal cord and brain. This damage may be temporary or permanent and it may lead to impairments that can range from temporary numbness to permanent paralysis and/or brain death. Allergic Reactions: Any time a substance or material comes in contact with our body, there is the possibility of an allergic reaction. These can range from a mild skin rash (contact dermatitis) to a severe systemic reaction (anaphylactic reaction), which can result in death. Death: In general, any medical intervention can result in death, most of the time due to an unforeseen complication. ____________________________________________________________________________________________  

## 2021-08-20 NOTE — Progress Notes (Signed)
EPIC Encounter for ICM Monitoring  Patient Name: Richard Cook is a 66 y.o. male Date: 08/20/2021 Primary Care Physican: Birdie Sons, MD Primary Cardiologist: [name] Electrophysiologist: [name] 07/17/2021 Office Weight: 175 lbs    Transmission reviewed.    Optivol thoracic impedance normal but intermittent days in last month with possible fluid accumulation.   Prescribed: Furosemide 40 mg take 2 tablets (80 mg total) by mouth daily.   Labs: 05/28/2021 Creatinine 2.84, BUN 33, Potassium 3.1, Sodium 135, GFR 24 05/16/2021 Creatinine 3.33, BUN 35, Potassium 3.5, Sodium 138, GFR 20  04/26/2021 Creatinine 2.24, BUN 43, Potassium 4.2, Sodium 139, GFR 20  02/24/2021 Creatinine 3.44, BUN 36, Potassium 5.2, Sodium 144, GFR 19 11/11/2020 Creatinine 4.84, BUN 48, Potassium 3.8, Sodium 142, GFR 13  A complete set of results can be found in Results Review.   Recommendations:  No changes.   Follow-up plan: ICM clinic phone appointment on 09/22/2021.   91 day device clinic remote transmission 11/07/2021.     EP/Cardiology Office Visits:  11/18/2021 with Dr. [name].     Copy of ICM check sent to Dr. [name].   3 month ICM trend: 08/18/2021.    12-14 Month ICM trend:     Rosalene Billings, RN 08/20/2021 5:05 PM

## 2021-08-26 ENCOUNTER — Ambulatory Visit (HOSPITAL_BASED_OUTPATIENT_CLINIC_OR_DEPARTMENT_OTHER): Payer: PPO | Admitting: Pain Medicine

## 2021-08-26 ENCOUNTER — Other Ambulatory Visit: Payer: Self-pay

## 2021-08-26 ENCOUNTER — Ambulatory Visit
Admission: RE | Admit: 2021-08-26 | Discharge: 2021-08-26 | Disposition: A | Discharge status: Home / Self Care | Payer: PPO | Source: Ambulatory Visit | Attending: Pain Medicine | Admitting: Pain Medicine

## 2021-08-26 ENCOUNTER — Encounter: Payer: Self-pay | Admitting: Pain Medicine

## 2021-08-26 VITALS — BP 120/87 | HR 67 | Temp 97.2°F | Resp 11 | Ht 70.0 in | Wt 168.0 lb

## 2021-08-26 DIAGNOSIS — M545 Low back pain, unspecified: Secondary | ICD-10-CM

## 2021-08-26 DIAGNOSIS — M47816 Spondylosis without myelopathy or radiculopathy, lumbar region: Secondary | ICD-10-CM

## 2021-08-26 DIAGNOSIS — M5137 Other intervertebral disc degeneration, lumbosacral region: Secondary | ICD-10-CM | POA: Insufficient documentation

## 2021-08-26 DIAGNOSIS — R937 Abnormal findings on diagnostic imaging of other parts of musculoskeletal system: Secondary | ICD-10-CM | POA: Insufficient documentation

## 2021-08-26 DIAGNOSIS — G8929 Other chronic pain: Secondary | ICD-10-CM | POA: Insufficient documentation

## 2021-08-26 DIAGNOSIS — M4310 Spondylolisthesis, site unspecified: Secondary | ICD-10-CM | POA: Diagnosis not present

## 2021-08-26 DIAGNOSIS — M47817 Spondylosis without myelopathy or radiculopathy, lumbosacral region: Secondary | ICD-10-CM | POA: Insufficient documentation

## 2021-08-26 MED ORDER — ROPIVACAINE HCL 2 MG/ML IJ SOLN
9.0000 mL | Freq: Once | INTRAMUSCULAR | Status: AC
  Administered 2021-08-26: 10:00:00 20 mL via PERINEURAL

## 2021-08-26 MED ORDER — ROPIVACAINE HCL 2 MG/ML IJ SOLN
INTRAMUSCULAR | Status: AC
  Filled 2021-08-26: qty 20

## 2021-08-26 MED ORDER — PENTAFLUOROPROP-TETRAFLUOROETH EX AERO
INHALATION_SPRAY | Freq: Once | CUTANEOUS | Status: AC
  Administered 2021-08-26: 30 via TOPICAL
  Filled 2021-08-26: qty 116

## 2021-08-26 MED ORDER — LIDOCAINE HCL 2 % IJ SOLN
20.0000 mL | Freq: Once | INTRAMUSCULAR | Status: AC
  Administered 2021-08-26: 10:00:00 100 mg
  Filled 2021-08-26: qty 20

## 2021-08-26 MED ORDER — MIDAZOLAM HCL 5 MG/5ML IJ SOLN: 0.5000 mg | Freq: Once | INTRAMUSCULAR | Status: DC

## 2021-08-26 MED ORDER — TRIAMCINOLONE ACETONIDE 40 MG/ML IJ SUSP
40.0000 mg | Freq: Once | INTRAMUSCULAR | Status: AC
  Administered 2021-08-26: 10:00:00 40 mg
  Filled 2021-08-26: qty 1

## 2021-08-26 MED ORDER — LACTATED RINGERS IV SOLN: 1000.0000 mL | Freq: Once | INTRAVENOUS | Status: DC

## 2021-08-26 NOTE — Progress Notes (Signed)
PROVIDER NOTE: Interpretation of information contained herein should be left to medically-trained personnel. Specific patient instructions are provided elsewhere under "Patient Instructions" section of medical record. This document was created in part using STT-dictation technology, any transcriptional errors that may result from this process are unintentional.  Patient: Richard Cook Type: Established DOB: November 02, 1955 MRN: [id number] PCP: Birdie Sons, MD  Service: Procedure DOS: 08/26/2021 Setting: Ambulatory Location: Ambulatory outpatient facility Delivery: Face-to-face Provider: Gaspar Cola, MD Specialty: Interventional Pain Management Specialty designation: 09 Location: Outpatient facility Ref. Prov.: Birdie Sons, MD    Primary Reason for Visit: Interventional Pain Management Treatment. CC: Back Pain (lower)   Procedure:           Type: Lumbar Facet, Medial Branch Block(s) #2  Laterality: Right  Level: L2, L3, L4, L5, & S1 Medial Branch Level(s). Injecting these levels blocks the L3-4 and L5-S1 lumbar facet joints. Imaging: Fluoroscopic guidance Anesthesia: Local anesthesia (1-2% Lidocaine) Anxiolysis: None                 Sedation: None. DOS: 08/26/2021 Performed by: Gaspar Cola, MD  Primary Purpose: Diagnostic/Therapeutic Indications: Low back pain severe enough to impact quality of life or function. 1. Lumbar facet syndrome (Bilateral) (L>R)   2. Lumbar facet hypertrophy (Multilevel) (Bilateral)   3. Grade 1 Retrolisthesis of lumbar spine (L3/L4 and L4/L5)   4. Spondylosis without myelopathy or radiculopathy, lumbosacral region   5. DDD (degenerative disc disease), lumbosacral   6. Chronic low back pain (Bilateral) (R>L) w/o sciatica   7. Abnormal CT scan, lumbar spine (11/21/2020)    NAS-11 Pain score:   Pre-procedure: 10-Worst pain ever/10   Post-procedure: 0-No pain/10     Position / Prep / Materials:  Position: Prone  Prep  solution: DuraPrep (Iodine Povacrylex [0.7% available iodine] and Isopropyl Alcohol, 74% w/w) Area Prepped: Posterolateral Lumbosacral Spine (Wide prep: From the lower border of the scapula down to the end of the tailbone and from flank to flank.)  Materials:  Tray: Block Needle(s):  Type: Spinal  Gauge (G): 22  Length: 5-in Qty: 4  Pre-op H&P Assessment:  Richard Cook is a 66 y.o. (year old), male patient, seen today for interventional treatment. He  has a past surgical history that includes Cholecystectomy open (1980's); Skin graft (Left, 1986); Mandible fracture surgery (1986); Vascular surgery (Left, 1986); Tibia fracture surgery (Right, 1986); left heart catheterization with coronary angiogram (N/A, 08/10/2011); percutaneous coronary stent intervention (pci-s) (08/10/2011); left heart catheterization with coronary/graft angiogram (N/A, 10/17/2013); implantable cardioverter defibrillator implant (N/A, 10/18/2013); Split night study (12/19/2015); Fracture surgery; Inguinal hernia repair (Bilateral, ~ 08/2016); Coronary angioplasty with stent (2013); Cardiac catheterization (2014); Ventricular ablation surgery (10/07/2016); Coronary artery bypass graft (2014); V TACH ABLATION (N/A, 10/07/2016); LEFT HEART CATH AND CORS/GRAFTS ANGIOGRAPHY (N/A, 11/17/2017); V TACH ABLATION (N/A, 12/08/2017); IR Radiologist Eval & Mgmt (05/22/2021); IR US Guide Vasc Access Right (05/29/2021); IR Angiogram Visceral Selective (05/29/2021); and IR INTRAVASCULAR ULTRASOUND NON CORONARY (05/29/2021). Richard Cook has a current medication list which includes the following prescription(s): aspirin ec, bisoprolol, cholecalciferol, clonazepam, furosemide, hydrocodone-acetaminophen, ibuprofen, levothyroxine, mexiletine, and fish oil, and the following Facility-Administered Medications: betamethasone acetate-betamethasone sodium phosphate, lactated ringers, and midazolam. His primarily concern today is the Back Pain (lower)  Initial Vital  Signs:  Pulse/HCG Rate: 67ECG Heart Rate: (!) 59 Temp: (!) 97.2 F (36.2 C) Resp: 16 BP: 115/78 SpO2: 98 %  BMI: Estimated body mass index is 24.11 kg/m as calculated from the following:   Height  as of this encounter: 5\' 10"  (1.778 m).   Weight as of this encounter: 168 lb (76.2 kg).  Risk Assessment: Allergies: Reviewed. He is allergic to eggs or egg-derived products and colchicine.  Allergy Precautions: None required Coagulopathies: Reviewed. None identified.  Blood-thinner therapy: None at this time Active Infection(s): Reviewed. None identified. Richard Cook is afebrile  Site Confirmation: Richard Cook was asked to confirm the procedure and laterality before marking the site Procedure checklist: Completed Consent: Before the procedure and under the influence of no sedative(s), amnesic(s), or anxiolytics, the patient was informed of the treatment options, risks and possible complications. To fulfill our ethical and legal obligations, as recommended by the American Medical Association's Code of Ethics, I have informed the patient of my clinical impression; the nature and purpose of the treatment or procedure; the risks, benefits, and possible complications of the intervention; the alternatives, including doing nothing; the risk(s) and benefit(s) of the alternative treatment(s) or procedure(s); and the risk(s) and benefit(s) of doing nothing. The patient was provided information about the general risks and possible complications associated with the procedure. These may include, but are not limited to: failure to achieve desired goals, infection, bleeding, organ or nerve damage, allergic reactions, paralysis, and death. In addition, the patient was informed of those risks and complications associated to Spine-related procedures, such as failure to decrease pain; infection (i.e.: Meningitis, epidural or intraspinal abscess); bleeding (i.e.: epidural hematoma, subarachnoid hemorrhage, or any  other type of intraspinal or peri-dural bleeding); organ or nerve damage (i.e.: Any type of peripheral nerve, nerve root, or spinal cord injury) with subsequent damage to sensory, motor, and/or autonomic systems, resulting in permanent pain, numbness, and/or weakness of one or several areas of the body; allergic reactions; (i.e.: anaphylactic reaction); and/or death. Furthermore, the patient was informed of those risks and complications associated with the medications. These include, but are not limited to: allergic reactions (i.e.: anaphylactic or anaphylactoid reaction(s)); adrenal axis suppression; blood sugar elevation that in diabetics may result in ketoacidosis or comma; water retention that in patients with history of congestive heart failure may result in shortness of breath, pulmonary edema, and decompensation with resultant heart failure; weight gain; swelling or edema; medication-induced neural toxicity; particulate matter embolism and blood vessel occlusion with resultant organ, and/or nervous system infarction; and/or aseptic necrosis of one or more joints. Finally, the patient was informed that Medicine is not an exact science; therefore, there is also the possibility of unforeseen or unpredictable risks and/or possible complications that may result in a catastrophic outcome. The patient indicated having understood very clearly. We have given the patient no guarantees and we have made no promises. Enough time was given to the patient to ask questions, all of which were answered to the patient's satisfaction. Richard Cook has indicated that he wanted to continue with the procedure. Attestation: I, the ordering provider, attest that I have discussed with the patient the benefits, risks, side-effects, alternatives, likelihood of achieving goals, and potential problems during recovery for the procedure that I have provided informed consent. Date   Time: 08/26/2021  9:42 AM  Pre-Procedure Preparation:   Monitoring: As per clinic protocol. Respiration, ETCO2, SpO2, BP, heart rate and rhythm monitor placed and checked for adequate function Safety Precautions: Patient was assessed for positional comfort and pressure points before starting the procedure. Time-out: I initiated and conducted the "Time-out" before starting the procedure, as per protocol. The patient was asked to participate by confirming the accuracy of the "Time Out" information. Verification of  the correct person, site, and procedure were performed and confirmed by me, the nursing staff, and the patient. "Time-out" conducted as per Joint Commission's Universal Protocol (UP.01.01.01). Time: 1038  Description of Procedure:          Laterality: Right Levels:  L2, L3, L4, L5, & S1 Medial Branch Level(s)  Safety Precautions: Aspiration looking for blood return was conducted prior to all injections. At no point did we inject any substances, as a needle was being advanced. Before injecting, the patient was told to immediately notify me if he was experiencing any new onset of "ringing in the ears, or metallic taste in the mouth". No attempts were made at seeking any paresthesias. Safe injection practices and needle disposal techniques used. Medications properly checked for expiration dates. SDV (single dose vial) medications used. After the completion of the procedure, all disposable equipment used was discarded in the proper designated medical waste containers. Local Anesthesia: Protocol guidelines were followed. The patient was positioned over the fluoroscopy table. The area was prepped in the usual manner. The time-out was completed. The target area was identified using fluoroscopy. A 12-in long, straight, sterile hemostat was used with fluoroscopic guidance to locate the targets for each level blocked. Once located, the skin was marked with an approved surgical skin marker. Once all sites were marked, the skin (epidermis, dermis, and  hypodermis), as well as deeper tissues (fat, connective tissue and muscle) were infiltrated with a small amount of a short-acting local anesthetic, loaded on a 10cc syringe with a 25G, 1.5-in  Needle. An appropriate amount of time was allowed for local anesthetics to take effect before proceeding to the next step. Local Anesthetic: Lidocaine 2.0% The unused portion of the local anesthetic was discarded in the proper designated containers. Technical explanation of process:  L2 Medial Branch Nerve Block (MBB): The target area for the L2 medial branch is at the junction of the postero-lateral aspect of the superior articular process and the superior, posterior, and medial edge of the transverse process of L3. Under fluoroscopic guidance, a Quincke needle was inserted until contact was made with os over the superior postero-lateral aspect of the pedicular shadow (target area). After negative aspiration for blood, 0.5 mL of the nerve block solution was injected without difficulty or complication. The needle was removed intact. L3 Medial Branch Nerve Block (MBB): The target area for the L3 medial branch is at the junction of the postero-lateral aspect of the superior articular process and the superior, posterior, and medial edge of the transverse process of L4. Under fluoroscopic guidance, a Quincke needle was inserted until contact was made with os over the superior postero-lateral aspect of the pedicular shadow (target area). After negative aspiration for blood, 0.5 mL of the nerve block solution was injected without difficulty or complication. The needle was removed intact. L4 Medial Branch Nerve Block (MBB): The target area for the L4 medial branch is at the junction of the postero-lateral aspect of the superior articular process and the superior, posterior, and medial edge of the transverse process of L5. Under fluoroscopic guidance, a Quincke needle was inserted until contact was made with os over the superior  postero-lateral aspect of the pedicular shadow (target area). After negative aspiration for blood, 0.5 mL of the nerve block solution was injected without difficulty or complication. The needle was removed intact. L5 Medial Branch Nerve Block (MBB): The target area for the L5 medial branch is at the junction of the postero-lateral aspect of the superior articular process  and the superior, posterior, and medial edge of the sacral ala. Under fluoroscopic guidance, a Quincke needle was inserted until contact was made with os over the superior postero-lateral aspect of the pedicular shadow (target area). After negative aspiration for blood, 0.5 mL of the nerve block solution was injected without difficulty or complication. The needle was removed intact. S1 Medial Branch Nerve Block (MBB): The target area for the S1 medial branch is at the posterior and inferior 6 o'clock position of the L5-S1 facet joint. Under fluoroscopic guidance, the Quincke needle inserted for the L5 MBB was redirected until contact was made with os over the inferior and postero aspect of the sacrum, at the 6 o' clock position under the L5-S1 facet joint (Target area). After negative aspiration for blood, 0.5 mL of the nerve block solution was injected without difficulty or complication. The needle was removed intact.  Once the entire procedure was completed, the treated area was cleaned, making sure to leave some of the prepping solution back to take advantage of its long term bactericidal properties.      Illustration of the posterior view of the lumbar spine and the posterior neural structures. Laminae of L2 through S1 are labeled. DPRL5, dorsal primary ramus of L5; DPRS1, dorsal primary ramus of S1; DPR3, dorsal primary ramus of L3; FJ, facet (zygapophyseal) joint L3-L4; I, inferior articular process of L4; LB1, lateral branch of dorsal primary ramus of L1; IAB, inferior articular branches from L3 medial branch (supplies L4-L5 facet  joint); IBP, intermediate branch plexus; MB3, medial branch of dorsal primary ramus of L3; NR3, third lumbar nerve root; S, superior articular process of L5; SAB, superior articular branches from L4 (supplies L4-5 facet joint also); TP3, transverse process of L3.  Vitals:   08/26/21 0941 08/26/21 1040 08/26/21 1044  BP: 115/78 119/82 120/87  Pulse: 67    Resp: 16 11 11   Temp: (!) 97.2 F (36.2 C)    SpO2: 98% 96% 100%  Weight: 168 lb (76.2 kg)    Height: 5\' 10"  (1.778 m)       Start Time: 1038 hrs. End Time: 1039 hrs.  Imaging Guidance (Spinal):          Type of Imaging Technique: Fluoroscopy Guidance (Spinal) Indication(s): Assistance in needle guidance and placement for procedures requiring needle placement in or near specific anatomical locations not easily accessible without such assistance. Exposure Time: Please see nurses notes. Contrast: None used. Fluoroscopic Guidance: I was personally present during the use of fluoroscopy. "Tunnel Vision Technique" used to obtain the best possible view of the target area. Parallax error corrected before commencing the procedure. "Direction-depth-direction" technique used to introduce the needle under continuous pulsed fluoroscopy. Once target was reached, antero-posterior, oblique, and lateral fluoroscopic projection used confirm needle placement in all planes. Images permanently stored in EMR. Interpretation: No contrast injected. I personally interpreted the imaging intraoperatively. Adequate needle placement confirmed in multiple planes. Permanent images saved into the patient's record.  Antibiotic Prophylaxis:   Anti-infectives (From admission, onward)    None      Indication(s): None identified  Post-operative Assessment:  Post-procedure Vital Signs:  Pulse/HCG Rate: 67(!) 59 Temp: (!) 97.2 F (36.2 C) Resp: 11 BP: 120/87 SpO2: 100 %  EBL: None  Complications: No immediate post-treatment complications observed by team, or  reported by patient.  Note: The patient tolerated the entire procedure well. A repeat set of vitals were taken after the procedure and the patient was kept under observation following institutional policy,  for this type of procedure. Post-procedural neurological assessment was performed, showing return to baseline, prior to discharge. The patient was provided with post-procedure discharge instructions, including a section on how to identify potential problems. Should any problems arise concerning this procedure, the patient was given instructions to immediately contact us, at any time, without hesitation. In any case, we plan to contact the patient by telephone for a follow-up status report regarding this interventional procedure.  Comments:  No additional relevant information.  Plan of Care  Orders:  Orders Placed This Encounter  Procedures   LUMBAR FACET(MEDIAL BRANCH NERVE BLOCK) MBNB    Scheduling Instructions:     Procedure: Lumbar facet block (AKA.: Lumbosacral medial branch nerve block)     Side: Right-sided     Level: L3-4 & L5-S1 Facets (L2, L3, L4, L5, & S1 Medial Branch Nerves)     Sedation: Patient's choice.     Timeframe: Today    Order Specific Question:   Where will this procedure be performed?    Answer:   [facility]   DG [facility] C-ARM 1-60 MIN NO REPORT    Intraoperative interpretation by procedural physician at [place].    Standing Status:   Standing    Number of Occurrences:   1    Order Specific Question:   Reason for exam:    Answer:   Assistance in needle guidance and placement for procedures requiring needle placement in or near specific anatomical locations not easily accessible without such assistance.   Informed Consent Details: Physician/Practitioner Attestation; Transcribe to consent form and obtain patient signature    Nursing Order: Transcribe to consent form and obtain patient signature. Note: Always confirm laterality of pain  with Richard Cook, before procedure.    Order Specific Question:   Physician/Practitioner attestation of informed consent for procedure/surgical case    Answer:   I, the physician/practitioner, attest that I have discussed with the patient the benefits, risks, side effects, alternatives, likelihood of achieving goals and potential problems during recovery for the procedure that I have provided informed consent.    Order Specific Question:   Procedure    Answer:   Lumbar Facet Block  under fluoroscopic guidance    Order Specific Question:   Physician/Practitioner performing the procedure    Answer:   Mauriana Dann A. Dossie Arbour MD    Order Specific Question:   Indication/Reason    Answer:   Low Back Pain, with our without leg pain, due to Facet Joint Arthralgia (Joint Pain) Spondylosis (Arthritis of the Spine), without myelopathy or radiculopathy (Nerve Damage).   Provide equipment / supplies at bedside    "Block Tray" (Disposable   single use) Needle type: SpinalSpinal Amount/quantity: 4 Size: Regular (3.5-inch) Gauge: 22G    Standing Status:   Standing    Number of Occurrences:   1    Order Specific Question:   Specify    Answer:   Block Tray   Chronic Opioid Analgesic:  No opioid analgesics prescribed by our practice.   Medications ordered for procedure: Meds ordered this encounter  Medications   lidocaine (XYLOCAINE) 2 % (with pres) injection 400 mg   pentafluoroprop-tetrafluoroeth (GEBAUERS) aerosol   lactated ringers infusion 1,000 mL   midazolam (VERSED) 5 MG/5ML injection 0.5-2 mg    Make sure Flumazenil is available in the pyxis when using this medication. If oversedation occurs, administer 0.2 mg IV over 15 sec. If after 45 sec no response, administer 0.2 mg again over 1 min;  may repeat at 1 min intervals; not to exceed 4 doses (1 mg)   ropivacaine (PF) 2 mg/mL (0.2%) (NAROPIN) injection 9 mL   triamcinolone acetonide (KENALOG-40) injection 40 mg   Medications administered: We  administered lidocaine, pentafluoroprop-tetrafluoroeth, ropivacaine (PF) 2 mg/mL (0.2%), and triamcinolone acetonide.  See the medical record for exact dosing, route, and time of administration.  Follow-up plan:   Return in about 2 weeks (around 09/09/2021) for Proc-day (T,Th), (VV), (PPE).       Interventional Therapies  Risk   Complexity Considerations:   Estimated body mass index is 25.11 kg/m as calculated from the following:   Height as of 07/17/21: 5\' 10"  (1.778 m).   Weight as of 07/17/21: 175 lb (79.4 kg). WNL   Planned   Pending:   Diagnostic/therapeutic right lumbar facet MBB #2    Under consideration:   Palliative right lumbar facet MBB    Completed:   Diagnostic right lumbar facet MBB x1 (02/06/2020) (100/100/100/100 x 540 days)  Therapeutic left L4-5 LESI x1 (01/30/2016) (100/100/50/100)  Therapeutic right L4-5 LESI x1 (06/15/2019) (n/a) Therapeutic right T11-12 LESI x1 (03/29/2018) (100/100/100 x5 weeks/90-100)  Diagnostic/therapeutic left PSIS TPI/MNB x1 (01/30/2020) (100/100/85-90/90-100 x < 1 mo) (not as effective)   Therapeutic   Palliative (PRN) options:   Therapeutic/palliative right lumbar facet MBB      Recent Visits Date Type Provider Dept  08/20/21 Office Visit Milinda Pointer, MD [facility]  Showing recent visits within past 90 days and meeting all other requirements Today's Visits Date Type Provider Dept  08/26/21 Procedure visit Milinda Pointer, MD [facility]  Showing today's visits and meeting all other requirements Future Appointments Date Type Provider Dept  09/09/21 Appointment Milinda Pointer, MD [facility]  Showing future appointments within next 90 days and meeting all other requirements  Disposition: Discharge home  Discharge (Date   Time): 08/26/2021; 1055 hrs.   Primary Care Physician: Birdie Sons, MD Location: [facility] Note by: Gaspar Cola, MD Date:  08/26/2021; Time: 1:34 PM  Disclaimer:  Medicine is not an Chief Strategy Officer. The only guarantee in medicine is that nothing is guaranteed. It is important to note that the decision to proceed with this intervention was based on the information collected from the patient. The Data and conclusions were drawn from the patient's questionnaire, the interview, and the physical examination. Because the information was provided in large part by the patient, it cannot be guaranteed that it has not been purposely or unconsciously manipulated. Every effort has been made to obtain as much relevant data as possible for this evaluation. It is important to note that the conclusions that lead to this procedure are derived in large part from the available data. Always take into account that the treatment will also be dependent on availability of resources and existing treatment guidelines, considered by other Pain Management Practitioners as being common knowledge and practice, at the time of the intervention. For Medico-Legal purposes, it is also important to point out that variation in procedural techniques and pharmacological choices are the acceptable norm. The indications, contraindications, technique, and results of the above procedure should only be interpreted and judged by a Board-Certified Interventional Pain Specialist with extensive familiarity and expertise in the same exact procedure and technique.

## 2021-08-26 NOTE — Patient Instructions (Addendum)
____________________________________________________________________________________________  Virtual Visits   What is a "Virtual Visit"? It is a Metallurgist (medical visit) that takes place on real time (NOT TEXT or E-MAIL) over the telephone or computer device (desktop, laptop, tablet, smart phone, etc.). It allows for more location flexibility between the patient and the healthcare provider.  Who decides when these types of visits will be used? The physician.  Who is eligible for these types of visits? Only those patients that can be reliably reached over the telephone.  What do you mean by reliably? We do not have time to call everyone multiple times, therefore those that tend to screen calls and then call back later are not suitable candidates for this system. We understand how people are reluctant to pickup on "unknown" calls, therefore, we suggest adding our telephone numbers to your list of "CONTACT(s)". This way, you should be able to readily identify our calls when you receive one. All of our numbers are available below.   Who is not eligible? This option is not available for medication management encounters, specially for controlled substances. Patients on pain medications that fall under the category of controlled substances have to come in for "Face-to-Face" encounters. This is required for mandatory monitoring of these substances. You may be asked to provide a sample for an unannounced urine drug screening test (UDS), and we will need to count your pain pills. Not bringing your pills to be counted may result in no refill. Obviously, neither one of these can be done over the phone.  When will this type of visits be used? You can request a virtual visit whenever you are physically unable to attend a regular appointment. The decision will be made by the physician (or healthcare provider) on a case by case basis.   At what time will I be called? This is an  excellent question. The providers will try to call you whenever they have time available. Do not expect to be called at any specific time. The secretaries will assign you a time for your virtual visit appointment, but this is done simply to keep a list of those patients that need to be called, but not for the purpose of keeping a time schedule. Be advised that the call may come in anytime during the day, between the hours of 8:00 AM and 8::00 PM, depending on provider availability. We do understand that the system is not perfect. If you are unable to be available that day on a moments notice, then request an "in-person" appointment rather than a "virtual visit".  Can I request my medication visits to be "Virtual"? Yes you may request it, but the decision is entirely up to the healthcare provider. Control substances require specific monitoring that requires Face-to-Face encounters. The number of encounters  and the extent of the monitoring is determined on a case by case basis.  Add a new contact to your smart phone and label it "PAIN CLINIC" Under this contact add the following numbers: Main: [phone] (Official Contact Number) Nurses: [phone] (These are outgoing only calling systems. Do not call this number.) Dr. Dossie Arbour: [phone] or [phone] (Outgoing calls only. Do not call this number.)  ____________________________________________________________________________________________  ____________________________________________________________________________________________  Post-Procedure Discharge Instructions  Instructions: Apply ice:  Purpose: This will minimize any swelling and discomfort after procedure.  When: Day of procedure, as soon as you get home. How: Fill a plastic sandwich bag with crushed ice. Cover it with a small towel and apply to injection  site. How long: (15 min on, 15 min off) Apply for 15 minutes then remove x 15 minutes.  Repeat sequence on day of  procedure, until you go to bed. Apply heat:  Purpose: To treat any soreness and discomfort from the procedure. When: Starting the next day after the procedure. How: Apply heat to procedure site starting the day following the procedure. How long: May continue to repeat daily, until discomfort goes away. Food intake: Start with clear liquids (like water) and advance to regular food, as tolerated.  Physical activities: Keep activities to a minimum for the first 8 hours after the procedure. After that, then as tolerated. Driving: If you have received any sedation, be responsible and do not drive. You are not allowed to drive for 24 hours after having sedation. Blood thinner: (Applies only to those taking blood thinners) You may restart your blood thinner 6 hours after your procedure. Insulin: (Applies only to Diabetic patients taking insulin) As soon as you can eat, you may resume your normal dosing schedule. Infection prevention: Keep procedure site clean and dry. Shower daily and clean area with soap and water. Post-procedure Pain Diary: Extremely important that this be done correctly and accurately. Recorded information will be used to determine the next step in treatment. For the purpose of accuracy, follow these rules: Evaluate only the area treated. Do not report or include pain from an untreated area. For the purpose of this evaluation, ignore all other areas of pain, except for the treated area. After your procedure, avoid taking a long nap and attempting to complete the pain diary after you wake up. Instead, set your alarm clock to go off every hour, on the hour, for the initial 8 hours after the procedure. Document the duration of the numbing medicine, and the relief you are getting from it. Do not go to sleep and attempt to complete it later. It will not be accurate. If you received sedation, it is likely that you were given a medication that may cause amnesia. Because of this, completing the  diary at a later time may cause the information to be inaccurate. This information is needed to plan your care. Follow-up appointment: Keep your post-procedure follow-up evaluation appointment after the procedure (usually 2 weeks for most procedures, 6 weeks for radiofrequencies). DO NOT FORGET to bring you pain diary with you.   Expect: (What should I expect to see with my procedure?) From numbing medicine (AKA: Local Anesthetics): Numbness or decrease in pain. You may also experience some weakness, which if present, could last for the duration of the local anesthetic. Onset: Full effect within 15 minutes of injected. Duration: It will depend on the type of local anesthetic used. On the average, 1 to 8 hours.  From steroids (Applies only if steroids were used): Decrease in swelling or inflammation. Once inflammation is improved, relief of the pain will follow. Onset of benefits: Depends on the amount of swelling present. The more swelling, the longer it will take for the benefits to be seen. In some cases, up to 10 days. Duration: Steroids will stay in the system x 2 weeks. Duration of benefits will depend on multiple posibilities including persistent irritating factors. Side-effects: If present, they may typically last 2 weeks (the duration of the steroids). Frequent: Cramps (if they occur, drink Gatorade and take over-the-counter Magnesium 450-500 mg once to twice a day); water retention with temporary weight gain; increases in blood sugar; decreased immune system response; increased appetite. Occasional: Facial flushing (red, warm  cheeks); mood swings; menstrual changes. Uncommon: Long-term decrease or suppression of natural hormones; bone thinning. (These are more common with higher doses or more frequent use. This is why we prefer that our patients avoid having any injection therapies in other practices.)  Very Rare: Severe mood changes; psychosis; aseptic necrosis. From procedure: Some  discomfort is to be expected once the numbing medicine wears off. This should be minimal if ice and heat are applied as instructed.  Call if: (When should I call?) You experience numbness and weakness that gets worse with time, as opposed to wearing off. New onset bowel or bladder incontinence. (Applies only to procedures done in the spine)  Emergency Numbers: Durning business hours (Monday - Thursday, 8:00 AM - 4:00 PM) (Friday, 9:00 AM - 12:00 Noon): [phone] After hours: [phone] NOTE: If you are having a problem and are unable connect with, or to talk to a provider, then go to your nearest urgent care or emergency department. If the problem is serious and urgent, please call 911. ____________________________________________________________________________________________   ______________________________________________________________________________________________  Specialty Pain Scale  Introduction:  There are significant differences in how pain is reported. The word pain usually refers to physical pain, but it is also a common synonym of suffering. The medical community uses a scale from 0 (zero) to 10 (ten) to report pain level. Zero (0) is described as "no pain", while ten (10) is described as "the worse pain you can imagine". The problem with this scale is that physical pain is reported along with suffering. Suffering refers to mental pain, or more often yet it refers to any unpleasant feeling, emotion or aversion associated with the perception of harm or threat of harm. It is the psychological component of pain.  Pain Specialists prefer to separate the two components. The pain scale used by this practice is the Verbal Numerical Rating Scale (VNRS-11). This scale is for the physical pain only. DO NOT INCLUDE how your pain psychologically affects you. This scale is for adults 49 years of age and older. It has 11 (eleven) levels. The 1st level is 0/10. This means: "right now, I  have no pain". In the context of pain management, it also means: "right now, my physical pain is under control with the current therapy".  General Information:  The scale should reflect your current level of pain. Unless you are specifically asked for the level of your worst pain, or your average pain. If you are asked for one of these two, then it should be understood that it is over the past 24 hours.  Levels 1 (one) through 5 (five) are described below, and can be treated as an outpatient. Ambulatory pain management facilities such as ours are more than adequate to treat these levels. Levels 6 (six) through 10 (ten) are also described below, however, these must be treated as a hospitalized patient. While levels 6 (six) and 7 (seven) may be evaluated at an urgent care facility, levels 8 (eight) through 10 (ten) constitute medical emergencies and as such, they belong in a hospital's emergency department. When having these levels (as described below), do not come to our office. Our facility is not equipped to manage these levels. Go directly to an urgent care facility or an emergency department to be evaluated.  Definitions:  Activities of Daily Living (ADL): Activities of daily living (ADL or ADLs) is a term used in healthcare to refer to people's daily self-care activities. Health professionals often use a person's ability or inability to  perform ADLs as a measurement of their functional status, particularly in regard to people post injury, with disabilities and the elderly. There are two ADL levels: Basic and Instrumental. Basic Activities of Daily Living (BADL  or BADLs) consist of self-care tasks that include: Bathing and showering; personal hygiene and grooming (including brushing/combing/styling hair); dressing; Toilet hygiene (getting to the toilet, cleaning oneself, and getting back up); eating and self-feeding (not including cooking or chewing and swallowing); functional mobility, often referred  to as "transferring", as measured by the ability to walk, get in and out of bed, and get into and out of a chair; the broader definition (moving from one place to another while performing activities) is useful for people with different physical abilities who are still able to get around independently. Basic ADLs include the things many people do when they get up in the morning and get ready to go out of the house: get out of bed, go to the toilet, bathe, dress, groom, and eat. On the average, loss of function typically follows a particular order. Hygiene is the first to go, followed by loss of toilet use and locomotion. The last to go is the ability to eat. When there is only one remaining area in which the person is independent, there is a 62.9% chance that it is eating and only a 3.5% chance that it is hygiene. Instrumental Activities of Daily Living (IADL or IADLs) are not necessary for fundamental functioning, but they let an individual live independently in a community. IADL consist of tasks that include: cleaning and maintaining the house; home establishment and maintenance; care of others (including selecting and supervising caregivers); care of pets; child rearing; managing money; managing financials (investments, etc.); meal preparation and cleanup; shopping for groceries and necessities; moving within the community; safety procedures and emergency responses; health management and maintenance (taking prescribed medications); and using the telephone or other form of communication.  Instructions:  Most patients tend to report their pain as a combination of two factors, their physical pain and their psychosocial pain. This last one is also known as suffering and it is reflection of how physical pain affects you socially and psychologically. From now on, report them separately.  From this point on, when asked to report your pain level, report only your physical pain. Use the following table for  reference.  Pain Clinic Pain Levels (0-5/10)  Pain Level Score  Description  No Pain 0   Mild pain 1 Nagging, annoying, but does not interfere with basic activities of daily living (ADL). Patients are able to eat, bathe, get dressed, toileting (being able to get on and off the toilet and perform personal hygiene functions), transfer (move in and out of bed or a chair without assistance), and maintain continence (able to control bladder and bowel functions). Blood pressure and heart rate are unaffected. A normal heart rate for a healthy adult ranges from 60 to 100 bpm (beats per minute).   Mild to moderate pain 2 Noticeable and distracting. Impossible to hide from other people. More frequent flare-ups. Still possible to adapt and function close to normal. It can be very annoying and may have occasional stronger flare-ups. With discipline, patients may get used to it and adapt.   Moderate pain 3 Interferes significantly with activities of daily living (ADL). It becomes difficult to feed, bathe, get dressed, get on and off the toilet or to perform personal hygiene functions. Difficult to get in and out of bed or a chair without assistance.  Very distracting. With effort, it can be ignored when deeply involved in activities.   Moderately severe pain 4 Impossible to ignore for more than a few minutes. With effort, patients may still be able to manage work or participate in some social activities. Very difficult to concentrate. Signs of autonomic nervous system discharge are evident: dilated pupils (mydriasis); mild sweating (diaphoresis); sleep interference. Heart rate becomes elevated (>115 bpm). Diastolic blood pressure (lower number) rises above 100 mmHg. Patients find relief in laying down and not moving.   Severe pain 5 Intense and extremely unpleasant. Associated with frowning face and frequent crying. Pain overwhelms the senses.  Ability to do any activity or maintain social relationships becomes  significantly limited. Conversation becomes difficult. Pacing back and forth is common, as getting into a comfortable position is nearly impossible. Pain wakes you up from deep sleep. Physical signs will be obvious: pupillary dilation; increased sweating; goosebumps; brisk reflexes; cold, clammy hands and feet; nausea, vomiting or dry heaves; loss of appetite; significant sleep disturbance with inability to fall asleep or to remain asleep. When persistent, significant weight loss is observed due to the complete loss of appetite and sleep deprivation.  Blood pressure and heart rate becomes significantly elevated. Caution: If elevated blood pressure triggers a pounding headache associated with blurred vision, then the patient should immediately seek attention at an urgent or emergency care unit, as these may be signs of an impending stroke.    Emergency Department Pain Levels (6-10/10)  Emergency Room Pain 6 Severely limiting. Requires emergency care and should not be seen or managed at an outpatient pain management facility. Communication becomes difficult and requires great effort. Assistance to reach the emergency department may be required. Facial flushing and profuse sweating along with potentially dangerous increases in heart rate and blood pressure will be evident.   Distressing pain 7 Self-care is very difficult. Assistance is required to transport, or use restroom. Assistance to reach the emergency department will be required. Tasks requiring coordination, such as bathing and getting dressed become very difficult.   Disabling pain 8 Self-care is no longer possible. At this level, pain is disabling. The individual is unable to do even the most basic activities such as walking, eating, bathing, dressing, transferring to a bed, or toileting. Fine motor skills are lost. It is difficult to think clearly.   Incapacitating pain 9 Pain becomes incapacitating. Thought processing is no longer possible.  Difficult to remember your own name. Control of movement and coordination are lost.   The worst pain imaginable 10 At this level, most patients pass out from pain. When this level is reached, collapse of the autonomic nervous system occurs, leading to a sudden drop in blood pressure and heart rate. This in turn results in a temporary and dramatic drop in blood flow to the brain, leading to a loss of consciousness. Fainting is one of the bodys self defense mechanisms. Passing out puts the brain in a calmed state and causes it to shut down for a while, in order to begin the healing process.    Summary: 1.   Refer to this scale when providing [place] with your pain level. 2.   Be accurate and careful when reporting your pain level. This will help with your care. 3.   Over-reporting your pain level will lead to loss of credibility. 4.   Even a level of 1/10 means that there is pain and will be treated at our facility. 5.   High, inaccurate reporting will be  documented as Symptom Exaggeration, leading to loss of credibility and suspicions of possible secondary gains such as obtaining more narcotics, or wanting to appear disabled, for fraudulent reasons. 6.   Only pain levels of 5 or below will be seen at our facility. 7.   Pain levels of 6 and above will be sent to the Emergency Department and the appointment cancelled.  ______________________________________________________________________________________________

## 2021-08-26 NOTE — Progress Notes (Signed)
Safety precautions to be maintained throughout the outpatient stay will include: orient to surroundings, keep bed in low position, maintain call bell within reach at all times, provide assistance with transfer out of bed and ambulation.  

## 2021-08-27 ENCOUNTER — Telehealth: Payer: Self-pay

## 2021-08-27 NOTE — Telephone Encounter (Signed)
error 

## 2021-08-27 NOTE — Telephone Encounter (Signed)
Post procedure phone call.  Patinet states he is doing well.

## 2021-09-01 ENCOUNTER — Other Ambulatory Visit: Payer: Self-pay | Admitting: Internal Medicine

## 2021-09-02 DIAGNOSIS — D631 Anemia in chronic kidney disease: Secondary | ICD-10-CM | POA: Diagnosis not present

## 2021-09-02 DIAGNOSIS — N2581 Secondary hyperparathyroidism of renal origin: Secondary | ICD-10-CM | POA: Diagnosis not present

## 2021-09-02 DIAGNOSIS — I129 Hypertensive chronic kidney disease with stage 1 through stage 4 chronic kidney disease, or unspecified chronic kidney disease: Secondary | ICD-10-CM | POA: Diagnosis not present

## 2021-09-02 DIAGNOSIS — N184 Chronic kidney disease, stage 4 (severe): Secondary | ICD-10-CM | POA: Diagnosis not present

## 2021-09-05 ENCOUNTER — Telehealth: Payer: Self-pay | Admitting: Pain Medicine

## 2021-09-05 ENCOUNTER — Encounter: Payer: Self-pay | Admitting: Pain Medicine

## 2021-09-05 NOTE — Telephone Encounter (Signed)
Had bilateral lumbar facet block on 08-26-21. Pain has been better in lower back, but has worsening pain in mid back. Patient advised to discuss with Dr. Dossie Arbour at [facility] on 09-09-21.

## 2021-09-05 NOTE — Telephone Encounter (Signed)
Patient lvmail stating he is in a lot of severe pain. Please call and advise.

## 2021-09-05 NOTE — Progress Notes (Signed)
Safety precautions to be maintained throughout the outpatient stay will include: orient to surroundings, keep bed in low position, maintain call bell within reach at all times, provide assistance with transfer out of bed and ambulation.  

## 2021-09-09 ENCOUNTER — Ambulatory Visit: Payer: PPO | Attending: Pain Medicine | Admitting: Pain Medicine

## 2021-09-09 ENCOUNTER — Other Ambulatory Visit: Payer: Self-pay

## 2021-09-09 DIAGNOSIS — M545 Low back pain, unspecified: Secondary | ICD-10-CM | POA: Diagnosis not present

## 2021-09-09 DIAGNOSIS — M5137 Other intervertebral disc degeneration, lumbosacral region: Secondary | ICD-10-CM | POA: Diagnosis not present

## 2021-09-09 DIAGNOSIS — M47816 Spondylosis without myelopathy or radiculopathy, lumbar region: Secondary | ICD-10-CM | POA: Diagnosis not present

## 2021-09-09 DIAGNOSIS — M4310 Spondylolisthesis, site unspecified: Secondary | ICD-10-CM | POA: Diagnosis not present

## 2021-09-09 DIAGNOSIS — G8929 Other chronic pain: Secondary | ICD-10-CM

## 2021-09-09 DIAGNOSIS — M47814 Spondylosis without myelopathy or radiculopathy, thoracic region: Secondary | ICD-10-CM | POA: Diagnosis not present

## 2021-09-09 DIAGNOSIS — M47894 Other spondylosis, thoracic region: Secondary | ICD-10-CM

## 2021-09-09 DIAGNOSIS — M546 Pain in thoracic spine: Secondary | ICD-10-CM | POA: Diagnosis not present

## 2021-09-09 DIAGNOSIS — M5134 Other intervertebral disc degeneration, thoracic region: Secondary | ICD-10-CM | POA: Diagnosis not present
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
## 2021-09-16 ENCOUNTER — Encounter: Payer: Self-pay | Admitting: Pain Medicine

## 2021-09-16 ENCOUNTER — Ambulatory Visit (HOSPITAL_BASED_OUTPATIENT_CLINIC_OR_DEPARTMENT_OTHER): Payer: PPO | Admitting: Pain Medicine

## 2021-09-16 ENCOUNTER — Other Ambulatory Visit: Payer: Self-pay

## 2021-09-16 ENCOUNTER — Ambulatory Visit
Admission: RE | Admit: 2021-09-16 | Discharge: 2021-09-16 | Disposition: A | Discharge status: Home / Self Care | Payer: PPO | Source: Ambulatory Visit | Attending: Pain Medicine | Admitting: Pain Medicine

## 2021-09-16 ENCOUNTER — Other Ambulatory Visit: Payer: Self-pay | Admitting: Family Medicine

## 2021-09-16 VITALS — BP 139/82 | HR 58 | Temp 97.2°F | Resp 18 | Ht 70.0 in | Wt 165.0 lb

## 2021-09-16 DIAGNOSIS — G8929 Other chronic pain: Secondary | ICD-10-CM

## 2021-09-16 DIAGNOSIS — M5134 Other intervertebral disc degeneration, thoracic region: Secondary | ICD-10-CM

## 2021-09-16 DIAGNOSIS — M546 Pain in thoracic spine: Secondary | ICD-10-CM

## 2021-09-16 DIAGNOSIS — M5489 Other dorsalgia: Secondary | ICD-10-CM

## 2021-09-16 DIAGNOSIS — M47894 Other spondylosis, thoracic region: Secondary | ICD-10-CM

## 2021-09-16 DIAGNOSIS — M47814 Spondylosis without myelopathy or radiculopathy, thoracic region: Secondary | ICD-10-CM

## 2021-09-16 DIAGNOSIS — M549 Dorsalgia, unspecified: Secondary | ICD-10-CM | POA: Insufficient documentation

## 2021-09-16 MED ORDER — LIDOCAINE HCL 2 % IJ SOLN
20.0000 mL | Freq: Once | INTRAMUSCULAR | Status: AC
  Administered 2021-09-16: 400 mg

## 2021-09-16 MED ORDER — PENTAFLUOROPROP-TETRAFLUOROETH EX AERO
INHALATION_SPRAY | Freq: Once | CUTANEOUS | Status: AC
  Administered 2021-09-16: 30 via TOPICAL
  Filled 2021-09-16: qty 116

## 2021-09-16 MED ORDER — TRIAMCINOLONE ACETONIDE 40 MG/ML IJ SUSP
40.0000 mg | Freq: Once | INTRAMUSCULAR | Status: AC
  Administered 2021-09-16: 40 mg
  Filled 2021-09-16: qty 1

## 2021-09-16 MED ORDER — DEXAMETHASONE SODIUM PHOSPHATE 10 MG/ML IJ SOLN
INTRAMUSCULAR | Status: AC
  Filled 2021-09-16: qty 1

## 2021-09-16 MED ORDER — ROPIVACAINE HCL 2 MG/ML IJ SOLN
18.0000 mL | Freq: Once | INTRAMUSCULAR | Status: AC
  Administered 2021-09-16: 18 mL via PERINEURAL

## 2021-09-16 MED ORDER — DEXAMETHASONE SODIUM PHOSPHATE 10 MG/ML IJ SOLN
20.0000 mg | Freq: Once | INTRAMUSCULAR | Status: AC
  Administered 2021-09-16: 20 mg

## 2021-09-16 MED ORDER — ROPIVACAINE HCL 2 MG/ML IJ SOLN
INTRAMUSCULAR | Status: AC
  Filled 2021-09-16: qty 40

## 2021-09-16 MED ORDER — ROPIVACAINE HCL 2 MG/ML IJ SOLN
4.0000 mL | Freq: Once | INTRAMUSCULAR | Status: AC
  Administered 2021-09-16: 4 mL

## 2021-09-16 NOTE — Telephone Encounter (Signed)
Medication Refill - Medication: HYDROcodone-acetaminophen (NORCO) 10-325 MG tablet   Has the patient contacted their pharmacy? No. Pt stated Dr. Caryn Section has to call this in   (Agent: If no, request that the patient contact the pharmacy for the refill. If patient does not wish to contact the pharmacy document the reason why and proceed with request.) (Agent: If yes, when and what did the pharmacy advise?)  Preferred Pharmacy (with phone number or street name): [id number] [address], [place], [place] [address]  Phone:  [phone]  Fax:  [phone]  Has the patient been seen for an appointment in the last year OR does the patient have an upcoming appointment? Yes.    Agent: Please be advised that RX refills may take up to 3 business days. We ask that you follow-up with your pharmacy.

## 2021-09-16 NOTE — Progress Notes (Signed)
PROVIDER NOTE: Interpretation of information contained herein should be left to medically-trained personnel. Specific patient instructions are provided elsewhere under "Patient Instructions" section of medical record. This document was created in part using STT-dictation technology, any transcriptional errors that may result from this process are unintentional.  Patient: Richard Cook Type: Established DOB: 1956/03/31 MRN: [id number] PCP: Birdie Sons, MD  Service: Procedure DOS: 09/16/2021 Setting: Ambulatory Location: Ambulatory outpatient facility Delivery: Face-to-face Provider: Gaspar Cola, MD Specialty: Interventional Pain Management Specialty designation: 09 Location: Outpatient facility Ref. Prov.: Milinda Pointer, MD    Primary Reason for Visit: Interventional Pain Management Treatment. CC: Back Pain (mid)    Procedure #1:  Anesthesia, Analgesia, Anxiolysis:  Type: Therapeutic Medial Branch Facet Block  #1  Region:Thoracic Level: T8 & T9 Medial Branch Level(s) Laterality: Bilateral  Anesthesia: Local (1-2% Lidocaine)  Anxiolysis: None  Sedation: None  Guidance: Fluoroscopy           Position: Prone   1. Chronic thoracic back pain (Bilateral)   2. DDD (degenerative disc disease), thoracic   3. Spondylosis without myelopathy or radiculopathy, thoracic region   4. Thoracic facet syndrome    Procedure #2:    Type: Quadratus lumborum muscle Trigger Point Injection (1-2 muscle groups) #1  CPT: 20552 Primary Purpose: Therapeutic Region: Paramedial Lumbosacral Level: PSIS Target Area: Quadratus lumborum muscle Trigger Point Approach: Percutaneous, ipsilateral approach. Laterality: Left Paramedial     1. Trigger point with back pain (PSIS area) (Left)    NAS-11 Pain score:   Pre-procedure: 2 /10   Post-procedure: 0-No pain/10     Pre-op H&P Assessment:  Richard Cook is a 66 y.o. (year old), male patient, seen today for interventional treatment.  He  has a past surgical history that includes Cholecystectomy open (1980's); Skin graft (Left, 1986); Mandible fracture surgery (1986); Vascular surgery (Left, 1986); Tibia fracture surgery (Right, 1986); left heart catheterization with coronary angiogram (N/A, 08/10/2011); percutaneous coronary stent intervention (pci-s) (08/10/2011); left heart catheterization with coronary/graft angiogram (N/A, 10/17/2013); implantable cardioverter defibrillator implant (N/A, 10/18/2013); Split night study (12/19/2015); Fracture surgery; Inguinal hernia repair (Bilateral, ~ 08/2016); Coronary angioplasty with stent (2013); Cardiac catheterization (2014); Ventricular ablation surgery (10/07/2016); Coronary artery bypass graft (2014); V TACH ABLATION (N/A, 10/07/2016); LEFT HEART CATH AND CORS/GRAFTS ANGIOGRAPHY (N/A, 11/17/2017); V TACH ABLATION (N/A, 12/08/2017); IR Radiologist Eval & Mgmt (05/22/2021); IR US Guide Vasc Access Right (05/29/2021); IR Angiogram Visceral Selective (05/29/2021); and IR INTRAVASCULAR ULTRASOUND NON CORONARY (05/29/2021). Richard Cook has a current medication list which includes the following prescription(s): aspirin ec, bisoprolol, cholecalciferol, clonazepam, furosemide, hydrocodone-acetaminophen, ibuprofen, levothyroxine, mexiletine, and fish oil, and the following Facility-Administered Medications: betamethasone acetate-betamethasone sodium phosphate. His primarily concern today is the Back Pain (mid)  Initial Vital Signs:  Pulse/HCG Rate: 60  Temp: (!) 97.2 F (36.2 C) Resp: 12 BP: 117/76 SpO2: 100 %  BMI: Estimated body mass index is 23.68 kg/m as calculated from the following:   Height as of this encounter: 5\' 10"  (1.778 m).   Weight as of this encounter: 165 lb (74.8 kg).  Risk Assessment: Allergies: Reviewed. He is allergic to eggs or egg-derived products and colchicine.  Allergy Precautions: None required Coagulopathies: Reviewed. None identified.  Blood-thinner therapy: None at this  time Active Infection(s): Reviewed. None identified. Richard Cook is afebrile  Site Confirmation: Richard Cook was asked to confirm the procedure and laterality before marking the site Procedure checklist: Completed Consent: Before the procedure and under the influence of no sedative(s), amnesic(s), or anxiolytics, the  patient was informed of the treatment options, risks and possible complications. To fulfill our ethical and legal obligations, as recommended by the American Medical Association's Code of Ethics, I have informed the patient of my clinical impression; the nature and purpose of the treatment or procedure; the risks, benefits, and possible complications of the intervention; the alternatives, including doing nothing; the risk(s) and benefit(s) of the alternative treatment(s) or procedure(s); and the risk(s) and benefit(s) of doing nothing. The patient was provided information about the general risks and possible complications associated with the procedure. These may include, but are not limited to: failure to achieve desired goals, infection, bleeding, organ or nerve damage, allergic reactions, paralysis, and death. In addition, the patient was informed of those risks and complications associated to Spine-related procedures, such as failure to decrease pain; infection (i.e.: Meningitis, epidural or intraspinal abscess); bleeding (i.e.: epidural hematoma, subarachnoid hemorrhage, or any other type of intraspinal or peri-dural bleeding); organ or nerve damage (i.e.: Any type of peripheral nerve, nerve root, or spinal cord injury) with subsequent damage to sensory, motor, and/or autonomic systems, resulting in permanent pain, numbness, and/or weakness of one or several areas of the body; allergic reactions; (i.e.: anaphylactic reaction); and/or death. Furthermore, the patient was informed of those risks and complications associated with the medications. These include, but are not limited to: allergic  reactions (i.e.: anaphylactic or anaphylactoid reaction(s)); adrenal axis suppression; blood sugar elevation that in diabetics may result in ketoacidosis or comma; water retention that in patients with history of congestive heart failure may result in shortness of breath, pulmonary edema, and decompensation with resultant heart failure; weight gain; swelling or edema; medication-induced neural toxicity; particulate matter embolism and blood vessel occlusion with resultant organ, and/or nervous system infarction; and/or aseptic necrosis of one or more joints. Finally, the patient was informed that Medicine is not an exact science; therefore, there is also the possibility of unforeseen or unpredictable risks and/or possible complications that may result in a catastrophic outcome. The patient indicated having understood very clearly. We have given the patient no guarantees and we have made no promises. Enough time was given to the patient to ask questions, all of which were answered to the patient's satisfaction. Mr. Preast has indicated that he wanted to continue with the procedure. Attestation: I, the ordering provider, attest that I have discussed with the patient the benefits, risks, side-effects, alternatives, likelihood of achieving goals, and potential problems during recovery for the procedure that I have provided informed consent. Date   Time: 09/16/2021  9:54 AM  Pre-Procedure Preparation:  Monitoring: As per clinic protocol. Respiration, ETCO2, SpO2, BP, heart rate and rhythm monitor placed and checked for adequate function Safety Precautions: Patient was assessed for positional comfort and pressure points before starting the procedure. Time-out: I initiated and conducted the "Time-out" before starting the procedure, as per protocol. The patient was asked to participate by confirming the accuracy of the "Time Out" information. Verification of the correct person, site, and procedure were performed and  confirmed by me, the nursing staff, and the patient. "Time-out" conducted as per Joint Commission's Universal Protocol (UP.01.01.01). Time: 1113  Description of Procedure #1:  Target Area: For Thoracic Facet Medial Branch, the target is between the top of the transverse processes at the point where the transverse process joints the vertebra and the superior-lateral aspect of the transverse process.  (More lateral towards the superior aspect of the thoracic spine.) Approach: Paraspinal approach. Area Prepped: Entire Posterior Thoracic Region DuraPrep (Iodine Povacrylex [0.7%  available iodine] and Isopropyl Alcohol, 74% w/w) Safety Precautions: Aspiration looking for blood return was conducted prior to all injections. At no point did we inject any substances, as a needle was being advanced. No attempts were made at seeking any paresthesias. Safe injection practices and needle disposal techniques used. Medications properly checked for expiration dates. SDV (single dose vial) medications used. Description of the Procedure: Protocol guidelines were followed. The patient was placed in position over the fluoroscopy table. The target area was identified and the area prepped in the usual manner. Skin & deeper tissues infiltrated with local anesthetic. Appropriate amount of time allowed to pass for local anesthetics to take effect. The procedure needles were then advanced to the target area. Proper needle placement secured. Negative aspiration confirmed. Solution injected in intermittent fashion, asking for systemic symptoms every 0.5cc of injectate. The needles were then removed and the area cleansed, making sure to leave some of the prepping solution back to take advantage of its long term bactericidal properties.          Start Time: 1113 hrs.  Imaging Guidance (Spinal) for procedure #1:  Type of Imaging Technique: Fluoroscopy Guidance (Spinal) Indication(s): Assistance in needle guidance and placement  for procedures requiring needle placement in or near specific anatomical locations not easily accessible without such assistance. Exposure Time: Please see nurses notes. Contrast: None used. Fluoroscopic Guidance: I was personally present during the use of fluoroscopy. "Tunnel Vision Technique" used to obtain the best possible view of the target area. Parallax error corrected before commencing the procedure. "Direction-depth-direction" technique used to introduce the needle under continuous pulsed fluoroscopy. Once target was reached, antero-posterior, oblique, and lateral fluoroscopic projection used confirm needle placement in all planes. Images permanently stored in EMR. Interpretation: No contrast injected. I personally interpreted the imaging intraoperatively. Adequate needle placement confirmed in multiple planes. Permanent images saved into the patient's record.  Description of Procedure #2:  Area Prepped: Entire Posterior Lumbosacral Region DuraPrep (Iodine Povacrylex [0.7% available iodine] and Isopropyl Alcohol, 74% w/w) Safety Precautions: Aspiration looking for blood return was conducted prior to all injections. At no point did we inject any substances, as a needle was being advanced. No attempts were made at seeking any paresthesias. Safe injection practices and needle disposal techniques used. Medications properly checked for expiration dates. SDV (single dose vial) medications used. Description of the Procedure: Protocol guidelines were followed. The patient was placed in position over the fluoroscopy table. The target area was identified and the area prepped in the usual manner. Skin & deeper tissues infiltrated with local anesthetic. Appropriate amount of time allowed to pass for local anesthetics to take effect. The procedure needles were then advanced to the target area. Proper needle placement secured. Negative aspiration confirmed. Solution injected in intermittent fashion, asking for  systemic symptoms every 0.5cc of injectate. The needles were then removed and the area cleansed, making sure to leave some of the prepping solution back to take advantage of its long term bactericidal properties.  Vitals:   09/16/21 0952 09/16/21 1100 09/16/21 1110 09/16/21 1120  BP: 117/76 131/79 121/70 139/82  Pulse: 60 (!) 52 (!) 53 (!) 58  Resp:  12 13 18   Temp: (!) 97.2 F (36.2 C)     SpO2: 100% 100% 100% 100%  Weight: 165 lb (74.8 kg)     Height: 5\' 10"  (1.778 m)       End Time: 1120 hrs. Materials:  Needle(s) Type: Regular needle Gauge: 25G Length: 1.5-in Medication(s): Please see orders  for medications and dosing details.  Imaging Guidance for procedure #2:  Type of Imaging Technique: None used Indication(s): N/A Exposure Time: No patient exposure Contrast: None used. Fluoroscopic Guidance: N/A Ultrasound Guidance: N/A Interpretation: N/A  Antibiotic Prophylaxis:   Anti-infectives (From admission, onward)    None      Indication(s): None identified  Post-operative Assessment:  Post-procedure Vital Signs:  Pulse/HCG Rate: (!) 58 (NSR)  Temp: (!) 97.2 F (36.2 C) Resp: 18 BP: 139/82 SpO2: 100 %  EBL: None  Complications: No immediate post-treatment complications observed by team, or reported by patient.  Note: The patient tolerated the entire procedure well. A repeat set of vitals were taken after the procedure and the patient was kept under observation following institutional policy, for this type of procedure. Post-procedural neurological assessment was performed, showing return to baseline, prior to discharge. The patient was provided with post-procedure discharge instructions, including a section on how to identify potential problems. Should any problems arise concerning this procedure, the patient was given instructions to immediately contact us, at any time, without hesitation. In any case, we plan to contact the patient by telephone for a follow-up  status report regarding this interventional procedure.  Comments:  No additional relevant information.  Plan of Care  Orders:  Orders Placed This Encounter  Procedures   THORACIC FACET BLOCK    Scheduling Instructions:     Thoracic Medial Branch Block     Side: Bilateral     Sedation: No Sedation.     Timeframe: Today    Order Specific Question:   Where will this procedure be performed?    Answer:   [facility]   TRIGGER POINT INJECTION    Scheduling Instructions:     Area: Lower Back     Side: Left     Sedation: No Sedation.     Timeframe: Today    Order Specific Question:   Where will this procedure be performed?    Answer:   [facility]   DG PAIN CLINIC C-ARM 1-60 MIN NO REPORT    Intraoperative interpretation by procedural physician at [facility].    Standing Status:   Standing    Number of Occurrences:   1    Order Specific Question:   Reason for exam:    Answer:   Assistance in needle guidance and placement for procedures requiring needle placement in or near specific anatomical locations not easily accessible without such assistance.   Informed Consent Details: Physician/Practitioner Attestation; Transcribe to consent form and obtain patient signature    Nursing Order: Transcribe to consent form and obtain patient signature. Note: Always confirm laterality of pain with Mr. Rawlins, before procedure.    Order Specific Question:   Physician/Practitioner attestation of informed consent for procedure/surgical case    Answer:   I, the physician/practitioner, attest that I have discussed with the patient the benefits, risks, side effects, alternatives, likelihood of achieving goals and potential problems during recovery for the procedure that I have provided informed consent.    Order Specific Question:   Procedure    Answer:   Thoracic medial branch facet block    Order Specific Question:   Physician/Practitioner performing the procedure     Answer:   Alissandra Geoffroy A. Dossie Arbour, MD    Order Specific Question:   Indication/Reason    Answer:   Thoracic upper back pain associated with thoracic facet syndrome (A63.016) and thoracic spondylosis without myelopathy or radiculopathy (W10.932)   Provide equipment /  supplies at bedside    "Block Tray" (Disposable   single use) Needle type: SpinalSpinal Amount/quantity: 4 Size: Regular (3.5-inch) Gauge: 22G    Standing Status:   Standing    Number of Occurrences:   1    Order Specific Question:   Specify    Answer:   Block Tray   Informed Consent Details: Physician/Practitioner Attestation; Transcribe to consent form and obtain patient signature    Provider Attestation: I, [place]. Dossie Arbour, MD, (Pain Management Specialist), the physician/practitioner, attest that I have discussed with the patient the benefits, risks, side effects, alternatives, likelihood of achieving goals and potential problems during recovery for the procedure that I have provided informed consent.    Scheduling Instructions:     Note: Always confirm laterality of pain with Mr. Ishler, before procedure.     Transcribe to consent form and obtain patient signature.    Order Specific Question:   Physician/Practitioner attestation of informed consent for procedure/surgical case    Answer:   I, the physician/practitioner, attest that I have discussed with the patient the benefits, risks, side effects, alternatives, likelihood of achieving goals and potential problems during recovery for the procedure that I have provided informed consent.    Order Specific Question:   Procedure    Answer:   Myoneural Block (Trigger Point injection)    Order Specific Question:   Physician/Practitioner performing the procedure    Answer:   Damisha Wolff A. Dossie Arbour MD    Order Specific Question:   Indication/Reason    Answer:   Musculoskeletal pain/myofascial pain secondary to trigger point   Chronic Opioid Analgesic:  No opioid analgesics  prescribed by our practice.   Medications ordered for procedure: Meds ordered this encounter  Medications   lidocaine (XYLOCAINE) 2 % (with pres) injection 400 mg   pentafluoroprop-tetrafluoroeth (GEBAUERS) aerosol   ropivacaine (PF) 2 mg/mL (0.2%) (NAROPIN) injection 18 mL   dexamethasone (DECADRON) injection 20 mg   ropivacaine (PF) 2 mg/mL (0.2%) (NAROPIN) injection 4 mL   triamcinolone acetonide (KENALOG-40) injection 40 mg   Medications administered: We administered lidocaine, pentafluoroprop-tetrafluoroeth, ropivacaine (PF) 2 mg/mL (0.2%), dexamethasone, ropivacaine (PF) 2 mg/mL (0.2%), and triamcinolone acetonide.  See the medical record for exact dosing, route, and time of administration.  Follow-up plan:   Return in about 2 weeks (around 09/30/2021) for Proc-day (T,Th), (VV), (PPE).       Interventional Therapies  Risk   Complexity Considerations:   Estimated body mass index is 25.11 kg/m as calculated from the following:   Height as of 07/17/21: 5\' 10"  (1.778 m).   Weight as of 07/17/21: 175 lb (79.4 kg). WNL   Planned   Pending:   Diagnostic right vs bilateral thoracic facet MBB #1  Therapeutic right T11-12 TESI #2    Under consideration:   Palliative right lumbar facet MBB  Therapeutic right lumbar facet RFA #1  CT of the thoracic spine    Completed:   Diagnostic right lumbar facet MBB x2 (08/26/2021) (1st: 100/100/100/100 x 540 days) (2nd: 100/100/100/100) Therapeutic left L4-5 LESI x1 (01/30/2016) (100/100/50/100)  Therapeutic right L4-5 LESI x1 (06/15/2019) (n/a) Therapeutic right T11-12 TESI x1 (03/29/2018) (100/100/100 x5 weeks/90-100)  Diagnostic/therapeutic left PSIS TPI/MNB x1 (01/30/2020) (100/100/85-90/90-100 x < 1 mo) (not as effective)   Therapeutic   Palliative (PRN) options:   Therapeutic/palliative right lumbar facet MBB      Recent Visits Date Type Provider Dept  09/09/21 Office Visit Milinda Pointer, MD [facility]  08/26/21  Procedure visit Dossie Arbour,  Beatriz Chancellor, [facility]  08/20/21 Office Visit Milinda Pointer, MD [facility]  Showing recent visits within past 90 days and meeting all other requirements Today's Visits Date Type Provider Dept  09/16/21 Procedure visit Milinda Pointer, MD [facility]  Showing today's visits and meeting all other requirements Future Appointments Date Type Provider Dept  09/30/21 Appointment Milinda Pointer, MD [facility]  Showing future appointments within next 90 days and meeting all other requirements  Disposition: Discharge home  Discharge (Date   Time): 09/16/2021; 1128 hrs.   Primary Care Physician: Birdie Sons, MD Location: [facility] Note by: Gaspar Cola, MD Date: 09/16/2021; Time: 11:49 AM  Disclaimer:  Medicine is not an Chief Strategy Officer. The only guarantee in medicine is that nothing is guaranteed. It is important to note that the decision to proceed with this intervention was based on the information collected from the patient. The Data and conclusions were drawn from the patient's questionnaire, the interview, and the physical examination. Because the information was provided in large part by the patient, it cannot be guaranteed that it has not been purposely or unconsciously manipulated. Every effort has been made to obtain as much relevant data as possible for this evaluation. It is important to note that the conclusions that lead to this procedure are derived in large part from the available data. Always take into account that the treatment will also be dependent on availability of resources and existing treatment guidelines, considered by other Pain Management Practitioners as being common knowledge and practice, at the time of the intervention. For Medico-Legal purposes, it is also important to point out that variation in procedural techniques and pharmacological choices are the  acceptable norm. The indications, contraindications, technique, and results of the above procedure should only be interpreted and judged by a Board-Certified Interventional Pain Specialist with extensive familiarity and expertise in the same exact procedure and technique.

## 2021-09-16 NOTE — Progress Notes (Signed)
Safety precautions to be maintained throughout the outpatient stay will include: orient to surroundings, keep bed in low position, maintain call bell within reach at all times, provide assistance with transfer out of bed and ambulation.  

## 2021-09-16 NOTE — Patient Instructions (Signed)
____________________________________________________________________________________________  Virtual Visits   What is a "Virtual Visit"? It is a healthcare communication encounter (medical visit) that takes place on real time (NOT TEXT or E-MAIL) over the telephone or computer device (desktop, laptop, tablet, smart phone, etc.). It allows for more location flexibility between the patient and the healthcare provider.  Who decides when these types of visits will be used? The physician.  Who is eligible for these types of visits? Only those patients that can be reliably reached over the telephone.  What do you mean by reliably? We do not have time to call everyone multiple times, therefore those that tend to screen calls and then call back later are not suitable candidates for this system. We understand how people are reluctant to pickup on "unknown" calls, therefore, we suggest adding our telephone numbers to your list of "CONTACT(s)". This way, you should be able to readily identify our calls when you receive one. All of our numbers are available below.   Who is not eligible? This option is not available for medication management encounters, specially for controlled substances. Patients on pain medications that fall under the category of controlled substances have to come in for "Face-to-Face" encounters. This is required for mandatory monitoring of these substances. You may be asked to provide a sample for an unannounced urine drug screening test (UDS), and we will need to count your pain pills. Not bringing your pills to be counted may result in no refill. Obviously, neither one of these can be done over the phone.  When will this type of visits be used? You can request a virtual visit whenever you are physically unable to attend a regular appointment. The decision will be made by the physician (or healthcare provider) on a case by case basis.   At what time will I be called? This is an  excellent question. The providers will try to call you whenever they have time available. Do not expect to be called at any specific time. The secretaries will assign you a time for your virtual visit appointment, but this is done simply to keep a list of those patients that need to be called, but not for the purpose of keeping a time schedule. Be advised that the call may come in anytime during the day, between the hours of 8:00 AM and 8::00 PM, depending on provider availability. We do understand that the system is not perfect. If you are unable to be available that day on a moments notice, then request an "in-person" appointment rather than a "virtual visit".  Can I request my medication visits to be "Virtual"? Yes you may request it, but the decision is entirely up to the healthcare provider. Control substances require specific monitoring that requires Face-to-Face encounters. The number of encounters  and the extent of the monitoring is determined on a case by case basis.  Add a new contact to your smart phone and label it "PAIN CLINIC" Under this contact add the following numbers: Main: [phone] (Official Contact Number) Nurses: [phone] (These are outgoing only calling systems. Do not call this number.) Dr. Zeeshan Korte: [phone] or [phone] (Outgoing calls only. Do not call this number.)  ____________________________________________________________________________________________   ____________________________________________________________________________________________  Post-Procedure Discharge Instructions  Instructions: Apply ice:  Purpose: This will minimize any swelling and discomfort after procedure.  When: Day of procedure, as soon as you get home. How: Fill a plastic sandwich bag with crushed ice. Cover it with a small towel and apply to   injection site. How long: (15 min on, 15 min off) Apply for 15 minutes then remove x 15 minutes.  Repeat sequence on day  of procedure, until you go to bed. Apply heat:  Purpose: To treat any soreness and discomfort from the procedure. When: Starting the next day after the procedure. How: Apply heat to procedure site starting the day following the procedure. How long: May continue to repeat daily, until discomfort goes away. Food intake: Start with clear liquids (like water) and advance to regular food, as tolerated.  Physical activities: Keep activities to a minimum for the first 8 hours after the procedure. After that, then as tolerated. Driving: If you have received any sedation, be responsible and do not drive. You are not allowed to drive for 24 hours after having sedation. Blood thinner: (Applies only to those taking blood thinners) You may restart your blood thinner 6 hours after your procedure. Insulin: (Applies only to Diabetic patients taking insulin) As soon as you can eat, you may resume your normal dosing schedule. Infection prevention: Keep procedure site clean and dry. Shower daily and clean area with soap and water. Post-procedure Pain Diary: Extremely important that this be done correctly and accurately. Recorded information will be used to determine the next step in treatment. For the purpose of accuracy, follow these rules: Evaluate only the area treated. Do not report or include pain from an untreated area. For the purpose of this evaluation, ignore all other areas of pain, except for the treated area. After your procedure, avoid taking a long nap and attempting to complete the pain diary after you wake up. Instead, set your alarm clock to go off every hour, on the hour, for the initial 8 hours after the procedure. Document the duration of the numbing medicine, and the relief you are getting from it. Do not go to sleep and attempt to complete it later. It will not be accurate. If you received sedation, it is likely that you were given a medication that may cause amnesia. Because of this, completing  the diary at a later time may cause the information to be inaccurate. This information is needed to plan your care. Follow-up appointment: Keep your post-procedure follow-up evaluation appointment after the procedure (usually 2 weeks for most procedures, 6 weeks for radiofrequencies). DO NOT FORGET to bring you pain diary with you.   Expect: (What should I expect to see with my procedure?) From numbing medicine (AKA: Local Anesthetics): Numbness or decrease in pain. You may also experience some weakness, which if present, could last for the duration of the local anesthetic. Onset: Full effect within 15 minutes of injected. Duration: It will depend on the type of local anesthetic used. On the average, 1 to 8 hours.  From steroids (Applies only if steroids were used): Decrease in swelling or inflammation. Once inflammation is improved, relief of the pain will follow. Onset of benefits: Depends on the amount of swelling present. The more swelling, the longer it will take for the benefits to be seen. In some cases, up to 10 days. Duration: Steroids will stay in the system x 2 weeks. Duration of benefits will depend on multiple posibilities including persistent irritating factors. Side-effects: If present, they may typically last 2 weeks (the duration of the steroids). Frequent: Cramps (if they occur, drink Gatorade and take over-the-counter Magnesium 450-500 mg once to twice a day); water retention with temporary weight gain; increases in blood sugar; decreased immune system response; increased appetite. Occasional: Facial flushing (red,   warm cheeks); mood swings; menstrual changes. Uncommon: Long-term decrease or suppression of natural hormones; bone thinning. (These are more common with higher doses or more frequent use. This is why we prefer that our patients avoid having any injection therapies in other practices.)  Very Rare: Severe mood changes; psychosis; aseptic necrosis. From procedure: Some  discomfort is to be expected once the numbing medicine wears off. This should be minimal if ice and heat are applied as instructed.  Call if: (When should I call?) You experience numbness and weakness that gets worse with time, as opposed to wearing off. New onset bowel or bladder incontinence. (Applies only to procedures done in the spine)  Emergency Numbers: Durning business hours (Monday - Thursday, 8:00 AM - 4:00 PM) (Friday, 9:00 AM - 12:00 Noon): [phone] After hours: [phone] NOTE: If you are having a problem and are unable connect with, or to talk to a provider, then go to your nearest urgent care or emergency department. If the problem is serious and urgent, please call 911. ____________________________________________________________________________________________   

## 2021-09-17 ENCOUNTER — Telehealth: Payer: Self-pay | Admitting: *Deleted

## 2021-09-17 MED ORDER — HYDROCODONE-ACETAMINOPHEN 10-325 MG PO TABS: 1.0000 | ORAL_TABLET | Freq: Three times a day (TID) | ORAL | 0 refills | Status: DC | PRN

## 2021-09-17 NOTE — Telephone Encounter (Signed)
No problems post procedure. 

## 2021-09-17 NOTE — Telephone Encounter (Signed)
Requested medications are due for refill today.  yes  Requested medications are on the active medications list.  yes  Last refill. 08/15/2021 #60 0 refills  Future visit scheduled.   yes  Notes to clinic.  Medication not delegated.    Requested Prescriptions  Pending Prescriptions Disp Refills   HYDROcodone-acetaminophen (NORCO) 10-325 MG tablet 60 tablet 0    Sig: Take 1 tablet by mouth every 8 (eight) hours as needed.     Not Delegated - Analgesics:  Opioid Agonist Combinations Failed - 09/16/2021  5:39 PM      Failed - This refill cannot be delegated      Failed - Urine Drug Screen completed in last 360 days      Failed - Valid encounter within last 3 months    Recent Outpatient Visits           6 months ago Primary insomnia   [facility] Birdie Sons, MD   1 year ago No-show for appointment   [place], [place]   1 year ago Depression, major, single episode, moderate [facility])   [facility] Birdie Sons, MD   1 year ago Syncope, unspecified syncope type   [facility] Trinna Post, PA-C   1 year ago Insomnia, unspecified type   [facility] Birdie Sons, MD       Future Appointments             In 2 days Fisher, Kirstie Peri, MD [facility], [place]

## 2021-09-19 ENCOUNTER — Ambulatory Visit (INDEPENDENT_AMBULATORY_CARE_PROVIDER_SITE_OTHER): Payer: PPO | Admitting: Family Medicine

## 2021-09-19 ENCOUNTER — Other Ambulatory Visit: Payer: Self-pay

## 2021-09-19 ENCOUNTER — Encounter: Payer: Self-pay | Admitting: Family Medicine

## 2021-09-19 VITALS — BP 130/87 | HR 67 | Temp 98.3°F | Resp 14 | Wt 172.0 lb

## 2021-09-19 DIAGNOSIS — J300 Vasomotor rhinitis: Secondary | ICD-10-CM | POA: Diagnosis not present

## 2021-09-19 DIAGNOSIS — I5022 Chronic systolic (congestive) heart failure: Secondary | ICD-10-CM

## 2021-09-19 DIAGNOSIS — M5489 Other dorsalgia: Secondary | ICD-10-CM

## 2021-09-19 MED ORDER — IPRATROPIUM BROMIDE 0.03 % NA SOLN: 2.0000 | Freq: Two times a day (BID) | NASAL | 12 refills | Status: DC

## 2021-09-19 MED ORDER — HYDROCODONE-ACETAMINOPHEN 10-325 MG PO TABS: 1.0000 | ORAL_TABLET | Freq: Three times a day (TID) | ORAL | 0 refills | Status: DC | PRN

## 2021-09-19 MED ORDER — CYCLOBENZAPRINE HCL 5 MG PO TABS: 5.0000 mg | ORAL_TABLET | Freq: Three times a day (TID) | ORAL | 1 refills | Status: DC | PRN

## 2021-09-19 NOTE — Progress Notes (Signed)
I,Roshena L Chambers,acting as a scribe for Lelon Huh, MD.,have documented all relevant documentation on the behalf of Lelon Huh, MD,as directed by  Lelon Huh, MD while in the presence of Lelon Huh, MD.    Established patient visit   Patient: Richard Cook   DOB: 20-Jul-1956   66 y.o. Male  MRN: [id number] Visit Date: 09/19/2021  Today's healthcare provider: Lelon Huh, MD   Chief Complaint  Patient presents with   Sinus Problem   Subjective     He states that for the last year his nose starts running incessantly when he goes outside. It does not run indoors, is not triggered by eating. Occurs during warm and cold weather, year round. Has tried Claritin D which helps for a few hours. No sinus pain or pressure.   He is also due for refill of hydrocodone/apap for chronic back pain which he typically takes once most days, occasionally more dependent on activity   Medications: Outpatient Medications Prior to Visit  Medication Sig   aspirin EC 81 MG tablet Take 1 tablet (81 mg total) by mouth daily. Swallow whole.   bisoprolol (ZEBETA) 5 MG tablet Take 0.5 tablets (2.5 mg total) by mouth daily.   cholecalciferol (VITAMIN D3) 25 MCG (1000 UNIT) tablet Take 1,000 Units by mouth daily.   clonazePAM (KLONOPIN) 1 MG tablet TAKE ONE TABLET EVERY AFTERNOON AND 1 & 1/2 TABLET AT BEDTIME   furosemide (LASIX) 40 MG tablet Take 80 mg by mouth daily.   HYDROcodone-acetaminophen (NORCO) 10-325 MG tablet Take 1 tablet by mouth every 8 (eight) hours as needed.   ibuprofen (ADVIL) 200 MG tablet Take 400 mg by mouth every 6 (six) hours as needed for headache or moderate pain.   levothyroxine (SYNTHROID) 88 MCG tablet TAKE 1 TABLET BY MOUTH EVERY DAY   mexiletine (MEXITIL) 250 MG capsule Take 250 mg by mouth 3 (three) times daily.   Omega-3 Fatty Acids (FISH OIL) 1200 MG CAPS Take 1,200 mg by mouth daily.   Facility-Administered Medications Prior to Visit  Medication Dose  Route Frequency Provider   betamethasone acetate-betamethasone sodium phosphate (CELESTONE) injection 3 mg  3 mg Intra-articular Once Edrick Kins, DPM    Review of Systems  Constitutional:  Negative for appetite change, chills and fever.  HENT:  Positive for rhinorrhea.   Respiratory:  Negative for chest tightness, shortness of breath and wheezing.   Cardiovascular:  Negative for chest pain and palpitations.  Gastrointestinal:  Negative for abdominal pain, nausea and vomiting.      Objective    BP 130/87 (BP Location: Right Arm, Patient Position: Sitting, Cuff Size: Normal)    Pulse 67    Temp 98.3 F (36.8 C) (Oral)    Resp 14    Wt 172 lb (78 kg)    SpO2 100% Comment: room air   BMI 24.68 kg/m  {Show previous vital signs (optional):23777}  Physical Exam   Slightly swollen, pale, boggy nasal turbinates. No drainage. No sinus tenderness. No bleeding.    Assessment & Plan     1. Back pain without sciatica refill HYDROcodone-acetaminophen (NORCO) 10-325 MG tablet; Take 1 tablet by mouth every 8 (eight) hours as needed.  Dispense: 270 tablet; Refill: 0  Has occasional spasms in back when hiking, can try cyclobenzaprine (FLEXERIL) 5 MG tablet; Take 1 tablet (5 mg total) by mouth 3 (three) times daily as needed for muscle spasms.  Dispense: 30 tablet; Refill: 1  2. Vasomotor rhinitis try-  ipratropium (ATROVENT) 0.03 % nasal spray; Place 2 sprays into both nostrils every 12 (twelve) hours.  Dispense: 30 mL; Refill: 12  3. Chronic systolic CHF (congestive heart failure) (HCC) Advised to NOT take any oral decongestants including Claritin D.       The entirety of the information documented in the History of Present Illness, Review of Systems and Physical Exam were personally obtained by me. Portions of this information were initially documented by the CMA and reviewed by me for thoroughness and accuracy.     Lelon Huh, MD  [facility] [phone]  (phone) [phone] (fax)  [place]

## 2021-09-22 ENCOUNTER — Ambulatory Visit (INDEPENDENT_AMBULATORY_CARE_PROVIDER_SITE_OTHER): Payer: PPO

## 2021-09-22 ENCOUNTER — Telehealth: Payer: Self-pay

## 2021-09-22 DIAGNOSIS — I5022 Chronic systolic (congestive) heart failure: Secondary | ICD-10-CM | POA: Diagnosis not present

## 2021-09-22 DIAGNOSIS — Z95810 Presence of automatic (implantable) cardiac defibrillator: Secondary | ICD-10-CM | POA: Diagnosis not present

## 2021-09-22 DIAGNOSIS — M109 Gout, unspecified: Secondary | ICD-10-CM

## 2021-09-22 MED ORDER — PREDNISONE 10 MG PO TABS: ORAL_TABLET | ORAL | 2 refills | Status: AC

## 2021-09-22 NOTE — Telephone Encounter (Signed)
Please review message below and advise if prednisone was supposed to be prescribed?

## 2021-09-22 NOTE — Addendum Note (Signed)
Addended by: Birdie Sons on: 09/22/2021 12:28 PM   Modules accepted: Orders

## 2021-09-22 NOTE — Telephone Encounter (Signed)
Copied from [place] [id number]. Topic: General - Other >> Sep 22, 2021  9:17 AM Yvette Rack wrote: Reason for CRM: Pt stated the Rx for prednisone was not received by his pharmacy. Pt also stated the 90 day supply for HYDROcodone-acetaminophen (NORCO) 10-325 MG tablet could not be filled until the 30 day supply had been completed. Pt requests that the Rx for prednisone has refills because he will be going out of town.

## 2021-09-24 NOTE — Progress Notes (Signed)
EPIC Encounter for ICM Monitoring  Patient Name: Richard Cook is a 66 y.o. male Date: 09/24/2021 Primary Care Physican: Birdie Sons, MD Primary Cardiologist: [name] Electrophysiologist: [name] Nephrologist: [facility] 07/17/2021 Office Weight: 175 lbs    Transmission reviewed.    Optivol thoracic impedance normal.   Prescribed: Furosemide 40 mg take 2 tablets (80 mg total) by mouth daily.  Per 09/02/2021 Nephrologist note, pt taking Furosemide every 3-4 days    Labs: 09/02/2021 Creatinine 3.09, BUN 55, Potassium 4.3, Sodium 141  Care Everywhere 07/17/2021 Creatinine 3.79, BUN 47, Potassium 3.8, Sodium 141, GFR 17 05/28/2021 Creatinine 2.84, BUN 33, Potassium 3.1, Sodium 135, GFR 24 05/16/2021 Creatinine 3.33, BUN 35, Potassium 3.5, Sodium 138, GFR 20  04/26/2021 Creatinine 2.24, BUN 43, Potassium 4.2, Sodium 139, GFR 20  02/24/2021 Creatinine 3.44, BUN 36, Potassium 5.2, Sodium 144, GFR 19 11/11/2020 Creatinine 4.84, BUN 48, Potassium 3.8, Sodium 142, GFR 13  A complete set of results can be found in Results Review.   Recommendations:  No changes.   Follow-up plan: ICM clinic phone appointment on 10/27/2021.   91 day device clinic remote transmission 11/07/2021.     EP/Cardiology Office Visits:  11/18/2021 with Dr. [name].     Copy of ICM check sent to Dr. [name].   3 month ICM trend: 09/22/2021.    12-14 Month ICM trend:     Rosalene Billings, RN 09/24/2021 1:56 PM

## 2021-09-30 ENCOUNTER — Ambulatory Visit: Payer: PPO | Attending: Pain Medicine | Admitting: Pain Medicine

## 2021-09-30 ENCOUNTER — Other Ambulatory Visit: Payer: Self-pay

## 2021-09-30 DIAGNOSIS — G8929 Other chronic pain: Secondary | ICD-10-CM

## 2021-09-30 DIAGNOSIS — M47816 Spondylosis without myelopathy or radiculopathy, lumbar region: Secondary | ICD-10-CM

## 2021-09-30 DIAGNOSIS — M545 Low back pain, unspecified: Secondary | ICD-10-CM

## 2021-09-30 DIAGNOSIS — M546 Pain in thoracic spine: Secondary | ICD-10-CM | POA: Diagnosis not present

## 2021-09-30 DIAGNOSIS — M4310 Spondylolisthesis, site unspecified: Secondary | ICD-10-CM | POA: Diagnosis not present

## 2021-09-30 DIAGNOSIS — M47894 Other spondylosis, thoracic region: Secondary | ICD-10-CM | POA: Diagnosis not present
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
## 2021-10-08 ENCOUNTER — Other Ambulatory Visit: Payer: Self-pay | Admitting: Internal Medicine

## 2021-10-17 ENCOUNTER — Telehealth: Payer: Self-pay

## 2021-10-17 NOTE — Telephone Encounter (Signed)
Left message on voicemail for patient to return call to our office to schedule 6 mo flup in May with Dr Lorenso Courier for median arcuate ligament syndrome.  Will continue efforts.  ?

## 2021-10-20 NOTE — Telephone Encounter (Signed)
Left message on voicemail for patient to return call to our office to schedule 6 mo flup in May with Dr Lorenso Courier for median arcuate ligament syndrome.  Will continue efforts.  ?

## 2021-10-21 NOTE — Telephone Encounter (Signed)
Left message on voicemail for patient to return call to our office to schedule 6 mo flup in May with Dr Lorenso Courier for median arcuate ligament syndrome.  Will continue efforts ?

## 2021-10-22 ENCOUNTER — Telehealth: Payer: Self-pay

## 2021-10-22 ENCOUNTER — Other Ambulatory Visit: Payer: Self-pay | Admitting: Family Medicine

## 2021-10-22 DIAGNOSIS — F5101 Primary insomnia: Secondary | ICD-10-CM

## 2021-10-22 DIAGNOSIS — F411 Generalized anxiety disorder: Secondary | ICD-10-CM

## 2021-10-22 NOTE — Telephone Encounter (Signed)
Needs referral to ENT  for further evaluation of rhinorhea  ?

## 2021-10-22 NOTE — Telephone Encounter (Signed)
Patient was prescribed ipratropium (ATROVENT) 0.03 % nasal spray during last office visit for Vasomotor rhinitis. Patient reports the spray is not helping. Please advise.  ?

## 2021-10-22 NOTE — Telephone Encounter (Signed)
Copied from [place] [phone]. Topic: Quick Communication - Rx Refill/Question ?>> Oct 22, 2021 12:19 PM Yvette Rack wrote: ?Pt stated he will be going out [place] for a few months and requests 90 day supply  ? ?Medication: clonazePAM (KLONOPIN) 1 MG tablet ? ?Has the patient contacted their pharmacy? No. ?(Agent: If no, request that the patient contact the pharmacy for the refill. If patient does not wish to contact the pharmacy document the reason why and proceed with request.) ?(Agent: If yes, when and what did the pharmacy advise?) ? ?Preferred Pharmacy (with phone number or street name): [id number] [place] [place]  ?Phone: [phone] ?Fax: [phone] ? ?Has the patient been seen for an appointment in the last year OR does the patient have an upcoming appointment? Yes.   ? ?Agent: Please be advised that RX refills may take up to 3 business days. We ask that you follow-up with your pharmacy. ?

## 2021-10-22 NOTE — Telephone Encounter (Signed)
Copied from [place] [phone]. Topic: General - Other ?>> Oct 22, 2021 12:22 PM Yvette Rack wrote: ?Reason for CRM: Pt reports that the nasal spray is not working. ?

## 2021-10-24 MED ORDER — CLONAZEPAM 1 MG PO TABS: ORAL_TABLET | ORAL | 3 refills | Status: DC

## 2021-10-24 NOTE — Telephone Encounter (Signed)
Pt called saying he has been out of the medication for three days.  He is asking for a 90 day supply because he is traveling and will be out of the state. ? ?CB#  [phone] ?

## 2021-10-24 NOTE — Telephone Encounter (Signed)
Called patient and advise of referral. Per patient he leaves the first of April to another state and he is not coming back. Wants to know if something else can be send in if possible.  ?

## 2021-10-24 NOTE — Telephone Encounter (Signed)
Requested medication (s) are due for refill today: Yes ? ?Requested medication (s) are on the active medication list: Yes ? ?Last refill:  06/06/21 ? ?Future visit scheduled: No ? ?Notes to clinic:  Unable to refill per protocol, cannot delegate. Pt stated he will be going out [place] for a few months and requests 90 day supply  ? ? ? ? ? ?Requested Prescriptions  ?Pending Prescriptions Disp Refills  ? clonazePAM (KLONOPIN) 1 MG tablet 75 tablet 3  ?  Sig: TAKE ONE TABLET EVERY AFTERNOON AND 1 & 1/2 TABLET AT BEDTIME  ?  ? Not Delegated - Psychiatry: Anxiolytics/Hypnotics 2 Failed - 10/23/2021 11:30 AM  ?  ?  Failed - This refill cannot be delegated  ?  ?  Failed - Urine Drug Screen completed in last 360 days  ?  ?  Passed - Patient is not pregnant  ?  ?  Passed - Valid encounter within last 6 months  ?  Recent Outpatient Visits   ? ?      ? 1 month ago Back pain without sciatica  ? [facility] Caryn Section, Kirstie Peri, MD  ? 8 months ago Primary insomnia  ? [facility] Caryn Section, Kirstie Peri, MD  ? 1 year ago No-show for appointment  ? [place], FNP  ? 1 year ago Depression, major, single episode, moderate ([place])  ? [facility] Birdie Sons, MD  ? 1 year ago Syncope, unspecified syncope type  ? [facility] [place], [place], [place]  ? ?  ?  ? ?  ?  ?  ? ? ? ? ?

## 2021-10-27 ENCOUNTER — Ambulatory Visit (INDEPENDENT_AMBULATORY_CARE_PROVIDER_SITE_OTHER): Payer: PPO

## 2021-10-27 DIAGNOSIS — Z95810 Presence of automatic (implantable) cardiac defibrillator: Secondary | ICD-10-CM

## 2021-10-27 DIAGNOSIS — I5022 Chronic systolic (congestive) heart failure: Secondary | ICD-10-CM | POA: Diagnosis not present

## 2021-10-27 NOTE — Progress Notes (Signed)
EPIC Encounter for ICM Monitoring ? ?Patient Name: Richard Cook is a 66 y.o. male ?Date: 10/27/2021 ?Primary Care Physican: Birdie Sons, MD ?Primary Cardiologist: [name] ?Electrophysiologist: [name] ?Nephrologist: [facility] ?07/17/2021 Office Weight: 175 lbs  ?  ?Transmission reviewed.  ?  ?Optivol thoracic impedance normal. ?  ?Prescribed: Furosemide 40 mg take 2 tablets (80 mg total) by mouth daily.  Per 09/02/2021 Nephrologist note, pt taking Furosemide every 3-4 days  ?  ?Labs: ?09/02/2021 Creatinine 3.09, BUN 55, Potassium 4.3, Sodium 141  Care Everywhere ?07/17/2021 Creatinine 3.79, BUN 47, Potassium 3.8, Sodium 141, GFR 17 ?05/28/2021 Creatinine 2.84, BUN 33, Potassium 3.1, Sodium 135, GFR 24 ?05/16/2021 Creatinine 3.33, BUN 35, Potassium 3.5, Sodium 138, GFR 20  ?04/26/2021 Creatinine 2.24, BUN 43, Potassium 4.2, Sodium 139, GFR 20  ?02/24/2021 Creatinine 3.44, BUN 36, Potassium 5.2, Sodium 144, GFR 19 ?11/11/2020 Creatinine 4.84, BUN 48, Potassium 3.8, Sodium 142, GFR 13  ?A complete set of results can be found in Results Review. ?  ?Recommendations:  No changes. ?  ?Follow-up plan: ICM clinic phone appointment on 12/01/2021.   91 day device clinic remote transmission 11/07/2021.   ?  ?EP/Cardiology Office Visits:  11/18/2021 with Dr. [name].   ?  ?Copy of ICM check sent to Dr. [name].  ?  ?3 month ICM trend: 10/27/2021. ? ? ? ?12-14 Month ICM trend:  ? ? ? ?Rosalene Billings, RN ?10/27/2021 ?9:13 AM ? ?

## 2021-10-28 MED ORDER — AZELASTINE HCL 0.1 % NA SOLN
2.0000 | Freq: Two times a day (BID) | NASAL | 2 refills | Status: DC
Start: 2021-10-28 — End: 2022-05-06

## 2021-10-29 DIAGNOSIS — I10 Essential (primary) hypertension: Secondary | ICD-10-CM | POA: Diagnosis not present

## 2021-10-29 DIAGNOSIS — N184 Chronic kidney disease, stage 4 (severe): Secondary | ICD-10-CM | POA: Diagnosis not present

## 2021-10-29 DIAGNOSIS — N2581 Secondary hyperparathyroidism of renal origin: Secondary | ICD-10-CM | POA: Diagnosis not present

## 2021-10-31 ENCOUNTER — Encounter: Payer: PPO | Admitting: Student

## 2021-11-03 ENCOUNTER — Encounter: Payer: Self-pay | Admitting: Internal Medicine

## 2021-11-03 ENCOUNTER — Ambulatory Visit: Payer: PPO | Admitting: Internal Medicine

## 2021-11-03 VITALS — BP 128/82 | HR 62 | Ht 70.0 in | Wt 177.0 lb

## 2021-11-03 DIAGNOSIS — I472 Ventricular tachycardia, unspecified: Secondary | ICD-10-CM

## 2021-11-03 DIAGNOSIS — I255 Ischemic cardiomyopathy: Secondary | ICD-10-CM

## 2021-11-03 DIAGNOSIS — Z95810 Presence of automatic (implantable) cardiac defibrillator: Secondary | ICD-10-CM | POA: Diagnosis not present

## 2021-11-03 DIAGNOSIS — Z9229 Personal history of other drug therapy: Secondary | ICD-10-CM

## 2021-11-03 LAB — TSH: TSH: 1.18 u[IU]/mL (ref 0.450–4.500)

## 2021-11-03 MED ORDER — BISOPROLOL FUMARATE 5 MG PO TABS: 5.0000 mg | ORAL_TABLET | Freq: Every day | ORAL | 3 refills | Status: DC
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
## 2021-11-03 NOTE — Patient Instructions (Addendum)
Medication Instructions:  ?Your physician has recommended you make the following change in your medication:  ? ? INCREASE your bisoprolol -Take 5 mg by mouth once a day ? ?Labwork: ?You will get lab work today:  TSH ? ?Testing/Procedures: ?None ordered. ? ?Follow-Up: ?Your physician wants you to follow-up in: one year with Dr. [name] or one of the following Advanced Practice Providers on your designated Care Team:   ?Tommye Standard, PA-C ?Legrand Como "Jonni Sanger" [place], PA-C ? ?Remote monitoring is used to monitor your ICD from home. This monitoring reduces the number of office visits required to check your device to one time per year. It allows [place] to keep an eye on the functioning of your device to ensure it is working properly. You are scheduled for a device check from home on 11/07/2021. You may send your transmission at any time that day. If you have a wireless device, the transmission will be sent automatically. After your physician reviews your transmission, you will receive a postcard with your next transmission date. ? ?Any Other Special Instructions Will Be Listed Below (If Applicable). ? ?If you need a refill on your cardiac medications before your next appointment, please call your pharmacy.  ? ? ? ? ?

## 2021-11-04 ENCOUNTER — Other Ambulatory Visit: Payer: Self-pay

## 2021-11-04 MED ORDER — LEVOTHYROXINE SODIUM 88 MCG PO TABS: 88.0000 ug | ORAL_TABLET | Freq: Every day | ORAL | 1 refills | Status: DC

## 2021-11-04 NOTE — Progress Notes (Signed)
Per Dr [name], please refill the levothyroxine at current dose for 90 days. ?

## 2021-11-18 ENCOUNTER — Encounter: Payer: PPO | Admitting: Internal Medicine

## 2021-11-30 ENCOUNTER — Other Ambulatory Visit: Payer: Self-pay | Admitting: Internal Medicine

## 2021-12-02 ENCOUNTER — Telehealth: Payer: Self-pay

## 2021-12-02 NOTE — Telephone Encounter (Signed)
Attempted phone call to pt.  OK per Epic to leave detailed voicemail message.  Pt advised Dr [name] has reviewed labs and they are normal.  Pt may contact [phone] for any further questions or concerns. ?

## 2021-12-09 NOTE — Progress Notes (Signed)
No ICM remote transmission received for 12/01/2021 and next ICM transmission scheduled for 12/22/2021.   ?

## 2021-12-18 ENCOUNTER — Ambulatory Visit: Payer: Self-pay

## 2021-12-18 NOTE — Telephone Encounter (Signed)
  Chief Complaint: swelling left elbow Symptoms: redness to entire elbow, swelling that comes and goes, itching at site and chills- pt wonders it its gout to his elbow. Frequency: has had for a month Pertinent Negatives: Patient denies pain Disposition: [] ED /[] Urgent Care (no appt availability in office) / [x] Appointment(In office/virtual)/ []  [facility]/ [] Home Care/ [] Refused Recommended Disposition /[] [facility]/ []  Follow-up with PCP Additional Notes: appt with Dr Caryn Section tomorrow am.       Reason for Disposition  [1] SEVERE pain (e.g., excruciating, unable to use hand or wrist at all) AND [2] not improved after 2 hours of pain medicine  Answer Assessment - Initial Assessment Questions 1. LOCATION: "Where is the swelling?" (e.g., left, right, both elbows)     left 2. SIZE and DESCRIPTION: "What does the swelling look like?" (e.g., entire elbow, localized)     Red and swollen 3. ONSET: "When did the swelling start?" "Does it come and go, or is it there all the time?"     Over a month ago- always there but gets worse 4. SETTING: "Has there been any recent work, exercise or other activity that involved that part of the body?"      no 5. AGGRAVATING FACTORS: "What makes the elbow swelling worse?" (e.g., work, sports activities)     Comes on its own 6. ASSOCIATED SYMPTOMS: "Is there any pain or redness?"     Redness entire elbow 7. OTHER SYMPTOMS: "Do you have any other symptoms?" (e.g., fever)     Itches, chills today 8. PREGNANCY: "Is there any chance you are pregnant?" "When was your last menstrual period?"     N/a  Protocols used: Elbow Swelling-A-AH

## 2021-12-19 ENCOUNTER — Ambulatory Visit (INDEPENDENT_AMBULATORY_CARE_PROVIDER_SITE_OTHER): Payer: PPO | Admitting: Family Medicine

## 2021-12-19 ENCOUNTER — Encounter: Payer: Self-pay | Admitting: Family Medicine

## 2021-12-19 VITALS — BP 100/73 | HR 61 | Temp 98.6°F | Wt 173.0 lb

## 2021-12-19 DIAGNOSIS — M1A0790 Idiopathic chronic gout, unspecified ankle and foot, without tophus (tophi): Secondary | ICD-10-CM | POA: Diagnosis not present

## 2021-12-19 DIAGNOSIS — R609 Edema, unspecified: Secondary | ICD-10-CM | POA: Diagnosis not present

## 2021-12-19 DIAGNOSIS — E038 Other specified hypothyroidism: Secondary | ICD-10-CM

## 2021-12-19 MED ORDER — PREDNISONE 10 MG PO TABS: ORAL_TABLET | ORAL | 0 refills | Status: AC

## 2021-12-19 NOTE — Progress Notes (Signed)
I,Laura E Walsh,acting as a scribe for Lelon Huh, MD.,have documented all relevant documentation on the behalf of Lelon Huh, MD,as directed by  Lelon Huh, MD while in the presence of Lelon Huh, MD.  Established patient visit   Patient: Richard Cook   DOB: March 31, 1956   66 y.o. Male  MRN: [id number] Visit Date: 12/19/2021  Today's healthcare provider: Lelon Huh, MD   Chief Complaint  Patient presents with   Elbow Pain    Left elbow    Subjective    HPI HPI     Elbow Pain    Additional comments: Left elbow       Last edited by Kizzie Furnish, CMA on 12/19/2021  9:20 AM.      Has had know around left elbow for at least the last month.   He had been on 316m allopurinol for several years, but his new nephrologist cut him back to 1030mdue to CKD. His GFR in April was 23. Has not had uric acid level checked recently.   Having more swelling in legs lately. Took 4 x 4057murosemide yesterday. No more short of breath than usual.   Medications: Outpatient Medications Prior to Visit  Medication Sig   allopurinol (ZYLOPRIM) 100 MG tablet Take 100 mg by mouth daily.   aspirin EC 81 MG tablet Take 1 tablet (81 mg total) by mouth daily. Swallow whole.   azelastine (ASTELIN) 0.1 % nasal spray Place 2 sprays into both nostrils 2 (two) times daily. Use in each nostril as directed   bisoprolol (ZEBETA) 5 MG tablet Take 1 tablet (5 mg total) by mouth daily.   calcitRIOL (ROCALTROL) 0.25 MCG capsule Take 0.25 mcg by mouth daily.   cholecalciferol (VITAMIN D3) 25 MCG (1000 UNIT) tablet Take 1,000 Units by mouth daily.   clonazePAM (KLONOPIN) 1 MG tablet TAKE ONE TABLET EVERY AFTERNOON AND 1 & 1/2 TABLET AT BEDTIME   cyclobenzaprine (FLEXERIL) 5 MG tablet Take 1 tablet (5 mg total) by mouth 3 (three) times daily as needed for muscle spasms.   furosemide (LASIX) 40 MG tablet Take 80 mg by mouth daily.   HYDROcodone-acetaminophen (NORCO) 10-325 MG tablet Take  1 tablet by mouth every 8 (eight) hours as needed.   ibuprofen (ADVIL) 200 MG tablet Take 400 mg by mouth every 6 (six) hours as needed for headache or moderate pain.   levothyroxine (SYNTHROID) 88 MCG tablet Take 1 tablet (88 mcg total) by mouth daily before breakfast.   mexiletine (MEXITIL) 250 MG capsule TAKE 1 CAPSULE BY MOUTH 3 TIMES DAILY.   Omega-3 Fatty Acids (FISH OIL) 1200 MG CAPS Take 1,200 mg by mouth daily.   predniSONE (DELTASONE) 10 MG tablet Take 10 mg by mouth 2 (two) times daily with a meal.   Facility-Administered Medications Prior to Visit  Medication Dose Route Frequency Provider   betamethasone acetate-betamethasone sodium phosphate (CELESTONE) injection 3 mg  3 mg Intra-articular Once EvaEdrick KinsPM    Review of Systems  Constitutional:  Positive for fever (Comes and goes.). Negative for activity change, appetite change, chills, diaphoresis, fatigue and unexpected weight change.  Respiratory: Negative.    Cardiovascular:  Positive for leg swelling. Negative for chest pain and palpitations.  Gastrointestinal: Negative.   Musculoskeletal:  Positive for arthralgias (Left Elbow, Bilater Knee Pain) and joint swelling. Negative for back pain, gait problem, myalgias, neck pain and neck stiffness.      Objective    Wt 173 lb (  78.5 kg)   BMI 24.82 kg/m    Physical Exam   Left olecronon slightly tender and swollen,but not enough for aspiration.  2+ ankle edema bilaterally.   Assessment & Plan     1. Other specified hypothyroidism  - TSH  2. Chronic gout of ankle, unspecified cause, unspecified laterality Has had much more generalized joint pains and swellings since allopurinol was cut back from 300 a day to 100 a day by his nephrologist. Recently he started taking 2 x 100 a day but is now running out. Will adjust allopurinol dose based on Uric acid level which we are checking a day, but doses >200 are generally not recommend at his eGFR.   - predniSONE  (DELTASONE) 10 MG tablet; 6 tablets for 2 days, then 5 for 2 days, then 4 for 2 days, then 3 for 2 days, then 2 for 2 days, then 1 for 2 days.  Dispense: 42 tablet; Refill: 0  3. Edema, unspecified type Multifactorial. We are check thyroid functions today. May need early follow up with cardiology.       The entirety of the information documented in the History of Present Illness, Review of Systems and Physical Exam were personally obtained by me. Portions of this information were initially documented by the CMA and reviewed by me for thoroughness and accuracy.     Lelon Huh, MD  [facility] [phone] (phone) [phone] (fax)  [place]

## 2021-12-20 LAB — TSH: TSH: 4.95 u[IU]/mL — ABNORMAL HIGH (ref 0.450–4.500)

## 2021-12-20 LAB — URIC ACID: Uric Acid: 8.2 mg/dL (ref 3.8–8.4)

## 2021-12-21 ENCOUNTER — Other Ambulatory Visit: Payer: Self-pay | Admitting: Family Medicine

## 2021-12-21 DIAGNOSIS — E790 Hyperuricemia without signs of inflammatory arthritis and tophaceous disease: Secondary | ICD-10-CM

## 2021-12-21 MED ORDER — ALLOPURINOL 100 MG PO TABS: 200.0000 mg | ORAL_TABLET | Freq: Every day | ORAL | 2 refills | Status: DC

## 2021-12-25 NOTE — Progress Notes (Signed)
No ICM remote transmission received for 12/22/2021 and next ICM transmission scheduled for 12/30/2021.

## 2021-12-30 ENCOUNTER — Ambulatory Visit (INDEPENDENT_AMBULATORY_CARE_PROVIDER_SITE_OTHER): Payer: PPO

## 2021-12-30 DIAGNOSIS — Z95810 Presence of automatic (implantable) cardiac defibrillator: Secondary | ICD-10-CM | POA: Diagnosis not present

## 2021-12-30 DIAGNOSIS — I5022 Chronic systolic (congestive) heart failure: Secondary | ICD-10-CM | POA: Diagnosis not present

## 2022-01-01 ENCOUNTER — Telehealth: Payer: Self-pay

## 2022-01-01 NOTE — Telephone Encounter (Signed)
I spoke with the patient and he states he is traveling. He will hook up the monitor and send the transmission today.

## 2022-01-02 ENCOUNTER — Telehealth: Payer: Self-pay

## 2022-01-02 NOTE — Telephone Encounter (Signed)
Remote ICM transmission received.  Attempted call to patient regarding ICM remote transmission and left detailed message per DPR.  Advised to return call for any fluid symptoms or questions.  

## 2022-01-02 NOTE — Progress Notes (Signed)
EPIC Encounter for ICM Monitoring  Patient Name: Richard Cook is a 66 y.o. male Date: 01/02/2022 Primary Care Physican: Birdie Sons, MD Primary Cardiologist: [name] Electrophysiologist: [name] Nephrologist: [facility] 12/19/2021 Office Weight: 173 lbs    Attempted call to patient and unable to reach.  Left detailed message per DPR regarding transmission. Transmission reviewed.    Optivol thoracic impedance suggesting possible fluid accumulation staring 5/10 and returned to baseline normal 6/1.   Prescribed: Furosemide 40 mg take 2 tablets (80 mg total) by mouth daily.  Per 10/29/2021 Care Everywhere Nephrologist note, pt prescribed Furosemide 40 mg Take 1 tablet (40 mg total) by mouth in the morning and 1 tablet (40 mg total) in the evening   Labs: 10/29/2021 Creatinine 2.97, BUN 42, Potassium 3.8, Sodium 141, GFR 23 Care Everywhere 09/02/2021 Creatinine 3.09, BUN 55, Potassium 4.3, Sodium 141  Care Everywhere 07/17/2021 Creatinine 3.79, BUN 47, Potassium 3.8, Sodium 141, GFR 17 A complete set of results can be found in Results Review.   Recommendations:  Left voice mail with ICM number and encouraged to call if experiencing any fluid symptoms.   Follow-up plan: ICM clinic phone appointment on 02/09/2022.   91 day device clinic remote transmission 02/06/2022.     EP/Cardiology Office Visits:  Recall 10/31/2022 with Dr. [name].     Copy of ICM check sent to Dr. [name].   3 month ICM trend: 01/01/2022.    12-14 Month ICM trend:     Rosalene Billings, RN 01/02/2022 12:17 PM

## 2022-01-06 ENCOUNTER — Ambulatory Visit: Payer: PPO | Admitting: Family Medicine

## 2022-01-14 NOTE — Progress Notes (Deleted)
Established patient visit   Patient: Richard Cook   DOB: 08-08-1955   66 y.o. Male  MRN: [id number] Visit Date: 01/16/2022  Today's healthcare provider: Lelon Huh, MD   No chief complaint on file.  Subjective    HPI  Gout: Patient here for follow-up on uric acid levels. Changes made from last seen includes sent prescription for allopurinol to take 2 x 100mg  tablets every day. Schedule follow up in 6-8 weeks to recheck uric acid levels. Patient reports his chronic pain is {clinical course - assessment:31394}, his joint stiffness is {clinical course - assessment:31394} and his joint swelling is {clinical course - assessment:31394}. Limitation on activities include {Rheum limitation of activity:11904}.   Anxiety, Follow-up  He was last seen for anxiety 10 months ago. Changes made at last visit include none.   He reports {excellent/good/fair/poor:19665} compliance with treatment. He reports {good/fair/poor:18685} tolerance of treatment. He {is/is not:21021397} having side effects. {document side effects if present:1}  He feels his anxiety is {Desc; severity:60313} and {improved/worse/unchanged:3041574} since last visit.  Symptoms: {Yes/No:20286} chest pain {Yes/No:20286} difficulty concentrating  {Yes/No:20286} dizziness {Yes/No:20286} fatigue  {Yes/No:20286} feelings of losing control {Yes/No:20286} insomnia  {Yes/No:20286} irritable {Yes/No:20286} palpitations  {Yes/No:20286} panic attacks {Yes/No:20286} racing thoughts  {Yes/No:20286} shortness of breath {Yes/No:20286} sweating  {Yes/No:20286} tremors/shakes    GAD-7 Results    05/26/2016   11:41 AM  GAD-7 Generalized Anxiety Disorder Screening Tool  1. Feeling Nervous, Anxious, or on Edge 0  2. Not Being Able to Stop or Control Worrying 0  3. Worrying Too Much About Different Things 0  4. Trouble Relaxing 3  5. Being So Restless it's Hard To Sit Still 0  6. Becoming Easily Annoyed or Irritable 3  7.  Feeling Afraid As If Something Awful Might Happen 3  Total GAD-7 Score 9    PHQ-9 Scores    09/19/2021    3:39 PM 02/24/2021    3:19 PM 06/14/2020    2:54 PM  PHQ9 SCORE ONLY  PHQ-9 Total Score 3 7 15     ---------------------------------------------------------------------------------------------------  Follow up for insomnia  The patient was last seen for this 10 months ago. Changes made at last visit include Will reduce to clonazePAM (KLONOPIN) 1 MG tablet; 1/2 tablet every day at bedtime.  He reports {excellent/good/fair/poor:19665} compliance with treatment. He feels that condition is {improved/worse/unchanged:3041574}. He {is/is not:21021397} having side effects. ***  -----------------------------------------------------------------------------------------   Medications: Outpatient Medications Prior to Visit  Medication Sig   allopurinol (ZYLOPRIM) 100 MG tablet Take 2 tablets (200 mg total) by mouth daily.   aspirin EC 81 MG tablet Take 1 tablet (81 mg total) by mouth daily. Swallow whole.   azelastine (ASTELIN) 0.1 % nasal spray Place 2 sprays into both nostrils 2 (two) times daily. Use in each nostril as directed   bisoprolol (ZEBETA) 5 MG tablet Take 1 tablet (5 mg total) by mouth daily.   calcitRIOL (ROCALTROL) 0.25 MCG capsule Take 0.25 mcg by mouth daily.   cholecalciferol (VITAMIN D3) 25 MCG (1000 UNIT) tablet Take 1,000 Units by mouth daily.   clonazePAM (KLONOPIN) 1 MG tablet TAKE ONE TABLET EVERY AFTERNOON AND 1 & 1/2 TABLET AT BEDTIME   cyclobenzaprine (FLEXERIL) 5 MG tablet Take 1 tablet (5 mg total) by mouth 3 (three) times daily as needed for muscle spasms.   furosemide (LASIX) 40 MG tablet Take 80 mg by mouth daily.   HYDROcodone-acetaminophen (NORCO) 10-325 MG tablet Take 1 tablet by mouth every 8 (eight) hours as  needed.   ibuprofen (ADVIL) 200 MG tablet Take 400 mg by mouth every 6 (six) hours as needed for headache or moderate pain.   levothyroxine  (SYNTHROID) 88 MCG tablet Take 1 tablet (88 mcg total) by mouth daily before breakfast.   mexiletine (MEXITIL) 250 MG capsule TAKE 1 CAPSULE BY MOUTH 3 TIMES DAILY.   Omega-3 Fatty Acids (FISH OIL) 1200 MG CAPS Take 1,200 mg by mouth daily.   No facility-administered medications prior to visit.    Review of Systems  {Labs  Heme  Chem  Endocrine  Serology  Results Review (optional):23779}   Objective    There were no vitals taken for this visit. {Show previous vital signs (optional):23777}  Physical Exam  ***  No results found for any visits on 01/16/22.  Assessment & Plan     ***  No follow-ups on file.      {provider attestation***:1}   Lelon Huh, MD  [facility] [phone] (phone) [phone] (fax)  [place]

## 2022-01-15 ENCOUNTER — Ambulatory Visit (INDEPENDENT_AMBULATORY_CARE_PROVIDER_SITE_OTHER): Payer: Medicare PPO

## 2022-01-15 VITALS — Wt 173.0 lb

## 2022-01-15 DIAGNOSIS — Z0000 Encounter for general adult medical examination without abnormal findings: Secondary | ICD-10-CM | POA: Diagnosis not present
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
## 2022-01-15 NOTE — Patient Instructions (Signed)
Richard Cook , Thank you for taking time to come for your Medicare Wellness Visit. I appreciate your ongoing commitment to your health goals. Please review the following plan we discussed and let me know if I can assist you in the future.   Screening recommendations/referrals: Colonoscopy: referral declined  Recommended yearly ophthalmology/optometry visit for glaucoma screening and checkup Recommended yearly dental visit for hygiene and checkup  Vaccinations: Influenza vaccine: 06/06/21 Pneumococcal vaccine: n/d Tdap vaccine: 05/08/17 Shingles vaccine: n/d   Covid-19: 10/20/19, 11/20/19, 05/29/20, 06/06/21  Advanced directives: yes, copy needed  Conditions/risks identified: none  Next appointment: Follow up in one year for your annual wellness visit. 01/19/23 @ 2:15 pm by phone  Preventive Care 65 Years and Older, Male Preventive care refers to lifestyle choices and visits with your health care provider that can promote health and wellness. What does preventive care include? A yearly physical exam. This is also called an annual well check. Dental exams once or twice a year. Routine eye exams. Ask your health care provider how often you should have your eyes checked. Personal lifestyle choices, including: Daily care of your teeth and gums. Regular physical activity. Eating a healthy diet. Avoiding tobacco and drug use. Limiting alcohol use. Practicing safe sex. Taking low doses of aspirin every day. Taking vitamin and mineral supplements as recommended by your health care provider. What happens during an annual well check? The services and screenings done by your health care provider during your annual well check will depend on your age, overall health, lifestyle risk factors, and family history of disease. Counseling  Your health care provider may ask you questions about your: Alcohol use. Tobacco use. Drug use. Emotional well-being. Home and relationship well-being. Sexual  activity. Eating habits. History of falls. Memory and ability to understand (cognition). Work and work Statistician. Screening  You may have the following tests or measurements: Height, weight, and BMI. Blood pressure. Lipid and cholesterol levels. These may be checked every 5 years, or more frequently if you are over 23 years old. Skin check. Lung cancer screening. You may have this screening every year starting at age 32 if you have a 30-pack-year history of smoking and currently smoke or have quit within the past 15 years. Fecal occult blood test (FOBT) of the stool. You may have this test every year starting at age 92. Flexible sigmoidoscopy or colonoscopy. You may have a sigmoidoscopy every 5 years or a colonoscopy every 10 years starting at age 13. Prostate cancer screening. Recommendations will vary depending on your family history and other risks. Hepatitis C blood test. Hepatitis B blood test. Sexually transmitted disease (STD) testing. Diabetes screening. This is done by checking your blood sugar (glucose) after you have not eaten for a while (fasting). You may have this done every 1-3 years. Abdominal aortic aneurysm (AAA) screening. You may need this if you are a current or former smoker. Osteoporosis. You may be screened starting at age 82 if you are at high risk. Talk with your health care provider about your test results, treatment options, and if necessary, the need for more tests. Vaccines  Your health care provider may recommend certain vaccines, such as: Influenza vaccine. This is recommended every year. Tetanus, diphtheria, and acellular pertussis (Tdap, Td) vaccine. You may need a Td booster every 10 years. Zoster vaccine. You may need this after age 59. Pneumococcal 13-valent conjugate (PCV13) vaccine. One dose is recommended after age 29. Pneumococcal polysaccharide (PPSV23) vaccine. One dose is recommended after age 77.  Talk to your health care provider about which  screenings and vaccines you need and how often you need them. This information is not intended to replace advice given to you by your health care provider. Make sure you discuss any questions you have with your health care provider. Document Released: 08/16/2015 Document Revised: 04/08/2016 Document Reviewed: 05/21/2015 Elsevier Interactive Patient Education  2017 [place] Prevention in the Home Falls can cause injuries. They can happen to people of all ages. There are many things you can do to make your home safe and to help prevent falls. What can I do on the outside of my home? Regularly fix the edges of walkways and driveways and fix any cracks. Remove anything that might make you trip as you walk through a door, such as a raised step or threshold. Trim any bushes or trees on the path to your home. Use bright outdoor lighting. Clear any walking paths of anything that might make someone trip, such as rocks or tools. Regularly check to see if handrails are loose or broken. Make sure that both sides of any steps have handrails. Any raised decks and porches should have guardrails on the edges. Have any leaves, snow, or ice cleared regularly. Use sand or salt on walking paths during winter. Clean up any spills in your garage right away. This includes oil or grease spills. What can I do in the bathroom? Use night lights. Install grab bars by the toilet and in the tub and shower. Do not use towel bars as grab bars. Use non-skid mats or decals in the tub or shower. If you need to sit down in the shower, use a plastic, non-slip stool. Keep the floor dry. Clean up any water that spills on the floor as soon as it happens. Remove soap buildup in the tub or shower regularly. Attach bath mats securely with double-sided non-slip rug tape. Do not have throw rugs and other things on the floor that can make you trip. What can I do in the bedroom? Use night lights. Make sure that you have a  light by your bed that is easy to reach. Do not use any sheets or blankets that are too big for your bed. They should not hang down onto the floor. Have a firm chair that has side arms. You can use this for support while you get dressed. Do not have throw rugs and other things on the floor that can make you trip. What can I do in the kitchen? Clean up any spills right away. Avoid walking on wet floors. Keep items that you use a lot in easy-to-reach places. If you need to reach something above you, use a strong step stool that has a grab bar. Keep electrical cords out of the way. Do not use floor polish or wax that makes floors slippery. If you must use wax, use non-skid floor wax. Do not have throw rugs and other things on the floor that can make you trip. What can I do with my stairs? Do not leave any items on the stairs. Make sure that there are handrails on both sides of the stairs and use them. Fix handrails that are broken or loose. Make sure that handrails are as long as the stairways. Check any carpeting to make sure that it is firmly attached to the stairs. Fix any carpet that is loose or worn. Avoid having throw rugs at the top or bottom of the stairs. If you do have throw  rugs, attach them to the floor with carpet tape. Make sure that you have a light switch at the top of the stairs and the bottom of the stairs. If you do not have them, ask someone to add them for you. What else can I do to help prevent falls? Wear shoes that: Do not have high heels. Have rubber bottoms. Are comfortable and fit you well. Are closed at the toe. Do not wear sandals. If you use a stepladder: Make sure that it is fully opened. Do not climb a closed stepladder. Make sure that both sides of the stepladder are locked into place. Ask someone to hold it for you, if possible. Clearly mark and make sure that you can see: Any grab bars or handrails. First and last steps. Where the edge of each step  is. Use tools that help you move around (mobility aids) if they are needed. These include: Canes. Walkers. Scooters. Crutches. Turn on the lights when you go into a dark area. Replace any light bulbs as soon as they burn out. Set up your furniture so you have a clear path. Avoid moving your furniture around. If any of your floors are uneven, fix them. If there are any pets around you, be aware of where they are. Review your medicines with your doctor. Some medicines can make you feel dizzy. This can increase your chance of falling. Ask your doctor what other things that you can do to help prevent falls. This information is not intended to replace advice given to you by your health care provider. Make sure you discuss any questions you have with your health care provider. Document Released: 05/16/2009 Document Revised: 12/26/2015 Document Reviewed: 08/24/2014 Elsevier Interactive Patient Education  2017 [employer].

## 2022-01-16 ENCOUNTER — Ambulatory Visit: Payer: PPO | Admitting: Family Medicine

## 2022-01-19 ENCOUNTER — Ambulatory Visit (INDEPENDENT_AMBULATORY_CARE_PROVIDER_SITE_OTHER): Payer: Medicare PPO | Admitting: Physician Assistant

## 2022-01-19 ENCOUNTER — Encounter: Payer: Self-pay | Admitting: Physician Assistant

## 2022-01-19 VITALS — BP 110/79 | HR 75 | Temp 97.8°F | Resp 16 | Wt 176.3 lb

## 2022-01-19 DIAGNOSIS — M1A0790 Idiopathic chronic gout, unspecified ankle and foot, without tophus (tophi): Secondary | ICD-10-CM

## 2022-01-19 DIAGNOSIS — E038 Other specified hypothyroidism: Secondary | ICD-10-CM

## 2022-01-19 NOTE — Progress Notes (Unsigned)
I,Richard Cook,acting as a Education administrator for Richard Sachs, Richard Cook.,have documented all relevant documentation on the behalf of Richard Speak, Richard Cook,as directed by  Richard Sachs, Richard Cook while in the presence of Richard Sachs, Richard Cook.  Established patient visit   Patient: Richard Cook   DOB: 07/15/1956   66 y.o. Male  MRN: [id number] Visit Date: 01/19/2022  Today's healthcare provider: Mardene Speak, Richard Cook   Chief Complaint  Patient presents with  . Anxiety    Uric acid level Difficulty sleeping    Subjective    Follow up for Chronic gout of ankle, unspecified cause, unspecified laterality  The patient was last seen for this 1 months ago. Changes made at last visit include:  uric acid is 8.2  Allopurinol take 2 x 100mg  tablets every day. - predniSONE (DELTASONE) 10 MG tablet; 6 tablets for 2 days, then 5 for 2 days, then 4 for 2 days, then 3 for 2 days, then 2 for 2 days, then 1 for 2 days Reports taking Allopurinol 3 times daily. States he needs 3 to be affective.   He reports fair compliance with treatment. He feels that condition is Improved. He is not having side effects.   --------------------------------------------------------------------------------------- Patient complains of left elbow tendonitis.  When he stopped the Prednisone the pain comes back within a day.    Patient reports more anxiety and feels the Clonazepam may not be working anymore and wonders if he needs a higher dose. Difficulty sleeping with the anxiety     01/19/2022    1:15 PM 05/26/2016   11:41 AM  GAD 7 : Generalized Anxiety Score  Nervous, Anxious, on Edge 3 0  Control/stop worrying 3 0  Worry too much - different things 3 0  Trouble relaxing 3 3  Restless 3 0  Easily annoyed or irritable 0 3  Afraid - awful might happen 0 3  Total GAD 7 Score 15 9  Anxiety Difficulty Not difficult at all       01/19/2022    1:16 PM 01/15/2022    2:08 PM 09/19/2021    3:39 PM 02/24/2021    3:19 PM  06/14/2020    2:54 PM  Depression screen PHQ 2/9  Decreased Interest 1 0 0 1 3  Down, Depressed, Hopeless 1 0 0 1 3  PHQ - 2 Score 2 0 0 2 6  Altered sleeping 3 0 3 3 3   Tired, decreased energy 3 0 0 1 3  Change in appetite 0 0 0 1 3  Feeling bad or failure about yourself  0 0 0 0 0  Trouble concentrating 0 0 0 0 0  Moving slowly or fidgety/restless 0  0 0 0  Suicidal thoughts 0 0 0 0 0  PHQ-9 Score 8 0 3 7 15   Difficult doing work/chores Not difficult at all Not difficult at all Not difficult at all Not difficult at all Not difficult at all  -9   Medications: Outpatient Medications Prior to Visit  Medication Sig  . allopurinol (ZYLOPRIM) 100 MG tablet Take 2 tablets (200 mg total) by mouth daily.  Marland Kitchen aspirin EC 81 MG tablet Take 1 tablet (81 mg total) by mouth daily. Swallow whole.  Marland Kitchen azelastine (ASTELIN) 0.1 % nasal spray Place 2 sprays into both nostrils 2 (two) times daily. Use in each nostril as directed  . bisoprolol (ZEBETA) 5 MG tablet Take 1 tablet (5 mg total) by mouth daily.  . calcitRIOL (ROCALTROL) 0.25 MCG capsule Take  0.25 mcg by mouth daily.  . cholecalciferol (VITAMIN D3) 25 MCG (1000 UNIT) tablet Take 1,000 Units by mouth daily.  . clonazePAM (KLONOPIN) 1 MG tablet TAKE ONE TABLET EVERY AFTERNOON AND 1 & 1/2 TABLET AT BEDTIME  . cyclobenzaprine (FLEXERIL) 5 MG tablet Take 1 tablet (5 mg total) by mouth 3 (three) times daily as needed for muscle spasms.  . furosemide (LASIX) 40 MG tablet Take 80 mg by mouth daily.  Marland Kitchen HYDROcodone-acetaminophen (NORCO) 10-325 MG tablet Take 1 tablet by mouth every 8 (eight) hours as needed.  Marland Kitchen ibuprofen (ADVIL) 200 MG tablet Take 400 mg by mouth every 6 (six) hours as needed for headache or moderate pain.  Marland Kitchen levothyroxine (SYNTHROID) 88 MCG tablet Take 1 tablet (88 mcg total) by mouth daily before breakfast.  . mexiletine (MEXITIL) 250 MG capsule TAKE 1 CAPSULE BY MOUTH 3 TIMES DAILY.  Marland Kitchen Omega-3 Fatty Acids (FISH OIL) 1200 MG CAPS Take  1,200 mg by mouth daily.   No facility-administered medications prior to visit.    Review of Systems  Constitutional: Negative.   Respiratory: Negative.    Cardiovascular: Negative.   Gastrointestinal: Negative.   Except see HPI  {Labs  Heme  Chem  Endocrine  Serology  Results Review (optional):23779}   Objective    BP 110/79 (BP Location: Left Arm, Patient Position: Sitting, Cuff Size: Normal)   Pulse 75   Temp 97.8 F (36.6 C) (Oral)   Resp 16   Wt 176 lb 4.8 oz (80 kg)   SpO2 99%   BMI 25.30 kg/m  {Show previous vital signs (optional):23777}  Physical Exam Vitals reviewed.  Constitutional:      General: He is not in acute distress.    Appearance: Normal appearance. He is not diaphoretic.  HENT:     Head: Normocephalic and atraumatic.  Eyes:     General: No scleral icterus.    Conjunctiva/sclera: Conjunctivae normal.  Cardiovascular:     Rate and Rhythm: Normal rate and regular rhythm.     Pulses: Normal pulses.     Heart sounds: Normal heart sounds. No murmur heard. Pulmonary:     Effort: Pulmonary effort is normal. No respiratory distress.     Breath sounds: Normal breath sounds. No wheezing or rhonchi.  Musculoskeletal:     Cervical back: Neck supple.     Right lower leg: No edema.     Left lower leg: No edema.  Lymphadenopathy:     Cervical: No cervical adenopathy.  Skin:    General: Skin is warm and dry.     Findings: No rash.  Neurological:     Mental Status: He is alert and oriented to person, place, and time. Mental status is at baseline.  Psychiatric:        Mood and Affect: Mood normal.        Behavior: Behavior normal.    No results found for any visits on 01/19/22.  Assessment & Plan     1. Chronic gout of ankle, unspecified cause, unspecified laterality Continue allopurinol BID based on chronic gout treatment in pts with CKD Pt was informed but he insisted that he will continue to take allopurinol TID otherwise, pain did not  subside Last GFR was 17 with BUN 47, Creatinine 3.79 - Uric acid - Basic Metabolic Panel (BMET) Pt was Rx prednisone to use as needed base for a flare of gout  2. Other specified hypothyroidism Last TSH level was increased.  - TSH + free T4 Depending  on the results, might change the dose of levothyroxine.  3. Anxiety/insomnia - Continue current regimen Suggested to add trazodone or other medications for insomnia Or add SSRI for anxiety control however, patient prefers not to proceed with this treatment  If prednisone is responsible for his current anxiety symptoms , d/c prednisone Pt plans to travel to [place]  FU with pt after lab results The patient was advised to call back or seek an in-person evaluation if the symptoms worsen or if the condition fails to improve as anticipated.  I discussed the assessment and treatment plan with the patient. The patient was provided an opportunity to ask questions and all were answered. The patient agreed with the plan and demonstrated an understanding of the instructions.  The entirety of the information documented in the History of Present Illness, Review of Systems and Physical Exam were personally obtained by me. Portions of this information were initially documented by the CMA and reviewed by me for thoroughness and accuracy.  Portions of this note were created using dictation software and may contain typographical errors.      Total encounter time more than 30 minutes Greater than 50% was spent in counseling and coordination of care with the patient     Elberta Leatherwood  [facility] [phone] (phone) [phone] (fax)  [place]

## 2022-01-20 ENCOUNTER — Other Ambulatory Visit: Payer: Self-pay | Admitting: Family Medicine

## 2022-01-20 DIAGNOSIS — E790 Hyperuricemia without signs of inflammatory arthritis and tophaceous disease: Secondary | ICD-10-CM

## 2022-01-20 LAB — TSH+FREE T4
Free T4: 1.05 ng/dL (ref 0.82–1.77)
TSH: 1.92 u[IU]/mL (ref 0.450–4.500)

## 2022-01-20 LAB — BASIC METABOLIC PANEL
BUN/Creatinine Ratio: 15 (ref 10–24)
BUN: 53 mg/dL — ABNORMAL HIGH (ref 8–27)
CO2: 21 mmol/L (ref 20–29)
Calcium: 9.6 mg/dL (ref 8.6–10.2)
Chloride: 100 mmol/L (ref 96–106)
Creatinine, Ser: 3.57 mg/dL — ABNORMAL HIGH (ref 0.76–1.27)
Glucose: 96 mg/dL (ref 70–99)
Potassium: 4.1 mmol/L (ref 3.5–5.2)
Sodium: 141 mmol/L (ref 134–144)
eGFR: 18 mL/min/{1.73_m2} — ABNORMAL LOW (ref >59)

## 2022-01-20 LAB — URIC ACID: Uric Acid: 7.9 mg/dL (ref 3.8–8.4)

## 2022-01-21 ENCOUNTER — Other Ambulatory Visit: Payer: Self-pay | Admitting: Physician Assistant

## 2022-01-21 DIAGNOSIS — M10029 Idiopathic gout, unspecified elbow: Secondary | ICD-10-CM

## 2022-01-21 DIAGNOSIS — M1A0790 Idiopathic chronic gout, unspecified ankle and foot, without tophus (tophi): Secondary | ICD-10-CM

## 2022-01-21 MED ORDER — PREDNISONE 10 MG PO TABS: 10.0000 mg | ORAL_TABLET | Freq: Every day | ORAL | 0 refills | Status: DC

## 2022-01-21 NOTE — Progress Notes (Signed)
Hello Richard Cook ,   Your labwork results all are within normal limits including levels of Thyroid hormones. Your uric acid level improved to 7.9. Advised to continue with allopurinol till the "Therapeutic target for gout patients: <6.0" could be reached.  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 If you willing we could place a referral to a new nephrology? Per guidance, allopurinol should be taken 2x per day  Per your PCP's approval, I sent prednisone PRN /as needed to your pharmacy to use for a flare of gout  Per protocol, if you feel that you might need to change your current anxiety medication, you might need to see your PCP in person.  Any questions please reach out to the office or message me on MyChart!  Best, Mardene Speak, PA-C

## 2022-02-11 ENCOUNTER — Telehealth: Payer: Self-pay

## 2022-02-11 NOTE — Telephone Encounter (Signed)
I called the [phone] number and the person answer denied knowing the patient. I called the second number on file and it is disconnected.

## 2022-02-12 NOTE — Progress Notes (Signed)
No ICM remote transmission received for 02/09/2022 and next ICM transmission scheduled for 03/02/2022.   

## 2022-02-13 ENCOUNTER — Ambulatory Visit (INDEPENDENT_AMBULATORY_CARE_PROVIDER_SITE_OTHER): Payer: Medicare PPO

## 2022-02-13 DIAGNOSIS — I255 Ischemic cardiomyopathy: Secondary | ICD-10-CM | POA: Diagnosis not present

## 2022-02-13 LAB — CUP PACEART REMOTE DEVICE CHECK
Battery Remaining Longevity: 34 mo
Battery Voltage: 2.95 V
Brady Statistic RV Percent Paced: 0.03 %
Date Time Interrogation Session: 20230713205931
HighPow Impedance: 95 Ohm
Implantable Lead Implant Date: 20150318
Implantable Lead Location: 753860
Implantable Lead Model: 181
Implantable Lead Serial Number: 327195
Implantable Pulse Generator Implant Date: 20150318
Lead Channel Impedance Value: 665 Ohm
Lead Channel Impedance Value: 703 Ohm
Lead Channel Pacing Threshold Amplitude: 0.625 V
Lead Channel Pacing Threshold Pulse Width: 0.4 ms
Lead Channel Sensing Intrinsic Amplitude: 9.75 mV
Lead Channel Sensing Intrinsic Amplitude: 9.75 mV
Lead Channel Setting Pacing Amplitude: 2 V
Lead Channel Setting Pacing Pulse Width: 0.4 ms
Lead Channel Setting Sensing Sensitivity: 0.3 mV

## 2022-02-17 ENCOUNTER — Telehealth: Payer: Self-pay

## 2022-02-17 DIAGNOSIS — M5489 Other dorsalgia: Secondary | ICD-10-CM

## 2022-02-17 DIAGNOSIS — J300 Vasomotor rhinitis: Secondary | ICD-10-CM

## 2022-02-17 NOTE — Telephone Encounter (Signed)
Pt called, he states that he would like a refill of the following:  Requested Prescriptions   Pending Prescriptions Disp Refills   HYDROcodone-acetaminophen (NORCO) 10-325 MG tablet 270 tablet 0    Sig: Take 1 tablet by mouth every 8 (eight) hours as needed.   ipratropium (ATROVENT) 0.03 % nasal spray 30 mL 12    Sig: Place 2 sprays into both nostrils every 12 (twelve) hours.   Pt would like medication sent to: [facility], [place] [address]  Pt is traveling and would like medication sent there this time.

## 2022-02-18 ENCOUNTER — Other Ambulatory Visit: Payer: Self-pay | Admitting: Family Medicine

## 2022-02-18 DIAGNOSIS — F411 Generalized anxiety disorder: Secondary | ICD-10-CM

## 2022-02-18 DIAGNOSIS — F5101 Primary insomnia: Secondary | ICD-10-CM

## 2022-02-18 MED ORDER — IPRATROPIUM BROMIDE 0.03 % NA SOLN: 2.0000 | Freq: Two times a day (BID) | NASAL | 12 refills | Status: DC

## 2022-02-18 MED ORDER — HYDROCODONE-ACETAMINOPHEN 10-325 MG PO TABS: 1.0000 | ORAL_TABLET | Freq: Three times a day (TID) | ORAL | 0 refills | Status: DC | PRN

## 2022-02-18 NOTE — Telephone Encounter (Signed)
PT has called upset due to  HYDROcodone-acetaminophen (NORCO) 10-325 MG tablet 270 tablet 0 02/18/2022  Pt states only has 2 pills and he is in [place] and the pharmacy below states he needs a PA before allowed to pick up script. Pt is beside himself as states will be going into withdrawal in a day or so. He does not know where to begin on a PA and pharmacy was no help as told him to call his dr. Frances Furbish contact pt @ [phone] [id number] [place] [place] [address] [place]  [place] [address]  Phone: [phone] Fax: [phone]

## 2022-02-18 NOTE — Telephone Encounter (Signed)
I called Dr. Rosanna Randy and advised of the PA needed on the Hydrocodone and he said it's not something that will be taken care of by him today, he sent in the Rx to the pharmacy and that's all that can be done at this time. Patient would like a call back to discuss, see the notes below.

## 2022-02-18 NOTE — Telephone Encounter (Signed)
Requested medication (s) are due for refill today: Norco is, Atrovent nasal spray is not  Requested medication (s) are on the active medication list: Norco is, Atrovent nasal spray is different strength   Last refill:  Norco 09/19/21 #270 with 0 RF, Atrovent different strength  Future visit scheduled: no, seen 01/19/22  Notes to clinic:  Norco is not delegated and Atrovent does not have a protocol to follow, please assess.      Requested Prescriptions  Pending Prescriptions Disp Refills   HYDROcodone-acetaminophen (NORCO) 10-325 MG tablet 270 tablet 0    Sig: Take 1 tablet by mouth every 8 (eight) hours as needed.     Not Delegated - Analgesics:  Opioid Agonist Combinations Failed - 02/17/2022  4:02 PM      Failed - This refill cannot be delegated      Failed - Urine Drug Screen completed in last 360 days      Passed - Valid encounter within last 3 months    Recent Outpatient Visits           1 month ago Chronic gout of ankle, unspecified cause, unspecified laterality   [facility] [place], [place], PA-C   2 months ago Other specified hypothyroidism   [facility] Birdie Sons, MD   5 months ago Back pain without sciatica   [facility] Birdie Sons, MD   11 months ago Primary insomnia   [facility] Birdie Sons, MD   1 year ago No-show for appointment   [place], Kelby Aline, FNP               ipratropium (ATROVENT) 0.03 % nasal spray 30 mL 12    Sig: Place 2 sprays into both nostrils every 12 (twelve) hours.     Off-Protocol Failed - 02/17/2022  4:02 PM      Failed - Medication not assigned to a protocol, review manually.      Passed - Valid encounter within last 12 months    Recent Outpatient Visits           1 month ago Chronic gout of ankle, unspecified cause, unspecified laterality   [facility] [place], [place], PA-C   2 months ago Other specified  hypothyroidism   [facility] Birdie Sons, MD   5 months ago Back pain without sciatica   [facility] Birdie Sons, MD   11 months ago Primary insomnia   [facility] Birdie Sons, MD   1 year ago No-show for appointment   [place], FNP             Off-Protocol Failed - 02/17/2022  4:02 PM      Failed - Medication not assigned to a protocol, review manually.      Passed - Valid encounter within last 12 months    Recent Outpatient Visits           1 month ago Chronic gout of ankle, unspecified cause, unspecified laterality   [facility] [place], [place], PA-C   2 months ago Other specified hypothyroidism   [facility] Birdie Sons, MD   5 months ago Back pain without sciatica   [facility] Birdie Sons, MD   11 months ago Primary insomnia   [facility] Birdie Sons, MD   1 year ago No-show for appointment   [facility] [place], [place]  S, FNP

## 2022-02-18 NOTE — Telephone Encounter (Signed)
Medication Refill - Medication: clonazePAM (KLONOPIN) 1 MG tablet Pt requesting refills to not have to call in all the time.    Has the patient contacted their pharmacy? No. No, more refills.  (Agent: If no, request that the patient contact the pharmacy for the refill. If patient does not wish to contact the pharmacy document the reason why and proceed with request.)   Preferred Pharmacy (with phone number or street name):  [id number] [address] [place] [facility] [address]  [place] [address]  Phone: [phone] Fax: [phone]  Hours: Not open 24 hours   Has the patient been seen for an appointment in the last year OR does the patient have an upcoming appointment? Yes.    Agent: Please be advised that RX refills may take up to 3 business days. We ask that you follow-up with your pharmacy.

## 2022-02-19 ENCOUNTER — Telehealth: Payer: Self-pay | Admitting: Family Medicine

## 2022-02-19 ENCOUNTER — Ambulatory Visit: Payer: Self-pay

## 2022-02-19 MED ORDER — CLONAZEPAM 1 MG PO TABS: ORAL_TABLET | ORAL | 3 refills | Status: DC

## 2022-02-19 NOTE — Telephone Encounter (Signed)
Requested medication (s) are due for refill today: Yes  Requested medication (s) are on the active medication list: Yes  Last refill:  10/04/21  Future visit scheduled: No  Notes to clinic:  Unable to refill per protocol, cannot delegate.      Requested Prescriptions  Pending Prescriptions Disp Refills   clonazePAM (KLONOPIN) 1 MG tablet 75 tablet 3    Sig: TAKE ONE TABLET EVERY AFTERNOON AND 1 & 1/2 TABLET AT BEDTIME     Not Delegated - Psychiatry: Anxiolytics/Hypnotics 2 Failed - 02/18/2022 11:35 AM      Failed - This refill cannot be delegated      Failed - Urine Drug Screen completed in last 360 days      Passed - Patient is not pregnant      Passed - Valid encounter within last 6 months    Recent Outpatient Visits           1 month ago Chronic gout of ankle, unspecified cause, unspecified laterality   [employer], [place], PA-C   2 months ago Other specified hypothyroidism   [facility] Birdie Sons, MD   5 months ago Back pain without sciatica   [facility] Birdie Sons, MD   12 months ago Primary insomnia   [place], Kirstie Peri, MD   1 year ago No-show for appointment   [place], Kelby Aline, FNP

## 2022-02-19 NOTE — Telephone Encounter (Signed)
  Chief Complaint: Refills needed Symptoms: Pain Frequency: ongoing - needs meds Pertinent Negatives: Patient denies  Disposition: [] ED /[] Urgent Care (no appt availability in office) / [] Appointment(In office/virtual)/ []  [facility]/ [] Home Care/ [] Refused Recommended Disposition /[] [place] [facility]/ [x]  Follow-up with PCP Additional Notes: Pt states that pharmacy has not received refill for klonopin. And refill for hydrocodone was called , but was not filled as a preauthorization is needed. Pt has switched insurance and now has [employer]. PT is becoming frustrated with lack of response to medication needs.  Please return pt's call when refills are completed and called in. Answer Assessment - Initial Assessment Questions 1. DRUG NAME: "What medicine do you need to have refilled?"     Hydrocodone, and klonopin 2. REFILLS REMAINING: "How many refills are remaining?" (Note: The label on the medicine or pill bottle will show how many refills are remaining. If there are no refills remaining, then a renewal may be needed.)     0 3. EXPIRATION DATE: "What is the expiration date?" (Note: The label states when the prescription will expire, and thus can no longer be refilled.)      4. PRESCRIBING HCP: "Who prescribed it?" Reason: If prescribed by specialist, call should be referred to that group.     [facility] 5. SYMPTOMS: "Do you have any symptoms?"     pain 6. PREGNANCY: "Is there any chance that you are pregnant?" "When was your last menstrual period?"     na  Protocols used: Medication Refill and Renewal Call-A-AH

## 2022-02-19 NOTE — Telephone Encounter (Signed)
Patient called stating that he spoke with [facility] to see why he needed a PA. Patient says he was advised that our office needed to call to speak with them about the Prior authorization. I called [employer] and was advised that they received the PA request and it is still under review. They will send [place] a fax within 72 hours on their decision.

## 2022-02-19 NOTE — Telephone Encounter (Signed)
Prior authorization completed via covermymeds. Awaiting response from insurance. Tried calling patient. Left message to call back. OK for PEC to advise.

## 2022-02-19 NOTE — Telephone Encounter (Signed)
Message from covermymeds: Your information has been submitted to [facility]. [employer] will review the request and will issue a decision, typically within 3-7 days from your submission. You can check the updated outcome later by reopening this request.  If [employer] has not responded in 3-7 days or if you have any questions about your ePA request, please contact [employer] at [phone]

## 2022-02-19 NOTE — Telephone Encounter (Signed)
Pt was advised that the RX for HYDROcodone-acetaminophen ([place]) 10-325 MG tablet / pt will be out of medication and he is concerned about not having any medication / please advise and call pt to update

## 2022-02-20 NOTE — Telephone Encounter (Signed)
Pt is calling to check on the status of the PA. Pt states that he has a 1/2 a pill left. Pt states that he is does not want to go into withdrawal. Please advise CB- [phone]

## 2022-02-20 NOTE — Telephone Encounter (Signed)
Patient called in checking on status of PA(because he changed insurances) for HYDROcodone-acetaminophen (NORCO) 10-325 MG tablet . Patient is out of town and needs this medication. Please cal back with status.

## 2022-02-23 NOTE — Telephone Encounter (Signed)
I called pharmacy and was advised that patient picked up prescription. His insurance will only allow 30 day supply, so a qty of 90 tablets was dispensed to patient.

## 2022-02-26 NOTE — Progress Notes (Signed)
Remote ICD transmission.   

## 2022-03-04 NOTE — Progress Notes (Signed)
No ICM remote transmission received for 03/02/2022 and next ICM transmission scheduled for 03/30/2022.   

## 2022-03-09 DIAGNOSIS — R0609 Other forms of dyspnea: Secondary | ICD-10-CM | POA: Diagnosis not present

## 2022-03-09 DIAGNOSIS — I493 Ventricular premature depolarization: Secondary | ICD-10-CM | POA: Diagnosis not present

## 2022-03-09 DIAGNOSIS — I499 Cardiac arrhythmia, unspecified: Secondary | ICD-10-CM | POA: Diagnosis not present

## 2022-03-15 ENCOUNTER — Other Ambulatory Visit: Payer: Self-pay | Admitting: Family Medicine

## 2022-03-15 DIAGNOSIS — J300 Vasomotor rhinitis: Secondary | ICD-10-CM

## 2022-03-18 ENCOUNTER — Ambulatory Visit (INDEPENDENT_AMBULATORY_CARE_PROVIDER_SITE_OTHER): Payer: Medicare PPO

## 2022-03-18 DIAGNOSIS — Z95810 Presence of automatic (implantable) cardiac defibrillator: Secondary | ICD-10-CM | POA: Diagnosis not present

## 2022-03-18 DIAGNOSIS — I5022 Chronic systolic (congestive) heart failure: Secondary | ICD-10-CM | POA: Diagnosis not present

## 2022-03-18 NOTE — Progress Notes (Signed)
EPIC Encounter for ICM Monitoring  Patient Name: Richard Cook is a 66 y.o. male Date: 03/18/2022 Primary Care Physican: Birdie Sons, MD Primary Cardiologist: [name] Electrophysiologist: [name] Nephrologist: [facility] 12/19/2021 Office Weight: 173 lbs   Monitored Since 12-Feb-2022 VT-NS (>4 beats, >140 bpm)  18 SVT: VT/VF Rx Withheld 7   Transmission reviewed.    Optivol thoracic impedance suggesting normal fluid levels.   Prescribed: Furosemide 40 mg take 2 tablets (80 mg total) by mouth daily.     Labs: 01/19/2022 Creatinine 3.57, BUN 53, Potassium 4.1, Sodium 141, GFR 18 10/29/2021 Creatinine 2.97, BUN 42, Potassium 3.8, Sodium 141, GFR 23 Care Everywhere 09/02/2021 Creatinine 3.09, BUN 55, Potassium 4.3, Sodium 141  Care Everywhere 07/17/2021 Creatinine 3.79, BUN 47, Potassium 3.8, Sodium 141, GFR 17 A complete set of results can be found in Results Review.   Recommendations:  No changes.    Follow-up plan: ICM clinic phone appointment on 04/20/2022.   91 day device clinic remote transmission 05/15/2022.     EP/Cardiology Office Visits:  Recall 10/31/2022 with Dr. [name].     Copy of ICM check sent to Dr. [name].   3 month ICM trend: 03/15/2022.    12-14 Month ICM trend:     Rosalene Billings, RN 03/18/2022 4:24 PM

## 2022-04-07 ENCOUNTER — Telehealth: Payer: Self-pay

## 2022-04-07 ENCOUNTER — Other Ambulatory Visit: Payer: Self-pay | Admitting: Internal Medicine

## 2022-04-07 ENCOUNTER — Other Ambulatory Visit: Payer: Self-pay

## 2022-04-07 ENCOUNTER — Other Ambulatory Visit: Payer: Self-pay | Admitting: Family Medicine

## 2022-04-07 ENCOUNTER — Telehealth: Payer: Self-pay | Admitting: Internal Medicine

## 2022-04-07 DIAGNOSIS — N184 Chronic kidney disease, stage 4 (severe): Secondary | ICD-10-CM

## 2022-04-07 DIAGNOSIS — M10029 Idiopathic gout, unspecified elbow: Secondary | ICD-10-CM

## 2022-04-07 DIAGNOSIS — M5489 Other dorsalgia: Secondary | ICD-10-CM

## 2022-04-07 MED ORDER — MEXILETINE HCL 250 MG PO CAPS: 250.0000 mg | ORAL_CAPSULE | Freq: Three times a day (TID) | ORAL | 2 refills | Status: DC

## 2022-04-07 NOTE — Telephone Encounter (Signed)
Pt's medication was sent to pt's pharmacy as requested. Confirmation received.  °

## 2022-04-07 NOTE — Telephone Encounter (Signed)
*  STAT* If patient is at the pharmacy, call can be transferred to refill team.   1. Which medications need to be refilled? (please list name of each medication and dose if known)   mexiletine (MEXITIL) 250 MG capsule    2. Which pharmacy/location (including street and city if local pharmacy) is medication to be sent to? [id number] [place] [place]  3. Do they need a 30 day or 90 day supply?  90 day

## 2022-04-07 NOTE — Telephone Encounter (Signed)
Medication Refill - Medication: HYDROcodone-acetaminophen (NORCO) 10-325 MG tablet   predniSONE (DELTASONE) 10 MG tablet   Has the patient contacted their pharmacy? Yes.   (Agent: If no, request that the patient contact the pharmacy for the refill. If patient does not wish to contact the pharmacy document the reason why and proceed with request.) (Agent: If yes, when and what did the pharmacy advise?)  Preferred Pharmacy (with phone number or street name): [id number] [address], [address] [phone] Has the patient been seen for an appointment in the last year OR does the patient have an upcoming appointment? Yes.    Agent: Please be advised that RX refills may take up to 3 business days. We ask that you follow-up with your pharmacy.

## 2022-04-07 NOTE — Telephone Encounter (Signed)
Copied from [place] [id number]. Topic: General - Other >> Apr 07, 2022  8:41 AM Chapman Fitch wrote: Reason for CRM: Pt is having ids having blood work done on the 18th with his cardiologist and was going to have labs done for Dr. Caryn Section as well / pt asked what labs he needs to tell Dr/ [name] that he needs since he is doing blood work with that office on 9.18.23/ please advise

## 2022-04-08 NOTE — Telephone Encounter (Signed)
Requested medication (s) are due for refill today: Yes  Requested medication (s) are on the active medication list:Yes  Last refill:  Prednisone 01/21/22; Hydrocodone 02/18/22  Future visit scheduled: No  Notes to clinic:  Unable to refill per protocol, cannot delegate.      Requested Prescriptions  Pending Prescriptions Disp Refills   HYDROcodone-acetaminophen (NORCO) 10-325 MG tablet 270 tablet 0    Sig: Take 1 tablet by mouth every 8 (eight) hours as needed. Patient needs to schedule an appointment with Dr Caryn Section for fall of 2023     Not Delegated - Analgesics:  Opioid Agonist Combinations Failed - 04/07/2022 10:33 AM      Failed - This refill cannot be delegated      Failed - Urine Drug Screen completed in last 360 days      Passed - Valid encounter within last 3 months    Recent Outpatient Visits           2 months ago Chronic gout of ankle, unspecified cause, unspecified laterality   [facility] Aten, [place], PA-C   3 months ago Other specified hypothyroidism   [facility] Birdie Sons, MD   6 months ago Back pain without sciatica   [facility] Birdie Sons, MD   1 year ago Primary insomnia   [facility] Birdie Sons, MD   1 year ago No-show for appointment   [facility] Flinchum, Kelby Aline, FNP       Future Appointments             In 1 week [name], PA-C [place], [facility]             predniSONE (DELTASONE) 10 MG tablet 30 tablet 0    Sig: Take 1 tablet (10 mg total) by mouth daily with breakfast.     Not Delegated - Endocrinology:  Oral Corticosteroids Failed - 04/07/2022 10:33 AM      Failed - This refill cannot be delegated      Failed - Manual Review: Eye exam for IOP if prolonged treatment      Failed - Bone Mineral Density or Dexa Scan completed in the last 2 years      Passed -  Glucose (serum) in normal range and within 180 days    Glucose  Date Value Ref Range Status  01/19/2022 96 70 - 99 mg/dL Final  05/02/2012 83 mg/dL Final   Glucose, Bld  Date Value Ref Range Status  05/28/2021 100 (H) 70 - 99 mg/dL Final    Comment:    Glucose reference range applies only to samples taken after fasting for at least 8 hours.   Glucose-Capillary  Date Value Ref Range Status  08/24/2020 127 (H) 70 - 99 mg/dL Final    Comment:    Glucose reference range applies only to samples taken after fasting for at least 8 hours.         Passed - K in normal range and within 180 days    Potassium  Date Value Ref Range Status  01/19/2022 4.1 3.5 - 5.2 mmol/L Final         Passed - Na in normal range and within 180 days    Sodium  Date Value Ref Range Status  01/19/2022 141 134 - 144 mmol/L Final         Passed - Last BP in normal  range    BP Readings from Last 1 Encounters:  01/19/22 110/79         Passed - Valid encounter within last 6 months    Recent Outpatient Visits           2 months ago Chronic gout of ankle, unspecified cause, unspecified laterality   [facility] Kell, [place], PA-C   3 months ago Other specified hypothyroidism   [facility] Birdie Sons, MD   6 months ago Back pain without sciatica   [facility] Birdie Sons, MD   1 year ago Primary insomnia   [facility] Birdie Sons, MD   1 year ago No-show for appointment   [facility] Flinchum, Kelby Aline, FNP       Future Appointments             In 1 week Charlynn Grimes [place]. [facility], [facility]

## 2022-04-09 ENCOUNTER — Other Ambulatory Visit: Payer: Self-pay | Admitting: Internal Medicine

## 2022-04-09 MED ORDER — HYDROCODONE-ACETAMINOPHEN 10-325 MG PO TABS: 1.0000 | ORAL_TABLET | Freq: Three times a day (TID) | ORAL | 0 refills | Status: DC | PRN

## 2022-04-09 MED ORDER — PREDNISONE 10 MG PO TABS: 10.0000 mg | ORAL_TABLET | Freq: Every day | ORAL | 0 refills | Status: DC | PRN

## 2022-04-09 NOTE — Telephone Encounter (Signed)
He had full blood panel in June. There is nothing else that we need to check. However, his kidney functions had gotten worse, he does need to follow up with nephrologist to check on his kidney functions.

## 2022-04-09 NOTE — Telephone Encounter (Signed)
Pt's pharmacy is requesting a prior auth for pt's medication mexiletine. Please address

## 2022-04-09 NOTE — Telephone Encounter (Signed)
Pt c/o medication issue:  1. Name of Medication:  mexiletine (MEXITIL) 250 MG capsule  2. How are you currently taking this medication (dosage and times per day)?   3. Are you having a reaction (difficulty breathing--STAT)?   4. What is your medication issue?   Patient states a PA is needed before the medication is distributed to the patient. Otherwise it will cost him around $800. Please assist.

## 2022-04-09 NOTE — Addendum Note (Signed)
Addended by: Lelon Huh E on: 04/09/2022 12:58 PM   Modules accepted: Orders

## 2022-04-14 ENCOUNTER — Telehealth: Payer: Self-pay | Admitting: Internal Medicine

## 2022-04-14 MED ORDER — MEXILETINE HCL 250 MG PO CAPS: 250.0000 mg | ORAL_CAPSULE | Freq: Three times a day (TID) | ORAL | 2 refills | Status: DC

## 2022-04-14 NOTE — Telephone Encounter (Signed)
**Note De-Identified Racquel Arkin Obfuscation** I started a Urgent Mexiletine PA through covermymeds. Key: [id number]

## 2022-04-14 NOTE — Telephone Encounter (Signed)
Spoke with patient to make him aware that his medication, Mexiletine, prior authorization has been sent to his pharmacy.

## 2022-04-14 NOTE — Telephone Encounter (Signed)
Pt c/o medication issue:  1. Name of Medication:      mexiletine (MEXITIL) 250 MG capsule    2. How are you currently taking this medication (dosage and times per day)?   3. Are you having a reaction (difficulty breathing--STAT)?   4. What is your medication issue? Pt states he needs prior authorization for this medication

## 2022-04-14 NOTE — Telephone Encounter (Signed)
**Note De-Identified Rayburn Mundis Obfuscation** I started a Urgent Mexiletine PA through covermymeds. Key: [id number]

## 2022-04-14 NOTE — Telephone Encounter (Signed)
**Note De-Identified Raine Elsass Obfuscation** [name] Key: [id number] - PA Case ID: [id number] Outcome Approved today Coverage Starts on: 08/03/2021 12:00:00 AM, Coverage Ends on: 08/02/2022 12:00:00 AM. Drug Mexiletine HCl 250MG  capsules Form [employer] Electronic PA Form  I have notified [id number] [address], [place] (Ph: [phone]) of this approval.

## 2022-04-14 NOTE — Addendum Note (Signed)
Addended by: Wadie Lessen on: 04/14/2022 02:52 PM   Modules accepted: Orders

## 2022-04-16 ENCOUNTER — Telehealth: Payer: Self-pay | Admitting: Internal Medicine

## 2022-04-16 MED ORDER — MEXILETINE HCL 250 MG PO CAPS: 250.0000 mg | ORAL_CAPSULE | Freq: Three times a day (TID) | ORAL | 1 refills | Status: DC

## 2022-04-16 NOTE — Telephone Encounter (Signed)
Patient states he is returning an additional call from RN. Call transferred to RN.

## 2022-04-16 NOTE — Telephone Encounter (Signed)
Spoke with pt who states Mexiletine cost with his new insurance is $297.00.  Pt states he cannot afford this and is requesting a less expensive medication.  Pt states he is not in the coverage gap.  This medication is a tier 4 medicine.  Pt states this is the cost even with his prior authorization.  .  Pt states he has been out of medication x 3 days. Pt advised Dr [name] is out of the office through next week but will forward message to Oda Kilts, PA-C for review and recommendation.

## 2022-04-16 NOTE — Telephone Encounter (Signed)
Spoke with pt and advised of recommendation from [facility], PA-C as below.  Pt is willing to use [facility] to fill a 30 day supply of Mexiletine until he can see Dr [name] on 05/06/2022.  New order placed for Mexiletine 250mg  - 1 tablet by mouth tid #90 with 1RF.  Pt verbalizes understanding and agrees with current plan.

## 2022-04-16 NOTE — Telephone Encounter (Signed)
Pt c/o medication issue:  1. Name of Medication: mexiletine (MEXITIL) 250 MG capsule  2. How are you currently taking this medication (dosage and times per day)? As prescribed  3. Are you having a reaction (difficulty breathing--STAT)? No   4. What is your medication issue? Patient switched insurance and the cost of this medication has now gone from $10 to $200 which he cannot afford.

## 2022-04-17 ENCOUNTER — Telehealth: Payer: Self-pay | Admitting: Internal Medicine

## 2022-04-17 MED ORDER — MEXILETINE HCL 150 MG PO CAPS: 150.0000 mg | ORAL_CAPSULE | Freq: Three times a day (TID) | ORAL | 1 refills | Status: DC

## 2022-04-17 NOTE — Telephone Encounter (Signed)
Spoke with pt who states he is out of Mexiletine and [facility] [place] RN called 4 other pharmacies in the [place] area and none are stocked with Mexiletine 250mg .  [facility] in [place] does have Mexiletine 150mg .  RN spoke with  Oda Kilts, PA-C who orders Mexiletine 150mg  - 1 tablet by mouth TID.  Rx sent to pharmacy and pt notified of dose change.  Pt verbalizes understanding and agrees with current plan.

## 2022-04-17 NOTE — Telephone Encounter (Signed)
Pt c/o medication issue:  1. Name of Medication: mexiletine (MEXITIL) 250 MG capsule  2. How are you currently taking this medication (dosage and times per day)? Not currently taking  3. Are you having a reaction (difficulty breathing--STAT)? no  4. What is your medication issue? Patient states the medication is on back order and he will not be able to get it until Monday. He would like to know if he can go without it until then.

## 2022-04-20 ENCOUNTER — Ambulatory Visit (INDEPENDENT_AMBULATORY_CARE_PROVIDER_SITE_OTHER): Payer: Medicare PPO

## 2022-04-20 ENCOUNTER — Ambulatory Visit: Payer: Medicare PPO | Admitting: Physician Assistant

## 2022-04-20 DIAGNOSIS — I5022 Chronic systolic (congestive) heart failure: Secondary | ICD-10-CM

## 2022-04-20 DIAGNOSIS — Z95810 Presence of automatic (implantable) cardiac defibrillator: Secondary | ICD-10-CM | POA: Diagnosis not present

## 2022-04-24 NOTE — Progress Notes (Addendum)
EPIC Encounter for ICM Monitoring  Patient Name: Richard Cook is a 66 y.o. male Date: 04/24/2022 Primary Care Physican: Birdie Sons, MD Primary Cardiologist: [name] Electrophysiologist: [name] Nephrologist: [facility] 12/19/2021 Office Weight: 173 lbs     Transmission reviewed.    Optivol thoracic impedance suggesting intermittent days with possible fluid accumulation within the last month.   Prescribed: Furosemide 40 mg take 2 tablets (80 mg total) by mouth daily.     Labs: 01/19/2022 Creatinine 3.57, BUN 53, Potassium 4.1, Sodium 141, GFR 18 10/29/2021 Creatinine 2.97, BUN 42, Potassium 3.8, Sodium 141, GFR 23 Care Everywhere 09/02/2021 Creatinine 3.09, BUN 55, Potassium 4.3, Sodium 141  Care Everywhere 07/17/2021 Creatinine 3.79, BUN 47, Potassium 3.8, Sodium 141, GFR 17 A complete set of results can be found in Results Review.   Recommendations:  No changes.    Follow-up plan: ICM clinic phone appointment on 05/25/2022.   91 day device clinic remote transmission 05/15/2022.     EP/Cardiology Office Visits:  05/06/2022 with Dr. [name].     Copy of ICM check sent to Dr. [name].   3 month ICM trend: 04/20/2022.    12-14 Month ICM trend:     Rosalene Billings, RN 04/24/2022 4:58 PM

## 2022-04-29 ENCOUNTER — Ambulatory Visit: Payer: Self-pay | Admitting: *Deleted

## 2022-04-29 NOTE — Telephone Encounter (Signed)
Summary: scalp / rx req   The patient would like to speak with a member of practice staff   The patient shares that their scalp has been itching for roughly two weeks   The patient would like to be prescribed something for their discomfort   Please contact further when possible     Chief Complaint: scalp itching Symptoms: itching of scalp- patient would like to have same medication he had in past- 01/22/20 Frequency: itching started 2 weeks ago  Pertinent Negatives: Patient denies redness, scaling Disposition: [] ED /[] Urgent Care (no appt availability in office) / [] Appointment(In office/virtual)/ []  [facility]/ [] Home Care/ [] Refused Recommended Disposition /[] [facility]/ []  Follow-up with PCP Additional Notes: Patient has moved to [place] ,[place] and  is requesting Rx for scalp itching. Patient states the itching is pretty severe and he has used several OTC shampoos without relief. Patient states he does have dermatology appointment at the end of October- but can't wait that long. Patient advised he may need to be seen- patient states he can do virtual visit if needed- but wants to know if medication can be sent in. Answer Assessment - Initial Assessment Questions 1. DESCRIPTION: "Describe the itching you are having." "Where is it located?"     Scalp itching 2. SEVERITY: "How bad is it?"    - MILD: Doesn't interfere with normal activities.   - MODERATE-SEVERE: Interferes with work, school, sleep, or other activities.      Not red or irritated- has tried OTC remedies 3. SCRATCHING: "Are there any scratch marks? Bleeding?"     Yes-head is sore 4. ONSET: "When did the itching begin?"      2 weeks 5. CAUSE: "What do you think is causing the itching?"      unsure 6. OTHER SYMPTOMS: "Do you have any other symptoms?"      No other symptoms 7. PREGNANCY: "Is there any chance you are pregnant?" "When was your last menstrual period?"  Protocols used: Itching -  Localized-A-AH

## 2022-04-29 NOTE — Telephone Encounter (Signed)
Please advise whether patient needs to schedule appointment to be seen. If so, is virtual visit ok?

## 2022-04-29 NOTE — Telephone Encounter (Signed)
Have no idea what to treat him without exam. Needs office visit.

## 2022-04-30 NOTE — Telephone Encounter (Signed)
Lmtcb to schedule appt. PEC please schedule when pt calls back.

## 2022-05-01 NOTE — Telephone Encounter (Signed)
Called, spoke with patient in regards to him needing an OV for his current symptoms of scalp itching. He states that he lives in [place] and that it is a 2 hour drive to [place] and would like to know if he could be worked into Dr. Maralyn Sago schedule for 05/06/22 between 10 am and 1 pm.  Or after 2:30 pm. He has other provider appointments that day and will be in [place].He states that he only wants to see Dr. Caryn Section. He states that he was treated for scalp itching by Dr.Fisher a year ago and was treated with Prednisone. He states that stop the itching. Please advise patient.

## 2022-05-04 ENCOUNTER — Telehealth: Payer: Self-pay | Admitting: Family Medicine

## 2022-05-04 NOTE — Telephone Encounter (Signed)
Scheduled for 05/06/22 with Dr.Fisher at 11:20 am. Contacted patient and LVM advising of appointment and to call back if time does not work for him. CRM created. Ok for [facility] to advise

## 2022-05-04 NOTE — Telephone Encounter (Signed)
Pt is calling back to confirm that he did receive the request for an appt.

## 2022-05-04 NOTE — Telephone Encounter (Signed)
Pt is calling to schedule appointment. Pt states that he will be coming to [place] on 05/06/22. Advised that Dr. Caryn Section does not have any appt on Wednesday. Pt is requesting to be worked in declined to see another provider. And reports that his head is itching badly. And he is requesting something to stop the itching prior to Wednesday. Pt is requesting 2-3 mins of Dr. Maralyn Sago time on Wednesday. When he is willing to drive 2 hours. Pt currently has an 8:15am. And 1:30p on Wednesday. CB- [phone]

## 2022-05-05 NOTE — Progress Notes (Signed)
I,Sulibeya S Dimas,acting as a scribe for Lelon Huh, MD.,have documented all relevant documentation on the behalf of Lelon Huh, MD,as directed by  Lelon Huh, MD while in the presence of Lelon Huh, MD.     Established patient visit   Patient: Richard Cook   DOB: 1955/08/11   66 y.o. Male  MRN: [id number] Visit Date: 05/06/2022  Today's healthcare provider: Lelon Huh, MD   Chief Complaint  Patient presents with   scalp irritation   Subjective    HPI  Patient here today C/O scalp irritation. Patient reports reports using OTC creams. He reports some relief with witch hazel and cleared up with prednisone in the past. He states he is working on getting in to see a Paediatric nurse in [place].   He also has stage 5 kidney disease. Lab Results  Component Value Date   CREATININE 3.57 (H) 01/19/2022   BUN 53 (H) 01/19/2022   NA 141 01/19/2022   K 4.1 01/19/2022   CL 100 01/19/2022   CO2 21 01/19/2022   Lab Results  Component Value Date   WBC 9.2 05/28/2021   HGB 14.1 05/28/2021   HCT 41.7 05/28/2021   MCV 95.0 05/28/2021   PLT 228 05/28/2021   He states he does have appointment scheduled with nephrologist at the end of this month.     Medications: Outpatient Medications Prior to Visit  Medication Sig   allopurinol (ZYLOPRIM) 100 MG tablet Take 2 tablets (200 mg total) by mouth daily.   aspirin EC 81 MG tablet Take 1 tablet (81 mg total) by mouth daily. Swallow whole.   azelastine (ASTELIN) 0.1 % nasal spray Place 2 sprays into both nostrils 2 (two) times daily. Use in each nostril as directed   bisoprolol (ZEBETA) 5 MG tablet Take 1 tablet (5 mg total) by mouth daily.   cholecalciferol (VITAMIN D3) 25 MCG (1000 UNIT) tablet Take 1,000 Units by mouth daily.   clonazePAM (KLONOPIN) 1 MG tablet TAKE ONE TABLET EVERY AFTERNOON AND 1 & 1/2 TABLET AT BEDTIME   furosemide (LASIX) 40 MG tablet Take 80 mg by mouth daily.   HYDROcodone-acetaminophen (NORCO)  10-325 MG tablet Take 1 tablet by mouth every 8 (eight) hours as needed. PATIENT NEEDS TO SCHEDULE OFFICE VISIT FOR FOLLOW UP   ibuprofen (ADVIL) 200 MG tablet Take 400 mg by mouth every 6 (six) hours as needed for headache or moderate pain.   ipratropium (ATROVENT) 0.03 % nasal spray PLACE 2 SPRAYS INTO BOTH NOSTRILS EVERY 12 (TWELVE) HOURS.   levothyroxine (SYNTHROID) 88 MCG tablet Take 1 tablet (88 mcg total) by mouth daily before breakfast.   mexiletine (MEXITIL) 150 MG capsule Take 1 capsule (150 mg total) by mouth 3 (three) times daily.   Omega-3 Fatty Acids (FISH OIL) 1200 MG CAPS Take 1,200 mg by mouth daily.   predniSONE (DELTASONE) 10 MG tablet Take 1 tablet (10 mg total) by mouth daily as needed.   No facility-administered medications prior to visit.    Review of Systems  HENT:         Dry mouth  Respiratory:  Negative for chest tightness.   Cardiovascular:  Negative for chest pain.  Skin:        Scalp irritation and itching      Objective    BP 96/72 (BP Location: Right Arm, Patient Position: Sitting, Cuff Size: Large)   Pulse 73   Temp 98.4 F (36.9 C) (Oral)   Resp 16   Wt 177 lb (  80.3 kg)   BMI 25.40 kg/m    Physical Exam   General appearance: Well developed, well nourished male, cooperative and in no acute distress Head: Normocephalic, without obvious abnormality, atraumatic Respiratory: Respirations even and unlabored, normal respiratory rate Extremities: All extremities are intact.  Skin: mild inflammation and flaking of scalp. Psych: Appropriate mood and affect. Neurologic: Mental status: Alert, oriented to person, place, and time, thought content appropriate.   Assessment & Plan     1. Dermatosis of scalp try ketoconazole (NIZORAL) 2 % shampoo; Apply 1 Application topically 2 (two) times a week.  Dispense: 120 mL; Refill: 0  He states prednisone always seems to clear it up. If not improving with above will try predniSONE (DELTASONE) 10 MG tablet; 6  tablets for 2 days, then 5 for 2 days, then 4 for 2 days, then 3 for 2 days, then 2 for 2 days, then 1 for 2 days.  Dispense: 42 tablet; Refill: 1 He also reports that prednisone is the only thing that helps his gout attacks.   2. CKD (chronic kidney disease), stage IV ([place]) Our lab is now closed, however patient states he has an upcoming appointment with nephrology and will be having labs done there.   3. Primary insomnia /Anxiety state refill clonazePAM (KLONOPIN) 1 MG tablet; Take 1 tablet (1 mg total) by mouth 3 (three) times daily as needed for anxiety.  Dispense: 90 tablet; Refill: 4  4. Vasomotor rhinitis refill ipratropium (ATROVENT) 0.03 % nasal spray; Place 2 sprays into both nostrils every 12 (twelve) hours.  Dispense: 90 mL; Refill: 5  5. Need for influenza vaccination  - Flu Vaccine QUAD High Dose(Fluad)  6. Need for pneumococcal 20-valent conjugate vaccination  - Pneumococcal conjugate vaccine 20-valent     The entirety of the information documented in the History of Present Illness, Review of Systems and Physical Exam were personally obtained by me. Portions of this information were initially documented by the CMA and reviewed by me for thoroughness and accuracy.     Lelon Huh, MD  [facility] [phone] (phone) [phone] (fax)  [place]

## 2022-05-06 ENCOUNTER — Encounter: Payer: Self-pay | Admitting: Internal Medicine

## 2022-05-06 ENCOUNTER — Ambulatory Visit: Payer: Medicare PPO | Attending: Physician Assistant | Admitting: Internal Medicine

## 2022-05-06 ENCOUNTER — Encounter: Payer: Self-pay | Admitting: Family Medicine

## 2022-05-06 ENCOUNTER — Ambulatory Visit (INDEPENDENT_AMBULATORY_CARE_PROVIDER_SITE_OTHER): Payer: Medicare PPO | Admitting: Family Medicine

## 2022-05-06 VITALS — BP 108/76 | HR 71 | Ht 70.0 in | Wt 177.0 lb

## 2022-05-06 VITALS — BP 96/72 | HR 73 | Temp 98.4°F | Resp 16 | Wt 177.0 lb

## 2022-05-06 DIAGNOSIS — J300 Vasomotor rhinitis: Secondary | ICD-10-CM

## 2022-05-06 DIAGNOSIS — N184 Chronic kidney disease, stage 4 (severe): Secondary | ICD-10-CM | POA: Diagnosis not present

## 2022-05-06 DIAGNOSIS — Z95810 Presence of automatic (implantable) cardiac defibrillator: Secondary | ICD-10-CM | POA: Diagnosis not present

## 2022-05-06 DIAGNOSIS — I255 Ischemic cardiomyopathy: Secondary | ICD-10-CM | POA: Diagnosis not present

## 2022-05-06 DIAGNOSIS — L989 Disorder of the skin and subcutaneous tissue, unspecified: Secondary | ICD-10-CM

## 2022-05-06 DIAGNOSIS — F411 Generalized anxiety disorder: Secondary | ICD-10-CM | POA: Diagnosis not present

## 2022-05-06 DIAGNOSIS — I472 Ventricular tachycardia, unspecified: Secondary | ICD-10-CM | POA: Diagnosis not present

## 2022-05-06 DIAGNOSIS — Z23 Encounter for immunization: Secondary | ICD-10-CM | POA: Diagnosis not present

## 2022-05-06 DIAGNOSIS — F5101 Primary insomnia: Secondary | ICD-10-CM

## 2022-05-06 MED ORDER — KETOCONAZOLE 2 % EX SHAM: 1.0000 | MEDICATED_SHAMPOO | CUTANEOUS | 0 refills | Status: DC

## 2022-05-06 MED ORDER — PREDNISONE 10 MG PO TABS: ORAL_TABLET | ORAL | 1 refills | Status: DC

## 2022-05-06 MED ORDER — IPRATROPIUM BROMIDE 0.03 % NA SOLN: 2.0000 | Freq: Two times a day (BID) | NASAL | 5 refills | Status: DC

## 2022-05-06 MED ORDER — CLONAZEPAM 1 MG PO TABS: 1.0000 mg | ORAL_TABLET | Freq: Three times a day (TID) | ORAL | 4 refills | Status: DC | PRN

## 2022-05-06 NOTE — Patient Instructions (Signed)
Medication Instructions:  Your physician recommends that you continue on your current medications as directed. Please refer to the Current Medication list given to you today.  *If you need a refill on your cardiac medications before your next appointment, please call your pharmacy*   Lab Work: None ordered.  If you have labs (blood work) drawn today and your tests are completely normal, you will receive your results only by: MyChart Message (if you have MyChart) OR A paper copy in the mail If you have any lab test that is abnormal or we need to change your treatment, we will call you to review the results.   Testing/Procedures: None ordered.    Follow-Up: At [facility], you and your health needs are our priority.  As part of our continuing mission to provide you with exceptional heart care, we have created designated Provider Care Teams.  These Care Teams include your primary Cardiologist (physician) and Advanced Practice Providers (APPs -  Physician Assistants and Nurse Practitioners) who all work together to provide you with the care you need, when you need it.  We recommend signing up for the patient portal called "MyChart".  Sign up information is provided on this After Visit Summary.  MyChart is used to connect with patients for Virtual Visits (Telemedicine).  Patients are able to view lab/test results, encounter notes, upcoming appointments, etc.  Non-urgent messages can be sent to your provider as well.   To learn more about what you can do with MyChart, go to [email or web address].    Your next appointment:   6 months with Dr Klein  Important Information About Sugar       

## 2022-05-06 NOTE — Patient Instructions (Signed)
Please review the attached list of medications and notify my office if there are any errors.   Please bring all of your medications to every appointment so we can make sure that our medication list is the same as yours.   Try OTC Biotene lozenges for dry mouth
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
## 2022-05-15 ENCOUNTER — Ambulatory Visit (INDEPENDENT_AMBULATORY_CARE_PROVIDER_SITE_OTHER): Payer: Medicare PPO

## 2022-05-15 DIAGNOSIS — I255 Ischemic cardiomyopathy: Secondary | ICD-10-CM | POA: Diagnosis not present

## 2022-05-18 LAB — CUP PACEART REMOTE DEVICE CHECK
Battery Remaining Longevity: 30 mo
Battery Voltage: 2.94 V
Brady Statistic RV Percent Paced: 0.01 %
Date Time Interrogation Session: 20231013193828
HighPow Impedance: 80 Ohm
Implantable Lead Implant Date: 20150318
Implantable Lead Location: 753860
Implantable Lead Model: 181
Implantable Lead Serial Number: 327195
Implantable Pulse Generator Implant Date: 20150318
Lead Channel Impedance Value: 665 Ohm
Lead Channel Impedance Value: 703 Ohm
Lead Channel Pacing Threshold Amplitude: 0.75 V
Lead Channel Pacing Threshold Pulse Width: 0.4 ms
Lead Channel Sensing Intrinsic Amplitude: 9.625 mV
Lead Channel Sensing Intrinsic Amplitude: 9.625 mV
Lead Channel Setting Pacing Amplitude: 2 V
Lead Channel Setting Pacing Pulse Width: 0.4 ms
Lead Channel Setting Sensing Sensitivity: 0.3 mV

## 2022-05-20 NOTE — Progress Notes (Signed)
Remote ICD transmission.   

## 2022-05-22 ENCOUNTER — Telehealth: Payer: Self-pay | Admitting: Family Medicine

## 2022-05-22 DIAGNOSIS — M5489 Other dorsalgia: Secondary | ICD-10-CM

## 2022-05-22 MED ORDER — HYDROCODONE-ACETAMINOPHEN 10-325 MG PO TABS: 1.0000 | ORAL_TABLET | Freq: Three times a day (TID) | ORAL | 0 refills | Status: DC | PRN

## 2022-05-22 NOTE — Telephone Encounter (Signed)
Requested medication (s) are due for refill today: no  Requested medication (s) are on the active medication list: yes  Last refill:  04/09/22  Future visit scheduled: no  Notes to clinic:  Unable to refill per protocol, cannot delegate.      Requested Prescriptions  Pending Prescriptions Disp Refills   HYDROcodone-acetaminophen (NORCO) 10-325 MG tablet 90 tablet 0    Sig: Take 1 tablet by mouth every 8 (eight) hours as needed. PATIENT NEEDS TO SCHEDULE OFFICE VISIT FOR FOLLOW UP     Not Delegated - Analgesics:  Opioid Agonist Combinations Failed - 05/22/2022  1:35 PM      Failed - This refill cannot be delegated      Failed - Urine Drug Screen completed in last 360 days      Passed - Valid encounter within last 3 months    Recent Outpatient Visits           2 weeks ago Dermatosis of scalp   [facility] Birdie Sons, MD   4 months ago Chronic gout of ankle, unspecified cause, unspecified laterality   [facility] [place], [place], PA-C   5 months ago Other specified hypothyroidism   [facility] Birdie Sons, MD   8 months ago Back pain without sciatica   [facility] Birdie Sons, MD   1 year ago Primary insomnia   [facility] Birdie Sons, MD

## 2022-05-22 NOTE — Telephone Encounter (Signed)
Medication Refill - Medication: HYDROcodone-acetaminophen (NORCO) 10-325 MG tablet    Has the patient contacted their pharmacy? yes (Agent: If no, request that the patient contact the pharmacy for the refill. If patient does not wish to contact the pharmacy document the reason why and proceed with request.) (Agent: If yes, when and what did the pharmacy advise?)contact pcp  Preferred Pharmacy (with phone number or street name): [id number] [address], [place]  Phone:  [phone] Fax:  [phone]  Has the patient been seen for an appointment in the last year OR does the patient have an upcoming appointment? yes  Agent: Please be advised that RX refills may take up to 3 business days. We ask that you follow-up with your pharmacy.

## 2022-05-25 ENCOUNTER — Ambulatory Visit (INDEPENDENT_AMBULATORY_CARE_PROVIDER_SITE_OTHER): Payer: Medicare PPO

## 2022-05-25 DIAGNOSIS — Z95810 Presence of automatic (implantable) cardiac defibrillator: Secondary | ICD-10-CM

## 2022-05-25 DIAGNOSIS — I5022 Chronic systolic (congestive) heart failure: Secondary | ICD-10-CM

## 2022-05-25 MED ORDER — OXYCODONE-ACETAMINOPHEN 5-325 MG PO TABS: 1.0000 | ORAL_TABLET | Freq: Three times a day (TID) | ORAL | 0 refills | Status: DC | PRN

## 2022-05-25 NOTE — Addendum Note (Signed)
Addended by: Birdie Sons on: 05/25/2022 02:55 PM   Modules accepted: Orders

## 2022-05-25 NOTE — Telephone Encounter (Signed)
Pt states that [facility] [place] [facility] this medication and he should try to get Percocet 5?  Pls advise [phone] Pt states after tomorrow he will be in withdrawal.  [id number] [place] [address], [place]  [address] [place] [address]  Phone: [phone] Fax: [phone]  Hours: Not open 24 hours

## 2022-05-26 NOTE — Progress Notes (Signed)
EPIC Encounter for ICM Monitoring  Patient Name: Richard Cook is a 66 y.o. male Date: 05/26/2022 Primary Care Physican: Birdie Sons, MD Primary Cardiologist: [name] Electrophysiologist: [name] Nephrologist: [facility] 12/19/2021 Office Weight: 173 lbs     Transmission reviewed.    Optivol thoracic impedance normal but was suggesting intermittent days with possible fluid accumulation within the last month.   Prescribed: Furosemide 40 mg take 2 tablets (80 mg total) by mouth daily.     Labs: 01/19/2022 Creatinine 3.57, BUN 53, Potassium 4.1, Sodium 141, GFR 18 10/29/2021 Creatinine 2.97, BUN 42, Potassium 3.8, Sodium 141, GFR 23 Care Everywhere 09/02/2021 Creatinine 3.09, BUN 55, Potassium 4.3, Sodium 141  Care Everywhere 07/17/2021 Creatinine 3.79, BUN 47, Potassium 3.8, Sodium 141, GFR 17 A complete set of results can be found in Results Review.   Recommendations:  No changes.    Follow-up plan: ICM clinic phone appointment on 06/29/2022.   91 day device clinic remote transmission 08/14/2022.     EP/Cardiology Office Visits: Recall 11/02/2022 with Dr. [name] for 6 month f/u.     Copy of ICM check sent to Dr. [name].   3 month ICM trend: 05/25/2022.    12-14 Month ICM trend:     Rosalene Billings, RN 05/26/2022 7:02 AM

## 2022-05-27 ENCOUNTER — Telehealth: Payer: Self-pay

## 2022-05-27 DIAGNOSIS — R159 Full incontinence of feces: Secondary | ICD-10-CM

## 2022-05-27 NOTE — Telephone Encounter (Signed)
Recommend referral to gastroenterology for fecal leakage. In the meantime he can try vitamin D supplements at 1000 units every day. Should also completely avoid fatty foods, especially butters and oils.

## 2022-05-27 NOTE — Addendum Note (Signed)
Addended by: Randal Buba on: 05/27/2022 03:06 PM   Modules accepted: Orders

## 2022-05-27 NOTE — Telephone Encounter (Signed)
Patient reports having problems with stool incontinence x 1 month. Initially he was having problems with constipation. He was told by a friend to drink more water. Patient says he started drinking more water, but then started having leaking of stool onto his underwear. Patient says this usually happens every morning when he goes hiking. Patient says that he didn't go hiking today and it still happened. Patient noticed that it occurs in the mornings, 30 minutes after eating. This morning he had a bagle with butter, and 30 minutes later had stool leaking onto his underwear. Patient says he did an IT consultant and found that causes could be a Vit d deficiency, or a problem with his colon. He has been trying recommended exercises he found off the Internet. Patient would like a "colon test" done. He says he is 5 hours away from our office and wants to know if Dr. Caryn Section recommends an office visit, whether it could be virtual. Please advise.

## 2022-05-27 NOTE — Telephone Encounter (Signed)
Patient advised. Patient admits to consuming a high amount of butter within the past month.  He wants to try taking Vitamin D supplements and avoiding butter and oils for the next month to see in symptoms resolve. He wanted to hold off on the GI referral. I advised him that GI specialist are usually booked out several months, and its usually better to get an appointment scheduled in case he changed his mind later. He agreed to have [place] place the GI referral since appointments can be out several months. Order for GI referral placed. Please schedule.

## 2022-05-27 NOTE — Telephone Encounter (Signed)
Copied from [place] [id number]. Topic: General - Other >> May 27, 2022  8:37 AM [id number] J wrote: Reason for CRM: pt called in to be advised. Pt says that he was diagnosed with a condition that started about a month ago. Pt says that its quite embarrassing to discuss with me and would like a call back from cma first before scheduling an appt.   CB: [phone] - please assist pt further .

## 2022-05-29 ENCOUNTER — Telehealth: Payer: Self-pay | Admitting: Internal Medicine

## 2022-05-29 NOTE — Telephone Encounter (Signed)
Patient scheduled for ov but is requesting to speak with a nurse to see if he can do Virtual . Please advise.

## 2022-06-01 NOTE — Telephone Encounter (Signed)
Past due his follow up of median arcuate ligament syndrome. Do you need an in-person visit?

## 2022-06-01 NOTE — Telephone Encounter (Signed)
Called the patient. No answer. Left a message that he will need to set up his My Chart. We can do the visit virtually, and to call back if he needs a new code to set up the My Chart account.

## 2022-06-03 ENCOUNTER — Other Ambulatory Visit: Payer: Self-pay | Admitting: Family Medicine

## 2022-06-03 DIAGNOSIS — L989 Disorder of the skin and subcutaneous tissue, unspecified: Secondary | ICD-10-CM

## 2022-06-23 ENCOUNTER — Telehealth: Payer: Self-pay | Admitting: Family Medicine

## 2022-06-23 MED ORDER — OXYCODONE-ACETAMINOPHEN 7.5-325 MG PO TABS: 1.0000 | ORAL_TABLET | Freq: Three times a day (TID) | ORAL | 0 refills | Status: DC | PRN

## 2022-06-23 NOTE — Telephone Encounter (Signed)
Pt stated his medicine is not strong enough so he would like to requests that the Rx for oxyCODONE-acetaminophen (PERCOCET/ROXICET) 5-325 MG tablet be changed to oxyCODONE-acetaminophen (PERCOCET/ROXICET) 7.5-325 MG tablet

## 2022-06-23 NOTE — Telephone Encounter (Signed)
Please advise 

## 2022-06-27 ENCOUNTER — Other Ambulatory Visit: Payer: Self-pay | Admitting: Family Medicine

## 2022-06-27 ENCOUNTER — Other Ambulatory Visit: Payer: Self-pay | Admitting: Internal Medicine

## 2022-06-27 DIAGNOSIS — L989 Disorder of the skin and subcutaneous tissue, unspecified: Secondary | ICD-10-CM

## 2022-06-27 DIAGNOSIS — E790 Hyperuricemia without signs of inflammatory arthritis and tophaceous disease: Secondary | ICD-10-CM

## 2022-06-29 ENCOUNTER — Ambulatory Visit (INDEPENDENT_AMBULATORY_CARE_PROVIDER_SITE_OTHER): Payer: Medicare PPO

## 2022-06-29 DIAGNOSIS — Z95810 Presence of automatic (implantable) cardiac defibrillator: Secondary | ICD-10-CM

## 2022-06-29 DIAGNOSIS — I5022 Chronic systolic (congestive) heart failure: Secondary | ICD-10-CM | POA: Diagnosis not present

## 2022-07-02 ENCOUNTER — Telehealth: Payer: Self-pay | Admitting: Family Medicine

## 2022-07-02 ENCOUNTER — Telehealth: Payer: Self-pay

## 2022-07-02 NOTE — Telephone Encounter (Signed)
Requested medication (s) are due for refill today: routing for review  Requested medication (s) are on the active medication list: yes  Last refill:  05/28/21  Future visit scheduled:yes  Notes to clinic:  Unable to refill per protocol, last refill by another provider. Historical provider.     Requested Prescriptions  Pending Prescriptions Disp Refills   furosemide (LASIX) 40 MG tablet 30 tablet     Sig: Take 2 tablets (80 mg total) by mouth daily.     Cardiovascular:  Diuretics - Loop Failed - 07/02/2022  1:37 PM      Failed - Cr in normal range and within 180 days    Creat  Date Value Ref Range Status  04/07/2017 3.47 (H) 0.70 - 1.25 mg/dL Final    Comment:    For patients >78 years of age, the reference limit for Creatinine is approximately 13% higher for people identified as African-American. .    Creatinine, Ser  Date Value Ref Range Status  01/19/2022 3.57 (H) 0.76 - 1.27 mg/dL Final   Creatinine, Urine  Date Value Ref Range Status  05/13/2016 29.78 mg/dL Final         Failed - Mg Level in normal range and within 180 days    Magnesium  Date Value Ref Range Status  08/24/2020 1.9 1.7 - 2.4 mg/dL Final    Comment:    Performed at [facility], [place] [address], [place], [place] [address]         Passed - K in normal range and within 180 days    Potassium  Date Value Ref Range Status  01/19/2022 4.1 3.5 - 5.2 mmol/L Final         Passed - Ca in normal range and within 180 days    Calcium  Date Value Ref Range Status  01/19/2022 9.6 8.6 - 10.2 mg/dL Final   Calcium, Ion  Date Value Ref Range Status  02/28/2014 1.20 1.12 - 1.23 mmol/L Final         Passed - Na in normal range and within 180 days    Sodium  Date Value Ref Range Status  01/19/2022 141 134 - 144 mmol/L Final         Passed - Cl in normal range and within 180 days    Chloride  Date Value Ref Range Status  01/19/2022 100 96 - 106 mmol/L Final         Passed -  Last BP in normal range    BP Readings from Last 1 Encounters:  05/06/22 96/72         Passed - Valid encounter within last 6 months    Recent Outpatient Visits           1 month ago Dermatosis of scalp   [facility] Birdie Sons, MD   5 months ago Chronic gout of ankle, unspecified cause, unspecified laterality   [facility] [place], [place], PA-C   6 months ago Other specified hypothyroidism   [facility] Birdie Sons, MD   9 months ago Back pain without sciatica   [facility] Birdie Sons, MD   1 year ago Primary insomnia   [facility] Birdie Sons, MD

## 2022-07-02 NOTE — Telephone Encounter (Signed)
Pt stated his doctor is on medical leave and he does not have an appt with a new doctor until December 27th. Pt requests that pcp approve refill for the following medication  Medication Refill - Medication: furosemide (LASIX) 40 MG tablet   Has the patient contacted their pharmacy? No.  Preferred Pharmacy (with phone number or street name):  [id number] [address], [employer] [employer] Phone: [phone]  Fax: [phone]     Has the patient been seen for an appointment in the last year OR does the patient have an upcoming appointment? Yes.    Agent: Please be advised that RX refills may take up to 3 business days. We ask that you follow-up with your pharmacy.

## 2022-07-02 NOTE — Telephone Encounter (Signed)
Pt agreed to send missed ICM transmission. 

## 2022-07-03 ENCOUNTER — Telehealth: Payer: Self-pay

## 2022-07-03 NOTE — Progress Notes (Signed)
EPIC Encounter for ICM Monitoring  Patient Name: Richard Cook is a 66 y.o. male Date: 07/03/2022 Primary Care Physican: Birdie Sons, MD Primary Cardiologist: [name] Electrophysiologist: [name] Nephrologist: [facility] 05/06/2022 Office Weight: 177 lbs     Attempted call to patient and unable to reach.  Left message to return call. Transmission reviewed.    Optivol thoracic impedance suggesting possible fluid accumulation since 11/19.   Prescribed: Furosemide 40 mg take 2 tablets (80 mg total) by mouth daily.     Labs: 01/19/2022 Creatinine 3.57, BUN 53, Potassium 4.1, Sodium 141, GFR 18 10/29/2021 Creatinine 2.97, BUN 42, Potassium 3.8, Sodium 141, GFR 23 Care Everywhere 09/02/2021 Creatinine 3.09, BUN 55, Potassium 4.3, Sodium 141  Care Everywhere 07/17/2021 Creatinine 3.79, BUN 47, Potassium 3.8, Sodium 141, GFR 17 A complete set of results can be found in Results Review.   Recommendations:  Unable to reach.     Follow-up plan: ICM clinic phone appointment on 07/08/2022 to recheck fluid levels.   91 day device clinic remote transmission 08/14/2022.     EP/Cardiology Office Visits: Recall 11/02/2022 with Dr. [name] for 6 month f/u.     Copy of ICM check sent to Dr. [name].   3 month ICM trend: 07/02/2022.    12-14 Month ICM trend:     Rosalene Billings, RN 07/03/2022 10:10 AM

## 2022-07-03 NOTE — Telephone Encounter (Signed)
Remote ICM transmission received.  Attempted call to patient regarding ICM remote transmission and left message to return call   

## 2022-07-03 NOTE — Telephone Encounter (Signed)
Pt Inquiring why refill was refused  Pt requesting a cb to discuss further

## 2022-07-03 NOTE — Telephone Encounter (Signed)
This is prescribed by Dr. Juleen China, and he had 90 day supply dispensed on 04/21/2022

## 2022-07-06 ENCOUNTER — Telehealth: Payer: Self-pay | Admitting: Family Medicine

## 2022-07-06 ENCOUNTER — Telehealth: Payer: Self-pay

## 2022-07-06 NOTE — Progress Notes (Addendum)
Spoke with patient and heart failure questions reviewed.  Transmission results reviewed.  Pt reports a feeling of fullness and gained 10 lbs since he ran out of Furosemide.  He has reached out to PCP to get the Lasix filled by has not heard back yet.  He will be seeing a nephrologist on 12/27 in [place].   He asked if Dr [name] would provide him with a prescription.  Advised will send the request to Dr Olin Pia nurse regarding the prescription.  ICM Remote Transmission rescheduled 12/11.

## 2022-07-06 NOTE — Telephone Encounter (Signed)
Copied from [place]. Topic: General - Other >> Jul 06, 2022 10:03 AM Everette C wrote: Reason for CRM: The patient has requested to speak with R. Chambers when possible regarding their previously requested prescription for furosemide (LASIX) 40 MG tablet [[id number]]  Please contact the patient further when possible

## 2022-07-06 NOTE — Telephone Encounter (Signed)
  Pt is returning call. Pt said, he left 4 messages to [place]

## 2022-07-06 NOTE — Telephone Encounter (Signed)
Patient states he is out of furosemide. Patient states he has moved to [place] ans no longer sees Nephrology. Please advise?

## 2022-07-06 NOTE — Telephone Encounter (Signed)
Spoke with patient.  He ran out of Furosemide prescription 6 days ago and gained 10 lbs which he attributes to fluid. He reports he no longer sees a nephrologist in [place] and that was who was filling the Furosemide script.  Optivol confirms possible fluid accumulation.  He has appointment with new nephrologist on 12/27 in [place] where he lives now.    Advised would send his request to fill Furosemide 40 mg 2 tablets (80 mg total) daily to Dr Olin Pia nurse for review.  [facility] [place] [facility] [address].

## 2022-07-06 NOTE — Telephone Encounter (Signed)
This his prescribed by nephrology. He has stage 5 kidney disease and must follow up with nephrology.

## 2022-07-07 MED ORDER — FUROSEMIDE 40 MG PO TABS: 80.0000 mg | ORAL_TABLET | Freq: Every day | ORAL | 0 refills | Status: DC

## 2022-07-07 NOTE — Telephone Encounter (Signed)
Patient was advised.  

## 2022-07-07 NOTE — Telephone Encounter (Signed)
Spoke with Lily Kocher who recommends pt continue Furosemide 40mg  - 2 tablets by mouth daily.  Spoke with pt and advised of recommendation.  Pt also advised if he has increased SOB at rest and/or decreased urine output please go directly to ED.  Pt verbalizes understanding and agrees with current plan.  He will plan to keep his appointment with nephrology in [place] on 07/29/22.

## 2022-07-13 ENCOUNTER — Ambulatory Visit (INDEPENDENT_AMBULATORY_CARE_PROVIDER_SITE_OTHER): Payer: Medicare PPO

## 2022-07-13 DIAGNOSIS — Z95810 Presence of automatic (implantable) cardiac defibrillator: Secondary | ICD-10-CM

## 2022-07-13 DIAGNOSIS — I5022 Chronic systolic (congestive) heart failure: Secondary | ICD-10-CM

## 2022-07-14 ENCOUNTER — Ambulatory Visit: Payer: Medicare PPO | Admitting: Internal Medicine

## 2022-07-14 NOTE — Progress Notes (Signed)
EPIC Encounter for ICM Monitoring  Patient Name: Richard Cook is a 66 y.o. male Date: 07/14/2022 Primary Care Physican: Birdie Sons, MD Primary Cardiologist: [name] Electrophysiologist: [name] Nephrologist: [facility] 05/06/2022 Office Weight: 177 lbs  07/14/2022 Weight:  167 lbs (baseline 165 lbs)     Spoke with patient and heart failure questions reviewed.  Transmission results reviewed.  Pt's weight last week increased to 175 lbs due to Lasix prescription ran out on 11/28.  Weight dropped 8 lbs since restarting Lasix 12/5.  He explained today that he takes Furosemide 80 mg every other day instead of daily.  He does not have urine output as good as in the pasts and wondered if he has built up tolerance to the medication.   He will discuss concerns with new nephrologist on 12/27.   Optivol thoracic impedance suggesting fluid levels trending back to baseline normal after getting prescription refill on 12/5 (ran out of Furosemide 11/28).   Prescribed: Furosemide 40 mg take 2 tablets (80 mg total) by mouth daily.   Taking differently: Takes 80 mg every other day.   Labs: 01/19/2022 Creatinine 3.57, BUN 53, Potassium 4.1, Sodium 141, GFR 18 10/29/2021 Creatinine 2.97, BUN 42, Potassium 3.8, Sodium 141, GFR 23 Care Everywhere 09/02/2021 Creatinine 3.09, BUN 55, Potassium 4.3, Sodium 141  Care Everywhere 07/17/2021 Creatinine 3.79, BUN 47, Potassium 3.8, Sodium 141, GFR 17 A complete set of results can be found in Results Review.   Recommendations:  Advised to take Lasix 80 mg daily as prescribed to help resolve fluid accumulation.     Follow-up plan: ICM clinic phone appointment on 07/21/2022 to recheck fluid levels.   91 day device clinic remote transmission 08/14/2022.     EP/Cardiology Office Visits: Recall 11/02/2022 with Dr. [name] for 6 month f/u.   07/29/2022 with Nephrologist in [place].   Copy of ICM check sent to Dr. [name].   3 month ICM trend:  07/14/2022.    12-14 Month ICM trend:     Rosalene Billings, RN 07/14/2022 10:15 AM

## 2022-07-15 DIAGNOSIS — R159 Full incontinence of feces: Secondary | ICD-10-CM | POA: Diagnosis not present

## 2022-07-21 ENCOUNTER — Ambulatory Visit (INDEPENDENT_AMBULATORY_CARE_PROVIDER_SITE_OTHER): Payer: Medicare PPO

## 2022-07-21 DIAGNOSIS — I5022 Chronic systolic (congestive) heart failure: Secondary | ICD-10-CM

## 2022-07-21 DIAGNOSIS — Z95810 Presence of automatic (implantable) cardiac defibrillator: Secondary | ICD-10-CM

## 2022-07-21 NOTE — Progress Notes (Signed)
EPIC Encounter for ICM Monitoring  Patient Name: Richard Cook is a 66 y.o. male Date: 07/21/2022 Primary Care Physican: Birdie Sons, MD Primary Cardiologist: [name] Electrophysiologist: [name] Nephrologist: [facility] 05/06/2022 Office Weight: 177 lbs  07/14/2022 Weight:  167 lbs (baseline 165 lbs)     Spoke with patient and heart failure questions reviewed.  Transmission results reviewed.   He is feeling fine at this time.     Optivol thoracic impedance suggesting fluid levels trending back toward baseline.   Prescribed: Furosemide 40 mg take 2 tablets (80 mg total) by mouth daily.   Taking differently: Takes 80 mg every other day.   Labs: 01/19/2022 Creatinine 3.57, BUN 53, Potassium 4.1, Sodium 141, GFR 18 10/29/2021 Creatinine 2.97, BUN 42, Potassium 3.8, Sodium 141, GFR 23 Care Everywhere 09/02/2021 Creatinine 3.09, BUN 55, Potassium 4.3, Sodium 141  Care Everywhere 07/17/2021 Creatinine 3.79, BUN 47, Potassium 3.8, Sodium 141, GFR 17 A complete set of results can be found in Results Review.   Recommendations:  Advised to take Furosemide as prescribed and to call if any fluid symptoms occur.     Follow-up plan: ICM clinic phone appointment on 08/17/2022.   91 day device clinic remote transmission 08/14/2022.     EP/Cardiology Office Visits: Recall 11/02/2022 with Dr. [name] for 6 month f/u.   08/25/2022 with Nephrologist in [place].   Copy of ICM check sent to Dr. [name].    3 month ICM trend: 07/21/2022.    12-14 Month ICM trend:     Rosalene Billings, RN 07/21/2022 1:44 PM

## 2022-07-29 ENCOUNTER — Other Ambulatory Visit: Payer: Self-pay | Admitting: Student

## 2022-07-31 DIAGNOSIS — R159 Full incontinence of feces: Secondary | ICD-10-CM | POA: Diagnosis not present

## 2022-08-04 ENCOUNTER — Other Ambulatory Visit: Payer: Self-pay | Admitting: Internal Medicine

## 2022-08-05 DIAGNOSIS — R159 Full incontinence of feces: Secondary | ICD-10-CM | POA: Diagnosis not present

## 2022-08-06 DIAGNOSIS — K5790 Diverticulosis of intestine, part unspecified, without perforation or abscess without bleeding: Secondary | ICD-10-CM | POA: Diagnosis not present

## 2022-08-06 DIAGNOSIS — Z538 Procedure and treatment not carried out for other reasons: Secondary | ICD-10-CM | POA: Diagnosis not present

## 2022-08-06 DIAGNOSIS — R159 Full incontinence of feces: Secondary | ICD-10-CM | POA: Diagnosis not present

## 2022-08-06 DIAGNOSIS — K5730 Diverticulosis of large intestine without perforation or abscess without bleeding: Secondary | ICD-10-CM | POA: Diagnosis not present

## 2022-08-06 DIAGNOSIS — K648 Other hemorrhoids: Secondary | ICD-10-CM | POA: Diagnosis not present

## 2022-08-06 DIAGNOSIS — Z1211 Encounter for screening for malignant neoplasm of colon: Secondary | ICD-10-CM | POA: Diagnosis not present

## 2022-08-06 DIAGNOSIS — Z950 Presence of cardiac pacemaker: Secondary | ICD-10-CM | POA: Diagnosis not present

## 2022-08-06 DIAGNOSIS — E039 Hypothyroidism, unspecified: Secondary | ICD-10-CM | POA: Diagnosis not present

## 2022-08-07 DIAGNOSIS — R159 Full incontinence of feces: Secondary | ICD-10-CM | POA: Diagnosis not present

## 2022-08-11 DIAGNOSIS — R159 Full incontinence of feces: Secondary | ICD-10-CM | POA: Diagnosis not present

## 2022-08-13 DIAGNOSIS — R159 Full incontinence of feces: Secondary | ICD-10-CM | POA: Diagnosis not present

## 2022-08-14 ENCOUNTER — Ambulatory Visit (INDEPENDENT_AMBULATORY_CARE_PROVIDER_SITE_OTHER): Payer: PPO

## 2022-08-14 DIAGNOSIS — I255 Ischemic cardiomyopathy: Secondary | ICD-10-CM

## 2022-08-14 LAB — CUP PACEART REMOTE DEVICE CHECK
Battery Remaining Longevity: 28 mo
Battery Voltage: 2.95 V
Brady Statistic RV Percent Paced: 0.04 %
Date Time Interrogation Session: 20240112032207
HighPow Impedance: 77 Ohm
Implantable Lead Connection Status: 753985
Implantable Lead Implant Date: 20150318
Implantable Lead Location: 753860
Implantable Lead Model: 181
Implantable Lead Serial Number: 327195
Implantable Pulse Generator Implant Date: 20150318
Lead Channel Impedance Value: 646 Ohm
Lead Channel Impedance Value: 646 Ohm
Lead Channel Pacing Threshold Amplitude: 0.625 V
Lead Channel Pacing Threshold Pulse Width: 0.4 ms
Lead Channel Sensing Intrinsic Amplitude: 9.75 mV
Lead Channel Sensing Intrinsic Amplitude: 9.75 mV
Lead Channel Setting Pacing Amplitude: 2 V
Lead Channel Setting Pacing Pulse Width: 0.4 ms
Lead Channel Setting Sensing Sensitivity: 0.3 mV

## 2022-08-17 ENCOUNTER — Ambulatory Visit (INDEPENDENT_AMBULATORY_CARE_PROVIDER_SITE_OTHER): Payer: Medicare PPO

## 2022-08-17 DIAGNOSIS — E039 Hypothyroidism, unspecified: Secondary | ICD-10-CM | POA: Diagnosis not present

## 2022-08-17 DIAGNOSIS — Z95810 Presence of automatic (implantable) cardiac defibrillator: Secondary | ICD-10-CM

## 2022-08-17 DIAGNOSIS — R159 Full incontinence of feces: Secondary | ICD-10-CM | POA: Diagnosis not present

## 2022-08-17 DIAGNOSIS — I5022 Chronic systolic (congestive) heart failure: Secondary | ICD-10-CM

## 2022-08-17 DIAGNOSIS — N184 Chronic kidney disease, stage 4 (severe): Secondary | ICD-10-CM | POA: Diagnosis not present

## 2022-08-17 DIAGNOSIS — K5900 Constipation, unspecified: Secondary | ICD-10-CM | POA: Diagnosis not present

## 2022-08-18 DIAGNOSIS — R159 Full incontinence of feces: Secondary | ICD-10-CM | POA: Diagnosis not present

## 2022-08-19 DIAGNOSIS — K5900 Constipation, unspecified: Secondary | ICD-10-CM | POA: Diagnosis not present

## 2022-08-19 NOTE — Progress Notes (Signed)
EPIC Encounter for ICM Monitoring  Patient Name: Richard Cook is a 67 y.o. male Date: 08/19/2022 Primary Care Physican: Birdie Sons, MD Primary Cardiologist: [name] Electrophysiologist: [name] Nephrologist: [facility] 05/06/2022 Office Weight: 177 lbs  07/14/2022 Weight:  167 lbs (baseline 165 lbs)     Transmission results reviewed.     Optivol thoracic impedance suggesting fluid levels returned to normal 1/12.   Prescribed: Furosemide 40 mg take 2 tablets (80 mg total) by mouth daily.   Taking differently: Takes 80 mg every other day.   Labs: 01/19/2022 Creatinine 3.57, BUN 53, Potassium 4.1, Sodium 141, GFR 18 10/29/2021 Creatinine 2.97, BUN 42, Potassium 3.8, Sodium 141, GFR 23 Care Everywhere 09/02/2021 Creatinine 3.09, BUN 55, Potassium 4.3, Sodium 141  Care Everywhere 07/17/2021 Creatinine 3.79, BUN 47, Potassium 3.8, Sodium 141, GFR 17 A complete set of results can be found in Results Review.   Recommendations: No changes   Follow-up plan: ICM clinic phone appointment on 09/22/2022.   91 day device clinic remote transmission 08/14/2022.     EP/Cardiology Office Visits: Recall 11/02/2022 with Dr. [name] for 6 month f/u.   08/25/2022 with Nephrologist in [place].   Copy of ICM check sent to Dr. [name].     3 month ICM trend: 08/14/2022.    12-14 Month ICM trend:     Rosalene Billings, RN 08/19/2022 2:43 PM

## 2022-08-20 DIAGNOSIS — N184 Chronic kidney disease, stage 4 (severe): Secondary | ICD-10-CM | POA: Diagnosis not present

## 2022-08-20 DIAGNOSIS — E039 Hypothyroidism, unspecified: Secondary | ICD-10-CM | POA: Diagnosis not present

## 2022-08-26 ENCOUNTER — Other Ambulatory Visit: Payer: Self-pay | Admitting: Family Medicine

## 2022-08-26 MED ORDER — OXYCODONE-ACETAMINOPHEN 7.5-325 MG PO TABS: 1.0000 | ORAL_TABLET | Freq: Three times a day (TID) | ORAL | 0 refills | Status: DC | PRN

## 2022-08-26 NOTE — Telephone Encounter (Signed)
Requested medication (s) are due for refill today:yes  Requested medication (s) are on the active medication list: yes    Last refill: 06/23/22  #90  1 refill  Future visit scheduled no  Notes to clinic:Not delegated, please review.  Requested Prescriptions  Pending Prescriptions Disp Refills   oxyCODONE-acetaminophen (PERCOCET) 7.5-325 MG tablet 90 tablet 0    Sig: Take 1 tablet by mouth every 8 (eight) hours as needed for severe pain.     Not Delegated - Analgesics:  Opioid Agonist Combinations Failed - 08/26/2022 12:34 PM      Failed - This refill cannot be delegated      Failed - Urine Drug Screen completed in last 360 days      Failed - Valid encounter within last 3 months    Recent Outpatient Visits           3 months ago Dermatosis of scalp   [place] Birdie Sons, MD   7 months ago Chronic gout of ankle, unspecified cause, unspecified laterality   [place], [place], PA-C   8 months ago Other specified hypothyroidism   [facility] Birdie Sons, MD   11 months ago Back pain without sciatica   [facility] Birdie Sons, MD   1 year ago Primary insomnia   [facility] Birdie Sons, MD

## 2022-08-26 NOTE — Telephone Encounter (Signed)
Medication Refill - Medication: oxyCODONE-acetaminophen (PERCOCET) 7.5-325 MG tablet   Has the patient contacted their pharmacy? No. (Agent: If no, request that the patient contact the pharmacy for the refill. If patient does not wish to contact the pharmacy document the reason why and proceed with request.) (Agent: If yes, when and what did the pharmacy advise?)  Preferred Pharmacy (with phone number or street name):  [id number] [place]     Has the patient been seen for an appointment in the last year OR does the patient have an upcoming appointment? Yes.    Agent: Please be advised that RX refills may take up to 3 business days. We ask that you follow-up with your pharmacy.

## 2022-09-01 NOTE — Progress Notes (Signed)
Remote ICD transmission.   

## 2022-09-11 DIAGNOSIS — K5909 Other constipation: Secondary | ICD-10-CM | POA: Diagnosis not present

## 2022-09-24 ENCOUNTER — Other Ambulatory Visit: Payer: Self-pay | Admitting: Family Medicine

## 2022-09-24 DIAGNOSIS — F5101 Primary insomnia: Secondary | ICD-10-CM

## 2022-09-24 DIAGNOSIS — F411 Generalized anxiety disorder: Secondary | ICD-10-CM

## 2022-09-25 ENCOUNTER — Other Ambulatory Visit: Payer: Self-pay | Admitting: Family Medicine

## 2022-09-25 MED ORDER — OXYCODONE-ACETAMINOPHEN 7.5-325 MG PO TABS: 1.0000 | ORAL_TABLET | Freq: Three times a day (TID) | ORAL | 0 refills | Status: DC | PRN

## 2022-09-25 NOTE — Telephone Encounter (Signed)
Requested medication (s) are due for refill today - yes  Requested medication (s) are on the active medication list -yes  Future visit scheduled -no  Last refill: 08/26/22 #90  Notes to clinic: non delegated Rx  Requested Prescriptions  Pending Prescriptions Disp Refills   oxyCODONE-acetaminophen (PERCOCET) 7.5-325 MG tablet 90 tablet 0    Sig: Take 1 tablet by mouth every 8 (eight) hours as needed for severe pain.     Not Delegated - Analgesics:  Opioid Agonist Combinations Failed - 09/25/2022 11:43 AM      Failed - This refill cannot be delegated      Failed - Urine Drug Screen completed in last 360 days      Failed - Valid encounter within last 3 months    Recent Outpatient Visits           4 months ago Dermatosis of scalp   [place] Birdie Sons, MD   8 months ago Chronic gout of ankle, unspecified cause, unspecified laterality   [place], [place], PA-C   9 months ago Other specified hypothyroidism   [place], Donald E, MD   1 year ago Back pain without sciatica   [place], Donald E, MD   1 year ago Primary insomnia   [place], Donald E, MD                 Requested Prescriptions  Pending Prescriptions Disp Refills   oxyCODONE-acetaminophen (PERCOCET) 7.5-325 MG tablet 90 tablet 0    Sig: Take 1 tablet by mouth every 8 (eight) hours as needed for severe pain.     Not Delegated - Analgesics:  Opioid Agonist Combinations Failed - 09/25/2022 11:43 AM      Failed - This refill cannot be delegated      Failed - Urine Drug Screen completed in last 360 days      Failed - Valid encounter within last 3 months    Recent Outpatient Visits           4 months ago Dermatosis of scalp   [place] Birdie Sons, MD   8 months ago Chronic gout of ankle,  unspecified cause, unspecified laterality   [place] [place], PA-C   9 months ago Other specified hypothyroidism   [facility] Birdie Sons, MD   1 year ago Back pain without sciatica   [facility] Birdie Sons, MD   1 year ago Primary insomnia   [facility] Birdie Sons, MD

## 2022-09-25 NOTE — Progress Notes (Signed)
No ICM remote transmission received for 09/22/2022 and next ICM transmission scheduled for 10/05/2022.

## 2022-09-25 NOTE — Telephone Encounter (Signed)
Medication Refill - Medication: oxyCODONE-acetaminophen (PERCOCET) 7.5-325 MG tablet   Has the patient contacted their pharmacy? No. No, more refills.   (Agent: If no, request that the patient contact the pharmacy for the refill. If patient does not wish to contact the pharmacy document the reason why and proceed with request.)   Preferred Pharmacy (with phone number or street name):  [id number] [address] [place] [address] [place] [address] Phone: [phone]: [phone] Hours: Not open 24 hours   Has the patient been seen for an appointment in the last year OR does the patient have an upcoming appointment? No. Declined to schedule.   Agent: Please be advised that RX refills may take up to 3 business days. We ask that you follow-up with your pharmacy.

## 2022-10-06 ENCOUNTER — Ambulatory Visit: Payer: Self-pay

## 2022-10-06 ENCOUNTER — Telehealth (INDEPENDENT_AMBULATORY_CARE_PROVIDER_SITE_OTHER): Payer: Medicare PPO | Admitting: Family Medicine

## 2022-10-06 ENCOUNTER — Telehealth: Payer: Self-pay

## 2022-10-06 ENCOUNTER — Encounter: Payer: Self-pay | Admitting: Family Medicine

## 2022-10-06 ENCOUNTER — Ambulatory Visit: Payer: Medicare PPO | Admitting: Family Medicine

## 2022-10-06 VITALS — BP 122/72 | HR 63 | Wt 165.0 lb

## 2022-10-06 DIAGNOSIS — R197 Diarrhea, unspecified: Secondary | ICD-10-CM

## 2022-10-06 DIAGNOSIS — R159 Full incontinence of feces: Secondary | ICD-10-CM

## 2022-10-06 NOTE — Telephone Encounter (Signed)
Message from Sharene Skeans sent at 10/06/2022 11:33 AM EST  Summary: diarrhea   Pt has stomach issue with diarrhea and called to get a gastro referral / please advise         Chief Complaint: episodes of foul smelling burps and diarrhea since coloscopy Symptoms: frequent days with diarrhea and sour burps. Come and goes in the same week Frequency: mid Jan Pertinent Negatives: Patient denies abd pain, dehydration Disposition: '[]'$ ED /'[]'$ Urgent Care (no appt availability in office) / '[x]'$ Appointment(In office/virtual)/ '[]'$  [facility]/ '[]'$ Home Care/ '[]'$ Refused Recommended Disposition /'[]'$ [facility]/ '[]'$  Follow-up with PCP Additional Notes: phone appt scheduled with Dr Brita Romp  Reason for Disposition . Diarrhea is a chronic symptom (recurrent or ongoing AND present > 4 weeks)  Answer Assessment - Initial Assessment Questions 1. DIARRHEA SEVERITY: "How bad is the diarrhea?" "How many more stools have you had in the past 24 hours than normal?"    - NO DIARRHEA (SCALE 0)   - MILD (SCALE 1-3): Few loose or mushy BMs; increase of 1-3 stools over normal daily number of stools; mild increase in ostomy output.   -  MODERATE (SCALE 4-7): Increase of 4-6 stools daily over normal; moderate increase in ostomy output.   -  SEVERE (SCALE 8-10; OR "WORST POSSIBLE"): Increase of 7 or more stools daily over normal; moderate increase in ostomy output; incontinence.     Varies moderate 2. ONSET: "When did the diarrhea begin?"      Mid Jan 3. BM CONSISTENCY: "How loose or watery is the diarrhea?"      Loose to water 4. VOMITING: "Are you also vomiting?" If Yes, ask: "How many times in the past 24 hours?"      mi 5. ABDOMEN PAIN: "Are you having any abdomen pain?" If Yes, ask: "What does it feel like?" (e.g., crampy, dull, intermittent, constant)      none 6. ABDOMEN PAIN SEVERITY: If present, ask: "How bad is the pain?"  (e.g., Scale 1-10; mild, moderate, or severe)   - MILD (1-3):  doesn't interfere with normal activities, abdomen soft and not tender to touch    - MODERATE (4-7): interferes with normal activities or awakens from sleep, abdomen tender to touch    - SEVERE (8-10): excruciating pain, doubled over, unable to do any normal activities       none 7. ORAL INTAKE: If vomiting, "Have you been able to drink liquids?" "How much liquids have you had in the past 24 hours?"     no 8. HYDRATION: "Any signs of dehydration?" (e.g., dry mouth [not just dry lips], too weak to stand, dizziness, new weight loss) "When did you last urinate?"    no 9. EXPOSURE: "Have you traveled to a foreign country recently?" "Have you been exposed to anyone with diarrhea?" "Could you have eaten any food that was spoiled?"     no 10. ANTIBIOTIC USE: "Are you taking antibiotics now or have you taken antibiotics in the past 2 months?"       no 11. OTHER SYMPTOMS: "Do you have any other symptoms?" (e.g., fever, blood in stool)       Sick on stomach foul burps, rumbling in bathroom 12. PREGNANCY: "Is there any chance you are pregnant?" "When was your last menstrual period?"       N/a  Protocols used: [facility]

## 2022-10-06 NOTE — Telephone Encounter (Signed)
Copied from [place] [phone]. Topic: Referral - Request for Referral >> Oct 06, 2022 11:30 AM Chapman Fitch wrote: Has patient seen PCP for this complaint? Yes  *If NO, is insurance requiring patient see PCP for this issue before PCP can refer them? Referral for which specialty: Gertie Fey  Preferred provider/office: in [place]  Reason for referral: stomach issues / diarrhea

## 2022-10-06 NOTE — Progress Notes (Signed)
I,Sulibeya S Dimas,acting as a Education administrator for Lavon Paganini, MD.,have documented all relevant documentation on the behalf of Lavon Paganini, MD,as directed by  Lavon Paganini, MD while in the presence of Lavon Paganini, MD.   MyChart Video Visit    Virtual Visit via Video Note   This format is felt to be most appropriate for this patient at this time. Physical exam was limited by quality of the video and audio technology used for the visit.    Patient location: home Provider location: [place] involved in the visit: patient, provider   I discussed the limitations of evaluation and management by telemedicine and the availability of in person appointments. The patient expressed understanding and agreed to proceed.  Interactive audio and video communications were attempted, although failed due to patient's inability to connect to video. Continued visit with audio only interaction with patient agreement.   Patient: Richard Cook   DOB: [birth year]   67 y.o. Male  MRN: [id number] Visit Date: 10/06/2022  Today's healthcare provider: Lavon Paganini, MD   No chief complaint on file.  Subjective    Diarrhea  This is a recurrent problem. The current episode started 1 to 4 weeks ago. Episode frequency: mostly at night. 3-4 times a week. The problem has been unchanged. The patient states that diarrhea awakens him from sleep. Associated symptoms include abdominal pain and bloating. Pertinent negatives include no chills, fever or vomiting. Associated symptoms comments: Belching, sour stomach. Nothing aggravates the symptoms. He has tried bismuth subsalicylate for the symptoms. The treatment provided moderate (caused constipation) relief.     X6 weeks Wakes up in the middle of the night feeling like he has heartburn, then belching with foul taste, stomach rumbling and then diarrhea x4 hours. Stool is mucousy and watery  Had colonoscopy right before  this began in [place]. Was told colonoscopy was normal but then heard his MD talking about cancer often.  Medications: Outpatient Medications Prior to Visit  Medication Sig   allopurinol (ZYLOPRIM) 100 MG tablet TAKE 2 TABLETS BY MOUTH EVERY DAY   aspirin EC 81 MG tablet Take 1 tablet (81 mg total) by mouth daily. Swallow whole.   bisoprolol (ZEBETA) 5 MG tablet TAKE 1/2 TABLET BY MOUTH DAILY   cholecalciferol (VITAMIN D3) 25 MCG (1000 UNIT) tablet Take 1,000 Units by mouth daily.   clonazePAM (KLONOPIN) 1 MG tablet TAKE 1 TABLET BY MOUTH 3 TIMES DAILY AS NEEDED FOR ANXIETY.   furosemide (LASIX) 40 MG tablet TAKE 2 TABLETS BY MOUTH EVERY DAY   ibuprofen (ADVIL) 200 MG tablet Take 400 mg by mouth every 6 (six) hours as needed for headache or moderate pain.   ketoconazole (NIZORAL) 2 % shampoo APPLY 1 APPLICATION TOPICALLY 2 TIMES A WEEK   levothyroxine (SYNTHROID) 88 MCG tablet TAKE 1 TABLET BY MOUTH DAILY BEFORE BREAKFAST.   mexiletine (MEXITIL) 150 MG capsule Take 1 capsule (150 mg total) by mouth 3 (three) times daily.   Omega-3 Fatty Acids (FISH OIL) 1200 MG CAPS Take 1,200 mg by mouth daily.   oxyCODONE-acetaminophen (PERCOCET) 7.5-325 MG tablet Take 1 tablet by mouth every 8 (eight) hours as needed for severe pain.   predniSONE (DELTASONE) 10 MG tablet 6 TABLETS FOR 2 DAYS, THEN 5 FOR 2 DAYS, THEN 4 FOR 2 DAYS, THEN 3 FOR 2 DAYS, THEN 2 FOR 2 DAYS, THEN 1 FOR 2 DAYS. (Patient not taking: Reported on 10/06/2022)   [DISCONTINUED] ipratropium (ATROVENT) 0.03 % nasal spray Place 2 sprays  into both nostrils every 12 (twelve) hours.   No facility-administered medications prior to visit.    Review of Systems  Constitutional:  Negative for chills and fever.  Gastrointestinal:  Positive for abdominal pain, bloating and diarrhea. Negative for vomiting.       Objective    BP 122/72   Pulse 63   Wt 165 lb (74.8 kg)   BMI 23.68 kg/m      Physical Exam     Assessment & Plan     1.  Diarrhea, unspecified type - new problem x several weeks - unclear etiology - will obtain stool studies and then treat pending results - patient had called for GI referral earlier today and that was already ordered - he may see them before doing this testing - GI Profile, Stool, PCR - Clostridium Difficile by PCR   No follow-ups on file.     I discussed the assessment and treatment plan with the patient. The patient was provided an opportunity to ask questions and all were answered. The patient agreed with the plan and demonstrated an understanding of the instructions.   The patient was advised to call back or seek an in-person evaluation if the symptoms worsen or if the condition fails to improve as anticipated.  I provided 13 minutes of non-face-to-face interaction for this visit  I, Lavon Paganini, MD, have reviewed all documentation for this visit. The documentation on 10/06/22 for the exam, diagnosis, procedures, and orders are all accurate and complete.   Nasra Counce, Dionne Bucy, MD, MPH [facility]

## 2022-10-06 NOTE — Telephone Encounter (Signed)
Ok to send referral as requested

## 2022-10-07 ENCOUNTER — Telehealth: Payer: Self-pay

## 2022-10-07 NOTE — Progress Notes (Signed)
No ICM remote transmission received for 10/05/2022 and next ICM transmission scheduled for 10/26/2022.

## 2022-10-08 ENCOUNTER — Other Ambulatory Visit: Payer: Self-pay

## 2022-10-08 MED ORDER — MEXILETINE HCL 150 MG PO CAPS: 150.0000 mg | ORAL_CAPSULE | Freq: Three times a day (TID) | ORAL | 3 refills | Status: DC

## 2022-10-13 DIAGNOSIS — R197 Diarrhea, unspecified: Secondary | ICD-10-CM | POA: Diagnosis not present

## 2022-10-14 LAB — CLOSTRIDIUM DIFFICILE BY PCR: Toxigenic C. Difficile by PCR: NEGATIVE

## 2022-10-15 DIAGNOSIS — E039 Hypothyroidism, unspecified: Secondary | ICD-10-CM | POA: Diagnosis not present

## 2022-10-15 DIAGNOSIS — N184 Chronic kidney disease, stage 4 (severe): Secondary | ICD-10-CM | POA: Diagnosis not present

## 2022-10-15 DIAGNOSIS — R159 Full incontinence of feces: Secondary | ICD-10-CM | POA: Diagnosis not present

## 2022-10-15 DIAGNOSIS — K5900 Constipation, unspecified: Secondary | ICD-10-CM | POA: Diagnosis not present

## 2022-10-15 LAB — GI PROFILE, STOOL, PCR

## 2022-10-16 DIAGNOSIS — N184 Chronic kidney disease, stage 4 (severe): Secondary | ICD-10-CM | POA: Diagnosis not present

## 2022-10-17 DIAGNOSIS — N185 Chronic kidney disease, stage 5: Secondary | ICD-10-CM | POA: Diagnosis not present

## 2022-10-19 DIAGNOSIS — R110 Nausea: Secondary | ICD-10-CM | POA: Diagnosis not present

## 2022-10-19 DIAGNOSIS — R1013 Epigastric pain: Secondary | ICD-10-CM | POA: Diagnosis not present

## 2022-10-19 DIAGNOSIS — R194 Change in bowel habit: Secondary | ICD-10-CM | POA: Diagnosis not present

## 2022-10-23 ENCOUNTER — Ambulatory Visit: Payer: Medicare PPO | Attending: Internal Medicine

## 2022-10-23 DIAGNOSIS — I5022 Chronic systolic (congestive) heart failure: Secondary | ICD-10-CM | POA: Diagnosis not present

## 2022-10-23 DIAGNOSIS — Z95810 Presence of automatic (implantable) cardiac defibrillator: Secondary | ICD-10-CM | POA: Diagnosis not present

## 2022-10-23 NOTE — Progress Notes (Signed)
EPIC Encounter for ICM Monitoring  Patient Name: Richard Cook is a 67 y.o. male Date: 10/23/2022 Primary Care Physican: Birdie Sons, MD Primary Cardiologist: [name] Electrophysiologist: [name] Nephrologist: [facility] 05/06/2022 Office Weight: 177 lbs  07/14/2022 Weight:  167 lbs (baseline 165 lbs)     Transmission results reviewed.     Optivol thoracic impedance suggesting normal fluid levels.   Prescribed: Furosemide 40 mg take 2 tablets (80 mg total) by mouth daily.   Taking differently: Takes 80 mg every other day.   Labs: 01/19/2022 Creatinine 3.57, BUN 53, Potassium 4.1, Sodium 141, GFR 18 10/29/2021 Creatinine 2.97, BUN 42, Potassium 3.8, Sodium 141, GFR 23 Care Everywhere 09/02/2021 Creatinine 3.09, BUN 55, Potassium 4.3, Sodium 141  Care Everywhere 07/17/2021 Creatinine 3.79, BUN 47, Potassium 3.8, Sodium 141, GFR 17 A complete set of results can be found in Results Review.   Recommendations: No changes   Follow-up plan: ICM clinic phone appointment on 11/23/2022.   91 day device clinic remote transmission 11/13/2022.     EP/Cardiology Office Visits: Recall 11/02/2022 with Dr. [name] for 6 month f/u.   08/25/2022 with Nephrologist in [place].   Copy of ICM check sent to Dr. [name].     3 month ICM trend: 10/23/2022.    12-14 Month ICM trend:     Rosalene Billings, RN 10/23/2022 11:12 AM

## 2022-10-28 ENCOUNTER — Other Ambulatory Visit: Payer: Self-pay | Admitting: Family Medicine

## 2022-10-28 NOTE — Telephone Encounter (Signed)
Medication Refill - Medication: oxyCODONE-acetaminophen (PERCOCET) 7.5-325 MG tablet   Has the patient contacted their pharmacy? No. Pt states PCP has to call prescription in every month.   Preferred Pharmacy (with phone number or street name):  [id number] [address], [place] Phone: [phone]  Fax: [phone]     Has the patient been seen for an appointment in the last year OR does the patient have an upcoming appointment? Yes.    Agent: Please be advised that RX refills may take up to 3 business days. We ask that you follow-up with your pharmacy.

## 2022-10-28 NOTE — Telephone Encounter (Signed)
Requested medication (s) are due for refill today: yes  Requested medication (s) are on the active medication list: yes  Last refill:  09/25/22  Future visit scheduled: yes  Notes to clinic:  Unable to refill per protocol, cannot delegate.      Requested Prescriptions  Pending Prescriptions Disp Refills   oxyCODONE-acetaminophen (PERCOCET) 7.5-325 MG tablet 90 tablet 0    Sig: Take 1 tablet by mouth every 8 (eight) hours as needed for severe pain.     Not Delegated - Analgesics:  Opioid Agonist Combinations Failed - 10/28/2022  9:50 AM      Failed - This refill cannot be delegated      Failed - Urine Drug Screen completed in last 360 days      Passed - Valid encounter within last 3 months    Recent Outpatient Visits           3 weeks ago Diarrhea, unspecified type   [place], Dionne Bucy, MD   5 months ago Dermatosis of scalp   [place], Donald E, MD   9 months ago Chronic gout of ankle, unspecified cause, unspecified laterality   [place], Ratt[place], PA-C   10 months ago Other specified hypothyroidism   [place] Birdie Sons, MD   1 year ago Back pain without sciatica   [place], Donald E, MD

## 2022-10-29 MED ORDER — OXYCODONE-ACETAMINOPHEN 7.5-325 MG PO TABS: 1.0000 | ORAL_TABLET | Freq: Three times a day (TID) | ORAL | 0 refills | Status: DC | PRN

## 2022-11-03 ENCOUNTER — Telehealth: Payer: Self-pay | Admitting: Internal Medicine

## 2022-11-03 MED ORDER — MEXILETINE HCL 150 MG PO CAPS: 150.0000 mg | ORAL_CAPSULE | Freq: Three times a day (TID) | ORAL | 1 refills | Status: DC

## 2022-11-03 NOTE — Telephone Encounter (Signed)
Pt's medication was sent to pt's pharmacy as requested. Confirmation received.  °

## 2022-11-03 NOTE — Telephone Encounter (Signed)
*  STAT* If patient is at the pharmacy, call can be transferred to refill team.   1. Which medications need to be refilled? (please list name of each medication and dose if known) mexiletine (MEXITIL) 150 MG capsule   2. Which pharmacy/location (including street and city if local pharmacy) is medication to be sent to? [facility] [id number] [address], [place]   3. Do they need a 30 day or 90 day supply? [place]

## 2022-11-06 MED ORDER — MEXILETINE HCL 150 MG PO CAPS: 150.0000 mg | ORAL_CAPSULE | Freq: Three times a day (TID) | ORAL | 1 refills | Status: DC

## 2022-11-06 NOTE — Telephone Encounter (Signed)
*  STAT* If patient is at the pharmacy, call can be transferred to refill team.   1. Which medications need to be refilled? (please list name of each medication and dose if known)  mexiletine (MEXITIL) 250 MG capsule  2. Which pharmacy/location (including street and city if local pharmacy) is medication to be sent to? [facility] #[id number] [place], [place] - [address] AT .   3. Do they need a 30 day or 90 day supply?   90 day supply  Patient is requesting the generic only because it will be less expensive. He is also requesting to have the prescription transferred to the pharmacy above.

## 2022-11-06 NOTE — Telephone Encounter (Signed)
Pt's medication was sent to pt's pharmacy as requested. Confirmation received.  °

## 2022-11-06 NOTE — Telephone Encounter (Signed)
Pt's medication was resent to pt's preferred pharmacy. Confirmation received.  °

## 2022-11-06 NOTE — Addendum Note (Signed)
Addended by: Margaret Pyle D on: 11/06/2022 11:31 AM   Modules accepted: Orders

## 2022-11-09 ENCOUNTER — Telehealth: Payer: Self-pay

## 2022-11-09 NOTE — Telephone Encounter (Signed)
**Note De-Identified Richard Cook Obfuscation** [name] (Key: [id number]) PA Case ID #: [id number] Outcome Approved on April 5 Coverage Starts on: 08/03/2022 12:00:00 AM, Coverage Ends on: 08/03/2023 12:00:00 AM.  Authorization Expiration Date: 08/02/2023 Drug Mexiletine HCl 150MG  capsules ePA cloud logo Form [employer] Electronic PA Form  I have notified [facility] #[id number] [place], [place] - [address] AT . (Ph: [phone]) of this approval and I did request that they fill the RX and to notify the pt when ready for pick up.

## 2022-11-13 ENCOUNTER — Telehealth: Payer: Self-pay | Admitting: Internal Medicine

## 2022-11-13 ENCOUNTER — Ambulatory Visit (INDEPENDENT_AMBULATORY_CARE_PROVIDER_SITE_OTHER): Payer: Medicare PPO

## 2022-11-13 DIAGNOSIS — I255 Ischemic cardiomyopathy: Secondary | ICD-10-CM

## 2022-11-13 NOTE — Telephone Encounter (Signed)
  1. Has your device fired? no  2. Is you device beeping? no  3. Are you experiencing draining or swelling at device site? no  4. Are you calling to see if we received your device transmission? no  5. Have you passed out? No  Patient is going to be backpacking for two weeks.  He will be hiking for 300 miles, he wants to make sure his defib is working okay.  He wants to make sure everything has been okay.  He wants to make sure his heart hasn't been skipping any beats.     Please route to Device Clinic Pool

## 2022-11-13 NOTE — Telephone Encounter (Signed)
Spoke with patient he is out on a trail now, will send transmission when gets back, may be after 5pm informed patient that we would take a look at it on Monday and call him back patient voiced understanding

## 2022-11-15 LAB — CUP PACEART REMOTE DEVICE CHECK
Battery Remaining Longevity: 25 mo
Battery Voltage: 2.93 V
Brady Statistic RV Percent Paced: 0.02 %
Date Time Interrogation Session: 20240412185833
HighPow Impedance: 92 Ohm
Implantable Lead Connection Status: 753985
Implantable Lead Implant Date: 20150318
Implantable Lead Location: 753860
Implantable Lead Model: 181
Implantable Lead Serial Number: 327195
Implantable Pulse Generator Implant Date: 20150318
Lead Channel Impedance Value: 760 Ohm
Lead Channel Impedance Value: 779 Ohm
Lead Channel Pacing Threshold Amplitude: 0.625 V
Lead Channel Pacing Threshold Pulse Width: 0.4 ms
Lead Channel Sensing Intrinsic Amplitude: 8.625 mV
Lead Channel Sensing Intrinsic Amplitude: 8.625 mV
Lead Channel Setting Pacing Amplitude: 2 V
Lead Channel Setting Pacing Pulse Width: 0.4 ms
Lead Channel Setting Sensing Sensitivity: 0.3 mV

## 2022-11-16 NOTE — Telephone Encounter (Signed)
Called patient to send remote transmission. He will retry and call if he has issues.

## 2022-11-17 ENCOUNTER — Ambulatory Visit: Payer: Medicare PPO | Admitting: Internal Medicine

## 2022-11-17 DIAGNOSIS — M9903 Segmental and somatic dysfunction of lumbar region: Secondary | ICD-10-CM | POA: Diagnosis not present

## 2022-11-17 DIAGNOSIS — M9902 Segmental and somatic dysfunction of thoracic region: Secondary | ICD-10-CM | POA: Diagnosis not present

## 2022-11-17 DIAGNOSIS — M546 Pain in thoracic spine: Secondary | ICD-10-CM | POA: Diagnosis not present

## 2022-11-17 DIAGNOSIS — M9904 Segmental and somatic dysfunction of sacral region: Secondary | ICD-10-CM | POA: Diagnosis not present

## 2022-11-17 NOTE — Telephone Encounter (Signed)
Spoke with patient informed him that there were no episodes recorded and normal device function patient voiced understanding

## 2022-11-18 ENCOUNTER — Other Ambulatory Visit: Payer: Self-pay | Admitting: Family Medicine

## 2022-11-18 ENCOUNTER — Other Ambulatory Visit: Payer: Self-pay

## 2022-11-18 DIAGNOSIS — F5101 Primary insomnia: Secondary | ICD-10-CM

## 2022-11-18 DIAGNOSIS — M9903 Segmental and somatic dysfunction of lumbar region: Secondary | ICD-10-CM | POA: Diagnosis not present

## 2022-11-18 DIAGNOSIS — M9902 Segmental and somatic dysfunction of thoracic region: Secondary | ICD-10-CM | POA: Diagnosis not present

## 2022-11-18 DIAGNOSIS — M9904 Segmental and somatic dysfunction of sacral region: Secondary | ICD-10-CM | POA: Diagnosis not present

## 2022-11-18 DIAGNOSIS — F411 Generalized anxiety disorder: Secondary | ICD-10-CM

## 2022-11-18 DIAGNOSIS — M546 Pain in thoracic spine: Secondary | ICD-10-CM | POA: Diagnosis not present

## 2022-11-18 MED ORDER — BISOPROLOL FUMARATE 5 MG PO TABS: 2.5000 mg | ORAL_TABLET | Freq: Every day | ORAL | 1 refills | Status: DC

## 2022-11-18 MED ORDER — MEXILETINE HCL 150 MG PO CAPS: 150.0000 mg | ORAL_CAPSULE | Freq: Three times a day (TID) | ORAL | 1 refills | Status: DC

## 2022-11-19 DIAGNOSIS — M9904 Segmental and somatic dysfunction of sacral region: Secondary | ICD-10-CM | POA: Diagnosis not present

## 2022-11-19 DIAGNOSIS — M9903 Segmental and somatic dysfunction of lumbar region: Secondary | ICD-10-CM | POA: Diagnosis not present

## 2022-11-19 DIAGNOSIS — M9902 Segmental and somatic dysfunction of thoracic region: Secondary | ICD-10-CM | POA: Diagnosis not present

## 2022-11-19 DIAGNOSIS — M546 Pain in thoracic spine: Secondary | ICD-10-CM | POA: Diagnosis not present

## 2022-11-20 DIAGNOSIS — M9904 Segmental and somatic dysfunction of sacral region: Secondary | ICD-10-CM | POA: Diagnosis not present

## 2022-11-20 DIAGNOSIS — M546 Pain in thoracic spine: Secondary | ICD-10-CM | POA: Diagnosis not present

## 2022-11-20 DIAGNOSIS — M9903 Segmental and somatic dysfunction of lumbar region: Secondary | ICD-10-CM | POA: Diagnosis not present

## 2022-11-20 DIAGNOSIS — M9902 Segmental and somatic dysfunction of thoracic region: Secondary | ICD-10-CM | POA: Diagnosis not present

## 2022-11-23 ENCOUNTER — Ambulatory Visit: Payer: Medicare PPO | Attending: Internal Medicine

## 2022-11-23 ENCOUNTER — Telehealth: Payer: Self-pay | Admitting: Family Medicine

## 2022-11-23 DIAGNOSIS — F5101 Primary insomnia: Secondary | ICD-10-CM

## 2022-11-23 DIAGNOSIS — M546 Pain in thoracic spine: Secondary | ICD-10-CM | POA: Diagnosis not present

## 2022-11-23 DIAGNOSIS — I5022 Chronic systolic (congestive) heart failure: Secondary | ICD-10-CM

## 2022-11-23 DIAGNOSIS — Z95810 Presence of automatic (implantable) cardiac defibrillator: Secondary | ICD-10-CM

## 2022-11-23 DIAGNOSIS — M9902 Segmental and somatic dysfunction of thoracic region: Secondary | ICD-10-CM | POA: Diagnosis not present

## 2022-11-23 DIAGNOSIS — M9903 Segmental and somatic dysfunction of lumbar region: Secondary | ICD-10-CM | POA: Diagnosis not present

## 2022-11-23 DIAGNOSIS — F411 Generalized anxiety disorder: Secondary | ICD-10-CM

## 2022-11-23 DIAGNOSIS — M9904 Segmental and somatic dysfunction of sacral region: Secondary | ICD-10-CM | POA: Diagnosis not present

## 2022-11-23 DIAGNOSIS — E038 Other specified hypothyroidism: Secondary | ICD-10-CM

## 2022-11-23 DIAGNOSIS — E790 Hyperuricemia without signs of inflammatory arthritis and tophaceous disease: Secondary | ICD-10-CM

## 2022-11-23 MED ORDER — CLONAZEPAM 1 MG PO TABS: 1.0000 mg | ORAL_TABLET | Freq: Three times a day (TID) | ORAL | 5 refills | Status: DC | PRN

## 2022-11-23 MED ORDER — LEVOTHYROXINE SODIUM 88 MCG PO TABS: 88.0000 ug | ORAL_TABLET | Freq: Every day | ORAL | 3 refills | Status: DC

## 2022-11-23 MED ORDER — ALLOPURINOL 100 MG PO TABS: 200.0000 mg | ORAL_TABLET | Freq: Every day | ORAL | 4 refills | Status: AC

## 2022-11-23 NOTE — Telephone Encounter (Signed)
Faxed request from [facility] pharmacy

## 2022-11-24 DIAGNOSIS — M9902 Segmental and somatic dysfunction of thoracic region: Secondary | ICD-10-CM | POA: Diagnosis not present

## 2022-11-24 DIAGNOSIS — M546 Pain in thoracic spine: Secondary | ICD-10-CM | POA: Diagnosis not present

## 2022-11-24 DIAGNOSIS — M9903 Segmental and somatic dysfunction of lumbar region: Secondary | ICD-10-CM | POA: Diagnosis not present

## 2022-11-24 DIAGNOSIS — M9904 Segmental and somatic dysfunction of sacral region: Secondary | ICD-10-CM | POA: Diagnosis not present

## 2022-11-25 NOTE — Progress Notes (Signed)
EPIC Encounter for ICM Monitoring  Patient Name: Richard Cook is a 67 y.o. male Date: 11/25/2022 Primary Care Physican: Malva Limes, MD Primary Cardiologist: Graciela Husbands Electrophysiologist: Graciela Husbands Nephrologist: [facility] 05/06/2022 Office Weight: 177 lbs  07/14/2022 Weight:  167 lbs (baseline 165 lbs)     Transmission results reviewed.     Optivol thoracic impedance suggesting intermittent days with possible fluid accumulation.   Prescribed: Furosemide 40 mg take 2 tablets (80 mg total) by mouth daily.   Taking differently: Takes 80 mg every other day.   Labs: 01/19/2022 Creatinine 3.57, BUN 53, Potassium 4.1, Sodium 141, GFR 18 10/29/2021 Creatinine 2.97, BUN 42, Potassium 3.8, Sodium 141, GFR 23 Care Everywhere 09/02/2021 Creatinine 3.09, BUN 55, Potassium 4.3, Sodium 141  Care Everywhere 07/17/2021 Creatinine 3.79, BUN 47, Potassium 3.8, Sodium 141, GFR 17 A complete set of results can be found in Results Review.   Recommendations: No changes   Follow-up plan: ICM clinic phone appointment on 12/29/2022.   91 day device clinic remote transmission 02/12/2023.     EP/Cardiology Office Visits: 01/27/2023 with Dr. Graciela Husbands.      Copy of ICM check sent to Dr. Graciela Husbands.     3 month ICM trend: 11/23/2022.    12-14 Month ICM trend:     Karie Soda, RN 11/25/2022 3:30 PM

## 2022-11-26 DIAGNOSIS — M9904 Segmental and somatic dysfunction of sacral region: Secondary | ICD-10-CM | POA: Diagnosis not present

## 2022-11-26 DIAGNOSIS — M546 Pain in thoracic spine: Secondary | ICD-10-CM | POA: Diagnosis not present

## 2022-11-26 DIAGNOSIS — M9902 Segmental and somatic dysfunction of thoracic region: Secondary | ICD-10-CM | POA: Diagnosis not present

## 2022-11-26 DIAGNOSIS — M9903 Segmental and somatic dysfunction of lumbar region: Secondary | ICD-10-CM | POA: Diagnosis not present

## 2022-11-30 DIAGNOSIS — M546 Pain in thoracic spine: Secondary | ICD-10-CM | POA: Diagnosis not present

## 2022-11-30 DIAGNOSIS — M9902 Segmental and somatic dysfunction of thoracic region: Secondary | ICD-10-CM | POA: Diagnosis not present

## 2022-11-30 DIAGNOSIS — M9904 Segmental and somatic dysfunction of sacral region: Secondary | ICD-10-CM | POA: Diagnosis not present

## 2022-11-30 DIAGNOSIS — M9903 Segmental and somatic dysfunction of lumbar region: Secondary | ICD-10-CM | POA: Diagnosis not present

## 2022-12-03 DIAGNOSIS — M546 Pain in thoracic spine: Secondary | ICD-10-CM | POA: Diagnosis not present

## 2022-12-03 DIAGNOSIS — M9903 Segmental and somatic dysfunction of lumbar region: Secondary | ICD-10-CM | POA: Diagnosis not present

## 2022-12-03 DIAGNOSIS — M9904 Segmental and somatic dysfunction of sacral region: Secondary | ICD-10-CM | POA: Diagnosis not present

## 2022-12-03 DIAGNOSIS — M9902 Segmental and somatic dysfunction of thoracic region: Secondary | ICD-10-CM | POA: Diagnosis not present

## 2022-12-04 DIAGNOSIS — M9904 Segmental and somatic dysfunction of sacral region: Secondary | ICD-10-CM | POA: Diagnosis not present

## 2022-12-04 DIAGNOSIS — M9903 Segmental and somatic dysfunction of lumbar region: Secondary | ICD-10-CM | POA: Diagnosis not present

## 2022-12-04 DIAGNOSIS — M9902 Segmental and somatic dysfunction of thoracic region: Secondary | ICD-10-CM | POA: Diagnosis not present

## 2022-12-04 DIAGNOSIS — M546 Pain in thoracic spine: Secondary | ICD-10-CM | POA: Diagnosis not present

## 2022-12-07 DIAGNOSIS — M9902 Segmental and somatic dysfunction of thoracic region: Secondary | ICD-10-CM | POA: Diagnosis not present

## 2022-12-07 DIAGNOSIS — M9904 Segmental and somatic dysfunction of sacral region: Secondary | ICD-10-CM | POA: Diagnosis not present

## 2022-12-07 DIAGNOSIS — M9903 Segmental and somatic dysfunction of lumbar region: Secondary | ICD-10-CM | POA: Diagnosis not present

## 2022-12-07 DIAGNOSIS — M546 Pain in thoracic spine: Secondary | ICD-10-CM | POA: Diagnosis not present

## 2022-12-09 ENCOUNTER — Telehealth: Payer: Self-pay | Admitting: Internal Medicine

## 2022-12-09 ENCOUNTER — Other Ambulatory Visit (HOSPITAL_COMMUNITY): Payer: Self-pay

## 2022-12-09 NOTE — Telephone Encounter (Signed)
Spoke with pharmacy, patient's copay for a 30 day supply is $39. Copay for the 90 day supply is $117.86.  Insurance prefers Gabapentin. This would be a zero dollar copay.

## 2022-12-09 NOTE — Telephone Encounter (Signed)
Pt c/o medication issue:  1. Name of Medication: mexiletine (MEXITIL) 150 MG capsule   2. How are you currently taking this medication (dosage and times per day)?  Take 1 capsule (150 mg total) by mouth 3 (three) times daily.  3. Are you having a reaction (difficulty breathing--STAT)? No   4. What is your medication issue? Due to the pricing of the medication, the patient wants to know if he could get a lower priced medication

## 2022-12-10 ENCOUNTER — Other Ambulatory Visit (HOSPITAL_COMMUNITY): Payer: Self-pay

## 2022-12-10 DIAGNOSIS — M546 Pain in thoracic spine: Secondary | ICD-10-CM | POA: Diagnosis not present

## 2022-12-10 DIAGNOSIS — M9902 Segmental and somatic dysfunction of thoracic region: Secondary | ICD-10-CM | POA: Diagnosis not present

## 2022-12-10 DIAGNOSIS — M9903 Segmental and somatic dysfunction of lumbar region: Secondary | ICD-10-CM | POA: Diagnosis not present

## 2022-12-10 DIAGNOSIS — M9904 Segmental and somatic dysfunction of sacral region: Secondary | ICD-10-CM | POA: Diagnosis not present

## 2022-12-10 NOTE — Telephone Encounter (Signed)
Patient's copay is 39 dollars for a 30 day supply of mexilitine. Do not know why their plan is recommending gabapentin. Any alternatives he can take?

## 2022-12-10 NOTE — Telephone Encounter (Signed)
Patient will need to pay for Mexiletine or discuss wiith Dr Graciela Husbands for alternatives

## 2022-12-11 NOTE — Telephone Encounter (Signed)
I am not aware of any data, although I have never looked, for gabapentin as replacement for mexiletine.  On cost plus drugs.com it is 27.50.

## 2022-12-16 NOTE — Progress Notes (Signed)
Remote ICD transmission.   

## 2022-12-17 DIAGNOSIS — Z79899 Other long term (current) drug therapy: Secondary | ICD-10-CM | POA: Diagnosis not present

## 2022-12-17 DIAGNOSIS — L03116 Cellulitis of left lower limb: Secondary | ICD-10-CM | POA: Diagnosis not present

## 2022-12-17 DIAGNOSIS — Z79891 Long term (current) use of opiate analgesic: Secondary | ICD-10-CM | POA: Diagnosis not present

## 2022-12-18 ENCOUNTER — Encounter: Payer: Self-pay | Admitting: Family Medicine

## 2022-12-18 ENCOUNTER — Telehealth (INDEPENDENT_AMBULATORY_CARE_PROVIDER_SITE_OTHER): Payer: Medicare PPO | Admitting: Family Medicine

## 2022-12-18 VITALS — BP 97/77 | Wt 166.0 lb

## 2022-12-18 DIAGNOSIS — I5022 Chronic systolic (congestive) heart failure: Secondary | ICD-10-CM | POA: Diagnosis not present

## 2022-12-18 DIAGNOSIS — M5414 Radiculopathy, thoracic region: Secondary | ICD-10-CM

## 2022-12-18 DIAGNOSIS — M47816 Spondylosis without myelopathy or radiculopathy, lumbar region: Secondary | ICD-10-CM | POA: Diagnosis not present

## 2022-12-18 DIAGNOSIS — L989 Disorder of the skin and subcutaneous tissue, unspecified: Secondary | ICD-10-CM | POA: Diagnosis not present

## 2022-12-18 DIAGNOSIS — N185 Chronic kidney disease, stage 5: Secondary | ICD-10-CM | POA: Diagnosis not present

## 2022-12-18 MED ORDER — OXYCODONE-ACETAMINOPHEN 7.5-325 MG PO TABS
1.0000 | ORAL_TABLET | Freq: Two times a day (BID) | ORAL | 0 refills | Status: DC | PRN
Start: 2022-12-18 — End: 2024-06-26

## 2022-12-18 MED ORDER — MELOXICAM 7.5 MG PO TABS
7.5000 mg | ORAL_TABLET | Freq: Every day | ORAL | 0 refills | Status: AC
Start: 2022-12-18 — End: ?

## 2022-12-18 NOTE — Progress Notes (Signed)
I,Joseline E Rosas,acting as a scribe for Mila Merry, MD.,have documented all relevant documentation on the behalf of Mila Merry, MD,as directed by  Mila Merry, MD while in the presence of Mila Merry, MD.   MyChart Video Visit    Virtual Visit via Video Note   This format is felt to be most appropriate for this patient at this time. Physical exam was limited by quality of the video and audio technology used for the visit.   Patient location: home Provider location: [facility]  I discussed the limitations of evaluation and management by telemedicine and the availability of in person appointments. The patient expressed understanding and agreed to proceed.  Patient: Richard Cook   DOB: 1956/05/02   67 y.o. Male  MRN: [id number] Visit Date: 12/18/2022  Today's healthcare provider: Mila Merry, MD   Chief Complaint  Patient presents with   Pain Management   Follow-up   Subjective    HPI  Pain: patient need a refill for oxycodone-acetaminophen. Patient reports that he is not followed by the pain clinic because it didn't work anymore. Reports that he is seeing a chiropractor and would like something for inflammation that is causing the pain in his back.  Patient reports that he went to the ED for Cellulitis. Reports that he had blood work. Patient was prescribed doxycycline 100 mg capsule BID for seven days.   Medications: Outpatient Medications Prior to Visit  Medication Sig   allopurinol (ZYLOPRIM) 100 MG tablet Take 2 tablets (200 mg total) by mouth daily.   aspirin EC 81 MG tablet Take 1 tablet (81 mg total) by mouth daily. Swallow whole.   bisoprolol (ZEBETA) 5 MG tablet Take 0.5 tablets (2.5 mg total) by mouth daily.   cholecalciferol (VITAMIN D3) 25 MCG (1000 UNIT) tablet Take 1,000 Units by mouth daily.   clonazePAM (KLONOPIN) 1 MG tablet Take 1 tablet (1 mg total) by mouth 3 (three) times daily as needed for anxiety.   doxycycline (VIBRAMYCIN) 100 MG  capsule Take 100 mg by mouth 2 (two) times daily.   furosemide (LASIX) 40 MG tablet TAKE 2 TABLETS BY MOUTH EVERY DAY   ibuprofen (ADVIL) 200 MG tablet Take 400 mg by mouth every 6 (six) hours as needed for headache or moderate pain.   ketoconazole (NIZORAL) 2 % shampoo APPLY 1 APPLICATION TOPICALLY 2 TIMES A WEEK   levothyroxine (SYNTHROID) 88 MCG tablet Take 1 tablet (88 mcg total) by mouth daily before breakfast.   mexiletine (MEXITIL) 150 MG capsule Take 1 capsule (150 mg total) by mouth 3 (three) times daily.   Omega-3 Fatty Acids (FISH OIL) 1200 MG CAPS Take 1,200 mg by mouth daily.   oxyCODONE-acetaminophen (PERCOCET) 7.5-325 MG tablet Take 1 tablet by mouth every 8 (eight) hours as needed for severe pain.   No facility-administered medications prior to visit.    Review of Systems     Objective    BP 97/77 (BP Location: Right Arm, Patient Position: Sitting, Cuff Size: Normal) Comment: per patient  Wt 166 lb (75.3 kg) Comment: Per patient  BMI 23.82 kg/m      Physical Exam   Awake, alert, oriented x 3. In no apparent distress   Assessment & Plan     1. Thoracic radiculitis (Right) refill oxyCODONE-acetaminophen (PERCOCET) 7.5-325 MG tablet; Take 1 tablet by mouth 2 (two) times daily as needed for severe pain.  Dispense: 90 tablet; Refill: 0  He would like to try anti-inflammatory medications but does not want  to take prednisone at this time due to side effects. Will try short course of  meloxicam (MOBIC) 7.5 MG tablet; Take 1 tablet (7.5 mg total) by mouth daily.  Dispense: 30 tablet; Refill: 0 However chronic use limited by underlying CKD.   2. Lumbar spondylosis (L4-5 and L5-S1 bulging disks)  - meloxicam (MOBIC) 7.5 MG tablet; Take 1 tablet (7.5 mg total) by mouth daily.  Dispense: 30 tablet; Refill: 0 - oxyCODONE-acetaminophen (PERCOCET) 7.5-325 MG tablet; Take 1 tablet by mouth 2 (two) times daily as needed for severe pain.  Dispense: 90 tablet; Refill: 0  3.  Dermatosis of scalp He states ketoconazole helped initially, but now just having itching that won't go away. Recommend he follow up with dermatology.   4. 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   5. Chronic systolic CHF (congestive heart failure) (HCC) Asymptomatic. Compliant with medication.  Continue aggressive risk factor modification.  Follow up with cardiology as scheduled.      Video connection was lost when less than 50% of the duration of the visit was complete, at which time the remainder of the visit was completed via audio only.  I discussed the assessment and treatment plan with the patient. The patient was provided an opportunity to ask questions and all were answered. The patient agreed with the plan and demonstrated an understanding of the instructions.   The patient was advised to call back or seek an in-person evaluation if the symptoms worsen or if the condition fails to improve as anticipated.  I provided 12 minutes of non-face-to-face time during this encounter.  The entirety of the information documented in the History of Present Illness, Review of Systems and Physical Exam were personally obtained by me. Portions of this information were initially documented by the CMA and reviewed by me for thoroughness and accuracy.    Mila Merry, MD [facility] [phone] (phone) [phone] (fax)  [facility]

## 2022-12-22 NOTE — Telephone Encounter (Signed)
Spoke with pt who states he did receive Mexitil from [employer].  Pt states he recently went to refill his pill box and realized he only has his old bottle.  Pt is afraid he threw away his new bottle.  Pt states he has about 5 tablets left.  Pt advised he will need to contact his insurance company to let them know so they may issue an override for an early refill on the Mexitil.  Pt verbalizes understanding and agrees with current plan.

## 2022-12-25 NOTE — Telephone Encounter (Signed)
**Note De-identified Richard Cook Obfuscation** Error

## 2022-12-26 ENCOUNTER — Other Ambulatory Visit: Payer: Self-pay | Admitting: Family Medicine

## 2022-12-26 DIAGNOSIS — L989 Disorder of the skin and subcutaneous tissue, unspecified: Secondary | ICD-10-CM

## 2022-12-29 ENCOUNTER — Ambulatory Visit: Payer: Medicare PPO | Attending: Internal Medicine

## 2022-12-29 DIAGNOSIS — Z95810 Presence of automatic (implantable) cardiac defibrillator: Secondary | ICD-10-CM

## 2022-12-29 DIAGNOSIS — I5022 Chronic systolic (congestive) heart failure: Secondary | ICD-10-CM | POA: Diagnosis not present

## 2022-12-29 NOTE — Telephone Encounter (Signed)
Requested medications are due for refill today.  yes  Requested medications are on the active medications list.  yes  Last refill. 06/03/2022 120 mL 1 rf  Future visit scheduled.   yes  Notes to clinic.  Refill not delegated.    Requested Prescriptions  Pending Prescriptions Disp Refills   ketoconazole (NIZORAL) 2 % shampoo [Pharmacy Med Name: KETOCONAZOLE 2% SHAMPOO] 120 mL 1    Sig: APPLY 1 APPLICATION TOPICALLY 2 TIMES A WEEK     Not Delegated - Over the Counter: OTC 2 Failed - 12/26/2022  8:16 AM      Failed - This refill cannot be delegated      Passed - Valid encounter within last 12 months    Recent Outpatient Visits           1 week ago Thoracic radiculitis (Right)   [place] [facility] Malva Limes, MD   2 months ago Diarrhea, unspecified type   [facility] [place], Marzella Schlein, MD   7 months ago Dermatosis of scalp   [facility] Malva Limes, MD   11 months ago Chronic gout of ankle, unspecified cause, unspecified laterality   [facility] [place], [place], PA-C   1 year ago Other specified hypothyroidism   [facility] Malva Limes, MD

## 2022-12-31 NOTE — Progress Notes (Signed)
EPIC Encounter for ICM Monitoring  Patient Name: Richard Cook is a 67 y.o. male Date: 12/31/2022 Primary Care Physican: Malva Limes, MD Primary Cardiologist: Graciela Husbands Electrophysiologist: Graciela Husbands Nephrologist: [facility] 05/06/2022 Office Weight: 177 lbs  07/14/2022 Weight:  167 lbs (baseline 165 lbs)     Transmission results reviewed.     Optivol thoracic impedance suggesting normal fluid levels.   Prescribed: Furosemide 40 mg take 2 tablets (80 mg total) by mouth daily.   Taking differently: Takes 80 mg every other day.   Labs: 01/19/2022 Creatinine 3.57, BUN 53, Potassium 4.1, Sodium 141, GFR 18 10/29/2021 Creatinine 2.97, BUN 42, Potassium 3.8, Sodium 141, GFR 23 Care Everywhere 09/02/2021 Creatinine 3.09, BUN 55, Potassium 4.3, Sodium 141  Care Everywhere 07/17/2021 Creatinine 3.79, BUN 47, Potassium 3.8, Sodium 141, GFR 17 A complete set of results can be found in Results Review.   Recommendations: No changes   Follow-up plan: ICM clinic phone appointment on 02/01/2023.   91 day device clinic remote transmission 02/12/2023.     EP/Cardiology Office Visits: 01/27/2023 with Dr. Graciela Husbands.      Copy of ICM check sent to Dr. Graciela Husbands.     3 month ICM trend: 12/29/2022.    12-14 Month ICM trend:     Karie Soda, RN 12/31/2022 3:31 PM

## 2023-01-14 DIAGNOSIS — M545 Low back pain, unspecified: Secondary | ICD-10-CM | POA: Diagnosis not present

## 2023-01-14 DIAGNOSIS — N184 Chronic kidney disease, stage 4 (severe): Secondary | ICD-10-CM | POA: Diagnosis not present

## 2023-01-14 DIAGNOSIS — R300 Dysuria: Secondary | ICD-10-CM | POA: Diagnosis not present

## 2023-01-15 DIAGNOSIS — R300 Dysuria: Secondary | ICD-10-CM | POA: Diagnosis not present

## 2023-01-27 ENCOUNTER — Ambulatory Visit: Payer: Medicare PPO | Admitting: Internal Medicine

## 2023-01-28 DIAGNOSIS — M545 Low back pain, unspecified: Secondary | ICD-10-CM | POA: Diagnosis not present

## 2023-02-01 ENCOUNTER — Telehealth: Payer: Self-pay

## 2023-02-01 ENCOUNTER — Ambulatory Visit: Payer: Medicare PPO | Attending: Internal Medicine

## 2023-02-01 DIAGNOSIS — I5022 Chronic systolic (congestive) heart failure: Secondary | ICD-10-CM

## 2023-02-01 DIAGNOSIS — Z95810 Presence of automatic (implantable) cardiac defibrillator: Secondary | ICD-10-CM

## 2023-02-01 NOTE — Progress Notes (Signed)
EPIC Encounter for ICM Monitoring  Patient Name: Richard Cook is a 67 y.o. male Date: 02/01/2023 Primary Care Physican: Malva Limes, MD Primary Cardiologist: Graciela Husbands Electrophysiologist: Graciela Husbands Nephrologist: [facility] 05/06/2022 Office Weight: 177 lbs  07/14/2022 Weight:  167 lbs (baseline 165 lbs)     Attempted call to patient and unable to reach.  Left detailed message per DPR regarding transmission. Transmission reviewed.    Optivol thoracic impedance suggesting possible fluid accumulation starting 6/15.  Also possible fluid accumulation from 5/27-6/13.   Prescribed: Furosemide 40 mg take 2 tablets (80 mg total) by mouth daily.   Taking differently: Takes 80 mg every other day.   Labs: 01/19/2022 Creatinine 3.57, BUN 53, Potassium 4.1, Sodium 141, GFR 18 10/29/2021 Creatinine 2.97, BUN 42, Potassium 3.8, Sodium 141, GFR 23 Care Everywhere 09/02/2021 Creatinine 3.09, BUN 55, Potassium 4.3, Sodium 141  Care Everywhere 07/17/2021 Creatinine 3.79, BUN 47, Potassium 3.8, Sodium 141, GFR 17 A complete set of results can be found in Results Review.   Recommendations:  Left voice mail with ICM number and encouraged to call if experiencing any fluid symptoms.   Follow-up plan: ICM clinic phone appointment on 02/15/2023 to recheck fluid levels (will have rechecked at 7/8 OV).   91 day device clinic remote transmission 02/12/2023.     EP/Cardiology Office Visits:  02/08/2023 with Dr. Graciela Husbands.      Copy of ICM check sent to Dr. Graciela Husbands.   3 month ICM trend: 02/01/2023.    12-14 Month ICM trend:     Karie Soda, RN 02/01/2023 3:48 PM

## 2023-02-01 NOTE — Telephone Encounter (Signed)
Remote ICM transmission received.  Attempted call to patient regarding ICM remote transmission and left detailed message per DPR.  Left ICM phone number and advised to return call for any fluid symptoms or questions.  

## 2023-02-05 ENCOUNTER — Other Ambulatory Visit: Payer: Self-pay | Admitting: Family Medicine

## 2023-02-05 DIAGNOSIS — L989 Disorder of the skin and subcutaneous tissue, unspecified: Secondary | ICD-10-CM

## 2023-02-05 DIAGNOSIS — M5414 Radiculopathy, thoracic region: Secondary | ICD-10-CM

## 2023-02-05 DIAGNOSIS — M10029 Idiopathic gout, unspecified elbow: Secondary | ICD-10-CM

## 2023-02-05 DIAGNOSIS — M47816 Spondylosis without myelopathy or radiculopathy, lumbar region: Secondary | ICD-10-CM

## 2023-02-05 NOTE — Telephone Encounter (Signed)
Requested medication (s) are due for refill today: Meloxicam: yes  Requested medication (s) are on the active medication list: Meloxicam: yes -- Prednisone x 2 ordered not on AML  Last refill:  Meloxicam: 12/18/22 #30  Future visit scheduled:no  Notes to clinic:  Prednisone not delegated for NT to refuse Overdue lab work    Requested Prescriptions  Pending Prescriptions Disp Refills   meloxicam (MOBIC) 7.5 MG tablet [Pharmacy Med Name: MELOXICAM 7.5 MG TABLET] 30 tablet 0    Sig: TAKE 1 TABLET BY MOUTH EVERY DAY     Analgesics:  COX2 Inhibitors Failed - 02/05/2023  1:40 AM      Failed - Manual Review: Labs are only required if the patient has taken medication for more than 8 weeks.      Failed - HGB in normal range and within 360 days    Hemoglobin  Date Value Ref Range Status  05/28/2021 14.1 13.0 - 17.0 g/dL Final  09/60/4540 98.1 13.0 - 17.7 g/dL Final         Failed - Cr in normal range and within 360 days    Creat  Date Value Ref Range Status  04/07/2017 3.47 (H) 0.70 - 1.25 mg/dL Final    Comment:    For patients >37 years of age, the reference limit for Creatinine is approximately 13% higher for people identified as African-American. .    Creatinine, Ser  Date Value Ref Range Status  01/19/2022 3.57 (H) 0.76 - 1.27 mg/dL Final   Creatinine, Urine  Date Value Ref Range Status  05/13/2016 29.78 mg/dL Final         Failed - HCT in normal range and within 360 days    HCT  Date Value Ref Range Status  05/28/2021 41.7 39.0 - 52.0 % Final   Hematocrit  Date Value Ref Range Status  02/24/2021 50.5 37.5 - 51.0 % Final         Failed - AST in normal range and within 360 days    AST  Date Value Ref Range Status  04/26/2021 25 15 - 41 U/L Final         Failed - ALT in normal range and within 360 days    ALT  Date Value Ref Range Status  04/26/2021 15 0 - 44 U/L Final         Failed - eGFR is 30 or above and within 360 days    GFR calc Af Amer  Date  Value Ref Range Status  03/25/2020 14 (L) >59 mL/min/1.73 Final    Comment:    **[facility] currently reports eGFR in compliance with the current**   recommendations of the [employer]. [facility] will   update reporting as new guidelines are published from the NKF-ASN   Task force.    GFR, Estimated  Date Value Ref Range Status  05/28/2021 24 (L) >60 mL/min Final    Comment:    (NOTE) Calculated using the CKD-EPI Creatinine Equation (2021)    GFR  Date Value Ref Range Status  10/01/2014 37.51 (L) >60.00 mL/min Final   eGFR  Date Value Ref Range Status  01/19/2022 18 (L) >59 mL/min/1.73 Final         Passed - Patient is not pregnant      Passed - Valid encounter within last 12 months    Recent Outpatient Visits           1 month ago Thoracic radiculitis (Right)   [facility] Malva Limes, MD   4 months ago Diarrhea, unspecified type   [facility] [place], Marzella Schlein, MD   9 months ago Dermatosis of scalp   [facility] Malva Limes, MD   1 year ago Chronic gout of ankle, unspecified cause, unspecified laterality   [facility] [place], [place], PA-C   1 year ago Other specified hypothyroidism   [facility] Malva Limes, MD               predniSONE (DELTASONE) 10 MG tablet [Pharmacy Med Name: PREDNISONE 10 MG TABLET] 10 tablet 0    Sig: TAKE 1 TABLET BY MOUTH EVERY DAY AS NEEDED     Not Delegated - Endocrinology:  Oral Corticosteroids Failed - 02/05/2023  1:40 AM      Failed - This refill cannot be delegated      Failed - Manual Review: Eye exam for IOP if prolonged treatment      Failed - Glucose (serum) in normal range and within 180 days    Glucose  Date Value Ref Range Status  01/19/2022 96 70 - 99 mg/dL Final  16/05/9603 83 mg/dL Final   Glucose, Bld  Date Value Ref Range Status  05/28/2021 100 (H)  70 - 99 mg/dL Final    Comment:    Glucose reference range applies only to samples taken after fasting for at least 8 hours.   Glucose-Capillary  Date Value Ref Range Status  08/24/2020 127 (H) 70 - 99 mg/dL Final    Comment:    Glucose reference range applies only to samples taken after fasting for at least 8 hours.         Failed - K in normal range and within 180 days    Potassium  Date Value Ref Range Status  01/19/2022 4.1 3.5 - 5.2 mmol/L Final         Failed - Na in normal range and within 180 days    Sodium  Date Value Ref Range Status  01/19/2022 141 134 - 144 mmol/L Final         Failed - Bone Mineral Density or Dexa Scan completed in the last 2 years      Passed - Last BP in normal range    BP Readings from Last 1 Encounters:  12/18/22 97/77         Passed - Valid encounter within last 6 months    Recent Outpatient Visits           1 month ago Thoracic radiculitis (Right)   [facility] Malva Limes, MD   4 months ago Diarrhea, unspecified type   [facility], Marzella Schlein, MD   9 months ago Dermatosis of scalp   [facility] Malva Limes, MD   1 year ago Chronic gout of ankle, unspecified cause, unspecified laterality   [place] [facility] [place], [place], PA-C   1 year ago Other specified hypothyroidism   [facility] Malva Limes, MD               predniSONE (DELTASONE) 10 MG tablet [Pharmacy Med Name: PREDNISONE 10 MG TABLET] 42 tablet 1    Sig: TAKE 6 TABS BY MOUTH FOR 2 DAYS, 5 TABS FOR 2 DAYS, 4 TABS FOR 2 DAYS, 3 TABS FOR 2 DAYS, 2 TABS FOR  2 DAYS, 1 TAB FOR 2 DAYS     Not Delegated - Endocrinology:  Oral Corticosteroids Failed - 02/05/2023  1:40 AM      Failed - This refill cannot be delegated      Failed - Manual Review: Eye exam for IOP if prolonged treatment      Failed - Glucose (serum) in normal  range and within 180 days    Glucose  Date Value Ref Range Status  01/19/2022 96 70 - 99 mg/dL Final  40/98/1191 83 mg/dL Final   Glucose, Bld  Date Value Ref Range Status  05/28/2021 100 (H) 70 - 99 mg/dL Final    Comment:    Glucose reference range applies only to samples taken after fasting for at least 8 hours.   Glucose-Capillary  Date Value Ref Range Status  08/24/2020 127 (H) 70 - 99 mg/dL Final    Comment:    Glucose reference range applies only to samples taken after fasting for at least 8 hours.         Failed - K in normal range and within 180 days    Potassium  Date Value Ref Range Status  01/19/2022 4.1 3.5 - 5.2 mmol/L Final         Failed - Na in normal range and within 180 days    Sodium  Date Value Ref Range Status  01/19/2022 141 134 - 144 mmol/L Final         Failed - Bone Mineral Density or Dexa Scan completed in the last 2 years      Passed - Last BP in normal range    BP Readings from Last 1 Encounters:  12/18/22 97/77         Passed - Valid encounter within last 6 months    Recent Outpatient Visits           1 month ago Thoracic radiculitis (Right)   [facility] Malva Limes, MD   4 months ago Diarrhea, unspecified type   [facility] [place], Marzella Schlein, MD   9 months ago Dermatosis of scalp   [facility] Malva Limes, MD   1 year ago Chronic gout of ankle, unspecified cause, unspecified laterality   [facility] [place], [place], PA-C   1 year ago Other specified hypothyroidism   [facility] Malva Limes, MD

## 2023-02-08 ENCOUNTER — Ambulatory Visit: Payer: Medicare PPO | Attending: Internal Medicine | Admitting: Internal Medicine

## 2023-02-08 VITALS — BP 106/74 | HR 53 | Ht 70.0 in | Wt 179.8 lb

## 2023-02-08 DIAGNOSIS — Z95810 Presence of automatic (implantable) cardiac defibrillator: Secondary | ICD-10-CM

## 2023-02-08 DIAGNOSIS — I255 Ischemic cardiomyopathy: Secondary | ICD-10-CM | POA: Diagnosis not present

## 2023-02-08 DIAGNOSIS — I472 Ventricular tachycardia, unspecified: Secondary | ICD-10-CM | POA: Diagnosis not present

## 2023-02-08 LAB — CUP PACEART INCLINIC DEVICE CHECK
Battery Remaining Longevity: 26 mo
Battery Voltage: 2.93 V
Brady Statistic RV Percent Paced: 0.06 %
Date Time Interrogation Session: 20240708133856
HighPow Impedance: 84 Ohm
Implantable Lead Connection Status: 753985
Implantable Lead Implant Date: 20150318
Implantable Lead Location: 753860
Implantable Lead Model: 181
Implantable Lead Serial Number: 327195
Implantable Pulse Generator Implant Date: 20150318
Lead Channel Impedance Value: 703 Ohm
Lead Channel Impedance Value: 703 Ohm
Lead Channel Pacing Threshold Amplitude: 0.75 V
Lead Channel Pacing Threshold Pulse Width: 0.4 ms
Lead Channel Sensing Intrinsic Amplitude: 11.25 mV
Lead Channel Sensing Intrinsic Amplitude: 9.5 mV
Lead Channel Setting Pacing Amplitude: 2 V
Lead Channel Setting Pacing Pulse Width: 0.4 ms
Lead Channel Setting Sensing Sensitivity: 0.3 mV
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
## 2023-02-08 NOTE — Patient Instructions (Signed)
Medication Instructions:  Your physician recommends that you continue on your current medications as directed. Please refer to the Current Medication list given to you today.  *If you need a refill on your cardiac medications before your next appointment, please call your pharmacy*   Lab Work: None ordered   Testing/Procedures: None ordered   Follow-Up: At [facility], you and your health needs are our priority.  As part of our continuing mission to provide you with exceptional heart care, we have created designated Provider Care Teams.  These Care Teams include your primary Cardiologist (physician) and Advanced Practice Providers (APPs -  Physician Assistants and Nurse Practitioners) who all work together to provide you with the care you need, when you need it.  We recommend signing up for the patient portal called "MyChart".  Sign up information is provided on this After Visit Summary.  MyChart is used to connect with patients for Virtual Visits (Telemedicine).  Patients are able to view lab/test results, encounter notes, upcoming appointments, etc.  Non-urgent messages can be sent to your provider as well.   To learn more about what you can do with MyChart, go to [email or web address].    Remote monitoring is used to monitor your Pacemaker or ICD from home. This monitoring reduces the number of office visits required to check your device to one time per year. It allows [place] to keep an eye on the functioning of your device to ensure it is working properly. You are scheduled for a device check from home on 02/12/23, 05/14/23. You may send your transmission at any time that day. If you have a wireless device, the transmission will be sent automatically. After your physician reviews your transmission, you will receive a postcard with your next transmission date.  Your next appointment:   6 month(s)  The format for your next appointment:   In Person  Provider:   Sherryl Manges,  MD{  Thank you for choosing [facility]!!   [phone]

## 2023-02-12 ENCOUNTER — Ambulatory Visit: Payer: Medicare PPO | Attending: Family Medicine

## 2023-02-15 ENCOUNTER — Ambulatory Visit: Payer: Medicare PPO | Attending: Internal Medicine

## 2023-02-15 DIAGNOSIS — Z95810 Presence of automatic (implantable) cardiac defibrillator: Secondary | ICD-10-CM

## 2023-02-15 DIAGNOSIS — I5022 Chronic systolic (congestive) heart failure: Secondary | ICD-10-CM

## 2023-02-17 NOTE — Progress Notes (Signed)
EPIC Encounter for ICM Monitoring  Patient Name: Richard Cook is a 67 y.o. male Date: 02/17/2023 Primary Care Physican: Malva Limes, MD Primary Cardiologist: Graciela Husbands Electrophysiologist: Graciela Husbands Nephrologist: [facility] 02/08/2023 Office Weight: 179 lbs    Transmission reviewed.    Optivol thoracic impedance suggesting fluid levels returned close to normal.     Prescribed: Furosemide 40 mg take 2 tablets (80 mg total) by mouth daily.   Taking differently: Takes 80 mg every other day.   Labs: 01/19/2022 Creatinine 3.57, BUN 53, Potassium 4.1, Sodium 141, GFR 18 10/29/2021 Creatinine 2.97, BUN 42, Potassium 3.8, Sodium 141, GFR 23 Care Everywhere 09/02/2021 Creatinine 3.09, BUN 55, Potassium 4.3, Sodium 141  Care Everywhere 07/17/2021 Creatinine 3.79, BUN 47, Potassium 3.8, Sodium 141, GFR 17 A complete set of results can be found in Results Review.   Recommendations:  No changes.   Follow-up plan: ICM clinic phone appointment on 03/15/2023.   91 day device clinic remote transmission 05/14/2023.     EP/Cardiology Office Visits:  Recall 08/07/2023 with Dr. Graciela Husbands (6 month).      Copy of ICM check sent to Dr. Graciela Husbands.   3 month ICM trend: 02/15/2023.    12-14 Month ICM trend:     Karie Soda, RN 02/17/2023 10:26 AM

## 2023-03-04 DIAGNOSIS — M545 Low back pain, unspecified: Secondary | ICD-10-CM | POA: Diagnosis not present

## 2023-03-04 DIAGNOSIS — I5042 Chronic combined systolic (congestive) and diastolic (congestive) heart failure: Secondary | ICD-10-CM | POA: Diagnosis not present

## 2023-03-04 DIAGNOSIS — I255 Ischemic cardiomyopathy: Secondary | ICD-10-CM | POA: Diagnosis not present

## 2023-03-04 DIAGNOSIS — I2510 Atherosclerotic heart disease of native coronary artery without angina pectoris: Secondary | ICD-10-CM | POA: Diagnosis not present

## 2023-03-04 DIAGNOSIS — N189 Chronic kidney disease, unspecified: Secondary | ICD-10-CM | POA: Diagnosis not present

## 2023-03-04 DIAGNOSIS — K219 Gastro-esophageal reflux disease without esophagitis: Secondary | ICD-10-CM | POA: Diagnosis not present

## 2023-03-05 ENCOUNTER — Telehealth: Payer: Self-pay | Admitting: Internal Medicine

## 2023-03-05 NOTE — Telephone Encounter (Signed)
Pt c/o medication issue:  1. Name of Medication: mexiletine (MEXITIL) 150 MG capsule   2. How are you currently taking this medication (dosage and times per day)?   Take 1 capsule (150 mg total) by mouth 3 (three) times daily.    3. Are you having a reaction (difficulty breathing--STAT)? No  4. What is your medication issue? Pt stating this medication is causing him dizziness and blurred vision. Please advise.

## 2023-03-05 NOTE — Telephone Encounter (Signed)
Spoke with pt who complains of dizziness and blurred vision x 1 year.  Denies current CP, SOB, dizziness or weakness.  Pt states he read the warning on his Mexiletine bottle which advises of possible blurred vision and dizziness.    He states he went to the optometrist last year and was prescribed glasses.  He states the glasses did not help.  He still has a haziness and was told he may have cataracts.  Pt is due for yearly eye exam now and needs to schedule.  Pt states he made Dr Graciela Husbands aware of dizziness at his last appointment 2 weeks ago on 02/08/2023. He reports balance issues since 2018.  His BP and HR have been WNL.  No current readings.  Device interrogation on 07/08 was normal with no changes made.  Pt states he is not asking to stop Mexiletine but wonders if this could be reason for these chronic issues.  Pt advised will forward to Dr Graciela Husbands for his thoughts.  Pt verbalizes understanding and agrees with current plan

## 2023-03-11 ENCOUNTER — Telehealth: Payer: Self-pay | Admitting: Family Medicine

## 2023-03-11 NOTE — Telephone Encounter (Signed)
Patient cancelled his AWV on 01/19/2023 because he has moved-per cancellation reason.

## 2023-03-22 NOTE — Progress Notes (Signed)
No ICM remote transmission received for 03/15/2023 and next ICM transmission scheduled for 04/06/2023.

## 2023-03-25 DIAGNOSIS — I5042 Chronic combined systolic (congestive) and diastolic (congestive) heart failure: Secondary | ICD-10-CM | POA: Diagnosis not present

## 2023-03-25 DIAGNOSIS — K5900 Constipation, unspecified: Secondary | ICD-10-CM | POA: Diagnosis not present

## 2023-04-06 ENCOUNTER — Ambulatory Visit: Payer: Medicare PPO | Attending: Internal Medicine

## 2023-04-06 DIAGNOSIS — I5022 Chronic systolic (congestive) heart failure: Secondary | ICD-10-CM | POA: Diagnosis not present

## 2023-04-06 DIAGNOSIS — Z95810 Presence of automatic (implantable) cardiac defibrillator: Secondary | ICD-10-CM

## 2023-04-09 NOTE — Progress Notes (Signed)
EPIC Encounter for ICM Monitoring  Patient Name: Richard Cook is a 67 y.o. male Date: 04/09/2023 Primary Care Physican: No primary care provider on file. Primary Cardiologist: Graciela Husbands Electrophysiologist: Graciela Husbands Nephrologist: [facility] 02/08/2023 Office Weight: 179 lbs    Transmission reviewed.    Optivol thoracic impedance suggesting normal fluid levels within the last month.     Prescribed: Furosemide 40 mg take 2 tablets (80 mg total) by mouth daily.   Taking differently: Takes 80 mg every other day.   Labs: 01/19/2022 Creatinine 3.57, BUN 53, Potassium 4.1, Sodium 141, GFR 18 10/29/2021 Creatinine 2.97, BUN 42, Potassium 3.8, Sodium 141, GFR 23 Care Everywhere 09/02/2021 Creatinine 3.09, BUN 55, Potassium 4.3, Sodium 141  Care Everywhere 07/17/2021 Creatinine 3.79, BUN 47, Potassium 3.8, Sodium 141, GFR 17 A complete set of results can be found in Results Review.   Recommendations:  No changes.   Follow-up plan: ICM clinic phone appointment on 05/10/2023.   91 day device clinic remote transmission 05/14/2023.     EP/Cardiology Office Visits:  Recall 08/07/2023 with Dr. Graciela Husbands (6 month).      Copy of ICM check sent to Dr. Graciela Husbands.   3 month ICM trend: 04/06/2023.    12-14 Month ICM trend:     Karie Soda, RN 04/09/2023 1:40 PM

## 2023-04-26 NOTE — Telephone Encounter (Signed)
He is using the mex for VT.   We can certainly try holding it for 3-4 weeks, and see if his symptoms get better.  Probably wont have VT :((  Sherryl Manges, MD 04/26/2023 10:12 AM

## 2023-05-05 DIAGNOSIS — M25551 Pain in right hip: Secondary | ICD-10-CM | POA: Diagnosis not present

## 2023-05-05 DIAGNOSIS — Z131 Encounter for screening for diabetes mellitus: Secondary | ICD-10-CM | POA: Diagnosis not present

## 2023-05-05 DIAGNOSIS — M7918 Myalgia, other site: Secondary | ICD-10-CM | POA: Diagnosis not present

## 2023-05-05 DIAGNOSIS — M25552 Pain in left hip: Secondary | ICD-10-CM | POA: Diagnosis not present

## 2023-05-05 DIAGNOSIS — I5042 Chronic combined systolic (congestive) and diastolic (congestive) heart failure: Secondary | ICD-10-CM | POA: Diagnosis not present

## 2023-05-05 DIAGNOSIS — I255 Ischemic cardiomyopathy: Secondary | ICD-10-CM | POA: Diagnosis not present

## 2023-05-05 DIAGNOSIS — Z13220 Encounter for screening for lipoid disorders: Secondary | ICD-10-CM | POA: Diagnosis not present

## 2023-05-05 DIAGNOSIS — Z13228 Encounter for screening for other metabolic disorders: Secondary | ICD-10-CM | POA: Diagnosis not present

## 2023-05-05 DIAGNOSIS — Z1321 Encounter for screening for nutritional disorder: Secondary | ICD-10-CM | POA: Diagnosis not present

## 2023-05-05 DIAGNOSIS — Z130 Encounter for screening for diseases of the blood and blood-forming organs and certain disorders involving the immune mechanism: Secondary | ICD-10-CM | POA: Diagnosis not present

## 2023-05-05 DIAGNOSIS — N184 Chronic kidney disease, stage 4 (severe): Secondary | ICD-10-CM | POA: Diagnosis not present

## 2023-05-05 DIAGNOSIS — E039 Hypothyroidism, unspecified: Secondary | ICD-10-CM | POA: Diagnosis not present

## 2023-05-10 ENCOUNTER — Ambulatory Visit: Payer: Medicare PPO | Attending: Internal Medicine

## 2023-05-10 DIAGNOSIS — I5022 Chronic systolic (congestive) heart failure: Secondary | ICD-10-CM

## 2023-05-10 DIAGNOSIS — Z95810 Presence of automatic (implantable) cardiac defibrillator: Secondary | ICD-10-CM

## 2023-05-14 ENCOUNTER — Ambulatory Visit (INDEPENDENT_AMBULATORY_CARE_PROVIDER_SITE_OTHER): Payer: Medicare PPO

## 2023-05-14 DIAGNOSIS — I255 Ischemic cardiomyopathy: Secondary | ICD-10-CM | POA: Diagnosis not present

## 2023-05-14 LAB — CUP PACEART REMOTE DEVICE CHECK
Battery Remaining Longevity: 22 mo
Battery Voltage: 2.92 V
Brady Statistic RV Percent Paced: 0.04 %
Date Time Interrogation Session: 20241011022602
HighPow Impedance: 80 Ohm
Implantable Lead Connection Status: 753985
Implantable Lead Implant Date: 20150318
Implantable Lead Location: 753860
Implantable Lead Model: 181
Implantable Lead Serial Number: 327195
Implantable Pulse Generator Implant Date: 20150318
Lead Channel Impedance Value: 646 Ohm
Lead Channel Impedance Value: 665 Ohm
Lead Channel Pacing Threshold Amplitude: 0.625 V
Lead Channel Pacing Threshold Pulse Width: 0.4 ms
Lead Channel Sensing Intrinsic Amplitude: 10 mV
Lead Channel Sensing Intrinsic Amplitude: 10 mV
Lead Channel Setting Pacing Amplitude: 2 V
Lead Channel Setting Pacing Pulse Width: 0.4 ms
Lead Channel Setting Sensing Sensitivity: 0.3 mV

## 2023-05-18 NOTE — Progress Notes (Signed)
EPIC Encounter for ICM Monitoring  Patient Name: Richard Cook is a 67 y.o. male Date: 05/18/2023 Primary Care Physican: No primary care provider on file. Primary Cardiologist: Graciela Husbands Electrophysiologist: Graciela Husbands Nephrologist: [facility] 02/08/2023 Office Weight: 179 lbs    Transmission reviewed.    Optivol thoracic impedance suggesting normal fluid levels within the last month.     Prescribed: Furosemide 40 mg take 2 tablets (80 mg total) by mouth daily.   Taking differently: Takes 80 mg every other day.   Labs: 01/19/2022 Creatinine 3.57, BUN 53, Potassium 4.1, Sodium 141, GFR 18 10/29/2021 Creatinine 2.97, BUN 42, Potassium 3.8, Sodium 141, GFR 23 Care Everywhere 09/02/2021 Creatinine 3.09, BUN 55, Potassium 4.3, Sodium 141  Care Everywhere 07/17/2021 Creatinine 3.79, BUN 47, Potassium 3.8, Sodium 141, GFR 17 A complete set of results can be found in Results Review.   Recommendations:  No changes.   Follow-up plan: ICM clinic phone appointment on 06/14/2023.   91 day device clinic remote transmission 08/13/2023.     EP/Cardiology Office Visits:  Recall 08/07/2023 with Dr. Graciela Husbands (6 month).      Copy of ICM check sent to Dr. Graciela Husbands.   3 month ICM trend: 05/10/2023.    12-14 Month ICM trend:     Karie Soda, RN 05/18/2023 2:05 PM

## 2023-05-21 MED ORDER — BISOPROLOL FUMARATE 5 MG PO TABS: 2.5000 mg | ORAL_TABLET | Freq: Every day | ORAL | 1 refills | Status: DC

## 2023-05-21 NOTE — Telephone Encounter (Signed)
Spoke with pt and advised per Dr Graciela Husbands he may hold Mexiletine x 3-4 weeks to see if his symptoms of blurred vision and dizziness resolve.  Pt advised to provide update regarding symptoms.  Pt also asking for refill of Bisoprolol 5mg  - 1/2 tablet by mouth daily.   Medication refilled as requested..  Pt is due to be seen January 2025. Appointment scheduled for 08/30/2023 at 230pm.  Pt verbalizes understanding and agrees with plan

## 2023-05-22 ENCOUNTER — Other Ambulatory Visit: Payer: Self-pay | Admitting: Family Medicine

## 2023-05-24 NOTE — Telephone Encounter (Signed)
Requested medication (s) are due for refill today:   Requested medication (s) are on the active medication list: No  Last refill:    Future visit scheduled: No  Notes to clinic:  Not on medication list. Manual review.    Requested Prescriptions  Pending Prescriptions Disp Refills   ipratropium (ATROVENT) 0.03 % nasal spray [Pharmacy Med Name: IPRATROPIUM 0.03% SPRAY]  2    Sig: PLACE 2 SPRAYS INTO BOTH NOSTRILS EVERY 12 (TWELVE) HOURS.     Off-Protocol Failed - 05/22/2023  8:59 AM      Failed - Medication not assigned to a protocol, review manually.      Passed - Valid encounter within last 12 months    Recent Outpatient Visits           5 months ago Thoracic radiculitis (Right)   [place] [facility] Malva Limes, MD   7 months ago Diarrhea, unspecified type   [facility] [place], Marzella Schlein, MD   1 year ago Dermatosis of scalp   [place] [facility] Malva Limes, MD   1 year ago Chronic gout of ankle, unspecified cause, unspecified laterality   [place] [facility] [place], [place], PA-C   1 year ago Other specified hypothyroidism   [facility] Malva Limes, MD             Off-Protocol Failed - 05/22/2023  8:59 AM      Failed - Medication not assigned to a protocol, review manually.      Passed - Valid encounter within last 12 months    Recent Outpatient Visits           5 months ago Thoracic radiculitis (Right)   [place] [facility] Malva Limes, MD   7 months ago Diarrhea, unspecified type   [facility] [place], Marzella Schlein, MD   1 year ago Dermatosis of scalp   [place] [facility] Malva Limes, MD   1 year ago Chronic gout of ankle, unspecified cause, unspecified laterality   [facility] [place], [place], PA-C   1 year ago Other specified  hypothyroidism   [facility] Malva Limes, MD

## 2023-05-24 NOTE — Telephone Encounter (Signed)
Please advise 

## 2023-05-24 NOTE — Progress Notes (Signed)
Remote ICD transmission.   

## 2023-06-01 ENCOUNTER — Encounter: Payer: Medicare PPO | Admitting: Internal Medicine

## 2023-06-08 DIAGNOSIS — N184 Chronic kidney disease, stage 4 (severe): Secondary | ICD-10-CM | POA: Diagnosis not present

## 2023-06-08 DIAGNOSIS — M545 Low back pain, unspecified: Secondary | ICD-10-CM | POA: Diagnosis not present

## 2023-06-08 DIAGNOSIS — I781 Nevus, non-neoplastic: Secondary | ICD-10-CM | POA: Diagnosis not present

## 2023-06-13 ENCOUNTER — Other Ambulatory Visit: Payer: Self-pay | Admitting: Family Medicine

## 2023-06-13 DIAGNOSIS — F5101 Primary insomnia: Secondary | ICD-10-CM

## 2023-06-13 DIAGNOSIS — F411 Generalized anxiety disorder: Secondary | ICD-10-CM

## 2023-06-14 ENCOUNTER — Ambulatory Visit: Payer: Medicare PPO | Attending: Internal Medicine

## 2023-06-14 ENCOUNTER — Telehealth: Payer: Self-pay

## 2023-06-14 DIAGNOSIS — Z95810 Presence of automatic (implantable) cardiac defibrillator: Secondary | ICD-10-CM | POA: Diagnosis not present

## 2023-06-14 DIAGNOSIS — I5022 Chronic systolic (congestive) heart failure: Secondary | ICD-10-CM | POA: Diagnosis not present

## 2023-06-14 DIAGNOSIS — D225 Melanocytic nevi of trunk: Secondary | ICD-10-CM | POA: Diagnosis not present

## 2023-06-14 DIAGNOSIS — D492 Neoplasm of unspecified behavior of bone, soft tissue, and skin: Secondary | ICD-10-CM | POA: Diagnosis not present

## 2023-06-14 DIAGNOSIS — L821 Other seborrheic keratosis: Secondary | ICD-10-CM | POA: Diagnosis not present

## 2023-06-14 DIAGNOSIS — L570 Actinic keratosis: Secondary | ICD-10-CM | POA: Diagnosis not present

## 2023-06-14 DIAGNOSIS — Z7189 Other specified counseling: Secondary | ICD-10-CM | POA: Diagnosis not present

## 2023-06-14 NOTE — Telephone Encounter (Signed)
Remote ICM transmission received.  Attempted call to patient regarding ICM remote transmission and left detailed message per DPR.  Left ICM phone number and advised to return call for any fluid symptoms or questions. Next ICM remote transmission scheduled 06/21/2023.

## 2023-06-14 NOTE — Progress Notes (Signed)
EPIC Encounter for ICM Monitoring  Patient Name: Richard Cook is a 67 y.o. male Date: 06/14/2023 Primary Care Physican: No primary care provider on file. Primary Cardiologist: Graciela Husbands Electrophysiologist: Graciela Husbands Nephrologist: [facility] 02/08/2023 Office Weight: 179 lbs    Attempted call to patient and unable to reach.  Left detailed message per DPR regarding transmission. Transmission reviewed.    Optivol thoracic impedance suggesting possible fluid accumulation starting 11/2.     Prescribed: Furosemide 40 mg take 2 tablets (80 mg total) by mouth daily.   Taking differently: Takes 80 mg every other day.   Labs: 01/19/2022 Creatinine 3.57, BUN 53, Potassium 4.1, Sodium 141, GFR 18 10/29/2021 Creatinine 2.97, BUN 42, Potassium 3.8, Sodium 141, GFR 23 Care Everywhere 09/02/2021 Creatinine 3.09, BUN 55, Potassium 4.3, Sodium 141  Care Everywhere 07/17/2021 Creatinine 3.79, BUN 47, Potassium 3.8, Sodium 141, GFR 17 A complete set of results can be found in Results Review.   Recommendations:  Left voice mail with ICM number and encouraged to call if experiencing any fluid symptoms.   Follow-up plan: ICM clinic phone appointment on 06/21/2023 to recheck fluid levels.   91 day device clinic remote transmission 08/13/2023.     EP/Cardiology Office Visits:  Recall 08/07/2023 with Dr. Graciela Husbands (6 month).      Copy of ICM check sent to Dr. Graciela Husbands.   3 month ICM trend: 06/14/2023.    12-14 Month ICM trend:     Karie Soda, RN 06/14/2023 2:55 PM

## 2023-06-16 ENCOUNTER — Telehealth: Payer: Self-pay

## 2023-06-16 NOTE — Telephone Encounter (Signed)
Pt returning nurses phone call from the phone note on 11/11. Please advise

## 2023-06-21 ENCOUNTER — Ambulatory Visit: Payer: Medicare PPO | Attending: Internal Medicine

## 2023-06-21 ENCOUNTER — Telehealth: Payer: Self-pay

## 2023-06-21 DIAGNOSIS — I5022 Chronic systolic (congestive) heart failure: Secondary | ICD-10-CM

## 2023-06-21 DIAGNOSIS — Z95810 Presence of automatic (implantable) cardiac defibrillator: Secondary | ICD-10-CM

## 2023-06-21 NOTE — Progress Notes (Signed)
EPIC Encounter for ICM Monitoring  Patient Name: Richard Cook is a 67 y.o. male Date: 06/21/2023 Primary Care Physican: No primary care provider on file. Primary Cardiologist: Graciela Husbands Electrophysiologist: Graciela Husbands Nephrologist: [facility] 02/08/2023 Office Weight: 179 lbs    Attempted call to patient and unable to reach.  Left detailed message per DPR regarding transmission. Transmission reviewed.    Optivol thoracic impedance suggesting fluid levels returned to normal.     Prescribed: Furosemide 40 mg take 2 tablets (80 mg total) by mouth daily.   Taking differently: Takes 80 mg every other day.   Labs: 01/19/2022 Creatinine 3.57, BUN 53, Potassium 4.1, Sodium 141, GFR 18 10/29/2021 Creatinine 2.97, BUN 42, Potassium 3.8, Sodium 141, GFR 23 Care Everywhere 09/02/2021 Creatinine 3.09, BUN 55, Potassium 4.3, Sodium 141  Care Everywhere 07/17/2021 Creatinine 3.79, BUN 47, Potassium 3.8, Sodium 141, GFR 17 A complete set of results can be found in Results Review.   Recommendations:  Left voice mail with ICM number and encouraged to call if experiencing any fluid symptoms.   Follow-up plan: ICM clinic phone appointment on 08/02/2023.   91 day device clinic remote transmission 08/13/2023.     EP/Cardiology Office Visits:  08/30/2023 with Dr. Graciela Husbands (6 month).      Copy of ICM check sent to Dr. Graciela Husbands.   3 month ICM trend: 06/21/2023.    12-14 Month ICM trend:     Karie Soda, RN 06/21/2023 1:51 PM

## 2023-06-21 NOTE — Telephone Encounter (Signed)
 Remote ICM transmission received.  Attempted call to patient regarding ICM remote transmission and left detailed message per DPR.  Left ICM phone number and advised to return call for any fluid symptoms or questions. Next ICM remote transmission scheduled 08/02/2023.

## 2023-06-30 ENCOUNTER — Telehealth: Payer: Self-pay

## 2023-06-30 NOTE — Telephone Encounter (Signed)
Spoke with pt and advised per Dr Graciela Husbands pt may continue to hold Mexiletine as long as he is not having episodes of VT.  Pt is scheduled for follow up with Dr Graciela Husbands 08/30/2023.  Pt verbalizes understanding and thanked Charity fundraiser for the call.

## 2023-06-30 NOTE — Telephone Encounter (Signed)
Returned call to patient as requested by voice mail message.  He reported 6 lb weight gain over the last few days.  Weight increased to 179 lbs and has returned to 173 lbs today.  He was concerned he may have fluid weight and send [facility] remote transmission.  Reviewed transmission and advised fluid levels are normal.  He does takes Furosemide 80 mg every other day.  He does not have lower leg swelling or SOB.  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    Pt stated he had spoke with Dr Odessa Fleming nurse a couple of months ago about stopping Mexiletine due to side effects of balance and vision issues.  Vision has improved since he stopped it and balance has been restored.    He is unsure if should restart Mexiletine and would like a call from Dr Koren Bound office to discuss.  Advised will forward to Dr Odessa Fleming nurse for follow up.     Optivol thoracic impedance suggesting normal fluid levels since 11/13.

## 2023-08-02 ENCOUNTER — Ambulatory Visit: Payer: Medicare PPO | Attending: Internal Medicine

## 2023-08-02 DIAGNOSIS — Z95810 Presence of automatic (implantable) cardiac defibrillator: Secondary | ICD-10-CM | POA: Diagnosis not present

## 2023-08-02 DIAGNOSIS — I5022 Chronic systolic (congestive) heart failure: Secondary | ICD-10-CM

## 2023-08-13 ENCOUNTER — Ambulatory Visit (INDEPENDENT_AMBULATORY_CARE_PROVIDER_SITE_OTHER): Payer: Medicare PPO

## 2023-08-13 DIAGNOSIS — I255 Ischemic cardiomyopathy: Secondary | ICD-10-CM

## 2023-08-14 LAB — CUP PACEART REMOTE DEVICE CHECK
Battery Remaining Longevity: 19 mo
Battery Voltage: 2.91 V
Brady Statistic RV Percent Paced: 0.01 %
Date Time Interrogation Session: 20250110012403
HighPow Impedance: 102 Ohm
Implantable Lead Connection Status: 753985
Implantable Lead Implant Date: 20150318
Implantable Lead Location: 753860
Implantable Lead Model: 181
Implantable Lead Serial Number: 327195
Implantable Pulse Generator Implant Date: 20150318
Lead Channel Impedance Value: 779 Ohm
Lead Channel Impedance Value: 779 Ohm
Lead Channel Pacing Threshold Amplitude: 0.5 V
Lead Channel Pacing Threshold Pulse Width: 0.4 ms
Lead Channel Sensing Intrinsic Amplitude: 10.875 mV
Lead Channel Sensing Intrinsic Amplitude: 10.875 mV
Lead Channel Setting Pacing Amplitude: 2 V
Lead Channel Setting Pacing Pulse Width: 0.4 ms
Lead Channel Setting Sensing Sensitivity: 0.3 mV

## 2023-08-26 ENCOUNTER — Other Ambulatory Visit: Payer: Self-pay | Admitting: Family Medicine

## 2023-08-30 ENCOUNTER — Ambulatory Visit: Payer: Medicare PPO | Admitting: Internal Medicine

## 2023-08-30 DIAGNOSIS — I255 Ischemic cardiomyopathy: Secondary | ICD-10-CM

## 2023-08-30 DIAGNOSIS — I472 Ventricular tachycardia, unspecified: Secondary | ICD-10-CM

## 2023-08-30 DIAGNOSIS — Z95810 Presence of automatic (implantable) cardiac defibrillator: Secondary | ICD-10-CM

## 2023-08-30 DIAGNOSIS — I5022 Chronic systolic (congestive) heart failure: Secondary | ICD-10-CM

## 2023-09-06 ENCOUNTER — Ambulatory Visit: Payer: Medicare PPO | Attending: Internal Medicine

## 2023-09-06 DIAGNOSIS — I5022 Chronic systolic (congestive) heart failure: Secondary | ICD-10-CM | POA: Diagnosis not present

## 2023-09-06 DIAGNOSIS — Z95810 Presence of automatic (implantable) cardiac defibrillator: Secondary | ICD-10-CM | POA: Diagnosis not present

## 2023-09-06 NOTE — Progress Notes (Signed)
EPIC Encounter for ICM Monitoring  Patient Name: Richard Cook is a 68 y.o. male Date: 09/06/2023 Primary Care Physican: No primary care provider on file. Primary Cardiologist: Graciela Husbands Electrophysiologist: Graciela Husbands Nephrologist: [facility] 02/08/2023 Office Weight: 179 lbs 09/06/2023 Weight: 168 lbs (baseline)     Spoke with patient and heart failure questions reviewed.  Transmission results reviewed.  Pt reports weight gain of 3 lbs (171 lbs) in the last week and could be related to taking steroids for gout since 1/27.       Optivol thoracic impedance suggesting possible fluid accumulation starting 1/27.     Prescribed: Furosemide 40 mg take 2 tablets (80 mg total) by mouth daily.      Labs: 01/19/2022 Creatinine 3.57, BUN 53, Potassium 4.1, Sodium 141, GFR 18 10/29/2021 Creatinine 2.97, BUN 42, Potassium 3.8, Sodium 141, GFR 23 Care Everywhere 09/02/2021 Creatinine 3.09, BUN 55, Potassium 4.3, Sodium 141  Care Everywhere 07/17/2021 Creatinine 3.79, BUN 47, Potassium 3.8, Sodium 141, GFR 17 A complete set of results can be found in Results Review.   Recommendations:  He took extra Lasix last night with good urine output and weight loss of 3 lbs.   Follow-up plan: ICM clinic phone appointment on 10/11/2023 (fluid levels will be rechecked in office 2/5 by Dr Graciela Husbands).   91 day device clinic remote transmission 11/12/2023.     EP/Cardiology Office Visits:  09/08/2023 with Dr. Graciela Husbands (6 month).      Copy of ICM check sent to Dr. Graciela Husbands.   3 month ICM trend: 09/06/2023.    12-14 Month ICM trend:     Karie Soda, RN 09/06/2023 4:19 PM

## 2023-09-08 ENCOUNTER — Ambulatory Visit: Payer: Medicare PPO | Attending: Internal Medicine | Admitting: Internal Medicine

## 2023-09-08 VITALS — BP 98/64 | HR 75 | Ht 70.0 in | Wt 175.2 lb

## 2023-09-08 DIAGNOSIS — Z95810 Presence of automatic (implantable) cardiac defibrillator: Secondary | ICD-10-CM | POA: Diagnosis not present

## 2023-09-08 DIAGNOSIS — I472 Ventricular tachycardia, unspecified: Secondary | ICD-10-CM

## 2023-09-08 DIAGNOSIS — I255 Ischemic cardiomyopathy: Secondary | ICD-10-CM

## 2023-09-08 NOTE — Patient Instructions (Signed)
 Medication Instructions:  Your physician recommends that you continue on your current medications as directed. Please refer to the Current Medication list given to you today.  *If you need a refill on your cardiac medications before your next appointment, please call your pharmacy*   Lab Work: None ordered.  If you have labs (blood work) drawn today and your tests are completely normal, you will receive your results only by: MyChart Message (if you have MyChart) OR A paper copy in the mail If you have any lab test that is abnormal or we need to change your treatment, we will call you to review the results.   Testing/Procedures: None ordered.    Follow-Up: At [facility], you and your health needs are our priority.  As part of our continuing mission to provide you with exceptional heart care, we have created designated Provider Care Teams.  These Care Teams include your primary Cardiologist (physician) and Advanced Practice Providers (APPs -  Physician Assistants and Nurse Practitioners) who all work together to provide you with the care you need, when you need it.  We recommend signing up for the patient portal called "MyChart".  Sign up information is provided on this After Visit Summary.  MyChart is used to connect with patients for Virtual Visits (Telemedicine).  Patients are able to view lab/test results, encounter notes, upcoming appointments, etc.  Non-urgent messages can be sent to your provider as well.   To learn more about what you can do with MyChart, go to [email or web address].    Your next appointment:   6 months with Dr Graciela Husbands
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
## 2023-09-15 ENCOUNTER — Encounter: Payer: Self-pay | Admitting: Internal Medicine

## 2023-09-17 NOTE — Addendum Note (Signed)
Addended by: Elease Etienne A on: 09/17/2023 12:33 PM   Modules accepted: Orders

## 2023-09-17 NOTE — Progress Notes (Signed)
Remote ICD transmission.

## 2023-09-19 LAB — CUP PACEART INCLINIC DEVICE CHECK
Date Time Interrogation Session: 20250205143406
Implantable Lead Connection Status: 753985
Implantable Lead Implant Date: 20150318
Implantable Lead Location: 753860
Implantable Lead Model: 181
Implantable Lead Serial Number: 327195
Implantable Pulse Generator Implant Date: 20150318

## 2023-09-21 LAB — LAB REPORT - SCANNED: EGFR: 25

## 2023-10-14 NOTE — Progress Notes (Signed)
 No ICM remote transmission received for 10/11/2023 and next ICM transmission scheduled for 11/01/2023.

## 2023-11-04 NOTE — Progress Notes (Signed)
 No ICM remote transmission received for 11/01/2023 and next ICM transmission scheduled for 11/15/2023.

## 2023-11-12 ENCOUNTER — Ambulatory Visit (INDEPENDENT_AMBULATORY_CARE_PROVIDER_SITE_OTHER): Payer: PPO

## 2023-11-12 DIAGNOSIS — I255 Ischemic cardiomyopathy: Secondary | ICD-10-CM

## 2023-11-14 LAB — CUP PACEART REMOTE DEVICE CHECK
Battery Remaining Longevity: 16 mo
Battery Voltage: 2.89 V
Brady Statistic RV Percent Paced: 0.05 %
Date Time Interrogation Session: 20250411073727
HighPow Impedance: 84 Ohm
Implantable Lead Connection Status: 753985
Implantable Lead Implant Date: 20150318
Implantable Lead Location: 753860
Implantable Lead Model: 181
Implantable Lead Serial Number: 327195
Implantable Pulse Generator Implant Date: 20150318
Lead Channel Impedance Value: 665 Ohm
Lead Channel Impedance Value: 703 Ohm
Lead Channel Pacing Threshold Amplitude: 0.5 V
Lead Channel Pacing Threshold Pulse Width: 0.4 ms
Lead Channel Sensing Intrinsic Amplitude: 9.375 mV
Lead Channel Sensing Intrinsic Amplitude: 9.375 mV
Lead Channel Setting Pacing Amplitude: 2 V
Lead Channel Setting Pacing Pulse Width: 0.4 ms
Lead Channel Setting Sensing Sensitivity: 0.3 mV

## 2023-11-15 ENCOUNTER — Ambulatory Visit: Payer: Self-pay | Attending: Internal Medicine

## 2023-11-15 DIAGNOSIS — I5022 Chronic systolic (congestive) heart failure: Secondary | ICD-10-CM | POA: Diagnosis not present

## 2023-11-15 DIAGNOSIS — Z95810 Presence of automatic (implantable) cardiac defibrillator: Secondary | ICD-10-CM

## 2023-11-16 NOTE — Progress Notes (Signed)
 EPIC Encounter for ICM Monitoring  Patient Name: Richard Cook is a 68 y.o. male Date: 11/16/2023 Primary Care Physican: No primary care provider on file. Primary Cardiologist: Rodolfo Clan Electrophysiologist: Rodolfo Clan Nephrologist: [facility] 02/08/2023 Office Weight: 179 lbs 09/06/2023 Weight: 168 lbs (baseline)  11/16/2023 Weight: 176 lbs    Spoke with patient and heart failure questions reviewed.  Transmission results reviewed.  Pt has gained 13 pounds since starting Celebrex prescribed by PCP which resulted in decrease urine output.  Since stopping Celebrex on 4/13 he has lost 3 lbs.          Optivol thoracic impedance suggesting possible fluid accumulation starting 2/23 and returned close to baseline 4/13.   Fluid index remains above normal threshold since 3/10.   Prescribed: Furosemide 40 mg take 2 tablets (80 mg total) by mouth daily.      Labs: 09/20/2023 Creatinine 2.68, BUN 66, Potassium 5.6, Sodium 143, GFR 25 01/19/2022 Creatinine 3.57, BUN 53, Potassium 4.1, Sodium 141, GFR 18 10/29/2021 Creatinine 2.97, BUN 42, Potassium 3.8, Sodium 141, GFR 23 Care Everywhere 09/02/2021 Creatinine 3.09, BUN 55, Potassium 4.3, Sodium 141  Care Everywhere 07/17/2021 Creatinine 3.79, BUN 47, Potassium 3.8, Sodium 141, GFR 17 A complete set of results can be found in Results Review.   Recommendations:  Advised to call Nephrologist office to discuss what type of meds he can take for arthritis.  Urine output is back to normal after stopping Celebrex.     Follow-up plan: ICM clinic phone appointment on 12/20/2023.   91 day device clinic remote transmission 02/11/2024.     EP/Cardiology Office Visits:  Recall 03/06/2024 with Dr. Rodolfo Clan.      Copy of ICM check sent to Dr. Rodolfo Clan.   3 month ICM trend: 11/15/2023.    12-14 Month ICM trend:     Almyra Jain, RN 11/16/2023 8:21 AM

## 2023-11-21 ENCOUNTER — Other Ambulatory Visit: Payer: Self-pay | Admitting: Internal Medicine

## 2023-12-16 NOTE — Addendum Note (Signed)
 Addended by: Lott Rouleau A on: 12/16/2023 11:42 AM   Modules accepted: Orders

## 2023-12-16 NOTE — Progress Notes (Signed)
 Remote ICD transmission.

## 2023-12-20 ENCOUNTER — Encounter

## 2023-12-22 NOTE — Progress Notes (Signed)
 No ICM remote transmission received for 12/20/2023 and next ICM transmission scheduled for 01/10/2024.

## 2024-01-12 NOTE — Progress Notes (Signed)
 Difficulty reachin patient for monthly ICM remote follow up and not receiving monthly remote transmissions.  Patient disenrolled due patient is not actively participating in [facility] clinic.  Device clinic 91 day remote monitoring will continue per protocol.

## 2024-02-16 ENCOUNTER — Ambulatory Visit

## 2024-02-16 DIAGNOSIS — I255 Ischemic cardiomyopathy: Secondary | ICD-10-CM | POA: Diagnosis not present

## 2024-02-17 LAB — CUP PACEART REMOTE DEVICE CHECK
Battery Remaining Longevity: 13 mo
Battery Voltage: 2.88 V
Brady Statistic RV Percent Paced: 0.1 %
Date Time Interrogation Session: 20250716153143
HighPow Impedance: 86 Ohm
Implantable Lead Connection Status: 753985
Implantable Lead Implant Date: 20150318
Implantable Lead Location: 753860
Implantable Lead Model: 181
Implantable Lead Serial Number: 327195
Implantable Pulse Generator Implant Date: 20150318
Lead Channel Impedance Value: 779 Ohm
Lead Channel Impedance Value: 779 Ohm
Lead Channel Pacing Threshold Amplitude: 0.5 V
Lead Channel Pacing Threshold Pulse Width: 0.4 ms
Lead Channel Sensing Intrinsic Amplitude: 9.125 mV
Lead Channel Sensing Intrinsic Amplitude: 9.125 mV
Lead Channel Setting Pacing Amplitude: 2 V
Lead Channel Setting Pacing Pulse Width: 0.4 ms
Lead Channel Setting Sensing Sensitivity: 0.3 mV

## 2024-02-26 ENCOUNTER — Ambulatory Visit: Payer: Self-pay | Admitting: Cardiovascular Disease

## 2024-04-11 ENCOUNTER — Ambulatory Visit: Admitting: Student

## 2024-04-19 ENCOUNTER — Encounter: Admitting: Student

## 2024-05-09 NOTE — Progress Notes (Signed)
 Remote ICD Transmission

## 2024-05-12 ENCOUNTER — Encounter

## 2024-05-15 ENCOUNTER — Ambulatory Visit: Admitting: Cardiovascular Disease

## 2024-05-15 NOTE — Progress Notes (Deleted)
  Electrophysiology Office Note:    Date:  05/15/2024   ID:  Richard Cook, DOB 1955/08/08, MRN [id number]  PCP:  No primary care provider on file.   [facility] Providers Cardiologist:  None Electrophysiologist:  Elspeth Sage, MD { Click to update primary MD,subspecialty MD or APP then REFRESH:1}    Referring MD: No ref. provider found   History of Present Illness:    Richard Cook is a 68 y.o. male with a medical history significant for ***, referred for ***.     The patient has a Medtronic ICD implanted in 2015 for secondary prevention of ventricular tachycardia in the setting of ischemic heart disease.  He has had a history of MI and prior bypass surgery.  Amiodarone  was started due to recurrent ventricular tachycardia.  He underwent VT ablation March 2019 by Dr. Waddell.  He again had recurrent ventricular tachycardia March 2019 for many hours below his detection limit.  This occurred after he has stopped taking his medications for a few weeks.  He again underwent ablation of VT on May 2019 and has done well since.  His hospitalized in January 2022 with COVID complicated by hepatitis during which amiodarone  and Crestor  were held.  Discussed the use of AI scribe software for clinical note transcription with the patient, who gave verbal consent to proceed.  History of Present Illness          Today, ***  EKGs/Labs/Other Studies Reviewed Today:     Echocardiogram:  *** ***   Monitors:  *** day monitor ***  -- my interpretation ***  Stress testing:  *** ***  Advanced imaging:  *** ***  Cardiac catherization  *** ***  EKG:         Physical Exam:    VS:  There were no vitals taken for this visit.    Wt Readings from Last 3 Encounters:  09/08/23 175 lb 3.2 oz (79.5 kg)  02/08/23 179 lb 12.8 oz (81.6 kg)  12/18/22 166 lb (75.3 kg)     GEN: *** Well nourished, well developed in no acute distress CARDIAC: ***RRR, no  murmurs, rubs, gallops RESPIRATORY:  Normal work of breathing MUSCULOSKELETAL: *** edema    ASSESSMENT & PLAN:     Recurrent ventricular tachycardia Has undergone ablation, most recently 2019 Medtronic ICD in place Has been on amiodarone  in the past Has been on mexiletine in the past  Ischemic cardiomyopathy Status post CABG History of NSTEMI History of statin intolerance  History of iatrogenic hyperthyroidism ***  *** ***  *** ***    Signed, Eulas FORBES Furbish, MD  05/15/2024 6:21 AM    [facility]

## 2024-05-17 ENCOUNTER — Ambulatory Visit (INDEPENDENT_AMBULATORY_CARE_PROVIDER_SITE_OTHER)

## 2024-05-17 DIAGNOSIS — I255 Ischemic cardiomyopathy: Secondary | ICD-10-CM | POA: Diagnosis not present

## 2024-05-18 LAB — CUP PACEART REMOTE DEVICE CHECK
Battery Remaining Longevity: 10 mo
Battery Voltage: 2.87 V
Brady Statistic RV Percent Paced: 0.22 %
Date Time Interrogation Session: 20251015031707
HighPow Impedance: 90 Ohm
Implantable Lead Connection Status: 753985
Implantable Lead Implant Date: 20150318
Implantable Lead Location: 753860
Implantable Lead Model: 181
Implantable Lead Serial Number: 327195
Implantable Pulse Generator Implant Date: 20150318
Lead Channel Impedance Value: 722 Ohm
Lead Channel Impedance Value: 760 Ohm
Lead Channel Pacing Threshold Amplitude: 0.625 V
Lead Channel Pacing Threshold Pulse Width: 0.4 ms
Lead Channel Sensing Intrinsic Amplitude: 9.625 mV
Lead Channel Sensing Intrinsic Amplitude: 9.625 mV
Lead Channel Setting Pacing Amplitude: 2 V
Lead Channel Setting Pacing Pulse Width: 0.4 ms
Lead Channel Setting Sensing Sensitivity: 0.3 mV

## 2024-05-22 NOTE — Progress Notes (Signed)
 Remote ICD Transmission

## 2024-05-25 ENCOUNTER — Telehealth: Payer: Self-pay

## 2024-05-25 NOTE — Telephone Encounter (Signed)
 Pt called in stating that he had a dream that he got shocked. Pt sent in a transmission and was reviewed by Mandy(RN) and pt did not have any shocks or any episodes. Pt did not have any symptoms and verbalized understanding that it was his dream

## 2024-05-29 ENCOUNTER — Ambulatory Visit: Payer: Self-pay | Admitting: Cardiovascular Disease

## 2024-06-26 ENCOUNTER — Encounter: Payer: Self-pay | Admitting: Cardiovascular Disease

## 2024-06-26 ENCOUNTER — Ambulatory Visit: Attending: Cardiovascular Disease | Admitting: Cardiovascular Disease

## 2024-06-26 VITALS — BP 120/82 | HR 67 | Ht 70.0 in | Wt 159.0 lb

## 2024-06-26 DIAGNOSIS — I472 Ventricular tachycardia, unspecified: Secondary | ICD-10-CM

## 2024-06-26 DIAGNOSIS — I255 Ischemic cardiomyopathy: Secondary | ICD-10-CM | POA: Diagnosis not present

## 2024-06-26 DIAGNOSIS — I5022 Chronic systolic (congestive) heart failure: Secondary | ICD-10-CM | POA: Diagnosis not present

## 2024-06-26 DIAGNOSIS — Z95810 Presence of automatic (implantable) cardiac defibrillator: Secondary | ICD-10-CM | POA: Diagnosis not present

## 2024-06-26 LAB — CUP PACEART INCLINIC DEVICE CHECK
Date Time Interrogation Session: 20251124121259
HighPow Impedance: 82 Ohm
Implantable Lead Connection Status: 753985
Implantable Lead Implant Date: 20150318
Implantable Lead Location: 753860
Implantable Lead Model: 181
Implantable Lead Serial Number: 327195
Implantable Pulse Generator Implant Date: 20150318
Lead Channel Impedance Value: 703 Ohm
Lead Channel Pacing Threshold Amplitude: 0.5 V
Lead Channel Pacing Threshold Pulse Width: 0.4 ms
Lead Channel Sensing Intrinsic Amplitude: 10 mV

## 2024-06-26 NOTE — Progress Notes (Signed)
  Electrophysiology Office Note:    Date:  06/26/2024   ID:  Richard Cook, DOB 02-26-1956, MRN [id number]  PCP:  Rayna Olivia CROME, NP   [facility] Providers Cardiologist:  None Electrophysiologist:  Elspeth Sage, MD (Inactive)     Referring MD: No ref. provider found   History of Present Illness:    Richard Cook is a 68 y.o. male with a medical history significant for recurrent ventricular tachycardia, ischemic cardiomyopathy, prior MI, referred for device and rhythm management.     History of Present Illness He has a Medtronic ICD implanted in 2015 for VT.  He has a history of ischemic cardiomyopathy and prior MI.  He has had recurrence of VT in 2017, March 2018 or March 2019.  He had a repeat VT ablation in May 2019.  VT was not inducible during that procedure.  He has not had any recurrent VT therapy since the ablation.  He was hospitalized in January 2022 with COVID and developed hepatitis.  Amiodarone  and Crestor  were held.  The amiodarone  was not resumed, and because he has not had any recurrence of VT, he has remained off of it.         Today, he reports he is well and has no complaints.he has no device related complaints -- no new tenderness, drainage, redness.   EKGs/Labs/Other Studies Reviewed Today:     Echocardiogram:  TTE January 2023 LVEF 30 to 35%.  Grade 1 diastolic dysfunction.  Normal RV function.  Moderate left atrial dilation, mild right atrial dilation.    EKG:   EKG Interpretation Date/Time:  Monday June 26 2024 11:53:48 EST Ventricular Rate:  67 PR Interval:  206 QRS Duration:  94 QT Interval:  384 QTC Calculation: 405 R Axis:   117  Text Interpretation: Normal sinus rhythm Right axis deviation Cannot rule out Anterior infarct , age undetermined T wave abnormality, consider inferolateral ischemia When compared with ECG of 08-Feb-2023 11:35, QRS axis Shifted right Inverted T waves have replaced nonspecific T wave  abnormality in Inferior leads Inverted T waves have replaced nonspecific T wave abnormality in Lateral leads Confirmed by Nancey Scotts [phone]) on 06/26/2024 12:05:30 PM     Physical Exam:    VS:  BP 120/82 (BP Location: Right Arm, Patient Position: Sitting, Cuff Size: Large)   Pulse 67   Ht 5' 10 (1.778 m)   Wt 159 lb (72.1 kg)   SpO2 98%   BMI 22.81 kg/m     Wt Readings from Last 3 Encounters:  06/26/24 159 lb (72.1 kg)  09/08/23 175 lb 3.2 oz (79.5 kg)  02/08/23 179 lb 12.8 oz (81.6 kg)     GEN: Well nourished, well developed in no acute distress CARDIAC: RRR, no murmurs, rubs, gallops RESPIRATORY:  Normal work of breathing MUSCULOSKELETAL: no edema    ASSESSMENT & PLAN:     VT Status post 2 VT ablations, most recently May 2019 No recurrence of VT since the ablation  Ischemic cardiomyopathy Continue furosemide  80 milligrams Will refer to general cardiology clinic for management of CHF    Signed, Scotts FORBES Nancey, MD  06/26/2024 12:07 PM    [facility]

## 2024-06-26 NOTE — Patient Instructions (Signed)
 Medication Instructions:  Your physician recommends that you continue on your current medications as directed. Please refer to the Current Medication list given to you today.  *If you need a refill on your cardiac medications before your next appointment, please call your pharmacy*  Lab Work: None ordered.  If you have labs (blood work) drawn today and your tests are completely normal, you will receive your results only by: MyChart Message (if you have MyChart) OR A paper copy in the mail If you have any lab test that is abnormal or we need to change your treatment, we will call you to review the results.  Testing/Procedures: None ordered.   Follow-Up: At [facility], you and your health needs are our priority.  As part of our continuing mission to provide you with exceptional heart care, our providers are all part of one team.  This team includes your primary Cardiologist (physician) and Advanced Practice Providers or APPs (Physician Assistants and Nurse Practitioners) who all work together to provide you with the care you need, when you need it.  Your next appointment:   12 month(s)  Provider:   Eulas Furbish, MDWe recommend signing up for the patient portal called MyChart.  Sign up information is provided on this After Visit Summary.  MyChart is used to connect with patients for Virtual Visits (Telemedicine).  Patients are able to view lab/test results, encounter notes, upcoming appointments, etc.  Non-urgent messages can be sent to your provider as well.   To learn more about what you can do with MyChart, go to [email or web address].   Other Instructions Referral to general cardiology in about 6 months

## 2024-08-11 ENCOUNTER — Encounter

## 2024-08-16 ENCOUNTER — Ambulatory Visit

## 2024-08-16 DIAGNOSIS — I255 Ischemic cardiomyopathy: Secondary | ICD-10-CM

## 2024-08-21 LAB — CUP PACEART REMOTE DEVICE CHECK
Battery Remaining Longevity: 7 mo
Battery Voltage: 2.86 V
Brady Statistic RV Percent Paced: 0.09 %
Date Time Interrogation Session: 20260118135835
HighPow Impedance: 85 Ohm
Implantable Lead Connection Status: 753985
Implantable Lead Implant Date: 20150318
Implantable Lead Location: 753860
Implantable Lead Model: 181
Implantable Lead Serial Number: 327195
Implantable Pulse Generator Implant Date: 20150318
Lead Channel Impedance Value: 722 Ohm
Lead Channel Impedance Value: 722 Ohm
Lead Channel Pacing Threshold Amplitude: 0.5 V
Lead Channel Pacing Threshold Pulse Width: 0.4 ms
Lead Channel Sensing Intrinsic Amplitude: 9 mV
Lead Channel Sensing Intrinsic Amplitude: 9 mV
Lead Channel Setting Pacing Amplitude: 2 V
Lead Channel Setting Pacing Pulse Width: 0.4 ms
Lead Channel Setting Sensing Sensitivity: 0.3 mV

## 2024-08-21 NOTE — Progress Notes (Signed)
 Remote ICD Transmission

## 2024-08-26 ENCOUNTER — Ambulatory Visit: Payer: Self-pay | Admitting: Cardiovascular Disease

## 2024-10-27 ENCOUNTER — Ambulatory Visit: Admitting: Student in an Organized Health Care Education/Training Program

## 2024-11-10 ENCOUNTER — Encounter

## 2024-11-15 ENCOUNTER — Encounter

## 2025-02-09 ENCOUNTER — Encounter

## 2025-02-14 ENCOUNTER — Encounter

## 2025-05-11 ENCOUNTER — Encounter

## 2025-05-16 ENCOUNTER — Encounter

## 2025-08-10 ENCOUNTER — Encounter
